# Patient Record
Sex: Male | Born: 1940 | Race: White | Hispanic: No | Marital: Married | Smoking: Never smoker
Health system: Southern US, Community
[De-identification: ages and names within clinical notes are randomized; demographics above are authoritative.]

## PROBLEM LIST (undated history)

## (undated) DIAGNOSIS — I480 Paroxysmal atrial fibrillation: Secondary | ICD-10-CM

## (undated) DIAGNOSIS — Z95810 Presence of automatic (implantable) cardiac defibrillator: Secondary | ICD-10-CM

## (undated) DIAGNOSIS — I255 Ischemic cardiomyopathy: Secondary | ICD-10-CM

## (undated) DIAGNOSIS — I5022 Chronic systolic (congestive) heart failure: Secondary | ICD-10-CM

## (undated) DIAGNOSIS — R0600 Dyspnea, unspecified: Secondary | ICD-10-CM

## (undated) DIAGNOSIS — E785 Hyperlipidemia, unspecified: Secondary | ICD-10-CM

## (undated) DIAGNOSIS — N189 Chronic kidney disease, unspecified: Secondary | ICD-10-CM

## (undated) DIAGNOSIS — I499 Cardiac arrhythmia, unspecified: Secondary | ICD-10-CM

## (undated) DIAGNOSIS — I472 Ventricular tachycardia, unspecified: Secondary | ICD-10-CM

## (undated) DIAGNOSIS — I209 Angina pectoris, unspecified: Secondary | ICD-10-CM

## (undated) DIAGNOSIS — M79606 Pain in leg, unspecified: Secondary | ICD-10-CM

## (undated) DIAGNOSIS — N400 Enlarged prostate without lower urinary tract symptoms: Secondary | ICD-10-CM

## (undated) DIAGNOSIS — I2510 Atherosclerotic heart disease of native coronary artery without angina pectoris: Secondary | ICD-10-CM

## (undated) DIAGNOSIS — M1990 Unspecified osteoarthritis, unspecified site: Secondary | ICD-10-CM

## (undated) DIAGNOSIS — I509 Heart failure, unspecified: Secondary | ICD-10-CM

## (undated) HISTORY — DX: Heart failure, unspecified: I50.9

## (undated) HISTORY — DX: Hyperlipidemia, unspecified: E78.5

## (undated) HISTORY — PX: APPENDECTOMY: SHX54

## (undated) HISTORY — DX: Pain in leg, unspecified: M79.606

## (undated) HISTORY — DX: Ventricular tachycardia: I47.2

## (undated) HISTORY — DX: Paroxysmal atrial fibrillation: I48.0

## (undated) HISTORY — PX: CORONARY ANGIOPLASTY: SHX604

## (undated) HISTORY — DX: Ischemic cardiomyopathy: I25.5

## (undated) HISTORY — PX: KNEE ARTHROSCOPY: SUR90

## (undated) HISTORY — DX: Atherosclerotic heart disease of native coronary artery without angina pectoris: I25.10

## (undated) HISTORY — DX: Benign prostatic hyperplasia without lower urinary tract symptoms: N40.0

## (undated) HISTORY — DX: Ventricular tachycardia, unspecified: I47.20

## (undated) HISTORY — DX: Chronic systolic (congestive) heart failure: I50.22

## (undated) HISTORY — PX: OTHER SURGICAL HISTORY: SHX169

---

## 1968-09-07 HISTORY — PX: APPENDECTOMY: SHX54

## 1999-05-15 ENCOUNTER — Ambulatory Visit (HOSPITAL_BASED_OUTPATIENT_CLINIC_OR_DEPARTMENT_OTHER): Admission: RE | Admit: 1999-05-15 | Discharge: 1999-05-15 | Payer: Self-pay | Admitting: *Deleted

## 1999-07-01 ENCOUNTER — Emergency Department (HOSPITAL_COMMUNITY): Admission: EM | Admit: 1999-07-01 | Discharge: 1999-07-01 | Payer: Self-pay | Admitting: Emergency Medicine

## 1999-10-28 ENCOUNTER — Encounter: Payer: Self-pay | Admitting: Cardiovascular Disease

## 1999-10-28 ENCOUNTER — Inpatient Hospital Stay (HOSPITAL_COMMUNITY): Admission: EM | Admit: 1999-10-28 | Discharge: 1999-10-31 | Payer: Self-pay | Admitting: Emergency Medicine

## 1999-11-02 ENCOUNTER — Inpatient Hospital Stay (HOSPITAL_COMMUNITY): Admission: EM | Admit: 1999-11-02 | Discharge: 1999-11-07 | Payer: Self-pay | Admitting: Emergency Medicine

## 1999-11-02 ENCOUNTER — Encounter: Payer: Self-pay | Admitting: Emergency Medicine

## 1999-11-05 ENCOUNTER — Encounter: Payer: Self-pay | Admitting: Cardiology

## 2000-12-10 ENCOUNTER — Encounter: Payer: Self-pay | Admitting: Cardiovascular Disease

## 2000-12-10 ENCOUNTER — Ambulatory Visit (HOSPITAL_COMMUNITY): Admission: RE | Admit: 2000-12-10 | Discharge: 2000-12-10 | Payer: Self-pay | Admitting: Cardiovascular Disease

## 2001-05-24 ENCOUNTER — Ambulatory Visit (HOSPITAL_BASED_OUTPATIENT_CLINIC_OR_DEPARTMENT_OTHER): Admission: RE | Admit: 2001-05-24 | Discharge: 2001-05-24 | Payer: Self-pay | Admitting: Orthopedic Surgery

## 2002-10-31 ENCOUNTER — Ambulatory Visit (HOSPITAL_COMMUNITY): Admission: RE | Admit: 2002-10-31 | Discharge: 2002-10-31 | Payer: Self-pay | Admitting: Cardiovascular Disease

## 2002-11-02 ENCOUNTER — Emergency Department (HOSPITAL_COMMUNITY): Admission: EM | Admit: 2002-11-02 | Discharge: 2002-11-02 | Payer: Self-pay | Admitting: Emergency Medicine

## 2002-11-07 ENCOUNTER — Inpatient Hospital Stay (HOSPITAL_COMMUNITY): Admission: AD | Admit: 2002-11-07 | Discharge: 2002-11-08 | Payer: Self-pay | Admitting: Cardiovascular Disease

## 2002-11-20 ENCOUNTER — Ambulatory Visit (HOSPITAL_COMMUNITY): Admission: RE | Admit: 2002-11-20 | Discharge: 2002-11-21 | Payer: Self-pay | Admitting: Internal Medicine

## 2002-11-20 ENCOUNTER — Encounter: Payer: Self-pay | Admitting: Internal Medicine

## 2002-11-21 ENCOUNTER — Encounter: Payer: Self-pay | Admitting: Internal Medicine

## 2003-07-02 ENCOUNTER — Inpatient Hospital Stay (HOSPITAL_COMMUNITY): Admission: EM | Admit: 2003-07-02 | Discharge: 2003-07-04 | Payer: Self-pay | Admitting: Emergency Medicine

## 2003-07-02 ENCOUNTER — Encounter: Payer: Self-pay | Admitting: Urology

## 2003-09-08 HISTORY — PX: CARDIAC CATHETERIZATION: SHX172

## 2003-10-19 ENCOUNTER — Ambulatory Visit (HOSPITAL_COMMUNITY): Admission: RE | Admit: 2003-10-19 | Discharge: 2003-10-20 | Payer: Self-pay | Admitting: Cardiovascular Disease

## 2004-06-23 ENCOUNTER — Ambulatory Visit (HOSPITAL_COMMUNITY): Admission: RE | Admit: 2004-06-23 | Discharge: 2004-06-23 | Payer: Self-pay | Admitting: Cardiovascular Disease

## 2004-10-20 ENCOUNTER — Ambulatory Visit: Payer: Self-pay | Admitting: Internal Medicine

## 2005-02-23 ENCOUNTER — Ambulatory Visit: Payer: Self-pay

## 2005-08-12 ENCOUNTER — Ambulatory Visit: Payer: Self-pay

## 2005-12-15 ENCOUNTER — Ambulatory Visit: Payer: Self-pay | Admitting: Neurology

## 2006-03-23 ENCOUNTER — Ambulatory Visit: Payer: Self-pay | Admitting: Internal Medicine

## 2006-09-20 ENCOUNTER — Ambulatory Visit: Payer: Self-pay | Admitting: Internal Medicine

## 2006-11-22 ENCOUNTER — Ambulatory Visit: Payer: Self-pay

## 2007-04-27 ENCOUNTER — Encounter (INDEPENDENT_AMBULATORY_CARE_PROVIDER_SITE_OTHER): Payer: Self-pay | Admitting: Urology

## 2009-01-08 ENCOUNTER — Encounter: Payer: Self-pay | Admitting: Internal Medicine

## 2009-01-11 ENCOUNTER — Telehealth (INDEPENDENT_AMBULATORY_CARE_PROVIDER_SITE_OTHER): Payer: Self-pay | Admitting: *Deleted

## 2009-01-14 DIAGNOSIS — I10 Essential (primary) hypertension: Secondary | ICD-10-CM | POA: Insufficient documentation

## 2009-01-15 ENCOUNTER — Ambulatory Visit: Payer: Self-pay

## 2009-01-15 ENCOUNTER — Ambulatory Visit: Payer: Self-pay | Admitting: Internal Medicine

## 2009-01-15 DIAGNOSIS — I495 Sick sinus syndrome: Secondary | ICD-10-CM

## 2009-01-23 ENCOUNTER — Telehealth: Payer: Self-pay | Admitting: Internal Medicine

## 2009-01-25 ENCOUNTER — Encounter: Payer: Self-pay | Admitting: Internal Medicine

## 2009-02-08 ENCOUNTER — Encounter: Payer: Self-pay | Admitting: Internal Medicine

## 2009-02-14 ENCOUNTER — Encounter: Payer: Self-pay | Admitting: Internal Medicine

## 2009-02-14 ENCOUNTER — Ambulatory Visit: Payer: Self-pay

## 2009-02-14 ENCOUNTER — Ambulatory Visit: Payer: Self-pay | Admitting: Internal Medicine

## 2009-02-14 LAB — CONVERTED CEMR LAB
Basophils Absolute: 0 10*3/uL (ref 0.0–0.1)
Calcium: 9.2 mg/dL (ref 8.4–10.5)
Eosinophils Relative: 2.7 % (ref 0.0–5.0)
GFR calc non Af Amer: 88.85 mL/min (ref >60)
Glucose, Bld: 91 mg/dL (ref 70–99)
Hemoglobin: 15 g/dL (ref 13.0–17.0)
INR: 2.1 — ABNORMAL HIGH (ref 0.8–1.0)
Lymphocytes Relative: 21.7 % (ref 12.0–46.0)
Monocytes Relative: 6.9 % (ref 3.0–12.0)
Neutro Abs: 3.4 10*3/uL (ref 1.4–7.7)
Platelets: 220 10*3/uL (ref 150.0–400.0)
Potassium: 4.3 meq/L (ref 3.5–5.1)
RDW: 12.5 % (ref 11.5–14.6)
Sodium: 140 meq/L (ref 135–145)
WBC: 4.8 10*3/uL (ref 4.5–10.5)
aPTT: 34 s — ABNORMAL HIGH (ref 21.7–28.8)

## 2009-02-15 ENCOUNTER — Inpatient Hospital Stay (HOSPITAL_COMMUNITY): Admission: RE | Admit: 2009-02-15 | Discharge: 2009-02-16 | Payer: Self-pay | Admitting: Internal Medicine

## 2009-02-15 ENCOUNTER — Ambulatory Visit: Payer: Self-pay | Admitting: Internal Medicine

## 2009-02-16 ENCOUNTER — Encounter: Payer: Self-pay | Admitting: Internal Medicine

## 2009-02-19 ENCOUNTER — Ambulatory Visit: Payer: Self-pay | Admitting: Internal Medicine

## 2009-02-26 ENCOUNTER — Ambulatory Visit: Payer: Self-pay | Admitting: Internal Medicine

## 2009-02-26 ENCOUNTER — Encounter: Payer: Self-pay | Admitting: Internal Medicine

## 2009-02-26 ENCOUNTER — Ambulatory Visit: Payer: Self-pay

## 2009-04-02 ENCOUNTER — Ambulatory Visit: Payer: Self-pay | Admitting: Internal Medicine

## 2009-06-04 ENCOUNTER — Encounter: Payer: Self-pay | Admitting: Internal Medicine

## 2009-06-04 ENCOUNTER — Ambulatory Visit: Payer: Self-pay | Admitting: Internal Medicine

## 2009-07-18 ENCOUNTER — Encounter (INDEPENDENT_AMBULATORY_CARE_PROVIDER_SITE_OTHER): Payer: Self-pay | Admitting: *Deleted

## 2009-08-20 ENCOUNTER — Encounter: Payer: Self-pay | Admitting: Internal Medicine

## 2009-08-20 ENCOUNTER — Ambulatory Visit: Payer: Self-pay | Admitting: Cardiovascular Disease

## 2009-08-20 ENCOUNTER — Ambulatory Visit (HOSPITAL_COMMUNITY): Admission: RE | Admit: 2009-08-20 | Discharge: 2009-08-20 | Payer: Self-pay | Admitting: Internal Medicine

## 2009-08-20 ENCOUNTER — Ambulatory Visit: Payer: Self-pay | Admitting: Internal Medicine

## 2010-02-25 ENCOUNTER — Ambulatory Visit: Payer: Self-pay | Admitting: Internal Medicine

## 2010-02-25 ENCOUNTER — Ambulatory Visit (HOSPITAL_COMMUNITY): Admission: RE | Admit: 2010-02-25 | Discharge: 2010-02-25 | Payer: Self-pay | Admitting: Internal Medicine

## 2010-02-25 ENCOUNTER — Ambulatory Visit: Payer: Self-pay

## 2010-02-25 ENCOUNTER — Encounter: Payer: Self-pay | Admitting: Internal Medicine

## 2010-02-25 DIAGNOSIS — I4891 Unspecified atrial fibrillation: Secondary | ICD-10-CM | POA: Insufficient documentation

## 2010-03-18 ENCOUNTER — Telehealth: Payer: Self-pay | Admitting: Internal Medicine

## 2010-05-08 ENCOUNTER — Ambulatory Visit: Payer: Self-pay | Admitting: Cardiovascular Disease

## 2010-06-04 ENCOUNTER — Ambulatory Visit: Payer: Self-pay | Admitting: Cardiovascular Disease

## 2010-06-26 ENCOUNTER — Ambulatory Visit: Payer: Self-pay | Admitting: Cardiovascular Disease

## 2010-07-21 ENCOUNTER — Ambulatory Visit: Payer: Self-pay | Admitting: Cardiovascular Disease

## 2010-07-21 ENCOUNTER — Encounter: Payer: Self-pay | Admitting: Internal Medicine

## 2010-08-18 ENCOUNTER — Encounter: Payer: Self-pay | Admitting: Internal Medicine

## 2010-08-19 ENCOUNTER — Ambulatory Visit: Payer: Self-pay | Admitting: Internal Medicine

## 2010-08-19 ENCOUNTER — Encounter: Payer: Self-pay | Admitting: Internal Medicine

## 2010-08-19 ENCOUNTER — Ambulatory Visit: Payer: Self-pay | Admitting: Cardiology

## 2010-09-02 ENCOUNTER — Ambulatory Visit: Payer: Self-pay | Admitting: Internal Medicine

## 2010-09-12 ENCOUNTER — Telehealth: Payer: Self-pay | Admitting: Internal Medicine

## 2010-10-02 ENCOUNTER — Ambulatory Visit: Payer: Self-pay | Admitting: Cardiology

## 2010-10-07 NOTE — Assessment & Plan Note (Signed)
Summary: 9 month rov/sl pt to have echo @ 9:30/jml   Referring Provider:  Delane Ginger   History of Present Illness: Mr. 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 He was status post ICD implantation initially as part of a Master protocol.  initially following ICD-CRT upgrade he did better. More recently he has noted some episodes of fatigue. There has been some peripheral edema as well as "puffiness in his face." There has been no chest pain.  He has been involved in a lipid trial at [facility]. The trial has been terminated by the [employer]. because of lack of benefit    Current Medications (verified): 1)  Lisinopril 10 Mg Tabs (Lisinopril) .... Take One Half Tab Once Daily 2)  Carvedilol 12.5 Mg Tabs (Carvedilol) .... Take One Tablet Two Times A Day 3)  Coumadin 2 Mg Tabs (Warfarin Sodium) .... Take As Directed 4)  Zocor 40 Mg Tabs (Simvastatin) .... Take One Tablet Qd 5)  Adult Aspirin Ec Low Strength 81 Mg Tbec (Aspirin) .... Take One Tablet Once Daily 6)  Glucosamine Chondroitin Adv  Tabs (Misc Natural Products) .... Take One Tablet Once Daily 7)  Multivitamins  Tabs (Multiple Vitamin) .... Take One Tablet Once Daily 8)  Fish Oil  Oil (Fish Oil) .... Take Once Daily 9)  Cvs Daily Probiotic  Caps (Probiotic Product) .... Once Daily 10)  Jalyn 0.5-0.4 Mg Caps (Dutasteride-Tamsulosin Hcl) .... One Daily  Allergies (verified): 1)  ! Benadryl 2)  ! Sudafed  Past History:  Past Medical History: Last updated: 01/15/2009  1.  History of coronary artery disease.  He is status post PTCA and stenting      of his left anterior descending artery.  He is status post restenting of      his LAD in February 2005.  2.  Congestive heart failure due to an anterior wall myocardial infarction.  3.  History of ICD placement.  4.  Hypertension.  5.  Hyperlipidemia.  6.  Prostatic enlargement.  7. right ear deafness  8.  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  Alcohol Use - no Married  Regular Exercise - no  Vital Signs:  Patient profile:   70 year old male Height:      72 inches Weight:      215 pounds Pulse rate:   62 / minute Pulse rhythm:   regular BP sitting:   130 / 79  (right arm) Cuff size:   regular  Vitals Entered By: Judithe Modest CMA (February 25, 2010 10:41 AM)  Physical Exam  General:  The patient was alert and oriented in no acute distress. HEENT Normal.  Neck veins were flat, carotids were brisk.  Lungs were clear.  Heart sounds were regular without murmurs or gallops.  Abdomen was soft with active bowel sounds. There is no clubbing cyanosis; 1+ edema Skin Warm and dry     ICD Specifications Following MD:  Sherryl Manges, MD     Referring MD:  [facility] ICD Vendor:  Medtronic     ICD Model Number:  [phone]     ICD Serial Number:  [id number]  ICD DOI:  02/15/2009     ICD Implanting MD:  Sherryl Manges, MD Research Study Name aCRT  Lead 1:    Location: RA     DOI: 02/15/2009     Model #: [id number]     Serial #: [id number]     Status: active Lead 2:    Location: RV     DOI: 11/20/2002     Model #: 6948     Serial #: [id number]     Status: active Lead 3:    Location: LV     DOI: 02/15/2009     Model #: 0938     Serial #: [id number]     Status: active  Indications::  CM   ICD Follow Up Remote Check?  No Battery Voltage:  3.14 V     Charge Time:  9.1 seconds     Underlying rhythm:  SR ICD Dependent:  No       ICD Device Measurements Atrium:  Amplitude: 2.0 mV, Impedance: 475 ohms, Threshold: 0.5 V at 0.4  msec Right Ventricle:  Amplitude: 11.4 mV, Impedance: 437 ohms, Threshold: 1.0 V at 0.4 msec Left Ventricle:  Impedance: 646 ohms, Threshold: 0.625 V at 0.4 msec Shock Impedance: 46/60 ohms   Episodes MS Episodes:  0     Shock:  0     ATP:  0     Nonsustained:  0     Atrial Pacing:  74.4%     Ventricular Pacing:  98.4%  Brady Parameters Mode DDD     Lower Rate Limit:  60     Upper Rate Limit 130 PAV 170     Sensed AV Delay:  110  Tachy Zones VF:  188     VT:  OFF     VT1:  OFF     Tech Comments:  Checked by industry as part of aCRT protocol. Gypsy Balsam RN BSN  February 25, 2010 10:56 AM   Impression & Recommendations:  Problem # 1:  HYPERLIPIDEMIA (ICD-272.4) the patient's trial [facility] has been terminated. We will discontinue his Niaspan and Zocor and resume Lipitor 40 mg The following medications were removed from the medication list:    Niaspan 1000 Mg Cr-tabs (Niacin (antihyperlipidemic)) .[name]... Take one tablet two times a day His updated medication list for this problem includes:    Lipitor 40 Mg Tabs (Atorvastatin calcium) .[name]... 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 He is on Coumadin. At some point we should consider discontinuing his aspirin  Problem # 4:  SYSTOLIC HEART FAILURE, CHRONIC (ICD-428.22) his congestive status is much improved at baseline although there's been a recent recurrence of edema. Interestingly his optivol index demonstrates no fluid accumulation in the setting of his edema. We will plan to put him on a low dose of diuretic for one weeks time. He is to let us know how it is his feeling at the end of this. We'll otherwise plan to see him again in 3  month His updated medication list for this problem includes:    Lisinopril 10 Mg Tabs (Lisinopril) .[name]... Take one half tab once daily    Carvedilol 12.5 Mg Tabs (Carvedilol) .[name]... Take one tablet two times  a day    Coumadin 2 Mg Tabs (Warfarin sodium) .[name]... Take as directed    Adult Aspirin Ec Low Strength 81 Mg Tbec (Aspirin) .[name]... Take one tablet once daily    Furosemide 20 Mg Tabs (Furosemide) .[name]... Take 1 tablet by mouth daily for 3 days then 1 tablet every other day for 6 days then as needed  Patient Instructions: 1)  Your physician has recommended you make the following change in your medication: Take Furosemide 20mg  1 tablet daily for 3 days then 1 tablet every other day for 6 days then as needed. 2)  Your physician wants you to follow-up in: 6 months with Dr Graciela Husbands.  You will receive a reminder letter in the mail two months in advance. If you don't receive a letter, please call our office to schedule the follow-up appointment. 3)  Call us in 10 days to let us know how you are doing. Prescriptions: LIPITOR 40 MG TABS (ATORVASTATIN CALCIUM) Take 1 tablet by mouth once a day  #90 x 3   Entered by:   Optometrist BSN   Authorized by:   Nathen May, MD, [facility]   Signed by:   Gypsy Balsam RN BSN on 02/25/2010   Method used:   Electronically to        [facility]* (retail)             ,          Ph: [phone]       Fax: [phone]   RxID:   [id number] COUMADIN 2 MG TABS (WARFARIN SODIUM) take as directed  #30 x 2   Entered by:   Optometrist BSN   Authorized by:   Nathen May, MD, [facility]   Signed by:   Gypsy Balsam RN BSN on 02/25/2010   Method used:   Electronically to        [facility]* (retail)       [address] [place], [place]  [address]       Ph: [phone] or [phone]       Fax: [phone]   RxID:   [id number] FUROSEMIDE 20 MG TABS (FUROSEMIDE) Take 1 tablet by mouth  daily for 3 days then 1 tablet every other day for 6 days then as needed  #30 x 0   Entered by:   Optometrist BSN   Authorized by:   Nathen May, MD, [facility]   Signed by:   Gypsy Balsam RN BSN on 02/25/2010   Method used:   Electronically to        [facility]* (retail)       [address] [place], [place]  [address]       Ph: [phone] or [phone]       Fax: [phone]   RxID:   [id number] LIPITOR 40 MG TABS (ATORVASTATIN CALCIUM) Take 1 tablet by mouth once a day  #30 x 11   Entered by:   Optometrist BSN   Authorized by:   Nathen May, MD, [facility]   Signed by:   Gypsy Balsam RN BSN on 02/25/2010   Method used:   Electronically to        [facility]* (retail)       [address] [place], [place]  [address]       Ph: [phone] or [phone]       Fax: [phone]   RxID:   [id number]

## 2010-10-07 NOTE — Progress Notes (Signed)
Summary: pt would like to give an update  Phone Note Call from Patient Call back at Home Phone [phone]   Caller: Patient Reason for Call: Talk to Nurse, Talk to Doctor Summary of Call: pt in study and was to call office in 10days to give an update  Initial call taken by: Omer Jack,  March 18, 2010 11:18 AM  Follow-up for Phone Call        FYI                                                                                                  PER PT  CALLED IN WITH UPDATE FEELS FINE WEIGHT GOOD NO SOB  TAKING FUROSEMIDE 20 MG every other day .AT THIS TIME. Follow-up by: Scherrie Bateman, LPN,  March 18, 2010 11:56 AM  Additional Follow-up for Phone Call Additional follow up Details #1::        greatr Additional Follow-up by: Nathen May, MD, [facility],  March 20, 2010 9:03 AM

## 2010-10-09 NOTE — Progress Notes (Signed)
Summary: CHEST PAIN AND QUESTION RE DEVICE  Phone Note Call from Patient Call back at Home Phone [phone]   Caller: Patient Reason for Call: Talk to Nurse Summary of Call: PT HAS HAD CHEST PAIN AND PAIN IN THE RIGHT ARM. PT WOULD LIKE TO KNOW IF SOMEONE CAN CHECK HIS MONITOR TO SEE IF SOMETHING IS GOING ON. Initial call taken by: Roe Coombs,  September 12, 2010 4:24 PM  Follow-up for Phone Call        spoke w/pt--wanted to know if there had been any episodes on device.  no alerts thru carelink.  informed pt if chest pn continues needs to be seen by primary cardiologist (Dr Elease Hashimoto).  pt agrees. Vella Kohler  September 12, 2010 5:00 PM  Additional Follow-up for Phone Call Additional follow up Details #1::        agree Additional Follow-up by: Nathen May, MD, [facility],  September 15, 2010 1:11 PM

## 2010-10-09 NOTE — Progress Notes (Signed)
  Phone Note Call from Patient Call back at [facility] Phone [phone]   Caller: Patient Reason for Call: Talk to Nurse Summary of Call: PT HAS HAD CHEST PAIN AND PAIN IN THE RIGHT ARM. PT WOULD LIKE TO KNOW IF SOMEONE CAN CHECK HIS MONITOR TO SEE IF SOMETHING IS GOING ON.  Initial call taken by: Roe Coombs,  September 12, 2010 4:23 PM

## 2010-10-09 NOTE — Letter (Signed)
Summary: [facility] Progress Note   [facility] Progress Note   Imported By: Roderic Ovens 08/19/2010 15:47:55  _____________________________________________________________________  External Attachment:    Type:   Image     Comment:   External Document

## 2010-10-09 NOTE — Miscellaneous (Signed)
  Clinical Lists Changes  Observations: Added new observation of NUCLEAR NOS: - Left ventricle: The cavity size was mildly dilated. Systolic       function was moderately to severely reduced. The estimated       ejection fraction was in the range of 30% to 35%. There is severe       hypokinesis and scarring of the anteroseptal myocardium. There is       hypokinesis of the inferoseptal myocardium. There is akinesis of       the apical myocardium. There is hypokinesis of the anterior and       inferior myocardium. Doppler parameters are consistent with       abnormal left ventricular relaxation (grade 1 diastolic       dysfunction).     - Aortic valve: Mild regurgitation.     - Mitral valve: Mild regurgitation.     - Left atrium: The atrium was moderately dilated.     - Right atrium: The atrium was mildly dilated. (02/25/2010 14:36)      Nuclear Study  Procedure date:  02/25/2010  Findings:      - Left ventricle: The cavity size was mildly dilated. Systolic       function was moderately to severely reduced. The estimated       ejection fraction was in the range of 30% to 35%. There is severe       hypokinesis and scarring of the anteroseptal myocardium. There is       hypokinesis of the inferoseptal myocardium. There is akinesis of       the apical myocardium. There is hypokinesis of the anterior and       inferior myocardium. Doppler parameters are consistent with       abnormal left ventricular relaxation (grade 1 diastolic       dysfunction).     - Aortic valve: Mild regurgitation.     - Mitral valve: Mild regurgitation.     - Left atrium: The atrium was moderately dilated.     - Right atrium: The atrium was mildly dilated.

## 2010-10-09 NOTE — Assessment & Plan Note (Signed)
Summary: icd check/medtronic   Visit Type:  ICD-Medtronic Referring Ksenia Kunz:  Delane Ginger  CC:  no complaints.  History of Present Illness: Mr. 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 He was status post ICD implantation initially as part of a Master protocol.  initially following ICD-CRT upgrade he did better.His currently part of the a  CRT trial.  he has been doing really pretty well from a functional point of view. The patient denies chest pain, edema or palpitations; there is shortness of breath at high altitudes and high exertion   Problems Prior to Update: 1)  Atrial Fibrillation  (ICD-427.31) 2)  Implantable Defibrillator Crt-mdt  (ICD-V45.02) 3)  Sinus Bradycardia  (ICD-427.81) 4)  Cardiomyopathy, Ischemic  (ICD-414.8) 5)  Systolic Heart Failure, Chronic  (ICD-428.22) 6)  Hyperlipidemia  (ICD-272.4) 7)  Essential Hypertension, Benign  (ICD-401.1)  Current Medications (verified): 1)  Lisinopril 10 Mg Tabs (Lisinopril) .... Take One Half Tab Once Daily 2)  Carvedilol 12.5 Mg Tabs (Carvedilol) .... Take One Tablet Two Times A Day 3)  Coumadin 2 Mg Tabs (Warfarin Sodium) .... Take As Directed 4)  Lipitor 40 Mg Tabs (Atorvastatin Calcium) .... Take 1 Tablet By Mouth Once A Day 5)  Adult Aspirin Ec Low Strength 81 Mg Tbec (Aspirin) .... Take One Tablet Once Daily 6)  Glucosamine Chondroitin Adv  Tabs (Misc Natural Products) .... Take One Tablet Once Daily 7)  Multivitamins  Tabs (Multiple Vitamin) .... Take One Tablet Once Daily 8)  Fish Oil  Oil (Fish Oil) .... Take Once Daily 9)  Cvs Daily Probiotic  Caps (Probiotic Product) .... Once Daily 10)  Jalyn 0.5-0.4 Mg Caps (Dutasteride-Tamsulosin Hcl) .... One Daily 11)  Antihistamine Decongestant 2.5-60 Mg Tabs (Triprolidine-Pseudoephedrine) .... As Needed  Allergies (verified): 1)  ! Benadryl 2)  ! Sudafed  Past History:  Past Medical History: Last  updated: 01/15/2009  1.  History of coronary artery disease.  He is status post PTCA and stenting      of his left anterior descending artery.  He is status post restenting of      his LAD in February 2005.  2.  Congestive heart failure due to an anterior wall myocardial infarction.  3.  History of ICD placement.  4.  Hypertension.  5.  Hyperlipidemia.  6.  Prostatic enlargement.  7. right ear deafness  8.  sleep disordered breathing with new onset daytime somnolence  Medtronic, model [phone] Maximo, date of implant was November 20, 2002  Past Surgical History: Last updated: 01/14/2009 AICD Implantation--Medtronic,model #9604 Maximo-- November 20, 2002  Family History: Last updated: 01/15/2009 unremarkable Negative FH of Diabetes, Hypertension, or Coronary Artery Disease  Social History: Last updated: 01/15/2009 Tobacco Use - No.  Alcohol Use - no Married  Regular Exercise - no  Risk Factors: Exercise: no (01/15/2009)  Risk Factors: Smoking Status: never (01/14/2009)  Vital Signs:  Patient profile:   70 year old male Height:      72 inches Weight:      224 pounds BMI:     30.49 Pulse rate:   60 / minute BP sitting:   114 / 66  (left arm) Cuff size:   regular  Vitals Entered By: Caralee Ates CMA (August 19, 2010 3:06 PM)  Physical Exam  General:  The patient was alert and oriented in no acute distress. HEENT Normal.  Neck veins were flat, carotids were brisk.  Lungs were clear.  Heart sounds were regular without murmurs or gallops.  Abdomen was soft with active bowel sounds. There is no clubbing cyanosis or edema. Skin Warm and dry     ICD Specifications Following MD:  Sherryl Manges, MD     Referring MD:  [facility] ICD Vendor:  Medtronic     ICD Model Number:  [phone]     ICD Serial Number:  [id number] ICD DOI:  02/15/2009     ICD Implanting MD:  Sherryl Manges, MD Research Study Name aCRT  Lead 1:    Location: RA     DOI: 02/15/2009     Model #: 1478     Serial #:  [id number]     Status: active Lead 2:    Location: RV     DOI: 11/20/2002     Model #: 5784     Serial #: [id number]     Status: active Lead 3:    Location: LV     DOI: 02/15/2009     Model #: 1324     Serial #: [id number]     Status: active  Indications::  CM   ICD Follow Up Remote Check?  No Battery Voltage:  3.10 V     Charge Time:  9.4 seconds     Underlying rhythm:  SR ICD Dependent:  No       ICD Device Measurements Atrium:  Amplitude: 2.5 mV, Impedance: 475 ohms, Threshold: 0.75 V at 0.4 msec Right Ventricle:  Amplitude: 13 mV, Impedance: 475 ohms, Threshold: 0.75 V at 0.4 msec Left Ventricle:  Impedance: 741 ohms, Threshold: 0.75 V at 0.4 msec Shock Impedance: 47/58 ohms   Episodes MS Episodes:  1     Percent Mode Switch:  <0.1%     Coumadin:  Yes Shock:  0     ATP:  0     Nonsustained:  2     Atrial Pacing:  80.2%     Ventricular Pacing:  98.3%  Brady Parameters Mode DDD     Lower Rate Limit:  60     Upper Rate Limit 130 PAV 170     Sensed AV Delay:  110  Tachy Zones VF:  188     VT:  OFF     VT1:  OFF     Next Cardiology Appt Due:  11/06/2010 Tech Comments:  No parameter changes.  Device function normal.  NSVT episodes 8-13 beats.   Checked by [employer].  ROV 3 months  clinic. Altha Harm, LPN  August 19, 2010 3:45 PM   Impression & Recommendations:  Problem # 1:  ATRIAL FIBRILLATION (ICD-427.31) the patient had intercurrent atrial fibrillation. He is on Coumadin. I would ask Dr. Jamse Mead to consider discontinuing his aspirin His updated medication list for this problem includes:    Carvedilol 12.5 Mg Tabs (Carvedilol) .[name]... Take one tablet two times a day    Coumadin 2 Mg Tabs (Warfarin sodium) .[name]... Take as directed    Adult Aspirin Ec Low Strength 81 Mg Tbec (Aspirin) .[name]... Take one tablet once daily  Problem # 2:  SINUS BRADYCARDIA (ICD-427.81) his resting heart rate is slow. He is paced at a lower rate limit. We took him for a walk on the [facility] and he generated a  heart rate of 85-90 and 3 flights of stairs and he is able to generate a heart rate in the 105. He is not chronotropically incompetent His updated medication list for this problem includes:    Lisinopril 10 Mg  Tabs (Lisinopril) .[name]... Take one half tab once daily    Carvedilol 12.5 Mg Tabs (Carvedilol) .[name]... Take one tablet two times a day    Coumadin 2 Mg Tabs (Warfarin sodium) .[name]... Take as directed    Adult Aspirin Ec Low Strength 81 Mg Tbec (Aspirin) .[name]... Take one tablet once daily  Problem # 3:  CARDIOMYOPATHY, ISCHEMIC (ICD-414.8) I asked Dr. Jamse Mead  consider whether he is appropriately a candidate for Aldactone The following medications were removed from the medication list:    Furosemide 20 Mg Tabs (Furosemide) .[name]... Take 1 tablet by mouth daily for 3 days then 1 tablet every other day for 6 days then as needed His updated medication list for this problem includes:    Lisinopril 10 Mg Tabs (Lisinopril) .[name]... Take one half tab once daily    Carvedilol 12.5 Mg Tabs (Carvedilol) .[name]... Take one tablet two times a day    Coumadin 2 Mg Tabs (Warfarin sodium) .[name]... Take as directed    Adult Aspirin Ec Low Strength 81 Mg Tbec (Aspirin) .[name]... Take one tablet once daily  Problem # 4:  SYSTOLIC HEART FAILURE, CHRONIC (ICD-428.22) stable on his current meds The following medications were removed from the medication list:    Furosemide 20 Mg Tabs (Furosemide) .[name]... Take 1 tablet by mouth daily for 3 days then 1 tablet every other day for 6 days then as needed His updated medication list for this problem includes:    Lisinopril 10 Mg Tabs (Lisinopril) .[name]... Take one half tab once daily    Carvedilol 12.5 Mg Tabs (Carvedilol) .[name]... Take one tablet two times a day    Coumadin 2 Mg Tabs (Warfarin sodium) .[name]... Take as directed    Adult Aspirin Ec Low Strength 81 Mg Tbec (Aspirin) .[name]... Take one tablet once daily  Patient Instructions: 1)  Your physician recommends that you schedule a follow-up  appointment in: To be determined by research 2)  Your physician recommends that you continue on your current medications as directed. Please refer to the Current Medication list given to you today.  Appended Document: icd check/medtronic elective cardiogram demonstrated AV pacing

## 2010-10-21 ENCOUNTER — Other Ambulatory Visit (INDEPENDENT_AMBULATORY_CARE_PROVIDER_SITE_OTHER): Payer: Medicare Other

## 2010-10-21 DIAGNOSIS — Z7901 Long term (current) use of anticoagulants: Secondary | ICD-10-CM

## 2010-10-21 DIAGNOSIS — I4891 Unspecified atrial fibrillation: Secondary | ICD-10-CM

## 2010-11-20 ENCOUNTER — Encounter (INDEPENDENT_AMBULATORY_CARE_PROVIDER_SITE_OTHER): Payer: Medicare Other

## 2010-11-20 DIAGNOSIS — Z7901 Long term (current) use of anticoagulants: Secondary | ICD-10-CM

## 2010-11-20 DIAGNOSIS — E789 Disorder of lipoprotein metabolism, unspecified: Secondary | ICD-10-CM

## 2010-12-02 ENCOUNTER — Telehealth: Payer: Self-pay | Admitting: *Deleted

## 2010-12-02 NOTE — Telephone Encounter (Signed)
Message copied by Erskine Squibb on Tue Dec 02, 2010 12:50 PM ------      Message from: Sheffield Slider      Created: Tue Dec 02, 2010  8:16 AM      Regarding: CHEST EPISODES/MONITOR/NAHSER      Contact: [phone]       PATIENT SAID LAST COUPLE OF DAYS HAD EPISODES OF LEFT CP, WANTS TO KNOW IF HIS MONITOR SHOWED ANYTHING, SAID JUST HAVE [name] CHECK AND CALL HIM BACK. 817AM

## 2010-12-02 NOTE — Telephone Encounter (Signed)
Pt calling C/O "sharp quick pains";  States they are not lasting and occurred yesterday. Pt. States this is not the same pain as prior heart pains.  Per Dr. Elease Hashimoto: sounds ok.  Continue to monitor and pains return, call back to the office.  Pt verbalized an understanding.

## 2010-12-15 LAB — PROTIME-INR: Prothrombin Time: 21.3 seconds — ABNORMAL HIGH (ref 11.6–15.2)

## 2010-12-15 LAB — GLUCOSE, CAPILLARY: Glucose-Capillary: 197 mg/dL — ABNORMAL HIGH (ref 70–99)

## 2010-12-18 ENCOUNTER — Telehealth: Payer: Self-pay | Admitting: Cardiovascular Disease

## 2010-12-18 ENCOUNTER — Ambulatory Visit (INDEPENDENT_AMBULATORY_CARE_PROVIDER_SITE_OTHER): Payer: Medicare Other | Admitting: *Deleted

## 2010-12-18 DIAGNOSIS — Z7901 Long term (current) use of anticoagulants: Secondary | ICD-10-CM | POA: Insufficient documentation

## 2010-12-18 NOTE — Telephone Encounter (Signed)
Pt had questions and concerns with pacer, Dr Elease Hashimoto suggested app with pacer clinic, app made and pt declines. App cancelled. Alfonso Ramus RN

## 2010-12-25 ENCOUNTER — Encounter: Payer: Medicare Other | Admitting: *Deleted

## 2011-01-05 ENCOUNTER — Encounter: Payer: Medicare Other | Admitting: *Deleted

## 2011-01-07 ENCOUNTER — Telehealth: Payer: Self-pay | Admitting: Cardiovascular Disease

## 2011-01-07 NOTE — Telephone Encounter (Signed)
I switched the INR appointment for [name] from 8:15am on 01/16/11 to 1:45pm on 01/19/11 at his request.

## 2011-01-13 ENCOUNTER — Other Ambulatory Visit: Payer: Self-pay | Admitting: *Deleted

## 2011-01-13 ENCOUNTER — Encounter: Payer: Self-pay | Admitting: Cardiovascular Disease

## 2011-01-13 ENCOUNTER — Other Ambulatory Visit: Payer: Self-pay | Admitting: Cardiovascular Disease

## 2011-01-13 DIAGNOSIS — I25810 Atherosclerosis of coronary artery bypass graft(s) without angina pectoris: Secondary | ICD-10-CM | POA: Insufficient documentation

## 2011-01-13 DIAGNOSIS — I2510 Atherosclerotic heart disease of native coronary artery without angina pectoris: Secondary | ICD-10-CM | POA: Insufficient documentation

## 2011-01-13 DIAGNOSIS — N400 Enlarged prostate without lower urinary tract symptoms: Secondary | ICD-10-CM | POA: Insufficient documentation

## 2011-01-13 DIAGNOSIS — E785 Hyperlipidemia, unspecified: Secondary | ICD-10-CM | POA: Insufficient documentation

## 2011-01-13 DIAGNOSIS — I5042 Chronic combined systolic (congestive) and diastolic (congestive) heart failure: Secondary | ICD-10-CM | POA: Insufficient documentation

## 2011-01-13 DIAGNOSIS — I5022 Chronic systolic (congestive) heart failure: Secondary | ICD-10-CM | POA: Insufficient documentation

## 2011-01-13 DIAGNOSIS — M109 Gout, unspecified: Secondary | ICD-10-CM | POA: Insufficient documentation

## 2011-01-13 NOTE — Telephone Encounter (Signed)
Fax received from pharmacy. Refill completed. Jodette Guadalupe Nickless RN  

## 2011-01-16 ENCOUNTER — Encounter: Payer: Medicare Other | Admitting: *Deleted

## 2011-01-19 ENCOUNTER — Encounter: Payer: Self-pay | Admitting: Cardiovascular Disease

## 2011-01-19 ENCOUNTER — Ambulatory Visit (INDEPENDENT_AMBULATORY_CARE_PROVIDER_SITE_OTHER): Payer: Medicare Other | Admitting: *Deleted

## 2011-01-19 ENCOUNTER — Ambulatory Visit (INDEPENDENT_AMBULATORY_CARE_PROVIDER_SITE_OTHER): Payer: Medicare Other | Admitting: Cardiovascular Disease

## 2011-01-19 DIAGNOSIS — Z7901 Long term (current) use of anticoagulants: Secondary | ICD-10-CM

## 2011-01-19 DIAGNOSIS — I509 Heart failure, unspecified: Secondary | ICD-10-CM

## 2011-01-19 NOTE — Assessment & Plan Note (Signed)
[name] has been doing fairly well but has had some dizziness with standing.  Will have him hold his Lisinopril for 1-2 weeks.  I will see him in 3 months for follow up visit.

## 2011-01-19 NOTE — Progress Notes (Signed)
[name] Date of Birth  November 10, 1940 [facility] / [facility] [address]     [address] [place], [place]  [address] [phone]  Fax  [phone]  History of Present Illness:  Pt complains of dizziness with standing.  Never has dizziness seated.  No significant worsening of his dyspnea.  No chest pain. Denies syncope.  Current Outpatient Prescriptions on File Prior to Visit  Medication Sig Dispense Refill  . aspirin 81 MG EC tablet Take 81 mg by mouth daily.        . carvedilol (COREG) 12.5 MG tablet Take 12.5 mg by mouth 2 (two) times daily with a meal.        . glucosamine-chondroitin 500-400 MG tablet Take 1 tablet by mouth daily.        [name] lisinopril (PRINIVIL,ZESTRIL) 5 MG tablet Take 5 mg by mouth daily.        . Multiple Vitamin (MULTIVITAMIN) tablet Take 1 tablet by mouth daily.        . Omega-3 Fatty Acids (FISH OIL) 1200 MG CAPS Take by mouth 2 (two) times daily.        [name] warfarin (COUMADIN) 5 MG tablet TAKE AS DIRECTED  90 tablet  2  . DISCONTD: Dutasteride-Tamsulosin HCl (JALYN) 0.5-0.4 MG CAPS Take by mouth daily.        [name] DISCONTD: phenazopyridine (PYRIDIUM) 200 MG tablet Take 200 mg by mouth 3 (three) times daily as needed.        [name] DISCONTD: simvastatin (ZOCOR) 40 MG tablet Take 40 mg by mouth at bedtime.        [name] DISCONTD: spironolactone (ALDACTONE) 25 MG tablet Take 25 mg by mouth daily.          Allergies  Allergen Reactions  . Analgesic (Aspirin-Caffeine)     Bladder shut down  . Diphenhydramine Hcl   . Pseudoephedrine     Past Medical History  Diagnosis Date  . Dyslipidemia   . Gout   . BPH (benign prostatic hypertrophy)     SEVERE  . CHF (congestive heart failure)     EJECTION FRACTION 30-35%  . Coronary artery disease     STATUS POST ANTERIOR WALL MYOCARDIAL INFARCTION. HE IS STATUS POST PTCA AND STENTING OF HIS LAD. HIS INITIAL SET WAS COMPLICATED BY SUBACUTE THROMBOSIS RESULTING IN A LARGE ANTERIOR WALL MYOCARDIAL  INFARCTIONAND SUDSEQUENT CHF    Past Surgical History  Procedure Date  . Insert / replace / remove pacemaker     STATUS POST BIVENTRICULAR PACEMAKER/AICD  . Cardiac catheterization 2005    History  Smoking status  . Never Smoker   Smokeless tobacco  . Not on file    History  Alcohol Use No    No family history on file.  Reviw of Systems:  Reviewed in the HPI.  All other systems are negative.  Physical Exam: BP 98/70  Pulse 62  Ht 6' (1.829 m)  Wt 220 lb 12.8 oz (100.154 kg)  BMI 29.95 kg/m2 The patient is alert and oriented x 3.  The mood and affect are normal.  The skin is warm and dry.  Color is normal.  The HEENT exam reveals that the sclera are nonicteric.  The mucous membranes are moist.  The carotids are 2+ without bruits.  There is no thyromegaly.  There is no JVD.  The lungs are clear.  The chest wall is non tender.  The heart exam reveals a regular rate with a normal  S1 and S2.  There are no murmurs, gallops, or rubs.  The PMI is not displaced.   Abdominal exam reveals good bowel sounds.  There is no guarding or rebound.  There is no hepatosplenomegaly or tenderness.  There are no masses.  Exam of the legs reveal no clubbing, cyanosis, or edema.  The legs are without rashes.  The distal pulses are intact.  Cranial nerves II - XII are intact.  Motor and sensory functions are intact.  The gait is normal.  Assessment / Plan:

## 2011-01-19 NOTE — Patient Instructions (Signed)
Hold Lisinopril for 1-2 weeks.  Restart when you are feeling better.

## 2011-01-20 NOTE — Discharge Summary (Signed)
[name], [name] NO.:  [id number]   MEDICAL RECORD NO.:  [id number]          PATIENT TYPE:  INP   LOCATION:  4733                         FACILITY:  [facility]   PHYSICIAN:  Doylene Canning. Ladona Ridgel, MD    DATE OF BIRTH:  03-01-1941   DATE OF ADMISSION:  02/15/2009  DATE OF DISCHARGE:  02/16/2009                               DISCHARGE SUMMARY   CARDIOLOGIST:  Vesta Mixer, M.D.   ELECTROPHYSIOLOGIST:  Duke Salvia, MD, [facility]   PRIMARY CARE PHYSICIAN:  Windle Guard, M.D.   REASON FOR ADMISSION:  Upgrade to a CRT device.   DISCHARGE DIAGNOSES:  1. Status post upgrade of ICD to CRT-D device.  2. Ischemic cardiomyopathy with an EF of 20%.  3. Chronic Coumadin therapy secondary to apical akinesis.  4. Coronary artery disease status post prior anterior apical      myocardial infarction.  5. Hypertension.  6. Hyperlipidemia.  7. Chronic systolic congestive heart failure.  8. History of sinus bradycardia.   PROCEDURES PERFORMED:  CRT upgrade by Dr. Sherryl Manges, February 15, 2009.  Please see the dictated note for complete details.   ADMISSION HISTORY:  [name] was evaluated by Dr. Graciela Husbands on Jan 15, 2009,  for upgrade of his ICD to a CRT device.  He was eventually set up for  the procedure and came to [facility] on February 15, 2009.   HOSPITAL COURSE:  The patient underwent the procedure as noted above by  Dr. Graciela Husbands on February 15, 2009.  The procedure was successful.  The patient  had no immediate complications.  His device was interrogated on the  morning of February 16, 2009, and seemed to be functioning appropriately.  His followup chest x-ray on February 16, 2009, demonstrated a left AICD  revision without pneumothorax or other apparent complication.  He was  evaluated by Dr. Ladona Ridgel who felt he was ready for discharge to home.  He  was discharged home in stable condition.   LABS AND ANCILLARY DATA:  Hemoglobin 15, INR at discharge 1.7, potassium  4.3, creatinine  0.9.   DISCHARGE MEDICATIONS:  1. Lisinopril 10 mg daily.  2. Carvedilol 6.25 mg b.i.d.  3. Niaspan 1000 mg b.i.d.  4. Coumadin as directed.  5. Zocor 40 mg q.h.s.  6. Aspirin 81 mg daily.   ALLERGIES:  BENADRYL, SUDAFED, ANTIHISTAMINES.   DIET:  Low-fat, low-sodium diet.   ACTIVITY:  He has been provided with a supplemental sheet for post  device procedures.   WOUND CARE:  He is to keep his wound dry for a week and allow the Steri-  Strips to fall off on their own.  He has been asked to call for any  swelling, bruising or discharge from wound site or fever over 101  degrees Fahrenheit.   FOLLOWUP:  1. The patient should follow up with the pacer clinic in 2 weeks and      our office will contact him with an appointment.  2. He will see Dr. Graciela Husbands in 3 months and the office will contact him  with an appointment.  3. He should follow up with Dr. Elease Hashimoto as directed.  4. He should follow up to have his Coumadin checked early next week      either Monday June 14 or Tuesday June 15 for followup as his INR at      discharge was 1.7.   Total physician PA time greater than 30 minutes on his discharge.      Tereso Newcomer, PA-C      Doylene Canning. Ladona Ridgel, MD  Electronically Signed    SW/MEDQ  D:  02/16/2009  T:  02/16/2009  Job:  [id number]   cc:   Vesta Mixer, M.D.  Windle Guard, M.D.

## 2011-01-20 NOTE — Op Note (Signed)
NAMELAMARCUS, SPIRA NO.:  [id number]   MEDICAL RECORD NO.:  [id number]          PATIENT TYPE:  INP   LOCATION:  4733                         FACILITY:  [facility]   PHYSICIAN:  Duke Salvia, MD, FACCDATE OF BIRTH:  September 17, 1940   DATE OF PROCEDURE:  DATE OF DISCHARGE:                               OPERATIVE REPORT   PREOPERATIVE DIAGNOSES:  Congestive heart failure, previously implanted  ICD with ischemic cardiomyopathy.   POSTOPERATIVE DIAGNOSES:  Congestive heart failure, previously implanted  ICD with ischemic cardiomyopathy.   PROCEDURES:  Contrast venogram, insertion of right atrial lead,  insertion of left ventricular lead, and intraoperative high-voltage ICD  test shocks.   Following obtaining informed consent, the patient was brought to the  electrophysiology laboratory and placed on the fluoroscopic table in  supine position.  After routine prep and drape of the left upper chest,  lidocaine was infiltrated in the prepectoral subclavicular region.  I  should note that a contrast venogram had demonstrated the patency in the  course of the extrathoracic left subclavian vein.  Down to the  prepectoral fascia we went, but did not open the pocket.  Two  venipunctures were accomplished with modest difficulty, but without the  aspiration of air, punctured the artery.  Sequentially, a 9.5 initially  with short and then a long 7-French sheath were placed which were passed  a Medtronic MB2 coronary sinus cannulation catheter and a Medtronic  5076, 52-cm active-fixation atrial lead, serial #[id number].   Initially, the coronary sinus was cannulated and a low posterior lateral  branch was cannulated.  We placed a wire in this.  It was really quite  far out there.  We put a 4196 lead, serial #[id number], but  unfortunately, we had high pacing thresholds and so on the contrast  venogram of the coronary sinus, we had seen a high branch and we then  used double Wholey  wire to cannulate the base of the coronary sinus.  There was a valve which was somewhat difficult to pass, so we end up  using an attained two delivery system at least to initially move the  dilator pass and the sheath pass.  Then, we cannulated the high lateral  branch and through the sheath places, the aforementioned attained two  lead also into position on the high lateral wall at the junction of the  mid, really in the middle third between base and apex.  In this  location, the bipolar L wave was 13.9 with a pace impedance of 1540  ohms, threshold of 1.3 volts at 0.5 milliseconds.  Current threshold 0.9  mA.  There was diaphragmatic pacing at 10 volts and the bipolar  configuration, but not in the LV tip pocket configuration.  It thus  appeared that the proximal ring was responsible for diaphragmatic  stimulation.  The attained two delivery system was removed.  The 9.5-  French sheath was removed.  The MB2 was left in place and the right  atrial lead was positioned into the mouth of the right atrial appendage  where the bipolar P-wave was 3.1 with  a pace impedance of 839 ohms,  threshold of 1.4 volts at 0.5 milliseconds.  Current threshold is 2.0  mA.  There was no diaphragmatic pacing at 10 volts.  The current of  injury was brisk.  This lead was then secured to the prepectoral fascia.   At this point, the device pocket was opened.  The previously implanted  device was freed up and removed.  The pocket was extended caudally to  allow for housing of the larger device.  The previously implanted 6947  lead has serial #[id number].  These leads were then attached to a  Concerto II CRT-D ICD, serial #[id number].  Through the device, the  bipolar P-wave was 2 with the pace impedance of 589, a threshold of 1.5  at 0.4, the R-wave was 11.5 with a pace impedance of 437, threshold of 1  volt at 0.3, the LV impedance was 980 with a threshold of 1.5 at 0.2.  Proximal coil impedance was 57, distal  coil impedance was 47.   At this point, high-energy shock was delivered.  One joule shock was  delivered synchronously in sinus rhythm with a measured impedance of 44  ohms.  DFT testing was not repeated at this juncture because the  defibrillation lead had not been moved.   The pocket was copiously irrigated with antibiotic containing saline  solution.  Hemostasis was assured.  The leads and pulse generator were  placed in the pocket, secured to the prepectoral fascia.  I should note  that the pocket had been extended caudally and cephalad for the larger  generator.  We then secured the device to the prepectoral fascia and  closed the wound in two layers.  The wound was washed, dried, and a  benzoin Steri-Strip dressing was applied.  Needle counts, sponge counts,  and instrument counts were correct at the end of procedure according to  staff.   Total fluoroscopy time was 40 minutes and total contrast used was 25 mL.      Duke Salvia, MD, [facility]  Electronically Signed     Duke Salvia, MD, [name]  Electronically Signed    SCK/MEDQ  D:  02/15/2009  T:  02/16/2009  Job:  [id number]   cc:   Vesta Mixer, M.D.

## 2011-01-23 NOTE — H&P (Signed)
NAME:  [name], [name] NO.:  [id number]   MEDICAL RECORD NO.:  [id number]                   PATIENT TYPE:  INP   LOCATION:                                       FACILITY:  [facility]   PHYSICIAN:  Vesta Mixer, M.D.              DATE OF BIRTH:  1941-06-05   DATE OF ADMISSION:  10/31/2002  DATE OF DISCHARGE:                                HISTORY & PHYSICAL   HISTORY OF PRESENT ILLNESS:  The patient is a middle-aged gentleman with  history of a large anteroseptal myocardial infarction several years ago.  He  originally had PTCA and stenting of his left anterior descending artery.  Approximately one week later he had subacute thrombosis of his LAD with  successful re expansion of his stent.  Despite this, he has had problems  with an ischemic cardiomyopathy.  His left ventricle has continued to dilate  and he now has an ejection fraction of around 27%.  He had a recent  Cardiolite study which revealed no clear cut evidence of ischemia, but did  reveal a large anterior scar as well as a moderate to severe left  ventricular dysfunction.  The patient was seen by Dr. Graciela Husbands for  consideration for an ICD placement.  The patient has continued to complain  of some episodes of chest discomfort and so we have decided to proceed with  heart catheterization prior to placing an ICD in him.   The patient continues to have problems with weakness and fatigue and  shortness of breath.  He is still able to do all of his usual daily  activities but is not doing quite as much as he would like.   CURRENT MEDICATIONS:  1. Enteric-coated aspirin once a day.  2. Altace 2.5 mg twice a day.  3. Lipitor 40 mg a day.  4. Nyastin 1 g q.h.s.  5. Coreg 6.25 mg twice a day.  6. Vitamin E twice a day.   ALLERGIES:  He has no known drug allergies.   PAST MEDICAL HISTORY:  1. Coronary artery disease, status post anteroseptal myocardial infarction.  2. Hyperlipidemia.   SOCIAL  HISTORY:  The patient does not smoke and does not drink.  He is a  retired Chart[employer] loss adjuster.   FAMILY HISTORY:  Noncontributory.   REVIEW OF SYSTEMS:  Review of systems is reviewed and essentially negative  except for as noted in the HPI.   PHYSICAL EXAMINATION:  GENERAL:  On exam he is a middle-aged gentleman in no  acute distress.  He is alert and oriented x3 and his mood and affect are  normal.  VITAL SIGNS:  His weight is 231 and his blood pressure is 110/74 with heart  rate of 74.  HEENT:  Reveals 2+ carotids.  NECK:  He has no bruits.  There is no JVD, no thyromegaly.  LUNGS:  Clear to auscultation.  HEART:  Regular rate and rhythm, S1, S2.  He has no murmurs, gallops, or  rubs.  ABDOMEN:  Active bowel sounds.  EXTREMITIES:  He has nontender extremities.  He has no clubbing, cyanosis,  or edema.  NEUROLOGIC:  Nonfocal.   IMPRESSION:  The patient is now admitted with episodes of progressive left  ventricular failure.  We will proceed with heart catheterization for further  evaluation prior to placing an implantable defibrillator in him.  We  discussed the risks, benefits, and options of heart catheterization.  He  understands and agrees to proceed.                                                  Vesta Mixer, M.D.    PJN/MEDQ  D:  10/29/2002  T:  10/29/2002  Job:  [id number]   cc:   Windle Guard, M.D.  [address]  [place], [place] [address]  Fax: [phone]   Duke Salvia, M.D. [facility]

## 2011-01-23 NOTE — Consult Note (Signed)
NAME:  [name], [name] NO.:  [id number]   MEDICAL RECORD NO.:  [id number]                   PATIENT TYPE:  EMS   LOCATION:  ED                                   FACILITY:  [facility]   PHYSICIAN:  Claudette Laws, M.D.               DATE OF BIRTH:  1941/06/23   DATE OF CONSULTATION:  11/02/2002  DATE OF DISCHARGE:                                   CONSULTATION   CHIEF COMPLAINT:  Can not urinate.   HISTORY OF PRESENT ILLNESS:  This 70 year old man who I have been following  for a long time with BPH had a cardiac catheterization two days ago.  Apparently he was catheterized at surgery and then over the last day or so  has developed progressive symptoms of dribbling urine, back pain, some  reaction to the Benadryl. He took one Flomax today. He was to see me in the  office on March 4. 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 When I saw him in the emergency  room, he was somewhat delirious. His blood pressure was 161/91, heart rate  was 80, respirations 20, temperature 101.2.   REVIEW OF SYMPTOMS:  Really unchanged from his recent visit in our office.   ALLERGIES:  The patient states allergy to ANTIHISTAMINE and IVP DYE, X-RAY  DYE.   MEDICATIONS:  He takes Altace, Lipitor, one aspirin a day.   PHYSICAL EXAMINATION:  ABDOMEN:  Soft, benign, bladder not really palpable.  GENITALIA:  Normal circumcised male, normal testicles, no obvious  epididymitis.  RECTAL:  Deferred.   I put a 16 French 8 mL Foley catheter to straight drain. Grossly clear urine  was obtained and was sent for UA and culture.  The catheter was hooked to a  leg bag and we drained out about 400 mL of urine. He was then sent home with  a Foley catheter and at that point I gave him a shot of gentamycin 100 mg IM  and also wrote a prescription for Cipro XR 500 mg one a day #10 and also  Flomax 0.4 mg one a day #30 to take twice a day for  several days. Today is  Thursday and I would like to remove the catheter in about two days and so  the patient was instructed on how to cut the catheter in half and remove it  himself some time Saturday morning anticipating that by that time he should  be able to urinate satisfactorily. He is to call me tonight, tomorrow if he  has any problems. I thought this was a satisfactory way to handle him for  the time being.  Claudette Laws, M.D.    RFS/MEDQ  D:  11/02/2002  T:  11/02/2002  Job:  [id number]   cc:   Vesta Mixer, M.D.  [address], [address]  [place]  [place] [address]  Fax: [phone]

## 2011-01-23 NOTE — H&P (Signed)
NAME:  MAXIMILIEN, HAYASHI NO.:  [id number]   MEDICAL RECORD NO.:  [id number]          PATIENT TYPE:  OIB   LOCATION:                               FACILITY:  [facility]   PHYSICIAN:  Vesta Mixer, M.D. DATE OF BIRTH:  1941-03-06   DATE OF ADMISSION:  06/23/2004  DATE OF DISCHARGE:                                HISTORY & PHYSICAL   HISTORY OF PRESENT ILLNESS:  Mr. Molinelli is a middle-age gentleman with history  of coronary artery disease.  He is status post anterior wall myocardial  infarction.  He has an ischemic cardiomyopathy.  He also has a history of  ventricular tachycardia and has an ICD in place.  He was recently seen by  Dr. Graciela Husbands for ICD monitoring.  He was found to have had lots of episodes of  nonsustained ventricular tachycardia.  At the same time he has been having  lots of angina-like symptoms which he describes as indigestion.  He had been  previously scheduled to have a stress Cardiolite study here in our office.  The Cardiolite was canceled and he was seen in the office for further  evaluation.   Mr. Batten has not felt well over the past week or so.  He has been having  more symptoms of indigestion and generalized weakness.  He has not had any  real episodes of chest pain.  He states that his energy levels have remained  low and he really is not able to do much in the way of physical activity.   CURRENT MEDICATIONS:  1.  Enteric-coated aspirin once a day.  2.  Altace 2.5 mg a day.  3.  Lipitor 40 mg a day.  4.  Niaspan 500 mg a day.  5.  Coreg 12.5 mg twice a day.  6.  Glucosamine once a day.  7.  Coumadin 2.5 mg five days a week with 5 mg two days a week.  8.  Multivitamin once a day.  9.  Flomax 0.4 mg a day.  10. Proscar once a day.  11. Plavix 75 mg a day.  12. Inspra 25 mg a day.   ALLERGIES:  He is intolerant to BENADRYL.  He is also allergic to SUDAFED.   PAST MEDICAL HISTORY:  1.  History of coronary artery disease.  He is status post  PTCA and stenting      of his left anterior descending artery.  He is status post restenting of      his LAD in February 2005.  2.  Congestive heart failure due to an anterior wall myocardial infarction.  3.  History of ICD placement.  4.  Hypertension.  5.  Hyperlipidemia.  6.  Prostatic enlargement.   SOCIAL HISTORY:  The patient does not smoke and does not drink alcohol.   FAMILY HISTORY:  Family history is unremarkable.   REVIEW OF SYSTEMS:  His review of systems was reviewed and is essentially  negative.   EXAMINATION:  GENERAL:  He is a middle-aged gentleman in no acute distress.  He is alert and oriented x3 and  his mood and affect are normal.  VITALS:  His weight is 209, his blood pressure is 108/70 with a heart rate  of 68.  HEENT:  Exam reveals 2+ carotids, he has no bruits, there is no JVD, no  thyromegaly.  LUNGS:  Clear to auscultation.  HEART:  Regular rate, S1, S2, with no murmurs, gallops, or rubs.  ABDOMEN:  Exam reveals good bowel sounds and is nontender.  EXTREMITIES:  There is no clubbing, cyanosis, or edema.  NEUROLOGIC:  Exam is nonfocal.   IMPRESSION AND PLAN:  Mr. Guo presents with what sounds like anginal  equivalent.  He has lots of episodes of indigestion.  He also has lots of  nonsustained ventricular tachycardia on interrogation of his ICD.  We have  scheduled him for a heart catheterization.  Because he is on Coumadin, we  will have to schedule it for Monday, June 23, 2004.  We discussed the  risks, benefits, and options of heart catheterization.  He understands and  agrees to proceed.        ___________________________________________  Vesta Mixer, M.D.    PJN/MEDQ  D:  06/19/2004  T:  06/19/2004  Job:  [id number]   cc:   Windle Guard, M.D.  [address]  [place], [place] [address]  Fax: [phone]

## 2011-01-23 NOTE — Cardiovascular Report (Signed)
NAMECHASKE, PASKETT NO.:  [id number]   MEDICAL RECORD NO.:  [id number]          PATIENT TYPE:  OIB   LOCATION:  2899                         FACILITY:  [facility]   PHYSICIAN:  Vesta Mixer, M.D. DATE OF BIRTH:  05-29-1941   DATE OF PROCEDURE:  06/23/2004  DATE OF DISCHARGE:                              CARDIAC CATHETERIZATION   Mr. Debruyne is a 70 year old gentleman with a history of previous anterior  apical myocardial infarction.  He was recently still having some episodes of  chest pain and chest discomfort as well as some indigestion.  We had  originally scheduled him to have a stress Cardiolite study but when he  showed up he had been having lots of nonsustained ventricular tachycardia as  evidenced by interrogation of his ICD.  We scheduled him for a heart  catheterization following that.   PROCEDURE:  Left heart catheterization with coronary angiography.   SURGEON:  The right femoral artery was easily cannulated using a modified Seldinger  technique.   HEMODYNAMICS:  The LV pressure was 128/17 with an aortic pressure of 132/69.   ANGIOGRAPHY:  Left main:  Left main coronary artery is relatively smooth and normal.   Left anterior descending coronary artery:  The three stents located in the  proximal LAD are all widely patent.  The flow through the LAD is very  slightly slowed and this probably a TIMI grade 2.5.  There are no  significant blockages in the LAD.   The first diagonal vessel is a relatively small vessel.  It originates at  the intersection of all three stents and is probably not acceptable.  It is  a very small vessel.  It is probably 1.5 mm in diameter at its widest point  but it is not suitable for angioplasty.  There is an 80 to 90% stenosis in  the proximal aspect of this first diagonal vessel.   The left circumflex artery is a relatively large system.  There are only  minor luminal irregularities.  The first marginal branch is  normal.   The right coronary artery is a large vessel.  The second marginal vessel is  also fairly large.  The right coronary artery is a large and dominant  vessel.  Posterior descending coronary artery and posterolateral segment  artery are unremarkable.  The left ventriculogram was performed in a 30 RAO  position.  It reveals markedly depressed left ventricular systolic function  with anterior apical akinesis.  The ejection fraction is approximately 20%.   COMPLICATIONS:  None.   CONCLUSION:  1.  No significant coronary artery disease.  2.  Significant left ventricular systolic dysfunction from this previous      anterior apical myocardial infarction.  We will continue with medical      therapy.       PJN/MEDQ  D:  06/23/2004  T:  06/23/2004  Job:  [id number]   cc:   Windle Guard, M.D.  [address]  [place], [place] [address]  Fax: [phone]

## 2011-01-23 NOTE — Discharge Summary (Signed)
NAME:  [name], [name] NO.:  [id number]   MEDICAL RECORD NO.:  [id number]                   PATIENT TYPE:  INP   LOCATION:  4739                                 FACILITY:  [facility]   PHYSICIAN:  Vesta Mixer, M.D.              DATE OF BIRTH:  [birth year]   DATE OF ADMISSION:  11/07/2002  DATE OF DISCHARGE:                                 DISCHARGE SUMMARY   DISCHARGE DIAGNOSES:  1. Congestive heart failure with an ejection fraction of 27%.  2. History of coronary artery disease with a known LAD, Stenting, and a     known occlusion. Recent rest and delayed thallium scan revealed no     significant viable myocardium in the anterior and apical wall.  3. Hyperlipidemia.  4. Prostatic enlargement.   DISCHARGE MEDICATIONS:  Coreg 12.5 mg twice a day, Coumadin 5 mg per day. He  is to start this after he talks with Dr. Graciela Husbands about scheduling his AICD.  Aspirin 81 mg per day. Altace 2.5 mg per day. Lipitor 40 mg each day.  Niaspan 1 gram per day. Flomax 0.4 mg at night.   FOLLOW UP:  The patient is to see Dr. Elease Hashimoto in one week. He is to see Dr.  Graciela Husbands in one week.   HISTORY OF PRESENT ILLNESS:  [name] is a 70 year old gentleman with a  history of coronary artery disease status post PTCA and Stenting. He had  subacute thrombosis of his LAD shortly after his Stent procedure, requiring  a second Stent to be placed. Following this, he then had a fairly large  anterior wall myocardial infarction. He has had problems with chest pain and  congestive heart failure since that time.   HOSPITAL COURSE BY PROBLEM:  1. Congestive heart failure. The patient was admitted with weakness, fatigue     and lethargy. Following his heart catheterization last week, he had     profound weakness, dysuria, and anorexia. He lost 10 pounds because he     could not eat. It was thought that he may have a low output condition. He     was admitted to the hospital and felt somewhat  better. His lab data     revealed a mildly elevated BMP in the 300 range. He did not have any     significant abnormalities and was not thought to have urosepsis or     sepsis. Blood cultures were drawn, the results of which are pending. He     has remained febrile. He will be discharge on the above noted     medications. He will see Dr. Graciela Husbands next week and I will see him back in     the office in one week.  Vesta Mixer, M.D.    PJN/MEDQ  D:  11/08/2002  T:  11/08/2002  Job:  [id number]   cc:   Windle Guard, M.D.  [address]  [place], [place] [address]  Fax: [phone]   Duke Salvia, M.D. [facility]

## 2011-01-23 NOTE — Discharge Summary (Signed)
NAME:  [name], [name]                          ACCOUNT NO.:  [id number]   MEDICAL RECORD NO.:  [id number]                   PATIENT TYPE:  OIB   LOCATION:  6523                                 FACILITY:  [facility]   PHYSICIAN:  Vesta Mixer, M.D.              DATE OF BIRTH:  1941/08/08   DATE OF ADMISSION:  10/19/2003  DATE OF DISCHARGE:  10/20/2003                                 DISCHARGE SUMMARY   DISCHARGE DIAGNOSES:  1. Coronary artery disease - status post PTCA rotational arthrectomy and     stenting of his proximal left anterior descending artery.  2. Dyslipidemia.  3. Congestive heart failure due to his previous anginal wall myocardial     infarction.  4. Prostatic enlargement.   DISCHARGE MEDICATIONS:  1. Plavix 75 daily.  2. Enteric-coated aspirin 81 mg daily.  3. Coumadin - resume as previous dose.  4. Altace 2.5 mg p.o. b.i.d.  5. Lipitor 40 mg daily.  6. Niaspan 500 mg at night.  7. Coreg 12.5 mg p.o. b.i.d.  8. Flomax 0.4 mg daily.  9. Proscar 1 daily.   DISPOSITION:  The patient is to see Dr. Elease Hashimoto in 1 week for an office visit  and a pro time.   HISTORY:  [name] is a 70 year old gentleman with a previous history of  coronary artery disease.  He was referred for a heart catheterization and  intervention of his LAD.  Please see dictated H&P for further details.   HOSPITAL COURSE BY PROBLEMS:  #1 - CORONARY ARTERY DISEASE:  The patient had  a heart catheterization which revealed a severe stenosis involving his left  anterior descending artery.  He underwent successful rotation arthrectomy  using a 1.5 mm bur followed by a 2.0 mm bur.  We then placed two 23 mm drug-  eluting stents (Cypher 3.0 x 23 mm).  These stents were postdilated using a  3.25 mm balloon expanded to 20 atmospheres in the middle overlap section  with 14-16 atmospheres on the ends.  He tolerated the procedure quite well.  Unfortunately, he received some Benadryl and developed some urinary  obstruction due to an enlarged  prostate.  He had a Foley transiently placed.  We gave him some Septra  because of a previous urinary tract infection associated with Foley  placement.  We will send him home also on Septra DS 1 tablet b.i.d. x next 5  days.  The patient will follow up with Dr. Elease Hashimoto as noted above.                                                Vesta Mixer, M.D.    PJN/MEDQ  D:  10/20/2003  T:  10/20/2003  Job:  [id number]   cc:  Windle Guard, M.D.  [address]  [place], [place] [address]  Fax: [phone]   Duke Salvia, M.D.

## 2011-01-23 NOTE — Op Note (Signed)
NAME:  [name], [name] NO.:  [id number]   MEDICAL RECORD NO.:  [id number]                   PATIENT TYPE:  OIB   LOCATION:  2899                                 FACILITY:  [facility]   PHYSICIAN:  Duke Salvia, M.D. [facility]           DATE OF BIRTH:  [birth year]   DATE OF PROCEDURE:  11/20/2002  DATE OF DISCHARGE:                                 OPERATIVE REPORT   PREOPERATIVE DIAGNOSES:  Ischemic heart disease with bronchial arrest; prior  myocardial infarction.  Left ventricular function for ICD  implant is part  of the master protocol.   POSTOPERATIVE DIAGNOSES:  Ischemic heart disease with bronchial arrest;  prior myocardial infarction.  Left ventricular function for ICD  implant is  part of the master protocol.   PROCEDURE:  Implantation of single-chamber defibrillator with intraoperative  defibrillation threshold testing.   DESCRIPTION OF PROCEDURE:  After obtaining informed consent, the patient was  brought to the electrophysiology laboratory and placed on the fluoroscopic  table in supine position.  After routine prep and drape to the left upper  chest, intravenous contrast was injected via the left antecubital vein to  identify the course and the patency of extrathoracic left subclavian vein.  This having been accomplished, lidocaine was infiltrated in the prepectoral  subclavicular region.  An incision was made and carried down to the layer of  the prepectoral fascia using electrocautery.  A pocket was formed; using  electrocautery and sharp dissection, hemostasis was obtained.   Thereafter attention was turned to getting access to the extrathoracic left  subclavian vein, which was accomplished without difficulty and without the  aspiration of air or puncture of the artery.  A guide wire was placed in the  tangent; 0 silk suture was placed in a figure-of-eight fashion and allowed  to hang loosely.   Subsequently a 9-French Taylor introducer  sheath was placed, which was then  passed.  A Medtronic split Quatro secured 6947 65 cm active fixation dual-  coiled defibrillator lead (Ser. No. [id number]).  Under fluoroscopic  guidance it was manipulated into the  right ventricular apex where the  bipolar R-wave was 11.6 mV, with a pacing impedance of 774; a pacing  threshold of 0.5 V at 0.5 msec.  Currented threshold was 0.7 mA and there  was no diaphragmatic pacing at 10 V.   With these acceptable parameters recorded, the leads were then attached to a  Medtronic Maximo VR 7232 CX ICD (Ser. No. [id number]).  To the device the  bipolar R-wave was 12.2 mV, with a pacing impedance of 654 and pacing  threshold of 0.5 V at 0.2 msec.  There was a high-voltage impedance of 40  ohms in the proximal coil and 50 ohms in the distal coil.   With these acceptable parameters recorded, the system was submitted for  defibrillation threshold testing.  The pocket was copiously irrigated with  antibiotic  containing saline solution.  The leads and the pulse generator  were placed in the can.  The pocket was copiously irrigated with antibiotic  containing saline solution.  Hemostasis was obtained.   The lead and the pulse generator having been placed in the defibrillator  pocket, ventricular fibrillation was induced via the T-wave shock.  After a  total duration of 7 sec, a 15 dual shock was delivered through a measured  resistance of 37 ohms; terminated ventricular fibrillation and restoring to  sinus rhythm.   After a wait of 5-6 min, ventricular fibrillation was reintroduced through  the T-wave shock.  After a total duration of 7 sec a 15 joule shock was  delivered through a measured resistance of 37 ohms.  Terminated ventricular  fibrillation and restoring to sinus rhythm.   With these acceptable parameters recorded, the system was implanted.  The  wound was closed in three layers in the normal fashion.  The wound was  washed and dried.   Benzoin Steri-Strips dressing were applied.  Sponge,  needle and instrument counts were correct at the end of the procedure,  according to the staff.   The patient tolerated the procedure well.  The patient's device will be  programmed as part of the master protocol.                                               Duke Salvia, M.D. [facility]    SCK/MEDQ  D:  11/20/2002  T:  11/20/2002  Job:  [id number]   cc:   Vesta Mixer, M.D.  [address]  [place]  [place] [address]  Fax: [phone]   Electrophysiology Lab   [facility]

## 2011-01-23 NOTE — Cardiovascular Report (Signed)
[place]  Patient:    [name], [name]                       MRN: [id number] Proc. Date: 10/28/99 Adm. Date:  [id number] Attending:  Koren Bound CC:         Cardiac Catheterization Laboratory             Wilson O. Jeannetta Nap, M.D.                        Cardiac Catheterization  PROCEDURE:  Left heart catheterization, with a percutaneous transluminal coronary angioplasty and stenting of his left anterior descending coronary artery.  CARDIOLOGIST:  Alvia Grove., M.D.  INDICATIONS:  [name] is a 70 year old gentleman who started having episodes of chest pain earlier today.  He was seen at Dr. Hadassah Pais. [place] office, and was found to have acute electrocardiogram changes, consistent with a myocardial infarction.  He was transferred to the [facility] Emergency Room, and was ultimately transferred to the cardiac catheterization laboratory for further evaluation.  DESCRIPTION OF PROCEDURE:  The right femoral artery was easily cannulated using a 7-French system.  Midway through the procedure we upgraded to an 8-French system to get better guiding support.  HEMODYNAMICS: Left ventricular pressure:  139/20. Aortic pressure:  131/67.  ANGIOGRAPHY: 1. Left main coronary artery:  Is large and normal. 2. Left anterior descending coronary artery:  Has minor luminal irregularities    in the proximal segment between 30%-40%.  Just prior to the first diagonal    vessel there is a 70%-80% stenosis.  This stenosis continues down just past    the diagonal vessel.  The mid-LAD has minor luminal irregularities.  The    first diagonal vessel has a tight 90% stenosis with an acute bend at the    takeoff.  This angle was approximately 120 degrees from the LAD.  The    distal LAD has a very sluggish flow, consistent with an embolus. 3. Left circumflex coronary artery:  Is a large vessel.  There are minor    luminal  irregularities, but no critical stenosis. 4. Right coronary artery:  Is a large and dominant vessel.  There are minor    luminal irregularities, but the PDA and posterolateral segment artery are    essentially normal.  LEFT VENTRICULOGRAM:  Was performed in the 30-degree RAO position.  It reveals mildly depressed left ventricular systolic function.  There is akinesis/dyskinesis of the anteroapical wall and inferior apical wall.  The ejection fraction is approximately 40%.  The anterior base and inferior base contract normally. There is no mitral regurgitation.  There is no evidence of thrombus.  PERCUTANEOUS TRANSLUMINAL CORONARY ANGIOPLASTY:  The patient had been given 5000 units of heparin down in the emergency room.  His ACT was 190.  We gave him an additional 1500 units of heparin with a subsequent ACT of 143.  Later on in the  case he was found to have some thrombus in the catheter, and an ACT was found to be 173.  He received an additional 5000 units of heparin at this time.  The patient also received Integrilin as a double bolus drip.  The left anterior descending coronary artery was wired using a traverse 0.014 angioplasty wire.  A 3.0 mm x 18.0 mm Tetra stent was positioned carefully across the LAD stenosis.  It was deployed at 9  atmospheres for a total of 50 seconds.  This resulted in a marked improvement of the vessel lumen.  It also changed the  geometry of the diagonal vessel, and made it appear to be much more approachable. We made multiple attempts at wiring the diagonal vessel, and finally were able o get an angioplasty wire down this diagonal vessel.  We then tried multiple times to cross the balloon into the diagonal vessel, but were unsuccessful.  It was thought that the wire had gone between the strut edge and the edge of the vessel, and the balloon would not cross between these two close structures.  At this point we decided to flare the proximal  aspect of the stent, since the proximal aspect seemed to be somewhat under-deployed.  We had some difficulty in getting a balloon back across the stent edge, and it was thought that this was ue to wire bias.  We finally were able to cross the proximal aspect of the stent with a 3.5 mm x 15.0 mm Quantum Ranger.  We inflated the balloon pulse to 12 atmospheres for 44 seconds in the mid-aspect of the stent.  The balloon was then pulled back and was inflated up to 18 atmospheres for 60 seconds in the proximal end.  This  resulted in a nice angiographic lumen with no evidence of edge dissection, and  well-apposed stent.  We made one final attempt to cross into the diagonal vessel, but we were unsuccessful.  The patient was at this point taken to the MICU in satisfactory condition.  CONCLUSIONS: 1. Evidence of an acute anterior wall myocardial infarction.  He presented    with a plaque rupture in the proximal left anterior descending coronary artery    with some extension into the first diagonal vessel.  At the time that he    presented to the cardiac catheterization laboratory, the vessel did have    TIMI grade 1-2 flow.  There was evidence of distal embolization, thought    due to this proximal clot, which propagated distally. 2. Successful percutaneous transluminal coronary angioplasty and stenting of    the proximal left anterior descending coronary artery.  We were not able    to dilate the diagonal vessel. 3. Mildly depressed left ventricular systolic function with evidence of an    anterior apical myocardial infarction.  DISPOSITION:  We will continue him on Integrilin for 18 hours, in an attempt to  dissolve the clot.  By the end of the procedure there was better flow down the distal aspect of the left anterior descending coronary artery, and it was thought that he will have fairly well-preserved flow while being anticoagulated. DD:  10/28/99 TD:  10/29/99 Job:  [id number]

## 2011-01-23 NOTE — H&P (Signed)
NAME:  OLA, FAWVER NO.:  [id number]   MEDICAL RECORD NO.:  [id number]                   PATIENT TYPE:  INP   LOCATION:  4739                                 FACILITY:  [facility]   PHYSICIAN:  Vesta Mixer, M.D.              DATE OF BIRTH:  December 30, 1940   DATE OF ADMISSION:  11/07/2002  DATE OF DISCHARGE:  11/08/2002                                HISTORY & PHYSICAL   HISTORY:  The patient is a middle-aged gentleman with a history of a large  anterior wall myocardial infarction.  He had PTCA and stenting of his left  anterior descending artery, but approximately one week later he had a  subacute thrombosis of his LAD with successful re-expansion of his stent by  Dr. __________ .  Despite this he has had problems with congestive heart  failure and an ischemic cardiomyopathy.  His left ventricle has continued to  dilate now with an ejection fraction of 27%.  He had a Cardiolite study  recently which revealed evidence of ischemia.  He was admitted to the  hospital for heart catheterization on 10/31/02.  Heart catheterization at  that time revealed a severely diseased stent with a severe disease in the  proximal LAD.  It revealed a large area of akinesis out in the anterior  apical segments.   He presented yesterday for a rest and delayed thalium scan.  He was found to  have an extensive scarring on the anterior and apical walls with no real  evidence of a viable myocardium.  He presents today with severe fatigue,  weakness and lethargy.  He has had lots of problems since his heart  catheterization last week.  He has not been able to use the bathroom and he  has had fevers and lethargy.  He has had several reactions to the  medications including bladder outlet obstruction due to the Benadryl.  He  developed a urinary tract infection and was given an antibiotic.  He  apparently had an allergic reaction to the antibiotic.  He is now admitted  to the  hospital for weakness, failure, and to rule out sepsis.   CURRENT MEDICATIONS:  1. Aspirin daily.  2. Altace 2.5 mg twice a day.  3. Lipitor 40 mg daily.  4. Niaspan 1 gram q.h.s.  5. Coreg 6.25 mg twice a day.  6. Vitamin E 2 tablets a day.   ALLERGIES:  He has no known drug allergies, but he is intolerant to Benadryl  and may have had an allergic reaction to Cipro.   PAST MEDICAL HISTORY:  1. Coronary artery disease with an ischemic cardiomyopathy.  2. Hyperlipidemia.  3. Prostatic enlargement.   SOCIAL HISTORY:  The patient does not smoke.  He is a retired Nurse, mental health.  He does not drink alcohol.   FAMILY HISTORY:  Noncontributory.   PHYSICAL EXAMINATION:  GENERAL:  He is  a middle-aged gentleman in no acute  distress.  He looks to be quite pale and ashen.  VITAL SIGNS:  Blood pressure 122/70 with a heart of 56.  HEENT/NECK:  Reveals 2+ carotids, he has no bruits.  There is no JVD or  thyromegaly.  LUNGS:  Clear to auscultation.  HEART:  Regular rate, S1, S2.  He has no S3 gallop.  ABDOMEN:  Reveals good bowel sounds and is nontender.  EXTREMITIES:  He has no cyanosis, clubbing or edema.  NEUROLOGIC:  Nonfocal.  His gait is fairly slow.   SUMMARY:  The patient appears to be just systemically ill.  It is not clear  whether he is suffering from congestive heart failure and a very poor  forward output of his heart.  He also may have some degree of a urinary  tract infection or a urosepsis since he developed a bladder outlet  obstruction with Benadryl.   PLAN:  We will admit him to the hospital for 23 hour observation.  We will  collect cardiac enzymes.  We will get blood cultures and a urine sample.  We  will gently hydrate him and see if he feels better.  He has not been able to  eat regularly at all.  We will also need to evaluate him for the possibility  of gastrointestinal problems.                                               Vesta Mixer, M.D.     PJN/MEDQ  D:  11/18/2002  T:  11/19/2002  Job:  [id number]   cc:   Windle Guard, M.D.  [address]  [place], [place] [address]  Fax: [phone]   Duke Salvia, M.D. [facility]

## 2011-01-23 NOTE — Discharge Summary (Signed)
[place]. [facility]  Patient:    [name], [name]                       MRN: [id number] Adm. Date:  [id number] Disc. Date: [id number] Attending:  Koren Bound CC:         Buren Kos, M.D.             Genene Churn. Sherin Quarry, M.D.                           Discharge Summary  DISCHARGE DIAGNOSES: 1. Subacute thrombosis of a recently placed left anterior descending coronary    artery stent with successful reopening with percutaneous transluminal coronary    angioplasty and a new stent placement. 2. Hypercholesterolemia. 3. Anxiety.  DISCHARGE MEDICATIONS: 1. Enteric-coated aspirin 325 mg q.d. 2. Plavix 75 mg q.d. for 28 days. 3. Imdur 30 mg q.d. 4. Nitroglycerin 0.4 mg sublingual p.r.n. 5. Altace 2.5 mg b.i.d. 6. Zocor 40 mg q.d. 7. Toprol XL 50 mg q.d.  DISCHARGE INSTRUCTIONS:  The patient is to eat a low fat, low cholesterol diet. He is to watch for any signs of bleeding.  He has been instructed to walk every day. He will see Dr. Vesta Mixer, Montez Hageman. next week.  HISTORY OF PRESENT ILLNESS:  [name] is a 70 year old gentleman who is status ost a recent anterior wall myocardial infarction.  He was discharged last week but presented three days later with a subacute thrombosis.  Please see dictated history and physical for further details.  HOSPITAL COURSE:  #1 - CORONARY ARTERY DISEASE:  The patient presented and was taken directly to he catheterization lab.  He was found to have a completely occluded LAD stent. The vessel was successfully angioplastied and stented by Dr. Francisca December.  The patient had a relatively slow rough recovery.  It was complicated by lots of abdominal pain, nausea, vomiting, and belching.  He was also swallowing lots of  air.  The patient gradually regained his strength and his confidence.  He had an echocardiogram which revealed only a mildly reduced left ventricular systolic function with mild to  moderate hypokinesis of the anterior and apical walls. He does have some contractility remaining in his anterior and apical walls.  There was no evidence of thrombus.  The patient was maintained on Lovenox for five days following the Integrilin bolus.  He will be discharged on the above-noted medications and disposition.  #2 - ABDOMINAL PAIN:  The patient has had some mild low grade fevers over the past several days.  Dr. Genene Churn. Sherin Quarry saw him in consultation.  The patient was ot found to have any abnormalities.  His amylase and lipase was normal.  He will follow up with Dr. Genene Churn. Sherin Quarry as needed.  On the day of discharge, his ALT and ST were mildly elevated but without any significant abnormalities.  This will be followed by Dr. Buren Kos and Dr. Genene Churn. Sherin Quarry. DD:  11/07/99 TD:  11/08/99 Job: [id number]

## 2011-01-23 NOTE — Op Note (Signed)
[place]. [facility]  Patient:    [name], [name] Visit Number: [id number] MRN: [id number]          Service Type: DSU Location: [facility] Attending Physician:  Ronne Binning Dictated by:   Nicki Reaper, M.D. Proc. Date: 05/24/01 Admit Date:  05/24/2001   CC:         Nicki Reaper, M.D. (2 copies)   Operative Report  PREOPERATIVE DIAGNOSIS:  Saw injury, left thumb.  POSTOPERATIVE DIAGNOSIS:  Saw injury, left thumb.  OPERATION:  Debridement open fracture, repair FPL, repair ulnar digital artery nerve.  SURGEON:  Nicki Reaper, M.D.  ASSISTANT:  None.  ANESTHESIA:  Axillary block.  ANESTHESIOLOGIST:  Halford Decamp, M.D.  HISTORY OF PRESENT ILLNESS:  This is a 70 year old male who suffered a saw injury to his left thumb.  Seen at the request of the emergency room.  PROCEDURE:  The patient was brought to the operating room where an axillary block was carried out without difficulty.  He was prepped and draped using Betadine scrubbing solution with the left arm free.  The limb was exsanguinated with an Esmarch bandage and the tourniquet placed to the arm was inflated 250 mmHg.  The wound was opened, irrigated and debrided.  The fracture was debrided.  The FPL was repaired with modified Kessler using 3-0 Ethibond suture.  The operative microscope was brought into position.  The central artery was identified.  This was clipped back to normal intima proximally and distally and a repair performed at the back wall first technique with interrupted 9-0 Nylon suture.  The ulnar digital nerve was identified, this was repaired, aligning vesicles with interrupted 9-0 Nylon suture.  Radial nerve and artery were cut so far distally that it was unable to find either proximal or distal segments of any size.  The skin was closed with interrupted 5-0 Nylon sutures.  Compression dressing and splint was applied.  The patient tolerated the procedure well and was  taken to the recovery room for observation in satisfactory condition.  He is discharged home to return to the [facility] in one week on Vicodin and Keflex. Dictated by:   Nicki Reaper, M.D. Attending Physician:  Ronne Binning DD:  05/24/01 TD:  05/24/01 Job: [id number]

## 2011-01-23 NOTE — H&P (Signed)
NAME:  [name], [name] NO.:  [id number]   MEDICAL RECORD NO.:  [id number]                   PATIENT TYPE:  INP   LOCATION:  0354                                 FACILITY:  [facility]   PHYSICIAN:  Bertram Millard. Dahlstedt, M.D.          DATE OF BIRTH:  [birth year]   DATE OF ADMISSION:  07/02/2003  DATE OF DISCHARGE:                                HISTORY & PHYSICAL   REASON FOR ADMISSION:  Fever, possible retained catheter.   BRIEF HISTORY:  A 70 year old male who was seen by Claudette Laws, M.D. in  the office approximately six days ago.  The patient was in urinary retention  and had a catheter placed.  He was sent home on Flomax and Proscar.  It  sounds like he was having significant bladder spasms over the weekend.  Without calling the office he removed the catheter himself.  He apparently  did this by cutting the catheter off outside the meatus.  He is not sure if  there is retained catheter or not.  He presented to the emergency room with  prior history.   Additionally, the patient has been experiencing fever between 101-102 over  the past two to three days.  He has been feeling worse.  He has had some  abdominal pain, mainly centered on the right side which is getting worse as  well.  He has had no nausea or vomiting.  He apparently had a normal CT scan  done at [facility] prior to his presentation to Claudette Laws, M.D.  He  has not been on antibiotics.   PAST MEDICAL HISTORY:  1. Hypertension.  2. Coronary artery disease.  3. He has had two MIs.  4. He has had PTCA x2.  5. He has an arrhythmia and is treated with an AICD.  6. He is status post two separate operations on his shoulders.  7. Knee arthroscopy.  8. Appendectomy.  9. Ear operation as a child.   MEDICATIONS:  1. Corgard 12.5 mg b.i.d.  2. Altace 2.5 mg b.i.d.  3. Coumadin 2.5 mg six days a week and 5 mg one day a week.  4. Lipitor 40 mg daily.   ALLERGIES:  He is not  allergic to any medications.   SOCIAL HISTORY:  He is married and has two children.  He is a retired  Runner, broadcasting/film/video.  He denies tobacco or alcohol use.   FAMILY HISTORY:  Noncontributory.   REVIEW OF SYSTEMS:  Noncontributory.   PHYSICAL EXAMINATION:  GENERAL:  Somewhat listless middle aged male.  He was  alert and oriented.  VITAL SIGNS:  Temperature 103.6, blood pressure 104/61, heart rate 88,  respiratory rate 24.  HEENT:  Normal.  LUNGS:  Clear.  HEART:  Regular rate.  He had no CVA tenderness.  There were no flank  masses.  ABDOMEN:  Mildly tender throughout, but mainly in the right and left lower  quadrant.  No rebound or guarding was noted.  There was a well healed right  lower quadrant scar present.  GENITOURINARY:  His phallus was circumcised.  There was a little bit of  urinary leakage, but no blood present.  Penile and scrotal skin was normal.  Testicles were noted bilaterally.  There were no inguinal hernias.  He had  normal anal sphincter tone.  Glan was 4+ with mild tenderness and bogginess.  No rectal masses were present.  EXTREMITIES:  Normal.   LABORATORIES:  In the emergency room urinalysis revealed large LE positive,  trace ketones, moderate bilirubin, large blood, 7-10 white cells, 21-50 red  cells, rare bacteria.  This was cultured.   White count 11,800, hemoglobin 13.8 g/dl, hematocrit 04.5%, platelet count  136,000.   BMET was significant for sodium 131, potassium 3.5, chloride 103, carbon  dioxide 25, glucose 121, BUN 35, creatinine 1.5, calcium 8.3.  Albumin 2.9,  total protein 6.1, SGOT and SGPT normal, alkaline phosphatase 65, bilirubin  1.5.   IMPRESSION:  1. Possible prostatitis.  2. Fever possibly due to prostatitis.  3. Urinary retention.  4. Possible catheter trauma.  5. Hypertension.  6. Coronary artery disease.   PLAN:  1. Flexible sigmoidoscopy was performed at the patient's bed side under     sterile conditions.  Urethra was normal.  His  prostatic urethra was quite     long and obstructive.  Bladder was entered.  There was a significant     amount of sediment in the bladder.  I did not see a catheter curled up in     the bladder.  However, since there was some blood and a fair amount of     sediment, this will need to be repeated.  2. Foley catheter placed.  3. Will admit patient for observation and have medicine see the patient.  4. Will start patient on Cipro.  5. Will have Claudette Laws, M.D. follow up.                                               Bertram Millard. Dahlstedt, M.D.    SMD/MEDQ  D:  07/02/2003  T:  07/02/2003  Job:  [id number]

## 2011-01-23 NOTE — Discharge Summary (Signed)
[place]. [facility]  Patient:    [name], [name]                       MRN: [id number] Adm. Date:  [id number] Disc. Date: [id number] Attending:  Koren Bound CC:         Hadassah Pais. Jeannetta Nap, M.D.                           Discharge Summary  ADMISSION DIAGNOSIS:  Acute anterior wall myocardial infarction.  DISCHARGE DIAGNOSES: 1. Acute anterior wall myocardial infarction. 2. Status post percutaneous transluminal coronary angioplasty and stenting of the    proximal left anterior descending. 3. Hypercholesterolemia.  DISCHARGE MEDICATIONS: 1. Enteric-coated aspirin 325 mg a day. 2. Plavix 75 mg a day for 28 days. 3. Nitroglycerin 0.4 mg sublingually as needed. 4. Toprol XL 50 mg a day. 5. Altace 2.5 mg a day. 6. Zocor 4 mg a day.  DISPOSITION:  The patient has been instructed to eat a low fat/low cholesterol diet.  He is to watch for any signs of bleeding.  He is to see Dr. Elease Hashimoto in one to two weeks.  HISTORY:  Mr. Yoho is a 70 year old gentleman who was admitted through the emergency room with an acute myocardial infarction.  Please see dictated H&P for further details.  HOSPITAL COURSE:  Coronary artery disease.  The patient was taken to the catheterization lab where he was found to have an ulcerated plaque in his left anterior descending artery.  The plaque was quite hazy and was approximately 80% stenosed.  There was evidence of a previous thrombosis with some embolization of the thrombus down to the distal LAD.  He underwent successful PTCA and stenting of the left anterior descending artery.  We were able to dislodge most of the distal clot with an angioplasty wire and he was given Integrilin with resolution of fairly good flow to the apical LAD.  The patient tolerated the procedure quite well. e did well over the next several days and tolerated his medications.  On the night prior to discharge, he did have an eight-beat run of  nonsustained ventricular tachycardia.  He was asymptomatic at the time.  He will be continued on the beta blocker.  The patient will be discharged on the above-noted medications and disposition.  He has a history of hypercholesterolemia.  We have drawn a fasting lipid profile this morning but the results are pending and the computer system s down.  We will continue him on Zocor 40 mg until we get his cholesterol numbers.  I will see him again in one to two weeks. DD:  11/01/99 TD:  11/01/99 Job: [id number]

## 2011-01-23 NOTE — Cardiovascular Report (Signed)
NAME:  [name], SAYED                          ACCOUNT NO.:  [id number]   MEDICAL RECORD NO.:  [id number]                   PATIENT TYPE:  OIB   LOCATION:  6523                                 FACILITY:  [facility]   PHYSICIAN:  Vesta Mixer, M.D.              DATE OF BIRTH:  July 05, 1941   DATE OF PROCEDURE:  10/19/2003  DATE OF DISCHARGE:                              CARDIAC CATHETERIZATION   PROCEDURE PERFORMED:  1. Left heart catheterization.  2. Coronary angiography.  3. Rotational atherectomy.  4. Percutaneous transluminal coronary angioplasty and stenting.   CARDIOLOGIST:  Vesta Mixer, M.D.   INDICATIONS FOR PROCEDURE:  Mr. Ramdass is a 70 year old gentleman with a  history of an anterior wall myocardial infarction.  He is status post PTCA  and stenting several years ago.  He, unfortunately, had a subacute  thrombosis of the stent and had a repeat stent procedure.  He has developed  congestive heart failure due to an ischemic cardiomyopathy over the past  several years.  He also developed some ventricular tachycardia and had an  AICD placed.   Recently the patient has been having progressive shortness of breath and  some chest discomfort.  Previous heart catheterization had revealed severe  in-stent restenosis of both the LAD stents.  An immediate and 24-hour  delayed thallium scan revealed no evidence of viable myocardium, so he was  not referred to bypass grafting.   We have discussed repeat PTCA and stenting, but he has refused until just  recently.  He now agrees to come in for repeat intervention.   DESCRIPTION OF PROCEDURE:  The right femoral artery was easily cannulated  using the modified Seldinger technique.   HEMODYNAMIC DATA:  LV pressure was 260/17 with an aortic pressure of 160/82.   ANGIOGRAPHIC DATA:  The left main was fairly normal.   The left anterior descending artery had a proximal 90-95% stenosis.  This  stenosis was quite long and extended  through both the stents down into the  LAD.  The first diagonal vessel arose in the middle of this plaque and was  severely diseased.  It was fairly small.  The remainder of the LAD has only  minor luminal irregularities.  There is some disease that extends out  approximately 8 mm past the distalmost stent.  There is TIMI Grade I flow  down the LAD at this time.   The left circumflex artery is a fairly large branch.  It gives off two  obtuse marginal arteries, which are fairly normal.   The right coronary artery is large and dominant.  It gives off a normal  posterior descending artery and normal posterolateral branches.   VENTRICULOGRAPHIC DATA:  A left ventriculogram was performed in a 30 RAO  position.  It reveals severely depressed left ventricular systolic function.  The ejection fraction is approximately 20%.  There is anteroapical  akinesis/dyskinesis.   PERCUTANEOUS TRANSLUMINAL  CORONARY ANGIOPLASTY PROCEDURE:  The 6 French  sheath was traded out for an 8 [place] sheath.  The left main was engaged  using an 8 [place] Judkins left 4 guide.  The left anterior descending artery  was wired using a Rota floppy guidewire.  A 1.5 mm bur was placed down into  the LAD.  A total of six runs were performed st 269,000 rpm for a total of  10 seconds each.  This resulted in some improvement of the vessel lumen.  This bur was then traded out for 2.5 mm bur.  This was used on seven  individual runs for 10 seconds each at 165,000 rpm.  At this point the  Rotablator equipment was removed.  A 3.0 x 23 mm CYPHER stent was positioned  distal to the distalmost stent and including all of the visible  atherosclerosis.  It was deployed at 18 atmospheres for 42 seconds.  A  second 3.0 x 23 mm CYPHER stent was positioned with some overlap with the  first stent.  It was deployed at 16 atmospheres for 20 seconds.  These two  stents covered the expanse of the in-stent restenosis.   At this point a 3.25 mm x  15 mm Quantum Monorail was positioned within the  stents.  It was positioned in the distal aspect of the stent and was  inflated to 14 atmospheres.  It was then pulled back slightly and it was  inflated to 16 atmospheres.  We then pulled back to the overlap segment and  it was inflated up to 20 atmospheres for 13 seconds.  It was pulled back a  little more and inflated again up to 20 atmospheres.  It was then placed in  the proximal aspect and the stent was inflated up to 16 atmospheres for 19  seconds.  This resulted in a very nice angiographic result with 0% residual  stenosis.  Unfortunately, the diagonal, which was originally severely  diseased was occluded during this procedure.  He had no chest pain during  the procedure and the closure of the diagonal was essentially not a  significant event.   COMPLICATIONS:  None.   CONCLUSION:  Successful rotational atherectomy with stent placement to the  proximal left anterior descending.   The patient is in stable condition and will be sent to the [facility].                                               Vesta Mixer, M.D.    PJN/MEDQ  D:  10/19/2003  T:  10/20/2003  Job:  [id number]   cc:   Windle Guard, M.D.  [address]  [place], [place] [address]  Fax: [phone]   Duke Salvia, M.D.

## 2011-01-23 NOTE — Discharge Summary (Signed)
NAME:  [name], [name] NO.:  [id number]   MEDICAL RECORD NO.:  [id number]                   PATIENT TYPE:  INP   LOCATION:  0354                                 FACILITY:  [facility]   PHYSICIAN:  Claudette Laws, M.D.               DATE OF BIRTH:  1940/11/25   DATE OF ADMISSION:  07/02/2003  DATE OF DISCHARGE:  07/04/2003                                 DISCHARGE SUMMARY   HISTORY:  This is a 70 year old gentleman who developed acute urinary  retention about 10 days ago.  We put a Foley catheter in in the office and  started him on Flomax and Proscar.  However, over the weekend he developed  some apparent bladder spasm, some chills.  He then in the middle of the  night cut the catheter out and then presented to the emergency room where he  was seen by my partner, Bertram Millard. Dahlstedt, M.D.  Cystoscopy was performed  in the ER.  There was no apparent retained catheter.  However, the Foley was  replaced.  He was noted to have a white count of 11,000.  He was admitted  and started on IV antibiotics and a urine culture was obtained.   This patient has defibrillator in place, is on Coumadin for apparent cardiac  arrhythmia and low ejection fraction of between 20-30%.  He sees Vesta Mixer, M.D. for cardiology care.   LABORATORIES:  The urine culture was still pending.  It was reincubated.  The blood culture showed no growth at 24 hours and they are still pending.  His repeat white count went down to 6100.  His hemoglobin was 13.8,  hematocrit 40.4.  Electrolytes showed a sodium of 131, BUN 35, creatinine  1.5, calcium 8.3.  His chest x-ray showed mild cardiomegaly, no evidence of  active disease.  EKG showed normal sinus rhythm, right bundle branch block.   HOSPITAL COURSE:  The patient came in through the emergency room early in  the morning on July 02, 2003.  He was started on IV Cipro.  He had one or  two episodes of chills, but gradually  defervesced.  Temperature came down.  He was feeling better on the day of discharge.  The Foley catheter was  draining well.  No significant bladder spasms.  He thought he could go on  home.  The plan is to bring him back to the office next week for catheter  removal, follow-up cystoscopy, and a voiding trial.  He will stay on his  Flomax and Proscar in the interim.  He understands that if he comes to  surgery because of chronic retention we will have to stop his Coumadin  preoperatively.   FINAL DIAGNOSES:  1. Acute bacterial prostatitis (cultures pending).  2. History of acute urinary retention with an indwelling Foley catheter.  3. Hypertension.  4. Coronary artery disease/cardiac arrhythmia/defibrillator in place.  OPERATION:  None.   CONDITION ON DISCHARGE:  Stable.   DISCHARGE MEDICATIONS:  1. Corgard 12.5 mg b.i.d.  2. Altace 2.5 mg b.i.d.  3. Coumadin 2.5 mg six days a week, 5 mg one a day.  4. Lipitor 40 mg daily.  5. Flomax 0.4 mg daily.  6. Proscar 5 mg daily.  7. Cipro 250 mg one b.i.d. #14.   DISPOSITION:  Regular diet.  Force fluids.  Limited activity.  Foley  catheter to a leg bag.  To see me in the office in five days for a follow-  up.                                               Claudette Laws, M.D.    RFS/MEDQ  D:  07/04/2003  T:  07/04/2003  Job:  [id number]   cc:   Vesta Mixer, M.D.  [address]  [place]  [place] [address]  Fax: [phone]

## 2011-01-23 NOTE — Cardiovascular Report (Signed)
NAME:  [name], [name] NO.:  [id number]   MEDICAL RECORD NO.:  [id number]                   PATIENT TYPE:  OIB   LOCATION:  2899                                 FACILITY:  [facility]   PHYSICIAN:  Vesta Mixer, M.D.              DATE OF BIRTH:  [birth year]   DATE OF PROCEDURE:  10/31/2002  DATE OF DISCHARGE:                              CARDIAC CATHETERIZATION   INDICATIONS FOR PROCEDURE:  The patient is a 70 year old gentleman with a  history of an anterior wall myocardial infarction.  He is status post  placement of two stents which overlap each other in his proximal LAD for  restenosis of the initial stent procedure.  He now returns with progressive  heart failure and progressive left ventricular enlargement.  On the stress  Cardiolite study, he was found to have evidence of a large anterior scar,  but no significant redistribution by Cardiolite scanning.  He is referred  for heart catheterization for further evaluation prior to consideration for  ICD placement.   PROCEDURE:  Left heart catheterization with coronary angiography.   DESCRIPTION OF PROCEDURE:  The right femoral artery was easily cannulated  using the modified Seldinger technique.   HEMODYNAMICS:  The left ventricular pressure is 152/36 with an aortic  pressure of 154/81.   ANGIOGRAPHY:  1. Left main:  The left main coronary artery is smooth and normal.   1. Left anterior descending:  The left anterior descending artery has a     proximal narrowing of 90-95% just outside the edge of the proximal most     stent edge.  The remainder of the stent has diffuse 75-80% in-stent     restenosis.  Following this, the LAD has only minor luminal     irregularities.   There is a small to moderate-sized first diagonal branch which originates in  the middle of the stent.  This is stenosed approximately 60-70% in the  proximal segment.  There is TIMI 2 grade flow down the LAD.   1. Left  circumflex:  The left circumflex artery is a large vessel.  It is     smooth and normal throughout its course.   1. Right coronary artery:  The right coronary artery is large and dominant.     There are minor luminal irregularities.  The posterior descending artery     and the posterolateral segment artery are unremarkable.   LEFT VENTRICULOGRAM:  The left ventriculogram is performed in the 30 RAO  position.  It reveals moderate-to-severe left ventricular enlargement.  There is a large area of anterior apical and inferoapical akinesis.  The  ejection fraction is approximately 25%.   COMPLICATIONS:  None.   CONCLUSIONS:  1. Single vessel disease involving the left anterior descending artery.  2. He has significant re-stenosis within the two overlapping stents.  I do     not think that further angioplasty  and stenting of this segment will     result in any significant improvement.  He has a large akinetic area.  It     is possible that this area is hibernating from chronic ischemia.  He did     not have any evidence of viable myocardium with a     Cardiolite study.  However, a 24-hour delayed thallium scan may give [place]     better viability data.  We will need to consider coronary artery bypass     grafting for revascularization.  I do not think that adding a third stent     to this same site would be of much benefit.                                               Vesta Mixer, M.D.    PJN/MEDQ  D:  10/31/2002  T:  11/01/2002  Job:  [id number]   cc:   Windle Guard, M.D.  [address]  [place], [place] [address]  Fax: [phone]

## 2011-01-23 NOTE — Assessment & Plan Note (Signed)
[facility]                         ELECTROPHYSIOLOGY OFFICE NOTE   NAME:Gilson, TALIS IWAN                       MRN:          [id number]  DATE:11/22/2006                            DOB:          30-Apr-1941    Mr. Gesner was seen today in the clinic for followup of his Medtronic,  model [id number] Maximo, date of implant was November 20, 2002, for ventricular  tachycardia.  On interrogation of his device today, his battery voltage  is 2.97 with a charge time of 7.85 seconds.  R waves measured 11.5 mV  with a ventricular pacing threshold of 1 volt at 0.2 msec and a  ventricular lead impedance of 464 ohms.  Shock impedance was 51.  There  were 25 nonsustained episodes since last interrogation.  No changes were  made in his parameters.  He will continue with his [facility]  transmissions on an every 3 month basis with a return office visit in  one year's time.      Altha Harm, LPN  Electronically Signed      Duke Salvia, MD, [facility]  Electronically Signed   PO/MedQ  DD: 11/22/2006  DT: 11/22/2006  Job #: [id number]

## 2011-01-23 NOTE — Discharge Summary (Signed)
   NAME:  [name], NORDIN NO.:  [id number]   MEDICAL RECORD NO.:  [id number]                   PATIENT TYPE:  OIB   LOCATION:  4738                                 FACILITY:  [facility]   PHYSICIAN:  Duke Salvia, M.D. 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  This has left him  with a residual EF of 25%.  The patient was seen by Dr. Graciela Husbands and underwent  a T wave alternans test that was reported as positive.  He presented for  placement of a ICD after meeting the requirement for criteria.   HOSPITAL COURSE:  The patient was admitted and underwent placement of a  Medtronic ICD.  He tolerated the procedure well.  He had no immediate postop  complications and was discharged the following day in stable condition.   DISCHARGE MEDICATIONS:  1. Altace 2.5 b.i.d.  2. Lipitor 40 q.h.s.  3. Niacin 1000 mg nightly.  4. Flomax 0.4 mg nightly.  5. Coreg 12.5 b.i.d.  6. Coated aspirin 81 a day.  7. Bactrim DS b.i.d.  8. Tylenol one to two tablets every four to six hours as needed for pain.   ACTIVITY:  As per pacemaker discharge sheet.   WOUND CARE:  As per pacemaker discharge sheet.   DIET:  Low fat, low salt, low cholesterol diet.    FOLLOW UP:  An appointment was scheduled at the pacemaker clinic at [facility]  on April 1, at 9:45 a.m.  He would follow to see Dr. Graciela Husbands within three  months and the office will call to schedule that appointment.  The patient  was to keep his previously scheduled appointments with Dr. Elease Hashimoto.     Chinita Pester, C.R.N.P.  [facility]                 Duke Salvia, M.D. [facility]    DS/MEDQ  D:  11/21/2002  T:  11/22/2002  Job:  [id number]   cc:   Vesta Mixer, M.D.  [address]  [place]  [place] [address]  Fax: [phone]   Pacemaker Clinic at [facility]

## 2011-01-23 NOTE — H&P (Signed)
NAME:  [name], [name] NO.:  [id number]   MEDICAL RECORD NO.:  [id number]                   PATIENT TYPE:  INP   LOCATION:  0354                                 FACILITY[facility]   PHYSICIAN:  Vesta Mixer, M.D.              DATE OF BIRTH:  05/04/1941   DATE OF ADMISSION:  07/02/2003  DATE OF DISCHARGE:  07/04/2003                                HISTORY & PHYSICAL   HISTORY OF PRESENT ILLNESS:  Mr. Chalk is a middle-aged gentleman with a  history of coronary artery disease.  He is status post remote anterior wall  myocardial infarction.  He has had several stents placed in his proximal  LAD.  His last heart catheterization was approximately one year ago.  He was  found to have fairly severe restenosis of his LAD stent.  We considered him  for revascularization.  A 24-hour delayed stress thallium scan revealed no  significant amount of reversible myocardium, and in fact little to no viable  myocardium at all out in the anterior wall.  He was not referred for  coronary artery bypass grafting because of the lack of viable myocardium in  the anterior wall.  Over the past six months, he has felt weaker and has  developed more and more shortness of breath and some chest discomfort with  exertion.  We have offered to do a catheter-based intervention to help with  revascularization since it would provide revascularization at a much lower  risk than bypass surgery.  He has not wanted to have anything done further,  but recently he has become more weak and has changed his mind.   CURRENT MEDICATIONS:  1. Enteric-coated aspirin 325 mg a day.  2. Altace 2.5 mg p.o. b.i.d.  3. Lipitor 40 mg a day.  4. Niaspan 500 mg q.h.s.  5. Coreg 12.5 mg p.o. b.i.d.  6. Glucosamine once a day.  7. Coumadin 2.5 mg five days a week with 5 mg two days a week.  8. Multivitamin once a day.  9. Flomax 0.4 mg a day.  10.      Proscar once a day.   ALLERGIES:  None.   PAST  MEDICAL HISTORY:  1. Significant for a history of coronary artery disease.  He is status post     percutaneous transluminal coronary angioplasty and stenting of his left     anterior descending artery in February of 2001.  It re-occluded sub     acutely, approximately one week later, and he had a repeat PTCA and     stenting by Dr. Francisca December for subacute thrombosis. A followup heart     catheterization in February of 2004 revealed severe in-stent restenosis     in the proximal segments.  2. Hyperlipidemia.  3. Congestive heart failure.  4. Prostatic enlargement.   SOCIAL HISTORY:  The patient does not smoke and does not  drink alcohol.   FAMILY HISTORY:  Noncontributory.   REVIEW OF SYSTEMS:  Reviewed and is essentially negative.   PHYSICAL EXAMINATION:  GENERAL:  This is a middle-aged gentleman in no acute  distress.  He is alert and oriented x3, and his mood and affect are normal.  VITAL SIGNS:  Weight 220, blood pressure 110/70, heart rate is 60.  HEENT:  Reveals 2+ carotids.  He has no bruits.  There is no JVD, no  thyromegaly.  LUNGS:  Clear to auscultation.  HEART:  Regular rate, S1, S2, no murmur, gallop or rub.  ABDOMEN:  Good bowel sounds, nontender.  EXTREMITIES:  He has no cyanosis, clubbing or edema.  NEUROLOGIC:  Exam is nonfocal.   ASSESSMENT/PLAN:  Mr. Duve presents with worsening chest pain and weakness  as well as some indigestion with exertion.  I suspect that some of these  symptoms are due to ischemia and/or his ischemic cardiomyopathy.  He has  agreed to have PTCA and stenting of his LAD.  We will proceed with left-  heart catheterization with probable intervention.  I proposed for [place] to do a  rotational atherectomy followed by a drug-eluting stent placement to the  proximal LAD.  We have discussed the risks, benefits and options.  He  understands, and agrees to proceed.                                                Vesta Mixer, M.D.     PJN/MEDQ  D:  10/15/2003  T:  10/16/2003  Job:  [id number]   cc:   Windle Guard, M.D.  [address]  [place], [place] [address]  Fax: [phone]   Duke Salvia, M.D.

## 2011-01-23 NOTE — H&P (Signed)
[facility] [place]  Patient:    [name], [name]                       MRN: [id number] Adm. Date:  [id number] Attending:  Koren Bound Dictator:   Lavonia Dana, M.D.                         History and Physical  PROBLEM LIST: 1. Subacute thrombosis, proximal left anterior descending artery stent originally    placed February 20. 2. One-vessel coronary artery disease.  MEDICATIONS: 1. Enteric-coated aspirin 325 mg q.d. 2. Plavix 75 mg q.d. 3. Nitroglycerin sublingual 0.4 mg p.r.n. 4. Toprol XL 50 mg q.d. 5. Altace 2.5 mg b.i.d. 6. Zocor 40 mg q.d.  HISTORY OF PRESENT ILLNESS:  A 70 year old white male had acute anterior wall MI on February 20 and had PTCA with proximal LAD stent at that time.  He then presented to [facility] Emergency Room with ST elevations V1 through V5 and suspected thrombosis of previous stent.  He was emergently taken to the catheterization lab where his stent was shown to be completely occluded.  It was reopened and a new stent deployed inside the old one per Dr. Amil Amen.  The patient was stable and sent to the cardiac critical care unit.  PAST MEDICAL HISTORY:  BPH as above.  PAST SURGICAL HISTORY:  Appendectomy.  SOCIAL HISTORY:  No tobacco use, no alcohol use.  Married with two children. He is a retired Runner, broadcasting/film/video.  FAMILY HISTORY:  Positive early coronary artery disease in brother who had an MI at 44 and three brothers with coronary artery disease and a sister with coronary artery disease.  Positive for hypertension, no cancer.  REVIEW OF SYSTEMS:  Unable to obtain.  PHYSICAL EXAMINATION:  VITAL SIGNS:  Temperature 97, blood pressure 158/102, pulse 78, 100% on O2.  GENERAL: He was alert, groggy, answering questions appropriately after emergent  catheterization.  NECK:  No JVD.  CHEST:  Clear to auscultation bilaterally.  CARDIOVASCULAR:  Normal S1, S2.  ABDOMEN:  Soft.  Positive bowel  sounds.  EXTREMITIES:  No edema.  NEUROLOGIC:  Nonfocal.  LABORATORY DATA:  EKG with ST elevations V1 through V5 with ST reciprocal depression in II, III, aVF, also ST elevation I and aVL, normal sinus rhythm.  ASSESSMENT:  Subacute thrombosis of proximal left anterior descending artery stent status post percutaneous transluminal coronary angioplasty with restent within he previous stent, having to occlude diagonal branch with new stent.  The patient tolerated the procedure well and was taken to CCU.  PLAN: 1. Continue nitroglycerin over 24 hours. 2. Continue Integrilin over 24 hours. 3. Enteric-coated aspirin, beta blocker, ACE inhibitor. 4. Continue Plavix 2 weeks. 5. CCU monitoring. 6. Follow serial enzymes. DD:  11/02/99 TD:  11/03/99 Job: [id number]

## 2011-02-19 ENCOUNTER — Ambulatory Visit (INDEPENDENT_AMBULATORY_CARE_PROVIDER_SITE_OTHER): Payer: Medicare Other | Admitting: *Deleted

## 2011-02-19 DIAGNOSIS — Z7901 Long term (current) use of anticoagulants: Secondary | ICD-10-CM

## 2011-03-23 ENCOUNTER — Ambulatory Visit (INDEPENDENT_AMBULATORY_CARE_PROVIDER_SITE_OTHER): Payer: Medicare Other | Admitting: *Deleted

## 2011-03-23 DIAGNOSIS — Z7901 Long term (current) use of anticoagulants: Secondary | ICD-10-CM

## 2011-03-30 ENCOUNTER — Telehealth: Payer: Self-pay | Admitting: Cardiovascular Disease

## 2011-03-30 NOTE — Telephone Encounter (Signed)
PT WANTS TO SW NURSE ABOUT [facility] SCREENING.

## 2011-03-30 NOTE — Telephone Encounter (Signed)
Patient called, wanted to know if dr Ian Bushman would recommend him to have screening tests. i spoke with dr Ian Bushman and he said it was up to the pt but he has had an ekg, echo and cxr this year, medicare wont pay for the screening and if he is having a problem he can order the test so medicare will pay if a test is needed. i left a msg for him the second call back.

## 2011-03-30 NOTE — Telephone Encounter (Signed)
Wants RN to call him back regarding [facility] Screening.

## 2011-04-20 ENCOUNTER — Encounter: Payer: Self-pay | Admitting: Cardiovascular Disease

## 2011-04-20 ENCOUNTER — Ambulatory Visit (INDEPENDENT_AMBULATORY_CARE_PROVIDER_SITE_OTHER): Payer: Medicare Other | Admitting: Cardiovascular Disease

## 2011-04-20 ENCOUNTER — Ambulatory Visit (INDEPENDENT_AMBULATORY_CARE_PROVIDER_SITE_OTHER): Payer: Medicare Other | Admitting: *Deleted

## 2011-04-20 VITALS — BP 110/60 | HR 60 | Ht 72.0 in | Wt 216.0 lb

## 2011-04-20 DIAGNOSIS — I509 Heart failure, unspecified: Secondary | ICD-10-CM

## 2011-04-20 DIAGNOSIS — E7800 Pure hypercholesterolemia, unspecified: Secondary | ICD-10-CM

## 2011-04-20 NOTE — Assessment & Plan Note (Signed)
[name] is doing well. Since I last seen him come he's had a hold his lisinopril and several occasions because of lightheadedness. He typically restarts lisinopril several days later. We'll continue his current medical regimen.  I'll see him again in 3 months. We'll check a basic metabolic profile at that time. Wouldn't we were not able to get the blood today because of some venous access problems.

## 2011-04-20 NOTE — Progress Notes (Signed)
[name] Date of Birth  1941-06-06 [facility] [address]     [address] [place], [place]  [address] [phone]  Fax  [phone]  History of Present Illness:  [name] is a 70 year old gentleman with a history of coronary disease and previous anterior wall myocardial infarction.  He has a history of congestive heart failure. His ejection fraction is 30-35%. He has a biventricular pacemaker/AICD. He also has a history of dyslipidemia. He has severe benign prostatic hypertrophy.   He continues to have some episodes of low blood pressure. He has occasional episodes of lightheadedness. We've had to hold lisinopril on several occasions because of his lightheadedness.                                                                                                                                                                                                             Current Outpatient Prescriptions on File Prior to Visit  Medication Sig Dispense Refill  . aspirin 81 MG EC tablet Take 81 mg by mouth daily.        . carvedilol (COREG) 12.5 MG tablet Take 12.5 mg by mouth 2 (two) times daily with a meal.        . glucosamine-chondroitin 500-400 MG tablet Take 1 tablet by mouth daily.        [name] lisinopril (PRINIVIL,ZESTRIL) 5 MG tablet Take 5 mg by mouth daily.        . Multiple Vitamin (MULTIVITAMIN) tablet Take 1 tablet by mouth daily.        . Omega-3 Fatty Acids (FISH OIL) 1200 MG CAPS Take by mouth 2 (two) times daily.        [name] warfarin (COUMADIN) 5 MG tablet TAKE AS DIRECTED  90 tablet  2  . finasteride (PROSCAR) 5 MG tablet Take 5 mg by mouth daily.          Allergies  Allergen Reactions  . Analgesic (Aspirin-Caffeine)     Bladder shut down  . Diphenhydramine Hcl   . Pseudoephedrine     Past Medical History  Diagnosis Date  . Dyslipidemia   . Gout   . BPH (benign prostatic hypertrophy)     SEVERE  . CHF (congestive heart  failure)     EJECTION FRACTION 30-35%  . Coronary artery disease     STATUS POST ANTERIOR WALL MYOCARDIAL INFARCTION. HE IS STATUS POST PTCA AND STENTING OF HIS LAD. HIS INITIAL SET WAS COMPLICATED BY SUBACUTE THROMBOSIS RESULTING IN A LARGE ANTERIOR WALL  MYOCARDIAL INFARCTIONAND SUDSEQUENT CHF    Past Surgical History  Procedure Date  . Insert / replace / remove pacemaker     STATUS POST BIVENTRICULAR PACEMAKER/AICD  . Cardiac catheterization 2005    History  Smoking status  . Never Smoker   Smokeless tobacco  . Not on file    History  Alcohol Use No    No family history on file.  Reviw of Systems:  Reviewed in the HPI.  All other systems are negative.  Physical Exam: BP 110/60  Pulse 60  Ht 6' (1.829 m)  Wt 216 lb (97.977 kg)  BMI 29.29 kg/m2 The patient is alert and oriented x 3.  The mood and affect are normal.   Skin: warm and dry.  Color is normal.    HEENT:   the sclera are nonicteric.  The mucous membranes are moist.  The carotids are 2+ without bruits.  There is no thyromegaly.  There is no JVD.    Lungs: clear.  The chest wall is non tender.    Heart: regular rate with a normal S1 and S2.  There are no murmurs, gallops, or rubs. The PMI is not displaced.     Abdomen: good bowel sounds.  There is no guarding or rebound.  There is no hepatosplenomegaly or tenderness.  There are no masses.   Extremities:  no clubbing, cyanosis, or edema.  The legs are without rashes.  The distal pulses are intact.   Neuro:  Cranial nerves II - XII are intact.  Motor and sensory functions are intact.    The gait is normal.  ECG:  Assessment / Plan:

## 2011-05-26 ENCOUNTER — Telehealth: Payer: Self-pay

## 2011-05-26 ENCOUNTER — Encounter: Payer: Self-pay | Admitting: Cardiovascular Disease

## 2011-05-26 NOTE — Telephone Encounter (Signed)
Pt called stating he has an appt with Dr Elease Hashimoto on 07/21/11 and wants to know if he needs an INR prior to that appt. Attempted to call pt back.  LMOM advised pt he needs to call and schedule an INR check ASAP as he is already 2 months past due for Coumadin f/u.

## 2011-05-27 ENCOUNTER — Ambulatory Visit (INDEPENDENT_AMBULATORY_CARE_PROVIDER_SITE_OTHER): Payer: Medicare Other | Admitting: *Deleted

## 2011-05-27 DIAGNOSIS — Z7901 Long term (current) use of anticoagulants: Secondary | ICD-10-CM

## 2011-05-27 LAB — POCT INR: INR: 2.5

## 2011-05-27 NOTE — Telephone Encounter (Signed)
Pt called and spoke with [facility].  Made appt to have INR checked this week.

## 2011-06-10 ENCOUNTER — Telehealth: Payer: Self-pay | Admitting: Cardiovascular Disease

## 2011-06-10 NOTE — Telephone Encounter (Signed)
LMTCB will forward to Dr. Harvie Bridge nurse. Mylo Red RN

## 2011-06-10 NOTE — Telephone Encounter (Signed)
Will forward to Dr Elease Hashimoto for advise.

## 2011-06-10 NOTE — Telephone Encounter (Signed)
Pt wants to make sure he will be ok for cool thermo therapy with [facility] by Nov 26th

## 2011-06-11 NOTE — Telephone Encounter (Signed)
Called dr Rachael Darby office/ [facility]. Spoke with Perry, I enquired if pt needed cardiac clearance for cooling therapy. She was unsure and they will contact us at later date if needed. FYI/ Dr Elease Hashimoto did give clearance in earlier note. Alfonso Ramus RN

## 2011-06-24 ENCOUNTER — Ambulatory Visit (INDEPENDENT_AMBULATORY_CARE_PROVIDER_SITE_OTHER): Payer: Medicare Other | Admitting: *Deleted

## 2011-06-24 DIAGNOSIS — Z7901 Long term (current) use of anticoagulants: Secondary | ICD-10-CM

## 2011-06-24 LAB — POCT INR: INR: 2.4

## 2011-07-21 ENCOUNTER — Ambulatory Visit (INDEPENDENT_AMBULATORY_CARE_PROVIDER_SITE_OTHER): Payer: Medicare Other | Admitting: *Deleted

## 2011-07-21 ENCOUNTER — Other Ambulatory Visit: Payer: Medicare Other | Admitting: *Deleted

## 2011-07-21 ENCOUNTER — Ambulatory Visit (INDEPENDENT_AMBULATORY_CARE_PROVIDER_SITE_OTHER): Payer: Medicare Other | Admitting: Cardiovascular Disease

## 2011-07-21 ENCOUNTER — Encounter: Payer: Self-pay | Admitting: Cardiovascular Disease

## 2011-07-21 DIAGNOSIS — E785 Hyperlipidemia, unspecified: Secondary | ICD-10-CM

## 2011-07-21 DIAGNOSIS — I4891 Unspecified atrial fibrillation: Secondary | ICD-10-CM

## 2011-07-21 DIAGNOSIS — I509 Heart failure, unspecified: Secondary | ICD-10-CM

## 2011-07-21 DIAGNOSIS — I10 Essential (primary) hypertension: Secondary | ICD-10-CM

## 2011-07-21 DIAGNOSIS — Z7901 Long term (current) use of anticoagulants: Secondary | ICD-10-CM

## 2011-07-21 DIAGNOSIS — I2510 Atherosclerotic heart disease of native coronary artery without angina pectoris: Secondary | ICD-10-CM

## 2011-07-21 DIAGNOSIS — I5022 Chronic systolic (congestive) heart failure: Secondary | ICD-10-CM

## 2011-07-21 LAB — HEPATIC FUNCTION PANEL
Albumin: 3.8 g/dL (ref 3.5–5.2)
Alkaline Phosphatase: 68 U/L (ref 39–117)
Total Bilirubin: 1.2 mg/dL (ref 0.3–1.2)

## 2011-07-21 LAB — BASIC METABOLIC PANEL
GFR: 84.95 mL/min (ref >60.00)
Potassium: 4.6 mEq/L (ref 3.5–5.1)
Sodium: 136 mEq/L (ref 135–145)

## 2011-07-21 NOTE — Assessment & Plan Note (Signed)
We will check a lipid profile today and again in 6 months.

## 2011-07-21 NOTE — Patient Instructions (Signed)
Your physician wants you to follow-up in: 6 months You will receive a reminder letter in the mail two months in advance. If you don't receive a letter, please call our office to schedule the follow-up appointment.  Your physician recommends that you have FASTING lipid profile: today

## 2011-07-21 NOTE — Progress Notes (Signed)
[name] Date of Birth  05-17-1941 [facility] [address]     [address] [place], [place]  [address] [phone]  Fax  [phone]  History of Present Illness:  [name] is a 70 year old gentleman with a history of coronary disease and previous anterior wall myocardial infarction.  He has a history of congestive heart failure. His ejection fraction is 30-35%.  He has been on coumadin since his large anterior MI and subsequent CHF.    He has a biventricular pacemaker/AICD. He also has a history of dyslipidemia. He has severe benign prostatic hypertrophy.  He denies any cardiac complaints today. He is scheduled to have cryotherapy of his prostate for treatment of his BPH.                                                                                                                                                                                                           Current Outpatient Prescriptions on File Prior to Visit  Medication Sig Dispense Refill  . aspirin 81 MG EC tablet Take 81 mg by mouth daily.        . carvedilol (COREG) 12.5 MG tablet Take 12.5 mg by mouth 2 (two) times daily with a meal.        . finasteride (PROSCAR) 5 MG tablet Take 5 mg by mouth daily.        [name] glucosamine-chondroitin 500-400 MG tablet Take 1 tablet by mouth daily.        [name] lisinopril (PRINIVIL,ZESTRIL) 5 MG tablet Take 5 mg by mouth daily.        . Multiple Vitamin (MULTIVITAMIN) tablet Take 1 tablet by mouth daily.        . Omega-3 Fatty Acids (FISH OIL) 1200 MG CAPS Take by mouth 2 (two) times daily.        [name] warfarin (COUMADIN) 5 MG tablet TAKE AS DIRECTED  90 tablet  2    Allergies  Allergen Reactions  . Analgesic (Aspirin-Caffeine)     Bladder shut down  . Diphenhydramine Hcl   . Pseudoephedrine     Past Medical History  Diagnosis Date  . Dyslipidemia   . Gout   . BPH (benign prostatic hypertrophy)     SEVERE  . CHF (congestive heart  failure)     EJECTION FRACTION 30-35%  . Coronary artery disease     STATUS POST ANTERIOR WALL MYOCARDIAL INFARCTION. HE IS STATUS POST PTCA AND STENTING OF HIS LAD. HIS INITIAL SET WAS COMPLICATED BY SUBACUTE THROMBOSIS  RESULTING IN A LARGE ANTERIOR WALL MYOCARDIAL INFARCTIONAND SUDSEQUENT CHF.  He has been on coumadin since that time.    Past Surgical History  Procedure Date  . Insert / replace / remove pacemaker     STATUS POST BIVENTRICULAR PACEMAKER/AICD  . Cardiac catheterization 2005    History  Smoking status  . Never Smoker   Smokeless tobacco  . Not on file    History  Alcohol Use No    No family history on file.  Reviw of Systems:  Reviewed in the HPI.  All other systems are negative.  Physical Exam: BP 110/74  Pulse 60  Ht 6' (1.829 m)  Wt 214 lb 1.9 oz (97.124 kg)  BMI 29.04 kg/m2 The patient is alert and oriented x 3.  The mood and affect are normal.   Skin: warm and dry.  Color is normal.    HEENT:   the sclera are nonicteric.  The mucous membranes are moist.  The carotids are 2+ without bruits.  There is no thyromegaly.  There is no JVD.    Lungs: clear.  The chest wall is non tender.    Heart: regular rate with a normal S1 and S2.  There are no murmurs, gallops, or rubs. The PMI is not displaced.     Abdomen: good bowel sounds.  There is no guarding or rebound.  There is no hepatosplenomegaly or tenderness.  There are no masses.   Extremities:  no clubbing, cyanosis, or edema.  The legs are without rashes.  The distal pulses are intact.   Neuro:  Cranial nerves II - XII are intact.  Motor and sensory functions are intact.    The gait is normal.  ECG: AV sequential pacing.  Assessment / Plan:

## 2011-07-21 NOTE — Assessment & Plan Note (Signed)
He's doing very well from a congestive heart failure standpoint. His ejection fraction is around 35%.  We will dissipate getting an echocardiogram at some point in the near future.  He's not having any exacerbation of his symptoms. He is at low risk for his upcoming prostate surgery.  We will hold his Coumadin for a week prior to the procedure if needed.

## 2011-07-21 NOTE — Assessment & Plan Note (Signed)
He is scheduled to  have cryotherapy with Dr. Patsi Sears later this month.  He is clear for that procedure. He is at low risk for any cardiovascular consultations. We will allow him to hold his Coumadin for 5 days prior to the procedure if this is needed by the urologist.

## 2011-07-22 LAB — LIPID PANEL
HDL: 35.7 mg/dL — ABNORMAL LOW (ref >39.00)
Total CHOL/HDL Ratio: 3
Triglycerides: 127 mg/dL (ref 0.0–149.0)

## 2011-07-31 ENCOUNTER — Other Ambulatory Visit: Payer: Self-pay | Admitting: Cardiovascular Disease

## 2011-09-02 ENCOUNTER — Ambulatory Visit (INDEPENDENT_AMBULATORY_CARE_PROVIDER_SITE_OTHER): Payer: Medicare Other | Admitting: *Deleted

## 2011-09-02 DIAGNOSIS — Z7901 Long term (current) use of anticoagulants: Secondary | ICD-10-CM

## 2011-09-02 DIAGNOSIS — I4891 Unspecified atrial fibrillation: Secondary | ICD-10-CM

## 2011-10-07 ENCOUNTER — Encounter: Payer: Self-pay | Admitting: *Deleted

## 2011-10-12 ENCOUNTER — Other Ambulatory Visit: Payer: Self-pay | Admitting: Urology

## 2011-10-14 ENCOUNTER — Ambulatory Visit (INDEPENDENT_AMBULATORY_CARE_PROVIDER_SITE_OTHER): Payer: Medicare Other | Admitting: *Deleted

## 2011-10-14 DIAGNOSIS — I4891 Unspecified atrial fibrillation: Secondary | ICD-10-CM

## 2011-10-14 DIAGNOSIS — Z7901 Long term (current) use of anticoagulants: Secondary | ICD-10-CM

## 2011-10-14 LAB — POCT INR: INR: 2.4

## 2011-10-15 ENCOUNTER — Encounter: Payer: Self-pay | Admitting: *Deleted

## 2011-10-15 ENCOUNTER — Telehealth: Payer: Self-pay | Admitting: Cardiovascular Disease

## 2011-10-15 NOTE — Telephone Encounter (Signed)
Pt needs to know when his cath, echo and myoview was done last because os surgery on march 1st

## 2011-10-15 NOTE — Telephone Encounter (Signed)
Pt called with information

## 2011-10-26 ENCOUNTER — Ambulatory Visit (INDEPENDENT_AMBULATORY_CARE_PROVIDER_SITE_OTHER): Payer: Medicare Other | Admitting: *Deleted

## 2011-10-26 ENCOUNTER — Encounter: Payer: Self-pay | Admitting: Internal Medicine

## 2011-10-26 DIAGNOSIS — I4891 Unspecified atrial fibrillation: Secondary | ICD-10-CM

## 2011-10-26 DIAGNOSIS — I495 Sick sinus syndrome: Secondary | ICD-10-CM

## 2011-10-26 DIAGNOSIS — I509 Heart failure, unspecified: Secondary | ICD-10-CM

## 2011-10-26 DIAGNOSIS — I5022 Chronic systolic (congestive) heart failure: Secondary | ICD-10-CM

## 2011-10-26 LAB — ICD DEVICE OBSERVATION
ATRIAL PACING ICD: 76.11 pct
BAMS-0001: 170 {beats}/min
CHARGE TIME: 10.109 s
FVT: 0
PACEART VT: 0
RV LEAD IMPEDENCE ICD: 494 Ohm
RV LEAD THRESHOLD: 0.75 V
TOT-0001: 0
TZAT-0001ATACH: 3
TZAT-0002ATACH: NEGATIVE
TZAT-0002ATACH: NEGATIVE
TZAT-0002ATACH: NEGATIVE
TZAT-0012ATACH: 150 ms
TZAT-0018ATACH: NEGATIVE
TZAT-0019ATACH: 6 V
TZAT-0019ATACH: 6 V
TZAT-0019SLOWVT: 8 V
TZAT-0020ATACH: 1.5 ms
TZAT-0020FASTVT: 1.5 ms
TZAT-0020SLOWVT: 1.5 ms
TZON-0003SLOWVT: 360 ms
TZON-0005SLOWVT: 12
TZST-0001ATACH: 5
TZST-0001ATACH: 6
TZST-0001FASTVT: 2
TZST-0001FASTVT: 3
TZST-0001SLOWVT: 3
TZST-0001SLOWVT: 5
TZST-0001SLOWVT: 6
TZST-0002ATACH: NEGATIVE
TZST-0002ATACH: NEGATIVE
TZST-0002ATACH: NEGATIVE
TZST-0002FASTVT: NEGATIVE
TZST-0002SLOWVT: NEGATIVE

## 2011-10-26 NOTE — Progress Notes (Signed)
ICD check with ICM 

## 2011-10-28 ENCOUNTER — Encounter (HOSPITAL_COMMUNITY): Payer: Self-pay | Admitting: Pharmacy Technician

## 2011-10-29 ENCOUNTER — Encounter (HOSPITAL_COMMUNITY): Payer: Self-pay

## 2011-10-29 ENCOUNTER — Encounter (HOSPITAL_COMMUNITY)
Admission: RE | Admit: 2011-10-29 | Discharge: 2011-10-29 | Disposition: A | Discharge status: Home / Self Care | Payer: Medicare Other | Source: Ambulatory Visit | Attending: Urology | Admitting: Urology

## 2011-10-29 ENCOUNTER — Ambulatory Visit (HOSPITAL_COMMUNITY)
Admission: RE | Admit: 2011-10-29 | Discharge: 2011-10-29 | Disposition: A | Discharge status: Home / Self Care | Payer: Medicare Other | Source: Ambulatory Visit | Attending: Urology | Admitting: Urology

## 2011-10-29 DIAGNOSIS — I4891 Unspecified atrial fibrillation: Secondary | ICD-10-CM | POA: Insufficient documentation

## 2011-10-29 DIAGNOSIS — N320 Bladder-neck obstruction: Secondary | ICD-10-CM | POA: Insufficient documentation

## 2011-10-29 DIAGNOSIS — I2510 Atherosclerotic heart disease of native coronary artery without angina pectoris: Secondary | ICD-10-CM | POA: Insufficient documentation

## 2011-10-29 DIAGNOSIS — N138 Other obstructive and reflux uropathy: Secondary | ICD-10-CM | POA: Insufficient documentation

## 2011-10-29 DIAGNOSIS — I509 Heart failure, unspecified: Secondary | ICD-10-CM | POA: Insufficient documentation

## 2011-10-29 DIAGNOSIS — I10 Essential (primary) hypertension: Secondary | ICD-10-CM | POA: Insufficient documentation

## 2011-10-29 DIAGNOSIS — Z950 Presence of cardiac pacemaker: Secondary | ICD-10-CM | POA: Insufficient documentation

## 2011-10-29 DIAGNOSIS — I252 Old myocardial infarction: Secondary | ICD-10-CM | POA: Insufficient documentation

## 2011-10-29 DIAGNOSIS — N401 Enlarged prostate with lower urinary tract symptoms: Secondary | ICD-10-CM | POA: Insufficient documentation

## 2011-10-29 DIAGNOSIS — Z01812 Encounter for preprocedural laboratory examination: Secondary | ICD-10-CM | POA: Insufficient documentation

## 2011-10-29 HISTORY — DX: Unspecified osteoarthritis, unspecified site: M19.90

## 2011-10-29 HISTORY — DX: Chronic kidney disease, unspecified: N18.9

## 2011-10-29 LAB — CBC
Hemoglobin: 13.9 g/dL (ref 13.0–17.0)
MCH: 30.8 pg (ref 26.0–34.0)
RBC: 4.52 MIL/uL (ref 4.22–5.81)

## 2011-10-29 LAB — BASIC METABOLIC PANEL
CO2: 25 mEq/L (ref 19–32)
Calcium: 9.2 mg/dL (ref 8.4–10.5)
Glucose, Bld: 129 mg/dL — ABNORMAL HIGH (ref 70–99)
Sodium: 135 mEq/L (ref 135–145)

## 2011-10-29 LAB — PROTIME-INR: INR: 1.38 (ref 0.00–1.49)

## 2011-10-29 LAB — SURGICAL PCR SCREEN: MRSA, PCR: NEGATIVE

## 2011-10-29 LAB — APTT: aPTT: 32 seconds (ref 24–37)

## 2011-10-29 IMAGING — CR DG CHEST 2V
2 series · 2 of 2 positions shown · non-contrast
Comparison: [DATE]

CLINICAL DATA: Preop for prostatectomy

CHEST - 2 VIEW

[w chest pa]
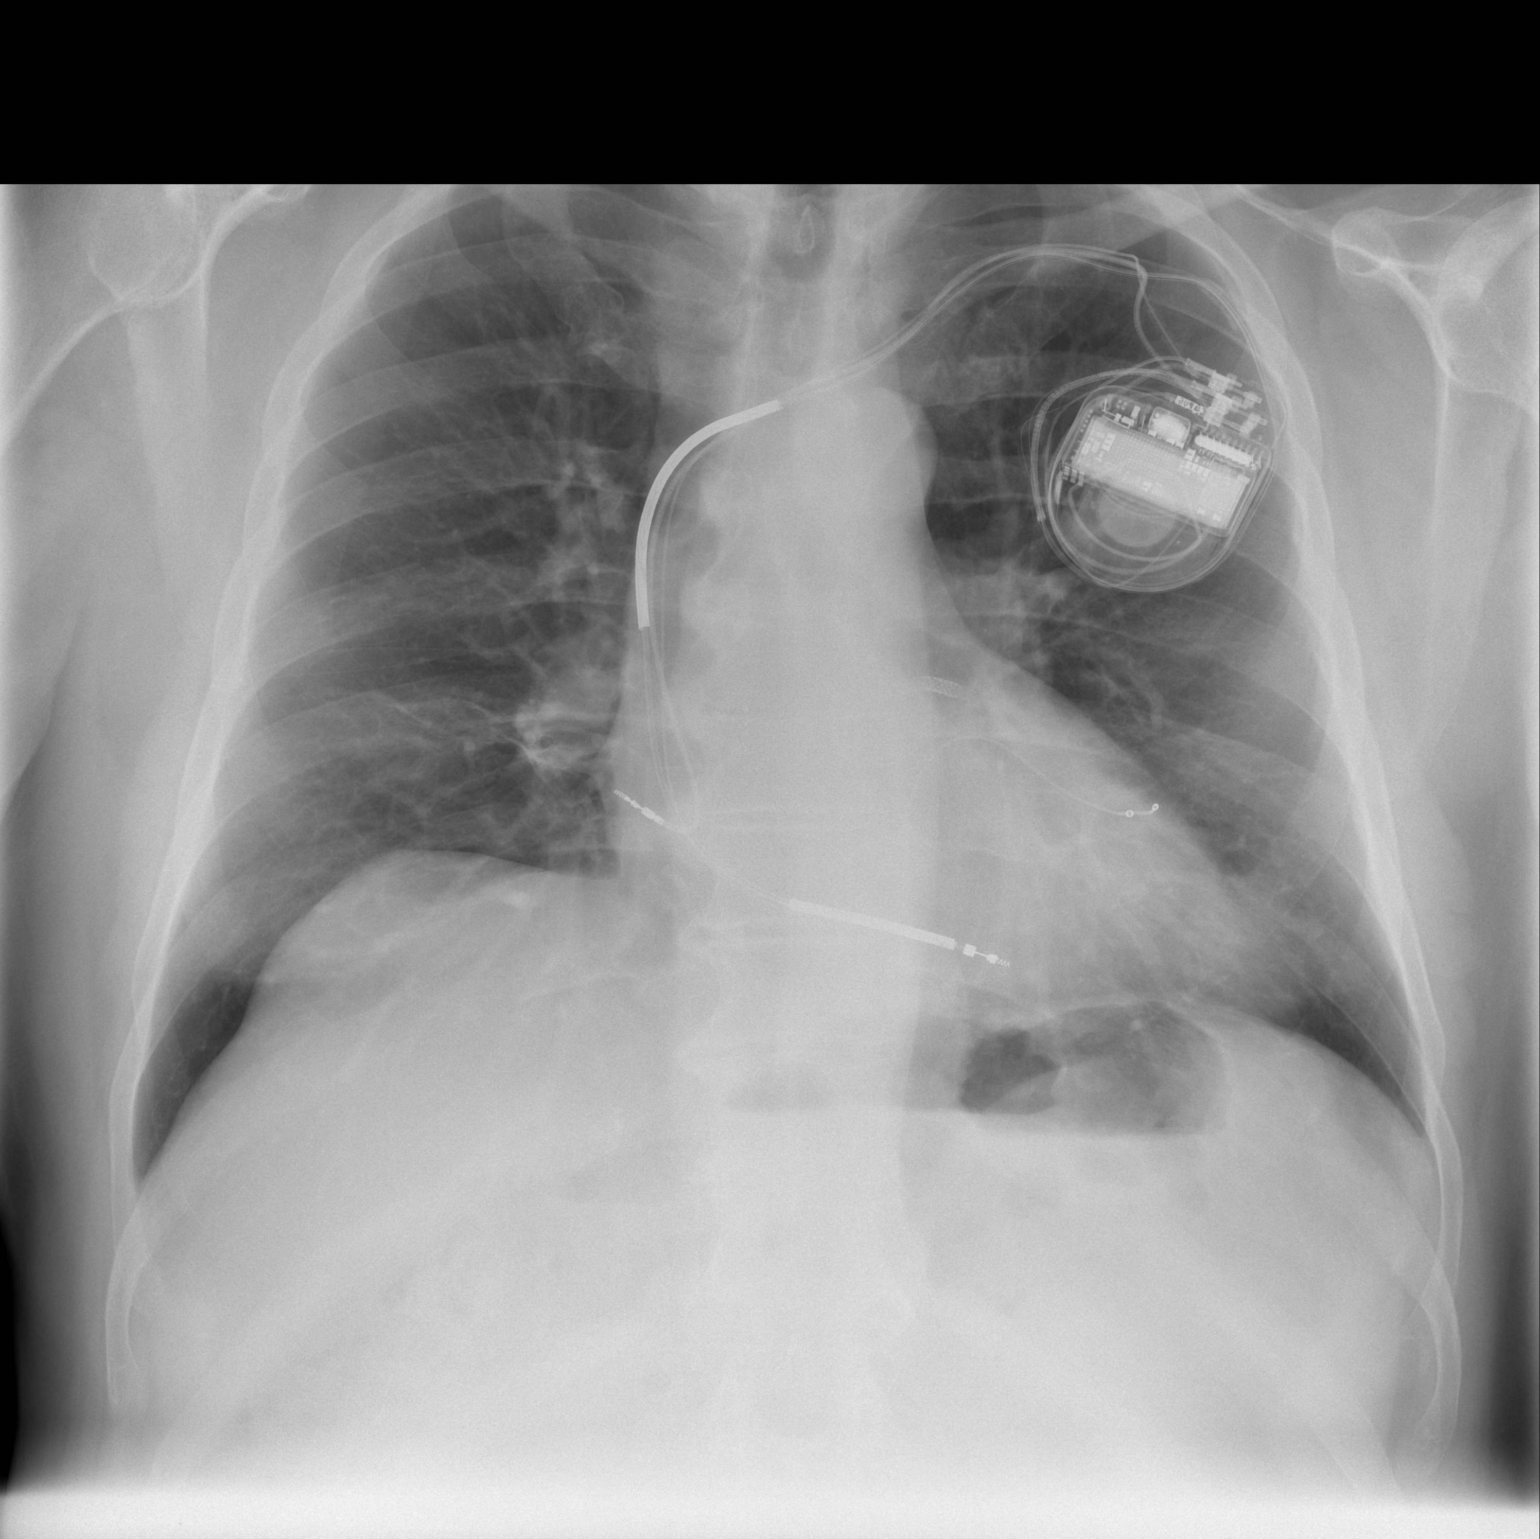

[w chest lat]
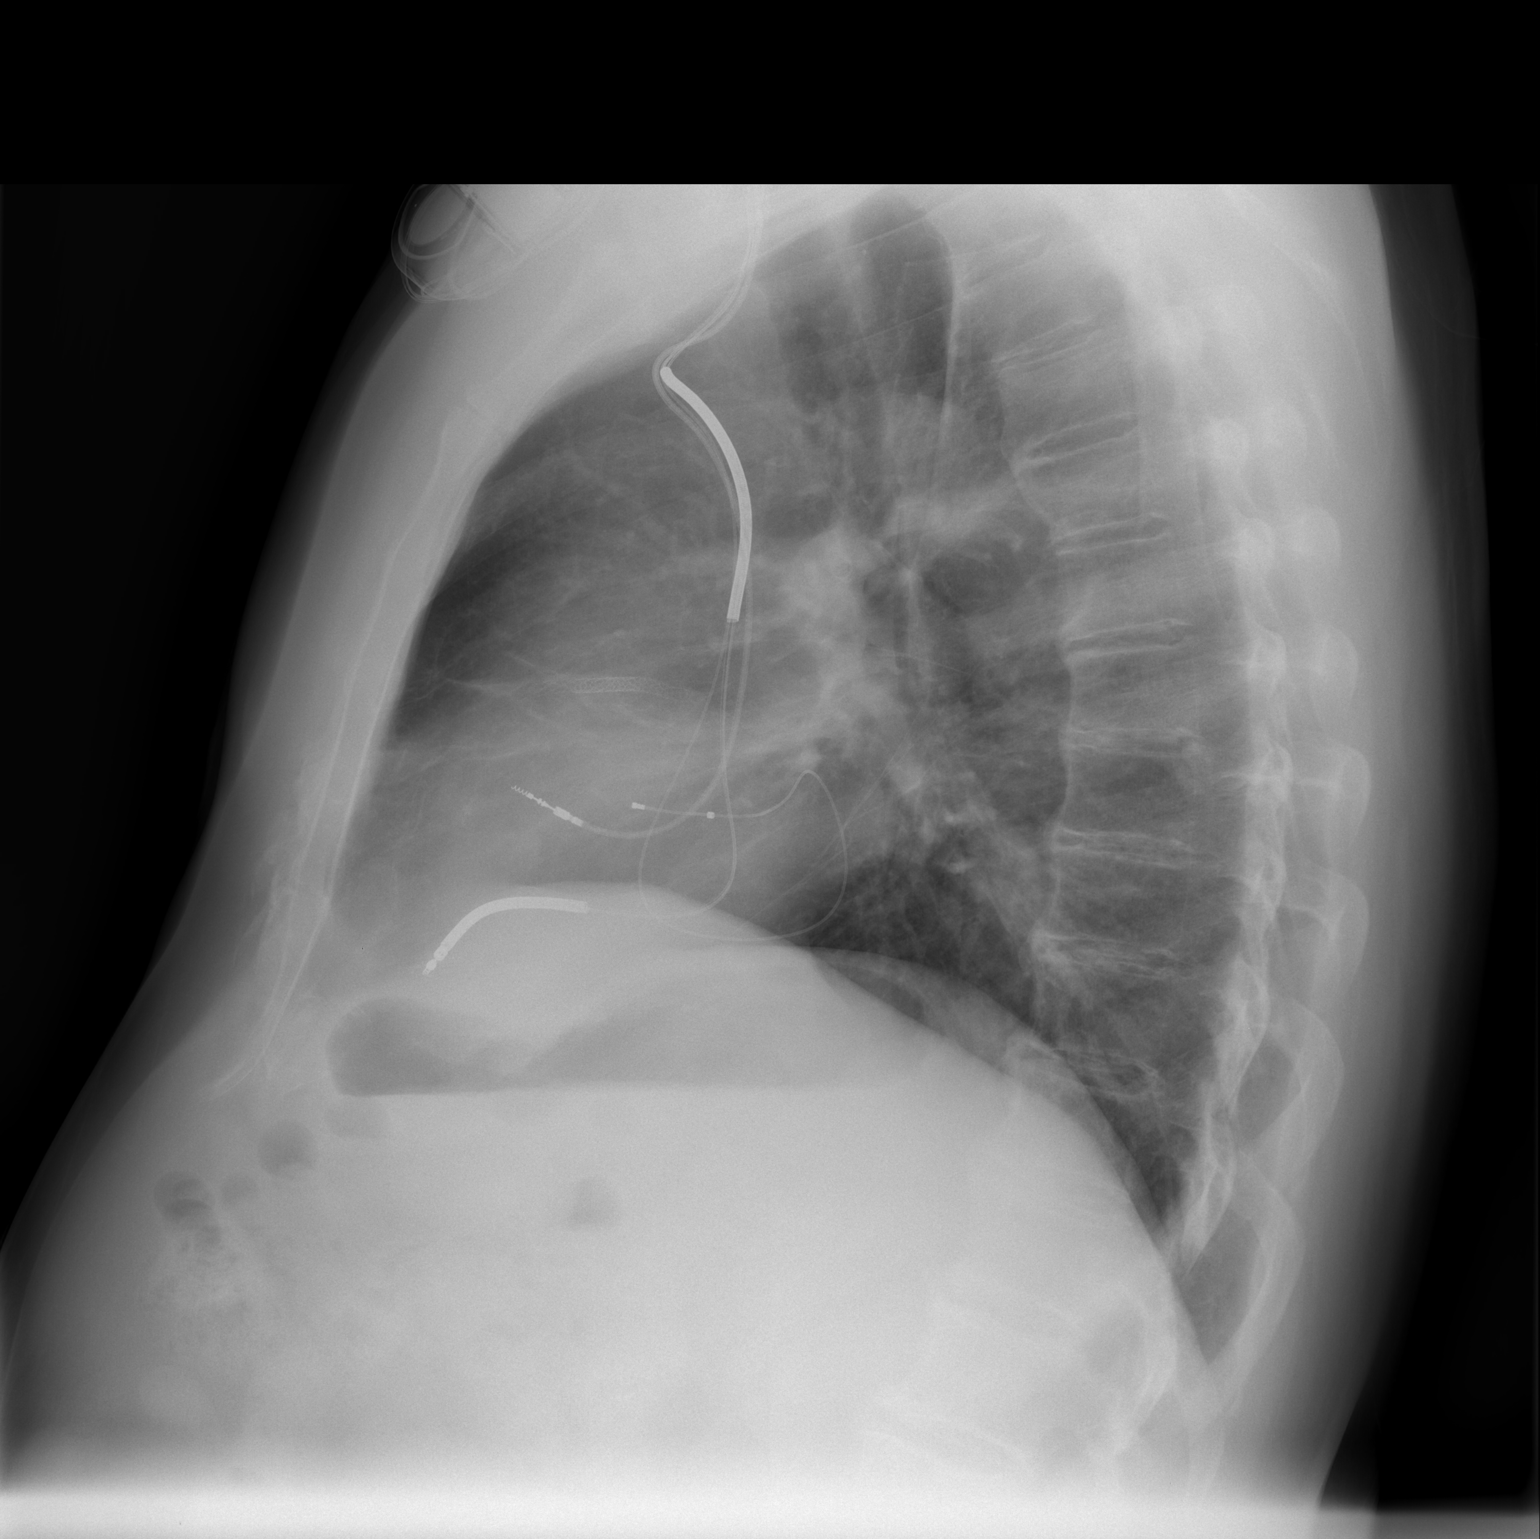

[2 of 2 positions shown; findings below may reference images not displayed]

FINDINGS: Cardiomediastinal silhouette is stable.  Three leads
cardiac pacemaker is unchanged in position.  Mild elevation of the
right hemidiaphragm again noted.  No acute infiltrate or pulmonary
edema.  Stable mild degenerative changes thoracic spine.
IMPRESSION: No active disease.  No significant change.

## 2011-10-29 MED ORDER — CEFAZOLIN SODIUM 1-5 GM-% IV SOLN: 1.0000 g | INTRAVENOUS | Status: DC

## 2011-10-29 NOTE — Pre-Procedure Instructions (Signed)
11/12 LOV with Dr Elease Hashimoto ( cardiology )  10/26/11 Last device check  2011 - Last ECHO in EPIC

## 2011-10-29 NOTE — Pre-Procedure Instructions (Signed)
10/29/11 Left message with Lossie Faes at [facility] Dr Marcello Fennel that pt had some questions for Dr Marcello Fennel prior to surgery.  Lossie Faes stated that she would let Dr Marcello Fennel be aware.  Pt also stated he would like picture of prostate once removed if possible.

## 2011-10-29 NOTE — Patient Instructions (Signed)
20 [name]  10/29/2011   Your procedure is scheduled on:  11/06/11 0830am-1100am  Report to [facility] Stay [facility] at 0630 AM.  Call this number if you have problems the morning of surgery: [phone]   Remember:   Do not eat food:After Midnight.  May have clear liquids:until Midnight .    Take these medicines the morning of surgery with A SIP OF WATER:    Do not wear jewelry, .  Do not wear lotions, powders, or perfumes    Do not bring valuables to the hospital.  Contacts, dentures or bridgework may not be worn into surgery.  Leave suitcase in the car. After surgery it may be brought to your room.  For patients admitted to the hospital, checkout time is 11:00 AM the day of discharge.      Special Instructions: CHG Shower Use Special Wash: 1/2 bottle night before surgery and 1/2 bottle morning of surgery. Shower chin to toes with CHG.  Wash face and private parts with regular soap.     Please read over the following fact sheets that you were given: MRSA Information, coughing and deep breathing exercises, leg exercises

## 2011-11-03 NOTE — Pre-Procedure Instructions (Signed)
11/03/11 [name] left her a voice mail message regarding Tylenol allergy and order is in EPIC for [employer]..  Tylenol causes bladder to shut down per pt  As listed in medical record.

## 2011-11-04 ENCOUNTER — Encounter (HOSPITAL_COMMUNITY): Payer: Self-pay

## 2011-11-04 MED ORDER — ACETAMINOPHEN 10 MG/ML IV SOLN: 1000.0000 mg | Freq: Four times a day (QID) | INTRAVENOUS | Status: DC

## 2011-11-04 NOTE — Pre-Procedure Instructions (Signed)
11/04/11 Lossie Faes called back after receiving message regarding allergy to Tylenol.  Order given to Discontinue preop Tylenol order.  Nurse to discontinue preop tylenol order.

## 2011-11-06 ENCOUNTER — Encounter (HOSPITAL_COMMUNITY): Payer: Self-pay | Admitting: Anesthesiology

## 2011-11-06 ENCOUNTER — Inpatient Hospital Stay (HOSPITAL_COMMUNITY): Payer: Medicare Other

## 2011-11-06 ENCOUNTER — Inpatient Hospital Stay (HOSPITAL_COMMUNITY): Payer: Medicare Other | Admitting: Anesthesiology

## 2011-11-06 ENCOUNTER — Encounter (HOSPITAL_COMMUNITY)
Admission: RE | Disposition: A | Discharge status: Home / Self Care | Payer: Self-pay | Source: Ambulatory Visit | Attending: Urology

## 2011-11-06 ENCOUNTER — Encounter (HOSPITAL_COMMUNITY): Payer: Self-pay | Admitting: *Deleted

## 2011-11-06 ENCOUNTER — Inpatient Hospital Stay (HOSPITAL_COMMUNITY)
Admission: RE | Admit: 2011-11-06 | Discharge: 2011-11-11 | DRG: 707 | Disposition: A | Discharge status: Home / Self Care | Payer: Medicare Other | Source: Ambulatory Visit | Attending: Urology | Admitting: Urology

## 2011-11-06 DIAGNOSIS — D649 Anemia, unspecified: Secondary | ICD-10-CM

## 2011-11-06 DIAGNOSIS — Z7982 Long term (current) use of aspirin: Secondary | ICD-10-CM

## 2011-11-06 DIAGNOSIS — N400 Enlarged prostate without lower urinary tract symptoms: Secondary | ICD-10-CM | POA: Insufficient documentation

## 2011-11-06 DIAGNOSIS — E861 Hypovolemia: Secondary | ICD-10-CM

## 2011-11-06 DIAGNOSIS — R579 Shock, unspecified: Secondary | ICD-10-CM | POA: Diagnosis present

## 2011-11-06 DIAGNOSIS — M129 Arthropathy, unspecified: Secondary | ICD-10-CM | POA: Diagnosis present

## 2011-11-06 DIAGNOSIS — T8110XA Postprocedural shock unspecified, initial encounter: Secondary | ICD-10-CM | POA: Diagnosis not present

## 2011-11-06 DIAGNOSIS — N189 Chronic kidney disease, unspecified: Secondary | ICD-10-CM | POA: Diagnosis present

## 2011-11-06 DIAGNOSIS — I2510 Atherosclerotic heart disease of native coronary artery without angina pectoris: Secondary | ICD-10-CM | POA: Diagnosis present

## 2011-11-06 DIAGNOSIS — I25810 Atherosclerosis of coronary artery bypass graft(s) without angina pectoris: Secondary | ICD-10-CM | POA: Insufficient documentation

## 2011-11-06 DIAGNOSIS — Y836 Removal of other organ (partial) (total) as the cause of abnormal reaction of the patient, or of later complication, without mention of misadventure at the time of the procedure: Secondary | ICD-10-CM | POA: Diagnosis present

## 2011-11-06 DIAGNOSIS — I509 Heart failure, unspecified: Secondary | ICD-10-CM

## 2011-11-06 DIAGNOSIS — I252 Old myocardial infarction: Secondary | ICD-10-CM

## 2011-11-06 DIAGNOSIS — I4891 Unspecified atrial fibrillation: Secondary | ICD-10-CM | POA: Insufficient documentation

## 2011-11-06 DIAGNOSIS — Z950 Presence of cardiac pacemaker: Secondary | ICD-10-CM

## 2011-11-06 DIAGNOSIS — M109 Gout, unspecified: Secondary | ICD-10-CM | POA: Diagnosis not present

## 2011-11-06 DIAGNOSIS — R34 Anuria and oliguria: Secondary | ICD-10-CM

## 2011-11-06 DIAGNOSIS — R319 Hematuria, unspecified: Secondary | ICD-10-CM | POA: Diagnosis not present

## 2011-11-06 DIAGNOSIS — Z79899 Other long term (current) drug therapy: Secondary | ICD-10-CM

## 2011-11-06 DIAGNOSIS — Z9861 Coronary angioplasty status: Secondary | ICD-10-CM

## 2011-11-06 DIAGNOSIS — N401 Enlarged prostate with lower urinary tract symptoms: Principal | ICD-10-CM | POA: Diagnosis present

## 2011-11-06 DIAGNOSIS — N138 Other obstructive and reflux uropathy: Principal | ICD-10-CM | POA: Diagnosis present

## 2011-11-06 DIAGNOSIS — I5022 Chronic systolic (congestive) heart failure: Secondary | ICD-10-CM | POA: Diagnosis present

## 2011-11-06 DIAGNOSIS — E785 Hyperlipidemia, unspecified: Secondary | ICD-10-CM | POA: Diagnosis present

## 2011-11-06 HISTORY — PX: PROSTATECTOMY: SHX69

## 2011-11-06 LAB — POCT I-STAT EG7
Acid-base deficit: 1 mmol/L (ref 0.0–2.0)
Calcium, Ion: 1.19 mmol/L (ref 1.12–1.32)
HCT: 36 % — ABNORMAL LOW (ref 39.0–52.0)
Sodium: 137 mEq/L (ref 135–145)

## 2011-11-06 LAB — BASIC METABOLIC PANEL
CO2: 27 mEq/L (ref 19–32)
Chloride: 104 mEq/L (ref 96–112)
Creatinine, Ser: 0.9 mg/dL (ref 0.50–1.35)

## 2011-11-06 LAB — CREATININE, SERUM
GFR calc Af Amer: >90 mL/min (ref >90)
GFR calc non Af Amer: 83 mL/min — ABNORMAL LOW (ref >90)

## 2011-11-06 LAB — CBC
HCT: 34.5 % — ABNORMAL LOW (ref 39.0–52.0)
Hemoglobin: 11.9 g/dL — ABNORMAL LOW (ref 13.0–17.0)
Hemoglobin: 11.1 g/dL — ABNORMAL LOW (ref 13.0–17.0)
RBC: 3.57 MIL/uL — ABNORMAL LOW (ref 4.22–5.81)
WBC: 14.8 10*3/uL — ABNORMAL HIGH (ref 4.0–10.5)
WBC: 14.1 10*3/uL — ABNORMAL HIGH (ref 4.0–10.5)

## 2011-11-06 LAB — PROTIME-INR: INR: 0.98 (ref 0.00–1.49)

## 2011-11-06 LAB — CARDIAC PANEL(CRET KIN+CKTOT+MB+TROPI)
CK, MB: 4.8 ng/mL — ABNORMAL HIGH (ref 0.3–4.0)
Relative Index: 3.3 — ABNORMAL HIGH (ref 0.0–2.5)
Total CK: 146 U/L (ref 7–232)
Troponin I: <0.3 ng/mL (ref <0.30)

## 2011-11-06 LAB — LACTIC ACID, PLASMA: Lactic Acid, Venous: 1.1 mmol/L (ref 0.5–2.2)

## 2011-11-06 LAB — TYPE AND SCREEN: ABO/RH(D): A POS

## 2011-11-06 LAB — PRO B NATRIURETIC PEPTIDE: Pro B Natriuretic peptide (BNP): 228.4 pg/mL — ABNORMAL HIGH (ref 0–125)

## 2011-11-06 SURGERY — PROSTATECTOMY, SUPRAPUBIC APPROACH
Anesthesia: General | Wound class: Clean

## 2011-11-06 MED ORDER — SODIUM CHLORIDE 0.9 % IV SOLN
750.0000 mL | INTRAVENOUS | Status: DC | PRN
  Administered 2011-11-06: 500 mL via INTRAVENOUS

## 2011-11-06 MED ORDER — CEFAZOLIN SODIUM 1-5 GM-% IV SOLN
1.0000 g | Freq: Three times a day (TID) | INTRAVENOUS | Status: AC
  Administered 2011-11-06 – 2011-11-07 (×2): 1 g via INTRAVENOUS
  Filled 2011-11-06 (×2): qty 50

## 2011-11-06 MED ORDER — ATORVASTATIN CALCIUM 40 MG PO TABS
40.0000 mg | ORAL_TABLET | Freq: Every day | ORAL | Status: DC
  Filled 2011-11-06: qty 1

## 2011-11-06 MED ORDER — HYDROMORPHONE HCL PF 1 MG/ML IJ SOLN
0.5000 mg | INTRAMUSCULAR | Status: DC | PRN
  Administered 2011-11-06 – 2011-11-07 (×4): 0.5 mg via INTRAVENOUS
  Filled 2011-11-06 (×3): qty 1

## 2011-11-06 MED ORDER — PHENYLEPHRINE HCL 10 MG/ML IJ SOLN: 50.0000 ug/min | INTRAVENOUS | Status: DC

## 2011-11-06 MED ORDER — PROPOFOL 10 MG/ML IV BOLUS
INTRAVENOUS | Status: DC | PRN
  Administered 2011-11-06: 80 mg via INTRAVENOUS

## 2011-11-06 MED ORDER — KCL IN DEXTROSE-NACL 10-5-0.45 MEQ/L-%-% IV SOLN
INTRAVENOUS | Status: DC
  Administered 2011-11-06 – 2011-11-07 (×3): via INTRAVENOUS
  Filled 2011-11-06 (×7): qty 1000

## 2011-11-06 MED ORDER — LIDOCAINE HCL (CARDIAC) 20 MG/ML IV SOLN
INTRAVENOUS | Status: DC | PRN
  Administered 2011-11-06: 50 mg via INTRAVENOUS

## 2011-11-06 MED ORDER — ETOMIDATE 2 MG/ML IV SOLN
INTRAVENOUS | Status: DC | PRN
  Administered 2011-11-06: 6 mg via INTRAVENOUS

## 2011-11-06 MED ORDER — CEFAZOLIN SODIUM-DEXTROSE 2-3 GM-% IV SOLR
2.0000 g | Freq: Once | INTRAVENOUS | Status: AC
  Administered 2011-11-06: 2 g via INTRAVENOUS

## 2011-11-06 MED ORDER — REFRESH P.M. OP OINT: 1.0000 "application " | TOPICAL_OINTMENT | Freq: Every day | OPHTHALMIC | Status: DC

## 2011-11-06 MED ORDER — ACETAMINOPHEN 10 MG/ML IV SOLN
INTRAVENOUS | Status: DC | PRN
  Administered 2011-11-06: 1000 mg via INTRAVENOUS

## 2011-11-06 MED ORDER — INDIGOTINDISULFONATE SODIUM 8 MG/ML IJ SOLN
INTRAMUSCULAR | Status: DC | PRN
  Administered 2011-11-06: 40 mg via INTRAVENOUS

## 2011-11-06 MED ORDER — PROPYLENE GLYCOL 0.6 % OP SOLN: 1.0000 [drp] | OPHTHALMIC | Status: DC

## 2011-11-06 MED ORDER — SODIUM CHLORIDE 0.9 % IV BOLUS (SEPSIS)
500.0000 mL | Freq: Once | INTRAVENOUS | Status: AC
  Administered 2011-11-06: 500 mL via INTRAVENOUS

## 2011-11-06 MED ORDER — PHENYLEPHRINE HCL 10 MG/ML IJ SOLN
INTRAMUSCULAR | Status: DC | PRN
  Administered 2011-11-06 (×2): 40 ug via INTRAVENOUS

## 2011-11-06 MED ORDER — PHENYLEPHRINE HCL 10 MG/ML IJ SOLN
10.0000 mg | INTRAVENOUS | Status: DC | PRN
  Administered 2011-11-06: 40 ug/min via INTRAVENOUS

## 2011-11-06 MED ORDER — PHENYLEPHRINE HCL 10 MG/ML IJ SOLN
50.0000 ug/min | INTRAVENOUS | Status: DC
  Filled 2011-11-06: qty 4

## 2011-11-06 MED ORDER — STERILE WATER FOR IRRIGATION IR SOLN
Status: DC | PRN
  Administered 2011-11-06: 500 mL

## 2011-11-06 MED ORDER — GLYCOPYRROLATE 0.2 MG/ML IJ SOLN
INTRAMUSCULAR | Status: DC | PRN
  Administered 2011-11-06: .7 mg via INTRAVENOUS

## 2011-11-06 MED ORDER — HYDROCODONE-ACETAMINOPHEN 5-325 MG PO TABS
1.0000 | ORAL_TABLET | ORAL | Status: DC | PRN
  Administered 2011-11-07 – 2011-11-08 (×5): 1 via ORAL
  Administered 2011-11-09 (×3): 2 via ORAL
  Administered 2011-11-09: 1 via ORAL
  Administered 2011-11-10 – 2011-11-11 (×5): 2 via ORAL
  Filled 2011-11-06: qty 2
  Filled 2011-11-06: qty 1
  Filled 2011-11-06: qty 2
  Filled 2011-11-06 (×2): qty 1
  Filled 2011-11-06: qty 2
  Filled 2011-11-06: qty 1
  Filled 2011-11-06 (×3): qty 2
  Filled 2011-11-06 (×2): qty 1
  Filled 2011-11-06 (×2): qty 2

## 2011-11-06 MED ORDER — MIDAZOLAM HCL 5 MG/5ML IJ SOLN
INTRAMUSCULAR | Status: DC | PRN
  Administered 2011-11-06: 2 mg via INTRAVENOUS

## 2011-11-06 MED ORDER — SPIRONOLACTONE 25 MG PO TABS: 25.0000 mg | ORAL_TABLET | Freq: Every day | ORAL | Status: DC

## 2011-11-06 MED ORDER — ROCURONIUM BROMIDE 100 MG/10ML IV SOLN
INTRAVENOUS | Status: DC | PRN
  Administered 2011-11-06: 10 mg via INTRAVENOUS
  Administered 2011-11-06: 40 mg via INTRAVENOUS

## 2011-11-06 MED ORDER — ONDANSETRON HCL 4 MG/2ML IJ SOLN
INTRAMUSCULAR | Status: DC | PRN
  Administered 2011-11-06: 4 mg via INTRAVENOUS

## 2011-11-06 MED ORDER — HYDROMORPHONE HCL PF 1 MG/ML IJ SOLN
0.2500 mg | INTRAMUSCULAR | Status: DC | PRN
  Administered 2011-11-06: 0.5 mg via INTRAVENOUS

## 2011-11-06 MED ORDER — FENTANYL CITRATE 0.05 MG/ML IJ SOLN
INTRAMUSCULAR | Status: DC | PRN
  Administered 2011-11-06 (×3): 50 ug via INTRAVENOUS
  Administered 2011-11-06: 100 ug via INTRAVENOUS

## 2011-11-06 MED ORDER — HEPARIN SODIUM (PORCINE) 5000 UNIT/ML IJ SOLN
5000.0000 [IU] | Freq: Three times a day (TID) | INTRAMUSCULAR | Status: DC
  Administered 2011-11-06 – 2011-11-11 (×15): 5000 [IU] via SUBCUTANEOUS
  Filled 2011-11-06 (×18): qty 1

## 2011-11-06 MED ORDER — PROMETHAZINE HCL 25 MG/ML IJ SOLN: 6.2500 mg | INTRAMUSCULAR | Status: DC | PRN

## 2011-11-06 MED ORDER — BUPIVACAINE LIPOSOME 1.3 % IJ SUSP
20.0000 mL | Freq: Once | INTRAMUSCULAR | Status: AC
  Administered 2011-11-06: 50 mL
  Filled 2011-11-06: qty 20

## 2011-11-06 MED ORDER — LACTATED RINGERS IV SOLN: INTRAVENOUS | Status: DC

## 2011-11-06 MED ORDER — NEOSTIGMINE METHYLSULFATE 1 MG/ML IJ SOLN
INTRAMUSCULAR | Status: DC | PRN
  Administered 2011-11-06: 4 mg via INTRAVENOUS

## 2011-11-06 MED ORDER — POLYVINYL ALCOHOL 1.4 % OP SOLN
1.0000 [drp] | Freq: Every day | OPHTHALMIC | Status: DC
  Administered 2011-11-07 – 2011-11-10 (×4): 1 [drp] via OPHTHALMIC
  Filled 2011-11-06: qty 15

## 2011-11-06 MED ORDER — CARVEDILOL 12.5 MG PO TABS
12.5000 mg | ORAL_TABLET | Freq: Two times a day (BID) | ORAL | Status: DC
  Filled 2011-11-06: qty 1

## 2011-11-06 MED ORDER — BUPIVACAINE HCL (PF) 0.25 % IJ SOLN
INTRAMUSCULAR | Status: DC | PRN
  Administered 2011-11-06: 30 mL

## 2011-11-06 MED ORDER — SODIUM CHLORIDE 0.9 % IR SOLN
Status: DC | PRN
  Administered 2011-11-06: 2000 mL

## 2011-11-06 MED ORDER — LISINOPRIL 5 MG PO TABS
5.0000 mg | ORAL_TABLET | Freq: Every day | ORAL | Status: DC
  Filled 2011-11-06: qty 1

## 2011-11-06 MED ORDER — LACTATED RINGERS IV SOLN
INTRAVENOUS | Status: DC | PRN
  Administered 2011-11-06: 08:00:00 via INTRAVENOUS

## 2011-11-06 MED ORDER — ACETAMINOPHEN 10 MG/ML IV SOLN
1000.0000 mg | Freq: Four times a day (QID) | INTRAVENOUS | Status: AC
  Administered 2011-11-06 – 2011-11-07 (×4): 1000 mg via INTRAVENOUS
  Filled 2011-11-06 (×4): qty 100

## 2011-11-06 SURGICAL SUPPLY — 58 items
BAG URINE DRAINAGE (UROLOGICAL SUPPLIES) ×2 IMPLANT
BLADE EXTENDED COATED 6.5IN (ELECTRODE) ×2 IMPLANT
BLADE HEX COATED 2.75 (ELECTRODE) ×2 IMPLANT
BLADE SURG SZ12 CARB STEEL (BLADE) IMPLANT
CATH FOLEY 2WAY SLVR 30CC 22FR (CATHETERS) ×2 IMPLANT
CATH FOLEY 2WAY SLVR 30CC 24FR (CATHETERS) IMPLANT
CATH FOLEY 3WAY 30CC 24FR (CATHETERS)
CATH URET 5FR 28IN OPEN ENDED (CATHETERS) IMPLANT
CATH URTH STD 24FR FL 3W 2 (CATHETERS) IMPLANT
CLOTH BEACON ORANGE TIMEOUT ST (SAFETY) ×2 IMPLANT
CONT SPEC 4OZ CLIKSEAL STRL BL (MISCELLANEOUS) ×2 IMPLANT
COVER SURGICAL LIGHT HANDLE (MISCELLANEOUS) ×2 IMPLANT
DRAIN CHANNEL 10F 3/8 F FF (DRAIN) ×2 IMPLANT
DRAPE LAPAROSCOPIC ABDOMINAL (DRAPES) ×2 IMPLANT
DRAPE LAPAROTOMY T 102X78X121 (DRAPES) IMPLANT
DRAPE UTILITY 15X26 (DRAPE) ×2 IMPLANT
DRAPE WARM FLUID 44X44 (DRAPE) ×2 IMPLANT
ELECT REM PT RETURN 9FT ADLT (ELECTROSURGICAL) ×2
ELECTRODE REM PT RTRN 9FT ADLT (ELECTROSURGICAL) ×1 IMPLANT
EVACUATOR SILICONE 100CC (DRAIN) ×2 IMPLANT
FLOSEAL 10ML (HEMOSTASIS) ×2 IMPLANT
GAUZE SPONGE 4X4 16PLY XRAY LF (GAUZE/BANDAGES/DRESSINGS) ×2 IMPLANT
GLOVE BIOGEL M STRL SZ7.5 (GLOVE) ×2 IMPLANT
GOWN STRL REIN XL XLG (GOWN DISPOSABLE) ×2 IMPLANT
GUIDEWIRE STR DUAL SENSOR (WIRE) IMPLANT
KIT BASIN OR (CUSTOM PROCEDURE TRAY) ×2 IMPLANT
LABEL STERILE EO BLANK 1X3 WHT (LABEL) ×2 IMPLANT
LUBRICANT JELLY ST 5GR 8946 (MISCELLANEOUS) IMPLANT
NEEDLE HYPO 22GX1.5 SAFETY (NEEDLE) ×4 IMPLANT
NS IRRIG 1000ML POUR BTL (IV SOLUTION) ×4 IMPLANT
PACK GENERAL/GYN (CUSTOM PROCEDURE TRAY) ×2 IMPLANT
PLUG CATH AND CAP STER (CATHETERS) ×2 IMPLANT
SCRUB PCMX 4 OZ (MISCELLANEOUS) ×2 IMPLANT
SET IRRIG Y TYPE TUR BLADDER L (SET/KITS/TRAYS/PACK) IMPLANT
SPONGE LAP 18X18 X RAY DECT (DISPOSABLE) ×2 IMPLANT
SPONGE LAP 4X18 X RAY DECT (DISPOSABLE) ×2 IMPLANT
STAPLER SKIN PROX WIDE 3.9 (STAPLE) IMPLANT
STAPLER VISISTAT 35W (STAPLE) ×2 IMPLANT
SUT ETHILON 3 0 PS 1 (SUTURE) ×2 IMPLANT
SUT PDS AB 1 CTX 36 (SUTURE) ×4 IMPLANT
SUT SILK 0 (SUTURE)
SUT SILK 0 30XBRD TIE 6 (SUTURE) IMPLANT
SUT SILK 2 0 (SUTURE)
SUT SILK 2-0 30XBRD TIE 12 (SUTURE) IMPLANT
SUT VIC AB 0 UR5 27 (SUTURE) IMPLANT
SUT VIC AB 2-0 SH 27 (SUTURE) ×3
SUT VIC AB 2-0 SH 27X BRD (SUTURE) ×3 IMPLANT
SUT VIC AB 2-0 UR5 27 (SUTURE) ×6 IMPLANT
SUT VIC AB 2-0 UR6 27 (SUTURE) ×4 IMPLANT
SUT VIC AB 3-0 SH 27 (SUTURE) ×3
SUT VIC AB 3-0 SH 27XBRD (SUTURE) ×3 IMPLANT
SYR 20CC LL (SYRINGE) ×2 IMPLANT
SYR 30ML LL (SYRINGE) ×2 IMPLANT
SYR CONTROL 10ML LL (SYRINGE) ×2 IMPLANT
SYRINGE IRR TOOMEY STRL 70CC (SYRINGE) ×2 IMPLANT
TOWEL OR 17X26 10 PK STRL BLUE (TOWEL DISPOSABLE) ×4 IMPLANT
TOWEL OR NON WOVEN STRL DISP B (DISPOSABLE) ×2 IMPLANT
WATER STERILE IRR 1500ML POUR (IV SOLUTION) IMPLANT

## 2011-11-06 NOTE — Progress Notes (Signed)
Pharmacy: may adjust antibiotics for renal function.  Ancef x 2 doses post-op. Clearance appropriate. No adjustment necessary.  Thank you, Chilton Si, Leily Capek L 1:28 PM

## 2011-11-06 NOTE — Transfer of Care (Signed)
Immediate Anesthesia Transfer of Care Note  Patient: [name]  Procedure(s) Performed: Procedure(s) (LRB): PROSTATECTOMY SUPRAPUBIC (N/A)  Patient Location: PACU  Anesthesia Type: General  Level of Consciousness: sedated, patient cooperative and responds to stimulaton  Airway & Oxygen Therapy: Patient Spontanous Breathing and Patient connected to face mask oxgen  Post-op Assessment: Report given to PACU RN and Post -op Vital signs reviewed and stable  Post vital signs: Reviewed and stable  Complications: No apparent anesthesia complications

## 2011-11-06 NOTE — Anesthesia Postprocedure Evaluation (Signed)
  Anesthesia Post-op Note  Patient: Nazaiah L Venneman  Procedure(s) Performed: Procedure(s) (LRB): PROSTATECTOMY SUPRAPUBIC (N/A)  Patient Location: PACU  Anesthesia Type: General  Level of Consciousness: awake and alert   Airway and Oxygen Therapy: Patient Spontanous Breathing  Post-op Pain: mild  Post-op Assessment: Post-op Vital signs reviewed, Patient's Cardiovascular Status Stable, Respiratory Function Stable, Patent Airway and No signs of Nausea or vomiting  Post-op Vital Signs: slightly hypotensive, requiring low dose neo  Complications: No apparent anesthesia complications, to ICU overnight

## 2011-11-06 NOTE — Procedures (Signed)
Central Venous Catheter Insertion Procedure Note DEZMAN GRANDA [id number] Jan 11, 1941  Procedure: Insertion of Central Venous Catheter Indications: Assessment of intravascular volume, Drug and/or fluid administration and Frequent blood sampling  Procedure Details Consent: Risks of procedure as well as the alternatives and risks of each were explained to the (patient/caregiver).  Consent for procedure obtained. Time Out: Verified patient identification, verified procedure, site/side was marked, verified correct patient position, special equipment/implants available, medications/allergies/relevent history reviewed, required imaging and test results available.  Performed  Maximum sterile technique was used including antiseptics, cap, gloves, gown, hand hygiene, mask and sheet. Skin prep: Chlorhexidine; local anesthetic administered A antimicrobial bonded/coated double lumen catheter was placed in the right internal jugular vein using the Seldinger technique.  Evaluation Blood flow good Complications: No apparent complications Patient did tolerate procedure well. Chest X-ray ordered to verify placement.  CXR: pending.  BABCOCK,PETE 11/06/2011, 4:08 PM   Performed under my direct supervision  Sandrea Hughs, MD Pulmonary and Critical Care Medicine [facility] Cell [phone]

## 2011-11-06 NOTE — OR Nursing (Signed)
Neo gtt increased to 13.33 mcg/min

## 2011-11-06 NOTE — Op Note (Signed)
Pre-operative diagnosis :BPH  Postoperative diagnosis:Same  Operation:Suprapubic prostatecomy  Surgeon:  S. Patsi Sears, MD  First assistant:Amanda Myer Haff, PA-C  Anesthesia: GET  EBL: 800 cc  Preparation:After appropriate pre anesthesia, and pacemaker evaluation per anesthesia,  the pt. was brought to the operating room and placed upon the operating table in the supine position. Armband was checked again. SCDs were in  in place He then  underwent general endotracheal anesthesia, and then he was placed in the flex position, with care taken to protect his low back and his extremities. The abdomen was shaven, prepped, and draped in the usual fashion.   Review history: Recurrent urinary outlet obstruction with urinary frequency, urgency, hesitancy,  weak stream,and  Nocturia, despite maximal medical therapy with Avodart and Uroxatral. PUS shows prostate size to be 216cc, and cystoscopy shows severe trabeculation and cellule formation. Situation discussed with patient and family, and he was reccommended to have open suprapubic prostatectomy because of the overall size of his gland and the large intravesical component.   Statement of  Likelihood of Success: Excellent. TIME-OUT observed.:   Procedure: A 10 cm incision was made from the umbilicus to the pubis, and subcutaneous tissue was incised with the electrosurgical unit. The rectus fascia was incised in the midline, and the pelvic gutter was dissected bilaterally. Minimal bleeding was encountered. A 56F foley was placed, with 30cc in the balloon. Stay sutures of 2-0 Vicryl were placed in the bladder, and the bladder was incised longitudinally. The self-retaining retractor was placed, and foley deflated and clamped to itself. The prostate was identified, and was noted to be quide enlarged, with a substantial intravesical component, as had been seen cystoscopically. The ureteral orifices were identifies, pushed lateralward and higher in the bladder.  An area of incision was outlined with the bovie unit, and posterior bladder wall over the prostate was incised. Using long Metzenbaum sizzors, the surgical capsule was dissected, and, using finger dissection, the prostate gland was enucleated. Mild-moderate bleeding was encountered, but was reduced after the gland was extirpated. Floseal was inserted into the fossa posteriorly, and a pack and pressure were held in place for 5 minutes. The pack was removed and the posterior bladder wall was closed. The foley was re-inflated to 30cc, and placed to traction. Indigo carmine was given and seen from both orifices.    The bladder was then closed in 2 layers, using 3-0 Vicryl running suture for the mucosal layer, and 2-0 Vicryl for the muscular layer. The bladder was then irrigated, and the anastomosis was noted to be water-tight.    A JP drain, 10 Flat, fully fluted, was then place via a separate suprapubic incision, and sewn in place with a 3-0 Ethilon suture.    Marcaine, 0.25% and Expariel, 20cc mixed in 50cc normal saline were injected into the wound edges and subcutaneous tissues for post op analgesia. The rectus muscle was gently re-approximated with 3-0 Vicryl, and the fascia was closed with 0 PDS sutures. The skin was then closed with skin stapler.   The patient was replaced in supine position, awakened, and taken to recovery room in good condition.

## 2011-11-06 NOTE — H&P (Signed)
Urology Admission H&P  Chief Complaint: "difficulty voiding"  History of Present Illness: The patient is a 71 year old male, on chronic Avodart and hamulus and, but with continued significant lower urinary tract symptoms (LUTS), including urinary frequency, urgency, intermittency, we can flow, urinary straining, and nocturia x3. He denies dysuria, or gross hematuria. He has a history of a negative biopsy in 2008, and he has a prostate size of 216.39 g. Of note is his history of pacemaker, treated by Dr. Elita Boone her. He was considered for office microwave thorough therapy, and cleared by Dr. Elita Boone her, but this was rejected because of the gross size of his prostate. In addition, cystoscopy showed that the patient had a large intravesical median lobe.  Because the patient has such a large prostate, failing medications, with international prostate symptom score of 23 (normal equal 7), he is encountered to have open prostatectomy. Is felt that he was to large for a microwave prostate, even if cleared by cardiology with a pacemaker, and too large for TURP, with very high risk of urinary incontinence. Therefore, we will proceed with open prostatectomy.  Past Medical History  Diagnosis Date  . Dyslipidemia   . Gout   . BPH (benign prostatic hypertrophy)     SEVERE  . Coronary artery disease     STATUS POST ANTERIOR WALL MYOCARDIAL INFARCTION. HE IS STATUS POST PTCA AND STENTING OF HIS LAD. HIS INITIAL SET WAS COMPLICATED BY SUBACUTE THROMBOSIS RESULTING IN A LARGE ANTERIOR WALL MYOCARDIAL INFARCTIONAND SUDSEQUENT CHF.  He has been on coumadin since that time.  . Myocardial infarction   . Chronic kidney disease     BPH  . Arthritis     knees, back   . CHF (congestive heart failure)     EJECTION FRACTION 30-35%, biV ICD    Past Surgical History  Procedure Date  . Insert / replace / remove pacemaker     STATUS POST BIVENTRICULAR PACEMAKER/AICD  . Cardiac catheterization 2005  . Coronary angioplasty       5 stents   . Other surgical history     right ear surgery as a child   . Appendectomy   . Knee arthroscopy     Home Medications:  Prescriptions prior to admission  Medication Sig Dispense Refill  . Artificial Tear Ointment (ARTIFICIAL TEARS) ointment Place 1 application into both eyes at bedtime.      [name] aspirin 81 MG EC tablet Take 81 mg by mouth at bedtime.       [name] atorvastatin (LIPITOR) 40 MG tablet TAKE 1 TABLET DAILY AT BEDTIME  90 tablet  2  . carvedilol (COREG) 12.5 MG tablet Take 12.5 mg by mouth 2 (two) times daily with a meal.       . finasteride (PROSCAR) 5 MG tablet Take 5 mg by mouth daily.       [name] lisinopril (PRINIVIL,ZESTRIL) 5 MG tablet Take 5 mg by mouth at bedtime.       . Multiple Vitamin (MULTIVITAMIN) tablet Take 1 tablet by mouth daily.       . Probiotic Product (PROBIOTIC FORMULA PO) Take 1 capsule by mouth daily.      [name] Propylene Glycol (SYSTANE BALANCE) 0.6 % SOLN Place 1 drop into both eyes every morning.      . sodium chloride (OCEAN) 0.65 % nasal spray Place 1 spray into the nose daily as needed. Allergies       . spironolactone (ALDACTONE) 25 MG tablet Take 25 mg by mouth daily  before breakfast.      . Tamsulosin HCl (FLOMAX) 0.4 MG CAPS Take 0.4 mg by mouth daily.      . traMADol (ULTRAM) 50 MG tablet Take 50 mg by mouth daily as needed. Maximum dose= 8 tablets per day. Pain       . warfarin (COUMADIN) 5 MG tablet Take 2.5-5 mg by mouth daily. 5 mg on Wednesday and Friday; all other days is 2.5 mg       Allergies:  Allergies  Allergen Reactions  . Analgesic (Aspirin-Caffeine)     Bladder shut down  . Antihistamines, Diphenhydramine-Type Other (See Comments)    Shut down bladder   . Diphenhydramine Hcl Other (See Comments)    Shut down bladder   . Pseudoephedrine Other (See Comments)    Shut down bladder   . Tylenol (Acetaminophen) Other (See Comments)    Shut down bladder    History reviewed. No pertinent family history. Social History:   reports that he has never smoked. He has never used smokeless tobacco. He reports that he does not drink alcohol or use illicit drugs.  Review of Systems  Constitutional: Negative for fever, chills, weight loss, malaise/fatigue and diaphoresis.  HENT: Negative.   Eyes: Negative.   Respiratory: Negative.   Cardiovascular: Negative for chest pain, palpitations, orthopnea, claudication, leg swelling and PND.  Gastrointestinal: Negative.   Genitourinary: Positive for urgency and frequency. Negative for dysuria, hematuria and flank pain.  Musculoskeletal: Negative.   Skin: Negative.   Neurological: Negative.  Negative for weakness.  Endo/Heme/Allergies: Negative.   Psychiatric/Behavioral: Negative.     Physical Exam:  Vital signs in last 24 hours: Temp:  [97 F (36.1 C)] 97 F (36.1 C) (03/01 1914) Pulse Rate:  [83] 83  (03/01 0638) Resp:  [16] 16  (03/01 0638) BP: (129)/(79) 129/79 mmHg (03/01 0638) SpO2:  [97 %] 97 % (03/01 7829) Physical Exam  Constitutional: He appears well-developed.  HENT:  Head: Normocephalic.  Eyes: Pupils are equal, round, and reactive to light.  Neck: Normal range of motion.  Cardiovascular: Normal rate and regular rhythm.   Respiratory: Effort normal. No respiratory distress. He has no wheezes. He exhibits no tenderness.  GI: Soft. He exhibits no distension and no mass. There is no tenderness. There is no rebound and no guarding.  Genitourinary: Penis normal. Prostate is enlarged. No penile tenderness.     Musculoskeletal: Normal range of motion.  Lymphadenopathy:       Right: No inguinal adenopathy present.       Left: No inguinal adenopathy present.  Neurological: He is alert.  Skin: Skin is warm and dry.  Psychiatric: He has a normal mood and affect. His speech is normal and behavior is normal. Judgment and thought content normal. Cognition and memory are normal.    Laboratory Data:  Results for orders placed during the hospital encounter of  11/06/11 (from the past 24 hour(s))  PROTIME-INR     Status: Normal   Collection Time   11/06/11  7:06 AM      Component Value Range   Prothrombin Time 13.2  11.6 - 15.2 (seconds)   INR 0.98  0.00 - 1.49    Recent Results (from the past 240 hour(s))  SURGICAL PCR SCREEN     Status: Normal   Collection Time   10/29/11 10:20 AM      Component Value Range Status Comment   MRSA, PCR NEGATIVE  NEGATIVE  Final    Staphylococcus aureus NEGATIVE  NEGATIVE  Final    Creatinine: No results found for this basename: CREATININE:7 in the last 168 hours Baseline Creatinine:   Impression/Assessment:  BPH with 216cc gland, with large medial lobe component.   Plan:  For surgical intervention.   Jenita Rayfield I 11/06/2011, 8:20 AM

## 2011-11-06 NOTE — Progress Notes (Signed)
Day of Surgery Subjective: Patient reports post op pain with coughing, better post pain med.   Objective: Vital signs in last 24 hours: Temp:  [97 F (36.1 C)-97.9 F (36.6 C)] 97.5 F (36.4 C) (03/01 1600) Pulse Rate:  [58-86] 59  (03/01 1745) Resp:  [9-19] 11  (03/01 1745) BP: (77-129)/(48-79) 99/59 mmHg (03/01 1745) SpO2:  [97 %-100 %] 99 % (03/01 1745)  Intake/Output from previous day:   Intake/Output this shift: Total I/O In: 3809 [I.V.:3209; IV Piggyback:600] Out: 1490 [Urine:625; Drains:65; Blood:800]  Physical Exam:  General:alert, cooperative and no distress GI: not done, soft and tenderness: suprapubic Male genitalia: not done no bladder distension noted Urethral Meatus: normal Resp: clear to auscultation bilaterally Cardio: S1, S2 normal Extremities: extremities normal, atraumatic, no cyanosis or edema and no edema, redness or tenderness in the calves or thighs  Lab Results:  Basename 11/06/11 1610 11/06/11 1247 11/06/11 1040  HGB 11.1* 11.9* 12.2*  HCT 32.6* 34.5* 36.0*   BMET  Basename 11/06/11 1610 11/06/11 1247 11/06/11 1040  NA 134* -- 137  K 4.4 -- 4.8  CL 104 -- --  CO2 27 -- --  GLUCOSE 223* -- --  BUN 18 -- --  CREATININE 0.90 0.91 --  CALCIUM 8.0* -- --    Basename 11/06/11 0706  LABPT --  INR 0.98   No results found for this basename: LABURIN:1 in the last 72 hours Results for orders placed during the hospital encounter of 10/29/11  SURGICAL PCR SCREEN     Status: Normal   Collection Time   10/29/11 10:20 AM      Component Value Range Status Comment   MRSA, PCR NEGATIVE  NEGATIVE  Final    Staphylococcus aureus NEGATIVE  NEGATIVE  Final     Studies/Results: Dg Chest Port 1v Same Day  11/06/2011  *RADIOLOGY REPORT*  Clinical Data: Prostate carcinoma.  Central line placement.  PORTABLE CHEST - 1 VIEW SAME DAY  Comparison: 10/29/2011  Findings: A new right jugular center venous catheter seen with tip overlying the cavoatrial  junction.  No evidence of pneumothorax.  Both lungs remain clear.  Heart size is normal.  No evidence of pleural effusion.  AICD remains in place and coronary artery stent is again noted.  IMPRESSION: Right jugular center venous catheter tip overlies the cavoatrial junction.  No evidence of pneumothorax or other acute findings.  Original Report Authenticated By: Danae Orleans, M.D.    Assessment/Plan: Hematuria Continue foley due to strict I&O, patient in ICU, urinary output monitoring and post op suprapubic prostatectomy  Stable post op. He arrived in ICU stepdown with Neo drip for blood pressure support ( hx CHF, anesthesia running "dry" in order to avoid CHF exacerbation). Have asked ICU MD to evaluate and treat. Urine bloody, and labs stable tonight. Dr. Margarita Grizzle on call, and is aware of case. Pictures given to patient, and case discussed with pt and family.    LOS: 0 days   Nhyira Leano I 11/06/2011, 6:11 PM

## 2011-11-06 NOTE — Consult Note (Signed)
Name: [name] MRN: [id number] DOB: 03/27/1941    LOS: 0  [facility]/Critical Care- 1st hour  History of Present Illness:  70-yowm on ACEI and coreg  w/   BPH w/ frequent UTIs, CAD, Chronic systolic heart failure (EF 30-35%) w/ bi-ventricular PM.  Presented to [facility] on 3/1 and underwent  elective suprapubic Prostatectomy on same day. Returned to the ICU post-op hypotensive on Neosynephrine gtt. PCCM asked to assist with management of his care pm 3/1  Lines / Drains: RIJ CVL  3/1 >>  Cultures:   Antibiotics: Cefazolin (post-op) 3/1>>>  Tests / Events: Suprapubic prostatectomy 3/1: returned to the ICU hypotensive on Neosynephrine gtt.     Past Medical History  Diagnosis Date  . Dyslipidemia   . Gout   . BPH (benign prostatic hypertrophy)     SEVERE  . Coronary artery disease     STATUS POST ANTERIOR WALL MYOCARDIAL INFARCTION. HE IS STATUS POST PTCA AND STENTING OF HIS LAD. HIS INITIAL SET WAS COMPLICATED BY SUBACUTE THROMBOSIS RESULTING IN A LARGE ANTERIOR WALL MYOCARDIAL INFARCTIONAND SUDSEQUENT CHF.  He has been on coumadin since that time.  . Myocardial infarction   . Chronic kidney disease     BPH  . Arthritis     knees, back   . CHF (congestive heart failure)     EJECTION FRACTION 30-35%, biV ICD    Past Surgical History  Procedure Date  . Insert / replace / remove pacemaker     STATUS POST BIVENTRICULAR PACEMAKER/AICD  . Cardiac catheterization 2005  . Coronary angioplasty     5 stents   . Other surgical history     right ear surgery as a child   . Appendectomy   . Knee arthroscopy    Prior to Admission medications   Medication Sig Start Date End Date Taking? Authorizing Provider  Artificial Tear Ointment (ARTIFICIAL TEARS) ointment Place 1 application into both eyes at bedtime.   Yes Historical Provider, MD  atorvastatin (LIPITOR) 40 MG tablet TAKE 1 TABLET DAILY AT BEDTIME 07/31/11  Yes Peter [place], MD  carvedilol (COREG) 12.5 MG tablet Take  12.5 mg by mouth 2 (two) times daily with a meal.    Yes Historical Provider, MD  lisinopril (PRINIVIL,ZESTRIL) 5 MG tablet Take 5 mg by mouth at bedtime.    Yes Historical Provider, MD  Probiotic Product (PROBIOTIC FORMULA PO) Take 1 capsule by mouth daily.   Yes Historical Provider, MD  Propylene Glycol (SYSTANE BALANCE) 0.6 % SOLN Place 1 drop into both eyes every morning.   Yes Historical Provider, MD  sodium chloride (OCEAN) 0.65 % nasal spray Place 1 spray into the nose daily as needed. Allergies    Yes Historical Provider, MD  spironolactone (ALDACTONE) 25 MG tablet Take 25 mg by mouth daily before breakfast.   Yes Historical Provider, MD  warfarin (COUMADIN) 5 MG tablet Take 2.5-5 mg by mouth daily. 5 mg on Wednesday and Friday; all other days is 2.5 mg    Historical Provider, MD   Allergies Allergies  Allergen Reactions  . Analgesic (Aspirin-Caffeine)     Bladder shut down  . Antihistamines, Diphenhydramine-Type Other (See Comments)    Shut down bladder   . Diphenhydramine Hcl Other (See Comments)    Shut down bladder   . Pseudoephedrine Other (See Comments)    Shut down bladder   . Tylenol (Acetaminophen) Other (See Comments)    Shut down bladder    Family History History reviewed. No pertinent  family history.  Social History  reports that he has never smoked. He has never used smokeless tobacco. He reports that he does not drink alcohol or use illicit drugs.  Review Of Systems  11 points review of systems is negative with an exception of listed in HPI.  Vital Signs: BP 103/67  Pulse 64  Temp(Src) 97.5 F (36.4 C) (Oral)  Resp 10  SpO2 100% N/C 3 liters         . dextrose 5 % and 0.45 % NaCl with KCl 10 mEq/L 100 mL/hr at 11/06/11 1318  . phenylephrine (NEO-SYNEPHRINE) 10 mg/250 ml infusion (40 mcg/ml)    . DISCONTD: lactated ringers      Intake/Output Summary (Last 24 hours) at 11/06/11 1438 Last data filed at 11/06/11 1400  Gross per 24 hour  Intake    1550 ml  Output   1155 ml  Net    395 ml    Physical Examination: General:  Obese white male, currently in bed, post-op. He is sleepy, easily wakes up, c/o post-op expected pain.  Neuro:  No focal deficits HEENT: MM are dry, No JVD Cardiovascular: regular irregular Lungs:  Clear to auscultation  Abdomen:  Soft, lower abd is tender to palp. Lower abd dressing w/ some bloody staining and JP drain intact.  GU: bloody scant urinary output from foley Musculoskeletal: intact.    Skin:  No edema        Labs and Imaging:   Lab 11/06/11 1247 11/06/11 1040  NA -- 137  K -- 4.8  CL -- --  CO2 -- --  BUN -- --  CREATININE 0.91 --  GLUCOSE -- --    Lab 11/06/11 1247 11/06/11 1040  HGB 11.9* 12.2*  HCT 34.5* 36.0*  WBC 14.8* --  PLT 178 --    Assessment and Plan:  BPH (benign prostatic hypertrophy) refractory to medical therapy. Now s/p suprapubic prostatectomy on 3/1. Has expected post-op hematuria Plan: -per urology  Shock circulatory, unclear etiology: volume depletion? , cardiogenic? Residual anesthesia or hbp med effect/ Doubt sepsis.  Plan: -gentle fluid challenge -wean Neo for MAP >65 -check Cardiac enzymes, BNP , lactate and Procalcitonin   SYSTOLIC HEART FAILURE, CHRONIC (EF 30-35%), with h/o Coronary artery disease. Followed by Katherina Right in out-pt setting.  Plan: -holding antihypertensives for now given shock state. Specifically will hold Ace-I given risk for Acute renal injury in this setting.  -checking CEs as mentioned above, given that he is pressor dependant.    Atrial fibrillation: rate currently controlled.  Plan: -telemonitoring -hold meds that will contribute to hypotension for now -resume anticoagulation when ok from surgical stand-point   Anemia  Lab 11/06/11 1247 11/06/11 1040  HGB 11.9* 12.2*  plan: -monitor -transfuse for Hgb <7  Best practices / Disposition: -->ICU status / PCCM consulting -->full code -->Heparin for DVT Px   -->family updated at bedside  The patient is critically ill with multiple organ systems failure and requires high complexity decision making for assessment and support, frequent evaluation and titration of therapies, application of advanced monitoring technologies and extensive interpretation of multiple databases. Critical Care Time devoted to patient care services described in this note is 45 minutes.  BABCOCK,PETE 11/06/2011, 2:27 PM  Pt independently  seen and examined and available cxr's reviewed and I agree with above findings/ imp/ plan  Discussed in detail all the  indications, usual  risks and alternatives  relative to the benefits with patient who agrees to proceed with cvl placement  to monitor cvp.    Sandrea Hughs, MD Pulmonary and Critical Care Medicine [facility] Cell [phone]

## 2011-11-06 NOTE — OR Nursing (Addendum)
Pt on neo gtt from o.r. Upon arrival to pacu Placed on guardrails at 14.67 mcg (71ml/min) based on pt weight, blood pressure interval set q 1155 decreased gtt to 6.67 mcg (57ml/min)

## 2011-11-06 NOTE — Interval H&P Note (Signed)
History and Physical Interval Note:  11/06/2011 8:32 AM  [name]  has presented today for surgery, with the diagnosis of Benign Prostate Hypertrophy  The various methods of treatment have been discussed with the patient and family. After consideration of risks, benefits and other options for treatment, the patient has consented to  Procedure(s) (LRB): PROSTATECTOMY SUPRAPUBIC (N/A) as a surgical intervention .  The patients' history has been reviewed, patient examined, no change in status, stable for surgery.  I have reviewed the patients' chart and labs.  Questions were answered to the patient's satisfaction.     [name]

## 2011-11-06 NOTE — Anesthesia Preprocedure Evaluation (Addendum)
Anesthesia Evaluation  Patient identified by MRN, date of birth, ID band Patient awake    Reviewed: Allergy & Precautions, H&P , NPO status , Patient's Chart, lab work & pertinent test results  Airway Mallampati: III TM Distance: <3 FB Neck ROM: Limited    Dental No notable dental hx.    Pulmonary neg pulmonary ROS,  clear to auscultation  Pulmonary exam normal       Cardiovascular hypertension, + CAD, + Past MI and +CHF + dysrhythmias Atrial Fibrillation + Cardiac Defibrillator Regular Normal    Neuro/Psych Negative Neurological ROS  Negative Psych ROS   GI/Hepatic negative GI ROS, Neg liver ROS,   Endo/Other  Negative Endocrine ROS  Renal/GU negative Renal ROS  Genitourinary negative   Musculoskeletal negative musculoskeletal ROS (+)   Abdominal   Peds negative pediatric ROS (+)  Hematology negative hematology ROS (+)   Anesthesia Other Findings   Reproductive/Obstetrics negative OB ROS                         Anesthesia Physical Anesthesia Plan  ASA: III  Anesthesia Plan: General   Post-op Pain Management:    Induction: Intravenous  Airway Management Planned: Oral ETT and Video Laryngoscope Planned  Additional Equipment:   Intra-op Plan:   Post-operative Plan: Extubation in OR  Informed Consent: I have reviewed the patients History and Physical, chart, labs and discussed the procedure including the risks, benefits and alternatives for the proposed anesthesia with the patient or authorized representative who has indicated his/her understanding and acceptance.   Dental advisory given  Plan Discussed with: CRNA  Anesthesia Plan Comments:        Anesthesia Quick Evaluation

## 2011-11-07 DIAGNOSIS — E861 Hypovolemia: Secondary | ICD-10-CM

## 2011-11-07 DIAGNOSIS — I509 Heart failure, unspecified: Secondary | ICD-10-CM

## 2011-11-07 DIAGNOSIS — R34 Anuria and oliguria: Secondary | ICD-10-CM

## 2011-11-07 LAB — BASIC METABOLIC PANEL
Calcium: 8 mg/dL — ABNORMAL LOW (ref 8.4–10.5)
GFR calc Af Amer: >90 mL/min (ref >90)
GFR calc non Af Amer: 84 mL/min — ABNORMAL LOW (ref >90)
Glucose, Bld: 124 mg/dL — ABNORMAL HIGH (ref 70–99)
Potassium: 3.9 mEq/L (ref 3.5–5.1)
Sodium: 133 mEq/L — ABNORMAL LOW (ref 135–145)

## 2011-11-07 LAB — CBC
Hemoglobin: 10.2 g/dL — ABNORMAL LOW (ref 13.0–17.0)
Platelets: 130 10*3/uL — ABNORMAL LOW (ref 150–400)
RBC: 3.25 MIL/uL — ABNORMAL LOW (ref 4.22–5.81)
WBC: 8.7 10*3/uL (ref 4.0–10.5)

## 2011-11-07 LAB — CARDIAC PANEL(CRET KIN+CKTOT+MB+TROPI)
CK, MB: 3.9 ng/mL (ref 0.3–4.0)
Total CK: 138 U/L (ref 7–232)
Total CK: 166 U/L (ref 7–232)
Troponin I: <0.3 ng/mL (ref <0.30)

## 2011-11-07 LAB — PRO B NATRIURETIC PEPTIDE: Pro B Natriuretic peptide (BNP): 324.3 pg/mL — ABNORMAL HIGH (ref 0–125)

## 2011-11-07 LAB — PROCALCITONIN: Procalcitonin: 0.26 ng/mL

## 2011-11-07 MED ORDER — BISACODYL 10 MG RE SUPP
10.0000 mg | Freq: Two times a day (BID) | RECTAL | Status: DC
  Administered 2011-11-07 – 2011-11-10 (×3): 10 mg via RECTAL
  Filled 2011-11-07 (×3): qty 1

## 2011-11-07 MED ORDER — METHYLPREDNISOLONE SODIUM SUCC 125 MG IJ SOLR
80.0000 mg | Freq: Once | INTRAMUSCULAR | Status: AC
  Administered 2011-11-07: 80 mg via INTRAVENOUS
  Filled 2011-11-07: qty 2

## 2011-11-07 NOTE — Progress Notes (Signed)
Name: [name] MRN: [id number] DOB: 02-28-1941    LOS: 1  [place] [facility]/Critical Care- 1st hour  History of Present Illness:  70-yowm on ACEI and coreg  w/   BPH w/ frequent UTIs, CAD, Chronic systolic heart failure (EF 30-35%) w/ bi-ventricular PM.  Presented to [facility] on 3/1 and underwent  elective suprapubic Prostatectomy on same day. Returned to the ICU post-op hypotensive on Neosynephrine gtt. PCCM asked to assist with management of his care pm 3/1  Lines / Drains: RIJ CVL  3/1 >> 3/2  Cultures:   Antibiotics: Cefazolin (post-op) 3/1>>>  Tests / Events: Suprapubic prostatectomy 3/1: returned to the ICU hypotensive on Neosynephrine gtt.    SUBJ: C/O exquisite R foot pain. Very painful to light palpation. Has h/o gout  Vital Signs: BP 104/56  Pulse 64  Temp(Src) 97.5 F (36.4 C) (Axillary)  Resp 15  Ht 6' (1.829 m)  Wt 106 kg (233 lb 11 oz)  BMI 31.69 kg/m2  SpO2 96% N/C 3 liters  CVP:  [6 mmHg-14 mmHg] 6 mmHg CVP:  [6 mmHg-14 mmHg] 6 mmHg   Intake/Output Summary (Last 24 hours) at 11/07/11 1159 Last data filed at 11/07/11 0933  Gross per 24 hour  Intake 5551.5 ml  Output   2390 ml  Net 3161.5 ml    Physical Examination: General:  Obese white male, currently in bed, post-op. No distress. Cognition intact.  Neuro:  No focal deficits HEENT: MM are dry, No JVD Cardiovascular: regular irregular Lungs:  Clear to auscultation  Abdomen:  Soft, lower abd is tender to palp. Lower abd dressing w/ some bloody staining and JP drain intact.  GU: bloody scant urinary output from foley Musculoskeletal: intact.   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 Now s/p suprapubic prostatectomy on 3/1. Has expected post-op hematuria Plan: -per urology  Shock circulatory, unclear etiology: resolved Plan: -D/C CVL -D/C CVP monitoring -Decrease IVFs -Monitor   SYSTOLIC HEART FAILURE, CHRONIC (EF 30-35%), with h/o Coronary artery disease. Followed by Katherina Right in out-pt setting.  Plan: -holding antihypertensives for now given shock state. Specifically will hold Ace-I given risk for Acute renal injury in this setting.  -Markers negative. ProBNP minimally elevated   Atrial fibrillation: rate currently controlled.  Plan: -telemonitoring -hold meds that will contribute to hypotension for now -resume anticoagulation when ok from surgical stand-point   Anemia  Lab 11/07/11 0430 11/06/11 1610 11/06/11 1247  HGB 10.2* 11.1* 11.9*  plan: -monitor -transfuse for Hgb <7  Probable R foot Gout flare: Solumedrol X 1 dose and reassess 3/3  Billy Fischer 11/07/2011, 11:59 AM

## 2011-11-07 NOTE — Progress Notes (Signed)
Urology Progress Note  Subjective:     No acute urologic events overnight. No flatus or BM. Tolerating CLD. Pain controlled.  Up out of bed to chair.  Neosynephrine drip weaned off at 05:00 this morning and patient maintaining BP.  Objective:  Patient Vitals for the past 24 hrs:  BP Temp Temp src Pulse Resp SpO2 Height Weight  11/07/11 0800 - 97.5 F (36.4 C) Axillary - - - - -  11/07/11 0700 119/61 mmHg - - 65  18  98 % - -  11/07/11 0600 110/53 mmHg - - 59  13  99 % - -  11/07/11 0500 100/54 mmHg - - 60  13  100 % - -  11/07/11 0401 - 97.5 F (36.4 C) Oral - - - - 106 kg (233 lb 11 oz)  11/07/11 0400 105/63 mmHg - - 59  14  99 % - -  11/07/11 0300 112/54 mmHg - - 59  12  99 % - -  11/07/11 0200 104/57 mmHg - - 59  14  98 % - -  11/07/11 0100 100/55 mmHg - - 60  13  98 % - -  11/07/11 0000 115/61 mmHg 97.7 F (36.5 C) Oral 66  16  99 % - -  11/06/11 2300 115/49 mmHg - - 62  14  100 % - -  11/06/11 2200 96/44 mmHg - - 61  13  100 % - -  11/06/11 2130 90/42 mmHg - - 59  13  100 % - -  11/06/11 2100 105/64 mmHg - - 60  12  99 % - -  11/06/11 2000 103/58 mmHg 97.4 F (36.3 C) Oral 60  14  100 % 6' (1.829 m) 101.2 kg (223 lb 1.7 oz)  11/06/11 1900 109/59 mmHg - - 59  12  99 % - -  11/06/11 1845 108/64 mmHg - - 59  14  100 % - -  11/06/11 1830 109/55 mmHg - - 59  11  100 % - -  11/06/11 1815 115/59 mmHg - - 59  12  100 % - -  11/06/11 1800 104/52 mmHg - - 59  11  100 % - -  11/06/11 1745 99/59 mmHg - - 59  11  99 % - -  11/06/11 1730 101/62 mmHg - - 59  13  99 % - -  11/06/11 1715 105/61 mmHg - - 59  15  100 % - -  11/06/11 1700 92/54 mmHg - - 59  11  100 % - -  11/06/11 1645 103/62 mmHg - - 59  13  100 % - -  11/06/11 1630 104/64 mmHg - - 59  16  100 % - -  11/06/11 1615 104/61 mmHg - - 59  14  100 % - -  11/06/11 1600 98/59 mmHg 97.5 F (36.4 C) Oral 59  15  100 % - -  11/06/11 1545 107/52 mmHg - - 58  13  100 % - -  11/06/11 1530 101/54 mmHg - - 59  13  100 % - -  11/06/11  1515 102/58 mmHg - - 59  15  100 % - -  11/06/11 1500 95/61 mmHg - - 61  10  98 % - -  11/06/11 1445 91/59 mmHg - - 59  14  - - -  11/06/11 1430 97/59 mmHg - - 59  13  100 % - -  11/06/11 1415 86/56 mmHg - - 59  14  98 % - -  11/06/11 1400 91/56 mmHg - - 59  12  99 % - -  11/06/11 1345 91/48 mmHg - - 60  16  99 % - -  11/06/11 1330 - - - 60  11  98 % - -  11/06/11 1315 85/53 mmHg - - 60  12  97 % - -  11/06/11 1300 87/48 mmHg - - 60  14  97 % - -  11/06/11 1245 93/54 mmHg - - 60  9  98 % - -  11/06/11 1241 - 97.5 F (36.4 C) - 64  - 100 % - -  11/06/11 1225 - 97.5 F (36.4 C) - - - - - -  11/06/11 1224 - - - 60  10  99 % - -  11/06/11 1221 - - - 60  11  100 % - -  11/06/11 1218 - - - 66  13  99 % - -  11/06/11 1215 103/67 mmHg - - 75  19  99 % - -  11/06/11 1212 - - - 74  10  97 % - -  11/06/11 1209 - - - 86  17  100 % - -  11/06/11 1206 - - - 60  13  100 % - -  11/06/11 1203 84/63 mmHg - - 59  15  100 % - -  11/06/11 1200 102/79 mmHg - - 58  15  100 % - -  11/06/11 1157 107/76 mmHg - - 59  14  100 % - -  11/06/11 1154 102/67 mmHg - - 59  14  100 % - -  11/06/11 1151 119/50 mmHg - - 59  14  100 % - -  11/06/11 1148 98/68 mmHg - - 60  12  100 % - -  11/06/11 1145 100/67 mmHg - - 59  13  100 % - -  11/06/11 1142 102/61 mmHg - - 60  11  100 % - -  11/06/11 1139 96/56 mmHg - - 60  11  100 % - -  11/06/11 1136 77/58 mmHg - - 60  11  100 % - -  11/06/11 1133 100/62 mmHg - - 61  11  100 % - -  11/06/11 1130 95/63 mmHg - - 65  - 98 % - -  11/06/11 1128 - 97.9 F (36.6 C) - - 12  - - -    Physical Exam: General:  No acute distress, awake Cardiovascular:    [x]   S1/S2 present, RRR  []   Irregularly irregular Chest:  CTA-B Abdomen:               []  Soft, appropriately TTP  []  Soft, NTTP  [x]  Soft, appropriately TTP, incision(s) dressed; JP serosanguinous  Genitourinary: Foley in place Foley:  Draining amber colored urine.    I/O last 3 completed shifts: In: 6171.5 [P.O.:240;  I.V.:5081.5; IV Piggyback:850] Out: 3270 [Urine:2265; Drains:205; Blood:800]  Recent Labs  [facility] 11/07/11 0430 11/06/11 1610   HGB 10.2* 11.1*   WBC 8.7 14.1*   PLT 130* 162    Recent Labs  Basename 11/07/11 0430 11/06/11 1610   NA 133* 134*   K 3.9 4.4   CL 103 104   CO2 27 27   BUN 13 18   CREATININE 0.88 0.90   CALCIUM 8.0* 8.0*   GFRNONAA 84* 83*   GFRAA >90 >90     Recent Labs  [facility] 11/06/11 0706  INR 0.98   APTT --     No components found with this basename: ABG:2     Assessment: POD#1 SP prostatectomy. Post-op shock. Plan: -Shock: resolving. Appreciate CCM help. -Ambulate -Advance diet -Dulcolax suppository -H&H & BMP in am. -Remain in ICU today to keep an eye on BP; if ok will transfer out tomorrow.   Natalia Leatherwood, MD [phone]

## 2011-11-08 DIAGNOSIS — M109 Gout, unspecified: Secondary | ICD-10-CM

## 2011-11-08 LAB — BASIC METABOLIC PANEL
CO2: 26 mEq/L (ref 19–32)
Calcium: 8.7 mg/dL (ref 8.4–10.5)
Chloride: 102 mEq/L (ref 96–112)
Creatinine, Ser: 0.97 mg/dL (ref 0.50–1.35)
Glucose, Bld: 166 mg/dL — ABNORMAL HIGH (ref 70–99)

## 2011-11-08 LAB — HEMOGLOBIN AND HEMATOCRIT, BLOOD: HCT: 29 % — ABNORMAL LOW (ref 39.0–52.0)

## 2011-11-08 NOTE — Progress Notes (Signed)
Urology Progress Note  Subjective:     No acute urologic events overnight. Positive BM. Tolerating regular diet. Pain controlled. BP stable.  Objective:  Patient Vitals for the past 24 hrs:  BP Temp Temp src Pulse Resp SpO2 Weight  11/08/11 0600 91/46 mmHg - - 66  11  93 % -  11/08/11 0400 90/33 mmHg 97.7 F (36.5 C) Oral 65  12  93 % 106.1 kg (233 lb 14.5 oz)  11/08/11 0200 99/46 mmHg - - 76  16  92 % -  11/08/11 0000 106/57 mmHg 99.4 F (37.4 C) Axillary 82  15  93 % -  11/07/11 2200 113/65 mmHg - - 93  19  93 % -  11/07/11 2000 114/67 mmHg 98 F (36.7 C) Oral 108  19  94 % -  11/07/11 1800 115/68 mmHg - - 85  17  94 % -  11/07/11 1600 105/50 mmHg 97.3 F (36.3 C) Oral 72  17  95 % -  11/07/11 1500 120/67 mmHg - - 74  16  95 % -  11/07/11 1400 117/78 mmHg - - 74  18  96 % -  11/07/11 1200 108/58 mmHg 97.9 F (36.6 C) Oral 66  16  96 % -  11/07/11 1100 111/53 mmHg - - 67  15  97 % -  11/07/11 0900 104/56 mmHg - - 64  15  96 % -    Physical Exam: General:  No acute distress, awake Cardiovascular:    [x]   S1/S2 present, RRR  []   Irregularly irregular Chest:  CTA-B Abdomen:               []  Soft, appropriately TTP  []  Soft, NTTP  [x]  Soft, appropriately TTP, incision(s) C/D/I; JP serosanguinous  Genitourinary: Foley in place Foley:  Draining amber colored urine.    I/O last 3 completed shifts: In: 4222.5 [P.O.:1820; I.V.:2102.5; IV Piggyback:300] Out: 4817 [Urine:4600; Drains:217]  Recent Labs  [facility] 11/08/11 0330 11/07/11 0430 11/06/11 1610   HGB 10.0* 10.2* --   WBC -- 8.7 14.1*   PLT -- 130* 162    Recent Labs  Basename 11/08/11 0330 11/07/11 0430   NA 133* 133*   K 4.3 3.9   CL 102 103   CO2 26 27   BUN 17 13   CREATININE 0.97 0.88   CALCIUM 8.7 8.0*   GFRNONAA 81* 84*   GFRAA >90 >90     Recent Labs  Basename 11/06/11 0706   INR 0.98   APTT --     No components found with this basename: ABG:2     Assessment: POD#2 SP prostatectomy.  Post-op shock. Plan: -Shock: resolved. Appreciate CCM help. -Labs stable -Transfer to telemetry orders placed.   Natalia Leatherwood, MD [phone]

## 2011-11-08 NOTE — Progress Notes (Signed)
Name: [name] MRN: [id number] DOB: [birth year]    LOS: 2  [place] [facility]/Critical Care- 1st hour  History of Present Illness:  70-yowm on ACEI and coreg  w/   BPH w/ frequent UTIs, CAD, Chronic systolic heart failure (EF 30-35%) w/ bi-ventricular PM.  Presented to [facility] on 3/1 and underwent  elective suprapubic Prostatectomy on same day. Returned to the ICU post-op hypotensive on Neosynephrine gtt. PCCM asked to assist with management of his care pm 3/1  Lines / Drains: RIJ CVL  3/1 >> 3/2  Cultures:   Antibiotics: Cefazolin (post-op) 3/1>>>  Tests / Events: Suprapubic prostatectomy 3/1: returned to the ICU hypotensive on Neosynephrine gtt.    SUBJ: R foot pain resolved. No new complaints. No distress  Vital Signs: BP 144/69  Pulse 74  Temp(Src) 97.7 F (36.5 C) (Oral)  Resp 16  Ht 6' (1.829 m)  Wt 106.1 kg (233 lb 14.5 oz)  BMI 31.72 kg/m2  SpO2 94%   Intake/Output Summary (Last 24 hours) at 11/08/11 1751 Last data filed at 11/08/11 1556  Gross per 24 hour  Intake   1560 ml  Output   2000 ml  Net   -440 ml    Physical Exam: General: No distress. Cognition intact.  Neuro:  No focal deficits HEENT: No JVD Cardiovascular: S1S2 no M Lungs:  Clear to auscultation  Abdomen:  Soft, lower abd is tender to palp. Lower abd dressing w/ some bloody staining and JP drain intact.  GU: bloody urinary output from foley Musculoskeletal: No edema Skin: No lesions  Labs and Imaging:   Lab 11/08/11 0330 11/07/11 0430 11/06/11 1610  NA 133* 133* 134*  K 4.3 3.9 4.4  CL 102 103 104  CO2 26 27 27   BUN 17 13 18   CREATININE 0.97 0.88 0.90  GLUCOSE 166* 124* 223*    Lab 11/08/11 0330 11/07/11 0430 11/06/11 1610 11/06/11 1247  HGB 10.0* 10.2* 11.1* --  HCT 29.0* 29.5* 32.6* --  WBC -- 8.7 14.1* 14.8*  PLT -- 130* 162 178    Assessment and Plan:  BPH (benign prostatic hypertrophy) refractory to medical therapy. Now s/p suprapubic prostatectomy on 3/1. Has  expected post-op hematuria Plan: -per urology  Shock circulatory, unclear etiology: resolved Plan: No further eval or mgmt indicated   SYSTOLIC HEART FAILURE, CHRONIC (EF 30-35%), with h/o Coronary artery disease. Followed by Katherina Right in out-pt setting.  REC: Resume antihypertensives as BP permits  Atrial fibrillation: rate currently controlled.  Plan: -Cont telemonitoring -resume anticoagulation when ok from surgical stand-point   Anemia  Lab 11/08/11 0330 11/07/11 0430 11/06/11 1610  HGB 10.0* 10.2* 11.1*  plan: -monitor -transfuse for Hgb <7  Probable R foot Gout flare: Essentially resolved. No further Rx indicated  PCCM will sign off. Please call if we can be of further assistance  Billy Fischer 11/08/2011, 5:51 PM

## 2011-11-09 LAB — CREATININE, FLUID (PLEURAL, PERITONEAL, JP DRAINAGE): Creat, Fluid: 1 mg/dL

## 2011-11-09 NOTE — Clinical Documentation Improvement (Signed)
Anemia Blood Loss Clarification  THIS DOCUMENT IS NOT A PERMANENT PART OF THE MEDICAL RECORD  RESPOND TO THE THIS QUERY, FOLLOW THE INSTRUCTIONS BELOW:  1. If needed, update documentation for the patient's encounter via the notes activity.  2. Access this query again and click edit on the In [employer].  3. After updating, or not, click F2 to complete all highlighted (required) fields concerning your review. Select "additional documentation in the medical record" OR "no additional documentation provided".  4. Click Sign note button.  5. The deficiency will fall out of your In Basket *Please let us know if you are not able to complete this workflow by phone or e-mail (listed below).        11/09/11  Dear Dr. Patsi Sears, S/Associates  In an effort to better capture your patient's severity of illness, reflect appropriate length of stay and utilization of resources, a review of the patient medical record has revealed the following indicators.    Based on your clinical judgment, please clarify and document in a progress note and/or discharge summary the clinical condition associated with the following supporting information:  In responding to this query please exercise your independent judgment.  The fact that a query is asked, does not imply that any particular answer is desired or expected.  Pt admitted with BPH,  According to lab pt's H/H=10.0/29.0 in setting of post-op hematuria s/p Suprapubic prostatectomy  Please clarify based on abnormal lab whether or not H/H can be further specified as one of the diagnoses listed below and document in pn or d/c summary.    Possible Clinical Conditions?   " Expected Acute Blood Loss Anemia  " Acute Blood Loss Anemia  " Acute on chronic blood loss anemia  " Other Condition________________  " Cannot Clinically Determine  Risk Factors: (recent surgery, pre op anemia, EBL in OR)  Supporting Information:  Signs and Symptoms  BPH,  Suprapubic prostatectomy, Hematuria, Dyslipidemia, Post Op shock   Diagnostics: EBL 800 ml per Op note 11/06/11  Component HCT HGB  Latest Ref Rng 39.0 - 52.0 % 13.0 - 17.0 g/dL  09/12/1094 04.5 (L) 40.9 (L)  11/07/2011 29.5 (L) 10.2 (L)  11/08/2011 29.0 (L) 10.0 (L)   Treatments: Transfusion: IV fluids: dextrose 5 % and 0.45 % NaCl with KCl 10 mEq/L  Serial H&H monitoring  Reviewed:  no additional documentation provided  Thank You,  Enis Slipper  RN, BSN, CCDS Clinical Documentation Specialist [facility] Dept Pager: [phone] / E-mail: [email or web address]  Health Information Management [place]

## 2011-11-09 NOTE — Progress Notes (Signed)
Urology Progress Note  3 Days Post-Op   Subjective: post op open suprapubic prostatectomy. Urine still serosanguinous. Pain relieved woht po pain med. JP drainage continues.      No acute urologic events overnight. Ambulation: minimal Flatus:   yes  Bowel movement yes   Pain: controlled  Objective:  Blood pressure 113/48, pulse 60, temperature 98.5 F (36.9 C), temperature source Oral, resp. rate 15, height 6' (1.829 m), weight 106.1 kg (233 lb 14.5 oz), SpO2 96.00%.  Physical Exam:  General:  No acute distress, awake Abd: Soft +BS. Mild tenderness. Wound is clean. No redness. Typical post op tenderness.   Genitourinary:  JP in place Clear/yellow fluid. No blood. . Foley in place. No scrotal edema.  Foley: Serosanguinous urine.     I/O last 3 completed shifts: In: 1100 [P.O.:1080; I.V.:20] Out: 3120 [Urine:3060; Drains:60]  Recent Labs  [facility] 11/08/11 0330 11/07/11 0430 11/06/11 1610   HGB 10.0* 10.2* --   WBC -- 8.7 14.1*   PLT -- 130* 162    Recent Labs  Basename 11/08/11 0330 11/07/11 0430   NA 133* 133*   K 4.3 3.9   CL 102 103   CO2 26 27   BUN 17 13   CREATININE 0.97 0.88   CALCIUM 8.7 8.0*   GFRNONAA 81* 84*   GFRAA >90 >90     No results found for this basename: PT:2,INR:2,APTT:2 in the last 72 hours   No components found with this basename: ABG:2  Assessment/Plan: Stable post op. Hgb stable. Will send JP fluid off for Cr. Awaiting floor bed since Saturday.   Staples not removed. Catheter not removed. Continue any current medications. Wound care discussed. To floor today. Shower, walk, send JP for Cr.

## 2011-11-09 NOTE — Progress Notes (Signed)
71 yo male s/post op open suprapubic prostatectomy. Urine remains serosanguinous, JP drain 20 ml serosanguinous emptied on nights, 50ml out on day shift. Dr Patsi Sears called to update. States adeq pain control on po hydrocodone 1-2 tabs. Encouraged to notify RN if pain >3 on 1-10 scale. Family updated and supportive at bedside. Transferred to [facility], report called to [place], [place]

## 2011-11-10 LAB — CBC
HCT: 30.4 % — ABNORMAL LOW (ref 39.0–52.0)
Hemoglobin: 10.3 g/dL — ABNORMAL LOW (ref 13.0–17.0)
MCH: 30.7 pg (ref 26.0–34.0)
RBC: 3.35 MIL/uL — ABNORMAL LOW (ref 4.22–5.81)

## 2011-11-10 NOTE — Progress Notes (Signed)
Urology Progress Note  4 Days Post-Op   Subjective: Pt c/o incisional pain, headache, and feeling "weak". JP cr=1.0. Minimal output. Urien red this AM. Constipation Rx with suppository and diet ( bran and prune  Juice).     No acute urologic events overnight. Ambulation: yes   Flatus: yes   Bowel movement yes  Pain: worsening ( note 4 days post Expareil).   Objective:  Blood pressure 94/52, pulse 63, temperature 98 F (36.7 C), temperature source Oral, resp. rate 16, height 6' (1.829 m), weight 97.523 kg (215 lb), SpO2 93.00%.  Physical Exam: Abd tender. No erythema. JP minimal, clear. Urine red today.   General:  No acute distress, awake Resp: clear to auscultation bilaterally Extremities: extremities normal, atraumatic, no cyanosis or edema Genitourinary:  Foley in place. Foley:red urine.     I/O last 3 completed shifts: In: 610 [P.O.:610] Out: 4297 [Urine:4195; Drains:102]  Recent Labs  Basename 11/08/11 0330   HGB 10.0*   WBC --   PLT --    Recent Labs  Basename 11/08/11 0330   NA 133*   K 4.3   CL 102   CO2 26   BUN 17   CREATININE 0.97   CALCIUM 8.7   GFRNONAA 81*   GFRAA >90     No results found for this basename: PT:2,INR:2,APTT:2 in the last 72 hours   No components found with this basename: ABG:2  Assessment/Plan: Pt is stable. His BP is lower this am, along with bloody urine, and will reck his Hgb this AM. Path shows BPH only.  D/c planning for AM if stable. Pt is intolerant of discomfort.s   Staples not removed. Catheter not removed. Continue any current medications. Wound care discussed. Pt is to increase activities as tolerated.

## 2011-11-11 ENCOUNTER — Encounter (HOSPITAL_COMMUNITY): Payer: Self-pay | Admitting: Urology

## 2011-11-11 ENCOUNTER — Telehealth: Payer: Self-pay | Admitting: Cardiovascular Disease

## 2011-11-11 MED ORDER — TRIMETHOPRIM 100 MG PO TABS: 100.0000 mg | ORAL_TABLET | ORAL | Status: AC

## 2011-11-11 MED ORDER — TRAMADOL-ACETAMINOPHEN 37.5-325 MG PO TABS: 1.0000 | ORAL_TABLET | Freq: Four times a day (QID) | ORAL | Status: AC | PRN

## 2011-11-11 MED ORDER — OXYBUTYNIN CHLORIDE ER 5 MG PO TB24: 5.0000 mg | ORAL_TABLET | Freq: Every day | ORAL | Status: DC

## 2011-11-11 MED ORDER — PHENAZOPYRIDINE HCL 200 MG PO TABS: 200.0000 mg | ORAL_TABLET | Freq: Three times a day (TID) | ORAL | Status: AC | PRN

## 2011-11-11 MED ORDER — TRAMADOL-ACETAMINOPHEN 37.5-325 MG PO TABS
1.0000 | ORAL_TABLET | ORAL | Status: DC | PRN
  Filled 2011-11-11: qty 1

## 2011-11-11 NOTE — Discharge Instructions (Signed)
1.  Activity:  You are encouraged to ambulate frequently (about every hour during waking hours) to help prevent blood clots from forming in your legs or lungs.  However, you should not engage in any heavy lifting (> 10-15 lbs), strenuous activity, or straining. 2. Diet: You should advance your diet as instructed by your physician.  It will be normal to have some bloating, nausea, and abdominal discomfort intermittently. 3. Prescriptions:  You will be provided a prescription for pain medication to take as needed.  If your pain is not severe enough to require the prescription pain medication, you may take extra strength Tylenol instead which will have less side effects.  You should also take a prescribed stool softener to avoid straining with bowel movements as the prescription pain medication may constipate you. 4. Incisions: You may remove your dressing bandages 48 hours after surgery if not removed in the hospital.  You will either have some small staples or special tissue glue at each of the incision sites. Once the bandages are removed (if present), the incisions may stay open to air.  You may start showering (but not soaking or bathing in water) the 2nd day after surgery and the incisions simply need to be patted dry after the shower.  No additional care is needed. 5. What to call us about: You should call the office [phone]) if you develop fever > 101 or develop persistent vomiting.  You can resume aspirin and vitamins 7 days after surgery.Benign Prostatic Hyperplasia You have an enlarged prostate. This is common in elderly males. It is called BPH. This stands for benign prostate hyperplasia. The prostate gland is located in base of the bladder. When it grows, the prostate blocks the urethra. This is the tube which drains urine from the bladder.  SYMPTOMS Weak urine stream.  Dribbling.  Feeling like the bladder has not emptied completely.  Difficulty starting urination.  Getting up frequently  at night to urinate.  Urinating more frequently during the day.  Complete urinary blockage or severe pain with urination requires immediate attention. DIAGNOSIS  Your caregiver often has a good idea what is wrong by taking a history and doing a physical exam.  Special x-rays may be done.  TREATMENT  For mild problems, no treatment may be necessary.  If the problems are moderate, medications may provide relief. Some of these work by making the prostate gland smaller. The herb saw palmetto is commonly used.  If complete blockage occurs, a Foley catheter is usually left in place for a few days.  Surgery is often needed for more severe problems. TURP is the prostate surgery for BPH which is done through the urethra. TURP stands for transurethral resection of the prostate. It involves cutting away chips from the prostate. It is done by removing chips so that they can come out through the penis.  Techniques using heat, microwave and laser to remove the prostate blockage are also being used.  HOME CARE INSTRUCTIONS  Give yourself time when you urinate.  Stay away from alcohol.  Beverages containing caffeine such as coffee, tea and colas can make the problems worse.  Decongestants, antihistamines, and some prescription medicines can also make the problem worse.  Follow up with your caregiver for further treatment as recommended.  SEEK IMMEDIATE MEDICAL CARE IF:  You develop increased pain with urination or are unable to pass your water.  You develop severe abdominal pain, vomiting, a high fever, or fainting.  You develop back pain or blood in  your urine.  MAKE SURE YOU:  Understand these instructions.  Will watch your condition.  Will get help right away if you are not doing well or get worse.  Document Released: 08/24/2005 Document Revised: 08/13/2011 Document Reviewed: 04/29/2007 [facility] Patient Information 2012 [place], [place].Post transurethral resection of the prostate (TURP)  instructions  Your recent prostate surgery requires very special post hospital care. Despite the fact that no skin incisions were used the area around the prostate incision is quite raw and is covered with a scab to promote healing and prevent bleeding. Certain cautions are needed to assure that the scab is not disturbed of the next 2-3 weeks while the healing proceeds.  Because the raw surface in your prostate and the irritating effects of urine you may expect frequency of urination and/or urgency (a stronger desire to urinate) and perhaps even getting up at night more often. This will usually resolve or improve slowly over the healing period. You may see some blood in your urine over the first 6 weeks. Do not be alarmed, even if the urine was clear for a while. Get off your feet and drink lots of fluids until clearing occurs. If you start to pass clots or don't improve call us.  Diet:  You may return to your normal diet immediately. Because of the raw surface of your bladder, alcohol, spicy foods, foods high in acid and drinks with caffeine may cause irritation or frequency and should be used in moderation. To keep your urine flowing freely and avoid constipation, drink plenty of fluids during the day (8-10 glasses). Tip: Avoid cranberry juice because it is very acidic.  Activity:  Your physical activity doesn't need to be restricted. However, if you are very active, you may see some blood in the urine. We suggest that you reduce your activity under the circumstances until the bleeding has stopped.  Bowels:  It is important to keep your bowels regular during the postoperative period. Straining with bowel movements can cause bleeding. A bowel movement every other day is reasonable. Use a mild laxative if needed, such as milk of magnesia 2-3 tablespoons, or 2 Dulcolax tablets. Call if you continue to have problems. If you had been taking narcotics for pain, before, during or after your surgery, you  may be constipated. Take a laxative if necessary.  Medication:  You should resume your pre-surgery medications unless told not to. In addition you may be given an antibiotic to prevent or treat infection. Antibiotics are not always necessary. All medication should be taken as prescribed until the bottles are finished unless you are having an unusual reaction to one of the drugs.     Problems you should report to [place]:  a. Fever greater than 101F. b. Heavy bleeding, or clots (see notes above about blood in urine). c. Inability to urinate. d. Drug reactions (hives, rash, nausea, vomiting, diarrhea). e. Severe burning or pain with urination that is not improving.

## 2011-11-11 NOTE — Discharge Summary (Signed)
Physician Discharge Summary  Patient ID: [name] MRN: [id number] DOB/AGE: [birth year] 71 y.o.  Admit date: 11/06/2011 Discharge date: 11/11/2011  Admission Diagnoses: BPH  Discharge Diagnoses:  Active Problems:  Atrial fibrillation  SYSTOLIC HEART FAILURE, CHRONIC  Coronary artery disease  BPH (benign prostatic hypertrophy)  Shock circulatory  Anemia   Discharged Condition: Stable  Hospital Course: open prostatectomy. Post op hematuria, while on Heparin SQ. Hgb stable/  Consults: none}  Significant Diagnostic Studies:none  Treatments: Surgery  Discharge Exam: Blood pressure 134/78, pulse 65, temperature 98.4 F (36.9 C), temperature source Oral, resp. rate 18, height 6' (1.829 m), weight 97.523 kg (215 lb), SpO2 95.00%. General appearance: alert, cooperative and no distress Resp: clear to auscultation bilaterally GI: normal findings: bowel sounds normal and wound: staples in place. No erythema. Male genitalia: normal Incision/Wound:stable.   Disposition:   Discharge Orders    Future Appointments: Provider: Department: Dept Phone: Center:   11/25/2011 8:00 AM [facility] [phone] None   01/28/2012 9:00 AM Duke Salvia, MD [facility] [phone] [facility]     Future Orders Please Complete By Expires   Diet - low sodium heart healthy      Increase activity slowly      Discharge instructions      Comments:   Walk, but no physical activity. OK to restart Coumadin.  RTC per prior appointment. Will need Hgb checked and cystogram to ensure wound healing. OK to shower.   Driving Restrictions      Comments:   No driving until cleared post op   Lifting restrictions      Comments:   No heavy lifting ( 20 lbs)   Leave dressing on - Keep it clean, dry, and intact until clinic visit      Call MD for:  temperature >100.4      Call MD for:  persistant nausea and vomiting      Call MD for:  severe uncontrolled pain      Call MD for:  redness, tenderness, or signs of infection (pain, swelling, redness, odor or green/yellow discharge around incision site)      Call MD for:  difficulty breathing, headache or visual disturbances      Call MD for:  persistant dizziness or light-headedness      (HEART FAILURE PATIENTS) Call MD:  Anytime you have any of the following symptoms: 1) 3 pound weight gain in 24 hours or 5 pounds in 1 week 2) shortness of breath, with or without a dry hacking cough 3) swelling in the hands, feet or stomach 4) if you have to sleep on extra pillows at night in order to breathe.      Discontinue IV        Medication List  As of 11/11/2011  8:33 AM   STOP taking these medications         aspirin 81 MG EC tablet      finasteride 5 MG tablet      multivitamin tablet      Tamsulosin HCl 0.4 MG Caps      traMADol 50 MG tablet         TAKE these medications         artificial tears ointment   Place 1 application into both eyes at bedtime.      atorvastatin 40 MG tablet   Commonly known as: LIPITOR   TAKE 1 TABLET DAILY AT BEDTIME      carvedilol 12.5 MG  tablet   Commonly known as: COREG   Take 12.5 mg by mouth 2 (two) times daily with a meal.      lisinopril 5 MG tablet   Commonly known as: PRINIVIL,ZESTRIL   Take 5 mg by mouth at bedtime.      oxybutynin 5 MG 24 hr tablet   Commonly known as: DITROPAN-XL   Take 1 tablet (5 mg total) by mouth daily.      phenazopyridine 200 MG tablet   Commonly known as: PYRIDIUM   Take 1 tablet (200 mg total) by mouth 3 (three) times daily as needed for pain.      PROBIOTIC FORMULA PO   Take 1 capsule by mouth daily.      sodium chloride 0.65 % nasal spray   Commonly known as: OCEAN   Place 1 spray into the nose daily as needed. Allergies        spironolactone 25 MG tablet   Commonly known as: ALDACTONE   Take 25 mg by mouth daily before breakfast.      SYSTANE BALANCE 0.6 % Soln   Generic drug: Propylene Glycol   Place 1 drop  into both eyes every morning.      traMADol-acetaminophen 37.5-325 MG per tablet   Commonly known as: ULTRACET   Take 1 tablet by mouth every 6 (six) hours as needed for pain.      trimethoprim 100 MG tablet   Commonly known as: TRIMPEX   Take 1 tablet (100 mg total) by mouth 1 day or 1 dose.      warfarin 5 MG tablet   Commonly known as: COUMADIN   Take 2.5-5 mg by mouth daily. 5 mg on Wednesday and Friday; all other days is 2.5 mg           Follow-up Information    Follow up with [name], MD on 11/16/2011. (at 3:00)    Contact information:   [address] [place], [address] [facility] [place] [address] [phone]         Begin coumadin at home.  SignedJethro Bolus I 11/11/2011, 8:33 AM

## 2011-11-11 NOTE — Telephone Encounter (Signed)
See if the bleeding stops after 2-3 more days before he re-starts coumadin

## 2011-11-11 NOTE — Telephone Encounter (Addendum)
Wife concerned,[name] had prostate surgery/ still very bloody drainage in foley bag and is to start coumadin tonight, wants home health for coumadin checks till able to come in for checks, I called [facility] and they stated it would be over a 1.5 weeks before they could go out. I called [name] and they will be able to admit hopefully by friday and call pt/inr into coumadin clinic number [phone]/ pt is on oral ABX. Wife informed to follow dc paper work for coumadin dosage, she agreed to plan.

## 2011-11-11 NOTE — Progress Notes (Signed)
Urology Progress Note  5 Days Post-Op   Subjective: Post open suprapubic prostatectomy    No acute urologic events overnight. Ambulation:   yes Bowel movement  yes x 1 overnight  Pain: Stable. Wants to go home with Tramadol.  Objective:  Blood pressure 134/78, pulse 65, temperature 98.4 F (36.9 C), temperature source Oral, resp. rate 18, height 6' (1.829 m), weight 97.523 kg (215 lb), SpO2 95.00%.  Physical Exam:  General:  No acute distress, awake Resp: clear to auscultation bilaterally Extremities: extremities normal, atraumatic, no cyanosis or edema Abd: soft. + BS. Wound stable. No erythema.  Genitourinary: GU: no edema Foley: urine reddish, but lighter than yesterday.     I/O last 3 completed shifts: In: 1080 [P.O.:1080] Out: 4892 [Urine:4845; Drains:47]  Recent Labs  [facility] 11/10/11 0920   HGB 10.3*   WBC 8.7   PLT 176    No results found for this basename: NA:2,K:2,CL:2,CO2:2,BUN:2,CREATININE:2,CALCIUM:2,MAGNESIUM:2,GFRNONAA:2,GFRAA:2 in the last 72 hours   No results found for this basename: PT:2,INR:2,APTT:2 in the last 72 hours   No components found with this basename: ABG:2  Assessment/Plan:  Staples not removed. Catheter not removed. Wound care discussed. Pt is to increase activities as tolerated. No return to work until further notice. D/C today.

## 2011-11-11 NOTE — Telephone Encounter (Signed)
New msg Pt's wife wants to know about home health to come out and check his coumadin. He just had prostrate surgery.

## 2011-11-12 NOTE — Telephone Encounter (Signed)
Blood in foley bag is lighter in color, [facility] will get inr tomorrow. Coumadin was started last night.

## 2011-11-12 NOTE — Telephone Encounter (Signed)
DISCUSSED WITH DR Elease Hashimoto PT WILL CONTINUE COUMADIN/ INR WILL BE CHECKED BY [facility]

## 2011-11-13 ENCOUNTER — Telehealth: Payer: Self-pay | Admitting: Cardiovascular Disease

## 2011-11-13 ENCOUNTER — Ambulatory Visit: Payer: Self-pay | Admitting: Internal Medicine

## 2011-11-13 DIAGNOSIS — I4891 Unspecified atrial fibrillation: Secondary | ICD-10-CM

## 2011-11-13 DIAGNOSIS — Z7901 Long term (current) use of anticoagulants: Secondary | ICD-10-CM

## 2011-11-13 LAB — POCT INR: INR: 1

## 2011-11-13 MED ORDER — LISINOPRIL 5 MG PO TABS: 5.0000 mg | ORAL_TABLET | Freq: Every day | ORAL | Status: DC

## 2011-11-13 NOTE — Telephone Encounter (Signed)
Lisinopril refill needed, [facility] order [facility]

## 2011-11-13 NOTE — Telephone Encounter (Signed)
Fax Received. Refill Completed. [name] (R.M.A)   

## 2011-11-18 ENCOUNTER — Ambulatory Visit: Payer: Self-pay | Admitting: Cardiology

## 2011-11-18 DIAGNOSIS — Z7901 Long term (current) use of anticoagulants: Secondary | ICD-10-CM

## 2011-11-18 DIAGNOSIS — I4891 Unspecified atrial fibrillation: Secondary | ICD-10-CM

## 2011-11-19 ENCOUNTER — Other Ambulatory Visit: Payer: Self-pay | Admitting: *Deleted

## 2011-11-19 ENCOUNTER — Other Ambulatory Visit: Payer: Self-pay | Admitting: Cardiovascular Disease

## 2011-11-19 MED ORDER — ATORVASTATIN CALCIUM 40 MG PO TABS: 40.0000 mg | ORAL_TABLET | Freq: Every day | ORAL | Status: DC

## 2011-11-19 NOTE — Telephone Encounter (Signed)
New Msg: pt also wants to know if he can have samples of medication to last him until mail order RX comes in considering pt is now out of medication. Please return pt call to discuss further.

## 2011-11-19 NOTE — Telephone Encounter (Signed)
No samples available pt informed

## 2011-11-25 ENCOUNTER — Ambulatory Visit (INDEPENDENT_AMBULATORY_CARE_PROVIDER_SITE_OTHER): Payer: Medicare Other | Admitting: Pharmacist

## 2011-11-25 ENCOUNTER — Telehealth: Payer: Self-pay | Admitting: Cardiovascular Disease

## 2011-11-25 DIAGNOSIS — Z7901 Long term (current) use of anticoagulants: Secondary | ICD-10-CM

## 2011-11-25 DIAGNOSIS — I4891 Unspecified atrial fibrillation: Secondary | ICD-10-CM

## 2011-11-25 LAB — POCT INR: INR: 3.9

## 2011-11-25 NOTE — Telephone Encounter (Signed)
Phone call from PA at Dr. Rachael Darby office.  Pt has not been eating or drinking well.  Has been feeling weak and dizzy for several days. I suspect he is volume depleted.  Rec.  Hold Lisinopril for 1 week.  Decrease coreg by 1/2 for a week .  He should be able to titrate his meds back up once he is eating and drinking better.  We'll call him in a week to check on him.

## 2011-12-02 ENCOUNTER — Ambulatory Visit (INDEPENDENT_AMBULATORY_CARE_PROVIDER_SITE_OTHER): Payer: Self-pay | Admitting: Cardiology

## 2011-12-02 DIAGNOSIS — Z7901 Long term (current) use of anticoagulants: Secondary | ICD-10-CM

## 2011-12-02 DIAGNOSIS — R0989 Other specified symptoms and signs involving the circulatory and respiratory systems: Secondary | ICD-10-CM

## 2011-12-02 DIAGNOSIS — I4891 Unspecified atrial fibrillation: Secondary | ICD-10-CM

## 2011-12-02 LAB — POCT INR: INR: 2.6

## 2011-12-03 ENCOUNTER — Telehealth: Payer: Self-pay | Admitting: *Deleted

## 2011-12-03 NOTE — Telephone Encounter (Signed)
Feeling better, eating better, pt returning to regular med dose.

## 2011-12-06 ENCOUNTER — Other Ambulatory Visit: Payer: Self-pay | Admitting: Cardiovascular Disease

## 2011-12-07 ENCOUNTER — Telehealth: Payer: Self-pay | Admitting: Cardiovascular Disease

## 2011-12-07 NOTE — Telephone Encounter (Signed)
BP reading from visit today, right 104/58, left 98/58 pulse 98 , no voiding changes, flank pain while lying down, [facility] [phone] if need any more info

## 2011-12-07 NOTE — Telephone Encounter (Signed)
Spoke with home health nurse/ [facility]. Stated he didn't feel pt was getting enough fluids and felt his intake wasn't enough, healing process is slow has two open areas on his wound. Talked with pt, asked him to measure water, 6-8 8oz glasses and pour into a pitcher daily so he is getting enough. Discussed protein rich foods, legumes, peanut butter, meat and dairy. Asked to increase for better wound healing. Pt agreed to plan. He is feeling ok, states not dizzy currently but has at times. Told pt to call with further questions or concerns , pt agreed to plan.

## 2011-12-07 NOTE — Telephone Encounter (Signed)
Fax Received. Refill Completed. [name] (R.M.A)   

## 2011-12-10 ENCOUNTER — Ambulatory Visit: Payer: Self-pay | Admitting: Pharmacist

## 2011-12-10 DIAGNOSIS — I4891 Unspecified atrial fibrillation: Secondary | ICD-10-CM

## 2011-12-10 DIAGNOSIS — Z7901 Long term (current) use of anticoagulants: Secondary | ICD-10-CM

## 2011-12-10 LAB — POCT INR: INR: 3.5

## 2011-12-17 ENCOUNTER — Ambulatory Visit: Payer: Self-pay | Admitting: Internal Medicine

## 2011-12-17 DIAGNOSIS — I4891 Unspecified atrial fibrillation: Secondary | ICD-10-CM

## 2011-12-17 DIAGNOSIS — Z7901 Long term (current) use of anticoagulants: Secondary | ICD-10-CM

## 2011-12-24 ENCOUNTER — Ambulatory Visit: Payer: Self-pay | Admitting: Cardiovascular Disease

## 2011-12-24 DIAGNOSIS — Z7901 Long term (current) use of anticoagulants: Secondary | ICD-10-CM

## 2011-12-24 DIAGNOSIS — I4891 Unspecified atrial fibrillation: Secondary | ICD-10-CM

## 2012-01-06 ENCOUNTER — Ambulatory Visit (INDEPENDENT_AMBULATORY_CARE_PROVIDER_SITE_OTHER): Payer: Medicare Other | Admitting: Cardiovascular Disease

## 2012-01-06 ENCOUNTER — Encounter: Payer: Self-pay | Admitting: Cardiovascular Disease

## 2012-01-06 VITALS — BP 90/60 | HR 60 | Ht 72.0 in | Wt 217.8 lb

## 2012-01-06 DIAGNOSIS — I509 Heart failure, unspecified: Secondary | ICD-10-CM

## 2012-01-06 DIAGNOSIS — R5383 Other fatigue: Secondary | ICD-10-CM

## 2012-01-06 DIAGNOSIS — R5381 Other malaise: Secondary | ICD-10-CM

## 2012-01-06 LAB — BASIC METABOLIC PANEL
BUN: 16 mg/dL (ref 6–23)
Calcium: 9 mg/dL (ref 8.4–10.5)
Creatinine, Ser: 0.9 mg/dL (ref 0.4–1.5)
GFR: 90.43 mL/min (ref >60.00)
Glucose, Bld: 135 mg/dL — ABNORMAL HIGH (ref 70–99)
Sodium: 139 mEq/L (ref 135–145)

## 2012-01-06 LAB — CBC WITH DIFFERENTIAL/PLATELET
Basophils Absolute: 0 10*3/uL (ref 0.0–0.1)
Eosinophils Absolute: 0.2 10*3/uL (ref 0.0–0.7)
Hemoglobin: 13.4 g/dL (ref 13.0–17.0)
Lymphocytes Relative: 16.2 % (ref 12.0–46.0)
MCHC: 33.4 g/dL (ref 30.0–36.0)
Monocytes Relative: 6.6 % (ref 3.0–12.0)
Neutrophils Relative %: 74.4 % (ref 43.0–77.0)
Platelets: 240 10*3/uL (ref 150.0–400.0)
RDW: 14.4 % (ref 11.5–14.6)

## 2012-01-06 LAB — TSH: TSH: 1.79 u[IU]/mL (ref 0.35–5.50)

## 2012-01-06 NOTE — Assessment & Plan Note (Signed)
[name] is very weak following his prostate surgery.  His blood pressure is fairly low. We will hold his further like to for a month or so. I'll see him back in several months for a repeat office visit. We'll restart it when his blood pressures better.  I suspect that this hypotension is due to his surgery and I suspect that he'll feel better as he heals up from his surgery.

## 2012-01-06 NOTE — Progress Notes (Signed)
[name] Date of Birth  1941-07-18 [name] [address]     [address] [place], [place]  [address] [phone]  Fax  [phone]  Problem list: 1. Coronary artery disease-status post pedis anterior wall myocardial infarction 2. Congestive heart failure-EF of 30-35%. He has episodes of hypotension related to his CHF medications 3. Biventricular pacemaker/AICD 4. Dyslipidemia 5. BPH-status post recent prostate surgery   History of Present Illness:  [name] is a 71 year old gentleman with a history of coronary disease and previous anterior wall myocardial infarction.  He has a history of congestive heart failure. His ejection fraction is 30-35%.  He has been on coumadin since his large anterior MI and subsequent CHF.    He has a biventricular pacemaker/AICD. He also has a history of dyslipidemia. He has severe benign prostatic hypertrophy.  He denies any cardiac complaints today. He recently had prostate surgery.  His BP has been low since his prostate surgery.  He is very weak.                                                                                                                                                                                                        Current Outpatient Prescriptions on File Prior to Visit  Medication Sig Dispense Refill  . Artificial Tear Ointment (ARTIFICIAL TEARS) ointment Place 1 application into both eyes at bedtime.      [name] atorvastatin (LIPITOR) 40 MG tablet Take 1 tablet (40 mg total) by mouth daily.  90 tablet  2  . carvedilol (COREG) 12.5 MG tablet TAKE 1 TABLET TWICE A DAY  180 tablet  3  . lisinopril (PRINIVIL,ZESTRIL) 5 MG tablet Take 1 tablet (5 mg total) by mouth at bedtime.  90 tablet  3  . oxybutynin (DITROPAN XL) 5 MG 24 hr tablet Take 1 tablet (5 mg total) by mouth daily.  14 tablet  1  . Propylene Glycol (SYSTANE BALANCE) 0.6 % SOLN Place 1 drop into both eyes every morning.        . sodium chloride (OCEAN) 0.65 % nasal spray Place 1 spray into the nose daily as needed. Allergies       . warfarin (COUMADIN) 5 MG tablet Take 2.5-5 mg by mouth daily. 5 mg on Wednesday and Friday; all other days is 2.5 mg      . Probiotic Product (PROBIOTIC FORMULA PO) Take 1 capsule by mouth daily.      spirinolcatone 25 mg a day   Allergies  Allergen Reactions  . Analgesic (Aspirin-Caffeine)  Bladder shut down    Past Medical History  Diagnosis Date  . Dyslipidemia   . Gout   . BPH (benign prostatic hypertrophy)     SEVERE  . Coronary artery disease     STATUS POST ANTERIOR WALL MYOCARDIAL INFARCTION. HE IS STATUS POST PTCA AND STENTING OF HIS LAD. HIS INITIAL SET WAS COMPLICATED BY SUBACUTE THROMBOSIS RESULTING IN A LARGE ANTERIOR WALL MYOCARDIAL INFARCTIONAND SUDSEQUENT CHF.  He has been on coumadin since that time.  . Myocardial infarction   . Chronic kidney disease     BPH  . Arthritis     knees, back   . CHF (congestive heart failure)     EJECTION FRACTION 30-35%, biV ICD     Past Surgical History  Procedure Date  . Insert / replace / remove pacemaker     STATUS POST BIVENTRICULAR PACEMAKER/AICD  . Cardiac catheterization 2005  . Coronary angioplasty     5 stents   . Other surgical history     right ear surgery as a child   . Appendectomy   . Knee arthroscopy   . Prostatectomy 11/06/2011    Procedure: PROSTATECTOMY SUPRAPUBIC;  Surgeon: Kathi Ludwig, MD;  Location: [facility];  Service: Urology;  Laterality: N/A;  Open Suprapubic Prostatectomy    History  Smoking status  . Never Smoker   Smokeless tobacco  . Never Used    History  Alcohol Use No    occasional beer     No family history on file.  Reviw of Systems:  Reviewed in the HPI.  All other systems are negative.  Physical Exam: BP 80/55  Pulse 60  Ht 6' (1.829 m)  Wt 217 lb 12.8 oz (98.793 kg)  BMI 29.54 kg/m2 The patient is alert and oriented x 3.  The mood and affect are  normal.   Skin: warm and dry.  Color is normal.    HEENT:   the sclera are nonicteric.  The mucous membranes are moist.  The carotids are 2+ without bruits.  There is no thyromegaly.  There is no JVD.    Lungs: clear.  The chest wall is non tender.    Heart: regular rate with a normal S1 and S2.  There are no murmurs, gallops, or rubs. The PMI is not displaced.     Abdomen: good bowel sounds.  There is no guarding or rebound.  There is no hepatosplenomegaly or tenderness.  There are no masses.   Extremities:  no clubbing, cyanosis, or edema.  The legs are without rashes.  The distal pulses are intact.   Neuro:  Cranial nerves II - XII are intact.  Motor and sensory functions are intact.    The gait is normal.  ECG: AV sequential pacing.  Assessment / Plan:

## 2012-01-06 NOTE — Patient Instructions (Addendum)
Your physician recommends that you schedule a follow-up appointment in: 2 months  Your physician recommends that you return for lab work in: 2 months bmet   Your physician recommends that you return for lab work in: today, bmet cbc tsh   Your physician has recommended you make the following change in your medication:   HOLD SPIRONOLACTONE DUE TO LOW BLOOD PRESSURE

## 2012-01-07 ENCOUNTER — Ambulatory Visit: Payer: Self-pay | Admitting: Internal Medicine

## 2012-01-07 DIAGNOSIS — Z7901 Long term (current) use of anticoagulants: Secondary | ICD-10-CM

## 2012-01-07 DIAGNOSIS — I4891 Unspecified atrial fibrillation: Secondary | ICD-10-CM

## 2012-01-07 LAB — POCT INR: INR: 2.8

## 2012-01-28 ENCOUNTER — Encounter: Payer: Self-pay | Admitting: Internal Medicine

## 2012-01-28 ENCOUNTER — Ambulatory Visit (INDEPENDENT_AMBULATORY_CARE_PROVIDER_SITE_OTHER): Payer: Medicare Other | Admitting: Internal Medicine

## 2012-01-28 VITALS — BP 110/70 | HR 62 | Ht 72.0 in | Wt 218.4 lb

## 2012-01-28 DIAGNOSIS — I5022 Chronic systolic (congestive) heart failure: Secondary | ICD-10-CM

## 2012-01-28 DIAGNOSIS — I5020 Unspecified systolic (congestive) heart failure: Secondary | ICD-10-CM

## 2012-01-28 DIAGNOSIS — I509 Heart failure, unspecified: Secondary | ICD-10-CM

## 2012-01-28 DIAGNOSIS — Z95810 Presence of automatic (implantable) cardiac defibrillator: Secondary | ICD-10-CM | POA: Insufficient documentation

## 2012-01-28 DIAGNOSIS — I2510 Atherosclerotic heart disease of native coronary artery without angina pectoris: Secondary | ICD-10-CM

## 2012-01-28 DIAGNOSIS — I4891 Unspecified atrial fibrillation: Secondary | ICD-10-CM

## 2012-01-28 DIAGNOSIS — I495 Sick sinus syndrome: Secondary | ICD-10-CM

## 2012-01-28 LAB — ICD DEVICE OBSERVATION
AL IMPEDENCE ICD: 475 Ohm
BAMS-0001: 170 {beats}/min
BATTERY VOLTAGE: 3.04 V
LV LEAD IMPEDENCE ICD: 703 Ohm
LV LEAD THRESHOLD: 1 V
RV LEAD AMPLITUDE: 11.9 mv
RV LEAD IMPEDENCE ICD: 551 Ohm
TZAT-0001ATACH: 3
TZAT-0001FASTVT: 1
TZAT-0002ATACH: NEGATIVE
TZAT-0012ATACH: 150 ms
TZAT-0012FASTVT: 200 ms
TZAT-0012SLOWVT: 200 ms
TZAT-0018ATACH: NEGATIVE
TZAT-0018FASTVT: NEGATIVE
TZAT-0019ATACH: 6 V
TZAT-0019ATACH: 6 V
TZAT-0019SLOWVT: 8 V
TZAT-0020ATACH: 1.5 ms
TZAT-0020ATACH: 1.5 ms
TZAT-0020ATACH: 1.5 ms
TZAT-0020FASTVT: 1.5 ms
TZAT-0020SLOWVT: 1.5 ms
TZON-0003SLOWVT: 360 ms
TZON-0003VSLOWVT: 370 ms
TZON-0004SLOWVT: 16
TZST-0001ATACH: 5
TZST-0001ATACH: 6
TZST-0001FASTVT: 2
TZST-0001FASTVT: 4
TZST-0001FASTVT: 6
TZST-0001SLOWVT: 2
TZST-0001SLOWVT: 5
TZST-0001SLOWVT: 6
TZST-0002ATACH: NEGATIVE
TZST-0002FASTVT: NEGATIVE
TZST-0002FASTVT: NEGATIVE
TZST-0002FASTVT: NEGATIVE
TZST-0002SLOWVT: NEGATIVE
TZST-0002SLOWVT: NEGATIVE
VENTRICULAR PACING ICD: 99.8 pct

## 2012-01-28 NOTE — Assessment & Plan Note (Signed)
The patient's device was interrogated.  The information was reviewed. No changes were made in the programming.    

## 2012-01-28 NOTE — Progress Notes (Signed)
  HPI  [name] is a 71 y.o. male seen in followup for  CRT upgrade for congestive heart failure in the setting of ischemic heart disease. He was status post ICD implantation initially as part of a Master protocol.  Initially  following ICD-CRT upgrade he did better The patient denies chest pain, edema or palpitations; t  He has intercurrently undergone prostate surgery. Normal weight is 10 g. His was 210 g. He is recovering slowly.     Past Medical History  Diagnosis Date  . Dyslipidemia   . Gout   . BPH (benign prostatic hypertrophy)     SEVERE  . Coronary artery disease     STATUS POST ANTERIOR WALL MYOCARDIAL INFARCTION. HE IS STATUS POST PTCA AND STENTING OF HIS LAD. HIS INITIAL SET WAS COMPLICATED BY SUBACUTE THROMBOSIS RESULTING IN A LARGE ANTERIOR WALL MYOCARDIAL INFARCTIONAND SUDSEQUENT CHF.  He has been on coumadin since that time.  . Myocardial infarction   . Chronic kidney disease     BPH  . Arthritis     knees, back   . CHF (congestive heart failure)     EJECTION FRACTION 30-35%, biV ICD     Past Surgical History  Procedure Date  . Insert / replace / remove pacemaker     STATUS POST BIVENTRICULAR PACEMAKER/AICD  . Cardiac catheterization 2005  . Coronary angioplasty     5 stents   . Other surgical history     right ear surgery as a child   . Appendectomy   . Knee arthroscopy   . Prostatectomy 11/06/2011    Procedure: PROSTATECTOMY SUPRAPUBIC;  Surgeon: Kathi Ludwig, MD;  Location: [facility];  Service: Urology;  Laterality: N/A;  Open Suprapubic Prostatectomy    Current Outpatient Prescriptions  Medication Sig Dispense Refill  . atorvastatin (LIPITOR) 40 MG tablet Take 1 tablet (40 mg total) by mouth daily.  90 tablet  2  . carvedilol (COREG) 12.5 MG tablet TAKE 1 TABLET TWICE A DAY  180 tablet  3  . lisinopril (PRINIVIL,ZESTRIL) 5 MG tablet Take 1 tablet (5 mg total) by mouth at bedtime.  90 tablet  3  . oxybutynin (DITROPAN XL) 5 MG 24 hr tablet  Take 1 tablet (5 mg total) by mouth daily.  14 tablet  1  . Propylene Glycol (SYSTANE BALANCE) 0.6 % SOLN Place 1 drop into both eyes every morning.      . warfarin (COUMADIN) 5 MG tablet Take 2.5-5 mg by mouth daily. 5 mg on Wednesday and Friday; all other days is 2.5 mg        Allergies  Allergen Reactions  . Analgesic (Aspirin-Caffeine)     Bladder shut down    Review of Systems negative except from HPI and PMH  Physical Exam BP 110/70  Pulse 62  Ht 6' (1.829 m)  Wt 218 lb 6.4 oz (99.066 kg)  BMI 29.62 kg/m2 Well developed and well nourished in no acute distress HENT normal E scleral and icterus clear Neck Supple JVP flat; carotids brisk and full Clear to ausculation Regular rate and rhythm, no murmurs gallops or rub Soft with active bowel sounds; healing   midline scar No clubbing cyanosis none Edema Alert and oriented, grossly normal motor and sensory function Skin Warm and Dry    Assessment and  Plan

## 2012-01-28 NOTE — Patient Instructions (Addendum)
Remote monitoring is used to monitor your Pacemaker of ICD from home. This monitoring reduces the number of office visits required to check your device to one time per year. It allows [place] to keep an eye on the functioning of your device to ensure it is working properly. You are scheduled for a device check from home on May 05, 2012. You may send your transmission at any time that day. If you have a wireless device, the transmission will be sent automatically. After your physician reviews your transmission, you will receive a postcard with your next transmission date.  Your physician wants you to follow-up in: 1 year with Dr Graciela Husbands.  You will receive a reminder letter in the mail two months in advance. If you don't receive a letter, please call our office to schedule the follow-up appointment.  Your physician has recommended you make the following change in your medication:  1) Restart aldactone (spironolactone) 25 mg 1/2 tablet by mouth once daily.  Your physician recommends that you return for lab work in: 2 weeks- bmp.

## 2012-01-28 NOTE — Assessment & Plan Note (Signed)
Stable on current meds 

## 2012-01-28 NOTE — Assessment & Plan Note (Signed)
He is mildly fluid overloaded his weight is up about 10 pounds. He notes abdominal swelling and trace edema. We will resume his Aldactone as his blood pressures around 110-130 range.  We'll start a 12.5 and measures metabolic profile in 2 weeks there

## 2012-01-28 NOTE — Assessment & Plan Note (Signed)
Atrially paced about 75% of the time with reasonable heart rate excursion

## 2012-01-28 NOTE — Assessment & Plan Note (Signed)
No significant atrial fibrillation is noted. He is on warfarin

## 2012-02-11 ENCOUNTER — Other Ambulatory Visit (INDEPENDENT_AMBULATORY_CARE_PROVIDER_SITE_OTHER): Payer: Medicare Other

## 2012-02-11 ENCOUNTER — Ambulatory Visit (INDEPENDENT_AMBULATORY_CARE_PROVIDER_SITE_OTHER): Payer: Medicare Other | Admitting: *Deleted

## 2012-02-11 DIAGNOSIS — Z7901 Long term (current) use of anticoagulants: Secondary | ICD-10-CM

## 2012-02-11 DIAGNOSIS — I509 Heart failure, unspecified: Secondary | ICD-10-CM

## 2012-02-11 DIAGNOSIS — I4891 Unspecified atrial fibrillation: Secondary | ICD-10-CM

## 2012-02-11 DIAGNOSIS — I5022 Chronic systolic (congestive) heart failure: Secondary | ICD-10-CM

## 2012-02-11 LAB — BASIC METABOLIC PANEL
CO2: 26 mEq/L (ref 19–32)
Calcium: 9 mg/dL (ref 8.4–10.5)
Creatinine, Ser: 0.9 mg/dL (ref 0.4–1.5)
GFR: 90.4 mL/min (ref >60.00)
Glucose, Bld: 106 mg/dL — ABNORMAL HIGH (ref 70–99)

## 2012-02-11 LAB — POCT INR: INR: 2.6

## 2012-03-04 ENCOUNTER — Ambulatory Visit (INDEPENDENT_AMBULATORY_CARE_PROVIDER_SITE_OTHER): Payer: Medicare Other | Admitting: *Deleted

## 2012-03-04 ENCOUNTER — Encounter: Payer: Self-pay | Admitting: Cardiovascular Disease

## 2012-03-04 ENCOUNTER — Ambulatory Visit (INDEPENDENT_AMBULATORY_CARE_PROVIDER_SITE_OTHER): Payer: Medicare Other | Admitting: Cardiovascular Disease

## 2012-03-04 VITALS — BP 109/73 | HR 64 | Ht 72.0 in | Wt 215.0 lb

## 2012-03-04 DIAGNOSIS — I5022 Chronic systolic (congestive) heart failure: Secondary | ICD-10-CM

## 2012-03-04 DIAGNOSIS — I4891 Unspecified atrial fibrillation: Secondary | ICD-10-CM

## 2012-03-04 DIAGNOSIS — Z7901 Long term (current) use of anticoagulants: Secondary | ICD-10-CM

## 2012-03-04 NOTE — Patient Instructions (Signed)
Your physician wants you to follow-up in: 6 month ov You will receive a reminder letter in the mail two months in advance. If you don't receive a letter, please call our office to schedule the follow-up appointment.

## 2012-03-04 NOTE — Assessment & Plan Note (Addendum)
[name] seems to be doing fairly well. His blood pressures up little bit more than it was last visit and I don't think that we can increase his lisinopril this point. We gave had a difficult time and titrating up his carvedilol and lisinopril because of hypertension.  He has been fairly weak and dizzy since his prostate surgery. He seems to be recovering nicely from that.  We'll continue with her same medications. I've encouraged him to exercise on a regular basis and watch his salt intake. I'll see him again in 6 months for office visit, fasting lipids, basic metabolic profile, and hepatic profile and EKG.

## 2012-03-04 NOTE — Progress Notes (Signed)
[name] Date of Birth  1941/05/30 [facility] [address]     [address] [place], [place]  [address] [phone]  Fax  [phone]  Problem list: 1. Coronary artery disease-status post pedis anterior wall myocardial infarction 2. Congestive heart failure-EF of 30-35%. He has episodes of hypotension related to his CHF medications 3. Biventricular pacemaker/AICD 4. Dyslipidemia 5. BPH-status post recent prostate surgery   History of Present Illness:  [name] is a 71 year old gentleman with a history of coronary disease and previous anterior wall myocardial infarction.  He has a history of congestive heart failure. His ejection fraction is 30-35%.  He has been on coumadin since his large anterior MI and subsequent CHF.    He has a biventricular pacemaker/AICD. He also has a history of dyslipidemia. He has severe benign prostatic hypertrophy.  He denies any cardiac complaints today. He recently had prostate surgery.  His BP has been low since his prostate surgery.  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 atorvastatin (LIPITOR) 40 MG tablet Take 1 tablet (40 mg total) by mouth daily.  90 tablet  2  . carvedilol (COREG) 12.5 MG tablet TAKE 1 TABLET TWICE A DAY  180 tablet  3  . lisinopril (PRINIVIL,ZESTRIL) 5 MG tablet Take 1 tablet (5 mg total) by mouth at bedtime.  90 tablet  3  . oxybutynin (DITROPAN XL) 5 MG 24 hr tablet Take 1 tablet (5 mg total) by mouth daily.  14 tablet  1  . Propylene Glycol (SYSTANE BALANCE) 0.6 % SOLN Place 1 drop into both eyes every morning.      [name] spironolactone (ALDACTONE) 25 MG tablet Take 1/2  tablet by mouth once daily      . warfarin (COUMADIN) 5 MG tablet Take 2.5-5 mg by mouth daily. 5 mg on Wednesday and Friday; all other days is 2.5 mg      spirinolcatone 25 mg a day   Allergies  Allergen Reactions  . Analgesic (Aspirin-Caffeine)     Bladder shut down    Past Medical History  Diagnosis Date  . Dyslipidemia   . Gout   . BPH (benign prostatic hypertrophy)     SEVERE  .  Coronary artery disease     STATUS POST ANTERIOR WALL MYOCARDIAL INFARCTION. HE IS STATUS POST PTCA AND STENTING OF HIS LAD. HIS INITIAL SET WAS COMPLICATED BY SUBACUTE THROMBOSIS RESULTING IN A LARGE ANTERIOR WALL MYOCARDIAL INFARCTIONAND SUDSEQUENT CHF.  He has been on coumadin since that time.  . Myocardial infarction   . Chronic kidney disease     BPH  . Arthritis     knees, back   . CHF (congestive heart failure)     EJECTION FRACTION 30-35%, biV ICD     Past Surgical History  Procedure Date  . Insert / replace / remove pacemaker     STATUS POST BIVENTRICULAR PACEMAKER/AICD  . Cardiac catheterization 2005  . Coronary angioplasty     5 stents   . Other surgical history     right ear surgery as a child   . Appendectomy   . Knee arthroscopy   . Prostatectomy 11/06/2011    Procedure: PROSTATECTOMY SUPRAPUBIC;  Surgeon: Kathi Ludwig, MD;  Location: [facility];  Service: Urology;  Laterality: N/A;  Open Suprapubic Prostatectomy    History  Smoking status  . Never Smoker   Smokeless tobacco  . Never Used    History  Alcohol Use No    occasional beer     No family history on file.  Reviw of Systems:  Reviewed in the HPI.  All other systems are negative.  Physical Exam: BP 109/73  Pulse 64  Ht 6' (1.829 m)  Wt 215 lb (97.523 kg)  BMI 29.16 kg/m2 The patient is alert and oriented x 3.  The mood and affect are normal.   Skin: warm and dry.  Color is normal.    HEENT:   the sclera are nonicteric.  The mucous membranes are moist.  The carotids are 2+ without bruits.  There  is no thyromegaly.  There is no JVD.    Lungs: clear.  The chest wall is non tender.    Heart: regular rate with a normal S1 and S2.  There are no murmurs, gallops, or rubs. The PMI is not displaced.     Abdomen: good bowel sounds.  There is no guarding or rebound.  There is no hepatosplenomegaly or tenderness.  There are no masses.   Extremities:  no clubbing, cyanosis, or edema.  The legs are without rashes.  The distal pulses are intact.   Neuro:  Cranial nerves II - XII are intact.  Motor and sensory functions are intact.    The gait is normal.  ECG: AV sequential pacing.  Assessment / Plan:

## 2012-04-06 ENCOUNTER — Ambulatory Visit (INDEPENDENT_AMBULATORY_CARE_PROVIDER_SITE_OTHER): Payer: Medicare Other | Admitting: *Deleted

## 2012-04-06 DIAGNOSIS — Z7901 Long term (current) use of anticoagulants: Secondary | ICD-10-CM

## 2012-04-06 DIAGNOSIS — I4891 Unspecified atrial fibrillation: Secondary | ICD-10-CM

## 2012-04-06 LAB — POCT INR: INR: 2.9

## 2012-05-02 ENCOUNTER — Other Ambulatory Visit: Payer: Self-pay | Admitting: Cardiovascular Disease

## 2012-05-05 ENCOUNTER — Encounter: Payer: Medicare Other | Admitting: *Deleted

## 2012-05-05 ENCOUNTER — Ambulatory Visit (INDEPENDENT_AMBULATORY_CARE_PROVIDER_SITE_OTHER): Payer: Medicare Other | Admitting: Pharmacist

## 2012-05-05 DIAGNOSIS — Z7901 Long term (current) use of anticoagulants: Secondary | ICD-10-CM

## 2012-05-05 DIAGNOSIS — I4891 Unspecified atrial fibrillation: Secondary | ICD-10-CM

## 2012-05-12 ENCOUNTER — Encounter: Payer: Self-pay | Admitting: *Deleted

## 2012-05-17 ENCOUNTER — Telehealth: Payer: Self-pay | Admitting: Internal Medicine

## 2012-05-17 NOTE — Telephone Encounter (Signed)
New Problem:    Patient called in needing assistance with the wireless transmission process.  Please call back.

## 2012-05-17 NOTE — Telephone Encounter (Signed)
Instructions given for remote transmission.  Patient to resend.

## 2012-06-08 ENCOUNTER — Ambulatory Visit (INDEPENDENT_AMBULATORY_CARE_PROVIDER_SITE_OTHER): Payer: Medicare Other | Admitting: *Deleted

## 2012-06-08 DIAGNOSIS — Z7901 Long term (current) use of anticoagulants: Secondary | ICD-10-CM

## 2012-06-08 DIAGNOSIS — I4891 Unspecified atrial fibrillation: Secondary | ICD-10-CM

## 2012-07-07 ENCOUNTER — Ambulatory Visit (INDEPENDENT_AMBULATORY_CARE_PROVIDER_SITE_OTHER): Payer: Medicare Other | Admitting: *Deleted

## 2012-07-07 DIAGNOSIS — Z7901 Long term (current) use of anticoagulants: Secondary | ICD-10-CM

## 2012-07-07 DIAGNOSIS — I4891 Unspecified atrial fibrillation: Secondary | ICD-10-CM

## 2012-07-12 ENCOUNTER — Encounter: Payer: Self-pay | Admitting: Internal Medicine

## 2012-07-12 ENCOUNTER — Ambulatory Visit (INDEPENDENT_AMBULATORY_CARE_PROVIDER_SITE_OTHER): Payer: Medicare Other | Admitting: *Deleted

## 2012-07-12 ENCOUNTER — Telehealth: Payer: Self-pay | Admitting: *Deleted

## 2012-07-12 DIAGNOSIS — Z95810 Presence of automatic (implantable) cardiac defibrillator: Secondary | ICD-10-CM

## 2012-07-12 DIAGNOSIS — I5022 Chronic systolic (congestive) heart failure: Secondary | ICD-10-CM

## 2012-07-12 NOTE — Telephone Encounter (Signed)
Spoke with patient.  He received DSL filter when seen at coumadin clinic last week.  He attempted to resend transmission, but we still did not receive.  He will try again, verifying that toggle switches are in correct positions.  If transmission still does not go through, we will check at CV[facility] visit on 11-29

## 2012-07-15 LAB — REMOTE ICD DEVICE
BATTERY VOLTAGE: 3.0077 V
BRDY-0003LV: 130 {beats}/min
BRDY-0004LV: 120 {beats}/min
LV LEAD THRESHOLD: 1.125 V
PACEART VT: 0
TOT-0006: 20100611000000
TZAT-0001ATACH: 2
TZAT-0001ATACH: 3
TZAT-0002ATACH: NEGATIVE
TZAT-0002ATACH: NEGATIVE
TZAT-0002SLOWVT: NEGATIVE
TZAT-0012ATACH: 150 ms
TZAT-0018ATACH: NEGATIVE
TZAT-0018ATACH: NEGATIVE
TZAT-0019ATACH: 6 V
TZAT-0019ATACH: 6 V
TZAT-0019ATACH: 6 V
TZAT-0019SLOWVT: 8 V
TZAT-0020FASTVT: 1.5 ms
TZAT-0020SLOWVT: 1.5 ms
TZON-0003SLOWVT: 360 ms
TZON-0003VSLOWVT: 370 ms
TZON-0004SLOWVT: 16
TZON-0004VSLOWVT: 28
TZON-0005SLOWVT: 12
TZST-0001ATACH: 5
TZST-0001ATACH: 6
TZST-0001FASTVT: 3
TZST-0001FASTVT: 5
TZST-0001SLOWVT: 2
TZST-0001SLOWVT: 3
TZST-0002ATACH: NEGATIVE
TZST-0002FASTVT: NEGATIVE
TZST-0002SLOWVT: NEGATIVE
TZST-0002SLOWVT: NEGATIVE
VENTRICULAR PACING ICD: 99.89 pct
VF: 0

## 2012-08-09 ENCOUNTER — Encounter: Payer: Self-pay | Admitting: *Deleted

## 2012-08-17 ENCOUNTER — Ambulatory Visit (INDEPENDENT_AMBULATORY_CARE_PROVIDER_SITE_OTHER): Payer: Medicare Other | Admitting: *Deleted

## 2012-08-17 DIAGNOSIS — I4891 Unspecified atrial fibrillation: Secondary | ICD-10-CM

## 2012-08-17 DIAGNOSIS — Z7901 Long term (current) use of anticoagulants: Secondary | ICD-10-CM

## 2012-08-17 LAB — POCT INR: INR: 2.6

## 2012-10-03 ENCOUNTER — Telehealth: Payer: Self-pay | Admitting: Cardiovascular Disease

## 2012-10-03 NOTE — Telephone Encounter (Signed)
PT NEEDS REFILL OF WARFARIN 5MG , FAX [phone] [facility]

## 2012-10-03 NOTE — Telephone Encounter (Signed)
Patient missed coumadin clinic appt 09/21/2012, called and made him appt for tomorrow, we will do refill at that time.

## 2012-10-04 ENCOUNTER — Ambulatory Visit (INDEPENDENT_AMBULATORY_CARE_PROVIDER_SITE_OTHER): Payer: Medicare Other | Admitting: *Deleted

## 2012-10-04 DIAGNOSIS — Z7901 Long term (current) use of anticoagulants: Secondary | ICD-10-CM

## 2012-10-04 DIAGNOSIS — I4891 Unspecified atrial fibrillation: Secondary | ICD-10-CM

## 2012-10-04 MED ORDER — WARFARIN SODIUM 5 MG PO TABS: ORAL_TABLET | ORAL | Status: DC

## 2012-10-17 ENCOUNTER — Ambulatory Visit (INDEPENDENT_AMBULATORY_CARE_PROVIDER_SITE_OTHER): Payer: Medicare Other | Admitting: *Deleted

## 2012-10-17 ENCOUNTER — Encounter: Payer: Self-pay | Admitting: Internal Medicine

## 2012-10-17 ENCOUNTER — Other Ambulatory Visit: Payer: Self-pay

## 2012-10-17 DIAGNOSIS — I5022 Chronic systolic (congestive) heart failure: Secondary | ICD-10-CM

## 2012-10-17 DIAGNOSIS — I469 Cardiac arrest, cause unspecified: Secondary | ICD-10-CM

## 2012-10-17 DIAGNOSIS — Z95810 Presence of automatic (implantable) cardiac defibrillator: Secondary | ICD-10-CM

## 2012-10-18 ENCOUNTER — Telehealth: Payer: Self-pay | Admitting: Cardiovascular Disease

## 2012-10-18 NOTE — Telephone Encounter (Signed)
Told him to call Dr Elmer Picker and make sure they are aware you are on coumadin and to contact us if need. Pt agreed to plan.

## 2012-10-18 NOTE — Telephone Encounter (Signed)
Pt having cataract surgery on 2/25 by Dr. Elmer Picker he just wanted to give you fyi and pt is not sure if he needs your approval or not

## 2012-10-20 LAB — REMOTE ICD DEVICE
AL IMPEDENCE ICD: 475 Ohm
ATRIAL PACING ICD: 89.74 pct
BAMS-0001: 170 {beats}/min
BRDY-0002LV: 60 {beats}/min
BRDY-0003LV: 130 {beats}/min
BRDY-0004LV: 120 {beats}/min
CHARGE TIME: 10.65 s
FVT: 0
PACEART VT: 0
RV LEAD AMPLITUDE: 12.8 mv
TOT-0001: 0
TOT-0002: 0
TZAT-0001ATACH: 2
TZAT-0001FASTVT: 1
TZAT-0001SLOWVT: 1
TZAT-0002ATACH: NEGATIVE
TZAT-0002FASTVT: NEGATIVE
TZAT-0012ATACH: 150 ms
TZAT-0012FASTVT: 200 ms
TZAT-0018ATACH: NEGATIVE
TZAT-0018FASTVT: NEGATIVE
TZAT-0019ATACH: 6 V
TZAT-0019ATACH: 6 V
TZAT-0020ATACH: 1.5 ms
TZAT-0020ATACH: 1.5 ms
TZAT-0020SLOWVT: 1.5 ms
TZON-0003ATACH: 350 ms
TZON-0003SLOWVT: 360 ms
TZON-0003VSLOWVT: 370 ms
TZST-0001FASTVT: 2
TZST-0001FASTVT: 4
TZST-0001FASTVT: 6
TZST-0001SLOWVT: 2
TZST-0001SLOWVT: 3
TZST-0001SLOWVT: 4
TZST-0002ATACH: NEGATIVE
TZST-0002FASTVT: NEGATIVE
TZST-0002FASTVT: NEGATIVE
TZST-0002FASTVT: NEGATIVE
TZST-0002SLOWVT: NEGATIVE
VENTRICULAR PACING ICD: 99.94 pct

## 2012-11-01 ENCOUNTER — Encounter: Payer: Self-pay | Admitting: *Deleted

## 2012-11-02 LAB — PROTIME-INR: INR: 2.7 — AB (ref <=1.1)

## 2012-11-10 ENCOUNTER — Ambulatory Visit: Payer: Self-pay | Admitting: Cardiology

## 2012-11-10 ENCOUNTER — Other Ambulatory Visit: Payer: Self-pay | Admitting: *Deleted

## 2012-11-10 ENCOUNTER — Telehealth: Payer: Self-pay | Admitting: *Deleted

## 2012-11-10 DIAGNOSIS — I5022 Chronic systolic (congestive) heart failure: Secondary | ICD-10-CM

## 2012-11-10 DIAGNOSIS — Z7901 Long term (current) use of anticoagulants: Secondary | ICD-10-CM

## 2012-11-10 DIAGNOSIS — I4891 Unspecified atrial fibrillation: Secondary | ICD-10-CM

## 2012-11-10 DIAGNOSIS — I509 Heart failure, unspecified: Secondary | ICD-10-CM

## 2012-11-10 MED ORDER — LISINOPRIL 5 MG PO TABS: 5.0000 mg | ORAL_TABLET | Freq: Every day | ORAL | Status: DC

## 2012-11-10 MED ORDER — ATORVASTATIN CALCIUM 40 MG PO TABS: 40.0000 mg | ORAL_TABLET | Freq: Every day | ORAL | Status: DC

## 2012-11-10 NOTE — Telephone Encounter (Signed)
Received call from patient, he states he had a surgical procedure last week at [facility] [address], he states his INR was 2.7, he wants his appt for next week to be cancelled, he refuses to make another appt, states he will call and make that appt at a later date, I have called [facility] (medical records)  and requested INR result from last week.

## 2012-11-10 NOTE — Telephone Encounter (Signed)
Fax Received. Refill Completed. Octavia Chowoe (R.M.A)   

## 2012-11-14 ENCOUNTER — Other Ambulatory Visit: Payer: Self-pay | Admitting: *Deleted

## 2012-11-14 NOTE — Telephone Encounter (Signed)
Opened in Error.

## 2012-11-22 ENCOUNTER — Other Ambulatory Visit: Payer: Self-pay | Admitting: *Deleted

## 2012-11-22 DIAGNOSIS — I509 Heart failure, unspecified: Secondary | ICD-10-CM

## 2012-11-22 DIAGNOSIS — I5022 Chronic systolic (congestive) heart failure: Secondary | ICD-10-CM

## 2012-11-22 DIAGNOSIS — I4891 Unspecified atrial fibrillation: Secondary | ICD-10-CM

## 2012-11-22 MED ORDER — SPIRONOLACTONE 25 MG PO TABS: ORAL_TABLET | ORAL | Status: DC

## 2012-11-22 MED ORDER — CARVEDILOL 12.5 MG PO TABS: 12.5000 mg | ORAL_TABLET | Freq: Two times a day (BID) | ORAL | Status: DC

## 2012-12-07 ENCOUNTER — Telehealth: Payer: Self-pay | Admitting: Cardiovascular Disease

## 2012-12-07 MED ORDER — CARVEDILOL 12.5 MG PO TABS: 12.5000 mg | ORAL_TABLET | Freq: Two times a day (BID) | ORAL | Status: DC

## 2012-12-07 NOTE — Telephone Encounter (Signed)
Phoned in to pharmacy. Pt is aware of this. Pharmacist states they did not received our medication refill request. Informed Christine the pharmacist that on our end we did send refill. Christine Scientist, research (physical sciences)) did say that she will contact her IT people and have them call our manager.

## 2012-12-07 NOTE — Telephone Encounter (Signed)
New problem    Carvedilol  12.5 mg    [facility]

## 2012-12-16 ENCOUNTER — Telehealth: Payer: Self-pay | Admitting: Internal Medicine

## 2012-12-16 NOTE — Telephone Encounter (Signed)
Spoke with wife.  She will have patient send a manuel transmission when possible.

## 2012-12-16 NOTE — Telephone Encounter (Signed)
New Problem:    Patient's daughter called in because her father is receiving mini shocks and is causing his body to jump.  Please call back.

## 2012-12-19 ENCOUNTER — Other Ambulatory Visit: Payer: Self-pay

## 2012-12-19 ENCOUNTER — Ambulatory Visit (INDEPENDENT_AMBULATORY_CARE_PROVIDER_SITE_OTHER): Payer: Medicare Other | Admitting: *Deleted

## 2012-12-19 DIAGNOSIS — I495 Sick sinus syndrome: Secondary | ICD-10-CM

## 2012-12-19 DIAGNOSIS — I5022 Chronic systolic (congestive) heart failure: Secondary | ICD-10-CM

## 2012-12-19 LAB — ICD DEVICE OBSERVATION
AL AMPLITUDE: 2.25 mv
ATRIAL PACING ICD: 90.17 pct
BAMS-0001: 170 {beats}/min
CHARGE TIME: 10.65 s
LV LEAD IMPEDENCE ICD: 855 Ohm
RV LEAD IMPEDENCE ICD: 532 Ohm
RV LEAD THRESHOLD: 0.5 V
TOT-0001: 0
TOT-0006: 20100611000000
TZAT-0001FASTVT: 1
TZAT-0002ATACH: NEGATIVE
TZAT-0002ATACH: NEGATIVE
TZAT-0002ATACH: NEGATIVE
TZAT-0018ATACH: NEGATIVE
TZAT-0019ATACH: 6 V
TZAT-0019ATACH: 6 V
TZAT-0019ATACH: 6 V
TZAT-0019SLOWVT: 8 V
TZAT-0020ATACH: 1.5 ms
TZAT-0020FASTVT: 1.5 ms
TZAT-0020SLOWVT: 1.5 ms
TZON-0003SLOWVT: 360 ms
TZON-0003VSLOWVT: 370 ms
TZON-0004SLOWVT: 16
TZON-0004VSLOWVT: 28
TZON-0005SLOWVT: 12
TZST-0001ATACH: 5
TZST-0001FASTVT: 2
TZST-0001FASTVT: 3
TZST-0001FASTVT: 4
TZST-0001SLOWVT: 2
TZST-0001SLOWVT: 3
TZST-0001SLOWVT: 5
TZST-0002ATACH: NEGATIVE
TZST-0002ATACH: NEGATIVE
TZST-0002FASTVT: NEGATIVE
TZST-0002SLOWVT: NEGATIVE
TZST-0002SLOWVT: NEGATIVE
VENTRICULAR PACING ICD: 99.91 pct
VF: 0

## 2012-12-19 NOTE — Progress Notes (Signed)
ICD check with ICM 

## 2013-01-05 ENCOUNTER — Encounter: Payer: Self-pay | Admitting: Internal Medicine

## 2013-01-19 ENCOUNTER — Ambulatory Visit (INDEPENDENT_AMBULATORY_CARE_PROVIDER_SITE_OTHER): Payer: Medicare Other | Admitting: Internal Medicine

## 2013-01-19 ENCOUNTER — Encounter: Payer: Self-pay | Admitting: Internal Medicine

## 2013-01-19 VITALS — BP 115/74 | HR 78 | Ht 72.0 in | Wt 217.0 lb

## 2013-01-19 DIAGNOSIS — Z95810 Presence of automatic (implantable) cardiac defibrillator: Secondary | ICD-10-CM

## 2013-01-19 DIAGNOSIS — I2589 Other forms of chronic ischemic heart disease: Secondary | ICD-10-CM

## 2013-01-19 DIAGNOSIS — I4891 Unspecified atrial fibrillation: Secondary | ICD-10-CM

## 2013-01-19 DIAGNOSIS — I2510 Atherosclerotic heart disease of native coronary artery without angina pectoris: Secondary | ICD-10-CM

## 2013-01-19 DIAGNOSIS — I255 Ischemic cardiomyopathy: Secondary | ICD-10-CM

## 2013-01-19 DIAGNOSIS — I5022 Chronic systolic (congestive) heart failure: Secondary | ICD-10-CM

## 2013-01-19 DIAGNOSIS — I495 Sick sinus syndrome: Secondary | ICD-10-CM

## 2013-01-19 LAB — ICD DEVICE OBSERVATION
AL AMPLITUDE: 2.6 mv
ATRIAL PACING ICD: 90.29 pct
CHARGE TIME: 10.65 s
FVT: 0
LV LEAD IMPEDENCE ICD: 798 Ohm
RV LEAD AMPLITUDE: 11.4 mv
RV LEAD IMPEDENCE ICD: 532 Ohm
RV LEAD THRESHOLD: 0.5 V
TOT-0001: 0
TOT-0002: 0
TOT-0006: 20100611000000
TZAT-0001ATACH: 1
TZAT-0001ATACH: 2
TZAT-0001FASTVT: 1
TZAT-0002ATACH: NEGATIVE
TZAT-0002ATACH: NEGATIVE
TZAT-0002FASTVT: NEGATIVE
TZAT-0002SLOWVT: NEGATIVE
TZAT-0012FASTVT: 200 ms
TZAT-0018ATACH: NEGATIVE
TZAT-0018ATACH: NEGATIVE
TZAT-0019ATACH: 6 V
TZAT-0019ATACH: 6 V
TZAT-0019SLOWVT: 8 V
TZAT-0020FASTVT: 1.5 ms
TZON-0004VSLOWVT: 32
TZON-0005SLOWVT: 12
TZST-0001FASTVT: 3
TZST-0001FASTVT: 4
TZST-0001FASTVT: 5
TZST-0001SLOWVT: 3
TZST-0002ATACH: NEGATIVE
TZST-0002FASTVT: NEGATIVE
TZST-0002FASTVT: NEGATIVE
TZST-0002FASTVT: NEGATIVE
TZST-0002SLOWVT: NEGATIVE
TZST-0002SLOWVT: NEGATIVE
TZST-0002SLOWVT: NEGATIVE
TZST-0002SLOWVT: NEGATIVE

## 2013-01-19 LAB — BASIC METABOLIC PANEL
CO2: 29 mEq/L (ref 19–32)
Calcium: 9.2 mg/dL (ref 8.4–10.5)
Chloride: 104 mEq/L (ref 96–112)
Potassium: 4.3 mEq/L (ref 3.5–5.1)
Sodium: 138 mEq/L (ref 135–145)

## 2013-01-19 NOTE — Progress Notes (Signed)
Patient Care Team: Kaleen Mask, MD as PCP - General Ashlyn Ninfa Meeker, PA-C as Physician Assistant (Urology)   HPI  [name] is a 72 y.o. male  seen in followup for  CRT upgrade for congestive heart failure in the setting of ischemic heart disease  Initially  following ICD-CRT upgrade he did better The patient denies chest pain, edema or palpitations;         Past Medical History  Diagnosis Date  . Dyslipidemia   . Gout   . BPH (benign prostatic hypertrophy)     SEVERE  . Coronary artery disease     STATUS POST ANTERIOR WALL MYOCARDIAL INFARCTION. HE IS STATUS POST PTCA AND STENTING OF HIS LAD. HIS INITIAL SET WAS COMPLICATED BY SUBACUTE THROMBOSIS RESULTING IN A LARGE ANTERIOR WALL MYOCARDIAL INFARCTIONAND SUDSEQUENT CHF.  He has been on coumadin since that time.  . Myocardial infarction   . Chronic kidney disease     BPH  . Arthritis     knees, back   . CHF (congestive heart failure)     EJECTION FRACTION 30-35%, biV ICD     Past Surgical History  Procedure Laterality Date  . Insert / replace / remove pacemaker      STATUS POST BIVENTRICULAR PACEMAKER/AICD  . Cardiac catheterization  2005  . Coronary angioplasty      5 stents   . Other surgical history      right ear surgery as a child   . Appendectomy    . Knee arthroscopy    . Prostatectomy  11/06/2011    Procedure: PROSTATECTOMY SUPRAPUBIC;  Surgeon: Kathi Ludwig, MD;  Location: [facility];  Service: Urology;  Laterality: N/A;  Open Suprapubic Prostatectomy    Current Outpatient Prescriptions  Medication Sig Dispense Refill  . atorvastatin (LIPITOR) 40 MG tablet Take 1 tablet (40 mg total) by mouth daily.  90 tablet  0  . carvedilol (COREG) 12.5 MG tablet Take 1 tablet (12.5 mg total) by mouth 2 (two) times daily with a meal.  180 tablet  0  . lisinopril (PRINIVIL,ZESTRIL) 5 MG tablet Take 1 tablet (5 mg total) by mouth at bedtime.  90 tablet  0  . Propylene Glycol (SYSTANE BALANCE) 0.6  % SOLN Place 1 drop into both eyes every morning.      [name] spironolactone (ALDACTONE) 25 MG tablet 25 mg.      . warfarin (COUMADIN) 5 MG tablet Take as directed by coumadin clinic  60 tablet  1   No current facility-administered medications for this visit.    Allergies  Allergen Reactions  . Analgesic (Aspirin-Caffeine)     Bladder shut down    Review of Systems negative except from HPI and PMH  Physical Exam BP 115/74  Pulse 78  Ht 6' (1.829 m)  Wt 217 lb (98.431 kg)  BMI 29.42 kg/m2 Well developed and well nourished in no acute distress HENT normal E scleral and icterus clear Neck Supple JVP flat; carotids brisk and full Clear to ausculation  Regular rate and rhythm, no murmurs gallops or rub Soft with active bowel sounds No clubbing cyanosis none Edema Alert and oriented, grossly normal motor and sensory function Skin Warm and Dry    Assessment and  Plan

## 2013-01-19 NOTE — Assessment & Plan Note (Signed)
On guideline directed medical therapy

## 2013-01-19 NOTE — Assessment & Plan Note (Signed)
Euvolemic. Will continue guideline directed medical therapy. We will check potassium and renal function

## 2013-01-19 NOTE — Assessment & Plan Note (Signed)
Stable. Based on recent guidelines, we will discontinue his aspirin that it adjunctive to his warfarin

## 2013-01-19 NOTE — Assessment & Plan Note (Signed)
The patient's device was interrogated and the information was fully reviewed.  The device was reprogrammed to  Lengthen inervals to detect

## 2013-01-19 NOTE — Patient Instructions (Signed)
Your physician has recommended you make the following change in your medication:  1) Stop aspirin ** please check the cost of the following blood thinners to see what is most cost efficient for you- 1) Pradaxa 150 mg one tablet by mouth twice daily, 2) Xarelto 20 mg one tablet by mouth once daily, 3) Eliquis 5 mg one tablet by mouth twice daily.  Your physician recommends that you have lab work today: bmp  Remote monitoring is used to monitor your Pacemaker of ICD from home. This monitoring reduces the number of office visits required to check your device to one time per year. It allows [place] to keep an eye on the functioning of your device to ensure it is working properly. You are scheduled for a device check from home on 04/24/13. You may send your transmission at any time that day. If you have a wireless device, the transmission will be sent automatically. After your physician reviews your transmission, you will receive a postcard with your next transmission date.  Your physician recommends that you schedule a follow-up appointment in: September with Dr. Elease Hashimoto  Your physician wants you to follow-up in: 1 year with Dr. Graciela Husbands. You will receive a reminder letter in the mail two months in advance. If you don't receive a letter, please call our office to schedule the follow-up appointment.

## 2013-01-19 NOTE — Assessment & Plan Note (Signed)
As noted, we will discontinue his aspirin. We have reviewed the NOACs. He will check with the pharmacy.

## 2013-01-23 ENCOUNTER — Telehealth: Payer: Self-pay | Admitting: Internal Medicine

## 2013-01-23 DIAGNOSIS — I4891 Unspecified atrial fibrillation: Secondary | ICD-10-CM

## 2013-01-23 NOTE — Telephone Encounter (Signed)
Spoke with pt and reviewed recent lab results with him. He has checked with his insurance and co-pay for Pradaxa, Xarelto and Eliquis would all be $60.  He is asking which one Dr. Graciela Husbands would recommend for him to start.  Is also requesting samples once medication he should start is determined.

## 2013-01-23 NOTE — Telephone Encounter (Signed)
New problem    Pt stated someone called him concerning results but didn't know who. Please call pt.

## 2013-01-24 NOTE — Telephone Encounter (Signed)
Left message for pt to call, per dr Graciela Husbands, the pt will need to stop warfarin for three days then start eliquis 5 mg bid. Cards and samples placed at there front desk for pick up. He will need bmp and cbc in 4 weeks.

## 2013-01-24 NOTE — Telephone Encounter (Signed)
Left message for pt to call.

## 2013-01-24 NOTE — Telephone Encounter (Signed)
Follow Up       Pt calling in returning phone call from earlier regarding medication. Please call back.

## 2013-01-25 ENCOUNTER — Ambulatory Visit: Payer: Medicare Other | Admitting: Cardiovascular Disease

## 2013-01-27 MED ORDER — APIXABAN 5 MG PO TABS: 5.0000 mg | ORAL_TABLET | Freq: Two times a day (BID) | ORAL | Status: DC

## 2013-01-27 NOTE — Telephone Encounter (Signed)
Follow-up: ° ° ° °Patient called in returning your call.  Please call back. °

## 2013-01-27 NOTE — Telephone Encounter (Signed)
Spoke with pt, aware of dr klein's recommendations and that samples are at the front desk for pick up.

## 2013-02-02 ENCOUNTER — Other Ambulatory Visit: Payer: Self-pay | Admitting: *Deleted

## 2013-02-02 MED ORDER — ATORVASTATIN CALCIUM 40 MG PO TABS: 40.0000 mg | ORAL_TABLET | Freq: Every day | ORAL | Status: DC

## 2013-02-02 MED ORDER — SPIRONOLACTONE 25 MG PO TABS: 25.0000 mg | ORAL_TABLET | Freq: Every day | ORAL | Status: DC

## 2013-02-02 MED ORDER — LISINOPRIL 5 MG PO TABS: 5.0000 mg | ORAL_TABLET | Freq: Every day | ORAL | Status: DC

## 2013-02-02 NOTE — Telephone Encounter (Signed)
Per pt he takes spironolactone 25 mg 1 tab daily, last labs this month were done k+ normal. MAR adjusted, pt has f/u app.

## 2013-02-16 ENCOUNTER — Telehealth: Payer: Self-pay

## 2013-02-16 DIAGNOSIS — I4891 Unspecified atrial fibrillation: Secondary | ICD-10-CM

## 2013-02-16 MED ORDER — APIXABAN 5 MG PO TABS: 5.0000 mg | ORAL_TABLET | Freq: Two times a day (BID) | ORAL | Status: DC

## 2013-02-16 MED ORDER — CARVEDILOL 12.5 MG PO TABS: 12.5000 mg | ORAL_TABLET | Freq: Two times a day (BID) | ORAL | Status: DC

## 2013-02-16 NOTE — Telephone Encounter (Signed)
pt called to rqsy refill for eliquis and carvedilol to be sent to [facility]. refill completed.

## 2013-02-22 ENCOUNTER — Other Ambulatory Visit (INDEPENDENT_AMBULATORY_CARE_PROVIDER_SITE_OTHER): Payer: Medicare Other

## 2013-02-22 DIAGNOSIS — I4891 Unspecified atrial fibrillation: Secondary | ICD-10-CM

## 2013-02-22 LAB — CBC WITH DIFFERENTIAL/PLATELET
Eosinophils Relative: 3.3 % (ref 0.0–5.0)
Lymphocytes Relative: 23.9 % (ref 12.0–46.0)
MCV: 95.4 fl (ref 78.0–100.0)
Monocytes Absolute: 0.5 10*3/uL (ref 0.1–1.0)
Neutrophils Relative %: 64 % (ref 43.0–77.0)
Platelets: 233 10*3/uL (ref 150.0–400.0)
WBC: 5.9 10*3/uL (ref 4.5–10.5)

## 2013-02-22 LAB — BASIC METABOLIC PANEL
BUN: 18 mg/dL (ref 6–23)
Calcium: 9.1 mg/dL (ref 8.4–10.5)
Chloride: 106 mEq/L (ref 96–112)
Creatinine, Ser: 1.1 mg/dL (ref 0.4–1.5)
GFR: 72.72 mL/min (ref >60.00)

## 2013-02-27 ENCOUNTER — Other Ambulatory Visit: Payer: Self-pay | Admitting: Cardiovascular Disease

## 2013-02-27 ENCOUNTER — Ambulatory Visit: Payer: Self-pay | Admitting: Pharmacist

## 2013-02-27 DIAGNOSIS — I4891 Unspecified atrial fibrillation: Secondary | ICD-10-CM

## 2013-02-27 DIAGNOSIS — Z7901 Long term (current) use of anticoagulants: Secondary | ICD-10-CM

## 2013-02-28 NOTE — Telephone Encounter (Signed)
Fax Received. Refill Completed. [name] (R.M.A)  NEED APPOINTMENT

## 2013-03-06 ENCOUNTER — Other Ambulatory Visit: Payer: Self-pay

## 2013-03-06 DIAGNOSIS — I4891 Unspecified atrial fibrillation: Secondary | ICD-10-CM

## 2013-03-06 MED ORDER — APIXABAN 5 MG PO TABS: 5.0000 mg | ORAL_TABLET | Freq: Two times a day (BID) | ORAL | Status: DC

## 2013-03-07 ENCOUNTER — Telehealth: Payer: Self-pay

## 2013-03-07 DIAGNOSIS — I4891 Unspecified atrial fibrillation: Secondary | ICD-10-CM

## 2013-03-07 MED ORDER — APIXABAN 5 MG PO TABS: 5.0000 mg | ORAL_TABLET | Freq: Two times a day (BID) | ORAL | Status: DC

## 2013-03-14 ENCOUNTER — Telehealth: Payer: Self-pay

## 2013-03-14 NOTE — Telephone Encounter (Signed)
Eliquis needs a prior authorization and Carvedilol was denied June. Pt called here angry and wanting refills sent in. Told him I will have to send a message to the nurse.

## 2013-03-15 ENCOUNTER — Other Ambulatory Visit: Payer: Self-pay | Admitting: *Deleted

## 2013-03-15 MED ORDER — CARVEDILOL 12.5 MG PO TABS: ORAL_TABLET | ORAL | Status: DC

## 2013-03-15 NOTE — Telephone Encounter (Signed)
Left msg to call back to discuss meds and needs.

## 2013-03-15 NOTE — Telephone Encounter (Signed)
Pt made aware that I sent for pa paperwork for the eliquis but per pharmacist the pt just needed a refill for carvedilol, i gave verbal order.

## 2013-03-21 ENCOUNTER — Telehealth: Payer: Self-pay | Admitting: *Deleted

## 2013-03-21 MED ORDER — CARVEDILOL 12.5 MG PO TABS: ORAL_TABLET | ORAL | Status: DC

## 2013-03-21 NOTE — Telephone Encounter (Signed)
Informed pt that i will be faxing PA for eliquis and I resent request for carvedilol refill / pt unsure why he was denied carvedilol from [facility]. It may be covered if he gets a 25 mg tab and takes 1/2 bid instead of 12.5 mg tablet but I resent to see what the reply was.

## 2013-04-03 ENCOUNTER — Other Ambulatory Visit: Payer: Self-pay | Admitting: Internal Medicine

## 2013-04-24 ENCOUNTER — Ambulatory Visit (INDEPENDENT_AMBULATORY_CARE_PROVIDER_SITE_OTHER): Payer: Medicare Other | Admitting: *Deleted

## 2013-04-24 DIAGNOSIS — Z95810 Presence of automatic (implantable) cardiac defibrillator: Secondary | ICD-10-CM

## 2013-04-24 DIAGNOSIS — I5022 Chronic systolic (congestive) heart failure: Secondary | ICD-10-CM

## 2013-04-24 DIAGNOSIS — I2589 Other forms of chronic ischemic heart disease: Secondary | ICD-10-CM

## 2013-04-24 DIAGNOSIS — I255 Ischemic cardiomyopathy: Secondary | ICD-10-CM

## 2013-05-05 LAB — REMOTE ICD DEVICE
AL AMPLITUDE: 2.6 mv
AL IMPEDENCE ICD: 494 Ohm
BAMS-0001: 170 {beats}/min
CHARGE TIME: 10.99 s
FVT: 0
RV LEAD AMPLITUDE: 11.6 mv
RV LEAD IMPEDENCE ICD: 475 Ohm
TOT-0001: 0
TOT-0002: 0
TZAT-0001ATACH: 1
TZAT-0002ATACH: NEGATIVE
TZAT-0012ATACH: 150 ms
TZAT-0012FASTVT: 200 ms
TZAT-0012SLOWVT: 200 ms
TZAT-0018ATACH: NEGATIVE
TZAT-0018FASTVT: NEGATIVE
TZAT-0019ATACH: 6 V
TZAT-0019ATACH: 6 V
TZAT-0019ATACH: 6 V
TZAT-0020ATACH: 1.5 ms
TZAT-0020ATACH: 1.5 ms
TZAT-0020FASTVT: 1.5 ms
TZAT-0020SLOWVT: 1.5 ms
TZON-0003ATACH: 350 ms
TZON-0003SLOWVT: 360 ms
TZST-0001ATACH: 4
TZST-0001ATACH: 5
TZST-0001FASTVT: 2
TZST-0001FASTVT: 4
TZST-0001FASTVT: 6
TZST-0001SLOWVT: 5
TZST-0002ATACH: NEGATIVE
TZST-0002FASTVT: NEGATIVE
TZST-0002SLOWVT: NEGATIVE
TZST-0002SLOWVT: NEGATIVE
TZST-0002SLOWVT: NEGATIVE

## 2013-05-18 ENCOUNTER — Encounter: Payer: Self-pay | Admitting: Cardiovascular Disease

## 2013-05-25 ENCOUNTER — Encounter: Payer: Self-pay | Admitting: Cardiovascular Disease

## 2013-05-29 ENCOUNTER — Encounter: Payer: Self-pay | Admitting: Cardiovascular Disease

## 2013-05-29 ENCOUNTER — Ambulatory Visit (INDEPENDENT_AMBULATORY_CARE_PROVIDER_SITE_OTHER): Payer: Medicare Other | Admitting: Cardiovascular Disease

## 2013-05-29 VITALS — BP 90/60 | HR 72 | Ht 72.0 in | Wt 218.8 lb

## 2013-05-29 DIAGNOSIS — I10 Essential (primary) hypertension: Secondary | ICD-10-CM

## 2013-05-29 DIAGNOSIS — I4891 Unspecified atrial fibrillation: Secondary | ICD-10-CM

## 2013-05-29 DIAGNOSIS — I2510 Atherosclerotic heart disease of native coronary artery without angina pectoris: Secondary | ICD-10-CM

## 2013-05-29 DIAGNOSIS — E785 Hyperlipidemia, unspecified: Secondary | ICD-10-CM

## 2013-05-29 LAB — BASIC METABOLIC PANEL
BUN: 19 mg/dL (ref 6–23)
CO2: 29 mEq/L (ref 19–32)
Chloride: 101 mEq/L (ref 96–112)
Glucose, Bld: 181 mg/dL — ABNORMAL HIGH (ref 70–99)
Potassium: 4.4 mEq/L (ref 3.5–5.1)
Sodium: 135 mEq/L (ref 135–145)

## 2013-05-29 LAB — HEPATIC FUNCTION PANEL
ALT: 21 U/L (ref 0–53)
Bilirubin, Direct: 0 mg/dL (ref 0.0–0.3)
Total Bilirubin: 0.9 mg/dL (ref 0.3–1.2)

## 2013-05-29 LAB — LIPID PANEL
HDL: 28.3 mg/dL — ABNORMAL LOW (ref >39.00)
LDL Cholesterol: 49 mg/dL (ref 0–99)
Total CHOL/HDL Ratio: 4
VLDL: 32 mg/dL (ref 0.0–40.0)

## 2013-05-29 NOTE — Assessment & Plan Note (Signed)
We'll check fasting lipids, liver enzymes, and basic metabolic profile today. 

## 2013-05-29 NOTE — Patient Instructions (Addendum)
Your physician wants you to follow-up in: 6 months You will receive a reminder letter in the mail two months in advance. If you don't receive a letter, please call our office to schedule the follow-up appointment.   Your physician recommends that you return for lab work in: today bmet lipid liver

## 2013-05-29 NOTE — Progress Notes (Signed)
[name] Date of Birth  Jan 30, 1941 [facility] / [facility] [address]     [address] [place], [place]  [address] [phone]  Fax  [phone]  Problem list: 1. Coronary artery disease-status post pedis anterior wall myocardial infarction 2. Congestive heart failure-EF of 30-35%. He has episodes of hypotension related to his CHF medications 3. Biventricular pacemaker/AICD 4. Dyslipidemia 5. BPH-status post recent prostate surgery 6. Atrial fibrillation  History of Present Illness:  [name] is a 72 year old gentleman with a history of coronary disease and previous anterior wall myocardial infarction.  He has a history of congestive heart failure. His ejection fraction is 30-35%.  He has been on coumadin since his large anterior MI and subsequent CHF.    He has a biventricular pacemaker/AICD. He also has a history of dyslipidemia. He has severe benign prostatic hypertrophy.  He denies any cardiac complaints today. He recently had prostate surgery.  His BP has been low since his prostate surgery.  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 22, 2014:  [name] is doing OK.  BP is low - feels sluggish.  He is able to do most of his normal activities most days.  No CP or dypsnea.   He walks about a mile every day.  He is having lots of muscle aches from the lipitor.  He wants to take Co-Q 10.   He has questions re:  His Optivol and if his device is working properly.  He has been diagnosed with having atrial fibrillation and is on Eliquis.                                                              Current Outpatient Prescriptions on File Prior to Visit  Medication Sig Dispense Refill  . atorvastatin (LIPITOR) 40 MG tablet Take 1 tablet (40 mg total) by mouth daily.  90 tablet  1  . carvedilol (COREG) 12.5 MG tablet Take 1 tablet (12.5 mg  total) by mouth 2 (two)  times daily with a meal.  180 tablet  3  . ELIQUIS 5 MG  TABS tablet Take 1 tablet (5 mg total)  by mouth 2 (two) times  daily.  180 tablet  3  . lisinopril (PRINIVIL,ZESTRIL) 5 MG tablet Take 1 tablet (5 mg total) by mouth at bedtime.  90 tablet  1  . spironolactone (ALDACTONE) 25 MG tablet Take 1 tablet (25 mg total) by mouth daily.  90 tablet  1   No current facility-administered medications on file prior to visit.  spirinolcatone 25 mg a day   Allergies  Allergen Reactions  . Analgesic [Aspirin-Caffeine]     Bladder shut down    Past Medical History  Diagnosis Date  . Dyslipidemia   . Gout   . BPH (benign prostatic hypertrophy)     SEVERE  . Coronary artery disease     STATUS POST ANTERIOR WALL MYOCARDIAL INFARCTION. HE IS STATUS POST PTCA AND STENTING OF HIS LAD. HIS INITIAL SET WAS COMPLICATED BY SUBACUTE THROMBOSIS RESULTING IN A LARGE ANTERIOR WALL MYOCARDIAL INFARCTIONAND SUDSEQUENT CHF.  He has been on coumadin since that time.  . Myocardial infarction   . Chronic kidney disease     BPH  . Arthritis  knees, back   . CHF (congestive heart failure)     EJECTION FRACTION 30-35%, biV ICD     Past Surgical History  Procedure Laterality Date  . Insert / replace / remove pacemaker      STATUS POST BIVENTRICULAR PACEMAKER/AICD  . Cardiac catheterization  2005  . Coronary angioplasty      5 stents   . Other surgical history      right ear surgery as a child   . Appendectomy    . Knee arthroscopy    . Prostatectomy  11/06/2011    Procedure: PROSTATECTOMY SUPRAPUBIC;  Surgeon: Kathi Ludwig, MD;  Location: [facility];  Service: Urology;  Laterality: N/A;  Open Suprapubic Prostatectomy    History  Smoking status  . Never Smoker   Smokeless tobacco  . Never Used    History  Alcohol Use No    Comment: occasional beer     No family history on file.  Reviw of Systems:  Reviewed in the HPI.  All other systems are negative.  Physical Exam: BP 90/60  Pulse 72  Ht 6' (1.829 m)  Wt 218 lb 12.8 oz (99.247 kg)   BMI 29.67 kg/m2 The patient is alert and oriented x 3.  The mood and affect are normal.   Skin: warm and dry.  Color is normal.    HEENT:   the sclera are nonicteric.  The mucous membranes are moist.  The carotids are 2+ without bruits.  There is no thyromegaly.  There is no JVD.    Lungs: clear.  The chest wall is non tender.    Heart: regular rate with a normal S1 and S2.  There are no murmurs, gallops, or rubs. The PMI is not displaced.     Abdomen: good bowel sounds.  There is no guarding or rebound.  There is no hepatosplenomegaly or tenderness.  There are no masses.   Extremities:  no clubbing, cyanosis, or edema.  The legs are without rashes.  The distal pulses are intact.   Neuro:  Cranial nerves II - XII are intact.  Motor and sensory functions are intact.    The gait is normal.  ECG: Sept. 22, 2014:  AV sequential pacing at 72  Assessment / Plan:

## 2013-05-29 NOTE — Assessment & Plan Note (Signed)
He currently is in sinus rhythm/atrial pacing. We'll continue with then Eliquis.

## 2013-05-29 NOTE — Assessment & Plan Note (Signed)
He stable, no angina. We will check a lipid profile and other labs today.

## 2013-05-29 NOTE — Assessment & Plan Note (Signed)
Continue with his current medications. He does complain of fatigue which may be due to his relatively low blood pressure. I told him that I would like him to continue with his current medications if possible. If he develops hypertension to the point where he is unable to do his normal activities and we will need to decrease the dose of his ACE inhibitor or beta blocker.

## 2013-05-30 ENCOUNTER — Encounter: Payer: Self-pay | Admitting: *Deleted

## 2013-05-31 ENCOUNTER — Telehealth: Payer: Self-pay | Admitting: Cardiovascular Disease

## 2013-05-31 NOTE — Telephone Encounter (Signed)
msg left with results

## 2013-05-31 NOTE — Telephone Encounter (Signed)
New Problem:  Pt states he would like to know the results from his blood work.

## 2013-06-07 ENCOUNTER — Encounter: Payer: Self-pay | Admitting: Internal Medicine

## 2013-07-13 ENCOUNTER — Other Ambulatory Visit: Payer: Self-pay

## 2013-07-29 ENCOUNTER — Other Ambulatory Visit: Payer: Self-pay | Admitting: Cardiovascular Disease

## 2013-07-31 ENCOUNTER — Ambulatory Visit (INDEPENDENT_AMBULATORY_CARE_PROVIDER_SITE_OTHER): Payer: Medicare Other | Admitting: *Deleted

## 2013-07-31 ENCOUNTER — Encounter: Payer: Self-pay | Admitting: Internal Medicine

## 2013-07-31 DIAGNOSIS — I4891 Unspecified atrial fibrillation: Secondary | ICD-10-CM

## 2013-07-31 DIAGNOSIS — I495 Sick sinus syndrome: Secondary | ICD-10-CM

## 2013-07-31 DIAGNOSIS — I5022 Chronic systolic (congestive) heart failure: Secondary | ICD-10-CM

## 2013-08-01 LAB — MDC_IDC_ENUM_SESS_TYPE_REMOTE
Battery Voltage: 2.87 V
Brady Statistic AP VP Percent: 91.08 %
Brady Statistic AP VS Percent: 0.06 %
Brady Statistic AS VP Percent: 8.66 %
Brady Statistic AS VS Percent: 0.2 %
Brady Statistic RV Percent Paced: 99.74 %
HighPow Impedance: 49 Ohm
HighPow Impedance: 62 Ohm
Lead Channel Impedance Value: 1216 Ohm
Lead Channel Impedance Value: 532 Ohm
Lead Channel Impedance Value: 741 Ohm
Lead Channel Sensing Intrinsic Amplitude: 2.625 mV
Lead Channel Setting Pacing Amplitude: 2 V
Lead Channel Setting Pacing Amplitude: 2.25 V
Lead Channel Setting Pacing Amplitude: 2.5 V
Lead Channel Setting Pacing Pulse Width: 0.8 ms
Lead Channel Setting Sensing Sensitivity: 0.3 mV
Zone Setting Detection Interval: 320 ms
Zone Setting Detection Interval: 360 ms
Zone Setting Detection Interval: 370 ms

## 2013-08-16 ENCOUNTER — Encounter: Payer: Self-pay | Admitting: *Deleted

## 2013-10-02 ENCOUNTER — Other Ambulatory Visit: Payer: Self-pay | Admitting: Cardiovascular Disease

## 2013-10-05 ENCOUNTER — Other Ambulatory Visit: Payer: Self-pay

## 2013-10-05 MED ORDER — SPIRONOLACTONE 25 MG PO TABS: ORAL_TABLET | ORAL | Status: DC

## 2013-10-05 MED ORDER — ATORVASTATIN CALCIUM 40 MG PO TABS: ORAL_TABLET | ORAL | Status: DC

## 2013-10-05 MED ORDER — CARVEDILOL 12.5 MG PO TABS: ORAL_TABLET | ORAL | Status: DC

## 2013-10-05 MED ORDER — APIXABAN 5 MG PO TABS: ORAL_TABLET | ORAL | Status: DC

## 2013-10-05 MED ORDER — LISINOPRIL 5 MG PO TABS: ORAL_TABLET | ORAL | Status: DC

## 2013-11-03 ENCOUNTER — Ambulatory Visit (INDEPENDENT_AMBULATORY_CARE_PROVIDER_SITE_OTHER): Payer: Medicare Other | Admitting: *Deleted

## 2013-11-03 DIAGNOSIS — I4891 Unspecified atrial fibrillation: Secondary | ICD-10-CM

## 2013-11-03 DIAGNOSIS — I2589 Other forms of chronic ischemic heart disease: Secondary | ICD-10-CM

## 2013-11-03 DIAGNOSIS — I255 Ischemic cardiomyopathy: Secondary | ICD-10-CM

## 2013-11-03 DIAGNOSIS — I5022 Chronic systolic (congestive) heart failure: Secondary | ICD-10-CM

## 2013-11-03 DIAGNOSIS — I495 Sick sinus syndrome: Secondary | ICD-10-CM

## 2013-11-03 LAB — MDC_IDC_ENUM_SESS_TYPE_REMOTE
Battery Voltage: 2.78 V
Brady Statistic AP VS Percent: 0.06 %
Brady Statistic AS VS Percent: 0 %
Date Time Interrogation Session: 20150225193234
HIGH POWER IMPEDANCE MEASURED VALUE: 48 Ohm
HIGH POWER IMPEDANCE MEASURED VALUE: 60 Ohm
Lead Channel Impedance Value: 1064 Ohm
Lead Channel Impedance Value: 494 Ohm
Lead Channel Impedance Value: 532 Ohm
Lead Channel Impedance Value: 551 Ohm
Lead Channel Impedance Value: 703 Ohm
Lead Channel Pacing Threshold Amplitude: 1.25 V
Lead Channel Pacing Threshold Pulse Width: 0.8 ms
Lead Channel Sensing Intrinsic Amplitude: 2.375 mV
Lead Channel Setting Pacing Amplitude: 2.25 V
Lead Channel Setting Pacing Pulse Width: 0.4 ms
Lead Channel Setting Pacing Pulse Width: 0.8 ms
Lead Channel Setting Sensing Sensitivity: 0.3 mV
MDC IDC MSMT LEADCHNL RA SENSING INTR AMPL: 2.375 mV
MDC IDC MSMT LEADCHNL RV SENSING INTR AMPL: 10.125 mV
MDC IDC MSMT LEADCHNL RV SENSING INTR AMPL: 10.125 mV
MDC IDC SET LEADCHNL RA PACING AMPLITUDE: 2 V
MDC IDC SET LEADCHNL RV PACING AMPLITUDE: 2.5 V
MDC IDC SET ZONE DETECTION INTERVAL: 350 ms
MDC IDC SET ZONE DETECTION INTERVAL: 360 ms
MDC IDC SET ZONE DETECTION INTERVAL: 370 ms
MDC IDC STAT BRADY AP VP PERCENT: 90.54 %
MDC IDC STAT BRADY AS VP PERCENT: 9.4 %
MDC IDC STAT BRADY RA PERCENT PACED: 90.6 %
MDC IDC STAT BRADY RV PERCENT PACED: 99.94 %
Zone Setting Detection Interval: 320 ms

## 2013-11-27 ENCOUNTER — Encounter: Payer: Self-pay | Admitting: *Deleted

## 2013-12-05 ENCOUNTER — Encounter: Payer: Self-pay | Admitting: Internal Medicine

## 2013-12-11 ENCOUNTER — Encounter: Payer: Self-pay | Admitting: Cardiovascular Disease

## 2013-12-11 ENCOUNTER — Ambulatory Visit (INDEPENDENT_AMBULATORY_CARE_PROVIDER_SITE_OTHER): Payer: Medicare Other | Admitting: Cardiovascular Disease

## 2013-12-11 VITALS — BP 90/60 | HR 63 | Ht 72.0 in | Wt 221.1 lb

## 2013-12-11 DIAGNOSIS — E785 Hyperlipidemia, unspecified: Secondary | ICD-10-CM

## 2013-12-11 DIAGNOSIS — I2510 Atherosclerotic heart disease of native coronary artery without angina pectoris: Secondary | ICD-10-CM

## 2013-12-11 DIAGNOSIS — I4891 Unspecified atrial fibrillation: Secondary | ICD-10-CM

## 2013-12-11 DIAGNOSIS — I5022 Chronic systolic (congestive) heart failure: Secondary | ICD-10-CM

## 2013-12-11 DIAGNOSIS — I10 Essential (primary) hypertension: Secondary | ICD-10-CM

## 2013-12-11 LAB — BASIC METABOLIC PANEL
BUN: 15 mg/dL (ref 6–23)
CALCIUM: 9.1 mg/dL (ref 8.4–10.5)
CO2: 28 meq/L (ref 19–32)
CREATININE: 0.9 mg/dL (ref 0.4–1.5)
Chloride: 104 mEq/L (ref 96–112)
GFR: 83.35 mL/min (ref >60.00)
GLUCOSE: 102 mg/dL — AB (ref 70–99)
Potassium: 4.1 mEq/L (ref 3.5–5.1)
Sodium: 139 mEq/L (ref 135–145)

## 2013-12-11 LAB — LIPID PANEL
CHOL/HDL RATIO: 4
Cholesterol: 109 mg/dL (ref 0–200)
HDL: 28.6 mg/dL — ABNORMAL LOW (ref >39.00)
LDL Cholesterol: 62 mg/dL (ref 0–99)
Triglycerides: 90 mg/dL (ref 0.0–149.0)
VLDL: 18 mg/dL (ref 0.0–40.0)

## 2013-12-11 LAB — HEPATIC FUNCTION PANEL
ALK PHOS: 65 U/L (ref 39–117)
ALT: 18 U/L (ref 0–53)
AST: 19 U/L (ref 0–37)
Albumin: 3.6 g/dL (ref 3.5–5.2)
Bilirubin, Direct: 0.1 mg/dL (ref 0.0–0.3)
Total Bilirubin: 0.8 mg/dL (ref 0.3–1.2)
Total Protein: 6.7 g/dL (ref 6.0–8.3)

## 2013-12-11 MED ORDER — ROSUVASTATIN CALCIUM 10 MG PO TABS: 10.0000 mg | ORAL_TABLET | Freq: Every day | ORAL | Status: DC

## 2013-12-11 NOTE — Assessment & Plan Note (Signed)
No symptoms 

## 2013-12-11 NOTE — Assessment & Plan Note (Signed)
We'll check fasting lipids, liver enzymes, and basic metabolic profile today. He thinks that the atorvastatin was causing lots of muscle aches and joint problems. We'll change him to Crestor 10 mg a day. We'll need to check his lipids, liver enzymes, and basic metabolic profile in 3 months.

## 2013-12-11 NOTE — Patient Instructions (Signed)
Your physician recommends that you return for lab work in: TODAY BMET LIPID LIVER  Your physician has recommended you make the following change in your medication:  STOP ATORVASTATIN START CRESTOR 10 MG EACH EVENING  Your physician wants you to follow-up in:6 MONTHS  You will receive a reminder letter in the mail two months in advance. If you don't receive a letter, please call our office to schedule the follow-up appointment.   Your physician recommends that you return for a FASTING lipid profile: 6 MONTHS

## 2013-12-11 NOTE — Assessment & Plan Note (Signed)
His heart failure seems to be well controlled. He's tolerating low-dose beta blocker, ACE inhibitor, and spironolactone. His blood pressure chronically low but he is basically asymptomatic. Continue with his current medications.

## 2013-12-11 NOTE — Progress Notes (Signed)
[name] Date of Birth  1941-05-02 [facility] [address]     [address] [place], [place]  [id number] [phone]  Fax  [phone]  Problem list: 1. Coronary artery disease-status post pedis anterior wall myocardial infarction 2. Congestive heart failure-EF of 30-35%. He has episodes of hypotension related to his CHF medications 3. Biventricular pacemaker/AICD 4. Dyslipidemia 5. BPH-status post recent prostate surgery 6. Atrial fibrillation  History of Present Illness:  [name] is a [age] y.o. -year-old gentleman with a history of coronary disease and previous anterior wall myocardial infarction.  He has a history of congestive heart failure. His ejection fraction is 30-35%.  He has been on coumadin since his large anterior MI and subsequent CHF.    He has a biventricular pacemaker/AICD. He also has a history of dyslipidemia. He has severe benign prostatic hypertrophy.  He denies any cardiac complaints today. He recently had prostate surgery.  His BP has been low since his prostate surgery.  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 22, 2014:  [name] is doing OK.  BP is low - feels sluggish.  He is able to do most of his normal activities most days.  No CP or dypsnea.   He walks about a mile every day.  He is having lots of muscle aches from the lipitor.  He wants to take Co-Q 10.   He has questions re:  His Optivol and if his device is working properly. He has been diagnosed with having atrial fibrillation and is on Eliquis.   December 11, 2013:  He is doing well.  He complains of sore joints and thinks that it is at least partially due to the atorvastatin.   He would like to try Crestor instead.   Current Outpatient Prescriptions on File Prior to Visit  Medication Sig Dispense Refill  . apixaban (ELIQUIS) 5 MG TABS tablet Take 1 tablet (5 mg total)  by mouth 2 (two) times  daily.  180 tablet  3  .  atorvastatin (LIPITOR) 40 MG tablet Take 1 tablet by mouth  daily  90 tablet  1  . carvedilol (COREG) 12.5 MG tablet Take 1 tablet (12.5 mg  total) by mouth 2 (two)  times daily with a meal.  180 tablet  3  . lisinopril (PRINIVIL,ZESTRIL) 5 MG tablet Take 1 tablet by mouth  every night at bedtime  90 tablet  1  . spironolactone (ALDACTONE) 25 MG tablet Take 1 tablet by mouth  daily  90 tablet  1   No current facility-administered medications on file prior to visit.  spirinolcatone 25 mg a day   Allergies  Allergen Reactions  . Analgesic [Aspirin-Caffeine]     Bladder shut down    Past Medical History  Diagnosis Date  . Dyslipidemia   . Gout   . BPH (benign prostatic hypertrophy)     SEVERE  . Coronary artery disease     STATUS POST ANTERIOR WALL MYOCARDIAL INFARCTION. HE IS STATUS POST PTCA AND STENTING OF HIS LAD. HIS INITIAL SET WAS COMPLICATED BY SUBACUTE THROMBOSIS RESULTING IN A LARGE ANTERIOR WALL MYOCARDIAL INFARCTIONAND SUDSEQUENT CHF.  He has been on coumadin since that time.  . Myocardial infarction   . Chronic kidney disease     BPH  . Arthritis     knees, back   . CHF (congestive heart failure)     EJECTION FRACTION 30-35%, biV ICD  Past Surgical History  Procedure Laterality Date  . Insert / replace / remove pacemaker      STATUS POST BIVENTRICULAR PACEMAKER/AICD  . Cardiac catheterization  2005  . Coronary angioplasty      5 stents   . Other surgical history      right ear surgery as a child   . Appendectomy    . Knee arthroscopy    . Prostatectomy  11/06/2011    Procedure: PROSTATECTOMY SUPRAPUBIC;  Surgeon: Kathi Ludwig, MD;  Location: [facility];  Service: Urology;  Laterality: N/A;  Open Suprapubic Prostatectomy    History  Smoking status  . Never Smoker   Smokeless tobacco  . Never Used    History  Alcohol Use No    Comment: occasional beer     No family history on file.  Reviw of Systems:  Reviewed in the HPI.  All other systems  are negative.  Physical Exam: BP 90/60  Pulse 63  Ht 6' (1.829 m)  Wt 221 lb 1.9 oz (100.299 kg)  BMI 29.98 kg/m2 The patient is alert and oriented x 3.  The mood and affect are normal.   Skin: warm and dry.  Color is normal.    HEENT:   the sclera are nonicteric.  The mucous membranes are moist.  The carotids are 2+ without bruits.  There is no thyromegaly.  There is no JVD.    Lungs: clear.  The chest wall is non tender.    Heart: regular rate with a normal S1 and S2.  There are no murmurs, gallops, or rubs. The PMI is not displaced.     Abdomen: good bowel sounds.  There is no guarding or rebound.  There is no hepatosplenomegaly or tenderness.  There are no masses.   Extremities:  no clubbing, cyanosis, or edema.  The legs are without rashes.  The distal pulses are intact.   Neuro:  Cranial nerves II - XII are intact.  Motor and sensory functions are intact.    The gait is normal.  ECG: 12/11/2013: AV sequential pacing.  Assessment / Plan:

## 2013-12-11 NOTE — Assessment & Plan Note (Signed)
James BurdockRichard is doing well. He is currently maintaining sinus rhythm. He is atrial pacing with ventricular pacing. Continue with his current medications.

## 2014-01-05 ENCOUNTER — Other Ambulatory Visit: Payer: Self-pay

## 2014-01-05 MED ORDER — SPIRONOLACTONE 25 MG PO TABS: ORAL_TABLET | ORAL | Status: DC

## 2014-01-05 MED ORDER — LISINOPRIL 5 MG PO TABS: ORAL_TABLET | ORAL | Status: DC

## 2014-01-19 ENCOUNTER — Encounter: Payer: Medicare Other | Admitting: Internal Medicine

## 2014-01-26 ENCOUNTER — Encounter: Payer: Self-pay | Admitting: *Deleted

## 2014-03-01 ENCOUNTER — Encounter: Payer: Self-pay | Admitting: Internal Medicine

## 2014-03-01 ENCOUNTER — Ambulatory Visit (INDEPENDENT_AMBULATORY_CARE_PROVIDER_SITE_OTHER): Payer: Medicare Other | Admitting: Internal Medicine

## 2014-03-01 ENCOUNTER — Other Ambulatory Visit (INDEPENDENT_AMBULATORY_CARE_PROVIDER_SITE_OTHER): Payer: Medicare Other

## 2014-03-01 VITALS — BP 120/67 | HR 84 | Ht 72.0 in | Wt 222.0 lb

## 2014-03-01 DIAGNOSIS — I10 Essential (primary) hypertension: Secondary | ICD-10-CM

## 2014-03-01 DIAGNOSIS — I4891 Unspecified atrial fibrillation: Secondary | ICD-10-CM

## 2014-03-01 DIAGNOSIS — I482 Chronic atrial fibrillation, unspecified: Secondary | ICD-10-CM

## 2014-03-01 DIAGNOSIS — E785 Hyperlipidemia, unspecified: Secondary | ICD-10-CM

## 2014-03-01 DIAGNOSIS — I2510 Atherosclerotic heart disease of native coronary artery without angina pectoris: Secondary | ICD-10-CM

## 2014-03-01 DIAGNOSIS — I2589 Other forms of chronic ischemic heart disease: Secondary | ICD-10-CM

## 2014-03-01 DIAGNOSIS — I480 Paroxysmal atrial fibrillation: Secondary | ICD-10-CM

## 2014-03-01 DIAGNOSIS — I5022 Chronic systolic (congestive) heart failure: Secondary | ICD-10-CM

## 2014-03-01 DIAGNOSIS — I255 Ischemic cardiomyopathy: Secondary | ICD-10-CM

## 2014-03-01 LAB — MDC_IDC_ENUM_SESS_TYPE_INCLINIC
Brady Statistic AP VS Percent: 0.06 %
Brady Statistic AS VS Percent: 0.36 %
Brady Statistic RA Percent Paced: 89.92 %
HIGH POWER IMPEDANCE MEASURED VALUE: 50 Ohm
HighPow Impedance: 65 Ohm
Lead Channel Impedance Value: 494 Ohm
Lead Channel Impedance Value: 532 Ohm
Lead Channel Impedance Value: 532 Ohm
Lead Channel Impedance Value: 779 Ohm
Lead Channel Pacing Threshold Amplitude: 0.5 V
Lead Channel Pacing Threshold Pulse Width: 0.4 ms
Lead Channel Pacing Threshold Pulse Width: 0.8 ms
Lead Channel Sensing Intrinsic Amplitude: 11.625 mV
Lead Channel Sensing Intrinsic Amplitude: 12.5 mV
Lead Channel Sensing Intrinsic Amplitude: 2.625 mV
Lead Channel Setting Pacing Amplitude: 2 V
Lead Channel Setting Pacing Amplitude: 2.5 V
Lead Channel Setting Pacing Amplitude: 2.5 V
Lead Channel Setting Pacing Pulse Width: 0.4 ms
Lead Channel Setting Pacing Pulse Width: 0.4 ms
MDC IDC MSMT BATTERY VOLTAGE: 2.67 V
MDC IDC MSMT LEADCHNL LV IMPEDANCE VALUE: 1159 Ohm
MDC IDC MSMT LEADCHNL LV PACING THRESHOLD AMPLITUDE: 1 V
MDC IDC MSMT LEADCHNL RA PACING THRESHOLD PULSEWIDTH: 0.4 ms
MDC IDC MSMT LEADCHNL RA SENSING INTR AMPL: 2.5 mV
MDC IDC MSMT LEADCHNL RV PACING THRESHOLD AMPLITUDE: 0.5 V
MDC IDC SESS DTM: 20150625180448
MDC IDC SET LEADCHNL RV SENSING SENSITIVITY: 0.3 mV
MDC IDC SET ZONE DETECTION INTERVAL: 320 ms
MDC IDC SET ZONE DETECTION INTERVAL: 350 ms
MDC IDC STAT BRADY AP VP PERCENT: 89.86 %
MDC IDC STAT BRADY AS VP PERCENT: 9.72 %
MDC IDC STAT BRADY RV PERCENT PACED: 99.58 %
Zone Setting Detection Interval: 360 ms
Zone Setting Detection Interval: 370 ms

## 2014-03-01 LAB — HEPATIC FUNCTION PANEL
ALT: 19 U/L (ref 0–53)
AST: 17 U/L (ref 0–37)
Albumin: 3.7 g/dL (ref 3.5–5.2)
Alkaline Phosphatase: 61 U/L (ref 39–117)
BILIRUBIN DIRECT: 0.2 mg/dL (ref 0.0–0.3)
BILIRUBIN TOTAL: 0.8 mg/dL (ref 0.2–1.2)
Total Protein: 6.4 g/dL (ref 6.0–8.3)

## 2014-03-01 LAB — BASIC METABOLIC PANEL
BUN: 17 mg/dL (ref 6–23)
CHLORIDE: 105 meq/L (ref 96–112)
CO2: 27 meq/L (ref 19–32)
CREATININE: 1 mg/dL (ref 0.4–1.5)
Calcium: 8.8 mg/dL (ref 8.4–10.5)
GFR: 78.47 mL/min (ref >60.00)
GLUCOSE: 97 mg/dL (ref 70–99)
Potassium: 4.1 mEq/L (ref 3.5–5.1)
Sodium: 137 mEq/L (ref 135–145)

## 2014-03-01 LAB — LIPID PANEL
CHOL/HDL RATIO: 3
Cholesterol: 95 mg/dL (ref 0–200)
HDL: 28.3 mg/dL — ABNORMAL LOW (ref >39.00)
LDL CALC: 50 mg/dL (ref 0–99)
NonHDL: 66.7
Triglycerides: 86 mg/dL (ref 0.0–149.0)
VLDL: 17.2 mg/dL (ref 0.0–40.0)

## 2014-03-01 NOTE — Progress Notes (Signed)
Patient Care Team: Kaleen MaskWilson Oliver Elkins, MD as PCP - General Ashlyn Ninfa Meekerenise Bruning, PA-C as Physician Assistant (Urology)   HPI  [name] is a 73 y.o. male  seen in followup for  CRT upgrade for congestive heart failure in the setting of ischemic heart disease  Initially  following ICD-CRT upgrade he did better    The patient denies chest pain, shortness of breath, nocturnal dyspnea, orthopnea or peripheral edema.  There have been no palpitations, lightheadedness or syncope.         Past Medical History  Diagnosis Date  . Dyslipidemia   . Gout   . BPH (benign prostatic hypertrophy)     SEVERE  . Coronary artery disease     STATUS POST ANTERIOR WALL MYOCARDIAL INFARCTION. HE IS STATUS POST PTCA AND STENTING OF HIS LAD. HIS INITIAL SET WAS COMPLICATED BY SUBACUTE THROMBOSIS RESULTING IN A LARGE ANTERIOR WALL MYOCARDIAL INFARCTIONAND SUDSEQUENT CHF.  He has been on coumadin since that time.  . Myocardial infarction   . Chronic kidney disease     BPH  . Arthritis     knees, back   . CHF (congestive heart failure)     EJECTION FRACTION 30-35%, biV ICD     Past Surgical History  Procedure Laterality Date  . Insert / replace / remove pacemaker      STATUS POST BIVENTRICULAR PACEMAKER/AICD  . Cardiac catheterization  2005  . Coronary angioplasty      5 stents   . Other surgical history      right ear surgery as a child   . Appendectomy    . Knee arthroscopy    . Prostatectomy  11/06/2011    Procedure: PROSTATECTOMY SUPRAPUBIC;  Surgeon: Kathi LudwigSigmund I Tannenbaum, MD;  Location: [facility];  Service: Urology;  Laterality: N/A;  Open Suprapubic Prostatectomy    Current Outpatient Prescriptions  Medication Sig Dispense Refill  . apixaban (ELIQUIS) 5 MG TABS tablet Take 1 tablet (5 mg total)  by mouth 2 (two) times  daily.  180 tablet  3  . carvedilol (COREG) 12.5 MG tablet Take 1 tablet (12.5 mg  total) by mouth 2 (two)  times daily with a meal.  180 tablet  3  . latanoprost  (XALATAN) 0.005 % ophthalmic solution       . lisinopril (PRINIVIL,ZESTRIL) 5 MG tablet Take 1 tablet by mouth  every night at bedtime  90 tablet  1  . rosuvastatin (CRESTOR) 10 MG tablet Take 1 tablet (10 mg total) by mouth daily.  30 tablet  6  . spironolactone (ALDACTONE) 25 MG tablet Take 1 tablet by mouth  daily  90 tablet  1   No current facility-administered medications for this visit.    Allergies  Allergen Reactions  . Atorvastatin Other (See Comments)    MUSCLE ACHE     Review of Systems negative except from HPI and PMH  Physical Exam BP 120/67  Pulse 84  Ht 6' (1.829 m)  Wt 222 lb (100.699 kg)  BMI 30.10 kg/m2 Well developed and well nourished in no acute distress HENT normal E scleral and icterus clear Neck Supple JVP flat; carotids brisk and full Clear to ausculation  Regular rate and rhythm, no murmurs gallops or rub Soft with active bowel sounds No clubbing cyanosis none Edema Alert and oriented, grossly normal motor and sensory function Skin Warm and Dry    Assessment and  Plan  Atrial fibrillation  Ischemic cardiomyopathy and prior stenting  Implantable defibrillator  The patient's device was interrogated.  The information was reviewed. No changes were made in the programming.    No intercurrent ventricular tachycardia.  Without symptoms of ischemia   With recurrent atrial fibrillation coming he is on apixaban

## 2014-03-01 NOTE — Patient Instructions (Signed)
Your physician wants you to follow-up in: 12 months with Dr Klein You will receive a reminder letter in the mail two months in advance. If you don't receive a letter, please call our office to schedule the follow-up appointment.    Remote monitoring is used to monitor your Pacemaker or ICD from home. This monitoring reduces the number of office visits required to check your device to one time per year. It allows us to keep an eye on the functioning of your device to ensure it is working properly. You are scheduled for a device check from home on 05/31/14. You may send your transmission at any time that day. If you have a wireless device, the transmission will be sent automatically. After your physician reviews your transmission, you will receive a postcard with your next transmission date.   

## 2014-03-26 ENCOUNTER — Telehealth: Payer: Self-pay

## 2014-03-26 ENCOUNTER — Other Ambulatory Visit: Payer: Self-pay | Admitting: Cardiovascular Disease

## 2014-03-26 NOTE — Telephone Encounter (Signed)
lisinopril (PRINIVIL,ZESTRIL) 5 MG tablet  Take 1 tablet by mouth  every night at bedtime   90 tablet   1    spironolactone (ALDACTONE) 25 MG tablet  Take 1 tablet by mouth  daily   90 tablet   1    Your physician has recommended you make the following change in your medication:  STOP ATORVASTATIN  START CRESTOR 10 MG EACH EVENING  Vesta MixerPhilip J Nahser, MD at 12/11/2013  8:28 AM

## 2014-03-28 NOTE — Telephone Encounter (Signed)
Prior authorization faxed to [facility] at [phone] and confirmation received

## 2014-03-28 NOTE — Telephone Encounter (Signed)
Spoke with representative with [facility] who is faxing form for prior authorization for Eliquis

## 2014-04-03 ENCOUNTER — Telehealth: Payer: Self-pay | Admitting: Nurse Practitioner

## 2014-04-03 ENCOUNTER — Telehealth: Payer: Self-pay | Admitting: Internal Medicine

## 2014-04-03 NOTE — Telephone Encounter (Signed)
I notified patient that Eliquis tier exceptio was denied and verified that patient will continue this medication despite the cost.  Patient verbalized understanding and agreement

## 2014-04-30 ENCOUNTER — Other Ambulatory Visit: Payer: Self-pay | Admitting: Cardiovascular Disease

## 2014-05-15 ENCOUNTER — Telehealth: Payer: Self-pay | Admitting: Internal Medicine

## 2014-05-15 NOTE — Telephone Encounter (Signed)
Follow up:    Per pt he would like information about [employer] meeting please give him a call back.

## 2014-05-15 NOTE — Telephone Encounter (Signed)
Pt inquiring about upcoming ICD support group meeting. We reviewed time/place/date. He verbalized understanding and will be attending. [name] Naval architect) notified pt will attend.

## 2014-05-31 ENCOUNTER — Encounter: Payer: Self-pay | Admitting: Internal Medicine

## 2014-05-31 ENCOUNTER — Ambulatory Visit (INDEPENDENT_AMBULATORY_CARE_PROVIDER_SITE_OTHER): Payer: Medicare Other | Admitting: *Deleted

## 2014-05-31 DIAGNOSIS — I5022 Chronic systolic (congestive) heart failure: Secondary | ICD-10-CM

## 2014-05-31 DIAGNOSIS — I255 Ischemic cardiomyopathy: Secondary | ICD-10-CM

## 2014-05-31 DIAGNOSIS — I2589 Other forms of chronic ischemic heart disease: Secondary | ICD-10-CM

## 2014-05-31 NOTE — Progress Notes (Signed)
Remote ICD transmission.   

## 2014-06-04 ENCOUNTER — Telehealth: Payer: Self-pay | Admitting: *Deleted

## 2014-06-04 LAB — MDC_IDC_ENUM_SESS_TYPE_REMOTE
Battery Voltage: 2.63 V
Brady Statistic AP VP Percent: 92.34 %
Brady Statistic AS VP Percent: 7.57 %
Brady Statistic AS VS Percent: 0.06 %
Brady Statistic RV Percent Paced: 99.91 %
HighPow Impedance: 51 Ohm
HighPow Impedance: 65 Ohm
Lead Channel Impedance Value: 532 Ohm
Lead Channel Impedance Value: 551 Ohm
Lead Channel Impedance Value: 741 Ohm
Lead Channel Sensing Intrinsic Amplitude: 11.625 mV
Lead Channel Setting Pacing Amplitude: 2.5 V
Lead Channel Setting Pacing Amplitude: 2.5 V
Lead Channel Setting Pacing Pulse Width: 0.4 ms
Lead Channel Setting Pacing Pulse Width: 0.4 ms
MDC IDC MSMT LEADCHNL LV IMPEDANCE VALUE: 1064 Ohm
MDC IDC MSMT LEADCHNL LV PACING THRESHOLD AMPLITUDE: 1.25 V
MDC IDC MSMT LEADCHNL LV PACING THRESHOLD PULSEWIDTH: 0.8 ms
MDC IDC MSMT LEADCHNL RA SENSING INTR AMPL: 2.375 mV
MDC IDC MSMT LEADCHNL RA SENSING INTR AMPL: 2.375 mV
MDC IDC MSMT LEADCHNL RV IMPEDANCE VALUE: 475 Ohm
MDC IDC MSMT LEADCHNL RV SENSING INTR AMPL: 11.625 mV
MDC IDC SESS DTM: 20150924134250
MDC IDC SET LEADCHNL RA PACING AMPLITUDE: 2 V
MDC IDC SET LEADCHNL RV SENSING SENSITIVITY: 0.3 mV
MDC IDC SET ZONE DETECTION INTERVAL: 320 ms
MDC IDC STAT BRADY AP VS PERCENT: 0.03 %
MDC IDC STAT BRADY RA PERCENT PACED: 92.37 %
Zone Setting Detection Interval: 350 ms
Zone Setting Detection Interval: 360 ms
Zone Setting Detection Interval: 370 ms

## 2014-06-04 NOTE — Telephone Encounter (Signed)
LMOVM w/ my direct # to remind pt about ERI alert tone.

## 2014-06-05 NOTE — Telephone Encounter (Signed)
Pt returned my msg. Pt aware to call clinic when alert tone occurs due to ERI. Pt understands not to call an ambulance. Pt also understands if alert tone notifies him after hours or on the weekend, it is okay to call the office on the next available work day.

## 2014-06-11 ENCOUNTER — Encounter: Payer: Self-pay | Admitting: Cardiology

## 2014-07-02 ENCOUNTER — Encounter: Payer: Self-pay | Admitting: Internal Medicine

## 2014-07-02 ENCOUNTER — Ambulatory Visit (INDEPENDENT_AMBULATORY_CARE_PROVIDER_SITE_OTHER): Payer: Medicare Other | Admitting: *Deleted

## 2014-07-02 DIAGNOSIS — I469 Cardiac arrest, cause unspecified: Secondary | ICD-10-CM

## 2014-07-02 NOTE — Progress Notes (Signed)
Remote ICD transmission.   

## 2014-07-04 LAB — MDC_IDC_ENUM_SESS_TYPE_REMOTE
Battery Voltage: 2.63 V
Brady Statistic AP VP Percent: 86.49 %
Brady Statistic AS VS Percent: 0.11 %
Brady Statistic RA Percent Paced: 86.54 %
Brady Statistic RV Percent Paced: 99.85 %
HIGH POWER IMPEDANCE MEASURED VALUE: 63 Ohm
HighPow Impedance: 49 Ohm
Lead Channel Impedance Value: 475 Ohm
Lead Channel Impedance Value: 532 Ohm
Lead Channel Pacing Threshold Pulse Width: 0.8 ms
Lead Channel Sensing Intrinsic Amplitude: 2.5 mV
Lead Channel Sensing Intrinsic Amplitude: 2.5 mV
Lead Channel Sensing Intrinsic Amplitude: 23.75 mV
Lead Channel Sensing Intrinsic Amplitude: 23.75 mV
Lead Channel Setting Pacing Amplitude: 2 V
Lead Channel Setting Pacing Amplitude: 2.5 V
Lead Channel Setting Pacing Pulse Width: 0.4 ms
Lead Channel Setting Sensing Sensitivity: 0.3 mV
MDC IDC MSMT LEADCHNL LV IMPEDANCE VALUE: 1045 Ohm
MDC IDC MSMT LEADCHNL LV IMPEDANCE VALUE: 741 Ohm
MDC IDC MSMT LEADCHNL LV PACING THRESHOLD AMPLITUDE: 1.25 V
MDC IDC MSMT LEADCHNL RA IMPEDANCE VALUE: 475 Ohm
MDC IDC SESS DTM: 20151026182326
MDC IDC SET LEADCHNL LV PACING PULSEWIDTH: 0.4 ms
MDC IDC SET LEADCHNL RV PACING AMPLITUDE: 2.5 V
MDC IDC SET ZONE DETECTION INTERVAL: 350 ms
MDC IDC SET ZONE DETECTION INTERVAL: 370 ms
MDC IDC STAT BRADY AP VS PERCENT: 0.05 %
MDC IDC STAT BRADY AS VP PERCENT: 13.35 %
Zone Setting Detection Interval: 320 ms
Zone Setting Detection Interval: 360 ms

## 2014-07-11 ENCOUNTER — Encounter: Payer: Self-pay | Admitting: Cardiology

## 2014-07-27 ENCOUNTER — Encounter: Payer: Self-pay | Admitting: Cardiology

## 2014-07-29 ENCOUNTER — Other Ambulatory Visit: Payer: Self-pay | Admitting: Cardiovascular Disease

## 2014-08-06 ENCOUNTER — Encounter: Payer: Self-pay | Admitting: Internal Medicine

## 2014-08-06 ENCOUNTER — Ambulatory Visit (INDEPENDENT_AMBULATORY_CARE_PROVIDER_SITE_OTHER): Payer: Medicare Other | Admitting: *Deleted

## 2014-08-06 DIAGNOSIS — I255 Ischemic cardiomyopathy: Secondary | ICD-10-CM

## 2014-08-06 LAB — MDC_IDC_ENUM_SESS_TYPE_REMOTE
Battery Voltage: 2.62 V
Brady Statistic AS VS Percent: 0.01 %
HIGH POWER IMPEDANCE MEASURED VALUE: 45 Ohm
HIGH POWER IMPEDANCE MEASURED VALUE: 58 Ohm
Lead Channel Impedance Value: 418 Ohm
Lead Channel Impedance Value: 532 Ohm
Lead Channel Impedance Value: 646 Ohm
Lead Channel Pacing Threshold Pulse Width: 0.8 ms
Lead Channel Sensing Intrinsic Amplitude: 2.25 mV
Lead Channel Sensing Intrinsic Amplitude: 2.25 mV
Lead Channel Sensing Intrinsic Amplitude: 23.75 mV
Lead Channel Setting Pacing Amplitude: 2 V
Lead Channel Setting Pacing Amplitude: 2.5 V
Lead Channel Setting Pacing Pulse Width: 0.4 ms
Lead Channel Setting Pacing Pulse Width: 0.4 ms
MDC IDC MSMT LEADCHNL LV IMPEDANCE VALUE: 1064 Ohm
MDC IDC MSMT LEADCHNL LV PACING THRESHOLD AMPLITUDE: 1.25 V
MDC IDC MSMT LEADCHNL RA IMPEDANCE VALUE: 494 Ohm
MDC IDC MSMT LEADCHNL RV SENSING INTR AMPL: 23.75 mV
MDC IDC SESS DTM: 20151130154437
MDC IDC SET LEADCHNL RV PACING AMPLITUDE: 2.5 V
MDC IDC SET LEADCHNL RV SENSING SENSITIVITY: 0.3 mV
MDC IDC SET ZONE DETECTION INTERVAL: 370 ms
MDC IDC STAT BRADY AP VP PERCENT: 94.08 %
MDC IDC STAT BRADY AP VS PERCENT: 0.04 %
MDC IDC STAT BRADY AS VP PERCENT: 5.87 %
MDC IDC STAT BRADY RA PERCENT PACED: 94.13 %
MDC IDC STAT BRADY RV PERCENT PACED: 99.95 %
Zone Setting Detection Interval: 320 ms
Zone Setting Detection Interval: 350 ms
Zone Setting Detection Interval: 360 ms

## 2014-08-07 NOTE — Progress Notes (Signed)
Remote ICD transmission.   

## 2014-08-13 ENCOUNTER — Encounter: Payer: Self-pay | Admitting: Cardiology

## 2014-08-27 ENCOUNTER — Encounter: Payer: Self-pay | Admitting: Cardiology

## 2014-09-05 ENCOUNTER — Ambulatory Visit (INDEPENDENT_AMBULATORY_CARE_PROVIDER_SITE_OTHER): Payer: Medicare Other | Admitting: *Deleted

## 2014-09-05 DIAGNOSIS — I255 Ischemic cardiomyopathy: Secondary | ICD-10-CM

## 2014-09-05 DIAGNOSIS — I469 Cardiac arrest, cause unspecified: Secondary | ICD-10-CM

## 2014-09-05 DIAGNOSIS — I5022 Chronic systolic (congestive) heart failure: Secondary | ICD-10-CM

## 2014-09-05 NOTE — Progress Notes (Signed)
Remote ICD transmission.   

## 2014-09-06 LAB — MDC_IDC_ENUM_SESS_TYPE_REMOTE
Brady Statistic AP VP Percent: 89.63 %
Brady Statistic AP VS Percent: 0.05 %
Brady Statistic AS VP Percent: 9.42 %
Brady Statistic AS VS Percent: 0.9 %
Brady Statistic RA Percent Paced: 89.68 %
Date Time Interrogation Session: 20151230142346
HIGH POWER IMPEDANCE MEASURED VALUE: 61 Ohm
HighPow Impedance: 49 Ohm
Lead Channel Impedance Value: 1007 Ohm
Lead Channel Impedance Value: 475 Ohm
Lead Channel Impedance Value: 494 Ohm
Lead Channel Impedance Value: 532 Ohm
Lead Channel Sensing Intrinsic Amplitude: 17.375 mV
Lead Channel Setting Pacing Amplitude: 2.5 V
Lead Channel Setting Pacing Amplitude: 2.5 V
Lead Channel Setting Pacing Pulse Width: 0.4 ms
Lead Channel Setting Sensing Sensitivity: 0.3 mV
MDC IDC MSMT BATTERY VOLTAGE: 2.62 V
MDC IDC MSMT LEADCHNL LV IMPEDANCE VALUE: 646 Ohm
MDC IDC MSMT LEADCHNL LV PACING THRESHOLD AMPLITUDE: 1.25 V
MDC IDC MSMT LEADCHNL LV PACING THRESHOLD PULSEWIDTH: 0.8 ms
MDC IDC MSMT LEADCHNL RA SENSING INTR AMPL: 2.375 mV
MDC IDC SET LEADCHNL RA PACING AMPLITUDE: 2 V
MDC IDC SET LEADCHNL RV PACING PULSEWIDTH: 0.4 ms
MDC IDC STAT BRADY RV PERCENT PACED: 99.05 %
Zone Setting Detection Interval: 320 ms
Zone Setting Detection Interval: 350 ms
Zone Setting Detection Interval: 360 ms
Zone Setting Detection Interval: 370 ms

## 2014-09-21 ENCOUNTER — Encounter: Payer: Self-pay | Admitting: Cardiology

## 2014-09-25 ENCOUNTER — Telehealth: Payer: Self-pay | Admitting: Internal Medicine

## 2014-09-25 NOTE — Telephone Encounter (Signed)
LMOVM for pt to return call 

## 2014-09-25 NOTE — Telephone Encounter (Signed)
Spoke w/ pt and informed him to call tech service.

## 2014-09-25 NOTE — Telephone Encounter (Signed)
New Msg        Pt calling states all of the lights on his device are blinking.     Please contact pt.

## 2014-09-26 ENCOUNTER — Encounter: Payer: Self-pay | Admitting: Internal Medicine

## 2014-10-08 ENCOUNTER — Ambulatory Visit (INDEPENDENT_AMBULATORY_CARE_PROVIDER_SITE_OTHER): Payer: Medicare Other | Admitting: *Deleted

## 2014-10-08 DIAGNOSIS — I469 Cardiac arrest, cause unspecified: Secondary | ICD-10-CM

## 2014-10-08 LAB — MDC_IDC_ENUM_SESS_TYPE_REMOTE
Battery Voltage: 2.61 V
Brady Statistic AP VP Percent: 96.18 %
Brady Statistic AS VP Percent: 3.79 %
Brady Statistic AS VS Percent: 0 %
Brady Statistic RA Percent Paced: 96.21 %
Brady Statistic RV Percent Paced: 99.97 %
HighPow Impedance: 49 Ohm
HighPow Impedance: 65 Ohm
Lead Channel Impedance Value: 1159 Ohm
Lead Channel Impedance Value: 532 Ohm
Lead Channel Impedance Value: 779 Ohm
Lead Channel Pacing Threshold Amplitude: 1.25 V
Lead Channel Sensing Intrinsic Amplitude: 17.375 mV
Lead Channel Sensing Intrinsic Amplitude: 17.375 mV
Lead Channel Sensing Intrinsic Amplitude: 2.375 mV
Lead Channel Sensing Intrinsic Amplitude: 2.375 mV
Lead Channel Setting Pacing Amplitude: 2.5 V
Lead Channel Setting Pacing Pulse Width: 0.4 ms
MDC IDC MSMT LEADCHNL LV PACING THRESHOLD PULSEWIDTH: 0.8 ms
MDC IDC MSMT LEADCHNL RA IMPEDANCE VALUE: 494 Ohm
MDC IDC MSMT LEADCHNL RV IMPEDANCE VALUE: 551 Ohm
MDC IDC SESS DTM: 20160201164520
MDC IDC SET LEADCHNL LV PACING AMPLITUDE: 2.5 V
MDC IDC SET LEADCHNL LV PACING PULSEWIDTH: 0.4 ms
MDC IDC SET LEADCHNL RA PACING AMPLITUDE: 2 V
MDC IDC SET LEADCHNL RV SENSING SENSITIVITY: 0.3 mV
MDC IDC SET ZONE DETECTION INTERVAL: 350 ms
MDC IDC STAT BRADY AP VS PERCENT: 0.03 %
Zone Setting Detection Interval: 320 ms
Zone Setting Detection Interval: 360 ms
Zone Setting Detection Interval: 370 ms

## 2014-10-08 NOTE — Progress Notes (Signed)
Remote ICD transmission.   

## 2014-10-09 ENCOUNTER — Encounter: Payer: Self-pay | Admitting: Cardiology

## 2014-10-16 ENCOUNTER — Encounter: Payer: Self-pay | Admitting: Cardiology

## 2014-10-19 ENCOUNTER — Encounter: Payer: Self-pay | Admitting: Internal Medicine

## 2014-10-27 ENCOUNTER — Encounter: Payer: Self-pay | Admitting: Internal Medicine

## 2014-10-29 ENCOUNTER — Telehealth: Payer: Self-pay | Admitting: Cardiology

## 2014-10-29 NOTE — Telephone Encounter (Signed)
LMOVM for pt to return call 

## 2014-10-29 NOTE — Telephone Encounter (Signed)
Spoke w/ pt and informed him that he has reached ERI and that a scheduler would be in contact to schedule him an appt. Pt verbalized understanding.

## 2014-10-30 ENCOUNTER — Encounter: Payer: Self-pay | Admitting: Cardiology

## 2014-11-05 ENCOUNTER — Telehealth: Payer: Self-pay | Admitting: Internal Medicine

## 2014-11-05 ENCOUNTER — Ambulatory Visit (INDEPENDENT_AMBULATORY_CARE_PROVIDER_SITE_OTHER): Payer: Medicare Other | Admitting: Internal Medicine

## 2014-11-05 ENCOUNTER — Encounter: Payer: Self-pay | Admitting: Internal Medicine

## 2014-11-05 ENCOUNTER — Encounter: Payer: Self-pay | Admitting: *Deleted

## 2014-11-05 VITALS — BP 86/46 | HR 60 | Ht 72.0 in | Wt 223.4 lb

## 2014-11-05 DIAGNOSIS — I255 Ischemic cardiomyopathy: Secondary | ICD-10-CM

## 2014-11-05 DIAGNOSIS — Z4502 Encounter for adjustment and management of automatic implantable cardiac defibrillator: Secondary | ICD-10-CM

## 2014-11-05 DIAGNOSIS — Z01812 Encounter for preprocedural laboratory examination: Secondary | ICD-10-CM

## 2014-11-05 DIAGNOSIS — I10 Essential (primary) hypertension: Secondary | ICD-10-CM

## 2014-11-05 DIAGNOSIS — I480 Paroxysmal atrial fibrillation: Secondary | ICD-10-CM

## 2014-11-05 LAB — CBC WITH DIFFERENTIAL/PLATELET
Basophils Absolute: 0 10*3/uL (ref 0.0–0.1)
Basophils Relative: 0.4 % (ref 0.0–3.0)
EOS PCT: 2.2 % (ref 0.0–5.0)
Eosinophils Absolute: 0.1 10*3/uL (ref 0.0–0.7)
HEMATOCRIT: 41.8 % (ref 39.0–52.0)
HEMOGLOBIN: 14.1 g/dL (ref 13.0–17.0)
LYMPHS ABS: 1.6 10*3/uL (ref 0.7–4.0)
Lymphocytes Relative: 23.6 % (ref 12.0–46.0)
MCHC: 33.6 g/dL (ref 30.0–36.0)
MCV: 92.7 fl (ref 78.0–100.0)
MONOS PCT: 7.8 % (ref 3.0–12.0)
Monocytes Absolute: 0.5 10*3/uL (ref 0.1–1.0)
NEUTROS ABS: 4.4 10*3/uL (ref 1.4–7.7)
Neutrophils Relative %: 66 % (ref 43.0–77.0)
Platelets: 217 10*3/uL (ref 150.0–400.0)
RBC: 4.51 Mil/uL (ref 4.22–5.81)
RDW: 12.7 % (ref 11.5–15.5)
WBC: 6.7 10*3/uL (ref 4.0–10.5)

## 2014-11-05 LAB — BASIC METABOLIC PANEL
BUN: 19 mg/dL (ref 6–23)
CHLORIDE: 105 meq/L (ref 96–112)
CO2: 29 meq/L (ref 19–32)
Calcium: 9 mg/dL (ref 8.4–10.5)
Creatinine, Ser: 1.02 mg/dL (ref 0.40–1.50)
GFR: 75.67 mL/min (ref >60.00)
Glucose, Bld: 128 mg/dL — ABNORMAL HIGH (ref 70–99)
Potassium: 4.1 mEq/L (ref 3.5–5.1)
Sodium: 137 mEq/L (ref 135–145)

## 2014-11-05 MED ORDER — CARVEDILOL 12.5 MG PO TABS: 6.2500 mg | ORAL_TABLET | Freq: Two times a day (BID) | ORAL | Status: DC

## 2014-11-05 MED ORDER — SPIRONOLACTONE 25 MG PO TABS: 12.5000 mg | ORAL_TABLET | Freq: Every day | ORAL | Status: DC

## 2014-11-05 NOTE — Patient Instructions (Addendum)
Your physician has recommended you make the following change in your medication:  1) DECREASE Carvedilol to 6.25 mg twice a day 2) DECREASE Aldactone to 12.5 mg once daily  Pre procedure labs today: BMET, CBCD  Your physician has recommended that you have a defibrillator generator change. Please see the instruction sheet given to you today for more information.  Your wound check is scheduled for 11/19/14 at 9:30 a.m. at [address]   Thank you for choosing [facility]!!

## 2014-11-05 NOTE — Telephone Encounter (Signed)
New Message         Pt calling stating that he received a phone call from our office about his upcoming procedure but was not able to understand the message. Please call back and advise.

## 2014-11-05 NOTE — Progress Notes (Addendum)
Patient Care Team: Kaleen MaskWilson Oliver Elkins, MD as PCP - General Ashlyn Ninfa Meekerenise Bruning, PA-C as Physician Assistant (Urology)   HPI  [name] is a 74 y.o. male  seen in followup for  CRT upgrade for congestive heart failure in the setting of ischemic heart disease     The patient denies chest pain, shortness of breath, nocturnal dyspnea, orthopnea or peripheral edema.  There have been no palpitations, lightheadedness or syncope. He has over felt weak and he is lacking energy. He has not noted peripheral edema        Past Medical History  Diagnosis Date  . Dyslipidemia   . Gout   . BPH (benign prostatic hypertrophy)     SEVERE  . Coronary artery disease     STATUS POST ANTERIOR WALL MYOCARDIAL INFARCTION. HE IS STATUS POST PTCA AND STENTING OF HIS LAD. HIS INITIAL SET WAS COMPLICATED BY SUBACUTE THROMBOSIS RESULTING IN A LARGE ANTERIOR WALL MYOCARDIAL INFARCTIONAND SUDSEQUENT CHF.  He has been on coumadin since that time.  . Myocardial infarction   . Chronic kidney disease     BPH  . Arthritis     knees, back   . CHF (congestive heart failure)     EJECTION FRACTION 30-35%, biV ICD     Past Surgical History  Procedure Laterality Date  . Insert / replace / remove pacemaker      STATUS POST BIVENTRICULAR PACEMAKER/AICD  . Cardiac catheterization  2005  . Coronary angioplasty      5 stents   . Other surgical history      right ear surgery as a child   . Appendectomy    . Knee arthroscopy    . Prostatectomy  11/06/2011    Procedure: PROSTATECTOMY SUPRAPUBIC;  Surgeon: Kathi LudwigSigmund I Tannenbaum, MD;  Location: [facility];  Service: Urology;  Laterality: N/A;  Open Suprapubic Prostatectomy    Current Outpatient Prescriptions  Medication Sig Dispense Refill  . apixaban (ELIQUIS) 5 MG TABS tablet Take 1 tablet (5 mg total)  by mouth 2 (two) times  daily. 180 tablet 3  . carvedilol (COREG) 12.5 MG tablet Take 1 tablet (12.5 mg  total) by mouth 2 (two)  times daily with a meal. 180  tablet 3  . CRESTOR 10 MG tablet Take 1 tablet by mouth  daily 90 tablet 3  . latanoprost (XALATAN) 0.005 % ophthalmic solution     . lisinopril (PRINIVIL,ZESTRIL) 5 MG tablet Take 1 tablet by mouth  every night at bedtime 90 tablet 1  . spironolactone (ALDACTONE) 25 MG tablet Take 1 tablet by mouth  daily 90 tablet 1   No current facility-administered medications for this visit.    Allergies  Allergen Reactions  . Atorvastatin Other (See Comments)    MUSCLE ACHE     Review of Systems negative except from HPI and PMH  Physical Exam BP 86/46 mmHg  Pulse 60  Ht 6' (1.829 m)  Wt 223 lb 6.4 oz (101.334 kg)  BMI 30.29 kg/m2 Well developed and well nourished in no acute distress HENT normal E scleral and icterus clear Neck Supple JVP flat; carotids brisk and full Clear to ausculation  Regular rate and rhythm, no murmurs gallops or rub Soft with active bowel sounds No clubbing cyanosis none Edema Alert and oriented, grossly normal motor and sensory function Skin Warm and Dry    Assessment and  Plan  Atrial fibrillation  Ischemic cardiomyopathy and prior stenting  Implantable defibrillator The patient's device was interrogated.  The information was reviewed. No changes were made in the programming.    No intercurrent ventricular tachycardia.  Without symptoms of ischemia  He is having weakness which may be attributable to his hypotension; we will decrease his carvedilol as well as his Aldactone. Laboratories were last checked 6/15. We will work on following him up every 3 months.  There is no significant intercurrent ventricular or atrial arrhythmias  Device at ERI needs replacement  We have reviewed the benefits and risks of generator replacement.  These include but are not limited to lead fracture and infection.  The patient understands, agrees and is willing to proceed.    We will using a antimicrobial pouch

## 2014-11-05 NOTE — Telephone Encounter (Signed)
Gave patient number to the surgery center.  He states they called and left a message but he could not hear the phone number to call back.   Thankful for help

## 2014-11-06 DIAGNOSIS — I4891 Unspecified atrial fibrillation: Secondary | ICD-10-CM | POA: Diagnosis not present

## 2014-11-06 DIAGNOSIS — M13861 Other specified arthritis, right knee: Secondary | ICD-10-CM | POA: Diagnosis not present

## 2014-11-06 DIAGNOSIS — E785 Hyperlipidemia, unspecified: Secondary | ICD-10-CM | POA: Diagnosis not present

## 2014-11-06 DIAGNOSIS — M4690 Unspecified inflammatory spondylopathy, site unspecified: Secondary | ICD-10-CM | POA: Diagnosis not present

## 2014-11-06 DIAGNOSIS — Z7901 Long term (current) use of anticoagulants: Secondary | ICD-10-CM | POA: Diagnosis not present

## 2014-11-06 DIAGNOSIS — N400 Enlarged prostate without lower urinary tract symptoms: Secondary | ICD-10-CM | POA: Diagnosis not present

## 2014-11-06 DIAGNOSIS — I255 Ischemic cardiomyopathy: Secondary | ICD-10-CM | POA: Diagnosis not present

## 2014-11-06 DIAGNOSIS — M109 Gout, unspecified: Secondary | ICD-10-CM | POA: Diagnosis not present

## 2014-11-06 DIAGNOSIS — Z79899 Other long term (current) drug therapy: Secondary | ICD-10-CM | POA: Diagnosis not present

## 2014-11-06 DIAGNOSIS — I509 Heart failure, unspecified: Secondary | ICD-10-CM | POA: Diagnosis not present

## 2014-11-06 DIAGNOSIS — I2510 Atherosclerotic heart disease of native coronary artery without angina pectoris: Secondary | ICD-10-CM | POA: Diagnosis not present

## 2014-11-06 DIAGNOSIS — M13862 Other specified arthritis, left knee: Secondary | ICD-10-CM | POA: Diagnosis not present

## 2014-11-06 DIAGNOSIS — I252 Old myocardial infarction: Secondary | ICD-10-CM | POA: Diagnosis not present

## 2014-11-06 DIAGNOSIS — Z4502 Encounter for adjustment and management of automatic implantable cardiac defibrillator: Secondary | ICD-10-CM | POA: Diagnosis present

## 2014-11-06 DIAGNOSIS — N189 Chronic kidney disease, unspecified: Secondary | ICD-10-CM | POA: Diagnosis not present

## 2014-11-06 MED ORDER — CEFAZOLIN SODIUM-DEXTROSE 2-3 GM-% IV SOLR: 2.0000 g | INTRAVENOUS | Status: AC

## 2014-11-06 MED ORDER — SODIUM CHLORIDE 0.9 % IR SOLN
80.0000 mg | Status: AC
  Filled 2014-11-06: qty 2

## 2014-11-06 MED ORDER — SODIUM CHLORIDE 0.9 % IV SOLN
INTRAVENOUS | Status: DC
  Administered 2014-11-07: 11:00:00 via INTRAVENOUS

## 2014-11-07 ENCOUNTER — Encounter (HOSPITAL_COMMUNITY): Payer: Self-pay | Admitting: Internal Medicine

## 2014-11-07 ENCOUNTER — Ambulatory Visit (HOSPITAL_COMMUNITY)
Admission: RE | Admit: 2014-11-07 | Discharge: 2014-11-07 | Disposition: A | Discharge status: Home / Self Care | Payer: Medicare Other | Source: Ambulatory Visit | Attending: Internal Medicine | Admitting: Internal Medicine

## 2014-11-07 ENCOUNTER — Encounter (HOSPITAL_COMMUNITY)
Admission: RE | Disposition: A | Discharge status: Home / Self Care | Payer: Medicare Other | Source: Ambulatory Visit | Attending: Internal Medicine

## 2014-11-07 DIAGNOSIS — I509 Heart failure, unspecified: Secondary | ICD-10-CM | POA: Diagnosis not present

## 2014-11-07 DIAGNOSIS — Z79899 Other long term (current) drug therapy: Secondary | ICD-10-CM | POA: Insufficient documentation

## 2014-11-07 DIAGNOSIS — I2510 Atherosclerotic heart disease of native coronary artery without angina pectoris: Secondary | ICD-10-CM | POA: Insufficient documentation

## 2014-11-07 DIAGNOSIS — Z7901 Long term (current) use of anticoagulants: Secondary | ICD-10-CM | POA: Insufficient documentation

## 2014-11-07 DIAGNOSIS — I255 Ischemic cardiomyopathy: Secondary | ICD-10-CM | POA: Diagnosis not present

## 2014-11-07 DIAGNOSIS — M13862 Other specified arthritis, left knee: Secondary | ICD-10-CM | POA: Insufficient documentation

## 2014-11-07 DIAGNOSIS — I4891 Unspecified atrial fibrillation: Secondary | ICD-10-CM | POA: Diagnosis not present

## 2014-11-07 DIAGNOSIS — Z4502 Encounter for adjustment and management of automatic implantable cardiac defibrillator: Secondary | ICD-10-CM

## 2014-11-07 DIAGNOSIS — E785 Hyperlipidemia, unspecified: Secondary | ICD-10-CM | POA: Insufficient documentation

## 2014-11-07 DIAGNOSIS — I480 Paroxysmal atrial fibrillation: Secondary | ICD-10-CM

## 2014-11-07 DIAGNOSIS — N189 Chronic kidney disease, unspecified: Secondary | ICD-10-CM | POA: Insufficient documentation

## 2014-11-07 DIAGNOSIS — N400 Enlarged prostate without lower urinary tract symptoms: Secondary | ICD-10-CM | POA: Insufficient documentation

## 2014-11-07 DIAGNOSIS — I5022 Chronic systolic (congestive) heart failure: Secondary | ICD-10-CM | POA: Diagnosis present

## 2014-11-07 DIAGNOSIS — I5042 Chronic combined systolic (congestive) and diastolic (congestive) heart failure: Secondary | ICD-10-CM | POA: Diagnosis present

## 2014-11-07 DIAGNOSIS — M109 Gout, unspecified: Secondary | ICD-10-CM | POA: Insufficient documentation

## 2014-11-07 DIAGNOSIS — M13861 Other specified arthritis, right knee: Secondary | ICD-10-CM | POA: Insufficient documentation

## 2014-11-07 DIAGNOSIS — I252 Old myocardial infarction: Secondary | ICD-10-CM | POA: Insufficient documentation

## 2014-11-07 DIAGNOSIS — M4690 Unspecified inflammatory spondylopathy, site unspecified: Secondary | ICD-10-CM | POA: Insufficient documentation

## 2014-11-07 HISTORY — PX: BI-VENTRICULAR IMPLANTABLE CARDIOVERTER DEFIBRILLATOR UPGRADE: SHX5461

## 2014-11-07 LAB — SURGICAL PCR SCREEN
MRSA, PCR: NEGATIVE
Staphylococcus aureus: NEGATIVE

## 2014-11-07 SURGERY — BI-VENTRICULAR IMPLANTABLE CARDIOVERTER DEFIBRILLATOR UPGRADE

## 2014-11-07 MED ORDER — CEFAZOLIN SODIUM-DEXTROSE 2-3 GM-% IV SOLR
INTRAVENOUS | Status: AC
  Filled 2014-11-07: qty 50

## 2014-11-07 MED ORDER — MUPIROCIN 2 % EX OINT
1.0000 "application " | TOPICAL_OINTMENT | Freq: Once | CUTANEOUS | Status: AC
  Administered 2014-11-07: 1 via TOPICAL

## 2014-11-07 MED ORDER — ONDANSETRON HCL 4 MG/2ML IJ SOLN: 4.0000 mg | Freq: Four times a day (QID) | INTRAMUSCULAR | Status: DC | PRN

## 2014-11-07 MED ORDER — FENTANYL CITRATE 0.05 MG/ML IJ SOLN
INTRAMUSCULAR | Status: AC
  Filled 2014-11-07: qty 2

## 2014-11-07 MED ORDER — ACETAMINOPHEN 325 MG PO TABS
325.0000 mg | ORAL_TABLET | ORAL | Status: DC | PRN
  Filled 2014-11-07: qty 2

## 2014-11-07 MED ORDER — CHLORHEXIDINE GLUCONATE 4 % EX LIQD: 60.0000 mL | Freq: Once | CUTANEOUS | Status: DC

## 2014-11-07 MED ORDER — LIDOCAINE HCL (PF) 1 % IJ SOLN
INTRAMUSCULAR | Status: AC
  Filled 2014-11-07: qty 30

## 2014-11-07 MED ORDER — MUPIROCIN 2 % EX OINT
TOPICAL_OINTMENT | CUTANEOUS | Status: AC
  Filled 2014-11-07: qty 22

## 2014-11-07 MED ORDER — MIDAZOLAM HCL 5 MG/5ML IJ SOLN
INTRAMUSCULAR | Status: AC
  Filled 2014-11-07: qty 5

## 2014-11-07 MED ORDER — SODIUM CHLORIDE 0.9 % IV SOLN: INTRAVENOUS | Status: AC

## 2014-11-07 NOTE — H&P (View-Only) (Signed)
Patient Care Team: Kaleen MaskWilson Oliver Elkins, MD as PCP - General Ashlyn Ninfa Meekerenise Bruning, PA-C as Physician Assistant (Urology)   HPI  [name] is a 74 y.o. male  seen in followup for  CRT upgrade for congestive heart failure in the setting of ischemic heart disease     The patient denies chest pain, shortness of breath, nocturnal dyspnea, orthopnea or peripheral edema.  There have been no palpitations, lightheadedness or syncope. He has over felt weak and he is lacking energy. He has not noted peripheral edema        Past Medical History  Diagnosis Date  . Dyslipidemia   . Gout   . BPH (benign prostatic hypertrophy)     SEVERE  . Coronary artery disease     STATUS POST ANTERIOR WALL MYOCARDIAL INFARCTION. HE IS STATUS POST PTCA AND STENTING OF HIS LAD. HIS INITIAL SET WAS COMPLICATED BY SUBACUTE THROMBOSIS RESULTING IN A LARGE ANTERIOR WALL MYOCARDIAL INFARCTIONAND SUDSEQUENT CHF.  He has been on coumadin since that time.  . Myocardial infarction   . Chronic kidney disease     BPH  . Arthritis     knees, back   . CHF (congestive heart failure)     EJECTION FRACTION 30-35%, biV ICD     Past Surgical History  Procedure Laterality Date  . Insert / replace / remove pacemaker      STATUS POST BIVENTRICULAR PACEMAKER/AICD  . Cardiac catheterization  2005  . Coronary angioplasty      5 stents   . Other surgical history      right ear surgery as a child   . Appendectomy    . Knee arthroscopy    . Prostatectomy  11/06/2011    Procedure: PROSTATECTOMY SUPRAPUBIC;  Surgeon: Kathi LudwigSigmund I Tannenbaum, MD;  Location: [facility];  Service: Urology;  Laterality: N/A;  Open Suprapubic Prostatectomy    Current Outpatient Prescriptions  Medication Sig Dispense Refill  . apixaban (ELIQUIS) 5 MG TABS tablet Take 1 tablet (5 mg total)  by mouth 2 (two) times  daily. 180 tablet 3  . carvedilol (COREG) 12.5 MG tablet Take 1 tablet (12.5 mg  total) by mouth 2 (two)  times daily with a meal. 180  tablet 3  . CRESTOR 10 MG tablet Take 1 tablet by mouth  daily 90 tablet 3  . latanoprost (XALATAN) 0.005 % ophthalmic solution     . lisinopril (PRINIVIL,ZESTRIL) 5 MG tablet Take 1 tablet by mouth  every night at bedtime 90 tablet 1  . spironolactone (ALDACTONE) 25 MG tablet Take 1 tablet by mouth  daily 90 tablet 1   No current facility-administered medications for this visit.    Allergies  Allergen Reactions  . Atorvastatin Other (See Comments)    MUSCLE ACHE     Review of Systems negative except from HPI and PMH  Physical Exam BP 86/46 mmHg  Pulse 60  Ht 6' (1.829 m)  Wt 223 lb 6.4 oz (101.334 kg)  BMI 30.29 kg/m2 Well developed and well nourished in no acute distress HENT normal E scleral and icterus clear Neck Supple JVP flat; carotids brisk and full Clear to ausculation  Regular rate and rhythm, no murmurs gallops or rub Soft with active bowel sounds No clubbing cyanosis none Edema Alert and oriented, grossly normal motor and sensory function Skin Warm and Dry    Assessment and  Plan  Atrial fibrillation  Ischemic cardiomyopathy and prior stenting  Implantable defibrillator The patient's device was interrogated.  The information was reviewed. No changes were made in the programming.    No intercurrent ventricular tachycardia.  Without symptoms of ischemia  He is having weakness which may be attributable to his hypotension; we will decrease his carvedilol as well as his Aldactone. Laboratories were last checked 6/15. We will work on following him up every 3 months.  There is no significant intercurrent ventricular or atrial arrhythmias  We have reviewed the benefits and risks of generator replacement.  These include but are not limited to lead fracture and infection.  The patient understands, agrees and is willing to proceed.    We will using a antimicrobial pouch

## 2014-11-07 NOTE — Interval H&P Note (Signed)
History and Physical Interval Note:  11/07/2014 11:46 AM  [name]  has presented today for surgery, with the diagnosis of eri  The various methods of treatment have been discussed with the patient and family. After consideration of risks, benefits and other options for treatment, the patient has consented to  Procedure(s): BI-VENTRICULAR IMPLANTABLE CARDIOVERTER DEFIBRILLATOR UPGRADE (N/A) as a surgical intervention .  The patient's history has been reviewed, patient examined, no change in status, stable for surgery.  I have reviewed the patient's chart and labs.  Questions were answered to the patient's satisfaction.     Sherryl MangesSteven Tamaria Dunleavy

## 2014-11-07 NOTE — Interval H&P Note (Signed)
ICD Criteria  Current LVEF:30% ;Obtained > 6 months ago.   NYHA Functional Classification: Class III  Heart Failure History:  Yes, Duration of heart failure since onset is > 9 months  Non-Ischemic Dilated Cardiomyopathy History:  No.  Atrial Fibrillation/Atrial Flutter:  No.  Ventricular Tachycardia History:  No.  Cardiac Arrest History:  No  History of Syndromes with Risk of Sudden Death:  No.  Previous ICD:  Yes, ICD Type:  CRT-D, Reason for ICD:  Primary prevention.  LVEF is not available  Electrophysiology Study: No.  Prior MI: Yes, Most recent MI timeframe is > 40 days.  PPM: No.  OSA:  No  Patient Life Expectancy of >=1 year: Yes.  Anticoagulation Therapy:  Patient is NOT on anticoagulation therapy.   Beta Blocker Therapy:  Yes.   Ace Inhibitor/ARB Therapy:  Yes.History and Physical Interval Note:  11/07/2014 11:49 AM  [name]  has presented today for surgery, with the diagnosis of eri  The various methods of treatment have been discussed with the patient and family. After consideration of risks, benefits and other options for treatment, the patient has consented to  Procedure(s): BI-VENTRICULAR IMPLANTABLE CARDIOVERTER DEFIBRILLATOR UPGRADE (N/A) as a surgical intervention .  The patient's history has been reviewed, patient examined, no change in status, stable for surgery.  I have reviewed the patient's chart and labs.  Questions were answered to the patient's satisfaction.     Sherryl MangesSteven Tabitha Tupper

## 2014-11-07 NOTE — CV Procedure (Signed)
Preoperative diagnosis ischemic cardiomyoapthy CHF device at EOL Postoperative diagnosis same/  Procedure: Generator replacement; Assessment of HV leads   Pocket revision;   Following informed consent the patient was brought to the electrophysiology laboratory in place of the fluoroscopic table in the supine position after routine prep and drape lidocaine was infiltrated in the region of the previous incision and carried down to later the device pocket using sharp dissection and electrocautery. The pocket was opened the device was freed up and was explanted.  Interrogation of the previously implanted ICD ventricular lead Medtronic [id number]  demonstrated an R wave of 10.7  millivolts., and impedance of 467 ohms, and a pacing threshold of 0.6 volts at 0.5 msec.     Interrogation of the previously implanted Left ventricular lead Medtronic [id number]  demonstrated an R wave of 23  millivolts., and impedance of 684 ohms, and a pacing threshold of 1.7 volts at 0.5 msec.    The previously implanted atrial lead Medtronic  demonstrated a P-wave amplitude of  3.1 milllivolts and impedance of  552 ohms, and a pacing threshold of  0.6 volts at @ 0.365milliseconds.  The leads were inspected. Repair was not needed. The leads were then attached to a Medtronic  pulse generator, serial number [id number].    Through the device the P-wave amplitude  Was  2.1 milllivolts and impedance of  475 ohms, and a pacing threshold of  0.5 volts at @ 0.504milliseconds; the ICD ventricular lead demonstrated an R wave of  11.4  millivolts., and impedance of 418 ohms, and a pacing threshold of  0.75 volts at 0.4 msec and the  Left ventricular lead demonstrated an  impedance of  608 ohms, and a pacing threshold of  1.25 volts at 0.6 msec.    High voltage impedances were 62/50 ohms  tHE POCKET WAS REVISED to remove some of the flooring and to expand to allow for placing of AEGIS antimicrobial pouch  The pocket was irrigated with antibiotic  containing saline solution hemostasis was assured and the leads and the device were placed in the pocket. The wound was then closed in 2 layers in normal fashion.  The patient tolerated the procedure without apparent complication.  DFT testing was not performed  EBL Minimal   Sherryl MangesSteven Trever Streater   \

## 2014-11-07 NOTE — Discharge Instructions (Signed)
Pacemaker Battery Change, Care After °Refer to this sheet in the next few weeks. These instructions provide you with information on caring for yourself after your procedure. Your health care provider may also give you more specific instructions. Your treatment has been planned according to current medical practices, but problems sometimes occur. Call your health care provider if you have any problems or questions after your procedure. °WHAT TO EXPECT AFTER THE PROCEDURE °After your procedure, it is typical to have the following sensations: °· Soreness at the pacemaker site. °HOME CARE INSTRUCTIONS  °· Keep the incision clean and dry. °· Unless advised otherwise, you may shower beginning 48 hours after your procedure. °· For the first week after the replacement, avoid stretching motions that pull at the incision site, and avoid heavy exercise with the arm that is on the same side as the incision. °· Take medicines only as directed by your health care provider. °· Keep all follow-up visits as directed by your health care provider. °SEEK MEDICAL CARE IF:  °· You have pain at the incision site that is not relieved by over-the-counter or prescription medicine. °· There is drainage or pus from the incision site. °· There is swelling larger than a lime at the incision site. °· You develop red streaking that extends above or below the incision site. °· You feel brief, intermittent palpitations, light-headedness, or any symptoms that you feel might be related to your heart. °SEEK IMMEDIATE MEDICAL CARE IF:  °· You experience chest pain that is different than the pain at the pacemaker site. °· You experience shortness of breath. °· You have palpitations or irregular heartbeat. °· You have light-headedness that does not go away quickly. °· You faint. °· You have pain that gets worse and is not relieved by medicine. °Document Released: 06/14/2013 Document Revised: 01/08/2014 Document Reviewed: 06/14/2013 °Exit[employer]® Patient  Information ©2015 [employer]. This information is not intended to replace advice given to you by your health care provider. Make sure you discuss any questions you have with your health care provider. ° °

## 2014-11-08 ENCOUNTER — Encounter (HOSPITAL_COMMUNITY): Payer: Self-pay | Admitting: Cardiology

## 2014-11-08 MED FILL — Sodium Chloride IV Soln 0.9%: INTRAVENOUS | Qty: 50 | Status: AC

## 2014-11-09 ENCOUNTER — Telehealth: Payer: Self-pay | Admitting: Cardiovascular Disease

## 2014-11-09 NOTE — Telephone Encounter (Addendum)
Got permission from patient to speak with his daughter. Reviewed with patient date of wound check on 3/14, he states he is aware.

## 2014-11-09 NOTE — Telephone Encounter (Signed)
New message     Patients daughter is calling because she was under the impression that her dad (the Patient) is suppose to have a f/u visit  Today 11/09/14 from his procedure that he had done on Wed.  Please give patient  A call

## 2014-11-12 NOTE — Telephone Encounter (Signed)
New message  Pt called back to confirm appt. Request a call back to determine if and when the pt should resume taking the eliquis. Please call back to disucss

## 2014-11-12 NOTE — Telephone Encounter (Signed)
Pt's daughter, Herbert SetaHeather advised pt should restart Eliquis per Dr Graciela HusbandsKlein.

## 2014-11-12 NOTE — Telephone Encounter (Signed)
Per Dr Jonette EvaKlein--pt was advised to restart Eliquis 11/10/13 at time of discharge from hospital.

## 2014-11-12 NOTE — Telephone Encounter (Signed)
I spoke with pt's daughter, James PieriniHeather [name].  She states pt had device revision 11/07/14 and pt was told at hospital that day to restart Eliquis on Friday 11/09/14. She is calling to confirm that pt was given these instructions, he is hard of hearing. She states he has only taken 1 dose of Eliquis since Friday.   I do not see these instructions noted in hospital records. James advised I will forward to Dr Graciela HusbandsKlein to review.

## 2014-11-19 ENCOUNTER — Ambulatory Visit (INDEPENDENT_AMBULATORY_CARE_PROVIDER_SITE_OTHER): Payer: Medicare Other | Admitting: *Deleted

## 2014-11-19 DIAGNOSIS — I5022 Chronic systolic (congestive) heart failure: Secondary | ICD-10-CM

## 2014-11-19 DIAGNOSIS — I255 Ischemic cardiomyopathy: Secondary | ICD-10-CM

## 2014-11-19 LAB — MDC_IDC_ENUM_SESS_TYPE_INCLINIC
Battery Remaining Longevity: 7.9
Brady Statistic AS VP Percent: 16.7 %
Lead Channel Impedance Value: 475 Ohm
Lead Channel Impedance Value: 665 Ohm
Lead Channel Pacing Threshold Amplitude: 0.75 V
Lead Channel Pacing Threshold Amplitude: 0.75 V
Lead Channel Pacing Threshold Amplitude: 1.5 V
Lead Channel Pacing Threshold Pulse Width: 0.4 ms
Lead Channel Pacing Threshold Pulse Width: 0.6 ms
Lead Channel Sensing Intrinsic Amplitude: 11.4 mV
Lead Channel Sensing Intrinsic Amplitude: 2.5 mV
Lead Channel Setting Pacing Amplitude: 2.25 V
Lead Channel Setting Pacing Pulse Width: 0.4 ms
Lead Channel Setting Pacing Pulse Width: 0.6 ms
MDC IDC MSMT LEADCHNL RA IMPEDANCE VALUE: 475 Ohm
MDC IDC MSMT LEADCHNL RA PACING THRESHOLD PULSEWIDTH: 0.4 ms
MDC IDC SET LEADCHNL RA PACING AMPLITUDE: 2 V
MDC IDC SET LEADCHNL RV PACING AMPLITUDE: 2.5 V
MDC IDC SET LEADCHNL RV SENSING SENSITIVITY: 0.3 mV
MDC IDC SET ZONE DETECTION INTERVAL: 360 ms
MDC IDC SET ZONE DETECTION INTERVAL: 370 ms
MDC IDC STAT BRADY AP VP PERCENT: 82.9 %
MDC IDC STAT BRADY AP VS PERCENT: 0.3 %
MDC IDC STAT BRADY AS VS PERCENT: 0.1 %
Zone Setting Detection Interval: 320 ms
Zone Setting Detection Interval: 350 ms

## 2014-11-19 NOTE — Progress Notes (Signed)
Wound check appointment. Pressure dressing removed. Wound without redness. Minimal edema noted. Incision edges approximated, wound well healed. Normal device function. Thresholds, sensing, and impedances consistent with implant measurements. Device programmed at appropriate safety margins. Histogram distribution appropriate for patient and level of activity. No mode switches or ventricular arrhythmias noted. 1 "VS" episode x  5 sec---AT. Patient educated about wound care, arm mobility, lifting restrictions, shock plan. ROV in 3 months with SK.

## 2014-12-06 ENCOUNTER — Encounter: Payer: Self-pay | Admitting: Internal Medicine

## 2015-01-18 ENCOUNTER — Other Ambulatory Visit: Payer: Self-pay | Admitting: Cardiovascular Disease

## 2015-02-10 ENCOUNTER — Other Ambulatory Visit: Payer: Self-pay | Admitting: Cardiovascular Disease

## 2015-03-01 ENCOUNTER — Encounter: Payer: Self-pay | Admitting: Internal Medicine

## 2015-03-01 ENCOUNTER — Ambulatory Visit (INDEPENDENT_AMBULATORY_CARE_PROVIDER_SITE_OTHER): Payer: Medicare Other | Admitting: Internal Medicine

## 2015-03-01 VITALS — BP 118/66 | HR 64 | Ht 72.0 in | Wt 222.0 lb

## 2015-03-01 DIAGNOSIS — I5022 Chronic systolic (congestive) heart failure: Secondary | ICD-10-CM | POA: Diagnosis not present

## 2015-03-01 DIAGNOSIS — Z95810 Presence of automatic (implantable) cardiac defibrillator: Secondary | ICD-10-CM | POA: Diagnosis not present

## 2015-03-01 DIAGNOSIS — I255 Ischemic cardiomyopathy: Secondary | ICD-10-CM

## 2015-03-01 DIAGNOSIS — Z4502 Encounter for adjustment and management of automatic implantable cardiac defibrillator: Secondary | ICD-10-CM | POA: Diagnosis not present

## 2015-03-01 DIAGNOSIS — Z79899 Other long term (current) drug therapy: Secondary | ICD-10-CM | POA: Diagnosis not present

## 2015-03-01 LAB — BASIC METABOLIC PANEL
BUN: 16 mg/dL (ref 6–23)
CALCIUM: 9.3 mg/dL (ref 8.4–10.5)
CO2: 30 mEq/L (ref 19–32)
CREATININE: 1.01 mg/dL (ref 0.40–1.50)
Chloride: 102 mEq/L (ref 96–112)
GFR: 76.47 mL/min (ref >60.00)
GLUCOSE: 180 mg/dL — AB (ref 70–99)
Potassium: 4.1 mEq/L (ref 3.5–5.1)
SODIUM: 137 meq/L (ref 135–145)

## 2015-03-01 LAB — CUP PACEART INCLINIC DEVICE CHECK
Battery Remaining Longevity: 88 mo
Battery Voltage: 3.05 V
Brady Statistic AP VP Percent: 87.31 %
Brady Statistic AS VP Percent: 12.22 %
Brady Statistic RA Percent Paced: 87.65 %
Brady Statistic RV Percent Paced: 95.17 %
HIGH POWER IMPEDANCE MEASURED VALUE: 69 Ohm
HighPow Impedance: 190 Ohm
HighPow Impedance: 51 Ohm
Lead Channel Impedance Value: 1083 Ohm
Lead Channel Impedance Value: 475 Ohm
Lead Channel Impedance Value: 513 Ohm
Lead Channel Impedance Value: 722 Ohm
Lead Channel Pacing Threshold Amplitude: 0.625 V
Lead Channel Pacing Threshold Pulse Width: 0.4 ms
Lead Channel Pacing Threshold Pulse Width: 0.4 ms
Lead Channel Sensing Intrinsic Amplitude: 11.875 mV
Lead Channel Setting Pacing Amplitude: 2 V
Lead Channel Setting Pacing Amplitude: 2.5 V
Lead Channel Setting Pacing Pulse Width: 0.6 ms
Lead Channel Setting Sensing Sensitivity: 0.3 mV
MDC IDC MSMT LEADCHNL RA IMPEDANCE VALUE: 513 Ohm
MDC IDC MSMT LEADCHNL RA SENSING INTR AMPL: 1.875 mV
MDC IDC MSMT LEADCHNL RA SENSING INTR AMPL: 2.25 mV
MDC IDC MSMT LEADCHNL RV PACING THRESHOLD AMPLITUDE: 0.75 V
MDC IDC SESS DTM: 20160624131322
MDC IDC SET LEADCHNL LV PACING AMPLITUDE: 2.5 V
MDC IDC SET LEADCHNL RV PACING PULSEWIDTH: 0.4 ms
MDC IDC STAT BRADY AP VS PERCENT: 0.34 %
MDC IDC STAT BRADY AS VS PERCENT: 0.14 %
Zone Setting Detection Interval: 320 ms
Zone Setting Detection Interval: 350 ms
Zone Setting Detection Interval: 360 ms
Zone Setting Detection Interval: 370 ms

## 2015-03-01 MED ORDER — CARVEDILOL 12.5 MG PO TABS: 6.2500 mg | ORAL_TABLET | Freq: Two times a day (BID) | ORAL | Status: DC

## 2015-03-01 NOTE — Patient Instructions (Signed)
Medication Instructions:  Your physician recommends that you continue on your current medications as directed. Please refer to the Current Medication list given to you today.  Labwork: Spironolactone surveillance lab today: [facility]  Testing/Procedures: None ordered  Follow-Up: Remote monitoring is used to monitor your Pacemaker of ICD from home. This monitoring reduces the number of office visits required to check your device to one time per year. It allows [place] to keep an eye on the functioning of your device to ensure it is working properly. You are scheduled for a device check from home on 06/03/15. You may send your transmission at any time that day. If you have a wireless device, the transmission will be sent automatically. After your physician reviews your transmission, you will receive a postcard with your next transmission date.  Your physician wants you to follow-up in: 6 months with Dr. Elease Hashimoto.  You will receive a reminder letter in the mail two months in advance. If you don't receive a letter, please call our office to schedule the follow-up appointment.  Your physician wants you to follow-up in: 9 months with Gypsy Balsam, NP.  You will receive a reminder letter in the mail two months in advance. If you don't receive a letter, please call our office to schedule the follow-up appointment.  Thank you for choosing [facility]!!

## 2015-03-01 NOTE — Addendum Note (Signed)
Addended by: Baird Lyons on: 03/01/2015 09:54 AM   Modules accepted: Orders, Level of Service

## 2015-03-01 NOTE — Progress Notes (Signed)
Patient Care Team: Kaleen Mask, MD as PCP - General Marcello Fennel, PA-C as Physician Assistant (Urology)   HPI  [name] is a 74 y.o. male seen in followup for  CRT upgrade for congestive heart failure in the setting of ischemic heart disease . He underwent device generator replacement with antimicrobials how to support 2/16. This is healed well. His biggest issue currently is his hearing difficulty  The patient denies chest pain, shortness of breath, nocturnal dyspnea, orthopnea or peripheral edema.  There have been no palpitations, lightheadedness or syncope. He has over felt weak and he is lacking energy. He has not noted peripheral edema   Past Medical History  Diagnosis Date  . Dyslipidemia   . Gout   . BPH (benign prostatic hypertrophy)     SEVERE  . Coronary artery disease     STATUS POST ANTERIOR WALL MYOCARDIAL INFARCTION. HE IS STATUS POST PTCA AND STENTING OF HIS LAD. HIS INITIAL SET WAS COMPLICATED BY SUBACUTE THROMBOSIS RESULTING IN A LARGE ANTERIOR WALL MYOCARDIAL INFARCTIONAND SUDSEQUENT CHF.  He has been on coumadin since that time.  . Myocardial infarction   . Chronic kidney disease     BPH  . Arthritis     knees, back   . CHF (congestive heart failure)     EJECTION FRACTION 30-35%, biV ICD     Past Surgical History  Procedure Laterality Date  . Insert / replace / remove pacemaker      STATUS POST BIVENTRICULAR PACEMAKER/AICD  . Cardiac catheterization  2005  . Coronary angioplasty      5 stents   . Other surgical history      right ear surgery as a child   . Appendectomy    . Knee arthroscopy    . Prostatectomy  11/06/2011    Procedure: PROSTATECTOMY SUPRAPUBIC;  Surgeon: Kathi Ludwig, MD;  Location: [facility];  Service: Urology;  Laterality: N/A;  Open Suprapubic Prostatectomy  . Bi-ventricular implantable cardioverter defibrillator upgrade N/A 11/07/2014    Procedure: BI-VENTRICULAR IMPLANTABLE CARDIOVERTER DEFIBRILLATOR UPGRADE;   Surgeon: Duke Salvia, MD;  Location: [facility] C[facility] LAB;  Service: Cardiovascular;  Laterality: N/A;    Current Outpatient Prescriptions  Medication Sig Dispense Refill  . carvedilol (COREG) 12.5 MG tablet Take 0.5 tablets (6.25 mg total) by mouth 2 (two) times daily with a meal. 90 tablet 2  . CRESTOR 10 MG tablet Take 1 tablet by mouth  daily 90 tablet 3  . ELIQUIS 5 MG TABS tablet Take 1 tablet by mouth 2  times daily 180 tablet 0  . latanoprost (XALATAN) 0.005 % ophthalmic solution Place 1 drop into both eyes at bedtime.     [name] lisinopril (PRINIVIL,ZESTRIL) 5 MG tablet Take 1 tablet by mouth  every night at bedtime 90 tablet 0  . spironolactone (ALDACTONE) 25 MG tablet Take 0.5 tablets (12.5 mg total) by mouth daily. 15 tablet 2   No current facility-administered medications for this visit.    Allergies  Allergen Reactions  . Atorvastatin Other (See Comments)    MUSCLE ACHE     Review of Systems negative except from HPI and PMH  Physical Exam BP 118/66 mmHg  Pulse 64  Ht 6' (1.829 m)  Wt 222 lb (100.699 kg)  BMI 30.10 kg/m2 Well developed and well nourished in no acute distress HENT normal E scleral and icterus clear Neck Supple JVP flat; carotids brisk and full Clear to ausculation  Regular rate and rhythm, no murmurs gallops  or rub Soft with active bowel sounds No clubbing cyanosis none Edema Alert and oriented, grossly normal motor and sensory function Skin Warm and Dry  ECG today demonstrates AV pacing with a narrow QRS (142 ms)  Assessment and  Plan  Atrial fibrillation  Ischemic cardiomyopathy and prior stenting  Implantable defibrillator The patient's device was interrogated.  The information was reviewed. No changes were made in the programming.    No intercurrent ventricular tachycardia.  Without symptoms of ischemia  We will check a metabolic profile on his high-risk medications

## 2015-04-04 ENCOUNTER — Other Ambulatory Visit: Payer: Self-pay | Admitting: Cardiovascular Disease

## 2015-04-24 ENCOUNTER — Other Ambulatory Visit: Payer: Self-pay | Admitting: Internal Medicine

## 2015-05-22 ENCOUNTER — Encounter: Payer: Self-pay | Admitting: Cardiovascular Disease

## 2015-05-22 ENCOUNTER — Ambulatory Visit (INDEPENDENT_AMBULATORY_CARE_PROVIDER_SITE_OTHER): Payer: Medicare Other | Admitting: Cardiovascular Disease

## 2015-05-22 VITALS — BP 96/66 | HR 65 | Ht 72.0 in | Wt 211.8 lb

## 2015-05-22 DIAGNOSIS — I5022 Chronic systolic (congestive) heart failure: Secondary | ICD-10-CM

## 2015-05-22 DIAGNOSIS — I480 Paroxysmal atrial fibrillation: Secondary | ICD-10-CM | POA: Diagnosis not present

## 2015-05-22 DIAGNOSIS — I2510 Atherosclerotic heart disease of native coronary artery without angina pectoris: Secondary | ICD-10-CM | POA: Diagnosis not present

## 2015-05-22 LAB — HEPATIC FUNCTION PANEL
ALT: 17 U/L (ref 0–53)
AST: 19 U/L (ref 0–37)
Albumin: 4.1 g/dL (ref 3.5–5.2)
Alkaline Phosphatase: 66 U/L (ref 39–117)
BILIRUBIN TOTAL: 0.8 mg/dL (ref 0.2–1.2)
Bilirubin, Direct: 0.2 mg/dL (ref 0.0–0.3)
Total Protein: 7.1 g/dL (ref 6.0–8.3)

## 2015-05-22 LAB — BASIC METABOLIC PANEL
BUN: 19 mg/dL (ref 6–23)
CHLORIDE: 104 meq/L (ref 96–112)
CO2: 24 mEq/L (ref 19–32)
CREATININE: 0.92 mg/dL (ref 0.40–1.50)
Calcium: 9.5 mg/dL (ref 8.4–10.5)
GFR: 85.11 mL/min (ref >60.00)
GLUCOSE: 87 mg/dL (ref 70–99)
Potassium: 4.3 mEq/L (ref 3.5–5.1)
Sodium: 139 mEq/L (ref 135–145)

## 2015-05-22 LAB — LIPID PANEL
CHOL/HDL RATIO: 4
CHOLESTEROL: 123 mg/dL (ref 0–200)
HDL: 32.7 mg/dL — AB (ref >39.00)
LDL CALC: 64 mg/dL (ref 0–99)
NonHDL: 90.27
TRIGLYCERIDES: 131 mg/dL (ref 0.0–149.0)
VLDL: 26.2 mg/dL (ref 0.0–40.0)

## 2015-05-22 NOTE — Patient Instructions (Signed)
Medication Instructions:  Your physician recommends that you continue on your current medications as directed. Please refer to the Current Medication list given to you today.   Labwork: TODAY - cholesterol, liver, basic metabolic panel  Your physician recommends that you return for lab work in: 6 months on the day of or a few days before your office visit with Dr. Nahser.  You will need to FAST for this appointment - nothing to eat or drink after midnight the night before except water.    Testing/Procedures: None Ordered   Follow-Up: Your physician wants you to follow-up in: 6 months with Dr. Nahser.  You will receive a reminder letter in the mail two months in advance. If you don't receive a letter, please call our office to schedule the follow-up appointment.      

## 2015-05-22 NOTE — Progress Notes (Signed)
[name] Date of Birth  [birth year] [facility] / [facility] [address]     [address] [place], [place]  [address] [phone]  Fax  [phone]  Problem list: 1. Coronary artery disease-status post pedis anterior wall myocardial infarction 2. Congestive heart failure-EF of 30-35%. He has episodes of hypotension related to his CHF medications 3. Biventricular pacemaker/AICD 4. Dyslipidemia 5. BPH-status post recent prostate surgery 6. Atrial fibrillation 7. Gout   History of Present Illness:  [name] is a 74 y.o. -year-old gentleman with a history of coronary disease and previous anterior wall myocardial infarction.  He has a history of congestive heart failure. His ejection fraction is 30-35%.  He has been on coumadin since his large anterior MI and subsequent CHF.    He has a biventricular pacemaker/AICD. He also has a history of dyslipidemia. He has severe benign prostatic hypertrophy.  He denies any cardiac complaints today. He recently had prostate surgery.  His BP has been low since his prostate surgery.  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 22, 2014:  [name] is doing OK.  BP is low - feels sluggish.  He is able to do most of his normal activities most days.  No CP or dypsnea.   He walks about a mile every day.  He is having lots of muscle aches from the lipitor.  He wants to take Co-Q 10.   He has questions re:  His Optivol and if his device is working properly. He has been diagnosed with having atrial fibrillation and is on Eliquis.   December 11, 2013:  He is doing well.  He complains of sore joints and thinks that it is at least partially due to the atorvastatin.   He would like to try Crestor instead.  Sept. 14, 2016:  Doing ok Wants to cut back on eliquis due to bleeding .  Walks every day . Has lost 10 lbs recently .     Current Outpatient Prescriptions on File Prior to Visit  Medication  Sig Dispense Refill  . carvedilol (COREG) 12.5 MG tablet Take 0.5 tablets (6.25 mg total) by mouth 2 (two) times daily with a meal. 90 tablet 2  . CRESTOR 10 MG tablet Take 1 tablet by mouth  daily (Patient taking differently: TAKE 1/2 TABLET DAILY) 90 tablet 3  . ELIQUIS 5 MG TABS tablet Take 1 tablet by mouth two  times daily 180 tablet 1  . latanoprost (XALATAN) 0.005 % ophthalmic solution Place 1 drop into both eyes at bedtime.     [name] lisinopril (PRINIVIL,ZESTRIL) 5 MG tablet Take 1 tablet by mouth  every night at bedtime 90 tablet 0  . spironolactone (ALDACTONE) 25 MG tablet Take 0.5 tablets (12.5 mg total) by mouth daily. 15 tablet 2   No current facility-administered medications on file prior to visit.  spirinolcatone 25 mg a day   Allergies  Allergen Reactions  . Atorvastatin Other (See Comments)    MUSCLE ACHE     Past Medical History  Diagnosis Date  . Dyslipidemia   . Gout   . BPH (benign prostatic hypertrophy)     SEVERE  . Coronary artery disease     STATUS POST ANTERIOR WALL MYOCARDIAL INFARCTION. HE IS STATUS POST PTCA AND STENTING OF HIS LAD. HIS INITIAL SET WAS COMPLICATED BY SUBACUTE THROMBOSIS RESULTING IN A LARGE ANTERIOR WALL MYOCARDIAL INFARCTIONAND SUDSEQUENT CHF.  He has been on coumadin since that  time.  . Myocardial infarction   . Chronic kidney disease     BPH  . Arthritis     knees, back   . CHF (congestive heart failure)     EJECTION FRACTION 30-35%, biV ICD     Past Surgical History  Procedure Laterality Date  . Insert / replace / remove pacemaker      STATUS POST BIVENTRICULAR PACEMAKER/AICD  . Cardiac catheterization  2005  . Coronary angioplasty      5 stents   . Other surgical history      right ear surgery as a child   . Appendectomy    . Knee arthroscopy    . Prostatectomy  11/06/2011    Procedure: PROSTATECTOMY SUPRAPUBIC;  Surgeon: Kathi Ludwig, MD;  Location: [facility];  Service: Urology;  Laterality: N/A;  Open Suprapubic  Prostatectomy  . Bi-ventricular implantable cardioverter defibrillator upgrade N/A 11/07/2014    Procedure: BI-VENTRICULAR IMPLANTABLE CARDIOVERTER DEFIBRILLATOR UPGRADE;  Surgeon: Duke Salvia, MD;  Location: [facility] CATH LAB;  Service: Cardiovascular;  Laterality: N/A;    History  Smoking status  . Never Smoker   Smokeless tobacco  . Never Used    History  Alcohol Use No    Comment: occasional beer     Family History  Problem Relation Age of Onset  . Heart disease Sister     3 53f the 4 had HD  . Heart disease Brother     2 of 3 had HD  . Heart disease Sister     1 of the 6 has HD  . Heart disease Brother     Reviw of Systems:  Reviewed in the HPI.  All other systems are negative.  Physical Exam: BP 96/66 mmHg  Pulse 65  Ht 6' (1.829 m)  Wt 96.072 kg (211 lb 12.8 oz)  BMI 28.72 kg/m2  SpO2 95% The patient is alert and oriented x 3.  The mood and affect are normal.   Skin: warm and dry.  Color is normal.    HEENT:   the sclera are nonicteric.  The mucous membranes are moist.  The carotids are 2+ without bruits.  There is no thyromegaly.  There is no JVD.    Lungs: clear.  The chest wall is non tender.    Heart: regular rate with a normal S1 and S2.  There are no murmurs, gallops, or rubs. The PMI is not displaced.     Abdomen: good bowel sounds.  There is no guarding or rebound.  There is no hepatosplenomegaly or tenderness.  There are no masses.   Extremities:  no clubbing, cyanosis, or edema.  The legs are without rashes.  The distal pulses are intact.   Neuro:  Cranial nerves II - XII are intact.  Motor and sensory functions are intact.    The gait is normal.  ECG:  Assessment / Plan:   1. Coronary artery disease-status post pedis anterior wall myocardial infarction-  denies any angina. Continue current medications. Check fasting labs today. We'll see him again in 6 months for an office visit and fasting labs. 2. Congestive heart failure-EF of 30-35%. He has  episodes of hypotension related to his CHF medications 3. Biventricular pacemaker/AICD 4. Dyslipidemia 5. BPH-status post recent prostate surgery 6. Atrial fibrillation - continue dose of Eliquis.    Anvita Hirata, Deloris Ping, MD  05/22/2015 10:38 AM    [facility] [address] [place],  [address] [place], [place]  [address]  Pager [phone] Phone: [phone]; Fax: [phone]   [facility]  [address] [place], [place]  [address] [phone]   Fax [phone]

## 2015-05-23 ENCOUNTER — Telehealth: Payer: Self-pay | Admitting: Cardiovascular Disease

## 2015-05-23 NOTE — Telephone Encounter (Signed)
New message ° ° ° ° °Returning [name]'s call °

## 2015-05-23 NOTE — Telephone Encounter (Signed)
Lab results reviewed with patient who verbalized understanding and thanked me for the call.  

## 2015-06-03 ENCOUNTER — Encounter: Payer: Medicare Other | Admitting: *Deleted

## 2015-06-03 ENCOUNTER — Telehealth: Payer: Self-pay | Admitting: Cardiology

## 2015-06-03 NOTE — Telephone Encounter (Signed)
LMOVM reminding pt to send remote transmission.   

## 2015-06-04 ENCOUNTER — Encounter: Payer: Self-pay | Admitting: Cardiology

## 2015-06-10 ENCOUNTER — Telehealth: Payer: Self-pay | Admitting: Internal Medicine

## 2015-06-10 NOTE — Telephone Encounter (Signed)
New Message        Pt calling stating that he did a remote check and it didn't go through. Please call back and advise.

## 2015-06-10 NOTE — Telephone Encounter (Signed)
Spoke w/ pt and instructed him how to send a manual transmission. Pt verbalized understanding and was starting to send the transmission.

## 2015-06-24 ENCOUNTER — Other Ambulatory Visit: Payer: Self-pay

## 2015-06-24 MED ORDER — SPIRONOLACTONE 25 MG PO TABS: 12.5000 mg | ORAL_TABLET | Freq: Every day | ORAL | Status: DC

## 2015-07-09 ENCOUNTER — Other Ambulatory Visit: Payer: Self-pay | Admitting: Cardiovascular Disease

## 2015-08-20 ENCOUNTER — Other Ambulatory Visit: Payer: Self-pay | Admitting: Cardiovascular Disease

## 2015-09-21 ENCOUNTER — Other Ambulatory Visit: Payer: Self-pay | Admitting: Internal Medicine

## 2015-09-27 ENCOUNTER — Other Ambulatory Visit: Payer: Self-pay

## 2015-09-27 NOTE — Telephone Encounter (Signed)
Vesta Mixer, MD at 05/22/2015 10:38 AM  CRESTOR 10 MG tabletTake 1 tablet by mouth daily Patient Instructions     Medication Instructions:  Your physician recommends that you continue on your current medications as directed. Please refer to the Current Medication list given to you today.

## 2015-09-30 ENCOUNTER — Other Ambulatory Visit: Payer: Self-pay | Admitting: Cardiovascular Disease

## 2015-09-30 MED ORDER — ROSUVASTATIN CALCIUM 10 MG PO TABS: ORAL_TABLET | ORAL | Status: DC

## 2015-10-25 ENCOUNTER — Telehealth: Payer: Self-pay | Admitting: Cardiology

## 2015-10-25 ENCOUNTER — Other Ambulatory Visit: Payer: Self-pay | Admitting: *Deleted

## 2015-10-25 MED ORDER — APIXABAN 5 MG PO TABS: ORAL_TABLET | ORAL | Status: DC
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
## 2015-11-15 ENCOUNTER — Encounter: Payer: Self-pay | Admitting: Cardiovascular Disease

## 2015-11-15 ENCOUNTER — Telehealth: Payer: Self-pay | Admitting: Cardiology

## 2015-11-15 ENCOUNTER — Ambulatory Visit (INDEPENDENT_AMBULATORY_CARE_PROVIDER_SITE_OTHER): Payer: Medicare Other | Admitting: Cardiovascular Disease

## 2015-11-15 VITALS — BP 94/62 | HR 63 | Ht 72.0 in | Wt 215.4 lb

## 2015-11-15 DIAGNOSIS — I5022 Chronic systolic (congestive) heart failure: Secondary | ICD-10-CM

## 2015-11-15 DIAGNOSIS — I480 Paroxysmal atrial fibrillation: Secondary | ICD-10-CM

## 2015-11-15 DIAGNOSIS — I2510 Atherosclerotic heart disease of native coronary artery without angina pectoris: Secondary | ICD-10-CM | POA: Diagnosis not present

## 2015-11-15 DIAGNOSIS — I10 Essential (primary) hypertension: Secondary | ICD-10-CM
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
## 2015-11-15 NOTE — Patient Instructions (Signed)
Medication Instructions:  Your physician recommends that you continue on your current medications as directed. Please refer to the Current Medication list given to you today.   Labwork: None Ordered   Testing/Procedures: Your physician has requested that you have an echocardiogram. Echocardiography is a painless test that uses sound waves to create images of your heart. It provides your doctor with information about the size and shape of your heart and how well your heart's chambers and valves are working. This procedure takes approximately one hour. There are no restrictions for this procedure.   Follow-Up Your physician wants you to follow-up in: 6 months with Dr. Nahser.  You will receive a reminder letter in the mail two months in advance. If you don't receive a letter, please call our office to schedule the follow-up appointment.   If you need a refill on your cardiac medications before your next appointment, please call your pharmacy.   Thank you for choosing [facility]! Michelle Swinyer, RN [phone]    

## 2015-11-15 NOTE — Telephone Encounter (Signed)
Spoke w/ pt and informed him that I attempted to order him a home monitor earlier and I was unable to do so. Instructed pt to call [facility] [employer] [facility] and they would be able to order him a new monitor pt verbalized understanding.

## 2015-11-15 NOTE — Telephone Encounter (Signed)
LMOVM for pt to return call. Unable to order pt a new monitor. Pt will need to call [facility] [employer] [facility].
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
## 2015-12-02 ENCOUNTER — Ambulatory Visit (HOSPITAL_COMMUNITY): Payer: Medicare Other | Attending: Cardiovascular Disease

## 2015-12-02 ENCOUNTER — Other Ambulatory Visit: Payer: Self-pay

## 2015-12-02 DIAGNOSIS — I252 Old myocardial infarction: Secondary | ICD-10-CM | POA: Insufficient documentation

## 2015-12-02 DIAGNOSIS — E785 Hyperlipidemia, unspecified: Secondary | ICD-10-CM | POA: Insufficient documentation

## 2015-12-02 DIAGNOSIS — I358 Other nonrheumatic aortic valve disorders: Secondary | ICD-10-CM | POA: Insufficient documentation

## 2015-12-02 DIAGNOSIS — I5022 Chronic systolic (congestive) heart failure: Secondary | ICD-10-CM | POA: Insufficient documentation

## 2015-12-02 DIAGNOSIS — I509 Heart failure, unspecified: Secondary | ICD-10-CM | POA: Diagnosis present

## 2015-12-02 DIAGNOSIS — Z8249 Family history of ischemic heart disease and other diseases of the circulatory system: Secondary | ICD-10-CM | POA: Insufficient documentation

## 2015-12-02 DIAGNOSIS — I5189 Other ill-defined heart diseases: Secondary | ICD-10-CM | POA: Insufficient documentation

## 2015-12-02 DIAGNOSIS — I059 Rheumatic mitral valve disease, unspecified: Secondary | ICD-10-CM | POA: Insufficient documentation

## 2015-12-02 DIAGNOSIS — R29898 Other symptoms and signs involving the musculoskeletal system: Secondary | ICD-10-CM | POA: Diagnosis not present

## 2015-12-02 DIAGNOSIS — I351 Nonrheumatic aortic (valve) insufficiency: Secondary | ICD-10-CM | POA: Diagnosis not present

## 2015-12-02 DIAGNOSIS — I2510 Atherosclerotic heart disease of native coronary artery without angina pectoris: Secondary | ICD-10-CM | POA: Diagnosis not present

## 2015-12-02 DIAGNOSIS — I517 Cardiomegaly: Secondary | ICD-10-CM | POA: Insufficient documentation

## 2015-12-03 ENCOUNTER — Encounter: Payer: Self-pay | Admitting: Nurse Practitioner
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
## 2015-12-04 ENCOUNTER — Encounter: Payer: Self-pay | Admitting: Nurse Practitioner

## 2015-12-04 ENCOUNTER — Encounter: Payer: Self-pay | Admitting: Internal Medicine

## 2015-12-04 ENCOUNTER — Ambulatory Visit (INDEPENDENT_AMBULATORY_CARE_PROVIDER_SITE_OTHER): Payer: Medicare Other | Admitting: Nurse Practitioner

## 2015-12-04 VITALS — BP 106/64 | HR 68 | Ht 72.0 in | Wt 212.0 lb

## 2015-12-04 DIAGNOSIS — I255 Ischemic cardiomyopathy: Secondary | ICD-10-CM | POA: Diagnosis not present

## 2015-12-04 DIAGNOSIS — I480 Paroxysmal atrial fibrillation: Secondary | ICD-10-CM

## 2015-12-04 DIAGNOSIS — I472 Ventricular tachycardia, unspecified: Secondary | ICD-10-CM

## 2015-12-04 DIAGNOSIS — I5022 Chronic systolic (congestive) heart failure: Secondary | ICD-10-CM

## 2015-12-04 LAB — CUP PACEART INCLINIC DEVICE CHECK
Date Time Interrogation Session: 20170329104806
Implantable Lead Implant Date: 20040315
Implantable Lead Implant Date: 20100611
Implantable Lead Implant Date: 20100611
Implantable Lead Location: 753858
Implantable Lead Location: 753860
Implantable Lead Model: 6947
Lead Channel Setting Pacing Amplitude: 2.5 V
Lead Channel Setting Pacing Pulse Width: 0.4 ms
Lead Channel Setting Pacing Pulse Width: 0.6 ms
Lead Channel Setting Sensing Sensitivity: 0.3 mV
MDC IDC LEAD LOCATION: 753859
MDC IDC LEAD MODEL: 4196
MDC IDC SET LEADCHNL RA PACING AMPLITUDE: 2 V
MDC IDC SET LEADCHNL RV PACING AMPLITUDE: 2.5 V

## 2015-12-04 NOTE — Patient Instructions (Signed)
Medication Instructions:   Your physician recommends that you continue on your current medications as directed. Please refer to the Current Medication list given to you today.   If you need a refill on your cardiac medications before your next appointment, please call your pharmacy.  Labwork: NONE ORDER TODAY    Testing/Procedures: NONE ORDER TODAY    Follow-Up: Your physician wants you to follow-up in: ONE YEAR WITH KLEIN ..You will receive a reminder letter in the mail two months in advance. If you don't receive a letter, please call our office to schedule the follow-up appointment.     Any Other Special Instructions Will Be Listed Below (If Applicable).                                                                                                                                                   

## 2016-02-04 ENCOUNTER — Telehealth: Payer: Self-pay

## 2016-02-04 NOTE — Telephone Encounter (Signed)
Patient walked in for Eliquis 5 mg samples. Two weeks of samples given.

## 2016-02-12 ENCOUNTER — Other Ambulatory Visit: Payer: Self-pay | Admitting: Cardiovascular Disease

## 2016-03-06 ENCOUNTER — Telehealth: Payer: Self-pay | Admitting: Cardiology

## 2016-03-06 ENCOUNTER — Ambulatory Visit (INDEPENDENT_AMBULATORY_CARE_PROVIDER_SITE_OTHER): Payer: Medicare Other | Admitting: *Deleted

## 2016-03-06 DIAGNOSIS — I255 Ischemic cardiomyopathy: Secondary | ICD-10-CM

## 2016-03-06 DIAGNOSIS — I5022 Chronic systolic (congestive) heart failure: Secondary | ICD-10-CM

## 2016-03-06 DIAGNOSIS — Z95810 Presence of automatic (implantable) cardiac defibrillator: Secondary | ICD-10-CM

## 2016-03-06 NOTE — Telephone Encounter (Signed)
LMOVM reminding pt to send remote transmission.   

## 2016-03-09 NOTE — Progress Notes (Signed)
Remote ICD transmission.   

## 2016-03-11 ENCOUNTER — Other Ambulatory Visit: Payer: Self-pay | Admitting: Cardiovascular Disease

## 2016-03-11 LAB — CUP PACEART REMOTE DEVICE CHECK
Battery Voltage: 2.98 V
Brady Statistic AP VP Percent: 85.85 %
Brady Statistic RA Percent Paced: 86.59 %
Brady Statistic RV Percent Paced: 92.8 %
HIGH POWER IMPEDANCE MEASURED VALUE: 60 Ohm
HighPow Impedance: 46 Ohm
Implantable Lead Implant Date: 20040315
Implantable Lead Location: 753858
Implantable Lead Location: 753860
Implantable Lead Model: 4196
Implantable Lead Model: 5076
Implantable Lead Model: 6947
Lead Channel Pacing Threshold Amplitude: 0.5 V
Lead Channel Pacing Threshold Pulse Width: 0.4 ms
Lead Channel Sensing Intrinsic Amplitude: 16.5 mV
Lead Channel Sensing Intrinsic Amplitude: 2 mV
Lead Channel Sensing Intrinsic Amplitude: 2 mV
Lead Channel Setting Pacing Amplitude: 2.5 V
MDC IDC LEAD IMPLANT DT: 20100611
MDC IDC LEAD IMPLANT DT: 20100611
MDC IDC LEAD LOCATION: 753859
MDC IDC MSMT BATTERY REMAINING LONGEVITY: 71 mo
MDC IDC MSMT LEADCHNL LV IMPEDANCE VALUE: 399 Ohm
MDC IDC MSMT LEADCHNL LV IMPEDANCE VALUE: 532 Ohm
MDC IDC MSMT LEADCHNL LV IMPEDANCE VALUE: 722 Ohm
MDC IDC MSMT LEADCHNL RA IMPEDANCE VALUE: 475 Ohm
MDC IDC MSMT LEADCHNL RA PACING THRESHOLD AMPLITUDE: 0.5 V
MDC IDC MSMT LEADCHNL RA PACING THRESHOLD PULSEWIDTH: 0.4 ms
MDC IDC MSMT LEADCHNL RV IMPEDANCE VALUE: 418 Ohm
MDC IDC MSMT LEADCHNL RV IMPEDANCE VALUE: 418 Ohm
MDC IDC MSMT LEADCHNL RV SENSING INTR AMPL: 16.5 mV
MDC IDC SESS DTM: 20170701043525
MDC IDC SET LEADCHNL LV PACING PULSEWIDTH: 0.6 ms
MDC IDC SET LEADCHNL RA PACING AMPLITUDE: 2 V
MDC IDC SET LEADCHNL RV PACING AMPLITUDE: 2.5 V
MDC IDC SET LEADCHNL RV PACING PULSEWIDTH: 0.4 ms
MDC IDC SET LEADCHNL RV SENSING SENSITIVITY: 0.3 mV
MDC IDC STAT BRADY AP VS PERCENT: 0.74 %
MDC IDC STAT BRADY AS VP PERCENT: 11.71 %
MDC IDC STAT BRADY AS VS PERCENT: 1.7 %

## 2016-03-13 ENCOUNTER — Encounter: Payer: Self-pay | Admitting: Cardiology

## 2016-03-27 ENCOUNTER — Encounter: Payer: Self-pay | Admitting: Cardiology

## 2016-04-23 ENCOUNTER — Other Ambulatory Visit: Payer: Self-pay | Admitting: Internal Medicine

## 2016-05-22 ENCOUNTER — Ambulatory Visit: Payer: Medicare Other | Admitting: Cardiovascular Disease

## 2016-06-08 ENCOUNTER — Encounter: Payer: Medicare Other | Admitting: *Deleted

## 2016-06-18 ENCOUNTER — Ambulatory Visit (INDEPENDENT_AMBULATORY_CARE_PROVIDER_SITE_OTHER): Payer: Medicare Other | Admitting: *Deleted

## 2016-06-18 DIAGNOSIS — I255 Ischemic cardiomyopathy: Secondary | ICD-10-CM | POA: Diagnosis not present

## 2016-06-18 NOTE — Progress Notes (Signed)
Remote ICD transmission.   

## 2016-06-19 ENCOUNTER — Encounter: Payer: Self-pay | Admitting: Cardiology

## 2016-06-24 ENCOUNTER — Encounter: Payer: Self-pay | Admitting: Cardiovascular Disease

## 2016-06-24 ENCOUNTER — Ambulatory Visit (INDEPENDENT_AMBULATORY_CARE_PROVIDER_SITE_OTHER): Payer: Medicare Other | Admitting: Cardiovascular Disease

## 2016-06-24 ENCOUNTER — Encounter (INDEPENDENT_AMBULATORY_CARE_PROVIDER_SITE_OTHER): Payer: Self-pay

## 2016-06-24 VITALS — BP 104/60 | HR 60 | Ht 72.0 in | Wt 216.0 lb

## 2016-06-24 DIAGNOSIS — I5022 Chronic systolic (congestive) heart failure: Secondary | ICD-10-CM | POA: Diagnosis not present

## 2016-06-24 DIAGNOSIS — I2510 Atherosclerotic heart disease of native coronary artery without angina pectoris: Secondary | ICD-10-CM

## 2016-06-24 LAB — LIPID PANEL
CHOLESTEROL: 133 mg/dL (ref 125–200)
HDL: 31 mg/dL — ABNORMAL LOW (ref >=40)
LDL Cholesterol: 71 mg/dL (ref <130)
Total CHOL/HDL Ratio: 4.3 Ratio (ref <=5.0)
Triglycerides: 153 mg/dL — ABNORMAL HIGH (ref <150)
VLDL: 31 mg/dL — AB (ref <30)

## 2016-06-24 LAB — COMPREHENSIVE METABOLIC PANEL
ALK PHOS: 72 U/L (ref 40–115)
ALT: 15 U/L (ref 9–46)
AST: 21 U/L (ref 10–35)
Albumin: 4.1 g/dL (ref 3.6–5.1)
BILIRUBIN TOTAL: 0.7 mg/dL (ref 0.2–1.2)
BUN: 18 mg/dL (ref 7–25)
CALCIUM: 9.3 mg/dL (ref 8.6–10.3)
CO2: 26 mmol/L (ref 20–31)
Chloride: 103 mmol/L (ref 98–110)
Creat: 1.03 mg/dL (ref 0.70–1.18)
GLUCOSE: 66 mg/dL (ref 65–99)
Potassium: 4.5 mmol/L (ref 3.5–5.3)
Sodium: 137 mmol/L (ref 135–146)
TOTAL PROTEIN: 6.9 g/dL (ref 6.1–8.1)

## 2016-06-24 NOTE — Patient Instructions (Signed)
Medication Instructions:  Your physician recommends that you continue on your current medications as directed. Please refer to the Current Medication list given to you today.   Labwork: TODAY - cholesterol, complete metabolic panel   Testing/Procedures: None Ordered   Follow-Up: Your physician wants you to follow-up in: 6 months with Dr. Nahser.  You will receive a reminder letter in the mail two months in advance. If you don't receive a letter, please call our office to schedule the follow-up appointment.   If you need a refill on your cardiac medications before your next appointment, please call your pharmacy.   Thank you for choosing [facility]! Taje Tondreau, RN [phone]    
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
## 2016-07-02 ENCOUNTER — Telehealth: Payer: Self-pay | Admitting: Cardiovascular Disease

## 2016-07-02 NOTE — Telephone Encounter (Signed)
Informed pt of lab results. Pt verbalized understanding. 

## 2016-07-02 NOTE — Telephone Encounter (Signed)
F/u message ° °Pt returning Rn call. Please call back to discuss  °

## 2016-07-02 NOTE — Telephone Encounter (Signed)
Left message to call back  

## 2016-07-02 NOTE — Telephone Encounter (Signed)
New message ° ° ° °Pt verbalized that he is returning rn call °

## 2016-07-20 LAB — CUP PACEART REMOTE DEVICE CHECK
Brady Statistic AP VP Percent: 90.07 %
Brady Statistic AP VS Percent: 0.68 %
Brady Statistic AS VP Percent: 9.09 %
Brady Statistic RA Percent Paced: 90.75 %
Brady Statistic RV Percent Paced: 94.38 %
Date Time Interrogation Session: 20171009143238
HIGH POWER IMPEDANCE MEASURED VALUE: 51 Ohm
HighPow Impedance: 67 Ohm
Implantable Lead Implant Date: 20040315
Implantable Lead Implant Date: 20100611
Implantable Lead Location: 753859
Implantable Lead Location: 753860
Lead Channel Impedance Value: 836 Ohm
Lead Channel Pacing Threshold Amplitude: 0.5 V
Lead Channel Pacing Threshold Amplitude: 0.625 V
Lead Channel Sensing Intrinsic Amplitude: 1.875 mV
Lead Channel Setting Pacing Pulse Width: 0.4 ms
Lead Channel Setting Sensing Sensitivity: 0.3 mV
MDC IDC LEAD IMPLANT DT: 20100611
MDC IDC LEAD LOCATION: 753858
MDC IDC LEAD MODEL: 4196
MDC IDC MSMT BATTERY REMAINING LONGEVITY: 69 mo
MDC IDC MSMT BATTERY VOLTAGE: 2.97 V
MDC IDC MSMT LEADCHNL LV IMPEDANCE VALUE: 399 Ohm
MDC IDC MSMT LEADCHNL LV IMPEDANCE VALUE: 551 Ohm
MDC IDC MSMT LEADCHNL RA IMPEDANCE VALUE: 475 Ohm
MDC IDC MSMT LEADCHNL RA PACING THRESHOLD PULSEWIDTH: 0.4 ms
MDC IDC MSMT LEADCHNL RA SENSING INTR AMPL: 1.875 mV
MDC IDC MSMT LEADCHNL RV IMPEDANCE VALUE: 418 Ohm
MDC IDC MSMT LEADCHNL RV IMPEDANCE VALUE: 475 Ohm
MDC IDC MSMT LEADCHNL RV PACING THRESHOLD PULSEWIDTH: 0.4 ms
MDC IDC MSMT LEADCHNL RV SENSING INTR AMPL: 11.125 mV
MDC IDC MSMT LEADCHNL RV SENSING INTR AMPL: 11.125 mV
MDC IDC PG IMPLANT DT: 20160302
MDC IDC SET LEADCHNL LV PACING AMPLITUDE: 2.5 V
MDC IDC SET LEADCHNL LV PACING PULSEWIDTH: 0.6 ms
MDC IDC SET LEADCHNL RA PACING AMPLITUDE: 2 V
MDC IDC SET LEADCHNL RV PACING AMPLITUDE: 2.5 V
MDC IDC STAT BRADY AS VS PERCENT: 0.16 %

## 2016-07-20 NOTE — Progress Notes (Signed)
Normal remote reviewed.  Next Carelink 09/17/16 

## 2016-09-13 ENCOUNTER — Other Ambulatory Visit: Payer: Self-pay | Admitting: Cardiovascular Disease

## 2016-09-13 ENCOUNTER — Other Ambulatory Visit: Payer: Self-pay | Admitting: Internal Medicine

## 2016-09-15 ENCOUNTER — Other Ambulatory Visit: Payer: Self-pay | Admitting: Cardiovascular Disease

## 2016-09-17 ENCOUNTER — Other Ambulatory Visit: Payer: Self-pay | Admitting: *Deleted

## 2016-09-17 ENCOUNTER — Ambulatory Visit (INDEPENDENT_AMBULATORY_CARE_PROVIDER_SITE_OTHER): Payer: Medicare Other | Admitting: *Deleted

## 2016-09-17 DIAGNOSIS — I255 Ischemic cardiomyopathy: Secondary | ICD-10-CM

## 2016-09-17 NOTE — Progress Notes (Signed)
Remote ICD transmission.   

## 2016-09-17 NOTE — Telephone Encounter (Signed)
Rx refill sent to pharmacy. 

## 2016-09-18 ENCOUNTER — Encounter: Payer: Self-pay | Admitting: Cardiology

## 2016-09-28 LAB — CUP PACEART REMOTE DEVICE CHECK
Battery Remaining Longevity: 63 mo
Battery Voltage: 2.98 V
Brady Statistic AS VP Percent: 15.32 %
Brady Statistic RA Percent Paced: 81.46 %
HighPow Impedance: 46 Ohm
HighPow Impedance: 60 Ohm
Implantable Lead Implant Date: 20040315
Implantable Lead Location: 753858
Implantable Lead Location: 753860
Implantable Lead Model: 4196
Implantable Lead Model: 5076
Implantable Lead Model: 6947
Lead Channel Impedance Value: 361 Ohm
Lead Channel Pacing Threshold Amplitude: 0.5 V
Lead Channel Sensing Intrinsic Amplitude: 13.875 mV
Lead Channel Sensing Intrinsic Amplitude: 13.875 mV
Lead Channel Sensing Intrinsic Amplitude: 2.25 mV
Lead Channel Sensing Intrinsic Amplitude: 2.25 mV
Lead Channel Setting Pacing Amplitude: 2.5 V
Lead Channel Setting Pacing Pulse Width: 0.6 ms
MDC IDC LEAD IMPLANT DT: 20100611
MDC IDC LEAD IMPLANT DT: 20100611
MDC IDC LEAD LOCATION: 753859
MDC IDC MSMT LEADCHNL LV IMPEDANCE VALUE: 475 Ohm
MDC IDC MSMT LEADCHNL LV IMPEDANCE VALUE: 722 Ohm
MDC IDC MSMT LEADCHNL RA IMPEDANCE VALUE: 513 Ohm
MDC IDC MSMT LEADCHNL RA PACING THRESHOLD AMPLITUDE: 0.625 V
MDC IDC MSMT LEADCHNL RA PACING THRESHOLD PULSEWIDTH: 0.4 ms
MDC IDC MSMT LEADCHNL RV IMPEDANCE VALUE: 418 Ohm
MDC IDC MSMT LEADCHNL RV IMPEDANCE VALUE: 456 Ohm
MDC IDC MSMT LEADCHNL RV PACING THRESHOLD PULSEWIDTH: 0.4 ms
MDC IDC PG IMPLANT DT: 20160302
MDC IDC SESS DTM: 20180111083325
MDC IDC SET LEADCHNL RA PACING AMPLITUDE: 2 V
MDC IDC SET LEADCHNL RV PACING AMPLITUDE: 2.5 V
MDC IDC SET LEADCHNL RV PACING PULSEWIDTH: 0.4 ms
MDC IDC SET LEADCHNL RV SENSING SENSITIVITY: 0.3 mV
MDC IDC STAT BRADY AP VP PERCENT: 82.53 %
MDC IDC STAT BRADY AP VS PERCENT: 0.59 %
MDC IDC STAT BRADY AS VS PERCENT: 1.56 %
MDC IDC STAT BRADY RV PERCENT PACED: 91.72 %

## 2016-10-04 ENCOUNTER — Other Ambulatory Visit: Payer: Self-pay | Admitting: Cardiovascular Disease

## 2016-10-20 ENCOUNTER — Telehealth: Payer: Self-pay | Admitting: Internal Medicine

## 2016-10-20 NOTE — Telephone Encounter (Signed)
Did not need this encounter °

## 2016-11-24 ENCOUNTER — Encounter: Payer: Self-pay | Admitting: Cardiology

## 2016-12-03 ENCOUNTER — Encounter: Payer: Medicare Other | Admitting: Cardiology

## 2016-12-04 ENCOUNTER — Ambulatory Visit (INDEPENDENT_AMBULATORY_CARE_PROVIDER_SITE_OTHER): Payer: Medicare Other | Admitting: Internal Medicine

## 2016-12-04 ENCOUNTER — Encounter: Payer: Self-pay | Admitting: Internal Medicine

## 2016-12-04 ENCOUNTER — Encounter (INDEPENDENT_AMBULATORY_CARE_PROVIDER_SITE_OTHER): Payer: Self-pay

## 2016-12-04 VITALS — BP 102/60 | HR 64 | Ht 71.0 in | Wt 223.0 lb

## 2016-12-04 DIAGNOSIS — I255 Ischemic cardiomyopathy: Secondary | ICD-10-CM | POA: Diagnosis not present

## 2016-12-04 DIAGNOSIS — I5022 Chronic systolic (congestive) heart failure: Secondary | ICD-10-CM

## 2016-12-04 DIAGNOSIS — Z95810 Presence of automatic (implantable) cardiac defibrillator: Secondary | ICD-10-CM

## 2016-12-04 DIAGNOSIS — I480 Paroxysmal atrial fibrillation: Secondary | ICD-10-CM

## 2016-12-04 LAB — CUP PACEART INCLINIC DEVICE CHECK
Battery Voltage: 2.97 V
Brady Statistic AP VP Percent: 86.02 %
Brady Statistic AP VS Percent: 0.66 %
Brady Statistic AS VP Percent: 12.49 %
Brady Statistic AS VS Percent: 0.82 %
Date Time Interrogation Session: 20180330171629
HighPow Impedance: 52 Ohm
HighPow Impedance: 68 Ohm
Implantable Lead Implant Date: 20100611
Implantable Lead Implant Date: 20100611
Implantable Lead Location: 753858
Implantable Lead Location: 753859
Implantable Lead Model: 4196
Lead Channel Impedance Value: 418 Ohm
Lead Channel Impedance Value: 456 Ohm
Lead Channel Impedance Value: 513 Ohm
Lead Channel Impedance Value: 836 Ohm
Lead Channel Pacing Threshold Amplitude: 0.75 V
Lead Channel Pacing Threshold Amplitude: 1 V
Lead Channel Pacing Threshold Pulse Width: 0.4 ms
Lead Channel Pacing Threshold Pulse Width: 0.4 ms
Lead Channel Sensing Intrinsic Amplitude: 2.125 mV
Lead Channel Setting Pacing Amplitude: 1.5 V
Lead Channel Setting Pacing Amplitude: 1.5 V
Lead Channel Setting Pacing Pulse Width: 0.4 ms
Lead Channel Setting Pacing Pulse Width: 1 ms
Lead Channel Setting Sensing Sensitivity: 0.3 mV
MDC IDC LEAD IMPLANT DT: 20040315
MDC IDC LEAD LOCATION: 753860
MDC IDC MSMT BATTERY REMAINING LONGEVITY: 70 mo
MDC IDC MSMT LEADCHNL LV PACING THRESHOLD PULSEWIDTH: 0.6 ms
MDC IDC MSMT LEADCHNL RA IMPEDANCE VALUE: 475 Ohm
MDC IDC MSMT LEADCHNL RA SENSING INTR AMPL: 2.875 mV
MDC IDC MSMT LEADCHNL RV IMPEDANCE VALUE: 513 Ohm
MDC IDC MSMT LEADCHNL RV PACING THRESHOLD AMPLITUDE: 0.75 V
MDC IDC MSMT LEADCHNL RV SENSING INTR AMPL: 13.5 mV
MDC IDC MSMT LEADCHNL RV SENSING INTR AMPL: 16.125 mV
MDC IDC PG IMPLANT DT: 20160302
MDC IDC SET LEADCHNL LV PACING AMPLITUDE: 1 V
MDC IDC STAT BRADY RA PERCENT PACED: 85.86 %
MDC IDC STAT BRADY RV PERCENT PACED: 92.98 %

## 2016-12-04 NOTE — Progress Notes (Signed)
Patient Care Team: Kaleen Mask, MD as PCP - General Marcello Fennel, PA-C as Physician Assistant (Urology)   HPI  [name] is a 76 y.o. male seen in followup for  CRT upgrade for congestive heart failure in the setting of ischemic heart disease with   device generator replacement  2/16. This is healed well.     The patient denies chest pain, shortness of breath, nocturnal dyspnea, orthopnea or peripheral edema.  There have been no palpitations, lightheadedness or syncope.  He has not noted peripheral edema  Overall has been doing better   Past Medical History:  Diagnosis Date  . Arthritis    knees, back   . BPH (benign prostatic hypertrophy)   . Chronic kidney disease    BPH  . Chronic systolic heart failure (HCC)    a. s/p MDT single chamber ICD 2004 as part of MASTER study b. upgrade to CRTD 2010; CRTD gen change 2016  . Coronary artery disease    a. s/p anterior MI and CYPHER stent to LAD 2005  . Dyslipidemia   . Gout   . Ischemic cardiomyopathy   . Paroxysmal atrial fibrillation (HCC)   . Ventricular tachycardia [facility])     Past Surgical History:  Procedure Laterality Date  . APPENDECTOMY    . BI-VENTRICULAR IMPLANTABLE CARDIOVERTER DEFIBRILLATOR UPGRADE N/A 11/07/2014   a. MDT single chamber ICD implanted 2004 as part of MASTER study; upgrade to CRTD 2010; gen change 2016  . CARDIAC CATHETERIZATION  2005  . CORONARY ANGIOPLASTY     5 stents   . KNEE ARTHROSCOPY    . OTHER SURGICAL HISTORY     right ear surgery as a child   . PROSTATECTOMY  11/06/2011   Procedure: PROSTATECTOMY SUPRAPUBIC;  Surgeon: Kathi Ludwig, MD;  Location: [facility];  Service: Urology;  Laterality: N/A;  Open Suprapubic Prostatectomy    Current Outpatient Prescriptions  Medication Sig Dispense Refill  . allopurinol (ZYLOPRIM) 100 MG tablet Take 100 mg by mouth daily.    . carvedilol (COREG) 12.5 MG tablet TAKE ONE-HALF TABLET BY  MOUTH TWICE A DAY WITH A  MEAL 90 tablet 1  .  ELIQUIS 5 MG TABS tablet Take 1 tablet by mouth two  times daily 180 tablet 1  . latanoprost (XALATAN) 0.005 % ophthalmic solution Place 1 drop into both eyes at bedtime.     [name] lisinopril (PRINIVIL,ZESTRIL) 5 MG tablet TAKE 1 TABLET BY MOUTH  EVERY NIGHT AT BEDTIME 90 tablet 1  . rosuvastatin (CRESTOR) 10 MG tablet TAKE 1 TABLET BY MOUTH  DAILY 90 tablet 0  . spironolactone (ALDACTONE) 25 MG tablet TAKE ONE-HALF TABLET BY  MOUTH DAILY 45 tablet 2  . TURMERIC PO Take 1 tablet by mouth daily.     No current facility-administered medications for this visit.     Allergies  Allergen Reactions  . Atorvastatin Other (See Comments)    MUSCLE ACHE     Review of Systems negative except from HPI and PMH  Physical Exam BP 102/60   Pulse 64   Ht  (1.803 m)   Wt 223 lb (101.2 kg)   SpO2 93%   BMI 31.10 kg/m  Well developed and well nourished in no acute distress HENT normal E scleral and icterus clear Neck Supple JVP flat; carotids brisk and full Clear to ausculation  Regular rate and rhythm, no murmurs gallops or rub Soft with active bowel sounds No clubbing cyanosis none Edema Alert and oriented,  grossly normal motor and sensory function Skin Warm and Dry  ECG today demonstrates AV pacing with a narrow QRS (142 ms)  Assessment and  Plan  Atrial fibrillation  Ischemic cardiomyopathy and prior stenting  Implantable defibrillator The patient's device was interrogated.  The information was reviewed. No changes were made in the programming.    No intercurrent ventricular tachycardia.  No intercurrent atrial fibrillation or flutter  On Anticoagulation;  No bleeding issues   Without symptoms of ischemia  We will check a metabolic profile on his high-risk medications  Working on losing weight

## 2016-12-04 NOTE — Patient Instructions (Addendum)
Medication Instructions: - Your physician recommends that you continue on your current medications as directed. Please refer to the Current Medication list given to you today.  Labwork: - none ordered  Procedures/Testing: - none oredered  Follow-Up: - Remote monitoring is used to monitor your Pacemaker of ICD from home. This monitoring reduces the number of office visits required to check your device to one time per year. It allows [place] to keep an eye on the functioning of your device to ensure it is working properly. You are scheduled for a device check from home on 03/08/17. You may send your transmission at any time that day. If you have a wireless device, the transmission will be sent automatically. After your physician reviews your transmission, you will receive a postcard with your next transmission date.  - Your physician wants you to follow-up in: 6 months with Gypsy Balsam, NP for Dr. Graciela Husbands. You will receive a reminder letter in the mail two months in advance. If you don't receive a letter, please call our office to schedule the follow-up appointment.  Any Additional Special Instructions Will Be Listed Below (If Applicable).     If you need a refill on your cardiac medications before your next appointment, please call your pharmacy.

## 2016-12-16 ENCOUNTER — Telehealth: Payer: Self-pay | Admitting: Internal Medicine

## 2016-12-16 NOTE — Telephone Encounter (Signed)
Patient calling the office for samples of medication: ° ° °1.  What medication and dosage are you requesting samples for?eliquis 5mg ° °2.  Are you currently out of this medication? yes ° ° °

## 2016-12-16 NOTE — Telephone Encounter (Signed)
I have made multiple attempts to reach patient with no success. I will place one box of samples at the front desk.

## 2016-12-17 ENCOUNTER — Other Ambulatory Visit: Payer: Self-pay | Admitting: Cardiovascular Disease

## 2016-12-17 ENCOUNTER — Other Ambulatory Visit: Payer: Self-pay | Admitting: Internal Medicine

## 2016-12-18 NOTE — Telephone Encounter (Signed)
Should this be sent in for one-half or one tab qd? He has reported at several office visits that he only takes one-half. Please advise. Thanks, MI

## 2016-12-18 NOTE — Telephone Encounter (Signed)
Ok to fill for 5 mg

## 2017-03-04 ENCOUNTER — Other Ambulatory Visit: Payer: Self-pay | Admitting: Cardiovascular Disease

## 2017-03-04 ENCOUNTER — Other Ambulatory Visit: Payer: Self-pay | Admitting: Internal Medicine

## 2017-03-08 ENCOUNTER — Ambulatory Visit (INDEPENDENT_AMBULATORY_CARE_PROVIDER_SITE_OTHER): Payer: Medicare Other | Admitting: *Deleted

## 2017-03-08 ENCOUNTER — Telehealth: Payer: Self-pay | Admitting: Internal Medicine

## 2017-03-08 ENCOUNTER — Telehealth: Payer: Self-pay | Admitting: Cardiology

## 2017-03-08 DIAGNOSIS — I255 Ischemic cardiomyopathy: Secondary | ICD-10-CM

## 2017-03-08 NOTE — Telephone Encounter (Signed)
New message ° ° ° °Pt is returning call to Barbara. °

## 2017-03-08 NOTE — Telephone Encounter (Signed)
Informed patient that Britta MccreedyBarbara wanted him to send in a manual transmission. Patient verbalized understanding and states that he will send it today.

## 2017-03-08 NOTE — Telephone Encounter (Signed)
LMTCB//sss 

## 2017-03-08 NOTE — Telephone Encounter (Signed)
LMOVM reminding pt to send remote transmission.   

## 2017-03-09 ENCOUNTER — Encounter: Payer: Self-pay | Admitting: Cardiology

## 2017-03-09 LAB — CUP PACEART REMOTE DEVICE CHECK
Battery Remaining Longevity: 66 mo
Battery Voltage: 2.97 V
Brady Statistic AS VP Percent: 8.8 %
Brady Statistic AS VS Percent: 0.16 %
Brady Statistic RV Percent Paced: 96.8 %
HighPow Impedance: 49 Ohm
HighPow Impedance: 66 Ohm
Implantable Lead Implant Date: 20040315
Implantable Lead Implant Date: 20100611
Implantable Lead Location: 753858
Implantable Lead Location: 753859
Implantable Lead Model: 4196
Implantable Lead Model: 5076
Implantable Lead Model: 6947
Implantable Pulse Generator Implant Date: 20160302
Lead Channel Impedance Value: 456 Ohm
Lead Channel Impedance Value: 513 Ohm
Lead Channel Impedance Value: 513 Ohm
Lead Channel Pacing Threshold Pulse Width: 0.4 ms
Lead Channel Pacing Threshold Pulse Width: 0.4 ms
Lead Channel Sensing Intrinsic Amplitude: 17.375 mV
Lead Channel Sensing Intrinsic Amplitude: 17.375 mV
Lead Channel Sensing Intrinsic Amplitude: 2.25 mV
Lead Channel Setting Pacing Amplitude: 1 V
Lead Channel Setting Pacing Amplitude: 1.5 V
Lead Channel Setting Pacing Amplitude: 1.5 V
Lead Channel Setting Pacing Pulse Width: 1 ms
Lead Channel Setting Sensing Sensitivity: 0.3 mV
MDC IDC LEAD IMPLANT DT: 20100611
MDC IDC LEAD LOCATION: 753860
MDC IDC MSMT LEADCHNL LV IMPEDANCE VALUE: 399 Ohm
MDC IDC MSMT LEADCHNL LV IMPEDANCE VALUE: 817 Ohm
MDC IDC MSMT LEADCHNL RA PACING THRESHOLD AMPLITUDE: 0.75 V
MDC IDC MSMT LEADCHNL RA SENSING INTR AMPL: 2.25 mV
MDC IDC MSMT LEADCHNL RV IMPEDANCE VALUE: 475 Ohm
MDC IDC MSMT LEADCHNL RV PACING THRESHOLD AMPLITUDE: 0.5 V
MDC IDC SESS DTM: 20180702215635
MDC IDC SET LEADCHNL RV PACING PULSEWIDTH: 0.4 ms
MDC IDC STAT BRADY AP VP PERCENT: 90.6 %
MDC IDC STAT BRADY AP VS PERCENT: 0.44 %
MDC IDC STAT BRADY RA PERCENT PACED: 90.72 %

## 2017-03-09 NOTE — Progress Notes (Signed)
Remote ICD transmission.   

## 2017-03-18 ENCOUNTER — Encounter: Payer: Medicare Other | Attending: Physician Assistant | Admitting: Physician Assistant

## 2017-03-18 DIAGNOSIS — I872 Venous insufficiency (chronic) (peripheral): Secondary | ICD-10-CM | POA: Diagnosis not present

## 2017-03-18 DIAGNOSIS — I739 Peripheral vascular disease, unspecified: Secondary | ICD-10-CM | POA: Insufficient documentation

## 2017-03-18 DIAGNOSIS — M109 Gout, unspecified: Secondary | ICD-10-CM | POA: Insufficient documentation

## 2017-03-18 DIAGNOSIS — M1990 Unspecified osteoarthritis, unspecified site: Secondary | ICD-10-CM | POA: Insufficient documentation

## 2017-03-18 DIAGNOSIS — L97822 Non-pressure chronic ulcer of other part of left lower leg with fat layer exposed: Secondary | ICD-10-CM | POA: Insufficient documentation

## 2017-03-18 DIAGNOSIS — I10 Essential (primary) hypertension: Secondary | ICD-10-CM | POA: Diagnosis not present

## 2017-03-19 NOTE — Progress Notes (Signed)
Monico BlitzHICE, Eugene L. ([id number]) Visit Report for 03/18/2017 Abuse/Suicide Risk Screen Details Patient Name: Rost, Nakhi L. Date of Service: 03/18/2017 8:15 AM Medical Record Number: [id number] Patient Account Number: [id number] Date of Birth/Sex: Aug 13, 1941 [age] y.o. Male) Treating RN: Clover MealyAfful, RN, BSN, [place] Primary Care Blen Ransome: Windle GuardELKINS, WILSON Other Clinician: Referring Destin Vinsant: Windle GuardELKINS, WILSON Treating Ceniya Fowers/Extender: Linwood DibblesSTONE III, HOYT Weeks in Treatment: 0 Abuse/Suicide Risk Screen Items Answer ABUSE/SUICIDE RISK SCREEN: Has anyone close to you tried to hurt or harm you recentlyo No Do you feel uncomfortable with anyone in your familyo No Has anyone forced you do things that you didnot want to doo No Patient displays signs or symptoms of abuse and/or neglect. No Electronic Signature(s) Signed: 03/18/2017 11:52:37 AM By: Elpidio EricAfful, Rita BSN, RN Entered By: Elpidio EricAfful, Rita on 03/18/2017 08:38:51 Bantz, Ferdinand LangoICHARD L. ([id number]) -------------------------------------------------------------------------------- Activities of Daily Living Details Patient Name: Fetterman, Taj L. Date of Service: 03/18/2017 8:15 AM Medical Record Number: [id number] Patient Account Number: [id number] Date of Birth/Sex: Aug 13, 1941 [age] y.o. Male) Treating RN: Clover MealyAfful, RN, BSN, [place] Sinkita Primary Care Aleksa Catterton: Windle GuardELKINS, WILSON Other Clinician: Referring Kassidy Dockendorf: Windle GuardELKINS, WILSON Treating Aubriauna Riner/Extender: Linwood DibblesSTONE III, HOYT Weeks in Treatment: 0 Activities of Daily Living Items Answer Activities of Daily Living (Please select one for each item) Drive Automobile Completely Able Take Medications Completely Able Use Telephone Completely Able Care for Appearance Completely Able Use Toilet Completely Able Bath / Shower Completely Able Dress Self Completely Able Feed Self Completely Able Walk Completely Able Get In / Out Bed Completely Able Housework Completely Able Prepare Meals Completely Able Handle Money Completely  Able Electronic Signature(s) Signed: 03/18/2017 11:52:37 AM By: Elpidio EricAfful, Rita BSN, RN Entered By: Elpidio EricAfful, Rita on 03/18/2017 08:42:52 Ploch, Ferdinand LangoICHARD L. ([id number]) -------------------------------------------------------------------------------- Education Assessment Details Patient Name: Lebeck, Claudio L. Date of Service: 03/18/2017 8:15 AM Medical Record Number: [id number] Patient Account Number: [id number] Date of Birth/Sex: Aug 13, 1941 [age] y.o. Male) Treating RN: Clover MealyAfful, RN, BSN,  [place] Primary Care Zamantha Strebel: Windle GuardELKINS, WILSON Other Clinician: Referring Dayrin Stallone: Windle GuardELKINS, WILSON Treating Gianny Killman/Extender: Linwood DibblesSTONE III, HOYT Weeks in Treatment: 0 Primary Learner Assessed: Patient Learning Preferences/Education Level/Primary Language Learning Preference: Explanation Highest Education Level: College or Above Preferred Language: English Cognitive Barrier Assessment/Beliefs Language Barrier: No Physical Barrier Assessment Impaired Vision: Yes Glasses Impaired Hearing: Yes Decreased Hand dexterity: No Knowledge/Comprehension Assessment Knowledge Level: High Comprehension Level: High Ability to understand written High instructions: Ability to understand verbal High instructions: Motivation Assessment Anxiety Level: Calm Cooperation: Cooperative Education Importance: Acknowledges Need Interest in Health Problems: Asks Questions Perception: Coherent Willingness to Engage in Self- High Management Activities: Readiness to Engage in Self- High Management Activities: Electronic Signature(s) Signed: 03/18/2017 11:52:37 AM By: Elpidio EricAfful, Rita BSN, RN Entered By: Elpidio EricAfful, Rita on 03/18/2017 08:43:34 Barro, Ferdinand LangoICHARD L. ([id number]) -------------------------------------------------------------------------------- Fall Risk Assessment Details Patient Name: Oswald, Kimsey L. Date of Service: 03/18/2017 8:15 AM Medical Record Number: [id number] Patient Account Number: [id number] Date of Birth/Sex: Aug 13, 1941  [age] y.o. Male) Treating RN: Clover MealyAfful, RN, BSN, Rita Primary Care Colbie Danner: Windle GuardELKINS, WILSON Other Clinician: Referring Merridy Pascoe: Windle GuardELKINS, WILSON Treating Valdis Bevill/Extender: Linwood DibblesSTONE III, HOYT Weeks in Treatment: 0 Fall Risk Assessment Items Have you had 2 or more falls in the last 12 monthso 0 No Have you had any fall that resulted in injury in the last 12 monthso 0 No FALL RISK ASSESSMENT: History of falling - immediate or within 3 months 0 No Secondary diagnosis 0 No Ambulatory aid None/bed rest/wheelchair/nurse 0 Yes Crutches/cane/walker 0 No Furniture 0 No IV Access/Saline Lock 0 No Gait/Training Normal/bed rest/immobile 0 Yes Weak 0 No  Impaired 0 No Mental Status Oriented to own ability 0 Yes Electronic Signature(s) Signed: 03/18/2017 11:52:37 AM By: Elpidio Eric BSN, RN Entered By: Elpidio Eric on 03/18/2017 08:43:52 Carapia, [name] ([id number]) -------------------------------------------------------------------------------- Foot Assessment Details Patient Name: Belair, Neal L. Date of Service: 03/18/2017 8:15 AM Medical Record Number: [id number] Patient Account Number: [id number] Date of Birth/Sex: 06/21/1941 (76 y.o. Male) Treating RN: Clover Mealy, RN, BSN, [place] Sink Primary Care Sho Salguero: Windle Guard Other Clinician: Referring Kawanda Drumheller: Windle Guard Treating Dylan Monforte/Extender: STONE III, HOYT Weeks in Treatment: 0 Foot Assessment Items Site Locations + = Sensation present, - = Sensation absent, C = Callus, U = Ulcer R = Redness, W = Warmth, M = Maceration, PU = Pre-ulcerative lesion F = Fissure, S = Swelling, D = Dryness Assessment Right: Left: Other Deformity: No No Prior Foot Ulcer: No No Prior Amputation: No No Charcot Joint: No No Ambulatory Status: Ambulatory Without Help Gait: Steady Electronic Signature(s) Signed: 03/18/2017 11:52:37 AM By: Elpidio Eric BSN, RN Entered By: Elpidio Eric on 03/18/2017 08:44:22 Wasilewski, [name]  ([id number]) -------------------------------------------------------------------------------- Nutrition Risk Assessment Details Patient Name: Sheffer, Sulaiman L. Date of Service: 03/18/2017 8:15 AM Medical Record Number: [id number] Patient Account Number: [id number] Date of Birth/Sex: 1941/05/19 (76 y.o. Male) Treating RN: Clover Mealy, RN, BSN, Rita Primary Care Dang Mathison: Windle Guard Other Clinician: Referring Daesia Zylka: Windle Guard Treating Kambry Takacs/Extender: Linwood Dibbles, HOYT Weeks in Treatment: 0 Height (in): 72 Weight (lbs): 226 Body Mass Index (BMI): 30.6 Nutrition Risk Assessment Items NUTRITION RISK SCREEN: I have an illness or condition that made me change the kind and/or 0 No amount of food I eat I eat fewer than two meals per day 0 No I eat few fruits and vegetables, or milk products 0 No I have three or more drinks of beer, liquor or wine almost every day 0 No I have tooth or mouth problems that make it hard for me to eat 0 No I don't always have enough money to buy the food I need 0 No I eat alone most of the time 0 No I take three or more different prescribed or over-the-counter drugs a 0 No day Without wanting to, I have lost or gained 10 pounds in the last six 0 No months I am not always physically able to shop, cook and/or feed myself 0 No Nutrition Protocols Good Risk Protocol 0 No interventions needed Moderate Risk Protocol Electronic Signature(s) Signed: 03/18/2017 11:52:37 AM By: Elpidio Eric BSN, RN Entered By: Elpidio Eric on 03/18/2017 08:44:05

## 2017-03-20 NOTE — Progress Notes (Signed)
TIPTON, BALLOW ([id number]) Visit Report for 03/18/2017 Chief Complaint Document Details Patient Name: [name], [name] Date of Service: 03/18/2017 8:15 AM Medical Record Number: [id number] Patient Account Number: [id number] Date of Birth/Sex: 1940-09-09 (76 y.o. Male) Treating RN: Clover Mealy, RN, BSN, [place] Sink Primary Care Provider: Windle Guard Other Clinician: Referring Provider: Windle Guard Treating Provider/Extender: Linwood Dibbles,  Weeks in Treatment: 0 Information Obtained from: Patient Chief Complaint Left LE Ulcer Electronic Signature(s) Signed: 03/18/2017 5:22:22 PM By: Lenda Kelp PA-C Entered By: Lenda Kelp on 03/18/2017 09:32:59 [name], [name] ([id number]) -------------------------------------------------------------------------------- Debridement Details Patient Name: James Deed L. Date of Service: 03/18/2017 8:15 AM Medical Record Number: [id number] Patient Account Number: [id number] Date of Birth/Sex: 1941-08-12 (76 y.o. Male) Treating RN: Clover Mealy, RN, BSN, [place] Sink Primary Care Provider: Windle Guard Other Clinician: Referring Provider: Windle Guard Treating Provider/Extender: STONE III,  Weeks in Treatment: 0 Debridement Performed for Wound #1 Left,Lateral Lower Leg Assessment: Performed By: Physician STONE III,  E., PA-C Debridement: Debridement Pre-procedure Verification/Time Out Yes - 08:54 Taken: Start Time: 08:54 Pain Control: Lidocaine 4% Topical Solution Level: Skin/Subcutaneous Tissue Total Area Debrided (L x 2.5 (cm) x 2.5 (cm) = 6.25 (cm) W): Tissue and other Non-Viable, Exudate, Fat, Fibrin/Slough, Subcutaneous material debrided: Instrument: Curette Bleeding: Moderate Hemostasis Achieved: Pressure End Time: 08:58 Procedural Pain: 0 Post Procedural Pain: 0 Response to Treatment: Procedure was tolerated well Post Debridement Measurements of Total Wound Length: (cm) 2.5 Width: (cm) 2.5 Depth: (cm) 0.2 Volume: (cm)  0.982 Character of Wound/Ulcer Post Stable Debridement: Post Procedure Diagnosis Same as Pre-procedure Electronic Signature(s) Signed: 03/18/2017 11:52:37 AM By: Elpidio Eric BSN, RN Signed: 03/18/2017 5:22:22 PM By: Lenda Kelp PA-C Entered By: Elpidio Eric on 03/18/2017 08:59:57 [name], [name] ([id number]) -------------------------------------------------------------------------------- HPI Details Patient Name: Mogg, Isiac L. Date of Service: 03/18/2017 8:15 AM Medical Record Number: [id number] Patient Account Number: [id number] Date of Birth/Sex: 1940/11/06 (76 y.o. Male) Treating RN: Clover Mealy, RN, BSN, [place] Sink Primary Care Provider: Windle Guard Other Clinician: Referring Provider: Windle Guard Treating Provider/Extender: Linwood Dibbles,  Weeks in Treatment: 0 History of Present Illness HPI Description: 03/18/17 on evaluation today patient presents for initial visit concerning an ulcer he has on the left lateral lower extremity which has been present since mid February 2018. Unfortunately due to other family circumstances evaluation and treatment of this wound has been put on hold until this point when they could finally get him into be seen. He does have high blood pressure but has no history of diabetes though he does tell me that his cardiologist had been monitoring him for hyperglycemia and he is "borderline". He does have some discomfort in regard to this wound but fortunately this is not severe and typically with cleansing. His main concern is why this wound is not healing as in the past he has never had any difficulty with healing. The initial injury was during the period of time where he was helping a friend with some work and scrape the leg on a brick during that process. It has just never healed since that time. Patient is on chronic anticoagulant therapy and has not had any cardiovascular procedures such as arterial or venous imaging. Electronic Signature(s) Signed:  03/18/2017 5:22:22 PM By: Lenda Kelp PA-C Entered By: Lenda Kelp on 03/18/2017 10:24:29 [name], [name] ([id number]) -------------------------------------------------------------------------------- Physical Exam Details Patient Name: Mcquerry, Miron L. Date of Service: 03/18/2017 8:15 AM Medical Record Number: [id number] Patient Account Number: [id number] Date of Birth/Sex: 21-Feb-1941 (76 y.o. Male) Treating RN:  Afful, RN, BSN, [place] Sink Primary Care Provider: Windle Guard Other Clinician: Referring Provider: Windle Guard Treating Provider/Extender: Linwood Dibbles,  Weeks in Treatment: 0 Constitutional sitting or standing blood pressure is within target range for patient.. pulse regular and within target range for patient.[name] respirations regular, non-labored and within target range for patient.[name] temperature within target range for patient.. Well-nourished and well-hydrated in no acute distress. Eyes conjunctiva clear no eyelid edema noted. pupils equal round and reactive to light and accommodation. Ears, Nose, Mouth, and Throat no gross abnormality of ear auricles or external auditory canals. normal hearing noted during conversation. mucus membranes moist. Respiratory normal breathing without difficulty. clear to auscultation bilaterally. Cardiovascular 2+ dorsalis pedis/posterior tibialis pulses. 1+ pitting edema of the bilateral lower extremities. Gastrointestinal (GI) soft, non-tender, non-distended, +BS. no ventral hernia noted. Musculoskeletal normal gait and posture. Psychiatric this patient is able to make decisions and demonstrates good insight into disease process. Alert and Oriented x 3. pleasant and cooperative. Notes On evaluation today patient's left lateral lower extremity wound appears to be slough covered and there is not a good granulation bed noted at this point. There is not however any evidence of infection on inspection today. No red streaks extending from  the wound. He does have some mild discomfort to palpation but this one did require sharp debridement and he tolerated this well without complication. The wound bed was greatly improved post debridement. Electronic Signature(s) Signed: 03/18/2017 5:22:22 PM By: Lenda Kelp PA-C Entered By: Lenda Kelp on 03/18/2017 10:27:49 [name], [name] ([id number]) -------------------------------------------------------------------------------- Physician Orders Details Patient Name: [name], [name] Date of Service: 03/18/2017 8:15 AM Medical Record Number: [id number] Patient Account Number: [id number] Date of Birth/Sex: 1941/01/01 (76 y.o. Male) Treating RN: Clover Mealy, RN, BSN, [place] Sink Primary Care Provider: Windle Guard Other Clinician: Referring Provider: Windle Guard Treating Provider/Extender: Linwood Dibbles,  Weeks in Treatment: 0 Verbal / Phone Orders: No Diagnosis Coding Wound Cleansing Wound #1 Left,Lateral Lower Leg o Cleanse wound with mild soap and water o May shower with protection. - Use a cast protector or a large trash bag and tape it up o No tub bath. Anesthetic Wound #1 Left,Lateral Lower Leg o Topical Lidocaine 4% cream applied to wound bed prior to debridement Skin Barriers/Peri-Wound Care Wound #1 Left,Lateral Lower Leg o Barrier cream o Moisturizing lotion Primary Wound Dressing Wound #1 Left,Lateral Lower Leg o Aquacel Ag Secondary Dressing Wound #1 Left,Lateral Lower Leg o ABD pad Dressing Change Frequency Wound #1 Left,Lateral Lower Leg o Change dressing every week Follow-up Appointments Wound #1 Left,Lateral Lower Leg o Return Appointment in 1 week. Edema Control Wound #1 Left,Lateral Lower Leg o 3 Layer Compression System - Left Lower Extremity o Elevate legs to the level of the heart and pump ankles as often as possible [name], [name] ([id number]) Additional Orders / Instructions Wound #1 Left,Lateral Lower Leg o Increase  protein intake. o Activity as tolerated Services and Therapies o Arterial Studies- Unilateral - left leg o Venous Studies -Unilateral - left leg Notes Since patient has not had any lower extremity studies I am gonna order and arterial and venous study for the left lower extremity today. He has excellent pulses and so I did initiate a three layer compression wrap although patient was advised that if he had any complications with this he should contact our office as soon as possible. He tolerated debridement very well today. Fortunately the wound bed appears to be doing much better post debridement. We will continue with the current  wound care orders according to the above for the next week and see were things stand at that point. If anything worsens in the interim patient will contact our office for additional recommendations. Patient's daughter was seen during evaluation with him today as well. Electronic Signature(s) Signed: 03/18/2017 5:22:22 PM By: Lenda Kelp PA-C Entered By: Lenda Kelp on 03/18/2017 10:29:38 [name], [name] ([id number]) -------------------------------------------------------------------------------- Problem List Details Patient Name: Forness, Hazen L. Date of Service: 03/18/2017 8:15 AM Medical Record Number: [id number] Patient Account Number: [id number] Date of Birth/Sex: 18-Jan-1941 (76 y.o. 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 Date of Service: 03/18/2017 8:15 AM Medical Record Number: [id number] Patient Account Number: [id number] Date of Birth/Sex: 1941/08/23 (76 y.o. Male) Treating RN: Clover Mealy, RN, BSN, [place] Sink Primary Care Provider: Windle Guard Other Clinician: Referring Provider: Windle Guard Treating Provider/Extender: Linwood Dibbles,  Weeks in Treatment: 0 Subjective Chief Complaint Information obtained from Patient Left LE Ulcer History of Present Illness (HPI) 03/18/17 on evaluation today patient presents for initial visit concerning an ulcer he has on the left lateral lower extremity which has been present since mid February 2018. Unfortunately due to other family circumstances evaluation and treatment of this wound has been put on hold until this point when they could finally get him into be seen. He does have high blood pressure but has no history of diabetes though he does tell me that his cardiologist had been monitoring him for hyperglycemia and he is "borderline". He does have some discomfort in regard to this wound but fortunately this is not severe and typically with cleansing. His main concern is why this wound is not healing as in the past he has never had any difficulty with healing. The initial injury was during the period of time where he was helping a friend with some work and scrape the leg on a brick during that process. It has just never healed since that time. Patient is on chronic anticoagulant therapy and has not had any cardiovascular procedures such as arterial or venous imaging. Wound History Patient presents with 1 open wound that has been present for approximately 4 month. Patient has been treating wound in the following  manner: bactroban. Laboratory tests have not been performed in the last month. Patient reportedly has not tested positive for an antibiotic resistant organism. Patient reportedly has not tested positive for osteomyelitis. Patient reportedly has not had testing performed to evaluate circulation in the legs. Patient experiences the following problems associated with their wounds: infection. Patient History Information obtained from Patient, Caregiver. Allergies no Known allergies Family History Heart Disease - Father, Hypertension - Siblings, No family history of Cancer, Diabetes, Hereditary Spherocytosis, Kidney Disease, Lung Disease, Seizures, Stroke, Thyroid Problems, Tuberculosis. Social History AURELIUS, GILDERSLEEVE ([id number]) Never smoker, Marital Status - Married, Alcohol Use - Never, Drug Use - No History, Caffeine Use - Moderate. Medical History Eyes Patient has history of Cataracts - bilateral removal Ear/Nose/Mouth/Throat Denies history  of Chronic sinus problems/congestion, Middle ear problems Hematologic/Lymphatic Denies history of Anemia, Hemophilia, Human Immunodeficiency Virus, Lymphedema, Sickle Cell Disease Respiratory Denies history of Aspiration, Asthma, Chronic Obstructive Pulmonary Disease (COPD), Pneumothorax, Sleep Apnea, Tuberculosis Cardiovascular Patient has history of Arrhythmia, Hypertension, Peripheral Arterial Disease, Peripheral Venous Disease Gastrointestinal Denies history of Cirrhosis , Colitis, Crohn s, Hepatitis A, Hepatitis B, Hepatitis C Endocrine Denies history of Type I Diabetes, Type II Diabetes Genitourinary Denies history of End Stage Renal Disease Immunological Denies history of Lupus Erythematosus, Raynaud s, Scleroderma Integumentary (Skin) Denies history of History of Burn, History of pressure wounds Musculoskeletal Patient has history of Gout, Osteoarthritis Neurologic Denies history of Dementia, Neuropathy, Quadriplegia, Paraplegia,  Seizure Disorder Oncologic Denies history of Received Chemotherapy, Received Radiation Psychiatric Denies history of Anorexia/bulimia, Confinement Anxiety Medical And Surgical History Notes Ear/Nose/Mouth/Throat Hard of Hearing Review of Systems (ROS) Constitutional Symptoms (General Health) The patient has no complaints or symptoms. Eyes Complains or has symptoms of Glasses / Contacts. Ear/Nose/Mouth/Throat The patient has no complaints or symptoms. Hematologic/Lymphatic Complains or has symptoms of Bleeding / Clotting Disorders - on eliquis. Respiratory The patient has no complaints or symptoms. Cardiovascular Complains or has symptoms of LE edema. [place], JAREL CUADRA ([id number]) Gastrointestinal The patient has no complaints or symptoms. Endocrine The patient has no complaints or symptoms. Genitourinary The patient has no complaints or symptoms. Immunological The patient has no complaints or symptoms. Integumentary (Skin) Complains or has symptoms of Wounds, Swelling. Musculoskeletal The patient has no complaints or symptoms. Neurologic The patient has no complaints or symptoms. Oncologic The patient has no complaints or symptoms. Psychiatric The patient has no complaints or symptoms. Objective Constitutional sitting or standing blood pressure is within target range for patient.. pulse regular and within target range for patient.[name] respirations regular, non-labored and within target range for patient.[name] temperature within target range for patient.. Well-nourished and well-hydrated in no acute distress. Vitals Time Taken: 8:22 AM, Height: 72 in, Source: Stated, Weight: 226 lbs, Source: Stated, BMI: 30.6, Temperature: 98.2 F, Pulse: 61 bpm, Respiratory Rate: 16 breaths/min, Blood Pressure: 117/62 mmHg. Eyes conjunctiva clear no eyelid edema noted. pupils equal round and reactive to light and accommodation. Ears, Nose, Mouth, and Throat no gross abnormality of ear  auricles or external auditory canals. normal hearing noted during conversation. mucus membranes moist. Respiratory normal breathing without difficulty. clear to auscultation bilaterally. Cardiovascular 2+ dorsalis pedis/posterior tibialis pulses. 1+ pitting edema of the bilateral lower extremities. [name], [name] ([id number]) Gastrointestinal (GI) soft, non-tender, non-distended, +BS. no ventral hernia noted. Musculoskeletal normal gait and posture. Psychiatric this patient is able to make decisions and demonstrates good insight into disease process. Alert and Oriented x 3. pleasant and cooperative. General Notes: On evaluation today patient's left lateral lower extremity wound appears to be slough covered and there is not a good granulation bed noted at this point. There is not however any evidence of infection on inspection today. No red streaks extending from the wound. He does have some mild discomfort to palpation but this one did require sharp debridement and he tolerated this well without complication. The wound bed was greatly improved post debridement. Integumentary (Hair, Skin) Wound #1 status is Open. Original cause of wound was Gradually Appeared. The wound is located on the Left,Lateral Lower Leg. The wound measures 2.5cm length x 2.5cm width x 0.2cm depth; 4.909cm^2 area and 0.982cm^3 volume. There is Fat Layer (Subcutaneous Tissue) Exposed exposed. There is no tunneling or undermining noted. There is a medium amount of serosanguineous drainage  noted. The wound margin is flat and intact. There is small (1-33%) pink granulation within the wound bed. There is a medium (34-66%) amount of necrotic tissue within the wound bed including Adherent Slough. The periwound skin appearance exhibited: Hemosiderin Staining, Mottled. Periwound temperature was noted as No Abnormality. The periwound has tenderness on palpation. 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 There was a Skin/Subcutaneous Tissue Debridement ([id number]) debridement with total area of 6.25 sq cm [name], [name] ([id number]) performed by STONE III,  E., PA-C. with the following instrument(s): Curette to remove Non-Viable tissue/material including Exudate, Fat Layer (and Subcutaneous Tissue) Exposed, Fibrin/Slough, and Subcutaneous after achieving pain control using Lidocaine 4% Topical Solution. A time out was conducted at 08:54, prior to the start of the procedure. A Moderate amount of bleeding was controlled with Pressure. The procedure was tolerated well with a pain level of 0 throughout and a pain level of 0 following the procedure. Post Debridement Measurements: 2.5cm length x 2.5cm width x 0.2cm depth; 0.982cm^3 volume. Character of Wound/Ulcer Post Debridement is stable. Post procedure Diagnosis Wound #1: Same as Pre-Procedure Plan Wound Cleansing: Wound #1 Left,Lateral Lower Leg: Cleanse wound with mild soap and water May shower with protection. - Use a cast protector or a large trash bag and tape it up No tub bath. Anesthetic: Wound #1 Left,Lateral Lower Leg: Topical Lidocaine 4% cream applied to wound bed prior to debridement Skin Barriers/Peri-Wound Care: Wound #1 Left,Lateral Lower Leg: Barrier cream Moisturizing lotion Primary Wound Dressing: Wound #1 Left,Lateral Lower Leg: Aquacel Ag Secondary Dressing: Wound #1 Left,Lateral Lower Leg: ABD pad Dressing Change Frequency: Wound #1 Left,Lateral Lower Leg: Change dressing every week Follow-up Appointments: Wound #1 Left,Lateral Lower Leg: Return Appointment in 1 week. Edema Control: Wound #1 Left,Lateral Lower Leg: 3  Layer Compression System - Left Lower Extremity Elevate legs to the level of the heart and pump ankles as often as possible Additional Orders / Instructions: Wound #1 Left,Lateral Lower Leg: Increase protein intake. Activity as tolerated Services and Therapies ordered were: [name], [name] ([id number]) Arterial Studies- Unilateral - left leg, Venous Studies -Unilateral - left leg General Notes: Since patient has not had any lower extremity studies I am gonna order and arterial and venous study for the left lower extremity today. He has excellent pulses and so I did initiate a three layer compression wrap although patient was advised that if he had any complications with this he should contact our office as soon as possible. He tolerated debridement very well today. Fortunately the wound bed appears to be doing much better post debridement. We will continue with the current wound care orders according to the above for the next week and see were things stand at that point. If anything worsens in the interim patient will contact our office for additional recommendations. Patient's daughter was seen during evaluation with him today as well. Electronic Signature(s) Signed: 03/18/2017 5:22:22 PM By: Lenda Kelp PA-C Entered By: Lenda Kelp on 03/18/2017 10:30:02 [name], [name] ([id number]) -------------------------------------------------------------------------------- ROS/PFSH Details Patient Name: James Deed L. Date of Service: 03/18/2017 8:15 AM Medical Record Number: [id number] Patient Account Number: [id number] Date of Birth/Sex: 1941-02-18 (76 y.o. Male) Treating RN: Clover Mealy, RN, BSN, [place] Primary Care Provider: Windle Guard  Other Clinician: Referring Provider: Windle Guard Treating Provider/Extender: STONE III,  Weeks in Treatment: 0 Information Obtained From Patient Caregiver Wound History Do you currently have one or more open woundso Yes How many open wounds do  you currently haveo 1 Approximately how long have you had your woundso 4 month How have you been treating your wound(s) until nowo bactroban Has your wound(s) ever healed and then re-openedo No Have you had any lab work done in the past montho No Have you tested positive for an antibiotic resistant organism (MRSA, VRE)o No Have you tested positive for osteomyelitis (bone infection)o No Have you had any tests for circulation on your legso No Have you had other problems associated with your woundso Infection Eyes Complaints and Symptoms: Positive for: Glasses / Contacts Medical History: Positive for: Cataracts - bilateral removal Hematologic/Lymphatic Complaints and Symptoms: Positive for: Bleeding / Clotting Disorders - on eliquis Medical History: Negative for: Anemia; Hemophilia; Human Immunodeficiency Virus; Lymphedema; Sickle Cell Disease Cardiovascular Complaints and Symptoms: Positive for: LE edema Medical History: Positive for: Arrhythmia; Hypertension; Peripheral Arterial Disease; Peripheral Venous Disease Integumentary (Skin) Complaints and Symptoms: Positive for: Wounds; Swelling [place], Norvil L. ([id number]) Medical History: Negative for: History of Burn; History of pressure wounds Constitutional Symptoms (General Health) Complaints and Symptoms: No Complaints or Symptoms Ear/Nose/Mouth/Throat Complaints and Symptoms: No Complaints or Symptoms Medical History: Negative for: Chronic sinus problems/congestion; Middle ear problems Past Medical History Notes: Hard of Hearing Respiratory Complaints and Symptoms: No Complaints or Symptoms Medical History: Negative for: Aspiration; Asthma; Chronic Obstructive Pulmonary Disease (COPD); Pneumothorax; Sleep Apnea; Tuberculosis Gastrointestinal Complaints and Symptoms: No Complaints or Symptoms Medical History: Negative for: Cirrhosis ; Colitis; Crohnos; Hepatitis A; Hepatitis B; Hepatitis C Endocrine Complaints and  Symptoms: No Complaints or Symptoms Medical History: Negative for: Type I Diabetes; Type II Diabetes Genitourinary Complaints and Symptoms: No Complaints or Symptoms Medical History: Negative for: End Stage Renal Disease [place], Aubert L. ([id number]) Immunological Complaints and Symptoms: No Complaints or Symptoms Medical History: Negative for: Lupus Erythematosus; Raynaudos; Scleroderma Musculoskeletal Complaints and Symptoms: No Complaints or Symptoms Medical History: Positive for: Gout; Osteoarthritis Neurologic Complaints and Symptoms: No Complaints or Symptoms Medical History: Negative for: Dementia; Neuropathy; Quadriplegia; Paraplegia; Seizure Disorder Oncologic Complaints and Symptoms: No Complaints or Symptoms Medical History: Negative for: Received Chemotherapy; Received Radiation Psychiatric Complaints and Symptoms: No Complaints or Symptoms Medical History: Negative for: Anorexia/bulimia; Confinement Anxiety HBO Extended History Items Eyes: Cataracts Immunizations Pneumococcal Vaccine: Received Pneumococcal Vaccination: No Family and Social History Cancer: No; Diabetes: No; Heart Disease: Yes - Father; Hereditary Spherocytosis: No; Hypertension: Yes - Siblings; Kidney Disease: No; Lung Disease: No; Seizures: No; Stroke: No; Thyroid Problems: No; Tuberculosis: No; Never smoker; Marital Status - Married; Alcohol Use: Never; Drug Use: No History; Cadieux, Dakarri L. ([id number]) Caffeine Use: Moderate; Financial Concerns: No; Food, Clothing or Shelter Needs: No; Support System Lacking: No; Transportation Concerns: No; Advanced Directives: No; Patient does not want information on Advanced Directives; Living Will: No Electronic Signature(s) Signed: 03/18/2017 11:52:37 AM By: Elpidio Eric BSN, RN Signed: 03/18/2017 5:22:22 PM By: Lenda Kelp PA-C Entered By: Elpidio Eric on 03/18/2017 08:55:13 Baum, [name]  ([id number]) -------------------------------------------------------------------------------- SuperBill Details Patient Name: Hedger, Khaiden L. Date of Service: 03/18/2017 Medical Record Number: [id number] Patient Account Number: [id number] Date of Birth/Sex: 06/26/1941 (76 y.o. 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 I10 Essential (primary) hypertension Facility Procedures CPT4: Description Modifier Quantity Code 9562130876100138 99213 - WOUND CARE VISIT-LEV 3 EST PT 1 CPT4: 6578469636100012 11042 - DEB SUBQ TISSUE 20 SQ CM/< 1 ICD-10 Description Diagnosis L97.822 Non-pressure chronic ulcer of other part of left lower leg with fat layer exposed Physician Procedures CPT4: Description Modifier Quantity Code [id number] WC PHYS LEVEL 3 o NEW PT 1 ICD-10 Description Diagnosis 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 ([id number]) Electronic Signature(s) Signed: 03/18/2017 5:22:22 PM By: Lenda KelpStone III,  PA-C Entered By: Lenda KelpStone III,  on 03/18/2017 10:30:33

## 2017-03-20 NOTE — Progress Notes (Signed)
MAYO, FAULK ([id number]) Visit Report for 03/18/2017 Allergy List Details Patient Name: [name], [name]. Date of Service: 03/18/2017 8:15 AM Medical Record Number: [id number] Patient Account Number: [id number] Date of Birth/Sex: 1941-01-20 (76 y.o. Male) Treating RN: Clover Mealy, RN, BSN, [place] Sink Primary Care Ermine Spofford: Windle Guard Other Clinician: Referring Jatia Musa: Windle Guard Treating Danice Dippolito/Extender: STONE III, HOYT Weeks in Treatment: 0 Allergies Active Allergies no Known allergies Allergy Notes Electronic Signature(s) Signed: 03/18/2017 11:52:37 AM By: Elpidio Eric BSN, RN Entered By: Elpidio Eric on 03/18/2017 08:27:50 Murata, [name] ([id number]) -------------------------------------------------------------------------------- Arrival Information Details Patient Name: [name]. Date of Service: 03/18/2017 8:15 AM Medical Record Number: [id number] Patient Account Number: [id number] Date of Birth/Sex: 1940/09/22 (76 y.o. Male) Treating RN: Clover Mealy, RN, BSN, [place] Sink Primary Care Leondre Taul: Windle Guard Other Clinician: Referring Dlisa Barnwell: Windle Guard Treating Shirah Roseman/Extender: Linwood Dibbles, HOYT Weeks in Treatment: 0 Visit Information Patient Arrived: Ambulatory Arrival Time: 08:20 Accompanied By: self Transfer Assistance: None Patient Identification Verified: Yes Secondary Verification Process Yes Completed: Patient Has Alerts: Yes Patient Alerts: Patient on Blood Thinner ELIQUIS Electronic Signature(s) Signed: 03/18/2017 11:52:37 AM By: Elpidio Eric BSN, RN Entered By: Elpidio Eric on 03/18/2017 08:22:06 Thier, [name] ([id number]) -------------------------------------------------------------------------------- Clinic Level of Care Assessment Details Patient Name: [name], [name]. Date of Service: 03/18/2017 8:15 AM Medical Record Number: [id number] Patient Account Number: [id number] Date of Birth/Sex: April 22, 1941 (76 y.o. Male) Treating RN: Clover Mealy, RN, BSN,   Sink Primary Care Evelio Rueda: Windle Guard Other Clinician: Referring Ajani Rineer: Windle Guard Treating Tommaso Cavitt/Extender: Linwood Dibbles, HOYT Weeks in Treatment: 0 Clinic Level of Care Assessment Items TOOL 1 Quantity Score []  - Use when EandM and Procedure is performed on INITIAL visit 0 ASSESSMENTS - Nursing Assessment / Reassessment X - General Physical Exam (combine w/ comprehensive assessment (listed just 1 20 below) when performed on new pt. evals) X - Comprehensive Assessment (HX, ROS, Risk Assessments, Wounds Hx, etc.) 1 25 ASSESSMENTS - Wound and Skin Assessment / Reassessment []  - Dermatologic / Skin Assessment (not related to wound area) 0 ASSESSMENTS - Ostomy and/or Continence Assessment and Care []  - Incontinence Assessment and Management 0 []  - Ostomy Care Assessment and Management (repouching, etc.) 0 PROCESS - Coordination of Care X - Simple Patient / Family Education for ongoing care 1 15 []  - Complex (extensive) Patient / Family Education for ongoing care 0 X - Staff obtains Consents, Records, Test Results / Process Orders 1 10 []  - Staff telephones HHA, Nursing Homes / Clarify orders / etc 0 []  - Routine Transfer to another Facility (non-emergent condition) 0 []  - Routine Hospital Admission (non-emergent condition) 0 X - New Admissions / Manufacturing engineer / Ordering NPWT, Apligraf, etc. 1 15 []  - Emergency Hospital Admission (emergent condition) 0 PROCESS - Special Needs []  - Pediatric / Minor Patient Management 0 []  - Isolation Patient Management 0 [name], [name]. ([id number]) []  - Hearing / Language / Visual special needs 0 []  - Assessment of Community assistance (transportation, D/C planning, etc.) 0 []  - Additional assistance / Altered mentation 0 []  - Support Surface(s) Assessment (bed, cushion, seat, etc.) 0 INTERVENTIONS - Miscellaneous []  - External ear exam 0 []  - Patient Transfer (multiple staff / Nurse, adult / Similar devices) 0 []  - Simple  Staple / Suture removal (25 or less) 0 []  - Complex Staple / Suture removal (26 or more) 0 []  - Hypo/Hyperglycemic Management (do not check if billed separately) 0 X - Ankle / Brachial Index (ABI) - do not check if billed separately 1 15  Has the patient been seen at the hospital within the last three years: Yes Total Score: 100 Level Of Care: New/Established - Level 3 Electronic Signature(s) Signed: 03/18/2017 11:52:37 AM By: Elpidio Eric BSN, RN Entered By: Elpidio Eric on 03/18/2017 09:02:21 Tietje, [name] ([id number]) -------------------------------------------------------------------------------- Encounter Discharge Information Details Patient Name: Mancia, [name]. Date of Service: 03/18/2017 8:15 AM Medical Record Number: [id number] Patient Account Number: [id number] Date of Birth/Sex: 1941-06-25 (76 y.o. Male) Treating RN: Clover Mealy, RN, BSN, [place] Sink Primary Care Emersyn Kotarski: Windle Guard Other Clinician: Referring Ivana Nicastro: Windle Guard Treating Brigetta Beckstrom/Extender: Linwood Dibbles, HOYT Weeks in Treatment: 0 Encounter Discharge Information Items Discharge Pain Level: 0 Discharge Condition: Stable Ambulatory Status: Ambulatory Discharge Destination: Home Private Transportation: Auto Accompanied By: dtr Schedule Follow-up Appointment: No Medication Reconciliation completed and No provided to Patient/Care Roselani Grajeda: Clinical Summary of Care: Electronic Signature(s) Signed: 03/18/2017 11:52:37 AM By: Elpidio Eric BSN, RN Previous Signature: 03/18/2017 9:20:35 AM Version By: Gwenlyn Perking Entered By: Elpidio Eric on 03/18/2017 09:21:47 Shams, [name] ([id number]) -------------------------------------------------------------------------------- General Visit Notes Details Patient Name: [name], [name]. Date of Service: 03/18/2017 8:15 AM Medical Record Number: [id number] Patient Account Number: [id number] Date of Birth/Sex: 1941-07-14 (76 y.o. Male) Treating RN: Clover Mealy, RN, BSN, [place] Sink Primary  Care Andry Bogden: Windle Guard Other Clinician: Referring Evagelia Knack: Windle Guard Treating Kenyon Eshleman/Extender: Linwood Dibbles, HOYT Weeks in Treatment: 0 Notes patient in today presenting traumatic wound since February hitting it to a brick. He taken 2 rounds of antibiotics, currently cleaning it with normal saline and applying bactroban. Electronic Signature(s) Signed: 03/18/2017 11:52:37 AM By: Elpidio Eric BSN, RN Entered By: Elpidio Eric on 03/18/2017 08:58:04 Whitwell, [name] ([id number]) -------------------------------------------------------------------------------- Lower Extremity Assessment Details Patient Name: [name], [name]. Date of Service: 03/18/2017 8:15 AM Medical Record Number: [id number] Patient Account Number: [id number] Date of Birth/Sex: Jan 22, 1941 (76 y.o. Male) Treating RN: Clover Mealy, RN, BSN, [place] Sink Primary Care Chrishawn Boley: Windle Guard Other Clinician: Referring Jeiden Daughtridge: Windle Guard Treating Dariusz Brase/Extender: Linwood Dibbles, HOYT Weeks in Treatment: 0 Vascular Assessment Claudication: Claudication Assessment [Left:None] Pulses: Dorsalis Pedis Palpable: [Left:Yes] Doppler Audible: [Left:Yes] Posterior Tibial Extremity colors, hair growth, and conditions: Extremity Color: [Left:Mottled] Hair Growth on Extremity: [Left:Yes] Temperature of Extremity: [Left:Warm] Capillary Refill: [Left:< 3 seconds] Blood Pressure: Brachial: [Left:117] Dorsalis Pedis: 138 [Left:Dorsalis Pedis:] Ankle: Posterior Tibial: 162 [Left:Posterior Tibial: 1.38] Toe Nail Assessment Left: Right: Thick: Yes Discolored: No Deformed: No Improper Length and Hygiene: No Electronic Signature(s) Signed: 03/18/2017 11:52:37 AM By: Elpidio Eric BSN, RN Entered By: Elpidio Eric on 03/18/2017 08:38:31 Hardrick, [name] ([id number]) -------------------------------------------------------------------------------- Multi Wound Chart Details Patient Name: [name], [name]. Date of Service: 03/18/2017 8:15  AM Medical Record Number: [id number] Patient Account Number: [id number] Date of Birth/Sex: [birth year] (76 y.o. Male) Treating RN: Clover Mealy, RN, BSN, [place] Sink Primary Care Chaniah Cisse: Windle Guard Other Clinician: Referring Reginia Battie: Windle Guard Treating Laden Fieldhouse/Extender: STONE III, HOYT Weeks in Treatment: 0 Vital Signs Height(in): 72 Pulse(bpm): 61 Weight(lbs): 226 Blood Pressure 117/62 (mmHg): Body Mass Index(BMI): 31 Temperature(F): 98.2 Respiratory Rate 16 (breaths/min): Photos: [1:No Photos] [N/A:N/A] Wound Location: [1:Left Lower Leg - Lateral] [N/A:N/A] Wounding Event: [1:Gradually Appeared] [N/A:N/A] Primary Etiology: [1:Trauma, Other] [N/A:N/A] Date Acquired: [1:10/13/2016] [N/A:N/A] Weeks of Treatment: [1:0] [N/A:N/A] Wound Status: [1:Open] [N/A:N/A] Measurements [name] x W x D 2.5x2.5x0.2 [N/A:N/A] (cm) Area (cm) : [1:4.909] [N/A:N/A] Volume (cm) : [1:0.982] [N/A:N/A] Classification: [1:Full Thickness Without Exposed Support Structures] [N/A:N/A] Exudate Amount: [1:Medium] [N/A:N/A] Exudate Type: [1:Serosanguineous] [N/A:N/A] Exudate Color: [1:red, brown] [N/A:N/A] Wound Margin: [1:Flat and Intact] [N/A:N/A] Granulation Amount: [1:Small (1-33%)] [N/A:N/A] Granulation  Quality: [1:Pink] [N/A:N/A] Necrotic Amount: [1:Medium (34-66%)] [N/A:N/A] Exposed Structures: [1:Fat Layer (Subcutaneous Tissue) Exposed: Yes Fascia: No Tendon: No Muscle: No Joint: No Bone: No] [N/A:N/A] Epithelialization: [1:None] [N/A:N/A] Periwound Skin Texture: No Abnormalities Noted [N/A:N/A] Periwound Skin No Abnormalities Noted N/A N/A Moisture: Periwound Skin Color: Hemosiderin Staining: Yes N/A N/A Mottled: Yes Temperature: No Abnormality N/A N/A Tenderness on Yes N/A N/A Palpation: Wound Preparation: Ulcer Cleansing: N/A N/A Rinsed/Irrigated with Saline Topical Anesthetic Applied: Other: lidocaine 4% Treatment Notes Electronic Signature(s) Signed: 03/18/2017 11:52:37 AM By:  Elpidio Eric BSN, RN Entered By: Elpidio Eric on 03/18/2017 08:58:28 Otero, [name] ([id number]) -------------------------------------------------------------------------------- Multi-Disciplinary Care Plan Details Patient Name: [name], [name]. Date of Service: 03/18/2017 8:15 AM Medical Record Number: [id number] Patient Account Number: [id number] Date of Birth/Sex: Jun 02, 1941 (76 y.o. Male) Treating RN: Clover Mealy, RN, BSN, [place] Sink Primary Care Vearl Aitken: Windle Guard Other Clinician: Referring Lorenso Quirino: Windle Guard Treating Gen Clagg/Extender: Linwood Dibbles, HOYT Weeks in Treatment: 0 Active Inactive ` Orientation to the Wound Care Program Nursing Diagnoses: Knowledge deficit related to the wound healing center program Goals: Patient/caregiver will verbalize understanding of the Wound Healing Center Program Date Initiated: 03/18/2017 Target Resolution Date: 07/19/2017 Goal Status: Active Interventions: Provide education on orientation to the wound center Notes: ` Wound/Skin Impairment Nursing Diagnoses: Impaired tissue integrity Knowledge deficit related to ulceration/compromised skin integrity Goals: Patient/caregiver will verbalize understanding of skin care regimen Date Initiated: 03/18/2017 Target Resolution Date: 07/19/2017 Goal Status: Active Ulcer/skin breakdown will have a volume reduction of 30% by week 4 Date Initiated: 03/18/2017 Target Resolution Date: 07/19/2017 Goal Status: Active Ulcer/skin breakdown will have a volume reduction of 50% by week 8 Date Initiated: 03/18/2017 Target Resolution Date: 07/19/2017 Goal Status: Active Ulcer/skin breakdown will have a volume reduction of 80% by week 12 Date Initiated: 03/18/2017 Target Resolution Date: 07/19/2017 Goal Status: Active Ulcer/skin breakdown will heal within 14 weeks CHANDLOR, NOECKER ([id number]) Date Initiated: 03/18/2017 Target Resolution Date: 07/19/2017 Goal Status: Active Interventions: Assess  patient/caregiver ability to perform ulcer/skin care regimen upon admission and as needed Treatment Activities: Topical wound management initiated : 03/18/2017 Notes: Electronic Signature(s) Signed: 03/18/2017 11:52:06 AM By: Elpidio Eric BSN, RN Entered By: Elpidio Eric on 03/18/2017 11:52:06 Shrestha, [name] ([id number]) -------------------------------------------------------------------------------- Pain Assessment Details Patient Name: [name], [name]. Date of Service: 03/18/2017 8:15 AM Medical Record Number: [id number] Patient Account Number: [id number] Date of Birth/Sex: 25-May-1941 (76 y.o. Male) Treating RN: Clover Mealy, RN, BSN, [place] Sink Primary Care Connelly [place]: Windle Guard Other Clinician: Referring Rosco Harriott: Windle Guard Treating Dawnielle Christiana/Extender: Linwood Dibbles, HOYT Weeks in Treatment: 0 Active Problems Location of Pain Severity and Description of Pain Patient Has Paino No Site Locations With Dressing Change: No Pain Management and Medication Current Pain Management: Electronic Signature(s) Signed: 03/18/2017 11:52:37 AM By: Elpidio Eric BSN, RN Entered By: Elpidio Eric on 03/18/2017 08:22:17 Noga, [name] ([id number]) -------------------------------------------------------------------------------- Patient/Caregiver Education Details Patient Name: [name], [name]. Date of Service: 03/18/2017 8:15 AM Medical Record Number: [id number] Patient Account Number: [id number] Date of Birth/Gender: 1941/06/27 (76 y.o. Male) Treating RN: Clover Mealy, RN, BSN, [place] Sink Primary Care Physician: Windle Guard Other Clinician: Referring Physician: Windle Guard Treating Physician/Extender: Linwood Dibbles, HOYT Weeks in Treatment: 0 Education Assessment Education Provided To: Patient and Caregiver Materials engineer Education Topics Provided Basic Hygiene: Methods: Explain/Verbal Responses: State content correctly Wound Debridement: Methods: Explain/Verbal Responses: State content correctly Wound/Skin  Impairment: Methods: Explain/Verbal Responses: State content correctly Electronic Signature(s) Signed: 03/18/2017 11:52:37 AM By: Elpidio Eric BSN, RN Entered By: Elpidio Eric on 03/18/2017 09:22:37 [name], James Burdock  [name]. ([id number]) -------------------------------------------------------------------------------- Wound Assessment Details Patient Name: [name], [name]. Date of Service: 03/18/2017 8:15 AM Medical Record Number: [id number] Patient Account Number: [id number] Date of Birth/Sex: 1941-06-10 [age] y.o. Male) Treating RN: Clover MealyAfful, RN, BSN, Rita Primary Care Delcenia Inman: Windle GuardELKINS, WILSON Other Clinician: Referring Kiyla Ringler: Windle GuardELKINS, WILSON Treating Brylan Dec/Extender: STONE III, HOYT Weeks in Treatment: 0 Wound Status Wound Number: 1 Primary Etiology: Trauma, Other Wound Location: Left Lower Leg - Lateral Wound Status: Open Wounding Event: Gradually Appeared Date Acquired: 10/13/2016 Weeks Of Treatment: 0 Clustered Wound: No Photos Photo Uploaded By: Elpidio EricAfful, Rita on 03/18/2017 11:49:19 Wound Measurements Length: (cm) 2.5 Width: (cm) 2.5 Depth: (cm) 0.2 Area: (cm) 4.909 Volume: (cm) 0.982 % Reduction in Area: % Reduction in Volume: Epithelialization: None Tunneling: No Undermining: No Wound Description Full Thickness Without Exposed Classification: Support Structures Wound Margin: Flat and Intact Exudate Medium Amount: [name], [name]. ([id number]) Foul Odor After Cleansing: No Slough/Fibrino Yes Exudate Type: Serosanguineous Exudate Color: red, brown Wound Bed Granulation Amount: Small (1-33%) Exposed Structure Granulation Quality: Pink Fascia Exposed: No Necrotic Amount: Medium (34-66%) Fat Layer (Subcutaneous Tissue) Exposed: Yes Necrotic Quality: Adherent Slough Tendon Exposed: No Muscle Exposed: No Joint Exposed: No Bone Exposed: No Periwound Skin Texture Texture Color No Abnormalities Noted: No No Abnormalities Noted: No Hemosiderin Staining:  Yes Moisture Mottled: Yes No Abnormalities Noted: No Temperature / Pain Temperature: No Abnormality Tenderness on Palpation: Yes Wound Preparation Ulcer Cleansing: Rinsed/Irrigated with Saline Topical Anesthetic Applied: Other: lidocaine 4%, Treatment Notes Wound #1 (Left, Lateral Lower Leg) 1. Cleansed with: Cleanse wound with antibacterial soap and water 3. Peri-wound Care: Barrier cream Moisturizing lotion 4. Dressing Applied: Aquacel Ag 5. Secondary Dressing Applied ABD Pad 7. Secured with 3 Layer Compression System - Left Lower Extremity Electronic Signature(s) Signed: 03/18/2017 11:52:37 AM By: Elpidio EricAfful, Rita BSN, RN Entered By: Elpidio EricAfful, Rita on 03/18/2017 08:33:00 [name], James LangoICHARD [name]. ([id number]) -------------------------------------------------------------------------------- Vitals Details Patient Name: Murray, Myrtle [name]. Date of Service: 03/18/2017 8:15 AM Medical Record Number: [id number] Patient Account Number: [id number] Date of Birth/Sex: 1941-06-10 [age] y.o. Male) Treating RN: Afful, RN, BSN, Rita Primary Care Kashmir Leedy: Windle GuardELKINS, WILSON Other Clinician: Referring Kashmir Lysaght: Windle GuardELKINS, WILSON Treating Alanys Godino/Extender: STONE III, HOYT Weeks in Treatment: 0 Vital Signs Time Taken: 08:22 Temperature (F): 98.2 Height (in): 72 Pulse (bpm): 61 Source: Stated Respiratory Rate (breaths/min): 16 Weight (lbs): 226 Blood Pressure (mmHg): 117/62 Source: Stated Reference Range: 80 - 120 mg / dl Body Mass Index (BMI): 30.6 Electronic Signature(s) Signed: 03/18/2017 11:52:37 AM By: Elpidio EricAfful, Rita BSN, RN Entered By: Elpidio EricAfful, Rita on 03/18/2017 24:40:1008:26:34

## 2017-03-25 ENCOUNTER — Encounter: Payer: Medicare Other | Admitting: Surgery

## 2017-03-25 DIAGNOSIS — L97822 Non-pressure chronic ulcer of other part of left lower leg with fat layer exposed: Secondary | ICD-10-CM | POA: Diagnosis not present

## 2017-03-26 NOTE — Progress Notes (Addendum)
ESDRAS, DELAIR ([id number]) Visit Report James 03/25/2017 Arrival Information Details Patient Name: [name], [name]. Date of Service: 03/25/2017 8:15 AM Medical Record Number: [id number] Patient Account Number: [id number] Date of Birth/Sex: 1941/06/17 (76 [name].o. Male) Treating RN: Clover Mealy, RN, BSN, [place] Sink Primary Care Cecelia Graciano: Windle Guard Other Clinician: Referring Addis Tuohy: Windle Guard Treating Halcyon Heck/Extender: Rudene Re in Treatment: 1 Visit Information History Since Last Visit All ordered tests and consults were completed: No Patient Arrived: Ambulatory Added or deleted any medications: No Arrival Time: 08:19 Any new allergies or adverse reactions: No Accompanied By: dtr Had a fall or experienced change in No Transfer Assistance: None activities of daily living that may affect Patient Identification Verified: Yes risk of falls: Secondary Verification Process Yes Hospitalized since last visit: No Completed: Has Dressing in Place as Prescribed: Yes Patient Has Alerts: Yes Has Compression in Place as Prescribed: Yes Patient Alerts: Patient on Blood Pain Present Now: No Thinner ELIQUIS Electronic Signature(s) Signed: 03/25/2017 2:56:30 PM By: Elpidio Eric BSN, RN Entered By: Elpidio Eric on 03/25/2017 08:23:14 [name], [name] ([id number]) -------------------------------------------------------------------------------- Encounter Discharge Information Details Patient Name: [name], [name] Date of Service: 03/25/2017 8:15 AM Medical Record Number: [id number] Patient Account Number: [id number] Date of Birth/Sex: 1941-01-23 (76 [name].o. Male) Treating RN: Clover Mealy, RN, BSN, [place] Sink Primary Care Jaqua Ching: Windle Guard Other Clinician: Referring Jin Shockley: Windle Guard Treating Yovanny Coats/Extender: Rudene Re in Treatment: 1 Encounter Discharge Information Items Discharge Pain Level: 0 Discharge Condition: Stable Ambulatory Status: Ambulatory Discharge Destination:  Home Transportation: Private Auto Accompanied By: dtr Schedule Follow-up Appointment: No Medication Reconciliation completed and provided to Patient/Care No Ladd Cen: Provided on Clinical Summary of Care: 03/25/2017 Form Type Recipient Paper Patient [facility] Electronic Signature(s) Signed: 03/25/2017 8:54:16 AM By: Gwenlyn Perking Entered By: Gwenlyn Perking on 03/25/2017 08:54:16 [name], [name] ([id number]) -------------------------------------------------------------------------------- Lower Extremity Assessment Details Patient Name: [name], [name] Date of Service: 03/25/2017 8:15 AM Medical Record Number: [id number] Patient Account Number: [id number] Date of Birth/Sex: Jul 28, 1941 (76 [name].o. Male) Treating RN: Clover Mealy, RN, BSN, [place] Sink Primary Care Anasophia Pecor: Windle Guard Other Clinician: Referring Ember Henrikson: Windle Guard Treating Sandrina Heaton/Extender: Rudene Re in Treatment: 1 Vascular Assessment Claudication: Claudication Assessment [Left:None] Pulses: Dorsalis Pedis Palpable: [Left:Yes] Posterior Tibial Extremity colors, hair growth, and conditions: Extremity Color: [Left:Mottled] Hair Growth on Extremity: [Left:Yes] Temperature of Extremity: [Left:Warm] Capillary Refill: [Left:< 3 seconds] Toe Nail Assessment Left: Right: Thick: Yes Discolored: No Deformed: No Improper Length and Hygiene: No Electronic Signature(s) Signed: 03/25/2017 8:18:11 AM By: Elpidio Eric BSN, RN Entered By: Elpidio Eric on 03/25/2017 08:18:11 [name], [name] ([id number]) -------------------------------------------------------------------------------- Multi Wound Chart Details Patient Name: [name], [name] Date of Service: 03/25/2017 8:15 AM Medical Record Number: [id number] Patient Account Number: [id number] Date of Birth/Sex: 11-18-1940 (76 [name].o. Male) Treating RN: Clover Mealy, RN, BSN, [place] Sink Primary Care Daune Colgate: Windle Guard Other Clinician: Referring Carliss Porcaro: Windle Guard Treating  Kennen Stammer/Extender: Rudene Re in Treatment: 1 Vital Signs Height(in): 72 Pulse(bpm): 64 Weight(lbs): 226 Blood Pressure 111/58 (mmHg): Body Mass Index(BMI): 31 Temperature(F): 98.2 Respiratory Rate 16 (breaths/min): Photos: [1:No Photos] [N/A:N/A] Wound Location: [1:Left Lower Leg - Lateral] [N/A:N/A] Wounding Event: [1:Gradually Appeared] [N/A:N/A] Primary Etiology: [1:Trauma, Other] [N/A:N/A] Comorbid History: [1:Cataracts, Arrhythmia, Hypertension, Peripheral Arterial Disease, Peripheral Venous Disease, Gout, Osteoarthritis] [N/A:N/A] Date Acquired: [1:10/13/2016] [N/A:N/A] Weeks of Treatment: [1:1] [N/A:N/A] Wound Status: [1:Open] [N/A:N/A] Measurements L x W x D 2.2x3x0.2 [N/A:N/A] (cm) Area (cm) : [1:5.184] [N/A:N/A] Volume (cm) : [1:1.037] [N/A:N/A] % Reduction in Area: [1:-5.60%] [N/A:N/A] % Reduction in Volume: -5.60% [N/A:N/A] Classification: [  1:Full Thickness Without Exposed Support Structures] [N/A:N/A] Exudate Amount: [1:Medium] [N/A:N/A] Exudate Type: [1:Serosanguineous] [N/A:N/A] Exudate Color: [1:red, brown] [N/A:N/A] Wound Margin: [1:Flat and Intact] [N/A:N/A] Granulation Amount: [1:Small (1-33%)] [N/A:N/A] Granulation Quality: [1:Pink] [N/A:N/A] Necrotic Amount: [1:Medium (34-66%)] [N/A:N/A] Exposed Structures: [N/A:N/A] Fat Layer (Subcutaneous Tissue) Exposed: Yes Fascia: No Tendon: No Muscle: No Joint: No Bone: No Epithelialization: None N/A N/A Debridement: Debridement ([id number](11042- N/A N/A 11047) Pre-procedure 08:41 N/A N/A Verification/Time Out Taken: Pain Control: Lidocaine 4% Topical N/A N/A Solution Tissue Debrided: Fibrin/Slough, Fat, N/A N/A Subcutaneous Level: Skin/Subcutaneous N/A N/A Tissue Debridement Area (sq 4.4 N/A N/A cm): Instrument: Curette N/A N/A Bleeding: Minimum N/A N/A Hemostasis Achieved: Pressure N/A N/A Procedural Pain: 0 N/A N/A Post Procedural Pain: 0 N/A N/A Debridement Treatment Procedure was  tolerated N/A N/A Response: well Post Debridement 2.2x2x0.2 N/A N/A Measurements L x W x D (cm) Post Debridement 0.691 N/A N/A Volume: (cm) Periwound Skin Texture: No Abnormalities Noted N/A N/A Periwound Skin No Abnormalities Noted N/A N/A Moisture: Periwound Skin Color: Hemosiderin Staining: Yes N/A N/A Mottled: Yes Temperature: No Abnormality N/A N/A Tenderness on Yes N/A N/A Palpation: Wound Preparation: Ulcer Cleansing: N/A N/A Rinsed/Irrigated with Saline Topical Anesthetic Applied: Other: lidocaine 4% Procedures Performed: Debridement N/A N/A Shives, Said L. ([id number]) Treatment Notes Wound #1 (Left, Lateral Lower Leg) 1. Cleansed with: Cleanse wound with antibacterial soap and water 3. Peri-wound Care: Barrier cream 4. Dressing Applied: Hydrafera Blue 5. Secondary Dressing Applied Bordered Foam Dressing Electronic Signature(s) Signed: 03/25/2017 9:24:55 AM By: Evlyn KannerBritto, Errol MD, FACS Entered By: Evlyn KannerBritto, Errol on 03/25/2017 09:24:55 Marone, James LangoICHARD L. ([id number]) -------------------------------------------------------------------------------- Multi-Disciplinary Care Plan Details Patient Name: [name], [name] Date of Service: 03/25/2017 8:15 AM Medical Record Number: [id number] Patient Account Number: [id number] Date of Birth/Sex: Jun 13, 1941 [age] [name].o. Male) Treating RN: Clover MealyAfful, RN, BSN, [place] Sinkita Primary Care Adarius Tigges: Windle GuardELKINS, WILSON Other Clinician: Referring Takeya Marquis: Windle GuardELKINS, WILSON Treating Mazal Ebey/Extender: Rudene ReBritto, Errol Weeks in Treatment: 1 Active Inactive ` Orientation to the Wound Care Program Nursing Diagnoses: Knowledge deficit related to the wound healing center program Goals: Patient/caregiver will verbalize understanding of the Wound Healing Center Program Date Initiated: 03/18/2017 Target Resolution Date: 07/19/2017 Goal Status: Active Interventions: Provide education on orientation to the wound center Notes: ` Wound/Skin Impairment Nursing  Diagnoses: Impaired tissue integrity Knowledge deficit related to ulceration/compromised skin integrity Goals: Patient/caregiver will verbalize understanding of skin care regimen Date Initiated: 03/18/2017 Target Resolution Date: 07/19/2017 Goal Status: Active Ulcer/skin breakdown will have a volume reduction of 30% by week 4 Date Initiated: 03/18/2017 Target Resolution Date: 07/19/2017 Goal Status: Active Ulcer/skin breakdown will have a volume reduction of 50% by week 8 Date Initiated: 03/18/2017 Target Resolution Date: 07/19/2017 Goal Status: Active Ulcer/skin breakdown will have a volume reduction of 80% by week 12 Date Initiated: 03/18/2017 Target Resolution Date: 07/19/2017 Goal Status: Active Ulcer/skin breakdown will heal within 14 weeks [name], [name] ([id number]) Date Initiated: 03/18/2017 Target Resolution Date: 07/19/2017 Goal Status: Active Interventions: Assess patient/caregiver ability to perform ulcer/skin care regimen upon admission and as needed Treatment Activities: Topical wound management initiated : 03/18/2017 Notes: Electronic Signature(s) Signed: 03/25/2017 2:56:30 PM By: Elpidio EricAfful, Rita BSN, RN Entered By: Elpidio EricAfful, Rita on 03/25/2017 08:39:47 Fulginiti, James LangoICHARD L. ([id number]) -------------------------------------------------------------------------------- Pain Assessment Details Patient Name: [name], [name] Date of Service: 03/25/2017 8:15 AM Medical Record Number: [id number] Patient Account Number: [id number] Date of Birth/Sex: Jun 13, 1941 [age] [name].o. Male) Treating RN: Clover MealyAfful, RN, BSN, [place] Sinkita Primary Care Jasline Buskirk: Windle GuardELKINS, WILSON Other Clinician: Referring Hawkin Charo: Windle GuardELKINS, WILSON Treating Kyser Wandel/Extender: Rudene ReBritto, Errol Weeks  in Treatment: 1 Active Problems Location of Pain Severity and Description of Pain Patient Has Paino No Site Locations With Dressing Change: No Pain Management and Medication Current Pain Management: Electronic Signature(s) Signed:  03/25/2017 2:56:30 PM By: Elpidio Eric BSN, RN Entered By: Elpidio Eric on 03/25/2017 08:23:21 [name], [name] ([id number]) -------------------------------------------------------------------------------- Patient/Caregiver Education Details Patient Name: [name], [name] Date of Service: 03/25/2017 8:15 AM Medical Record Number: [id number] Patient Account Number: [id number] Date of Birth/Gender: 05-19-1941 (76 [name].o. Male) Treating RN: Clover Mealy, RN, BSN, [place] Sink Primary Care Physician: Windle Guard Other Clinician: Referring Physician: Windle Guard Treating Physician/Extender: Rudene Re in Treatment: 1 Education Assessment Education Provided To: Patient Education Topics Provided Welcome To The Wound Care Center: Methods: Explain/Verbal Responses: Reinforcements needed Wound Debridement: Methods: Explain/Verbal Responses: Reinforcements needed Wound/Skin Impairment: Methods: Explain/Verbal Responses: Reinforcements needed Electronic Signature(s) Signed: 03/25/2017 2:56:30 PM By: Elpidio Eric BSN, RN Entered By: Elpidio Eric on 03/25/2017 08:45:10 Forgy, [name] ([id number]) -------------------------------------------------------------------------------- Wound Assessment Details Patient Name: [name], [name] Date of Service: 03/25/2017 8:15 AM Medical Record Number: [id number] Patient Account Number: [id number] Date of Birth/Sex: February 18, 1941 (76 [name].o. Male) Treating RN: Clover Mealy, RN, BSN, [place] Primary Care Chellsie Gomer: Windle Guard Other Clinician: Referring Arynn Armand: Windle Guard Treating Reyaansh Merlo/Extender: Rudene Re in Treatment: 1 Wound Status Wound Number: 1 Primary Trauma, Other Etiology: Wound Location: Left Lower Leg - Lateral Wound Open Wounding Event: Gradually Appeared Status: Date Acquired: 10/13/2016 Comorbid Cataracts, Arrhythmia, Hypertension, Weeks Of Treatment: 1 History: Peripheral Arterial Disease, Peripheral Clustered Wound: No Venous Disease,  Gout, Osteoarthritis Photos Photo Uploaded By: Elpidio Eric on 03/25/2017 14:52:25 Wound Measurements Length: (cm) 2.2 Width: (cm) 3 Depth: (cm) 0.2 Area: (cm) 5.184 Volume: (cm) 1.037 % Reduction in Area: -5.6% % Reduction in Volume: -5.6% Epithelialization: None Tunneling: No Undermining: No Wound Description Full Thickness Without Exposed Classification: Support Structures Wound Margin: Flat and Intact Medium [name], [name] ([id number]) Foul Odor After Cleansing: No Slough/Fibrino Yes Exudate Amount: Exudate Type: Serosanguineous Exudate Color: red, brown Wound Bed Granulation Amount: Small (1-33%) Exposed Structure Granulation Quality: Pink Fascia Exposed: No Necrotic Amount: Medium (34-66%) Fat Layer (Subcutaneous Tissue) Exposed: Yes Necrotic Quality: Adherent Slough Tendon Exposed: No Muscle Exposed: No Joint Exposed: No Bone Exposed: No Periwound Skin Texture Texture Color No Abnormalities Noted: No No Abnormalities Noted: No Hemosiderin Staining: Yes Moisture Mottled: Yes No Abnormalities Noted: No Temperature / Pain Temperature: No Abnormality Tenderness on Palpation: Yes Wound Preparation Ulcer Cleansing: Rinsed/Irrigated with Saline Topical Anesthetic Applied: Other: lidocaine 4%, Treatment Notes Wound #1 (Left, Lateral Lower Leg) 1. Cleansed with: Cleanse wound with antibacterial soap and water 3. Peri-wound Care: Barrier cream 4. Dressing Applied: Hydrafera Blue 5. Secondary Dressing Applied Bordered Foam Dressing Electronic Signature(s) Signed: 03/25/2017 2:56:30 PM By: Elpidio Eric BSN, RN Entered By: Elpidio Eric on 03/25/2017 08:28:50 [name], [name] ([id number]) -------------------------------------------------------------------------------- Vitals Details Patient Name: Gemmill, Sohum L. Date of Service: 03/25/2017 8:15 AM Medical Record Number: [id number] Patient Account Number: [id number] Date of Birth/Sex: [birth year] (76 [name].o.  Male) Treating RN: Clover Mealy, RN, BSN, [place] Primary Care Frenchie Pribyl: Windle Guard Other Clinician: Referring Rakim Moone: Windle Guard Treating Seve Monette/Extender: Rudene Re in Treatment: 1 Vital Signs Time Taken: 08:23 Temperature (F): 98.2 Height (in): 72 Pulse (bpm): 64 Weight (lbs): 226 Respiratory Rate (breaths/min): 16 Body Mass Index (BMI): 30.6 Blood Pressure (mmHg): 111/58 Reference Range: 80 - 120 mg / dl Electronic Signature(s) Signed: 03/25/2017 2:56:30 PM By: Elpidio Eric BSN, RN Entered By: Elpidio Eric on 03/25/2017 08:23:48

## 2017-03-26 NOTE — Progress Notes (Addendum)
ZACKARIE, CHASON ([id number]) Visit Report for 03/25/2017 Chief Complaint Document Details Patient Name: Lall, RUBLE L. Date of Service: 03/25/2017 8:15 AM Medical Record Number: [id number] Patient Account Number: [id number] Date of Birth/Sex: 03-Feb-1941 (76 y.o. Male) Treating RN: Clover Mealy, RN, BSN, [place] Sink Primary Care Provider: Windle Guard Other Clinician: Referring Provider: Windle Guard Treating Provider/Extender: Rudene Re in Treatment: 1 Information Obtained from: Patient Chief Complaint Left LE Ulcer Electronic Signature(s) Signed: 03/25/2017 9:25:11 AM By: Evlyn Kanner MD, FACS Entered By: Evlyn Kanner on 03/25/2017 09:25:11 Frankowski, [name] ([id number]) -------------------------------------------------------------------------------- Debridement Details Patient Name: Brossart, Kaito L. Date of Service: 03/25/2017 8:15 AM Medical Record Number: [id number] Patient Account Number: [id number] Date of Birth/Sex: 1940/12/04 (76 y.o. Male) Treating RN: Afful, RN, BSN, Rita Primary Care Provider: Windle Guard Other Clinician: Referring Provider: Windle Guard Treating Provider/Extender: Rudene Re in Treatment: 1 Debridement Performed for Wound #1 Left,Lateral Lower Leg Assessment: Performed By: Physician Evlyn Kanner, MD Debridement: Debridement Pre-procedure Verification/Time Out Yes - 08:41 Taken: Start Time: 08:41 Pain Control: Lidocaine 4% Topical Solution Level: Skin/Subcutaneous Tissue Total Area Debrided (L x 2.2 (cm) x 2 (cm) = 4.4 (cm) W): Tissue and other Non-Viable, Fat, Fibrin/Slough, Subcutaneous material debrided: Instrument: Curette Bleeding: Minimum Hemostasis Achieved: Pressure End Time: 08:42 Procedural Pain: 0 Post Procedural Pain: 0 Response to Treatment: Procedure was tolerated well Post Debridement Measurements of Total Wound Length: (cm) 2.2 Width: (cm) 2 Depth: (cm) 0.2 Volume: (cm) 0.691 Character of Wound/Ulcer  Post Stable Debridement: Post Procedure Diagnosis Same as Pre-procedure Electronic Signature(s) Signed: 03/25/2017 9:25:05 AM By: Evlyn Kanner MD, FACS Signed: 03/25/2017 2:56:30 PM By: Elpidio Eric BSN, RN Entered By: Evlyn Kanner on 03/25/2017 09:25:05 [place], [name] ([id number]) -------------------------------------------------------------------------------- HPI Details Patient Name: Riera, Vishaal L. Date of Service: 03/25/2017 8:15 AM Medical Record Number: [id number] Patient Account Number: [id number] Date of Birth/Sex: 04-Oct-1940 (76 y.o. Male) Treating RN: Clover Mealy, RN, BSN, [place] Sink Primary Care Provider: Windle Guard Other Clinician: Referring Provider: Windle Guard Treating Provider/Extender: Rudene Re in Treatment: 1 History of Present Illness HPI Description: 03/18/17 on evaluation today patient presents for initial visit concerning an ulcer he has on the left lateral lower extremity which has been present since mid February 2018. Unfortunately due to other family circumstances evaluation and treatment of this wound has been put on hold until this point when they could finally get him into be seen. He does have high blood pressure but has no history of diabetes though he does tell me that his cardiologist had been monitoring him for hyperglycemia and he is "borderline". He does have some discomfort in regard to this wound but fortunately this is not severe and typically with cleansing. His main concern is why this wound is not healing as in the past he has never had any difficulty with healing. The initial injury was during the period of time where he was helping a friend with some work and scrape the leg on a brick during that process. It has just never healed since that time. Patient is on chronic anticoagulant therapy and has not had any cardiovascular procedures such as arterial or venous imaging. 03/25/2017 - I'm seeing the patient for the first time today and  notes that he has had a injury to the left lower extremity since mid February and from what I understand a hematoma opened out into a lacerated wound and this has been slow to heal. He has had no history of previous arterial or venous problems and has had some  cardiac history with stents placed. He is not a diabetic and not a smoker. During his previous visit there has been a arterial and venous duplex study ordered and this is pending Electronic Signature(s) Signed: 03/25/2017 9:26:25 AM By: Evlyn Kanner MD, FACS Entered By: Evlyn Kanner on 03/25/2017 81:19:14 Reust, [name] ([id number]) -------------------------------------------------------------------------------- Physical Exam Details Patient Name: Ramos, Willian L. Date of Service: 03/25/2017 8:15 AM Medical Record Number: [id number] Patient Account Number: [id number] Date of Birth/Sex: December 09, 1940 (76 y.o. Male) Treating RN: Clover Mealy, RN, BSN, [place] Sink Primary Care Provider: Windle Guard Other Clinician: Referring Provider: Windle Guard Treating Provider/Extender: Rudene Re in Treatment: 1 Constitutional . Pulse regular. Respirations normal and unlabored. Afebrile. . Eyes Nonicteric. Reactive to light. Ears, Nose, Mouth, and Throat Lips, teeth, and gums WNL.[name] Moist mucosa without lesions. Neck supple and nontender. No palpable supraclavicular or cervical adenopathy. Normal sized without goiter. Respiratory WNL. No retractions.. Cardiovascular Pedal Pulses WNL. No clubbing, cyanosis or edema. Chest Breasts symmetical and no nipple discharge.. Breast tissue WNL, no masses, lumps, or tenderness.. Lymphatic No adneopathy. No adenopathy. No adenopathy. Musculoskeletal Adexa without tenderness or enlargement.. Digits and nails w/o clubbing, cyanosis, infection, petechiae, ischemia, or inflammatory conditions.. Integumentary (Hair, Skin) No suspicious lesions. No crepitus or fluctuance. No peri-wound warmth or erythema.  No masses.[name] Psychiatric Judgement and insight Intact.. No evidence of depression, anxiety, or agitation.. Notes the patient's left lower extremity laterally has a wound which has minimal subcutaneous debris and I have sharply remove this with a #3 curet and bleeding controlled with pressure. There is no surrounding cellulitis. Electronic Signature(s) Signed: 03/25/2017 9:27:08 AM By: Evlyn Kanner MD, FACS Entered By: Evlyn Kanner on 03/25/2017 09:27:07 Mutch, [name] ([id number]) -------------------------------------------------------------------------------- Physician Orders Details Patient Name: Gladhill, Brandn L. Date of Service: 03/25/2017 8:15 AM Medical Record Number: [id number] Patient Account Number: [id number] Date of Birth/Sex: Jan 14, 1941 (76 y.o. Male) Treating RN: Clover Mealy, RN, BSN, [place] Sink Primary Care Provider: Windle Guard Other Clinician: Referring Provider: Windle Guard Treating Provider/Extender: Rudene Re in Treatment: 1 Verbal / Phone Orders: No Diagnosis Coding Wound Cleansing Wound #1 Left,Lateral Lower Leg o Cleanse wound with mild soap and water o May shower with protection. Anesthetic Wound #1 Left,Lateral Lower Leg o Topical Lidocaine 4% cream applied to wound bed prior to debridement Skin Barriers/Peri-Wound Care Wound #1 Left,Lateral Lower Leg o Barrier cream o Moisturizing lotion Primary Wound Dressing Wound #1 Left,Lateral Lower Leg o Hydrafera Blue Secondary Dressing Wound #1 Left,Lateral Lower Leg o Boardered Foam Dressing Dressing Change Frequency Wound #1 Left,Lateral Lower Leg o Change dressing every other day. Follow-up Appointments Wound #1 Left,Lateral Lower Leg o Return Appointment in 1 week. Edema Control Wound #1 Left,Lateral Lower Leg o Elevate legs to the level of the heart and pump ankles as often as possible Hadden, Deaven L. ([id number]) Additional Orders / Instructions Wound #1 Left,Lateral Lower  Leg o Increase protein intake. o Activity as tolerated Electronic Signature(s) Signed: 03/25/2017 2:56:30 PM By: Elpidio Eric BSN, RN Signed: 03/25/2017 4:39:35 PM By: Evlyn Kanner MD, FACS Entered By: Elpidio Eric on 03/25/2017 14:42:17 [place], [name] ([id number]) -------------------------------------------------------------------------------- Problem List Details Patient Name: Jagielski, Washington L. Date of Service: 03/25/2017 8:15 AM Medical Record Number: [id number] Patient Account Number: [id number] Date of Birth/Sex: 1941/05/21 (76 y.o. 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 ([id number]) -------------------------------------------------------------------------------- Progress Note Details Patient Name: Jenniges, Shalev L. Date of Service: 03/25/2017 8:15 AM Medical Record Number: [id number] Patient Account Number: [id number] Date of Birth/Sex: 08/09/1941 [age] y.o. Male) Treating RN: Clover MealyAfful, RN, BSN, [place] Sinkita Primary Care Provider: Windle GuardELKINS, WILSON Other Clinician: Referring Provider: Windle GuardELKINS, WILSON Treating Provider/Extender: Rudene ReBritto, Agape Hardiman Weeks in Treatment: 1 Subjective Chief Complaint Information obtained from Patient Left LE Ulcer History of Present Illness (HPI) 03/18/17 on evaluation today patient presents for initial visit concerning an ulcer he has on the  left lateral lower extremity which has been present since mid February 2018. Unfortunately due to other family circumstances evaluation and treatment of this wound has been put on hold until this point when they could finally get him into be seen. He does have high blood pressure but has no history of diabetes though he does tell me that his cardiologist had been monitoring him for hyperglycemia and he is "borderline". He does have some discomfort in regard to this wound but fortunately this is not severe and typically with cleansing. His main concern is why this wound is not healing as in the past he has never had any difficulty with healing. The initial injury was during the period of time where he was helping a friend with some work and scrape the leg on a brick during that process. It has just never healed since that time. Patient is on chronic anticoagulant therapy and has not had any cardiovascular procedures such as arterial or venous imaging. 03/25/2017 - I'm seeing the patient for the first time today and notes that he has had a injury to the left lower extremity since mid February and from what I understand a hematoma opened out into a lacerated wound and this has been slow to heal. He has had no history of previous arterial or venous problems and has had some cardiac history with stents placed. He is not a diabetic and not a smoker. During his previous visit there has been a arterial and venous duplex study ordered and this is pending Objective Constitutional Pulse regular. Respirations normal and unlabored. Afebrile. Vitals Time Taken: 8:23 AM, Height: 72 in, Weight: 226 lbs, BMI: 30.6, Temperature: 98.2 F, Pulse: 64 bpm, Respiratory Rate: 16 breaths/min, Blood Pressure: 111/58 mmHg. Drakes, Sye L. ([id number]) Eyes Nonicteric. Reactive to light. Ears, Nose, Mouth, and Throat Lips, teeth, and gums WNL.[name]. Moist mucosa without lesions. Neck supple and nontender. No palpable  supraclavicular or cervical adenopathy. Normal sized without goiter. Respiratory WNL. No retractions.. Cardiovascular Pedal Pulses WNL. No clubbing, cyanosis or edema. Chest Breasts symmetical and no nipple discharge.. Breast tissue WNL, no masses, lumps, or tenderness.. Lymphatic No adneopathy. No adenopathy. No adenopathy. Musculoskeletal Adexa without tenderness or enlargement.. Digits and nails w/o clubbing, cyanosis, infection, petechiae, ischemia, or inflammatory conditions.[name]. Psychiatric Judgement and insight Intact.. No evidence of depression, anxiety, or agitation.. General Notes: the patient's left lower extremity laterally has a wound which has minimal subcutaneous debris and I have sharply remove this with a #3 curet and bleeding controlled with pressure. There is no surrounding cellulitis. Integumentary (Hair, Skin) No suspicious lesions. No crepitus or fluctuance. No peri-wound warmth or erythema. No masses.. Wound #1 status is Open. Original cause of  wound was Gradually Appeared. The wound is located on the Left,Lateral Lower Leg. The wound measures 2.2cm length x 3cm width x 0.2cm depth; 5.184cm^2 area and 1.037cm^3 volume. There is Fat Layer (Subcutaneous Tissue) Exposed exposed. There is no tunneling or undermining noted. There is a medium amount of serosanguineous drainage noted. The wound margin is flat and intact. There is small (1-33%) pink granulation within the wound bed. There is a medium (34-66%) amount of necrotic tissue within the wound bed including Adherent Slough. The periwound skin appearance exhibited: Hemosiderin Staining, Mottled. Periwound temperature was noted as No Abnormality. The periwound has tenderness on palpation. 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 Elevation and exercise 2. Hydrofera Blue to be applied every other day with a bordered foam 3. Arterial and venous duplex studies which have been ordered to be completed 4. Regular visits to the wound center. the patient and his daughter have added all questions answered and will be compliant. Procedures Wound #1 Pre-procedure diagnosis of Wound #1 is a Trauma, Other located on the Left,Lateral Lower Leg . There was a Skin/Subcutaneous Tissue Debridement ([id number]) debridement with total area of 4.4 sq cm performed by Evlyn Kanner, MD. with the following instrument(s): Curette to remove Non-Viable tissue/material including Fat Layer (and Subcutaneous Tissue) Exposed, Fibrin/Slough, and Subcutaneous after achieving pain control using Lidocaine 4% Topical Solution. A time out was conducted at 08:41, prior to the start of the procedure. A Minimum amount of bleeding was controlled with Pressure. The procedure was tolerated well with a pain level of 0 throughout and a pain level of 0 following the procedure. Post Debridement Measurements: 2.2cm length x 2cm width x 0.2cm depth; 0.691cm^3 volume. Character of Wound/Ulcer Post Debridement is stable. Post procedure Diagnosis Wound #1: Same as Pre-Procedure Plan Wound Cleansing: Wound #1 Left,Lateral Lower Leg: Cleanse wound with mild soap and water May shower with protection. Anesthetic: Wound #1 Left,Lateral Lower Leg: Topical Lidocaine 4% cream applied to wound bed prior to debridement Canby, Ryne L. ([id number]) Skin Barriers/Peri-Wound Care: Wound #1 Left,Lateral Lower Leg: Barrier cream Moisturizing lotion Primary Wound Dressing: Wound #1 Left,Lateral Lower Leg: Hydrafera Blue Secondary Dressing: Wound #1 Left,Lateral Lower Leg: Boardered Foam Dressing Dressing Change Frequency: Wound #1 Left,Lateral Lower Leg: Change dressing  every other day. Follow-up Appointments: Wound #1 Left,Lateral Lower Leg: Return Appointment in 1 week. Edema Control: Wound #1 Left,Lateral Lower Leg: Elevate legs to the level of the heart and pump ankles as often as possible Additional Orders / Instructions: Wound #1 Left,Lateral Lower Leg: Increase protein intake. Activity as tolerated after a thorough review of his history and physical I have recommended: 1. Elevation and exercise 2. Hydrofera Blue to be applied every other day with a bordered foam 3. Arterial and venous duplex studies which have been ordered to be completed 4. Regular visits to the wound center. the patient and his daughter have added all questions answered and will be compliant. Electronic Signature(s) Signed: 03/25/2017 4:40:40 PM By: Evlyn Kanner MD, FACS Previous Signature: 03/25/2017 4:40:30 PM Version By: Evlyn Kanner MD, FACS Previous Signature: 03/25/2017 9:28:25 AM Version By: Evlyn Kanner MD, FACS Entered By: Evlyn Kanner on 03/25/2017 16:40:40 Salsbury, [name] ([id number]) -------------------------------------------------------------------------------- SuperBill Details Patient Name: Scharf, Jovahn L. Date of Service: 03/25/2017 Medical  Record Number: [id number] Patient Account Number: [id number] Date of Birth/Sex: 10-Sep-1940 (76 y.o. 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 Venous insufficiency (chronic) (peripheral) L97.822 Non-pressure chronic ulcer of other part of left lower leg with fat layer exposed I10 Essential (primary) hypertension Facility Procedures CPT4: Description Modifier Quantity Code 24401027 11042 - DEB SUBQ TISSUE 20 SQ CM/< 1 ICD-10 Description Diagnosis I87.2 Venous insufficiency (chronic) (peripheral) L97.822  Non-pressure chronic ulcer of other part of left lower leg with fat layer  exposed I10 Essential (primary) hypertension Physician Procedures CPT4: Description Modifier Quantity Code [id number] 11042 - WC PHYS SUBQ TISS 20 SQ CM 1 ICD-10 Description Diagnosis I87.2 Venous insufficiency (chronic) (peripheral) L97.822 Non-pressure chronic ulcer of other part of left lower leg with fat layer exposed  I10 Essential (primary) hypertension Electronic Signature(s) Signed: 03/25/2017 9:28:36 AM By: Evlyn Kanner MD, FACS Entered By: Evlyn Kanner on 03/25/2017 09:28:35

## 2017-03-31 ENCOUNTER — Other Ambulatory Visit: Payer: Self-pay | Admitting: Surgery

## 2017-03-31 DIAGNOSIS — L97922 Non-pressure chronic ulcer of unspecified part of left lower leg with fat layer exposed: Secondary | ICD-10-CM

## 2017-04-01 ENCOUNTER — Encounter: Payer: Medicare Other | Admitting: Surgery

## 2017-04-01 DIAGNOSIS — L97822 Non-pressure chronic ulcer of other part of left lower leg with fat layer exposed: Secondary | ICD-10-CM | POA: Diagnosis not present

## 2017-04-02 ENCOUNTER — Other Ambulatory Visit: Payer: Self-pay | Admitting: Surgery

## 2017-04-02 ENCOUNTER — Ambulatory Visit (HOSPITAL_COMMUNITY)
Admission: RE | Admit: 2017-04-02 | Discharge: 2017-04-02 | Disposition: A | Discharge status: Home / Self Care | Payer: Medicare Other | Source: Ambulatory Visit | Attending: Vascular Surgery | Admitting: Vascular Surgery

## 2017-04-02 DIAGNOSIS — L97929 Non-pressure chronic ulcer of unspecified part of left lower leg with unspecified severity: Secondary | ICD-10-CM

## 2017-04-03 NOTE — Progress Notes (Signed)
Monico BlitzHICE, Jourdain L. ([id number]) Visit Report for 04/01/2017 Chief Complaint Document Details Patient Name: Garriga, [name] L. Date of Service: 04/01/2017 8:15 AM Medical Record Number: [id number] Patient Account Number: [id number] Date of Birth/Sex: 1941/04/04 [age] y.o. Male) Treating RN: Clover MealyAfful, RN, BSN, [place] Primary Care Provider: Windle GuardELKINS, WILSON Other Clinician: Referring Provider: Windle GuardELKINS, WILSON Treating Provider/Extender: Rudene ReBritto, Tasmin Exantus Weeks in Treatment: 2 Information Obtained from: Patient Chief Complaint Left LE Ulcer Electronic Signature(s) Signed: 04/01/2017 9:09:44 AM By: Evlyn KannerBritto, Addisynn Vassell MD, FACS Entered By: Evlyn KannerBritto, Amantha Sklar on 04/01/2017 09:09:44 Vink, Ferdinand LangoICHARD L. ([id number]) -------------------------------------------------------------------------------- HPI Details Patient Name: [name], [name] Date of Service: 04/01/2017 8:15 AM Medical Record Number: [id number] Patient Account Number: [id number] Date of Birth/Sex: 1941/04/04 [age] y.o. Male) Treating RN: Clover MealyAfful, RN, BSN, [place]inkita Primary Care Provider: Windle GuardELKINS, WILSON Other Clinician: Referring Provider: Windle GuardELKINS, WILSON Treating Provider/Extender: Rudene ReBritto, Hermine Feria Weeks in Treatment: 2 History of Present Illness HPI Description: 03/18/17 on evaluation today patient presents for initial visit concerning an ulcer he has on the left lateral lower extremity which has been present since mid February 2018. Unfortunately due to other family circumstances evaluation and treatment of this wound has been put on hold until this point when they could finally get him into be seen. He does have high blood pressure but has no history of diabetes though he does tell me that his cardiologist had been monitoring him for hyperglycemia and he is "borderline". He does have some discomfort in regard to this wound but fortunately this is not severe and typically with cleansing. His main concern is why this wound is not healing as in the past he has never had any  difficulty with healing. The initial injury was during the period of time where he was helping a friend with some work and scrape the leg on a brick during that process. It has just never healed since that time. Patient is on chronic anticoagulant therapy and has not had any cardiovascular procedures such as arterial or venous imaging. 03/25/2017 - I'm seeing the patient for the first time today and notes that he has had a injury to the left lower extremity since mid February and from what I understand a hematoma opened out into a lacerated wound and this has been slow to heal. He has had no history of previous arterial or venous problems and has had some cardiac history with stents placed. He is not a diabetic and not a smoker. During his previous visit there has been a arterial and venous duplex study ordered and this is pending 04/01/2017 -- arterial venous duplex studies still pending Electronic Signature(s) Signed: 04/01/2017 9:10:02 AM By: Evlyn KannerBritto, Halleigh Comes MD, FACS Entered By: Evlyn KannerBritto, Balian Schaller on 04/01/2017 09:10:01 Bacus, Ferdinand LangoICHARD L. ([id number]) -------------------------------------------------------------------------------- Physical Exam Details Patient Name: [name], [name] Date of Service: 04/01/2017 8:15 AM Medical Record Number: [id number] Patient Account Number: [id number] Date of Birth/Sex: 1941/04/04 [age] y.o. Male) Treating RN: Clover MealyAfful, RN, BSN, [place] Primary Care Provider: Windle GuardELKINS, WILSON Other Clinician: Referring Provider: Windle GuardELKINS, WILSON Treating Provider/Extender: Rudene ReBritto, Rebbeca Sheperd Weeks in Treatment: 2 Constitutional . Pulse regular. Respirations normal and unlabored. Afebrile. . Eyes Nonicteric. Reactive to light. Ears, Nose, Mouth, and Throat Lips, teeth, and gums WNL.[name]. Moist mucosa without lesions. Neck supple and nontender. No palpable supraclavicular or cervical adenopathy. Normal sized without goiter. Respiratory WNL. No retractions.. Breath sounds WNL, No rubs, rales,  rhonchi, or wheeze.. Cardiovascular Heart rhythm and rate regular, no murmur or gallop.. Pedal Pulses WNL. No clubbing, cyanosis or edema. Chest Breasts symmetical and no nipple discharge..[name]  Breast tissue WNL, no masses, lumps, or tenderness.. Lymphatic No adneopathy. No adenopathy. No adenopathy. Musculoskeletal Adexa without tenderness or enlargement.. Digits and nails w/o clubbing, cyanosis, infection, petechiae, ischemia, or inflammatory conditions.. Integumentary (Hair, Skin) No suspicious lesions. No crepitus or fluctuance. No peri-wound warmth or erythema. No masses.[name] Psychiatric Judgement and insight Intact.. No evidence of depression, anxiety, or agitation.. Notes the wound on the left lower extremity is looking good with no definite sharp debridement and there is some tape burns surrounding it Electronic Signature(s) Signed: 04/01/2017 9:10:21 AM By: Evlyn Kanner MD, FACS Entered By: Evlyn Kanner on 04/01/2017 09:10:21 Recker, [name] ([id number]) -------------------------------------------------------------------------------- Physician Orders Details Patient Name: Yett, Oryn L. Date of Service: 04/01/2017 8:15 AM Medical Record Number: [id number] Patient Account Number: [id number] Date of Birth/Sex: 06-06-1941 (76 y.o. Male) Treating RN: Clover Mealy, RN, BSN, [place] Sink Primary Care Provider: Windle Guard Other Clinician: Referring Provider: Windle Guard Treating Provider/Extender: Rudene Re in Treatment: 2 Verbal / Phone Orders: No Diagnosis Coding Wound Cleansing Wound #1 Left,Lateral Lower Leg o Cleanse wound with mild soap and water o May shower with protection. Anesthetic Wound #1 Left,Lateral Lower Leg o Topical Lidocaine 4% cream applied to wound bed prior to debridement Skin Barriers/Peri-Wound Care Wound #1 Left,Lateral Lower Leg o Barrier cream o Moisturizing lotion Primary Wound Dressing Wound #1 Left,Lateral Lower Leg o Hydrafera  Blue Secondary Dressing Wound #1 Left,Lateral Lower Leg o Gauze and Kerlix/Conform Dressing Change Frequency Wound #1 Left,Lateral Lower Leg o Change dressing every other day. Follow-up Appointments Wound #1 Left,Lateral Lower Leg o Return Appointment in 1 week. Edema Control Wound #1 Left,Lateral Lower Leg o Elevate legs to the level of the heart and pump ankles as often as possible Mckercher, Avion L. ([id number]) Additional Orders / Instructions Wound #1 Left,Lateral Lower Leg o Increase protein intake. o Activity as tolerated Electronic Signature(s) Signed: 04/01/2017 3:53:48 PM By: Evlyn Kanner MD, FACS Signed: 04/01/2017 4:40:57 PM By: Elpidio Eric BSN, RN Entered By: Elpidio Eric on 04/01/2017 08:52:01 Garlow, [name] ([id number]) -------------------------------------------------------------------------------- Problem List Details Patient Name: [name], [name] Date of Service: 04/01/2017 8:15 AM Medical Record Number: [id number] Patient Account Number: [id number] Date of Birth/Sex: Jan 20, 1941 (76 y.o. 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 Date of Service: 04/01/2017 8:15 AM Medical Record  Number: [id number] Patient Account Number: [id number] Date of Birth/Sex: Jan 13, 1941 (76 y.o. Male) Treating RN: Clover Mealy, RN, BSN, [place] Sink Primary Care Provider: Windle Guard Other Clinician: Referring Provider: Windle Guard Treating Provider/Extender: Rudene Re in Treatment: 2 Subjective Chief Complaint Information obtained from Patient Left LE Ulcer History of Present Illness (HPI) 03/18/17 on evaluation today patient presents for initial visit concerning an ulcer he has on the left lateral lower extremity which has been present since mid February 2018. Unfortunately due to other family circumstances evaluation and treatment of this wound has been put on hold until this point when they could finally get him into be seen. He does have high blood  pressure but has no history of diabetes though he does tell me that his cardiologist had been monitoring him for hyperglycemia and he is "borderline". He does have some discomfort in regard to this wound but fortunately this is not severe and typically with cleansing. His main concern is why this wound is not healing as in the past he has never had any difficulty with healing. The initial injury was during the period of time where he was helping a friend with some work and scrape the leg on a brick during that process. It has just never healed since that time. Patient is on chronic anticoagulant therapy and has not had any cardiovascular procedures such as arterial or venous imaging. 03/25/2017 - I'm seeing the patient for the first time today and notes that he has had a injury to the left lower extremity since mid February and from what I understand a hematoma opened out into a lacerated wound and this has been slow to heal. He has had no history of previous arterial or venous problems and has had some cardiac history with stents placed. He is not a diabetic and not a smoker. During his previous visit there has been a arterial and venous  duplex study ordered and this is pending 04/01/2017 -- arterial venous duplex studies still pending Objective Constitutional Pulse regular. Respirations normal and unlabored. Afebrile. Vitals Time Taken: 8:24 AM, Height: 72 in, Weight: 226 lbs, BMI: 30.6, Temperature: 97. F, Pulse: 62 bpm, Gavia, Griffen L. ([id number]) Respiratory Rate: 16 breaths/min, Blood Pressure: 99/48 mmHg. Eyes Nonicteric. Reactive to light. Ears, Nose, Mouth, and Throat Lips, teeth, and gums WNL.[name] Moist mucosa without lesions. Neck supple and nontender. No palpable supraclavicular or cervical adenopathy. Normal sized without goiter. Respiratory WNL. No retractions.. Breath sounds WNL, No rubs, rales, rhonchi, or wheeze.. Cardiovascular Heart rhythm and rate regular, no murmur or gallop.. Pedal Pulses WNL. No clubbing, cyanosis or edema. Chest Breasts symmetical and no nipple discharge.. Breast tissue WNL, no masses, lumps, or tenderness.. Lymphatic No adneopathy. No adenopathy. No adenopathy. Musculoskeletal Adexa without tenderness or enlargement.. Digits and nails w/o clubbing, cyanosis, infection, petechiae, ischemia, or inflammatory conditions.[name] Psychiatric Judgement and insight Intact.. No evidence of depression, anxiety, or agitation.. General Notes: the wound on the left lower extremity is looking good with no definite sharp debridement and there is some tape burns surrounding it Integumentary (Hair, Skin) No suspicious lesions. No crepitus or fluctuance. No peri-wound warmth or erythema. No masses.. Wound #1 status is Open. Original cause of wound was Gradually Appeared. The wound is located on the Left,Lateral Lower Leg. The wound measures 2.1cm length x 1.9cm width x 0.1cm depth; 3.134cm^2 area and 0.313cm^3 volume. 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 Anesthetic: Wound #1 Left,Lateral Lower Leg: Topical Lidocaine 4% cream applied to wound bed prior to debridement Skin Barriers/Peri-Wound Care: Wound #1 Left,Lateral Lower Leg: Barrier cream Moisturizing lotion Primary Wound Dressing: Wound #1 Left,Lateral Lower Leg: Hydrafera Blue Secondary Dressing: Wound #1 Left,Lateral Lower Leg: Gauze and Kerlix/Conform Dressing Change Frequency: Wound #1 Left,Lateral Lower Leg: Change dressing every other day. Follow-up Appointments: Wound #1 Left,Lateral Lower Leg: Return Appointment in 1 week. Edema Control: Wound #1 Left,Lateral Lower Leg: Elevate legs to the  level of the heart and pump ankles as often as possible Additional Orders / Instructions: Wound #1 Left,Lateral Lower Leg: Increase protein intake. Activity as tolerated after a thorough review I have recommended: Bennett, Arion L. ([id number]) 1. Elevation and exercise 2. Hydrofera Blue to be applied every other day with a bordered foam 3. Arterial and venous duplex studies --pending 4. Regular visits to the wound center. the patient and his daughter have added all questions answered and will be compliant. Electronic Signature(s) Signed: 04/01/2017 9:11:07 AM By: Evlyn KannerBritto, Helaine Yackel MD, FACS Entered By: Evlyn KannerBritto, Marquice Uddin on 04/01/2017 09:11:07 Bloch, Ferdinand LangoICHARD L. ([id number]) -------------------------------------------------------------------------------- SuperBill Details Patient Name: [name], Rani L. Date of Service: 04/01/2017 Medical Record Number: [id number] Patient Account Number: [id number] Date of Birth/Sex: 1940-11-25 [age] y.o. 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 (chronic) (peripheral) L97.822 Non-pressure chronic ulcer of other part of left lower leg with fat layer exposed I10 Essential (primary) hypertension Facility Procedures CPT4 Code: [id number] Description: [id number] - WOUND CARE VISIT-LEV 2 EST PT Modifier: Quantity: 1 Physician Procedures CPT4: Description Modifier Quantity Code [id number] - WC PHYS LEVEL 3 - EST PT 1 ICD-10 Description Diagnosis I87.2 Venous insufficiency (chronic) (peripheral) I10 Essential (primary) hypertension L97.822 Non-pressure chronic ulcer of other part of  left lower leg with fat layer exposed Electronic Signature(s) Signed: 04/01/2017 9:11:22 AM By: Evlyn KannerBritto, Tonimarie Gritz MD, FACS Entered By: Evlyn KannerBritto, Bradden Tadros on 04/01/2017 09:11:21

## 2017-04-03 NOTE — Progress Notes (Signed)
[name], Jermale L. ([id number]) Visit Report for 04/01/2017 Arrival Information Details Patient Name: [name], Leo L. Date of Service: 04/01/2017 8:15 AM Medical Record Number: [id number] Patient Account Number: [id number] Date of Birth/Sex: September 27, 1940 [age] y.o. Male) Treating RN: Clover MealyAfful, RN, BSN, [place] Primary Care Chesnee Floren: Windle GuardELKINS, WILSON Other Clinician: Referring Tara Rud: Windle GuardELKINS, WILSON Treating [place] Vanaken/Extender: Rudene ReBritto, Errol Weeks in Treatment: 2 Visit Information History Since Last Visit All ordered tests and consults were completed: No Patient Arrived: Ambulatory Added or deleted any medications: No Arrival Time: 08:17 Any new allergies or adverse reactions: No Accompanied By: dtr Had a fall or experienced change in No Transfer Assistance: None activities of daily living that may affect Patient Identification Verified: Yes risk of falls: Secondary Verification Process Yes Signs or symptoms of abuse/neglect since last No Completed: visito Patient Has Alerts: Yes Hospitalized since last visit: No Patient Alerts: Patient on Blood Has Dressing in Place as Prescribed: Yes Thinner Pain Present Now: No ELIQUIS Electronic Signature(s) Signed: 04/01/2017 4:40:57 PM By: Elpidio EricAfful, Rita BSN, RN Entered By: Elpidio EricAfful, Rita on 04/01/2017 08:18:56 Ballester, Ferdinand LangoICHARD L. ([id number]) -------------------------------------------------------------------------------- Clinic Level of Care Assessment Details Patient Name: Landa, Daqwan L. Date of Service: 04/01/2017 8:15 AM Medical Record Number: [id number] Patient Account Number: [id number] Date of Birth/Sex: September 27, 1940 [age] y.o. Male) Treating RN: Clover MealyAfful, RN, BSN, Rita Primary Care Anetria Harwick: Windle GuardELKINS, WILSON Other Clinician: Referring Jerret Mcbane: Windle GuardELKINS, WILSON Treating Miles Leyda/Extender: Rudene ReBritto, Errol Weeks in Treatment: 2 Clinic Level of Care Assessment Items TOOL 4 Quantity Score []  - Use when only an EandM is performed on FOLLOW-UP visit  0 ASSESSMENTS - Nursing Assessment / Reassessment X - Reassessment of Co-morbidities (includes updates in patient status) 1 10 X - Reassessment of Adherence to Treatment Plan 1 5 ASSESSMENTS - Wound and Skin Assessment / Reassessment X - Simple Wound Assessment / Reassessment - one wound 1 5 []  - Complex Wound Assessment / Reassessment - multiple wounds 0 []  - Dermatologic / Skin Assessment (not related to wound area) 0 ASSESSMENTS - Focused Assessment []  - Circumferential Edema Measurements - multi extremities 0 []  - Nutritional Assessment / Counseling / Intervention 0 X - Lower Extremity Assessment (monofilament, tuning fork, pulses) 1 5 []  - Peripheral Arterial Disease Assessment (using hand held doppler) 0 ASSESSMENTS - Ostomy and/or Continence Assessment and Care []  - Incontinence Assessment and Management 0 []  - Ostomy Care Assessment and Management (repouching, etc.) 0 PROCESS - Coordination of Care X - Simple Patient / Family Education for ongoing care 1 15 []  - Complex (extensive) Patient / Family Education for ongoing care 0 []  - Staff obtains ChiropractorConsents, Records, Test Results / Process Orders 0 []  - Staff telephones HHA, Nursing Homes / Clarify orders / etc 0 []  - Routine Transfer to another Facility (non-emergent condition) 0 Raczynski, Yvonne L. ([id number]) []  - Routine Hospital Admission (non-emergent condition) 0 []  - New Admissions / Manufacturing engineernsurance Authorizations / Ordering NPWT, Apligraf, etc. 0 []  - Emergency Hospital Admission (emergent condition) 0 []  - Simple Discharge Coordination 0 []  - Complex (extensive) Discharge Coordination 0 PROCESS - Special Needs []  - Pediatric / Minor Patient Management 0 []  - Isolation Patient Management 0 []  - Hearing / Language / Visual special needs 0 []  - Assessment of Community assistance (transportation, D/C planning, etc.) 0 []  - Additional assistance / Altered mentation 0 []  - Support Surface(s) Assessment (bed, cushion, seat, etc.)  0 INTERVENTIONS - Wound Cleansing / Measurement X - Simple Wound Cleansing - one wound 1 5 []  - Complex Wound Cleansing - multiple wounds  0 X - Wound Imaging (photographs - any number of wounds) 1 5 []  - Wound Tracing (instead of photographs) 0 X - Simple Wound Measurement - one wound 1 5 []  - Complex Wound Measurement - multiple wounds 0 INTERVENTIONS - Wound Dressings X - Small Wound Dressing one or multiple wounds 1 10 []  - Medium Wound Dressing one or multiple wounds 0 []  - Large Wound Dressing one or multiple wounds 0 []  - Application of Medications - topical 0 []  - Application of Medications - injection 0 INTERVENTIONS - Miscellaneous []  - External ear exam 0 Broady, Achille L. ([id number]) []  - Specimen Collection (cultures, biopsies, blood, body fluids, etc.) 0 []  - Specimen(s) / Culture(s) sent or taken to Lab for analysis 0 []  - Patient Transfer (multiple staff / Michiel Sites Lift / Similar devices) 0 []  - Simple Staple / Suture removal (25 or less) 0 []  - Complex Staple / Suture removal (26 or more) 0 []  - Hypo / Hyperglycemic Management (close monitor of Blood Glucose) 0 []  - Ankle / Brachial Index (ABI) - do not check if billed separately 0 X - Vital Signs 1 5 Has the patient been seen at the hospital within the last three years: Yes Total Score: 70 Level Of Care: New/Established - Level 2 Electronic Signature(s) Signed: 04/01/2017 4:40:57 PM By: Elpidio Eric BSN, RN Entered By: Elpidio Eric on 04/01/2017 08:52:30 [place], [name] ([id number]) -------------------------------------------------------------------------------- Encounter Discharge Information Details Patient Name: Pultz, Karan L. Date of Service: 04/01/2017 8:15 AM Medical Record Number: [id number] Patient Account Number: [id number] Date of Birth/Sex: 1941/06/08 (76 y.o. Male) Treating RN: Clover Mealy, RN, BSN, [place] Sink Primary Care Emilygrace Grothe: Windle Guard Other Clinician: Referring Turon Kilmer: Windle Guard Treating  Daren Yeagle/Extender: Rudene Re in Treatment: 2 Encounter Discharge Information Items Discharge Pain Level: 0 Discharge Condition: Stable Ambulatory Status: Ambulatory Discharge Destination: Home Transportation: Private Auto Accompanied By: dtr Schedule Follow-up Appointment: No Medication Reconciliation completed No and provided to Patient/Care Amaurie Wandel: Patient Clinical Summary of Care: Declined Electronic Signature(s) Signed: 04/01/2017 9:14:09 AM By: Gwenlyn Perking Entered By: Gwenlyn Perking on 04/01/2017 09:14:09 Brinton, [name] ([id number]) -------------------------------------------------------------------------------- Lower Extremity Assessment Details Patient Name: Hara, Yasseen L. Date of Service: 04/01/2017 8:15 AM Medical Record Number: [id number] Patient Account Number: [id number] Date of Birth/Sex: Feb 17, 1941 (76 y.o. Male) Treating RN: Clover Mealy, RN, BSN, [place] Sink Primary Care Nyeemah Jennette: Windle Guard Other Clinician: Referring Ido Wollman: Windle Guard Treating Mckell Riecke/Extender: Rudene Re in Treatment: 2 Vascular Assessment Claudication: Claudication Assessment [Left:None] Pulses: Dorsalis Pedis Palpable: [Left:Yes] Posterior Tibial Extremity colors, hair growth, and conditions: Extremity Color: [Left:Normal] Temperature of Extremity: [Left:Warm] Capillary Refill: [Left:< 3 seconds] Electronic Signature(s) Signed: 04/01/2017 4:40:57 PM By: Elpidio Eric BSN, RN Entered By: Elpidio Eric on 04/01/2017 08:26:01 Allie, [name] ([id number]) -------------------------------------------------------------------------------- Multi Wound Chart Details Patient Name: Harshberger, Maveryk L. Date of Service: 04/01/2017 8:15 AM Medical Record Number: [id number] Patient Account Number: [id number] Date of Birth/Sex: 1941-05-14 (76 y.o. Male) Treating RN: Clover Mealy, RN, BSN, [place] Sink Primary Care Markell Schrier: Windle Guard Other Clinician: Referring Raksha Wolfgang: Windle Guard Treating Sherel Fennell/Extender: Rudene Re in Treatment: 2 Vital Signs Height(in): 72 Pulse(bpm): 62 Weight(lbs): 226 Blood Pressure 99/48 (mmHg): Body Mass Index(BMI): 31 Temperature(F): 97. Respiratory Rate 16 (breaths/min): Photos: [1:No Photos] [N/A:N/A] Wound Location: [1:Left, Lateral Lower Leg] [N/A:N/A] Wounding Event: [1:Gradually Appeared] [N/A:N/A] Primary Etiology: [1:Trauma, Other] [N/A:N/A] Date Acquired: [1:10/13/2016] [N/A:N/A] Weeks of Treatment: [1:2] [N/A:N/A] Wound Status: [1:Open] [N/A:N/A] Measurements L x W x D 2.1x1.9x0.1 [N/A:N/A] (cm) Area (cm) : [1:3.134] [N/A:N/A] Volume (cm) : [  1:0.313] [N/A:N/A] % Reduction in Area: [1:36.20%] [N/A:N/A] % Reduction in Volume: 68.10% [N/A:N/A] Classification: [1:Full Thickness Without Exposed Support Structures] [N/A:N/A] Periwound Skin Texture: No Abnormalities Noted [N/A:N/A] Periwound Skin [1:No Abnormalities Noted] [N/A:N/A] Moisture: Periwound Skin Color: No Abnormalities Noted [N/A:N/A] Tenderness on [1:No] [N/A:N/A] Treatment Notes Wound #1 (Left, Lateral Lower Leg) 1. Cleansed with: Cleanse wound with antibacterial soap and water 3. Peri-wound Care: Barrier cream Sigel, Steen L. ([id number]) 4. Dressing Applied: Hydrafera Blue 5. Secondary Dressing Applied Gauze and Kerlix/Conform 7. Secured with Secretary/administrator) Signed: 04/01/2017 9:09:37 AM By: Evlyn Kanner MD, FACS Entered By: Evlyn Kanner on 04/01/2017 09:09:37 Barco, [name] ([id number]) -------------------------------------------------------------------------------- Multi-Disciplinary Care Plan Details Patient Name: Jarosz, Jeziel L. Date of Service: 04/01/2017 8:15 AM Medical Record Number: [id number] Patient Account Number: [id number] Date of Birth/Sex: [birth year] (76 y.o. Male) Treating RN: Clover Mealy, RN, BSN, [place] Sink Primary Care Lichelle Viets: Windle Guard Other Clinician: Referring Winslow Ederer: Windle Guard Treating Avyonna Wagoner/Extender: Rudene Re in Treatment: 2 Active Inactive ` Orientation to the Wound Care Program Nursing Diagnoses: Knowledge deficit related to the wound healing center program Goals: Patient/caregiver will verbalize understanding of the Wound Healing Center Program Date Initiated: 03/18/2017 Target Resolution Date: 07/19/2017 Goal Status: Active Interventions: Provide education on orientation to the wound center Notes: ` Wound/Skin Impairment Nursing Diagnoses: Impaired tissue integrity Knowledge deficit related to ulceration/compromised skin integrity Goals: Patient/caregiver will verbalize understanding of skin care regimen Date Initiated: 03/18/2017 Target Resolution Date: 07/19/2017 Goal Status: Active Ulcer/skin breakdown will have a volume reduction of 30% by week 4 Date Initiated: 03/18/2017 Target Resolution Date: 07/19/2017 Goal Status: Active Ulcer/skin breakdown will have a volume reduction of 50% by week 8 Date Initiated: 03/18/2017 Target Resolution Date: 07/19/2017 Goal Status: Active Ulcer/skin breakdown will have a volume reduction of 80% by week 12 Date Initiated: 03/18/2017 Target Resolution Date: 07/19/2017 Goal Status: Active Ulcer/skin breakdown will heal within 14 weeks TORIN, MODICA ([id number]) Date Initiated: 03/18/2017 Target Resolution Date: 07/19/2017 Goal Status: Active Interventions: Assess patient/caregiver ability to perform ulcer/skin care regimen upon admission and as needed Treatment Activities: Topical wound management initiated : 03/18/2017 Notes: Electronic Signature(s) Signed: 04/01/2017 4:40:57 PM By: Elpidio Eric BSN, RN Entered By: Elpidio Eric on 04/01/2017 08:50:33 Eichel, [name] ([id number]) -------------------------------------------------------------------------------- Pain Assessment Details Patient Name: Hirota, Jahking L. Date of Service: 04/01/2017 8:15 AM Medical Record Number:  [id number] Patient Account Number: [id number] Date of Birth/Sex: [birth year] (76 y.o. Male) Treating RN: Clover Mealy, RN, BSN, [place] Sink Primary Care Janvi Ammar: Windle Guard Other Clinician: Referring Thula Stewart: Windle Guard Treating Moroni Nester/Extender: Rudene Re in Treatment: 2 Active Problems Location of Pain Severity and Description of Pain Patient Has Paino No Site Locations With Dressing Change: No Pain Management and Medication Current Pain Management: Electronic Signature(s) Signed: 04/01/2017 4:40:57 PM By: Elpidio Eric BSN, RN Entered By: Elpidio Eric on 04/01/2017 08:19:02 Boudoin, [name] ([id number]) -------------------------------------------------------------------------------- Patient/Caregiver Education Details Patient Name: Wishart, Ashlee L. Date of Service: 04/01/2017 8:15 AM Medical Record Number: [id number] Patient Account Number: [id number] Date of Birth/Gender: 1941-05-27 (76 y.o. Male) Treating RN: Clover Mealy, RN, BSN, [place] Sink Primary Care Physician: Windle Guard Other Clinician: Referring Physician: Windle Guard Treating Physician/Extender: Rudene Re in Treatment: 2 Education Assessment Education Provided To: Patient Education Topics Provided Welcome To The Wound Care Center: Methods: Explain/Verbal Responses: State content correctly Wound/Skin Impairment: Methods: Explain/Verbal Responses: State content correctly Electronic Signature(s) Signed: 04/01/2017 4:40:57 PM By: Elpidio Eric BSN, RN Entered By: Elpidio Eric on 04/01/2017 09:01:34 Chelf, [name] ([id number]) -------------------------------------------------------------------------------- Wound Assessment  Details Patient Name: Asare, Carlton L. Date of Service: 04/01/2017 8:15 AM Medical Record Number: [id number] Patient Account Number: [id number] Date of Birth/Sex: [birth year](76 y.o. Male) Treating RN: Clover MealyAfful, RN, BSN, Rita Primary Care Lexee Brashears: Windle GuardELKINS, WILSON Other Clinician: Referring  Mick Tanguma: Windle GuardELKINS, WILSON Treating Caroly Purewal/Extender: Rudene ReBritto, Errol Weeks in Treatment: 2 Wound Status Wound Number: 1 Primary Etiology: Trauma, Other Wound Location: Left, Lateral Lower Leg Wound Status: Open Wounding Event: Gradually Appeared Date Acquired: 10/13/2016 Weeks Of Treatment: 2 Clustered Wound: No Photos Photo Uploaded By: Elpidio EricAfful, Rita on 04/01/2017 16:48:31 Wound Measurements Length: (cm) 2.1 Width: (cm) 1.9 Depth: (cm) 0.1 Area: (cm) 3.134 Volume: (cm) 0.313 % Reduction in Area: 36.2% % Reduction in Volume: 68.1% Wound Description Full Thickness Without Exposed Classification: Support Structures Periwound Skin Texture Texture Color Abundis, Myrl L. ([id number]) No Abnormalities Noted: No No Abnormalities Noted: No Moisture No Abnormalities Noted: No Treatment Notes Wound #1 (Left, Lateral Lower Leg) 1. Cleansed with: Cleanse wound with antibacterial soap and water 3. Peri-wound Care: Barrier cream 4. Dressing Applied: Hydrafera Blue 5. Secondary Dressing Applied Gauze and Kerlix/Conform 7. Secured with Secretary/administratorTape Electronic Signature(s) Signed: 04/01/2017 4:40:57 PM By: Elpidio EricAfful, Rita BSN, RN Entered By: Elpidio EricAfful, Rita on 04/01/2017 08:22:30 Braud, Ferdinand LangoICHARD L. ([id number]) -------------------------------------------------------------------------------- Vitals Details Patient Name: Zenaida DeedHICE, Adan L. Date of Service: 04/01/2017 8:15 AM Medical Record Number: [id number] Patient Account Number: [id number] Date of Birth/Sex: [birth year] [age] y.o. Male) Treating RN: Clover MealyAfful, RN, BSN, Rita Primary Care Laterrance Nauta: Windle GuardELKINS, WILSON Other Clinician: Referring Ashleyann Shoun: Windle GuardELKINS, WILSON Treating Samarion Ehle/Extender: Rudene ReBritto, Errol Weeks in Treatment: 2 Vital Signs Time Taken: 08:24 Temperature (F): 97. Height (in): 72 Pulse (bpm): 62 Weight (lbs): 226 Respiratory Rate (breaths/min): 16 Body Mass Index (BMI): 30.6 Blood Pressure (mmHg): 99/48 Reference Range: 80 - 120 mg /  dl Electronic Signature(s) Signed: 04/01/2017 4:40:57 PM By: Elpidio EricAfful, Rita BSN, RN Entered By: Elpidio EricAfful, Rita on 04/01/2017 08:25:20

## 2017-04-05 ENCOUNTER — Other Ambulatory Visit: Payer: Self-pay | Admitting: Surgery

## 2017-04-05 DIAGNOSIS — S81802D Unspecified open wound, left lower leg, subsequent encounter: Secondary | ICD-10-CM

## 2017-04-06 ENCOUNTER — Ambulatory Visit (HOSPITAL_COMMUNITY)
Admission: RE | Admit: 2017-04-06 | Discharge: 2017-04-06 | Disposition: A | Discharge status: Home / Self Care | Payer: Medicare Other | Source: Ambulatory Visit | Attending: Vascular Surgery | Admitting: Vascular Surgery

## 2017-04-06 ENCOUNTER — Ambulatory Visit (INDEPENDENT_AMBULATORY_CARE_PROVIDER_SITE_OTHER)
Admission: RE | Admit: 2017-04-06 | Discharge: 2017-04-06 | Disposition: A | Discharge status: Home / Self Care | Payer: Medicare Other | Source: Ambulatory Visit | Attending: Vascular Surgery | Admitting: Vascular Surgery

## 2017-04-06 DIAGNOSIS — L97922 Non-pressure chronic ulcer of unspecified part of left lower leg with fat layer exposed: Secondary | ICD-10-CM

## 2017-04-06 DIAGNOSIS — S81802D Unspecified open wound, left lower leg, subsequent encounter: Secondary | ICD-10-CM

## 2017-04-06 DIAGNOSIS — T148XXA Other injury of unspecified body region, initial encounter: Secondary | ICD-10-CM | POA: Insufficient documentation

## 2017-04-06 LAB — VAS US ABI WITH/WO TBI
LATIBDISTSYS: -70 cm/s
LPTIBDISTSYS: -68 cm/s
LSFDPSV: -82 cm/s
Left super femoral mid sys PSV: 79 cm/s
Left super femoral prox sys PSV: 79 cm/s
RIGHT ANT DIST TIBAL SYS PSV: 80 cm/s
RIGHT POST TIB DIST SYS: -67 cm/s
RSFDPSV: -56 cm/s
RSFPPSV: 80 cm/s
Right super femoral mid sys PSV: -74 cm/s

## 2017-04-07 ENCOUNTER — Inpatient Hospital Stay (HOSPITAL_COMMUNITY): Admission: RE | Admit: 2017-04-07 | Payer: Medicare Other | Source: Ambulatory Visit

## 2017-04-08 ENCOUNTER — Encounter: Payer: Medicare Other | Attending: Surgery | Admitting: Surgery

## 2017-04-08 DIAGNOSIS — M1990 Unspecified osteoarthritis, unspecified site: Secondary | ICD-10-CM | POA: Insufficient documentation

## 2017-04-08 DIAGNOSIS — M109 Gout, unspecified: Secondary | ICD-10-CM | POA: Insufficient documentation

## 2017-04-08 DIAGNOSIS — I10 Essential (primary) hypertension: Secondary | ICD-10-CM | POA: Insufficient documentation

## 2017-04-08 DIAGNOSIS — I739 Peripheral vascular disease, unspecified: Secondary | ICD-10-CM | POA: Insufficient documentation

## 2017-04-08 DIAGNOSIS — L97822 Non-pressure chronic ulcer of other part of left lower leg with fat layer exposed: Secondary | ICD-10-CM | POA: Insufficient documentation

## 2017-04-09 NOTE — Progress Notes (Addendum)
[name] ([id number]) Visit Report for 04/08/2017 Arrival Information Details Patient Name: [name], [name]. Date of Service: 04/08/2017 8:15 AM Medical Record Number: [id number] Patient Account Number: [id number] Date of Birth/Sex: September 17, 1940 (76 y.o. Male) Treating RN: Clover Mealy, RN, BSN, [place] Sink Primary Care Maicie Vanderloop: Windle Guard Other Clinician: Referring Takyah Ciaramitaro: Windle Guard Treating Janisha Bueso/Extender: Rudene Re in Treatment: 3 Visit Information History Since Last Visit All ordered tests and consults were completed: No Patient Arrived: Ambulatory Added or deleted any medications: No Arrival Time: 08:10 Any new allergies or adverse reactions: No Accompanied By: dtr Had a fall or experienced change in No Transfer Assistance: None activities of daily living that may affect Patient Identification Verified: Yes risk of falls: Secondary Verification Process Yes Signs or symptoms of abuse/neglect since last No Completed: visito Patient Has Alerts: Yes Hospitalized since last visit: No Patient Alerts: Patient on Blood Has Dressing in Place as Prescribed: Yes Thinner Has Compression in Place as Prescribed: Yes ELIQUIS Pain Present Now: No Electronic Signature(s) Signed: 04/08/2017 8:11:09 AM By: Elpidio Eric BSN, RN Entered By: Elpidio Eric on 04/08/2017 08:11:09 Summa, [name] ([id number]) -------------------------------------------------------------------------------- Encounter Discharge Information Details Patient Name: Aerts, [name] Date of Service: 04/08/2017 8:15 AM Medical Record Number: [id number] Patient Account Number: [id number] Date of Birth/Sex: [birth year] (76 y.o. Male) Treating RN: Clover Mealy, RN, BSN, [place] Sink Primary Care Kerstin Crusoe: Windle Guard Other Clinician: Referring Easton Fetty: Windle Guard Treating Effa Yarrow/Extender: Rudene Re in Treatment: 3 Encounter Discharge Information Items Discharge Pain Level: 0 Discharge Condition:  Stable Ambulatory Status: Ambulatory Discharge Destination: Home Transportation: Private Auto Schedule Follow-up Appointment: No Medication Reconciliation completed and provided to Patient/Care No Katalena Malveaux: Provided on Clinical Summary of Care: 04/08/2017 Form Type Recipient Paper Patient [facility] Electronic Signature(s) Signed: 04/09/2017 6:39:18 PM By: Elpidio Eric BSN, RN Previous Signature: 04/08/2017 8:39:50 AM Version By: Gwenlyn Perking Entered By: Elpidio Eric on 04/08/2017 08:43:11 [name] ([id number]) -------------------------------------------------------------------------------- General Visit Notes Details Patient Name: [name], [name] Date of Service: 04/08/2017 8:15 AM Medical Record Number: [id number] Patient Account Number: [id number] Date of Birth/Sex: October 31, 1940 (76 y.o. Male) Treating RN: Clover Mealy, RN, BSN, [place] Sink Primary Care Callie Facey: Windle Guard Other Clinician: Referring Myrth Dahan: Windle Guard Treating Indy Kuck/Extender: Rudene Re in Treatment: 3 Notes Arterial and venous studies results have been discussed with patient by MD. No further interventions needed. Will continue wound care. Electronic Signature(s) Signed: 04/09/2017 6:39:18 PM By: Elpidio Eric BSN, RN Entered By: Elpidio Eric on 04/08/2017 08:35:32 [name] ([id number]) -------------------------------------------------------------------------------- Lower Extremity Assessment Details Patient Name: [name], [name] Date of Service: 04/08/2017 8:15 AM Medical Record Number: [id number] Patient Account Number: [id number] Date of Birth/Sex: 1941/02/25 (76 y.o. Male) Treating RN: Clover Mealy, RN, BSN, [place] Sink Primary Care Kentaro Alewine: Windle Guard Other Clinician: Referring Lazar Tierce: Windle Guard Treating Reuel Lamadrid/Extender: Rudene Re in Treatment: 3 Vascular Assessment Claudication: Claudication Assessment [Left:None] Pulses: Dorsalis Pedis Palpable: [Left:Yes] Posterior  Tibial Extremity colors, hair growth, and conditions: Extremity Color: [Left:Mottled] Hair Growth on Extremity: [Left:Yes] Temperature of Extremity: [Left:Warm] Capillary Refill: [Left:< 3 seconds] Electronic Signature(s) Signed: 04/08/2017 8:11:43 AM By: Elpidio Eric BSN, RN Entered By: Elpidio Eric on 04/08/2017 08:11:42 [name] ([id number]) -------------------------------------------------------------------------------- Multi Wound Chart Details Patient Name: [name], [name] Date of Service: 04/08/2017 8:15 AM Medical Record Number: [id number] Patient Account Number: [id number] Date of Birth/Sex: 1941-08-08 (76 y.o. Male) Treating RN: Clover Mealy, RN, BSN, [employer] Sink Primary Care Jovon Winterhalter: Windle Guard Other Clinician: Referring Nataki Mccrumb: Windle Guard Treating Geoffrey Mankin/Extender: Rudene Re in Treatment: 3 Vital Signs Height(in):  72 Pulse(bpm): 70 Weight(lbs): 226 Blood Pressure 89/55 (mmHg): Body Mass Index(BMI): 31 Temperature(F): 97.8 Respiratory Rate 16 (breaths/min): Photos: [1:No Photos] [2:No Photos] [N/A:N/A] Wound Location: [1:Left Lower Leg - Lateral] [2:Left Lower Leg - Anterior] [N/A:N/A] Wounding Event: [1:Gradually Appeared] [2:Shear/Friction] [N/A:N/A] Primary Etiology: [1:Trauma, Other] [2:Venous Leg Ulcer] [N/A:N/A] Comorbid History: [1:Cataracts, Arrhythmia, Hypertension, Peripheral Arterial Disease, Peripheral Venous Disease, Gout, Osteoarthritis] [2:Cataracts, Arrhythmia, Hypertension, Peripheral Arterial Disease, Peripheral Venous Disease, Gout, Osteoarthritis]  [N/A:N/A] Date Acquired: [1:10/13/2016] [2:04/05/2017] [N/A:N/A] Weeks of Treatment: [1:3] [2:0] [N/A:N/A] Wound Status: [1:Open] [2:Open] [N/A:N/A] Measurements L x W x D 1.7x1.5x0.1 [2:1.7x1.7x0.1] [N/A:N/A] (cm) Area (cm) : [1:2.003] [2:2.27] [N/A:N/A] Volume (cm) : [1:0.2] [2:0.227] [N/A:N/A] % Reduction in Area: [1:59.20%] [2:N/A] [N/A:N/A] % Reduction in Volume: 79.60% [2:N/A]  [N/A:N/A] Classification: [1:Full Thickness Without Exposed Support Structures] [2:Partial Thickness] [N/A:N/A] Exudate Amount: [1:Medium] [2:Medium] [N/A:N/A] Exudate Type: [1:Serosanguineous] [2:Serosanguineous] [N/A:N/A] Exudate Color: [1:red, brown] [2:red, brown] [N/A:N/A] Wound Margin: [1:Flat and Intact] [2:Flat and Intact] [N/A:N/A] Granulation Amount: [1:Large (67-100%)] [2:Large (67-100%)] [N/A:N/A] Granulation Quality: [1:Red] [2:N/A] [N/A:N/A] Necrotic Amount: [1:None Present (0%)] [2:None Present (0%)] [N/A:N/A] Exposed Structures: [N/A:N/A] Fat Layer (Subcutaneous Fat Layer (Subcutaneous Tissue) Exposed: Yes Tissue) Exposed: Yes Fascia: No Fascia: No Tendon: No Tendon: No Muscle: No Muscle: No Joint: No Joint: No Bone: No Bone: No Epithelialization: Small (1-33%) None N/A Debridement: Debridement (40981- N/A N/A 11047) Pain Control: Lidocaine 4% Topical N/A N/A Solution Tissue Debrided: Fibrin/Slough, Fat, N/A N/A Subcutaneous Level: Skin/Subcutaneous N/A N/A Tissue Debridement Area (sq 2.55 N/A N/A cm): Instrument: Curette N/A N/A Bleeding: Minimum N/A N/A Hemostasis Achieved: Silver Nitrate N/A N/A Procedural Pain: 0 N/A N/A Post Procedural Pain: 0 N/A N/A Debridement Treatment Procedure was tolerated N/A N/A Response: well Post Debridement 1.7x1.5x0.1 N/A N/A Measurements L x W x D (cm) Post Debridement 0.2 N/A N/A Volume: (cm) Periwound Skin Texture: Excoriation: No Excoriation: No N/A Induration: No Induration: No Callus: No Callus: No Crepitus: No Crepitus: No Rash: No Rash: No Scarring: No Scarring: No Periwound Skin Maceration: No Maceration: No N/A Moisture: Dry/Scaly: No Dry/Scaly: No Periwound Skin Color: Ecchymosis: Yes Atrophie Blanche: No N/A Atrophie Blanche: No Cyanosis: No Cyanosis: No Ecchymosis: No Erythema: No Erythema: No Hemosiderin Staining: No Hemosiderin Staining: No Mottled: No Mottled: No Pallor:  No Pallor: No Rubor: No Rubor: No Temperature: No Abnormality No Abnormality N/A Tenderness on No No N/A Palpation: Wound Preparation: N/A Swofford, Saba L. ([id number]) Ulcer Cleansing: Ulcer Cleansing: Rinsed/Irrigated with Rinsed/Irrigated with Saline Saline Topical Anesthetic Topical Anesthetic Applied: None Applied: None Procedures Performed: Debridement N/A N/A Treatment Notes Wound #1 (Left, Lateral Lower Leg) 1. Cleansed with: Cleanse wound with antibacterial soap and water 3. Peri-wound Care: Barrier cream 4. Dressing Applied: Hydrafera Blue 5. Secondary Dressing Applied Gauze and Kerlix/Conform 7. Secured with Tape Wound #2 (Left, Anterior Lower Leg) 1. Cleansed with: Cleanse wound with antibacterial soap and water 3. Peri-wound Care: Barrier cream 4. Dressing Applied: Hydrafera Blue 5. Secondary Dressing Applied Gauze and Kerlix/Conform 7. Secured with Secretary/administrator) Signed: 04/08/2017 8:44:59 AM By: Evlyn Kanner MD, FACS Entered By: Evlyn Kanner on 04/08/2017 08:44:59 Doane, [name] ([id number]) -------------------------------------------------------------------------------- Multi-Disciplinary Care Plan Details Patient Name: Scherer, [name] Date of Service: 04/08/2017 8:15 AM Medical Record Number: [id number] Patient Account Number: [id number] Date of Birth/Sex: 1941/02/18 (76 y.o. Male) Treating RN: Clover Mealy, RN, BSN, [place] Sink Primary Care Shakim Faith: Windle Guard Other Clinician: Referring Jayen Bromwell: Windle Guard Treating Devanta Daniel/Extender: Rudene Re in Treatment: 3 Active Inactive ` Orientation to the Wound Care Program  Nursing Diagnoses: Knowledge deficit related to the wound healing center program Goals: Patient/caregiver will verbalize understanding of the Wound Healing Center Program Date Initiated: 03/18/2017 Target Resolution Date: 07/19/2017 Goal Status: Active Interventions: Provide education on orientation to the  wound center Notes: ` Wound/Skin Impairment Nursing Diagnoses: Impaired tissue integrity Knowledge deficit related to ulceration/compromised skin integrity Goals: Patient/caregiver will verbalize understanding of skin care regimen Date Initiated: 03/18/2017 Target Resolution Date: 07/19/2017 Goal Status: Active Ulcer/skin breakdown will have a volume reduction of 30% by week 4 Date Initiated: 03/18/2017 Target Resolution Date: 07/19/2017 Goal Status: Active Ulcer/skin breakdown will have a volume reduction of 50% by week 8 Date Initiated: 03/18/2017 Target Resolution Date: 07/19/2017 Goal Status: Active Ulcer/skin breakdown will have a volume reduction of 80% by week 12 Date Initiated: 03/18/2017 Target Resolution Date: 07/19/2017 Goal Status: Active Ulcer/skin breakdown will heal within 14 weeks ZYMEIR, SALMINEN ([id number]) Date Initiated: 03/18/2017 Target Resolution Date: 07/19/2017 Goal Status: Active Interventions: Assess patient/caregiver ability to perform ulcer/skin care regimen upon admission and as needed Treatment Activities: Topical wound management initiated : 03/18/2017 Notes: Electronic Signature(s) Signed: 04/09/2017 6:39:18 PM By: Elpidio Eric BSN, RN Entered By: Elpidio Eric on 04/08/2017 08:31:29 Belote, [name] ([id number]) -------------------------------------------------------------------------------- Pain Assessment Details Patient Name: Circle, [name] Date of Service: 04/08/2017 8:15 AM Medical Record Number: [id number] Patient Account Number: [id number] Date of Birth/Sex: 11/02/1940 (76 y.o. Male) Treating RN: Clover Mealy, RN, BSN, [place] Sink Primary Care Johnnie Goynes: Windle Guard Other Clinician: Referring Lateka Rady: Windle Guard Treating Luvenia Cranford/Extender: Rudene Re in Treatment: 3 Active Problems Location of Pain Severity and Description of Pain Patient Has Paino No Site Locations With Dressing Change: No Pain Management and Medication Current  Pain Management: Electronic Signature(s) Signed: 04/08/2017 8:11:15 AM By: Elpidio Eric BSN, RN Entered By: Elpidio Eric on 04/08/2017 08:11:15 Shiroma, [name] ([id number]) -------------------------------------------------------------------------------- Patient/Caregiver Education Details Patient Name: James Deed L. Date of Service: 04/08/2017 8:15 AM Medical Record Number: [id number] Patient Account Number: [id number] Date of Birth/Gender: 1941-04-15 (76 y.o. Male) Treating RN: Clover Mealy, RN, BSN, [place] Sink Primary Care Physician: Windle Guard Other Clinician: Referring Physician: Windle Guard Treating Physician/Extender: Rudene Re in Treatment: 3 Education Assessment Education Provided To: Patient Education Topics Provided Welcome To The Wound Care Center: Methods: Explain/Verbal Responses: Reinforcements needed Electronic Signature(s) Signed: 04/09/2017 6:39:18 PM By: Elpidio Eric BSN, RN Entered By: Elpidio Eric on 04/08/2017 08:43:21 Cocking, [name] ([id number]) -------------------------------------------------------------------------------- Wound Assessment Details Patient Name: Kamara, [name] Date of Service: 04/08/2017 8:15 AM Medical Record Number: [id number] Patient Account Number: [id number] Date of Birth/Sex: [birth year] (76 y.o. Male) Treating RN: Clover Mealy, RN, BSN, [place] Primary Care Currie Dennin: Windle Guard Other Clinician: Referring Terius Jacuinde: Windle Guard Treating Stephanne Greeley/Extender: Rudene Re in Treatment: 3 Wound Status Wound Number: 1 Primary Trauma, Other Etiology: Wound Location: Left Lower Leg - Lateral Wound Open Wounding Event: Gradually Appeared Status: Date Acquired: 10/13/2016 Comorbid Cataracts, Arrhythmia, Hypertension, Weeks Of Treatment: 3 History: Peripheral Arterial Disease, Peripheral Clustered Wound: No Venous Disease, Gout, Osteoarthritis Photos Photo Uploaded By: Elpidio Eric on 04/08/2017 11:51:16 Wound Measurements Length:  (cm) 1.7 Width: (cm) 1.5 Depth: (cm) 0.1 Area: (cm) 2.003 Volume: (cm) 0.2 % Reduction in Area: 59.2% % Reduction in Volume: 79.6% Epithelialization: Small (1-33%) Tunneling: No Undermining: No Wound Description Full Thickness Without Exposed Foul Odor Aft Classification: Support Structures Slough/Fibrin Wound Margin: Flat and Intact Medium Ziller, [name] ([id number]) er Cleansing: No o No Exudate Amount: Exudate Type: Serosanguineous Exudate Color: red, brown Wound Bed Granulation Amount: Large (67-100%) Exposed  Structure Granulation Quality: Red Fascia Exposed: No Necrotic Amount: None Present (0%) Fat Layer (Subcutaneous Tissue) Exposed: Yes Tendon Exposed: No Muscle Exposed: No Joint Exposed: No Bone Exposed: No Periwound Skin Texture Texture Color No Abnormalities Noted: No No Abnormalities Noted: No Callus: No Atrophie Blanche: No Crepitus: No Cyanosis: No Excoriation: No Ecchymosis: Yes Induration: No Erythema: No Rash: No Hemosiderin Staining: No Scarring: No Mottled: No Pallor: No Moisture Rubor: No No Abnormalities Noted: No Dry / Scaly: No Temperature / Pain Maceration: No Temperature: No Abnormality Wound Preparation Ulcer Cleansing: Rinsed/Irrigated with Saline Topical Anesthetic Applied: None Treatment Notes Wound #1 (Left, Lateral Lower Leg) 1. Cleansed with: Cleanse wound with antibacterial soap and water 3. Peri-wound Care: Barrier cream 4. Dressing Applied: Hydrafera Blue 5. Secondary Dressing Applied Gauze and Kerlix/Conform 7. Secured with Secretary/administratorTape Electronic Signature(s) Signed: 04/09/2017 6:39:18 PM By: Elpidio EricAfful, Rita BSN, RN [name], [name] ([id number]) Entered By: Elpidio EricAfful, Rita on 04/08/2017 08:30:30 Fabrizio, James LangoICHARD L. ([id number]) -------------------------------------------------------------------------------- Wound Assessment Details Patient Name: Cerino, [name] Date of Service: 04/08/2017 8:15 AM Medical Record Number:  [id number] Patient Account Number: [id number] Date of Birth/Sex: 1940-10-21 [age] y.o. Male) Treating RN: Clover MealyAfful, RN, BSN, Rita Primary Care Yu [name]: Windle GuardELKINS, [name] Other Clinician: Referring Dayona [name]: Windle GuardELKINS, [name] Treating Ilian Wessell/Extender: Rudene ReBritto, Errol Weeks in Treatment: 3 Wound Status Wound Number: 2 Primary Venous Leg Ulcer Etiology: Wound Location: Left Lower Leg - Anterior Wound Open Wounding Event: Shear/Friction Status: Date Acquired: 04/05/2017 Comorbid Cataracts, Arrhythmia, Hypertension, Weeks Of Treatment: 0 History: Peripheral Arterial Disease, Peripheral Clustered Wound: No Venous Disease, Gout, Osteoarthritis Photos Photo Uploaded By: Elpidio EricAfful, Rita on 04/08/2017 11:51:17 Wound Measurements Length: (cm) 1.7 Width: (cm) 1.7 Depth: (cm) 0.1 Area: (cm) 2.27 Volume: (cm) 0.227 % Reduction in Area: % Reduction in Volume: Epithelialization: None Tunneling: No Undermining: No Wound Description Classification: Partial Thickness Wound Margin: Flat and Intact Exudate Amount: Medium Exudate Type: Serosanguineous Exudate Color: red, brown Foul Odor After Cleansing: No Slough/Fibrino No Wound Bed Granulation Amount: Large (67-100%) Exposed Structure Necrotic Amount: None Present (0%) Fascia Exposed: No Fat Layer (Subcutaneous Tissue) Exposed: Yes Tendon Exposed: No Rallis, Knox L. ([id number]) Muscle Exposed: No Joint Exposed: No Bone Exposed: No Periwound Skin Texture Texture Color No Abnormalities Noted: No No Abnormalities Noted: No Callus: No Atrophie Blanche: No Crepitus: No Cyanosis: No Excoriation: No Ecchymosis: No Induration: No Erythema: No Rash: No Hemosiderin Staining: No Scarring: No Mottled: No Pallor: No Moisture Rubor: No No Abnormalities Noted: No Dry / Scaly: No Temperature / Pain Maceration: No Temperature: No Abnormality Wound Preparation Ulcer Cleansing: Rinsed/Irrigated with Saline Topical Anesthetic Applied:  None Treatment Notes Wound #2 (Left, Anterior Lower Leg) 1. Cleansed with: Cleanse wound with antibacterial soap and water 3. Peri-wound Care: Barrier cream 4. Dressing Applied: Hydrafera Blue 5. Secondary Dressing Applied Gauze and Kerlix/Conform 7. Secured with Secretary/administratorTape Electronic Signature(s) Signed: 04/09/2017 6:39:18 PM By: Elpidio EricAfful, Rita BSN, RN Entered By: Elpidio EricAfful, Rita on 04/08/2017 08:20:09 Creed, James LangoICHARD L. ([id number]) -------------------------------------------------------------------------------- Vitals Details Patient Name: Buhrman, [name] Date of Service: 04/08/2017 8:15 AM Medical Record Number: [id number] Patient Account Number: [id number] Date of Birth/Sex: 1940-10-21 [age] y.o. Male) Treating RN: Clover MealyAfful, RN, BSN, Rita Primary Care Nakeisha Greenhouse: Windle GuardELKINS, [name] Other Clinician: Referring Carolyn Maniscalco: Windle GuardELKINS, [name] Treating Audreanna Torrisi/Extender: Rudene ReBritto, Errol Weeks in Treatment: 3 Vital Signs Time Taken: 08:13 Temperature (F): 97.8 Height (in): 72 Pulse (bpm): 70 Weight (lbs): 226 Respiratory Rate (breaths/min): 16 Body Mass Index (BMI): 30.6 Blood Pressure (mmHg): 89/55 Reference Range: 80 - 120 mg / dl Electronic Signature(s)  Signed: 04/09/2017 6:39:18 PM By: Elpidio EricAfful, Rita BSN, RN Entered By: Elpidio EricAfful, Rita on 04/08/2017 08:16:53

## 2017-04-12 NOTE — Progress Notes (Signed)
EGON, [name] ([id number]) Visit Report for 04/08/2017 Chief Complaint Document Details Patient Name: [name], [name]. Date of Service: 04/08/2017 8:15 AM Medical Record Number: [id number] Patient Account Number: [id number] Date of Birth/Sex: 01/26/1941 (76 y.o. Male) Treating RN: Clover Mealy, RN, BSN, [place] Sink Primary Care Provider: Windle Guard Other Clinician: Referring Provider: Windle Guard Treating Provider/Extender: Rudene Re in Treatment: 3 Information Obtained from: Patient Chief Complaint Left LE Ulcer Electronic Signature(s) Signed: 04/08/2017 8:45:49 AM By: Evlyn Kanner MD, FACS Entered By: Evlyn Kanner on 04/08/2017 08:45:48 Baillargeon, [name] ([id number]) -------------------------------------------------------------------------------- Debridement Details Patient Name: [name], [name]. Date of Service: 04/08/2017 8:15 AM Medical Record Number: [id number] Patient Account Number: [id number] Date of Birth/Sex: December 10, 1940 (76 y.o. Male) Treating RN: Afful, RN, BSN, Rita Primary Care Provider: Windle Guard Other Clinician: Referring Provider: Windle Guard Treating Provider/Extender: Rudene Re in Treatment: 3 Debridement Performed for Wound #1 Left,Lateral Lower Leg Assessment: Performed By: Physician Evlyn Kanner, MD Debridement: Debridement Pre-procedure Verification/Time Out Yes - 08:30 Taken: Start Time: 08:31 Pain Control: Lidocaine 4% Topical Solution Level: Skin/Subcutaneous Tissue Total Area Debrided ([name] x 1.7 (cm) x 1.5 (cm) = 2.55 (cm) W): Tissue and other Viable, Non-Viable, Fat, Fibrin/Slough, Subcutaneous material debrided: Instrument: Curette Bleeding: Minimum Hemostasis Achieved: Silver Nitrate End Time: 08:32 Procedural Pain: 0 Post Procedural Pain: 0 Response to Treatment: Procedure was tolerated well Post Debridement Measurements of Total Wound Length: (cm) 1.7 Width: (cm) 1.5 Depth: (cm) 0.1 Volume: (cm) 0.2 Character  of Wound/Ulcer Post Stable Debridement: Post Procedure Diagnosis Same as Pre-procedure Electronic Signature(s) Signed: 04/08/2017 8:45:33 AM By: Evlyn Kanner MD, FACS Signed: 04/09/2017 6:39:18 PM By: Elpidio Eric BSN, RN Entered By: Evlyn Kanner on 04/08/2017 08:45:33 Gallardo, [name] ([id number]) -------------------------------------------------------------------------------- HPI Details Patient Name: [name], [name]. Date of Service: 04/08/2017 8:15 AM Medical Record Number: [id number] Patient Account Number: [id number] Date of Birth/Sex: [birth year] (76 y.o. Male) Treating RN: Clover Mealy, RN, BSN, [place] Sink Primary Care Provider: Windle Guard Other Clinician: Referring Provider: Windle Guard Treating Provider/Extender: Rudene Re in Treatment: 3 History of Present Illness HPI Description: 03/18/17 on evaluation today patient presents for initial visit concerning an ulcer he has on the left lateral lower extremity which has been present since mid February 2018. Unfortunately due to other family circumstances evaluation and treatment of this wound has been put on hold until this point when they could finally get him into be seen. He does have high blood pressure but has no history of diabetes though he does tell me that his cardiologist had been monitoring him for hyperglycemia and he is "borderline". He does have some discomfort in regard to this wound but fortunately this is not severe and typically with cleansing. His main concern is why this wound is not healing as in the past he has never had any difficulty with healing. The initial injury was during the period of time where he was helping a friend with some work and scrape the leg on a brick during that process. It has just never healed since that time. Patient is on chronic anticoagulant therapy and has not had any cardiovascular procedures such as arterial or venous imaging. 03/25/2017 - I'm seeing the patient for the first time  today and notes that he has had a injury to the left lower extremity since mid February and from what I understand a hematoma opened out into a lacerated wound and this has been slow to heal. He has had no history of previous arterial or venous problems and has  had some cardiac history with stents placed. He is not a diabetic and not a smoker. During his previous visit there has been a arterial and venous duplex study ordered and this is pending 04/01/2017 -- arterial and venous duplex studies still pending 04/08/2017 -- lower extremity venous duplex reflux evaluation done on 04/02/2017 showed no evidence of venous incompetence in both left and right great saphenous veins. lower extremity arterial duplex evaluation done on 04/06/2017 -- no evidence of hemodynamically significant arterial occlusive disease bilaterally. the right ABI was 1.19 with a toe pressure of 0.83 and the left ABI was 1.25 with a toe pressures of 0.78 and triphasic flow. these were all normal. Electronic Signature(s) Signed: 04/08/2017 8:45:57 AM By: Evlyn Kanner MD, FACS Previous Signature: 04/08/2017 8:44:27 AM Version By: Evlyn Kanner MD, FACS Entered By: Evlyn Kanner on 04/08/2017 08:45:57 Limon, [name] ([id number]) -------------------------------------------------------------------------------- Physical Exam Details Patient Name: [name], [name]. Date of Service: 04/08/2017 8:15 AM Medical Record Number: [id number] Patient Account Number: [id number] Date of Birth/Sex: 29-Jan-1941 (76 y.o. Male) Treating RN: Clover Mealy, RN, BSN, [place] Sink Primary Care Provider: Windle Guard Other Clinician: Referring Provider: Windle Guard Treating Provider/Extender: Rudene Re in Treatment: 3 Constitutional . Pulse regular. Respirations normal and unlabored. Afebrile. . Eyes Nonicteric. Reactive to light. Ears, Nose, Mouth, and Throat Lips, teeth, and gums WNL.[name] Moist mucosa without lesions. Neck supple and nontender. No  palpable supraclavicular or cervical adenopathy. Normal sized without goiter. Respiratory WNL. No retractions.. Cardiovascular Pedal Pulses WNL. No clubbing, cyanosis or edema. Lymphatic No adneopathy. No adenopathy. No adenopathy. Musculoskeletal Adexa without tenderness or enlargement.. Digits and nails w/o clubbing, cyanosis, infection, petechiae, ischemia, or inflammatory conditions.. Integumentary (Hair, Skin) No suspicious lesions. No crepitus or fluctuance. No peri-wound warmth or erythema. No masses.[name] Psychiatric Judgement and insight Intact.. No evidence of depression, anxiety, or agitation.. Notes the main wound has some eschar and subcutaneous debris which I sharply removed and under this there is good resolution of the size of his wound. He has had a small tape burn medial to the original wound. Silver nitrate was used to achieve hemostasis. Electronic Signature(s) Signed: 04/08/2017 8:46:31 AM By: Evlyn Kanner MD, FACS Entered By: Evlyn Kanner on 04/08/2017 08:46:31 Neyman, [name] ([id number]) -------------------------------------------------------------------------------- Physician Orders Details Patient Name: [name], [name]. Date of Service: 04/08/2017 8:15 AM Medical Record Number: [id number] Patient Account Number: [id number] Date of Birth/Sex: 05/04/1941 (76 y.o. Male) Treating RN: Clover Mealy, RN, BSN, [place] Sink Primary Care Provider: Windle Guard Other Clinician: Referring Provider: Windle Guard Treating Provider/Extender: Rudene Re in Treatment: 3 Verbal / Phone Orders: No Diagnosis Coding Wound Cleansing Wound #1 Left,Lateral Lower Leg o Cleanse wound with mild soap and water o May shower with protection. Anesthetic Wound #1 Left,Lateral Lower Leg o Topical Lidocaine 4% cream applied to wound bed prior to debridement Skin Barriers/Peri-Wound Care Wound #1 Left,Lateral Lower Leg o Barrier cream o Moisturizing lotion Primary Wound  Dressing Wound #1 Left,Lateral Lower Leg o Hydrafera Blue Secondary Dressing Wound #1 Left,Lateral Lower Leg o Gauze and Kerlix/Conform Dressing Change Frequency Wound #1 Left,Lateral Lower Leg o Change dressing every other day. Follow-up Appointments Wound #1 Left,Lateral Lower Leg o Return Appointment in 1 week. Edema Control Wound #1 Left,Lateral Lower Leg o Elevate legs to the level of the heart and pump ankles as often as possible [name], [name]. ([id number]) Additional Orders / Instructions Wound #1 Left,Lateral Lower Leg o Increase protein intake. o Activity as tolerated Electronic Signature(s) Signed: 04/08/2017 4:24:31 PM By: Evlyn Kanner  MD, FACS Signed: 04/09/2017 6:39:18 PM By: Elpidio EricAfful, Rita BSN, RN Entered By: Elpidio EricAfful, Rita on 04/08/2017 08:34:21 Wilton, James LangoICHARD [name]. ([id number]) -------------------------------------------------------------------------------- Problem List Details Patient Name: Docter, Srihith [name]. Date of Service: 04/08/2017 8:15 AM Medical Record Number: [id number] Patient Account Number: [id number] Date of Birth/Sex: 10/20/1940 [age] y.o. 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  ([id number]) -------------------------------------------------------------------------------- Progress Note Details Patient Name: Julian, Kaulana [name]. Date of Service: 04/08/2017 8:15 AM Medical Record Number: [id number] Patient Account Number: [id number] Date of Birth/Sex: 10/20/1940 [age] y.o. Male) Treating RN: Clover MealyAfful, RN, BSN, [place] Sinkita Primary Care Provider: Windle GuardELKINS, WILSON Other Clinician: Referring Provider: Windle GuardELKINS, WILSON Treating Provider/Extender: Rudene ReBritto, Jahbari Repinski Weeks in Treatment: 3 Subjective Chief Complaint Information obtained from Patient Left LE Ulcer History of Present Illness (HPI) 03/18/17 on evaluation today patient presents for initial visit concerning an ulcer he has on the left lateral lower extremity which has been present since mid February 2018. Unfortunately due to other family circumstances evaluation and treatment of this wound has been put on hold until this point when they could finally get him into be seen. He does have high blood pressure but has no history of diabetes though he does tell me that his cardiologist had been monitoring him for hyperglycemia and he is "borderline". He does have some discomfort in regard to this wound but fortunately this is not severe and typically with cleansing. His main concern is why this wound is not healing as in the past he has never had any difficulty with healing. The initial injury was during the period of time where he was helping a friend with some work and scrape the leg on a brick during that process. It has just never healed since that time. Patient is on chronic anticoagulant therapy and has not had any cardiovascular procedures such as arterial or venous imaging. 03/25/2017 - I'm seeing the patient for the first time today and notes that he has had a injury to the left lower extremity since mid February and from what I understand a hematoma opened out into a lacerated wound and this has been slow to heal. He has had no history  of previous arterial or venous problems and has had some cardiac history with stents placed. He is not a diabetic and not a smoker. During his previous visit there has been a arterial and venous duplex study ordered and this is pending 04/01/2017 -- arterial and venous duplex studies still pending 04/08/2017 -- lower extremity venous duplex reflux evaluation done on 04/02/2017 showed no evidence of venous incompetence in both left and right great saphenous veins. lower extremity arterial duplex evaluation done on 04/06/2017 -- no evidence of hemodynamically significant arterial occlusive disease bilaterally. the right ABI was 1.19 with a toe pressure of 0.83 and the left ABI was 1.25 with a toe pressures of 0.78 and triphasic flow. these were all normal. Jaye, Lakyn [name]. ([id number]) Objective Constitutional Pulse regular. Respirations normal and unlabored. Afebrile. Vitals Time Taken: 8:13 AM, Height:  72 in, Weight: 226 lbs, BMI: 30.6, Temperature: 97.8 F, Pulse: 70 bpm, Respiratory Rate: 16 breaths/min, Blood Pressure: 89/55 mmHg. Eyes Nonicteric. Reactive to light. Ears, Nose, Mouth, and Throat Lips, teeth, and gums WNL.[name] Moist mucosa without lesions. Neck supple and nontender. No palpable supraclavicular or cervical adenopathy. Normal sized without goiter. Respiratory WNL. No retractions.. Cardiovascular Pedal Pulses WNL. No clubbing, cyanosis or edema. Lymphatic No adneopathy. No adenopathy. No adenopathy. Musculoskeletal Adexa without tenderness or enlargement.. Digits and nails w/o clubbing, cyanosis, infection, petechiae, ischemia, or inflammatory conditions.[name] Psychiatric Judgement and insight Intact.. No evidence of depression, anxiety, or agitation.. General Notes: the main wound has some eschar and subcutaneous debris which I sharply removed and under this there is good resolution of the size of his wound. He has had a small tape burn medial to the original wound. Silver  nitrate was used to achieve hemostasis. Integumentary (Hair, Skin) No suspicious lesions. No crepitus or fluctuance. No peri-wound warmth or erythema. No masses.. Wound #1 status is Open. Original cause of wound was Gradually Appeared. The wound is located on the Left,Lateral Lower Leg. The wound measures 1.7cm length x 1.5cm width x 0.1cm depth; 2.003cm^2 area and 0.2cm^3 volume. There is Fat Layer (Subcutaneous Tissue) Exposed exposed. There is no tunneling or undermining noted. There is a medium amount of serosanguineous drainage noted. The wound margin is flat and intact. There is large (67-100%) red granulation within the wound bed. There is no necrotic tissue within the wound bed. The periwound skin appearance exhibited: Ecchymosis. The periwound skin appearance did not exhibit: Callus, Crepitus, Excoriation, Induration, Rash, Scarring, Dry/Scaly, Maceration, Captain, Makena [name]. ([id number]) Atrophie Blanche, Cyanosis, Hemosiderin Staining, Mottled, Pallor, Rubor, Erythema. Periwound temperature was noted as No Abnormality. Wound #2 status is Open. Original cause of wound was Shear/Friction. The wound is located on the Left,Anterior Lower Leg. The wound measures 1.7cm length x 1.7cm width x 0.1cm depth; 2.27cm^2 area and 0.227cm^3 volume. There is Fat Layer (Subcutaneous Tissue) Exposed exposed. There is no tunneling or undermining noted. There is a medium amount of serosanguineous drainage noted. The wound margin is flat and intact. There is large (67-100%) granulation within the wound bed. There is no necrotic tissue within the wound bed. The periwound skin appearance did not exhibit: Callus, Crepitus, Excoriation, Induration, Rash, Scarring, Dry/Scaly, Maceration, Atrophie Blanche, Cyanosis, Ecchymosis, Hemosiderin Staining, Mottled, Pallor, Rubor, Erythema. Periwound temperature was noted as No Abnormality. Assessment Active Problems ICD-10 [phone] - Non-pressure chronic ulcer of other  part of left lower leg with fat layer exposed I10 - Essential (primary) hypertension Procedures Wound #1 Pre-procedure diagnosis of Wound #1 is a Trauma, Other located on the Left,Lateral Lower Leg . There was a Skin/Subcutaneous Tissue Debridement ([id number]) debridement with total area of 2.55 sq cm performed by Evlyn Kanner, MD. with the following instrument(s): Curette to remove Viable and Non-Viable tissue/material including Fat Layer (and Subcutaneous Tissue) Exposed, Fibrin/Slough, and Subcutaneous after achieving pain control using Lidocaine 4% Topical Solution. A time out was conducted at 08:30, prior to the start of the procedure. A Minimum amount of bleeding was controlled with Silver Nitrate. The procedure was tolerated well with a pain level of 0 throughout and a pain level of 0 following the procedure. Post Debridement Measurements: 1.7cm length x 1.5cm width x 0.1cm depth; 0.2cm^3 volume. Character of Wound/Ulcer Post Debridement is stable. Post procedure Diagnosis Wound #1: Same as Pre-Procedure Plan Brissette, Jonatan [name]. ([id number]) Wound Cleansing: Wound #1 Left,Lateral Lower Leg: Cleanse wound with mild soap and  water May shower with protection. Anesthetic: Wound #1 Left,Lateral Lower Leg: Topical Lidocaine 4% cream applied to wound bed prior to debridement Skin Barriers/Peri-Wound Care: Wound #1 Left,Lateral Lower Leg: Barrier cream Moisturizing lotion Primary Wound Dressing: Wound #1 Left,Lateral Lower Leg: Hydrafera Blue Secondary Dressing: Wound #1 Left,Lateral Lower Leg: Gauze and Kerlix/Conform Dressing Change Frequency: Wound #1 Left,Lateral Lower Leg: Change dressing every other day. Follow-up Appointments: Wound #1 Left,Lateral Lower Leg: Return Appointment in 1 week. Edema Control: Wound #1 Left,Lateral Lower Leg: Elevate legs to the level of the heart and pump ankles as often as possible Additional Orders / Instructions: Wound #1 Left,Lateral  Lower Leg: Increase protein intake. Activity as tolerated since his arterial and venous duplex studies are well within normal limits and [name] not recommend any compression. After a thorough review I have recommended: 1. Elevation and exercise 2. Hydrofera Blue to be applied every other day with a Kerlix and Coban dressing. we will make sure we [name] not use tape on his skin. 3. Arterial and venous duplex studies --results discussed with him and he is pleased to note that they're within normal limits. 4. Regular visits to the wound center. the patient has had all questions answered and will be compliant. TYWAUN, HILTNER ([id number]) Electronic Signature(s) Signed: 04/08/2017 8:47:57 AM By: Evlyn Kanner MD, FACS Entered By: Evlyn Kanner on 04/08/2017 08:47:57 Longie, [name] ([id number]) -------------------------------------------------------------------------------- SuperBill Details Patient Name: Capelle, Syre [name]. Date of Service: 04/08/2017 Medical Record Number: [id number] Patient Account Number: [id number] Date of Birth/Sex: November 01, 1940 (76 y.o. Male) Treating RN: Clover Mealy, RN, BSN, Rita Primary Care Provider: Windle Guard Other Clinician: Referring Provider: Windle Guard Treating Provider/Extender: Rudene Re in Treatment: 3 Diagnosis Coding ICD-10 Codes Code Description [phone] Non-pressure chronic ulcer of other part of left lower leg with fat layer exposed I10 Essential (primary) hypertension Facility Procedures CPT4: Description Modifier Quantity Code [id number] 11042 - DEB SUBQ TISSUE 20 SQ CM/< 1 ICD-10 Description Diagnosis L97.822 Non-pressure chronic ulcer of other part of left lower leg with fat layer exposed I10 Essential (primary) hypertension Physician Procedures CPT4: Description Modifier Quantity Code [id number] WC PHYS LEVEL 3 o NEW PT 25 1 ICD-10 Description Diagnosis L97.822 Non-pressure chronic ulcer of other part of left lower leg with fat layer exposed I10  Essential (primary) hypertension CPT4: [id number] 11042 - WC PHYS SUBQ TISS 20 SQ CM 1 ICD-10 Description Diagnosis L97.822 Non-pressure chronic ulcer of other part of left lower leg with fat layer exposed I10 Essential (primary) hypertension Electronic Signature(s) Signed: 04/08/2017 8:50:14 AM By: Evlyn Kanner MD, FACS BERLEY, GAMBRELL ([id number]) Previous Signature: 04/08/2017 8:48:35 AM Version By: Evlyn Kanner MD, FACS Entered By: Evlyn Kanner on 04/08/2017 08:50:13

## 2017-04-15 ENCOUNTER — Encounter: Payer: Medicare Other | Admitting: Physician Assistant

## 2017-04-15 DIAGNOSIS — L97822 Non-pressure chronic ulcer of other part of left lower leg with fat layer exposed: Secondary | ICD-10-CM | POA: Diagnosis not present

## 2017-04-17 NOTE — Progress Notes (Signed)
Monico BlitzHICE, Adaiah L. ([id number]) Visit Report for 04/15/2017 Chief Complaint Document Details Patient Name: Winton, [name] L. Date of Service: 04/15/2017 9:15 AM Medical Record Number: [id number] Patient Account Number: [id number] Date of Birth/Sex: 22-Nov-1940 [age] y.o. Male) Treating RN: Huel CoventryWoody, Kim Primary Care Provider: Windle GuardELKINS, WILSON Other Clinician: Referring Provider: Windle GuardELKINS, WILSON Treating Provider/Extender: Linwood DibblesSTONE III, Hilberto Burzynski Weeks in Treatment: 4 Information Obtained from: Patient Chief Complaint Left LE Ulcer Electronic Signature(s) Signed: 04/16/2017 10:18:42 AM By: Lenda KelpStone III, Nakeitha Milligan PA-C Entered By: Lenda KelpStone III, Jedediah Noda on 04/15/2017 09:49:05 Lemus, Ferdinand LangoICHARD L. ([id number]) -------------------------------------------------------------------------------- HPI Details Patient Name: Zenaida DeedHICE, Mance L. Date of Service: 04/15/2017 9:15 AM Medical Record Number: [id number] Patient Account Number: [id number] Date of Birth/Sex: 22-Nov-1940 [age] y.o. Male) Treating RN: Huel CoventryWoody, Kim Primary Care Provider: Windle GuardELKINS, WILSON Other Clinician: Referring Provider: Windle GuardELKINS, WILSON Treating Provider/Extender: Linwood DibblesSTONE III, Sueo Cullen Weeks in Treatment: 4 History of Present Illness HPI Description: 03/18/17 on evaluation today patient presents for initial visit concerning an ulcer he has on the left lateral lower extremity which has been present since mid February 2018. Unfortunately due to other family circumstances evaluation and treatment of this wound has been put on hold until this point when they could finally get him into be seen. He does have high blood pressure but has no history of diabetes though he does tell me that his cardiologist had been monitoring him for hyperglycemia and he is "borderline". He does have some discomfort in regard to this wound but fortunately this is not severe and typically with cleansing. His main concern is why this wound is not healing as in the past he has never had any difficulty with  healing. The initial injury was during the period of time where he was helping a friend with some work and scrape the leg on a brick during that process. It has just never healed since that time. Patient is on chronic anticoagulant therapy and has not had any cardiovascular procedures such as arterial or venous imaging. 03/25/2017 - I'm seeing the patient for the first time today and notes that he has had a injury to the left lower extremity since mid February and from what I understand a hematoma opened out into a lacerated wound and this has been slow to heal. He has had no history of previous arterial or venous problems and has had some cardiac history with stents placed. He is not a diabetic and not a smoker. During his previous visit there has been a arterial and venous duplex study ordered and this is pending 04/01/2017 -- arterial and venous duplex studies still pending 04/08/2017 -- lower extremity venous duplex reflux evaluation done on 04/02/2017 showed no evidence of venous incompetence in both left and right great saphenous veins. lower extremity arterial duplex evaluation done on 04/06/2017 -- no evidence of hemodynamically significant arterial occlusive disease bilaterally. the right ABI was 1.19 with a toe pressure of 0.83 and the left ABI was 1.25 with a toe pressures of 0.78 and triphasic flow. these were all normal. 04/15/17 on evaluation today patient appears to be doing fairly well in regard to his right lower extremity wound. He has been tolerating the dressing without complication. His biggest thing is that he states he was "hoping that today would be the last visit and he will not have to come back". Nonetheless he still has an open wound noted and I explained to him that we do want to see him back to obviously work toward getting this to close appropriately. Fortunately we did have  the results as noted above of his arterial duplex study and venous studies which appear to be  doing very well. Overall I do believe he is progressing nicely. No fevers, chills, nausea, or vomiting noted at this time. Electronic Signature(s) Signed: 04/16/2017 10:18:42 AM By: Lenda Kelp PA-C Entered By: Lenda Kelp on 04/15/2017 09:50:35 Gaskins, [name] ([id number]) -------------------------------------------------------------------------------- Physical Exam Details Patient Name: Magnussen, Lyall L. Date of Service: 04/15/2017 9:15 AM Medical Record Number: [id number] Patient Account Number: [id number] Date of Birth/Sex: 1940-09-22 (76 y.o. Male) Treating RN: Huel Coventry Primary Care Provider: Windle Guard Other Clinician: Referring Provider: Windle Guard Treating Provider/Extender: STONE III, Birney Belshe Weeks in Treatment: 4 Constitutional Well-nourished and well-hydrated in no acute distress. Respiratory normal breathing without difficulty. clear to auscultation bilaterally. Cardiovascular regular rate and rhythm with normal S1, S2. Psychiatric this patient is able to make decisions and demonstrates good insight into disease process. Alert and Oriented x 3. pleasant and cooperative. Notes Patient's wound appears to be doing well today and there was no evidence of slough covering just slightly hyper granular and no debridement was required. Electronic Signature(s) Signed: 04/16/2017 10:18:42 AM By: Lenda Kelp PA-C Entered By: Lenda Kelp on 04/15/2017 09:51:38 Silguero, [name] ([id number]) -------------------------------------------------------------------------------- Physician Orders Details Patient Name: Rodocker, Cade L. Date of Service: 04/15/2017 9:15 AM Medical Record Number: [id number] Patient Account Number: [id number] Date of Birth/Sex: 08/03/1941 (76 y.o. Male) Treating RN: Huel Coventry Primary Care Provider: Windle Guard Other Clinician: Referring Provider: Windle Guard Treating Provider/Extender: Linwood Dibbles, Adewale Pucillo Weeks in Treatment: 4 Verbal /  Phone Orders: No Diagnosis Coding ICD-10 Coding Code Description [phone] Non-pressure chronic ulcer of other part of left lower leg with fat layer exposed I10 Essential (primary) hypertension Wound Cleansing Wound #1 Left,Lateral Lower Leg o Cleanse wound with mild soap and water o May shower with protection. Anesthetic Wound #1 Left,Lateral Lower Leg o Topical Lidocaine 4% cream applied to wound bed prior to debridement Skin Barriers/Peri-Wound Care Wound #1 Left,Lateral Lower Leg o Barrier cream o Moisturizing lotion Primary Wound Dressing Wound #1 Left,Lateral Lower Leg o Hydrafera Blue Secondary Dressing Wound #1 Left,Lateral Lower Leg o Gauze and Kerlix/Conform Dressing Change Frequency Wound #1 Left,Lateral Lower Leg o Change dressing every other day. Follow-up Appointments Wound #1 Left,Lateral Lower Leg o Return Appointment in 1 week. Koopman, MAJOUR FREI ([id number]) Edema Control Wound #1 Left,Lateral Lower Leg o Elevate legs to the level of the heart and pump ankles as often as possible Additional Orders / Instructions Wound #1 Left,Lateral Lower Leg o Increase protein intake. o Activity as tolerated Notes I'm going to recommend that we continue with the current wound care orders for the next week. We will see him for reevaluation at that point to see were things stand. Hopefully this will continue to improve day by day as it has been. I did advise patient that I do recommend we continue to come back for weekly checks although we will attempt to get him discharged and healed as quickly as possible which again is always the goal. Electronic Signature(s) Signed: 04/16/2017 10:18:42 AM By: Lenda Kelp PA-C Entered By: Lenda Kelp on 04/15/2017 09:52:24 Echeverry, [name] ([id number]) -------------------------------------------------------------------------------- Problem List Details Patient Name: Saxby, Trampus L. Date of Service:  04/15/2017 9:15 AM Medical Record Number: [id number] Patient Account Number: [id number] Date of Birth/Sex: [birth year] (76 y.o. 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 Date of Service: 04/15/2017 9:15 AM Medical Record Number: [id number] Patient Account Number: [id number] Date of Birth/Sex: 16-Sep-1940 (76 y.o. Male) Treating RN: Huel Coventry Primary Care Provider: Windle Guard Other Clinician: Referring Provider: Windle Guard Treating Provider/Extender: Linwood Dibbles, Azell Bill Weeks in Treatment: 4 Subjective Chief Complaint Information obtained from Patient Left LE Ulcer History of Present Illness (HPI) 03/18/17 on evaluation today patient presents for initial visit concerning an ulcer he has on the left lateral lower extremity which has been present since mid February 2018. Unfortunately due to other family circumstances evaluation and treatment of this wound has been put on hold until this point when they could finally get him into be seen. He does have high blood pressure but has no history of diabetes though he does tell me that his cardiologist had been monitoring him for  hyperglycemia and he is "borderline". He does have some discomfort in regard to this wound but fortunately this is not severe and typically with cleansing. His main concern is why this wound is not healing as in the past he has never had any difficulty with healing. The initial injury was during the period of time where he was helping a friend with some work and scrape the leg on a brick during that process. It has just never healed since that time. Patient is on chronic anticoagulant therapy and has not had any cardiovascular procedures such as arterial or venous imaging. 03/25/2017 - I'm seeing the patient for the first time today and notes that he has had a injury to the left lower extremity since mid February and from what I understand a hematoma opened out into a lacerated wound and this has been slow to heal. He has had no history of previous arterial or venous problems and has had some cardiac history with stents placed. He is not a diabetic and not a smoker. During his previous visit there has been a arterial and venous duplex study ordered and this is pending 04/01/2017 -- arterial and venous duplex studies still pending 04/08/2017 -- lower extremity venous duplex reflux evaluation done on 04/02/2017 showed no evidence of venous incompetence in both left and right great saphenous veins. lower extremity arterial duplex evaluation done on 04/06/2017 -- no evidence of hemodynamically significant arterial occlusive disease bilaterally. the right ABI was 1.19 with a toe pressure of 0.83 and the left ABI was 1.25 with a toe pressures of 0.78 and triphasic flow. these were all normal. 04/15/17 on evaluation today patient appears to be doing fairly well in regard to his right lower extremity wound. He has been tolerating the dressing without complication. His biggest thing is that he states he was "hoping that today would be the last visit and he will not have to come back". Nonetheless he still  has an open wound noted and I explained to him that we do want to see him back to obviously work toward getting this to close appropriately. Fortunately we did have the results as noted above of his  arterial duplex study and venous studies which appear to be doing very well. Overall I do believe he is progressing nicely. No fevers, chills, nausea, or vomiting noted at this time. Mole, Alverto L. ([id number]) Objective Constitutional Well-nourished and well-hydrated in no acute distress. Vitals Time Taken: 9:24 AM, Height: 72 in, Weight: 226 lbs, BMI: 30.6, Temperature: 97.9 F, Pulse: 69 bpm, Respiratory Rate: 16 breaths/min, Blood Pressure: 106/65 mmHg. Respiratory normal breathing without difficulty. clear to auscultation bilaterally. Cardiovascular regular rate and rhythm with normal S1, S2. Psychiatric this patient is able to make decisions and demonstrates good insight into disease process. Alert and Oriented x 3. pleasant and cooperative. General Notes: Patient's wound appears to be doing well today and there was no evidence of slough covering just slightly hyper granular and no debridement was required. Integumentary (Hair, Skin) Wound #1 status is Open. Original cause of wound was Gradually Appeared. The wound is located on the Left,Lateral Lower Leg. The wound measures 0.9cm length x 0.8cm width x 0.1cm depth; 0.565cm^2 area and 0.057cm^3 volume. There is Fat Layer (Subcutaneous Tissue) Exposed exposed. There is no tunneling or undermining noted. There is a medium amount of serosanguineous drainage noted. The wound margin is flat and intact. There is large (67-100%) red granulation within the wound bed. There is no necrotic tissue within the wound bed. The periwound skin appearance exhibited: Induration, Ecchymosis. The periwound skin appearance did not exhibit: Callus, Crepitus, Excoriation, Rash, Scarring, Dry/Scaly, Maceration, Atrophie Blanche, Cyanosis, Hemosiderin Staining,  Mottled, Pallor, Rubor, Erythema. Periwound temperature was noted as No Abnormality. Wound #2 status is Healed - Epithelialized. Original cause of wound was Shear/Friction. The wound is located on the Left,Anterior Lower Leg. The wound measures 0cm length x 0cm width x 0cm depth; 0cm^2 area and 0cm^3 volume. There is no tunneling or undermining noted. Assessment Valeri, JAHLIL ZILLER ([id number]) Active Problems ICD-10 [phone] - Non-pressure chronic ulcer of other part of left lower leg with fat layer exposed I10 - Essential (primary) hypertension Plan Wound Cleansing: Wound #1 Left,Lateral Lower Leg: Cleanse wound with mild soap and water May shower with protection. Anesthetic: Wound #1 Left,Lateral Lower Leg: Topical Lidocaine 4% cream applied to wound bed prior to debridement Skin Barriers/Peri-Wound Care: Wound #1 Left,Lateral Lower Leg: Barrier cream Moisturizing lotion Primary Wound Dressing: Wound #1 Left,Lateral Lower Leg: Hydrafera Blue Secondary Dressing: Wound #1 Left,Lateral Lower Leg: Gauze and Kerlix/Conform Dressing Change Frequency: Wound #1 Left,Lateral Lower Leg: Change dressing every other day. Follow-up Appointments: Wound #1 Left,Lateral Lower Leg: Return Appointment in 1 week. Edema Control: Wound #1 Left,Lateral Lower Leg: Elevate legs to the level of the heart and pump ankles as often as possible Additional Orders / Instructions: Wound #1 Left,Lateral Lower Leg: Increase protein intake. Activity as tolerated General Notes: I'm going to recommend that we continue with the current wound care orders for the next week. We will see him for reevaluation at that point to see were things stand. Hopefully this will continue to improve day by day as it has been. I did advise patient that I do recommend we continue to come back for weekly checks although we will attempt to get him discharged and healed as quickly as possible which again is always the goal. SHERREL, SHAFER ([id number]) Electronic Signature(s) Signed: 04/16/2017 10:18:42 AM By: Lenda Kelp PA-C Entered By: Lenda Kelp on 04/15/2017 09:52:33 Delmar, [name] ([id number]) -------------------------------------------------------------------------------- SuperBill Details Patient Name: Cue, Breven L. Date of Service: 04/15/2017 Medical Record Number: [id number] Patient Account Number:  [id number] Date of Birth/Sex: 25-Feb-1941 (76 y.o. Male) Treating RN: Huel Coventry Primary Care Provider: Windle Guard Other Clinician: Referring Provider: Windle Guard Treating Provider/Extender: Linwood Dibbles, Tamina Cyphers Weeks in Treatment: 4 Diagnosis Coding ICD-10 Codes Code Description [phone] Non-pressure chronic ulcer of other part of left lower leg with fat layer exposed I10 Essential (primary) hypertension Facility Procedures CPT4 Code: [id number] Description: 99213 - WOUND CARE VISIT-LEV 3 EST PT Modifier: Quantity: 1 Physician Procedures CPT4: Description Modifier Quantity Code [id number] 99213 - WC PHYS LEVEL 3 - EST PT 1 ICD-10 Description Diagnosis L97.822 Non-pressure chronic ulcer of other part of left lower leg with fat layer exposed I10 Essential (primary) hypertension Electronic Signature(s) Signed: 04/16/2017 10:18:42 AM By: Lenda Kelp PA-C Entered By: Lenda Kelp on 04/15/2017 09:52:49

## 2017-04-17 NOTE — Progress Notes (Signed)
[name], [name] ([id number]) Visit Report for 04/15/2017 Arrival Information Details Patient Name: [name], [name]. Date of Service: 04/15/2017 9:15 AM Medical Record Number: [id number] Patient Account Number: [id number] Date of Birth/Sex: 17-May-1941 (76 y.o. Male) Treating RN: Huel Coventry Primary Care Kimyatta Lecy: Windle Guard Other Clinician: Referring Waylynn Benefiel: Windle Guard Treating Merlon Alcorta/Extender: Linwood Dibbles, HOYT Weeks in Treatment: 4 Visit Information History Since Last Visit Added or deleted any medications: No Patient Arrived: Ambulatory Any new allergies or adverse reactions: No Arrival Time: 09:23 Had a fall or experienced change in No Accompanied By: daughter activities of daily living that may affect Transfer Assistance: None risk of falls: Patient Identification Verified: Yes Signs or symptoms of abuse/neglect since last No Secondary Verification Process Yes visito Completed: Hospitalized since last visit: No Patient Has Alerts: Yes Has Dressing in Place as Prescribed: Yes Patient Alerts: Patient on Blood Pain Present Now: No Thinner ELIQUIS Electronic Signature(s) Signed: 04/15/2017 11:10:26 AM By: Elliot Gurney, BSN, RN, CWS, Kim RN, BSN Entered By: Elliot Gurney, BSN, RN, CWS, Kim on 04/15/2017 09:23:50 [name], [name] ([id number]) -------------------------------------------------------------------------------- Clinic Level of Care Assessment Details Patient Name: [name], [name] Date of Service: 04/15/2017 9:15 AM Medical Record Number: [id number] Patient Account Number: [id number] Date of Birth/Sex: 04-08-1941 (76 y.o. Male) Treating RN: Huel Coventry Primary Care Zi Newbury: Windle Guard Other Clinician: Referring Ayodeji Keimig: Windle Guard Treating Denim Start/Extender: Linwood Dibbles, HOYT Weeks in Treatment: 4 Clinic Level of Care Assessment Items TOOL 4 Quantity Score []  - Use when only an EandM is performed on FOLLOW-UP visit 0 ASSESSMENTS - Nursing Assessment /  Reassessment []  - Reassessment of Co-morbidities (includes updates in patient status) 0 X - Reassessment of Adherence to Treatment Plan 1 5 ASSESSMENTS - Wound and Skin Assessment / Reassessment X - Simple Wound Assessment / Reassessment - one wound 1 5 []  - Complex Wound Assessment / Reassessment - multiple wounds 0 []  - Dermatologic / Skin Assessment (not related to wound area) 0 ASSESSMENTS - Focused Assessment []  - Circumferential Edema Measurements - multi extremities 0 []  - Nutritional Assessment / Counseling / Intervention 0 []  - Lower Extremity Assessment (monofilament, tuning fork, pulses) 0 []  - Peripheral Arterial Disease Assessment (using hand held doppler) 0 ASSESSMENTS - Ostomy and/or Continence Assessment and Care []  - Incontinence Assessment and Management 0 []  - Ostomy Care Assessment and Management (repouching, etc.) 0 PROCESS - Coordination of Care X - Simple Patient / Family Education for ongoing care 1 15 []  - Complex (extensive) Patient / Family Education for ongoing care 0 X - Staff obtains Chiropractor, Records, Test Results / Process Orders 1 10 []  - Staff telephones HHA, Nursing Homes / Clarify orders / etc 0 []  - Routine Transfer to another Facility (non-emergent condition) 0 [name], [name] ([id number]) []  - Routine Hospital Admission (non-emergent condition) 0 []  - New Admissions / Manufacturing engineer / Ordering NPWT, Apligraf, etc. 0 []  - Emergency Hospital Admission (emergent condition) 0 X - Simple Discharge Coordination 1 10 []  - Complex (extensive) Discharge Coordination 0 PROCESS - Special Needs []  - Pediatric / Minor Patient Management 0 []  - Isolation Patient Management 0 []  - Hearing / Language / Visual special needs 0 []  - Assessment of Community assistance (transportation, D/C planning, etc.) 0 []  - Additional assistance / Altered mentation 0 []  - Support Surface(s) Assessment (bed, cushion, seat, etc.) 0 INTERVENTIONS - Wound Cleansing /  Measurement X - Simple Wound Cleansing - one wound 1 5 []  - Complex Wound Cleansing - multiple wounds 0 X - Wound  Imaging (photographs - any number of wounds) 1 5 []  - Wound Tracing (instead of photographs) 0 X - Simple Wound Measurement - one wound 1 5 []  - Complex Wound Measurement - multiple wounds 0 INTERVENTIONS - Wound Dressings []  - Small Wound Dressing one or multiple wounds 0 X - Medium Wound Dressing one or multiple wounds 1 15 []  - Large Wound Dressing one or multiple wounds 0 []  - Application of Medications - topical 0 []  - Application of Medications - injection 0 INTERVENTIONS - Miscellaneous []  - External ear exam 0 [name], [name] ([id number]) []  - Specimen Collection (cultures, biopsies, blood, body fluids, etc.) 0 []  - Specimen(s) / Culture(s) sent or taken to Lab for analysis 0 []  - Patient Transfer (multiple staff / Michiel Sites Lift / Similar devices) 0 []  - Simple Staple / Suture removal (25 or less) 0 []  - Complex Staple / Suture removal (26 or more) 0 []  - Hypo / Hyperglycemic Management (close monitor of Blood Glucose) 0 []  - Ankle / Brachial Index (ABI) - do not check if billed separately 0 X - Vital Signs 1 5 Has the patient been seen at the hospital within the last three years: Yes Total Score: 80 Level Of Care: New/Established - Level 3 Electronic Signature(s) Signed: 04/15/2017 11:10:26 AM By: Elliot Gurney, BSN, RN, CWS, Kim RN, BSN Entered By: Elliot Gurney, BSN, RN, CWS, Kim on 04/15/2017 09:45:31 [name], [name] ([id number]) -------------------------------------------------------------------------------- Encounter Discharge Information Details Patient Name: [name], [name] Date of Service: 04/15/2017 9:15 AM Medical Record Number: [id number] Patient Account Number: [id number] Date of Birth/Sex: [birth year] (76 y.o. Male) Treating RN: Huel Coventry Primary Care Daxx Tiggs: Windle Guard Other Clinician: Referring Clairessa Boulet: Windle Guard Treating Saatvik Thielman/Extender: Linwood Dibbles,  HOYT Weeks in Treatment: 4 Encounter Discharge Information Items Discharge Pain Level: 0 Discharge Condition: Stable Ambulatory Status: Ambulatory Discharge Destination: Home Transportation: Private Auto Accompanied By: daughter Schedule Follow-up Appointment: Yes Medication Reconciliation completed and provided to Patient/Care Yes Lanier Millon: Provided on Clinical Summary of Care: 04/15/2017 Form Type Recipient Paper Patient [facility] Electronic Signature(s) Signed: 04/15/2017 11:10:26 AM By: Elliot Gurney, BSN, RN, CWS, Kim RN, BSN Entered By: Elliot Gurney, BSN, RN, CWS, Kim on 04/15/2017 09:46:38 [name], [name] ([id number]) -------------------------------------------------------------------------------- Lower Extremity Assessment Details Patient Name: Leandro, Kelley L. Date of Service: 04/15/2017 9:15 AM Medical Record Number: [id number] Patient Account Number: [id number] Date of Birth/Sex: 1940/10/28 (76 y.o. Male) Treating RN: Huel Coventry Primary Care Robinette Esters: Windle Guard Other Clinician: Referring Crespin Forstrom: Windle Guard Treating Addisynn Vassell/Extender: Linwood Dibbles, HOYT Weeks in Treatment: 4 Vascular Assessment Pulses: Dorsalis Pedis Palpable: [Left:Yes] Posterior Tibial Extremity colors, hair growth, and conditions: Extremity Color: [Left:Normal] Hair Growth on Extremity: [Left:No] Temperature of Extremity: [Left:Warm] Capillary Refill: [Left:< 3 seconds] Dependent Rubor: [Left:No] Blanched when Elevated: [Left:No] Lipodermatosclerosis: [Left:No] Toe Nail Assessment Left: Right: Thick: Yes Discolored: No Deformed: No Improper Length and Hygiene: No Electronic Signature(s) Signed: 04/15/2017 11:10:26 AM By: Elliot Gurney, BSN, RN, CWS, Kim RN, BSN Entered By: Elliot Gurney, BSN, RN, CWS, Kim on 04/15/2017 09:32:51 [name], [name] ([id number]) -------------------------------------------------------------------------------- Multi Wound Chart Details Patient Name: Schulke, Sneijder L. Date of Service: 04/15/2017  9:15 AM Medical Record Number: [id number] Patient Account Number: [id number] Date of Birth/Sex: 1941/01/29 (76 y.o. Male) Treating RN: Huel Coventry Primary Care Gibson Lad: Windle Guard Other Clinician: Referring Miyu Fenderson: Windle Guard Treating Temara Lanum/Extender: STONE III, HOYT Weeks in Treatment: 4 Vital Signs Height(in): 72 Pulse(bpm): 69 Weight(lbs): 226 Blood Pressure 106/65 (mmHg): Body Mass Index(BMI): 31 Temperature(F): 97.9 Respiratory Rate 16 (breaths/min): Photos: [N/A:N/A] Wound Location: Left  Lower Leg - Lateral Left Lower Leg - Anterior N/A Wounding Event: Gradually Appeared Shear/Friction N/A Primary Etiology: Trauma, Other Venous Leg Ulcer N/A Comorbid History: Cataracts, Arrhythmia, Cataracts, Arrhythmia, N/A Hypertension, Peripheral Hypertension, Peripheral Arterial Disease, Arterial Disease, Peripheral Venous Peripheral Venous Disease, Gout, Disease, Gout, Osteoarthritis Osteoarthritis Date Acquired: 10/13/2016 04/05/2017 N/A Weeks of Treatment: 4 1 N/A Wound Status: Open Healed - Epithelialized N/A Measurements L x W x D 0.9x0.8x0.1 0x0x0 N/A (cm) Area (cm) : 0.565 0 N/A Volume (cm) : 0.057 0 N/A % Reduction in Area: 88.50% 100.00% N/A % Reduction in Volume: 94.20% 100.00% N/A Classification: Full Thickness Without Partial Thickness N/A Exposed Support Structures Exudate Amount: Medium N/A N/A Exudate Type: Serosanguineous N/A N/A Exudate Color: red, brown N/A N/A Wound Margin: Flat and Intact N/A N/A Diehl, Trampus L. ([id number]) Granulation Amount: Large (67-100%) N/A N/A Granulation Quality: Red N/A N/A Necrotic Amount: None Present (0%) N/A N/A Exposed Structures: Fat Layer (Subcutaneous N/A N/A Tissue) Exposed: Yes Fascia: No Tendon: No Muscle: No Joint: No Bone: No Epithelialization: Small (1-33%) Large (67-100%) N/A Periwound Skin Texture: Induration: Yes No Abnormalities Noted N/A Excoriation: No Callus: No Crepitus: No Rash:  No Scarring: No Periwound Skin Maceration: No No Abnormalities Noted N/A Moisture: Dry/Scaly: No Periwound Skin Color: Ecchymosis: Yes No Abnormalities Noted N/A Atrophie Blanche: No Cyanosis: No Erythema: No Hemosiderin Staining: No Mottled: No Pallor: No Rubor: No Temperature: No Abnormality N/A N/A Tenderness on No No N/A Palpation: Wound Preparation: Ulcer Cleansing: N/A N/A Rinsed/Irrigated with Saline Topical Anesthetic Applied: None Treatment Notes Electronic Signature(s) Signed: 04/15/2017 11:10:26 AM By: Elliot Gurney, BSN, RN, CWS, Kim RN, BSN Entered By: Elliot Gurney, BSN, RN, CWS, Kim on 04/15/2017 09:33:05 [name], [name] ([id number]) -------------------------------------------------------------------------------- Multi-Disciplinary Care Plan Details Patient Name: [name], [name] Date of Service: 04/15/2017 9:15 AM Medical Record Number: [id number] Patient Account Number: [id number] Date of Birth/Sex: Nov 18, 1940 (76 y.o. Male) Treating RN: Huel Coventry Primary Care Dirk Vanaman: Windle Guard Other Clinician: Referring Hallelujah Wysong: Windle Guard Treating Sarahlynn Cisnero/Extender: Linwood Dibbles, HOYT Weeks in Treatment: 4 Active Inactive ` Orientation to the Wound Care Program Nursing Diagnoses: Knowledge deficit related to the wound healing center program Goals: Patient/caregiver will verbalize understanding of the Wound Healing Center Program Date Initiated: 03/18/2017 Target Resolution Date: 07/19/2017 Goal Status: Active Interventions: Provide education on orientation to the wound center Notes: ` Wound/Skin Impairment Nursing Diagnoses: Impaired tissue integrity Knowledge deficit related to ulceration/compromised skin integrity Goals: Patient/caregiver will verbalize understanding of skin care regimen Date Initiated: 03/18/2017 Target Resolution Date: 07/19/2017 Goal Status: Active Ulcer/skin breakdown will have a volume reduction of 30% by week 4 Date Initiated:  03/18/2017 Target Resolution Date: 07/19/2017 Goal Status: Active Ulcer/skin breakdown will have a volume reduction of 50% by week 8 Date Initiated: 03/18/2017 Target Resolution Date: 07/19/2017 Goal Status: Active Ulcer/skin breakdown will have a volume reduction of 80% by week 12 Date Initiated: 03/18/2017 Target Resolution Date: 07/19/2017 Goal Status: Active Ulcer/skin breakdown will heal within 14 weeks [name], [name] ([id number]) Date Initiated: 03/18/2017 Target Resolution Date: 07/19/2017 Goal Status: Active Interventions: Assess patient/caregiver ability to perform ulcer/skin care regimen upon admission and as needed Treatment Activities: Topical wound management initiated : 03/18/2017 Notes: Electronic Signature(s) Signed: 04/15/2017 11:10:26 AM By: Elliot Gurney, BSN, RN, CWS, Kim RN, BSN Entered By: Elliot Gurney, BSN, RN, CWS, Kim on 04/15/2017 09:32:56 Chea, [name] ([id number]) -------------------------------------------------------------------------------- Pain Assessment Details Patient Name: [name], [name] Date of Service: 04/15/2017 9:15 AM Medical Record Number: [id number] Patient Account Number: [id number] Date of Birth/Sex: 09/13/1940 (  76 y.o. Male) Treating RN: Huel CoventryWoody, Kim Primary Care Arnie Clingenpeel: Windle GuardELKINS, WILSON Other Clinician: Referring Shann Merrick: Windle GuardELKINS, WILSON Treating Orbie Grupe/Extender: Linwood DibblesSTONE III, HOYT Weeks in Treatment: 4 Active Problems Location of Pain Severity and Description of Pain Patient Has Paino No Site Locations With Dressing Change: No Pain Management and Medication Current Pain Management: Goals for Pain Management Topical or injectable lidocaine is offered to patient for acute pain when surgical debridement is performed. If needed, Patient is instructed to use over the counter pain medication for the following 24-48 hours after debridement. Wound care MDs do not prescribed pain medications. Patient has chronic pain or uncontrolled pain. Patient has  been instructed to make an appointment with their Primary Care Physician for pain management. Electronic Signature(s) Signed: 04/15/2017 11:10:26 AM By: Elliot GurneyWoody, BSN, RN, CWS, Kim RN, BSN Entered By: Elliot GurneyWoody, BSN, RN, CWS, Kim on 04/15/2017 09:24:09 [name], James LangoICHARD L. ([id number]) -------------------------------------------------------------------------------- Patient/Caregiver Education Details Patient Name: Leyva, Oran L. Date of Service: 04/15/2017 9:15 AM Medical Record Number: [id number] Patient Account Number: [id number] Date of Birth/Gender: 22-Jan-1941 [age] y.o. Male) Treating RN: Huel CoventryWoody, Kim Primary Care Physician: Windle GuardELKINS, WILSON Other Clinician: Referring Physician: Windle GuardELKINS, WILSON Treating Physician/Extender: Skeet SimmerSTONE III, HOYT Weeks in Treatment: 4 Education Assessment Education Provided To: Patient Education Topics Provided Wound/Skin Impairment: Handouts: Caring for Your Ulcer, Other: wound care as prescribed Methods: Demonstration Responses: State content correctly Electronic Signature(s) Signed: 04/15/2017 11:10:26 AM By: Elliot GurneyWoody, BSN, RN, CWS, Kim RN, BSN Entered By: Elliot GurneyWoody, BSN, RN, CWS, Kim on 04/15/2017 09:46:57 Geisler, James LangoICHARD L. ([id number]) -------------------------------------------------------------------------------- Wound Assessment Details Patient Name: Sustaita, Criag L. Date of Service: 04/15/2017 9:15 AM Medical Record Number: [id number] Patient Account Number: [id number] Date of Birth/Sex: 22-Jan-1941 [age] y.o. Male) Treating RN: Huel CoventryWoody, Kim Primary Care Sherma Vanmetre: Windle GuardELKINS, WILSON Other Clinician: Referring Sway Guttierrez: Windle GuardELKINS, WILSON Treating Chirsty Armistead/Extender: Linwood DibblesSTONE III, HOYT Weeks in Treatment: 4 Wound Status Wound Number: 1 Primary Trauma, Other Etiology: Wound Location: Left Lower Leg - Lateral Wound Open Wounding Event: Gradually Appeared Status: Date Acquired: 10/13/2016 Comorbid Cataracts, Arrhythmia, Hypertension, Weeks Of Treatment: 4 History: Peripheral Arterial  Disease, Peripheral Clustered Wound: No Venous Disease, Gout, Osteoarthritis Photos Wound Measurements Length: (cm) 0.9 Width: (cm) 0.8 Depth: (cm) 0.1 Area: (cm) 0.565 Volume: (cm) 0.057 % Reduction in Area: 88.5% % Reduction in Volume: 94.2% Epithelialization: Small (1-33%) Tunneling: No Undermining: No Wound Description Full Thickness Without Exposed Foul Odor After Classification: Support Structures Slough/Fibrino Wound Margin: Flat and Intact Exudate Medium Amount: Exudate Type: Serosanguineous Exudate Color: red, brown Cleansing: No No Wound Bed Granulation Amount: Large (67-100%) Exposed Structure Granulation Quality: Red Fascia Exposed: No Necrotic Amount: None Present (0%) Fat Layer (Subcutaneous Tissue) Exposed: Yes Tendon Exposed: No Muscle Exposed: No Niblett, Dwayn L. ([id number]) Joint Exposed: No Bone Exposed: No Periwound Skin Texture Texture Color No Abnormalities Noted: No No Abnormalities Noted: No Callus: No Atrophie Blanche: No Crepitus: No Cyanosis: No Excoriation: No Ecchymosis: Yes Induration: Yes Erythema: No Rash: No Hemosiderin Staining: No Scarring: No Mottled: No Pallor: No Moisture Rubor: No No Abnormalities Noted: No Dry / Scaly: No Temperature / Pain Maceration: No Temperature: No Abnormality Wound Preparation Ulcer Cleansing: Rinsed/Irrigated with Saline Topical Anesthetic Applied: None Treatment Notes Wound #1 (Left, Lateral Lower Leg) 1. Cleansed with: Clean wound with Normal Saline 2. Anesthetic Topical Lidocaine 4% cream to wound bed prior to debridement 4. Dressing Applied: Hydrafera Blue 5. Secondary Dressing Applied ABD and Kerlix/Conform 7. Secured with Secretary/administratorTape Electronic Signature(s) Signed: 04/15/2017 11:10:26 AM By: Elliot GurneyWoody, BSN, RN, CWS, Kim RN, BSN Entered  By: Elliot Gurney, BSN, RN, CWS, Kim on 04/15/2017 09:30:15 Whisenant, [name]  ([id number]) -------------------------------------------------------------------------------- Wound Assessment Details Patient Name: Baiz, Fedrick L. Date of Service: 04/15/2017 9:15 AM Medical Record Number: [id number] Patient Account Number: [id number] Date of Birth/Sex: [birth year] (76 y.o. Male) Treating RN: Huel Coventry Primary Care Savana Spina: Windle Guard Other Clinician: Referring Norm Wray: Windle Guard Treating Xaiver Roskelley/Extender: Linwood Dibbles, HOYT Weeks in Treatment: 4 Wound Status Wound Number: 2 Primary Venous Leg Ulcer Etiology: Wound Location: Left Lower Leg - Anterior Wound Healed - Epithelialized Wounding Event: Shear/Friction Status: Date Acquired: 04/05/2017 Comorbid Cataracts, Arrhythmia, Hypertension, Weeks Of Treatment: 1 History: Peripheral Arterial Disease, Peripheral Clustered Wound: No Venous Disease, Gout, Osteoarthritis Photos Wound Measurements Length: (cm) 0 % Reduction Width: (cm) 0 % Reduction Depth: (cm) 0 Epithelializ Area: (cm) 0 Tunneling: Volume: (cm) 0 Undermining in Area: 100% in Volume: 100% ation: Large (67-100%) No : No Wound Description Classification: Partial Thickness Periwound Skin Texture Texture Color No Abnormalities Noted: No No Abnormalities Noted: No Moisture No Abnormalities Noted: No Electronic Signature(s) Signed: 04/15/2017 11:10:26 AM By: Elliot Gurney, BSN, RN, CWS, Kim RN, BSN Entered By: Elliot Gurney, BSN, RN, CWS, Kim on 04/15/2017 09:32:08 Lumbra, [name] ([id number]) -------------------------------------------------------------------------------- Vitals Details Patient Name: Deckman, Teven L. Date of Service: 04/15/2017 9:15 AM Medical Record Number: [id number] Patient Account Number: [id number] Date of Birth/Sex: 14-Oct-1940 (76 y.o. Male) Treating RN: Huel Coventry Primary Care Cornelious Bartolucci: Windle Guard Other Clinician: Referring Danashia Landers: Windle Guard Treating Yoceline Bazar/Extender: Linwood Dibbles, HOYT Weeks in Treatment:  4 Vital Signs Time Taken: 09:24 Temperature (F): 97.9 Height (in): 72 Pulse (bpm): 69 Weight (lbs): 226 Respiratory Rate (breaths/min): 16 Body Mass Index (BMI): 30.6 Blood Pressure (mmHg): 106/65 Reference Range: 80 - 120 mg / dl Electronic Signature(s) Signed: 04/15/2017 11:10:26 AM By: Elliot Gurney, BSN, RN, CWS, Kim RN, BSN Entered By: Elliot Gurney, BSN, RN, CWS, Kim on 04/15/2017 [id number]

## 2017-04-19 ENCOUNTER — Encounter: Payer: Self-pay | Admitting: *Deleted

## 2017-04-22 ENCOUNTER — Encounter: Payer: Medicare Other | Admitting: Physician Assistant

## 2017-04-22 DIAGNOSIS — L97822 Non-pressure chronic ulcer of other part of left lower leg with fat layer exposed: Secondary | ICD-10-CM | POA: Diagnosis not present

## 2017-04-23 NOTE — Progress Notes (Signed)
MICHAELL, GRIDER ([id number]) Visit Report for 04/22/2017 Chief Complaint Document Details Patient Name: [name], [name] Date of Service: 04/22/2017 9:00 AM Medical Record Number: [id number] Patient Account Number: [id number] Date of Birth/Sex: 1940-12-19 (76 y.o. Male) Treating RN: Clover Mealy, RN, BSN, [place] Sink Primary Care Provider: Windle Guard Other Clinician: Referring Provider: Windle Guard Treating Provider/Extender: Linwood Dibbles, Mirissa Lopresti Weeks in Treatment: 5 Information Obtained from: Patient Chief Complaint Left LE Ulcer Electronic Signature(s) Signed: 04/22/2017 12:41:32 PM By: Lenda Kelp PA-C Entered By: Lenda Kelp on 04/22/2017 09:13:50 Szafranski, [name] ([id number]) -------------------------------------------------------------------------------- Debridement Details Patient Name: James Deed L. Date of Service: 04/22/2017 9:00 AM Medical Record Number: [id number] Patient Account Number: [id number] Date of Birth/Sex: 05-04-1941 (76 y.o. Male) Treating RN: Phillis Haggis Primary Care Provider: Windle Guard Other Clinician: Referring Provider: Windle Guard Treating Provider/Extender: Linwood Dibbles, Briell Paulette Weeks in Treatment: 5 Debridement Performed for Wound #1 Left,Lateral Lower Leg Assessment: Performed By: Physician STONE III, Quinnetta Roepke E., PA-C Debridement: Debridement Pre-procedure Verification/Time Out Yes - 09:15 Taken: Start Time: 09:15 Pain Control: Other : lidocaine 4% Level: Skin/Subcutaneous Tissue Total Area Debrided (L x 1 (cm) x 0.6 (cm) = 0.6 (cm) W): Tissue and other Viable, Non-Viable, Fibrin/Slough, Skin, Subcutaneous material debrided: Instrument: Scissors Bleeding: Minimum Hemostasis Achieved: Pressure End Time: 09:18 Procedural Pain: 0 Post Procedural Pain: 0 Response to Treatment: Procedure was tolerated well Post Debridement Measurements of Total Wound Length: (cm) 1 Width: (cm) 0.6 Depth: (cm) 0.1 Volume: (cm) 0.047 Character of  Wound/Ulcer Post Improved Debridement: Post Procedure Diagnosis Same as Pre-procedure Electronic Signature(s) Signed: 04/22/2017 12:41:32 PM By: Lenda Kelp PA-C Signed: 04/22/2017 2:40:18 PM By: Alejandro Mulling Entered By: Alejandro Mulling on 04/22/2017 09:18:35 Molzahn, [name] ([id number]) -------------------------------------------------------------------------------- HPI Details Patient Name: [name], [name] Date of Service: 04/22/2017 9:00 AM Medical Record Number: [id number] Patient Account Number: [id number] Date of Birth/Sex: Jul 13, 1941 (76 y.o. Male) Treating RN: Clover Mealy, RN, BSN, [place] Sink Primary Care Provider: Windle Guard Other Clinician: Referring Provider: Windle Guard Treating Provider/Extender: Linwood Dibbles, Ople Girgis Weeks in Treatment: 5 History of Present Illness HPI Description: 03/18/17 on evaluation today patient presents for initial visit concerning an ulcer he has on the left lateral lower extremity which has been present since mid February 2018. Unfortunately due to other family circumstances evaluation and treatment of this wound has been put on hold until this point when they could finally get him into be seen. He does have high blood pressure but has no history of diabetes though he does tell me that his cardiologist had been monitoring him for hyperglycemia and he is "borderline". He does have some discomfort in regard to this wound but fortunately this is not severe and typically with cleansing. His main concern is why this wound is not healing as in the past he has never had any difficulty with healing. The initial injury was during the period of time where he was helping a friend with some work and scrape the leg on a brick during that process. It has just never healed since that time. Patient is on chronic anticoagulant therapy and has not had any cardiovascular procedures such as arterial or venous imaging. 03/25/2017 - I'm seeing the patient for the first  time today and notes that he has had a injury to the left lower extremity since mid February and from what I understand a hematoma opened out into a lacerated wound and this has been slow to heal. He has had no history of previous arterial or venous problems and  has had some cardiac history with stents placed. He is not a diabetic and not a smoker. During his previous visit there has been a arterial and venous duplex study ordered and this is pending 04/01/2017 -- arterial and venous duplex studies still pending 04/08/2017 -- lower extremity venous duplex reflux evaluation done on 04/02/2017 showed no evidence of venous incompetence in both left and right great saphenous veins. lower extremity arterial duplex evaluation done on 04/06/2017 -- no evidence of hemodynamically significant arterial occlusive disease bilaterally. the right ABI was 1.19 with a toe pressure of 0.83 and the left ABI was 1.25 with a toe pressures of 0.78 and triphasic flow. these were all normal. 04/15/17 on evaluation today patient appears to be doing fairly well in regard to his right lower extremity wound. He has been tolerating the dressing without complication. His biggest thing is that he states he was "hoping that today would be the last visit and he will not have to come back". Nonetheless he still has an open wound noted and I explained to him that we do want to see him back to obviously work toward getting this to close appropriately. Fortunately we did have the results as noted above of his arterial duplex study and venous studies which appear to be doing very well. Overall I do believe he is progressing nicely. No fevers, chills, nausea, or vomiting noted at this time. 04/22/17 on evaluation today patient's left lower from the wound appears to be doing about the same as last week's evaluation. He also has a superficial skin tear in the lateral periwound that I believe is due to the current dressing that is the  [facility] Dressing actually sticking to his skin. I think this could be potentially damaging the wound bed as well. With that being said he is not having any significant pain. No fevers, chills, nausea, or vomiting noted at this time. Electronic Signature(s) SHASTA, CAPSTICK ([id number]) Signed: 04/22/2017 12:41:32 PM By: Lenda Kelp PA-C Entered By: Lenda Kelp on 04/22/2017 09:22:21 Teare, [name] ([id number]) -------------------------------------------------------------------------------- Physical Exam Details Patient Name: Dornfeld, Mickal L. Date of Service: 04/22/2017 9:00 AM Medical Record Number: [id number] Patient Account Number: [id number] Date of Birth/Sex: 12-May-1941 (76 y.o. Male) Treating RN: Clover Mealy, RN, BSN, [employer] Sink Primary Care Provider: Windle Guard Other Clinician: Referring Provider: Windle Guard Treating Provider/Extender: STONE III, Vara Mairena Weeks in Treatment: 5 Constitutional Well-nourished and well-hydrated in no acute distress. Respiratory normal breathing without difficulty. Cardiovascular no clubbing, cyanosis, significant edema, <3 sec cap refill. Psychiatric this patient is able to make decisions and demonstrates good insight into disease process. Alert and Oriented x 3. pleasant and cooperative. Notes Patient's wound has no periwound erythema noted on evaluation today. There was however tissue which required debridement patient tolerated this without complication today. Fortunately he does not seem to have some swelling of the lower extremities. Electronic Signature(s) Signed: 04/22/2017 12:41:32 PM By: Lenda Kelp PA-C Entered By: Lenda Kelp on 04/22/2017 09:23:04 Feagans, [name] ([id number]) -------------------------------------------------------------------------------- Physician Orders Details Patient Name: Ottaway, Roi L. Date of Service: 04/22/2017 9:00 AM Medical Record Number: [id number] Patient Account Number: [id number] Date  of Birth/Sex: [birth year] (76 y.o. Male) Treating RN: Huel Coventry Primary Care Provider: Windle Guard Other Clinician: Referring Provider: Windle Guard Treating Provider/Extender: Linwood Dibbles, Meric Joye Weeks in Treatment: 5 Verbal / Phone Orders: No Diagnosis Coding ICD-10 Coding Code Description [phone] Non-pressure chronic ulcer of other part of left lower leg with fat layer exposed I10 Essential (primary)  hypertension Wound Cleansing Wound #1 Left,Lateral Lower Leg o Cleanse wound with mild soap and water o May shower with protection. Anesthetic Wound #1 Left,Lateral Lower Leg o Topical Lidocaine 4% cream applied to wound bed prior to debridement Skin Barriers/Peri-Wound Care Wound #1 Left,Lateral Lower Leg o Barrier cream o Moisturizing lotion Primary Wound Dressing Wound #1 Left,Lateral Lower Leg o Prisma Ag Secondary Dressing Wound #1 Left,Lateral Lower Leg o Gauze and Kerlix/Conform Dressing Change Frequency Wound #1 Left,Lateral Lower Leg o Change dressing every other day. Follow-up Appointments Wound #1 Left,Lateral Lower Leg o Return Appointment in 1 week. Coolman, ZAEVION PARKE ([id number]) Edema Control Wound #1 Left,Lateral Lower Leg o Elevate legs to the level of the heart and pump ankles as often as possible Additional Orders / Instructions Wound #1 Left,Lateral Lower Leg o Increase protein intake. o Activity as tolerated Electronic Signature(s) Signed: 04/22/2017 12:41:32 PM By: Lenda Kelp PA-C Signed: 04/22/2017 1:20:34 PM By: Elliot Gurney, BSN, RN, CWS, Kim RN, BSN Entered By: Elliot Gurney, BSN, RN, CWS, Kim on 04/22/2017 09:19:45 Anstead, [name] ([id number]) -------------------------------------------------------------------------------- Problem List Details Patient Name: Avera, Kathan L. Date of Service: 04/22/2017 9:00 AM Medical Record Number: [id number] Patient Account Number: [id number] Date of Birth/Sex: September 07, 1941 (76 y.o. Male) Treating  RN: Clover Mealy, RN, BSN, [place] Sink Primary Care Provider: Windle Guard Other Clinician: Referring Provider: Windle Guard Treating Provider/Extender: Linwood Dibbles, Kenyon Eichelberger Weeks in Treatment: 5 Active Problems ICD-10 Encounter Code Description Active Date Diagnosis [phone] Non-pressure chronic ulcer of other part of left lower leg 03/18/2017 Yes with fat layer exposed I10 Essential (primary) hypertension 03/18/2017 Yes Inactive Problems Resolved Problems ICD-10 Code Description Active Date Resolved Date I87.2 Venous insufficiency (chronic) (peripheral) 03/18/2017 03/18/2017 Electronic Signature(s) Signed: 04/22/2017 12:41:32 PM By: Lenda Kelp PA-C Entered By: Lenda Kelp on 04/22/2017 09:13:19 Ong, [name] ([id number]) -------------------------------------------------------------------------------- Progress Note Details Patient Name: Pittman, Ayomikun L. Date of Service: 04/22/2017 9:00 AM Medical Record Number: [id number] Patient Account Number: [id number] Date of Birth/Sex: 01-30-1941 (76 y.o. Male) Treating RN: Clover Mealy, RN, BSN, [place] Sink Primary Care Provider: Windle Guard Other Clinician: Referring Provider: Windle Guard Treating Provider/Extender: Linwood Dibbles, Lewis Keats Weeks in Treatment: 5 Subjective Chief Complaint Information obtained from Patient Left LE Ulcer History of Present Illness (HPI) 03/18/17 on evaluation today patient presents for initial visit concerning an ulcer he has on the left lateral lower extremity which has been present since mid February 2018. Unfortunately due to other family circumstances evaluation and treatment of this wound has been put on hold until this point when they could finally get him into be seen. He does have high blood pressure but has no history of diabetes though he does tell me that his cardiologist had been monitoring him for hyperglycemia and he is "borderline". He does have some discomfort in regard to this wound but fortunately this is not  severe and typically with cleansing. His main concern is why this wound is not healing as in the past he has never had any difficulty with healing. The initial injury was during the period of time where he was helping a friend with some work and scrape the leg on a brick during that process. It has just never healed since that time. Patient is on chronic anticoagulant therapy and has not had any cardiovascular procedures such as arterial or venous imaging. 03/25/2017 - I'm seeing the patient for the first time today and notes that he has had a injury to the left lower extremity since mid February and from what I  understand a hematoma opened out into a lacerated wound and this has been slow to heal. He has had no history of previous arterial or venous problems and has had some cardiac history with stents placed. He is not a diabetic and not a smoker. During his previous visit there has been a arterial and venous duplex study ordered and this is pending 04/01/2017 -- arterial and venous duplex studies still pending 04/08/2017 -- lower extremity venous duplex reflux evaluation done on 04/02/2017 showed no evidence of venous incompetence in both left and right great saphenous veins. lower extremity arterial duplex evaluation done on 04/06/2017 -- no evidence of hemodynamically significant arterial occlusive disease bilaterally. the right ABI was 1.19 with a toe pressure of 0.83 and the left ABI was 1.25 with a toe pressures of 0.78 and triphasic flow. these were all normal. 04/15/17 on evaluation today patient appears to be doing fairly well in regard to his right lower extremity wound. He has been tolerating the dressing without complication. His biggest thing is that he states he was "hoping that today would be the last visit and he will not have to come back". Nonetheless he still has an open wound noted and I explained to him that we do want to see him back to obviously work toward getting this to  close appropriately. Fortunately we did have the results as noted above of his arterial duplex study and venous studies which appear to be doing very well. Overall I do believe he is progressing nicely. No fevers, chills, nausea, or vomiting noted at this time. 04/22/17 on evaluation today patient's left lower from the wound appears to be doing about the same as last week's evaluation. He also has a superficial skin tear in the lateral periwound that I believe is due to the Lipa, Maxon L. ([id number]) current dressing that is the [facility] Dressing actually sticking to his skin. I think this could be potentially damaging the wound bed as well. With that being said he is not having any significant pain. No fevers, chills, nausea, or vomiting noted at this time. Objective Constitutional Well-nourished and well-hydrated in no acute distress. Vitals Time Taken: 8:57 AM, Height: 72 in, Weight: 226 lbs, BMI: 30.6, Temperature: 97.8 F, Pulse: 67 bpm, Respiratory Rate: 16 breaths/min, Blood Pressure: 104/63 mmHg. Respiratory normal breathing without difficulty. Cardiovascular no clubbing, cyanosis, significant edema, Psychiatric this patient is able to make decisions and demonstrates good insight into disease process. Alert and Oriented x 3. pleasant and cooperative. General Notes: Patient's wound has no periwound erythema noted on evaluation today. There was however tissue which required debridement patient tolerated this without complication today. Fortunately he does not seem to have some swelling of the lower extremities. Integumentary (Hair, Skin) Wound #1 status is Open. Original cause of wound was Gradually Appeared. The wound is located on the Left,Lateral Lower Leg. The wound measures 1cm length x 0.6cm width x 0.1cm depth; 0.471cm^2 area and 0.047cm^3 volume. There is Fat Layer (Subcutaneous Tissue) Exposed exposed. There is no tunneling or undermining noted. There is a large  amount of serosanguineous drainage noted. The wound margin is flat and intact. There is large (67-100%) red granulation within the wound bed. There is no necrotic tissue within the wound bed. The periwound skin appearance exhibited: Induration, Ecchymosis. The periwound skin appearance did not exhibit: Callus, Crepitus, Excoriation, Rash, Scarring, Dry/Scaly, Maceration, Atrophie Blanche, Cyanosis, Hemosiderin Staining, Mottled, Pallor, Rubor, Erythema. Periwound temperature was noted as No Abnormality. Mauney, RONDA KAZMI ([id number]) Assessment  Active Problems ICD-10 [phone] - Non-pressure chronic ulcer of other part of left lower leg with fat layer exposed I10 - Essential (primary) hypertension Procedures Wound #1 Pre-procedure diagnosis of Wound #1 is a Trauma, Other located on the Left,Lateral Lower Leg . There was a Skin/Subcutaneous Tissue Debridement ([id number]) debridement with total area of 0.6 sq cm performed by STONE III, Judythe Postema E., PA-C. with the following instrument(s): Scissors to remove Viable and Non-Viable tissue/material including Fibrin/Slough, Skin, and Subcutaneous after achieving pain control using Other (lidocaine 4%). A time out was conducted at 09:15, prior to the start of the procedure. A Minimum amount of bleeding was controlled with Pressure. The procedure was tolerated well with a pain level of 0 throughout and a pain level of 0 following the procedure. Post Debridement Measurements: 1cm length x 0.6cm width x 0.1cm depth; 0.047cm^3 volume. Character of Wound/Ulcer Post Debridement is improved. Post procedure Diagnosis Wound #1: Same as Pre-Procedure Plan Wound Cleansing: Wound #1 Left,Lateral Lower Leg: Cleanse wound with mild soap and water May shower with protection. Anesthetic: Wound #1 Left,Lateral Lower Leg: Topical Lidocaine 4% cream applied to wound bed prior to debridement Skin Barriers/Peri-Wound Care: Wound #1 Left,Lateral Lower Leg: Barrier  cream Moisturizing lotion Primary Wound Dressing: Wound #1 Left,Lateral Lower Leg: Prisma Ag Secondary Dressing: Bernier, Dyllon L. ([id number]) Wound #1 Left,Lateral Lower Leg: Gauze and Kerlix/Conform Dressing Change Frequency: Wound #1 Left,Lateral Lower Leg: Change dressing every other day. Follow-up Appointments: Wound #1 Left,Lateral Lower Leg: Return Appointment in 1 week. Edema Control: Wound #1 Left,Lateral Lower Leg: Elevate legs to the level of the heart and pump ankles as often as possible Additional Orders / Instructions: Wound #1 Left,Lateral Lower Leg: Increase protein intake. Activity as tolerated I'm going to recommend that we switch him to a silver collagen dressing to be used over the next week to see if this will be better. Hopefully this will not cause as much sticking to the wound bed and damage as I feel the hijacker has been doing. We will suddenly see were things stand in one weeks time. If anything worsens in the interim patient or his family will contact our office for additional recommendations. Electronic Signature(s) Signed: 04/22/2017 12:41:32 PM By: Lenda Kelp PA-C Entered By: Lenda Kelp on 04/22/2017 09:24:05 Caponigro, [name] ([id number]) -------------------------------------------------------------------------------- SuperBill Details Patient Name: James Deed L. Date of Service: 04/22/2017 Medical Record Number: [id number] Patient Account Number: [id number] Date of Birth/Sex: 23-Nov-1940 (76 y.o. Male) Treating RN: Clover Mealy, RN, BSN, [place] Sink Primary Care Provider: Windle Guard Other Clinician: Referring Provider: Windle Guard Treating Provider/Extender: Linwood Dibbles, Massai Hankerson Weeks in Treatment: 5 Diagnosis Coding ICD-10 Codes Code Description [phone] Non-pressure chronic ulcer of other part of left lower leg with fat layer exposed I10 Essential (primary) hypertension Facility Procedures CPT4: Description Modifier Quantity Code [id number]  11042 - DEB SUBQ TISSUE 20 SQ CM/< 1 ICD-10 Description Diagnosis L97.822 Non-pressure chronic ulcer of other part of left lower leg with fat layer exposed I10 Essential (primary) hypertension Physician Procedures CPT4: Description Modifier Quantity Code [id number] 11042 - WC PHYS SUBQ TISS 20 SQ CM 1 ICD-10 Description Diagnosis L97.822 Non-pressure chronic ulcer of other part of left lower leg with fat layer exposed I10 Essential (primary) hypertension Electronic Signature(s) Signed: 04/22/2017 12:41:32 PM By: Lenda Kelp PA-C Entered By: Lenda Kelp on 04/22/2017 09:24:17

## 2017-04-25 NOTE — Progress Notes (Signed)
[name] ([id number]) Visit Report for 04/22/2017 Arrival Information Details Patient Name: [name], [name]. Date of Service: 04/22/2017 9:00 AM Medical Record Number: [id number] Patient Account Number: [id number] Date of Birth/Sex: [birth year] (76 y.o. Male) Treating RN: Phillis Haggis Primary Care Jayke Caul: Windle Guard Other Clinician: Referring Camelle Henkels: Windle Guard Treating Teonia Yager/Extender: Linwood Dibbles, HOYT Weeks in Treatment: 5 Visit Information History Since Last Visit All ordered tests and consults were completed: No Patient Arrived: Ambulatory Added or deleted any medications: No Arrival Time: 08:54 Any new allergies or adverse reactions: No Accompanied By: son Had a fall or experienced change in No Transfer Assistance: None activities of daily living that may affect Patient Identification Verified: Yes risk of falls: Secondary Verification Process Yes Signs or symptoms of abuse/neglect since last No Completed: visito Patient Requires Transmission- No Hospitalized since last visit: No Based Precautions: Has Dressing in Place as Prescribed: Yes Patient Has Alerts: Yes Pain Present Now: No Patient Alerts: Patient on Blood Thinner ELIQUIS Electronic Signature(s) Signed: 04/22/2017 2:40:18 PM By: Alejandro Mulling Entered By: Alejandro Mulling on 04/22/2017 08:57:07 Figeroa, [name] ([id number]) -------------------------------------------------------------------------------- Encounter Discharge Information Details Patient Name: Heater, [name] Date of Service: 04/22/2017 9:00 AM Medical Record Number: [id number] Patient Account Number: [id number] Date of Birth/Sex: 12-26-1940 (76 y.o. Male) Treating RN: Phillis Haggis Primary Care Jamison Soward: Windle Guard Other Clinician: Referring Duane Earnshaw: Windle Guard Treating Claramae Rigdon/Extender: Linwood Dibbles, HOYT Weeks in Treatment: 5 Encounter Discharge Information Items Discharge Pain Level: 0 Discharge Condition:  Stable Ambulatory Status: Ambulatory Discharge Destination: Home Transportation: Private Auto Accompanied By: son Schedule Follow-up Appointment: Yes Medication Reconciliation completed and provided to Patient/Care No Abbee Cremeens: Provided on Clinical Summary of Care: 04/22/2017 Form Type Recipient Paper Patient [facility] Electronic Signature(s) Signed: 04/23/2017 1:46:33 PM By: Gwenlyn Perking Entered By: Gwenlyn Perking on 04/22/2017 09:29:49 Marro, [name] ([id number]) -------------------------------------------------------------------------------- Lower Extremity Assessment Details Patient Name: Friday, [name] Date of Service: 04/22/2017 9:00 AM Medical Record Number: [id number] Patient Account Number: [id number] Date of Birth/Sex: [birth year] (76 y.o. Male) Treating RN: Phillis Haggis Primary Care Sanjana Folz: Windle Guard Other Clinician: Referring Cash Meadow: Windle Guard Treating Kaylla Cobos/Extender: STONE III, HOYT Weeks in Treatment: 5 Vascular Assessment Pulses: Dorsalis Pedis Palpable: [Left:Yes] Posterior Tibial Extremity colors, hair growth, and conditions: Extremity Color: [Left:Normal] Hair Growth on Extremity: [Left:Yes] Temperature of Extremity: [Left:Warm] Capillary Refill: [Left:< 3 seconds] Toe Nail Assessment Left: Right: Thick: Yes Discolored: No Deformed: No Improper Length and Hygiene: No Electronic Signature(s) Signed: 04/22/2017 2:40:18 PM By: Alejandro Mulling Entered By: Alejandro Mulling on 04/22/2017 09:01:59 Lininger, [name] ([id number]) -------------------------------------------------------------------------------- Multi Wound Chart Details Patient Name: [name], [name] Date of Service: 04/22/2017 9:00 AM Medical Record Number: [id number] Patient Account Number: [id number] Date of Birth/Sex: [birth year] (76 y.o. Male) Treating RN: Phillis Haggis Primary Care Tobin Witucki: Windle Guard Other Clinician: Referring Matricia Begnaud: Windle Guard Treating  Marin Wisner/Extender: STONE III, HOYT Weeks in Treatment: 5 Vital Signs Height(in): 72 Pulse(bpm): 67 Weight(lbs): 226 Blood Pressure 104/63 (mmHg): Body Mass Index(BMI): 31 Temperature(F): 97.8 Respiratory Rate 16 (breaths/min): Photos: [1:No Photos] [N/A:N/A] Wound Location: [1:Left Lower Leg - Lateral] [N/A:N/A] Wounding Event: [1:Gradually Appeared] [N/A:N/A] Primary Etiology: [1:Trauma, Other] [N/A:N/A] Comorbid History: [1:Cataracts, Arrhythmia, Hypertension, Peripheral Arterial Disease, Peripheral Venous Disease, Gout, Osteoarthritis] [N/A:N/A] Date Acquired: [1:10/13/2016] [N/A:N/A] Weeks of Treatment: [1:5] [N/A:N/A] Wound Status: [1:Open] [N/A:N/A] Measurements L x W x D 1x0.6x0.1 [N/A:N/A] (cm) Area (cm) : [1:0.471] [N/A:N/A] Volume (cm) : [1:0.047] [N/A:N/A] % Reduction in Area: [1:90.40%] [N/A:N/A] % Reduction in Volume: 95.20% [N/A:N/A] Classification: [1:Full Thickness Without Exposed  Support Structures] [N/A:N/A] Exudate Amount: [1:Large] [N/A:N/A] Exudate Type: [1:Serosanguineous] [N/A:N/A] Exudate Color: [1:red, brown] [N/A:N/A] Wound Margin: [1:Flat and Intact] [N/A:N/A] Granulation Amount: [1:Large (67-100%)] [N/A:N/A] Granulation Quality: [1:Red] [N/A:N/A] Necrotic Amount: [1:None Present (0%)] [N/A:N/A] Exposed Structures: [N/A:N/A] Fat Layer (Subcutaneous Tissue) Exposed: Yes Fascia: No Tendon: No Muscle: No Joint: No Bone: No Epithelialization: Small (1-33%) N/A N/A Periwound Skin Texture: Induration: Yes N/A N/A Excoriation: No Callus: No Crepitus: No Rash: No Scarring: No Periwound Skin Maceration: No N/A N/A Moisture: Dry/Scaly: No Periwound Skin Color: Ecchymosis: Yes N/A N/A Atrophie Blanche: No Cyanosis: No Erythema: No Hemosiderin Staining: No Mottled: No Pallor: No Rubor: No Temperature: No Abnormality N/A N/A Tenderness on No N/A N/A Palpation: Wound Preparation: Ulcer Cleansing: N/A N/A Rinsed/Irrigated  with Saline Topical Anesthetic Applied: Other: lidocaine 4% Treatment Notes Electronic Signature(s) Signed: 04/22/2017 2:40:18 PM By: Alejandro Mulling Entered By: Alejandro Mulling on 04/22/2017 09:15:21 Jaycox, [name] ([id number]) -------------------------------------------------------------------------------- Multi-Disciplinary Care Plan Details Patient Name: Wieser, Tiger L. Date of Service: 04/22/2017 9:00 AM Medical Record Number: [id number] Patient Account Number: [id number] Date of Birth/Sex: April 03, 1941 (76 y.o. Male) Treating RN: Phillis Haggis Primary Care Khyli Swaim: Windle Guard Other Clinician: Referring Jayse Hodkinson: Windle Guard Treating Celie Desrochers/Extender: Linwood Dibbles, HOYT Weeks in Treatment: 5 Active Inactive ` Orientation to the Wound Care Program Nursing Diagnoses: Knowledge deficit related to the wound healing center program Goals: Patient/caregiver will verbalize understanding of the Wound Healing Center Program Date Initiated: 03/18/2017 Target Resolution Date: 07/19/2017 Goal Status: Active Interventions: Provide education on orientation to the wound center Notes: ` Wound/Skin Impairment Nursing Diagnoses: Impaired tissue integrity Knowledge deficit related to ulceration/compromised skin integrity Goals: Patient/caregiver will verbalize understanding of skin care regimen Date Initiated: 03/18/2017 Target Resolution Date: 07/19/2017 Goal Status: Active Ulcer/skin breakdown will have a volume reduction of 30% by week 4 Date Initiated: 03/18/2017 Target Resolution Date: 07/19/2017 Goal Status: Active Ulcer/skin breakdown will have a volume reduction of 50% by week 8 Date Initiated: 03/18/2017 Target Resolution Date: 07/19/2017 Goal Status: Active Ulcer/skin breakdown will have a volume reduction of 80% by week 12 Date Initiated: 03/18/2017 Target Resolution Date: 07/19/2017 Goal Status: Active Ulcer/skin breakdown will heal within 14 weeks KENTREL, MAYLE ([id number]) Date Initiated: 03/18/2017 Target Resolution Date: 07/19/2017 Goal Status: Active Interventions: Assess patient/caregiver ability to perform ulcer/skin care regimen upon admission and as needed Treatment Activities: Topical wound management initiated : 03/18/2017 Notes: Electronic Signature(s) Signed: 04/22/2017 2:40:18 PM By: Alejandro Mulling Entered By: Alejandro Mulling on 04/22/2017 09:15:15 Tarkowski, [name] ([id number]) -------------------------------------------------------------------------------- Pain Assessment Details Patient Name: Hollars, Mehkai L. Date of Service: 04/22/2017 9:00 AM Medical Record Number: [id number] Patient Account Number: [id number] Date of Birth/Sex: Feb 07, 1941 (76 y.o. Male) Treating RN: Phillis Haggis Primary Care Najat Olazabal: Windle Guard Other Clinician: Referring Canisha Issac: Windle Guard Treating Alroy Portela/Extender: Linwood Dibbles, HOYT Weeks in Treatment: 5 Active Problems Location of Pain Severity and Description of Pain Patient Has Paino No Site Locations With Dressing Change: No Pain Management and Medication Current Pain Management: Electronic Signature(s) Signed: 04/22/2017 2:40:18 PM By: Alejandro Mulling Entered By: Alejandro Mulling on 04/22/2017 08:57:13 Kasa, [name] ([id number]) -------------------------------------------------------------------------------- Patient/Caregiver Education Details Patient Name: [name] Date of Service: 04/22/2017 9:00 AM Medical Record Number: [id number] Patient Account Number: [id number] Date of Birth/Gender: 1941/08/19 (76 y.o. Male) Treating RN: Phillis Haggis Primary Care Physician: Windle Guard Other Clinician: Referring Physician: Windle Guard Treating Physician/Extender: Linwood Dibbles, HOYT Weeks in Treatment: 5 Education Assessment Education Provided To: Patient Education Topics Provided Wound/Skin Impairment: Handouts: Other: change dressing  as ordered Methods:  Demonstration, Explain/Verbal Responses: State content correctly Electronic Signature(s) Signed: 04/22/2017 2:40:18 PM By: Alejandro Mulling Entered By: Alejandro Mulling on 04/22/2017 09:22:45 Hodsdon, [name] ([id number]) -------------------------------------------------------------------------------- Wound Assessment Details Patient Name: Knipfer, Trei L. Date of Service: 04/22/2017 9:00 AM Medical Record Number: [id number] Patient Account Number: [id number] Date of Birth/Sex: 1941-02-03 (76 y.o. Male) Treating RN: Phillis Haggis Primary Care Shankar Silber: Windle Guard Other Clinician: Referring Moishe Schellenberg: Windle Guard Treating Brianah Hopson/Extender: Linwood Dibbles, HOYT Weeks in Treatment: 5 Wound Status Wound Number: 1 Primary Trauma, Other Etiology: Wound Location: Left Lower Leg - Lateral Wound Open Wounding Event: Gradually Appeared Status: Date Acquired: 10/13/2016 Comorbid Cataracts, Arrhythmia, Hypertension, Weeks Of Treatment: 5 History: Peripheral Arterial Disease, Peripheral Clustered Wound: No Venous Disease, Gout, Osteoarthritis Photos Photo Uploaded By: Alejandro Mulling on 04/22/2017 13:54:40 Wound Measurements Length: (cm) 1 Width: (cm) 0.6 Depth: (cm) 0.1 Area: (cm) 0.471 Volume: (cm) 0.047 % Reduction in Area: 90.4% % Reduction in Volume: 95.2% Epithelialization: Small (1-33%) Tunneling: No Undermining: No Wound Description Full Thickness Without Exposed Foul Odor Afte Classification: Support Structures Slough/Fibrino Wound Margin: Flat and Intact Exudate Large Amount: Exudate Type: Serosanguineous Exudate Color: red, brown r Cleansing: No No Wound Bed Granulation Amount: Large (67-100%) Exposed Structure Granulation Quality: Red Fascia Exposed: No Thoma, Olvin L. ([id number]) Necrotic Amount: None Present (0%) Fat Layer (Subcutaneous Tissue) Exposed: Yes Tendon Exposed: No Muscle Exposed: No Joint Exposed: No Bone Exposed: No Periwound Skin  Texture Texture Color No Abnormalities Noted: No No Abnormalities Noted: No Callus: No Atrophie Blanche: No Crepitus: No Cyanosis: No Excoriation: No Ecchymosis: Yes Induration: Yes Erythema: No Rash: No Hemosiderin Staining: No Scarring: No Mottled: No Pallor: No Moisture Rubor: No No Abnormalities Noted: No Dry / Scaly: No Temperature / Pain Maceration: No Temperature: No Abnormality Wound Preparation Ulcer Cleansing: Rinsed/Irrigated with Saline Topical Anesthetic Applied: Other: lidocaine 4%, Treatment Notes Wound #1 (Left, Lateral Lower Leg) 1. Cleansed with: Clean wound with Normal Saline 2. Anesthetic Topical Lidocaine 4% cream to wound bed prior to debridement 4. Dressing Applied: Prisma Ag 5. Secondary Dressing Applied Dry Gauze Kerlix/Conform 7. Secured with Secretary/administrator) Signed: 04/22/2017 2:40:18 PM By: Alejandro Mulling Entered By: Alejandro Mulling on 04/22/2017 09:01:23 Wint, [name] ([id number]) -------------------------------------------------------------------------------- Vitals Details Patient Name: Hopfensperger, Jovann L. Date of Service: 04/22/2017 9:00 AM Medical Record Number: [id number] Patient Account Number: [id number] Date of Birth/Sex: [birth year] (76 y.o. Male) Treating RN: Phillis Haggis Primary Care Sarya Linenberger: Windle Guard Other Clinician: Referring Tauheed Mcfayden: Windle Guard Treating Tarik Teixeira/Extender: STONE III, HOYT Weeks in Treatment: 5 Vital Signs Time Taken: 08:57 Temperature (F): 97.8 Height (in): 72 Pulse (bpm): 67 Weight (lbs): 226 Respiratory Rate (breaths/min): 16 Body Mass Index (BMI): 30.6 Blood Pressure (mmHg): 104/63 Reference Range: 80 - 120 mg / dl Electronic Signature(s) Signed: 04/22/2017 2:40:18 PM By: Alejandro Mulling Entered By: Alejandro Mulling on 04/22/2017 08:57:57

## 2017-04-30 ENCOUNTER — Encounter: Payer: Medicare Other | Admitting: Surgery

## 2017-04-30 DIAGNOSIS — L97822 Non-pressure chronic ulcer of other part of left lower leg with fat layer exposed: Secondary | ICD-10-CM | POA: Diagnosis not present

## 2017-05-02 NOTE — Progress Notes (Signed)
[name] [name] ([id number]) Visit Report for 04/30/2017 Chief Complaint Document Details Patient Name: [name] [name] STENDER. Date of Service: 04/30/2017 8:00 AM Medical Record Number: [id number] Patient Account Number: [id number] Date of Birth/Sex: 1940-11-09 (76 y.o. Male) Treating RN: Phillis Haggis Primary Care Provider: Windle Guard Other Clinician: Referring Provider: Windle Guard Treating Provider/Extender: [name] [name] in Treatment: 6 Information Obtained from: Patient Chief Complaint Left LE Ulcer Electronic Signature(s) Signed: 04/30/2017 11:54:07 AM By: Evlyn Kanner MD, FACS Entered By: Evlyn Kanner on 04/30/2017 08:40:01 [name] [name] [name] [name] ([id number]) -------------------------------------------------------------------------------- HPI Details Patient Name: [name] [name]. Date of Service: 04/30/2017 8:00 AM Medical Record Number: [id number] Patient Account Number: [id number] Date of Birth/Sex: 1940/11/26 (76 y.o. Male) Treating RN: Phillis Haggis Primary Care Provider: Windle Guard Other Clinician: Referring Provider: Windle Guard Treating Provider/Extender: [name] [name] in Treatment: 6 History of Present Illness HPI Description: 03/18/17 on evaluation today patient presents for initial visit concerning an ulcer he has on the left lateral lower extremity which has been present since mid February 2018. Unfortunately due to other family circumstances evaluation and treatment of this wound has been put on hold until this point when they could finally get him into be seen. He does have high blood pressure but has no history of diabetes though he does tell me that his cardiologist had been monitoring him for hyperglycemia and he is "borderline". He does have some discomfort in regard to this wound but fortunately this is not severe and typically with cleansing. His main concern is why this wound is not healing as in the past he has never had any  difficulty with healing. The initial injury was during the period of time where he was helping a friend with some work and scrape the leg on a brick during that process. It has just never healed since that time. Patient is on chronic anticoagulant therapy and has not had any cardiovascular procedures such as arterial or venous imaging. 03/25/2017 - I'm seeing the patient for the first time today and notes that he has had a injury to the left lower extremity since mid February and from what I understand a hematoma opened out into a lacerated wound and this has been slow to heal. He has had no history of previous arterial or venous problems and has had some cardiac history with stents placed. He is not a diabetic and not a smoker. During his previous visit there has been a arterial and venous duplex study ordered and this is pending 04/01/2017 -- arterial and venous duplex studies still pending 04/08/2017 -- lower extremity venous duplex reflux evaluation done on 04/02/2017 showed no evidence of venous incompetence in both left and right great saphenous veins. lower extremity arterial duplex evaluation done on 04/06/2017 -- no evidence of hemodynamically significant arterial occlusive disease bilaterally. the right ABI was 1.19 with a toe pressure of 0.83 and the left ABI was 1.25 with a toe pressures of 0.78 and triphasic flow. these were all normal. 04/15/17 on evaluation today patient appears to be doing fairly well in regard to his right lower extremity wound. He has been tolerating the dressing without complication. His biggest thing is that he states he was "hoping that today would be the last visit and he will not have to come back". Nonetheless he still has an open wound noted and I explained to him that we do want to see him back to obviously work toward getting this to close appropriately. Fortunately we did have the results as  noted above of his arterial duplex study and venous studies  which appear to be doing very well. Overall I do believe he is progressing nicely. No fevers, chills, nausea, or vomiting noted at this time. 04/22/17 on evaluation today patient's left lower from the wound appears to be doing about the same as last week's evaluation. He also has a superficial skin tear in the lateral periwound that I believe is due to the current dressing that is the [facility] Dressing actually sticking to his skin. I think this could be potentially damaging the wound bed as well. With that being said he is not having any significant pain. No fevers, chills, nausea, or vomiting noted at this time. Electronic Signature(s) [name] [name] ([id number]) Signed: 04/30/2017 11:54:07 AM By: Evlyn Kanner MD, FACS Entered By: Evlyn Kanner on 04/30/2017 08:40:07 [name] [name] [name] [name] ([id number]) -------------------------------------------------------------------------------- Physical Exam Details Patient Name: [name] [name]. Date of Service: 04/30/2017 8:00 AM Medical Record Number: [id number] Patient Account Number: [id number] Date of Birth/Sex: 09-Jan-1941 (76 y.o. Male) Treating RN: Phillis Haggis Primary Care Provider: Windle Guard Other Clinician: Referring Provider: Windle Guard Treating Provider/Extender: [name] [name] in Treatment: 6 Constitutional . Pulse regular. Respirations normal and unlabored. Afebrile. . Eyes Nonicteric. Reactive to light. Ears, Nose, Mouth, and Throat Lips, teeth, and gums WNL.[name] Moist mucosa without lesions. Neck supple and nontender. No palpable supraclavicular or cervical adenopathy. Normal sized without goiter. Respiratory WNL. No retractions.. Cardiovascular Pedal Pulses WNL. No clubbing, cyanosis or edema. Lymphatic No adneopathy. No adenopathy. No adenopathy. Musculoskeletal Adexa without tenderness or enlargement.. Digits and nails w/o clubbing, cyanosis, infection, petechiae, ischemia, or inflammatory  conditions.. Integumentary (Hair, Skin) No suspicious lesions. No crepitus or fluctuance. No peri-wound warmth or erythema. No masses.[name] Psychiatric Judgement and insight Intact.. No evidence of depression, anxiety, or agitation.. Notes the patient's wound has some superficial eschar covering it and once this was gently removed with moist saline gauze there is a small 3 mm area which is still open but the supple scar tissue is looking good Electronic Signature(s) Signed: 04/30/2017 11:54:07 AM By: Evlyn Kanner MD, FACS Entered By: Evlyn Kanner on 04/30/2017 08:40:40 [name] [name] [name] [name] ([id number]) -------------------------------------------------------------------------------- Physician Orders Details Patient Name: [name] [name] [name] [name]. Date of Service: 04/30/2017 8:00 AM Medical Record Number: [id number] Patient Account Number: [id number] Date of Birth/Sex: 23-Oct-1940 (76 y.o. Male) Treating RN: Phillis Haggis Primary Care Provider: Windle Guard Other Clinician: Referring Provider: Windle Guard Treating Provider/Extender: [name] [name] in Treatment: 6 Verbal / Phone Orders: No Diagnosis Coding Wound Cleansing Wound #1 Left,Lateral Lower Leg o Cleanse wound with mild soap and water o May shower with protection. Anesthetic Wound #1 Left,Lateral Lower Leg o Topical Lidocaine 4% cream applied to wound bed prior to debridement Skin Barriers/Peri-Wound Care Wound #1 Left,Lateral Lower Leg o Barrier cream o Moisturizing lotion Primary Wound Dressing Wound #1 Left,Lateral Lower Leg o Prisma Ag Secondary Dressing Wound #1 Left,Lateral Lower Leg o Gauze and Kerlix/Conform Dressing Change Frequency Wound #1 Left,Lateral Lower Leg o Change dressing every other day. Follow-up Appointments Wound #1 Left,Lateral Lower Leg o Return Appointment in 1 week. Edema Control Wound #1 Left,Lateral Lower Leg o Elevate legs to the level of the heart and pump ankles as  often as possible Felkins, Samvel [name]. ([id number]) Additional Orders / Instructions Wound #1 Left,Lateral Lower Leg o Increase protein intake. o Activity as tolerated Electronic Signature(s) Signed: 04/30/2017 11:54:07 AM By: Evlyn Kanner MD, FACS Signed: 04/30/2017 4:07:04 PM By: Alejandro Mulling Entered By: Alejandro Mulling  on 04/30/2017 08:28:26 [name] [name] [name] [name] ([id number]) -------------------------------------------------------------------------------- Problem List Details Patient Name: Cappella, Aarish [name]. Date of Service: 04/30/2017 8:00 AM Medical Record Number: [id number] Patient Account Number: [id number] Date of Birth/Sex: 1941/07/11 (76 y.o. 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 Date of Service: 04/30/2017 8:00 AM Medical Record Number: [id number] Patient Account Number: [id number] Date of Birth/Sex: June 04, 1941 (76 y.o. Male) Treating RN: Phillis Haggis Primary Care Provider: Windle Guard Other Clinician: Referring Provider: Windle Guard Treating Provider/Extender: [name] [name] in Treatment: 6 Subjective Chief Complaint Information obtained  from Patient Left LE Ulcer History of Present Illness (HPI) 03/18/17 on evaluation today patient presents for initial visit concerning an ulcer he has on the left lateral lower extremity which has been present since mid February 2018. Unfortunately due to other family circumstances evaluation and treatment of this wound has been put on hold until this point when they could finally get him into be seen. He does have high blood pressure but has no history of diabetes though he does tell me that his cardiologist had been monitoring him for hyperglycemia and he is "borderline". He does have some discomfort in regard to this wound but fortunately this is not severe and typically with cleansing. His main concern is why this wound is not healing as in the past he has never had any difficulty with healing. The initial injury was during the period of time where he was helping a friend with some work and scrape the leg on a brick during that process. It has just never healed since that time. Patient is on chronic anticoagulant therapy and has not had any cardiovascular procedures such as arterial or venous imaging. 03/25/2017 - I'm seeing the patient for the first time today and notes that he has had a injury to the left lower extremity since mid February and from what I understand a hematoma opened out into a lacerated wound and this has been slow to heal. He has had no history of previous arterial or venous problems and has had some cardiac history with stents placed. He is not a diabetic and not a smoker. During his previous visit there has been a arterial and venous duplex study ordered and this is pending 04/01/2017 -- arterial and venous duplex studies still pending 04/08/2017 -- lower extremity venous duplex reflux evaluation done on 04/02/2017 showed no evidence of venous incompetence in both left and right great saphenous veins. lower extremity arterial duplex evaluation done on 04/06/2017 -- no  evidence of hemodynamically significant arterial occlusive disease bilaterally. the right ABI was 1.19 with a toe pressure of 0.83 and the left ABI was 1.25 with a toe pressures of 0.78 and triphasic flow. these were all normal. 04/15/17 on evaluation today patient appears to be doing fairly well in regard to his right lower extremity wound. He has been tolerating the dressing without complication. His biggest thing is that he states he was "hoping that  today would be the last visit and he will not have to come back". Nonetheless he still has an open wound noted and I explained to him that we do want to see him back to obviously work toward getting this to close appropriately. Fortunately we did have the results as noted above of his arterial duplex study and venous studies which appear to be doing very well. Overall I do believe he is progressing nicely. No fevers, chills, nausea, or vomiting noted at this time. 04/22/17 on evaluation today patient's left lower from the wound appears to be doing about the same as last week's evaluation. He also has a superficial skin tear in the lateral periwound that I believe is due to the [name] [name] [name] [name]. ([id number]) current dressing that is the [facility] Dressing actually sticking to his skin. I think this could be potentially damaging the wound bed as well. With that being said he is not having any significant pain. No fevers, chills, nausea, or vomiting noted at this time. Objective Constitutional Pulse regular. Respirations normal and unlabored. Afebrile. Vitals Time Taken: 8:14 AM, Height: 72 in, Weight: 226 lbs, BMI: 30.6, Temperature: 97.7 F, Pulse: 90 bpm, Respiratory Rate: 16 breaths/min, Blood Pressure: 95/58 mmHg. Eyes Nonicteric. Reactive to light. Ears, Nose, Mouth, and Throat Lips, teeth, and gums WNL.[name] Moist mucosa without lesions. Neck supple and nontender. No palpable supraclavicular or cervical adenopathy. Normal sized without  goiter. Respiratory WNL. No retractions.. Cardiovascular Pedal Pulses WNL. No clubbing, cyanosis or edema. Lymphatic No adneopathy. No adenopathy. No adenopathy. Musculoskeletal Adexa without tenderness or enlargement.. Digits and nails w/o clubbing, cyanosis, infection, petechiae, ischemia, or inflammatory conditions.[name] Psychiatric Judgement and insight Intact.. No evidence of depression, anxiety, or agitation.. General Notes: the patient's wound has some superficial eschar covering it and once this was gently removed with moist saline gauze there is a small 3 mm area which is still open but the supple scar tissue is looking good [name] [name] [name] [name]. ([id number]) Integumentary (Hair, Skin) No suspicious lesions. No crepitus or fluctuance. No peri-wound warmth or erythema. No masses.. Wound #1 status is Open. Original cause of wound was Gradually Appeared. The wound is located on the Left,Lateral Lower Leg. The wound measures 0.5cm length x 0.5cm width x 0.1cm depth; 0.196cm^2 area and 0.02cm^3 volume. There is Fat Layer (Subcutaneous Tissue) Exposed exposed. There is no tunneling or undermining noted. There is a large amount of serosanguineous drainage noted. The wound margin is flat and intact. There is large (67-100%) red granulation within the wound bed. There is a small (1-33%) amount of necrotic tissue within the wound bed including Adherent Slough. The periwound skin appearance exhibited: Induration, Ecchymosis. The periwound skin appearance did not exhibit: Callus, Crepitus, Excoriation, Rash, Scarring, Dry/Scaly, Maceration, Atrophie Blanche, Cyanosis, Hemosiderin Staining, Mottled, Pallor, Rubor, Erythema. Periwound temperature was noted as No Abnormality. Assessment Active Problems ICD-10 [phone] - Non-pressure chronic ulcer of other part of left lower leg with fat layer exposed I10 - Essential (primary) hypertension Plan Wound Cleansing: Wound #1 Left,Lateral Lower  Leg: Cleanse wound with mild soap and water May shower with protection. Anesthetic: Wound #1 Left,Lateral Lower Leg: Topical Lidocaine 4% cream applied to wound bed prior to debridement Skin Barriers/Peri-Wound Care: Wound #1 Left,Lateral Lower Leg: Barrier cream Moisturizing lotion Primary Wound Dressing: Wound #1 Left,Lateral Lower Leg: Prisma Ag Secondary Dressing: Wound #1 Left,Lateral Lower Leg: Gauze and Kerlix/Conform Dressing Change Frequency: [name] [name] [name] [name]. ([id number]) Wound #1 Left,Lateral Lower Leg: Change dressing every other day. Follow-up Appointments:  Wound #1 Left,Lateral Lower Leg: Return Appointment in 1 week. Edema Control: Wound #1 Left,Lateral Lower Leg: Elevate legs to the level of the heart and pump ankles as often as possible Additional Orders / Instructions: Wound #1 Left,Lateral Lower Leg: Increase protein intake. Activity as tolerated I have recommended we continue with Prisma AG and bordered foam and I have discussed with him washing it with soap and water on every other day and gently scrubbing of the eschar. The patient has other commitments and does not want to return to the wound center and says he will be able to manage this wound going forward. He is welcome to come back but as per his decision he wants to discharge himself from the wound clinic Electronic Signature(s) Signed: 04/30/2017 11:54:07 AM By: Evlyn Kanner MD, FACS Entered By: Evlyn Kanner on 04/30/2017 08:41:51 [name] [name] [name] [name] ([id number]) -------------------------------------------------------------------------------- SuperBill Details Patient Name: [name] [name] [name] [name]. Date of Service: 04/30/2017 Medical Record Number: [id number] Patient Account Number: [id number] Date of Birth/Sex: 08-01-1941 (76 y.o. Male) Treating RN: Phillis Haggis Primary Care Provider: Windle Guard Other Clinician: Referring Provider: Windle Guard Treating Provider/Extender: [name] [name] in  Treatment: 6 Diagnosis Coding ICD-10 Codes Code Description [phone] Non-pressure chronic ulcer of other part of left lower leg with fat layer exposed I10 Essential (primary) hypertension Facility Procedures CPT4 Code: [id number] Description: 99213 - WOUND CARE VISIT-LEV 3 EST PT Modifier: Quantity: 1 Physician Procedures CPT4: Description Modifier Quantity Code [id number] 99213 - WC PHYS LEVEL 3 - EST PT 1 ICD-10 Description Diagnosis L97.822 Non-pressure chronic ulcer of other part of left lower leg with fat layer exposed I10 Essential (primary) hypertension Electronic Signature(s) Signed: 04/30/2017 11:54:07 AM By: Evlyn Kanner MD, FACS Signed: 04/30/2017 4:07:04 PM By: Alejandro Mulling Entered By: Alejandro Mulling on 04/30/2017 11:33:06

## 2017-05-03 NOTE — Progress Notes (Addendum)
[name], [name] ([id number]) Visit Report for 04/30/2017 Arrival Information Details Patient Name: [name], REPKE. Date of Service: 04/30/2017 8:00 AM Medical Record Number: [id number] Patient Account Number: [id number] Date of Birth/Sex: 1940/12/07 (76 y.o. Male) Treating RN: Phillis Haggis Primary Care Kaisy Severino: Windle Guard Other Clinician: Referring Broughton Eppinger: Windle Guard Treating Cinzia Devos/Extender: Rudene Re in Treatment: 6 Visit Information History Since Last Visit All ordered tests and consults were completed: No Patient Arrived: Ambulatory Added or deleted any medications: No Arrival Time: 08:06 Any new allergies or adverse reactions: No Accompanied By: daughter Had a fall or experienced change in No Transfer Assistance: None activities of daily living that may affect Patient Identification Verified: Yes risk of falls: Secondary Verification Process Yes Signs or symptoms of abuse/neglect since last No Completed: visito Patient Requires Transmission- No Hospitalized since last visit: No Based Precautions: Has Dressing in Place as Prescribed: Yes Patient Has Alerts: Yes Pain Present Now: No Patient Alerts: Patient on Blood Thinner ELIQUIS Electronic Signature(s) Signed: 04/30/2017 4:07:04 PM By: Alejandro Mulling Entered By: Alejandro Mulling on 04/30/2017 08:14:01 [name], [name] ([id number]) -------------------------------------------------------------------------------- Clinic Level of Care Assessment Details Patient Name: [name], [name]. Date of Service: 04/30/2017 8:00 AM Medical Record Number: [id number] Patient Account Number: [id number] Date of Birth/Sex: 1941/03/24 (76 y.o. Male) Treating RN: Phillis Haggis Primary Care Melesa Lecy: Windle Guard Other Clinician: Referring Triston Lisanti: Windle Guard Treating Braidyn Peace/Extender: Rudene Re in Treatment: 6 Clinic Level of Care Assessment Items TOOL 4 Quantity Score X - Use when only an  EandM is performed on FOLLOW-UP visit 1 0 ASSESSMENTS - Nursing Assessment / Reassessment X - Reassessment of Co-morbidities (includes updates in patient status) 1 10 X - Reassessment of Adherence to Treatment Plan 1 5 ASSESSMENTS - Wound and Skin Assessment / Reassessment X - Simple Wound Assessment / Reassessment - one wound 1 5 []  - Complex Wound Assessment / Reassessment - multiple wounds 0 []  - Dermatologic / Skin Assessment (not related to wound area) 0 ASSESSMENTS - Focused Assessment []  - Circumferential Edema Measurements - multi extremities 0 []  - Nutritional Assessment / Counseling / Intervention 0 []  - Lower Extremity Assessment (monofilament, tuning fork, pulses) 0 []  - Peripheral Arterial Disease Assessment (using hand held doppler) 0 ASSESSMENTS - Ostomy and/or Continence Assessment and Care []  - Incontinence Assessment and Management 0 []  - Ostomy Care Assessment and Management (repouching, etc.) 0 PROCESS - Coordination of Care X - Simple Patient / Family Education for ongoing care 1 15 []  - Complex (extensive) Patient / Family Education for ongoing care 0 []  - Staff obtains Chiropractor, Records, Test Results / Process Orders 0 []  - Staff telephones HHA, Nursing Homes / Clarify orders / etc 0 []  - Routine Transfer to another Facility (non-emergent condition) 0 [name], [name]. ([id number]) []  - Routine Hospital Admission (non-emergent condition) 0 []  - New Admissions / Manufacturing engineer / Ordering NPWT, Apligraf, etc. 0 []  - Emergency Hospital Admission (emergent condition) 0 X - Simple Discharge Coordination 1 10 []  - Complex (extensive) Discharge Coordination 0 PROCESS - Special Needs []  - Pediatric / Minor Patient Management 0 []  - Isolation Patient Management 0 []  - Hearing / Language / Visual special needs 0 []  - Assessment of Community assistance (transportation, D/C planning, etc.) 0 []  - Additional assistance / Altered mentation 0 []  - Support Surface(s)  Assessment (bed, cushion, seat, etc.) 0 INTERVENTIONS - Wound Cleansing / Measurement X - Simple Wound Cleansing - one wound 1 5 []  - Complex Wound Cleansing - multiple  wounds 0 X - Wound Imaging (photographs - any number of wounds) 1 5 []  - Wound Tracing (instead of photographs) 0 X - Simple Wound Measurement - one wound 1 5 []  - Complex Wound Measurement - multiple wounds 0 INTERVENTIONS - Wound Dressings X - Small Wound Dressing one or multiple wounds 1 10 []  - Medium Wound Dressing one or multiple wounds 0 []  - Large Wound Dressing one or multiple wounds 0 X - Application of Medications - topical 1 5 []  - Application of Medications - injection 0 INTERVENTIONS - Miscellaneous []  - External ear exam 0 [name], [name]. ([id number]) []  - Specimen Collection (cultures, biopsies, blood, body fluids, etc.) 0 []  - Specimen(s) / Culture(s) sent or taken to Lab for analysis 0 []  - Patient Transfer (multiple staff / Michiel Sites Lift / Similar devices) 0 []  - Simple Staple / Suture removal (25 or less) 0 []  - Complex Staple / Suture removal (26 or more) 0 []  - Hypo / Hyperglycemic Management (close monitor of Blood Glucose) 0 []  - Ankle / Brachial Index (ABI) - do not check if billed separately 0 X - Vital Signs 1 5 Has the patient been seen at the hospital within the last three years: Yes Total Score: 80 Level Of Care: New/Established - Level 3 Electronic Signature(s) Signed: 04/30/2017 4:07:04 PM By: Alejandro Mulling Entered By: Alejandro Mulling on 04/30/2017 11:32:59 [name], [name] ([id number]) -------------------------------------------------------------------------------- Encounter Discharge Information Details Patient Name: [name], [name]. Date of Service: 04/30/2017 8:00 AM Medical Record Number: [id number] Patient Account Number: [id number] Date of Birth/Sex: 1941/03/24 (76 y.o. Male) Treating RN: Phillis Haggis Primary Care Marzell Allemand: Windle Guard Other Clinician: Referring  Felix Pratt: Windle Guard Treating Therron Sells/Extender: Rudene Re in Treatment: 6 Encounter Discharge Information Items Discharge Pain Level: 0 Discharge Condition: Stable Ambulatory Status: Ambulatory Discharge Destination: Home Transportation: Private Auto Accompanied By: daughter Schedule Follow-up Appointment: Yes Medication Reconciliation completed and provided to Patient/Care No Josehua Hammar: Provided on Clinical Summary of Care: 04/30/2017 Form Type Recipient Paper Patient [facility] Electronic Signature(s) Signed: 05/03/2017 9:40:42 AM By: Gwenlyn Perking Entered By: Gwenlyn Perking on 04/30/2017 08:36:16 [name], [name] ([id number]) -------------------------------------------------------------------------------- Lower Extremity Assessment Details Patient Name: Grondin, Ezekiel [name]. Date of Service: 04/30/2017 8:00 AM Medical Record Number: [id number] Patient Account Number: [id number] Date of Birth/Sex: December 12, 1940 (76 y.o. Male) Treating RN: Phillis Haggis Primary Care Danylah Holden: Windle Guard Other Clinician: Referring Taneah Masri: Windle Guard Treating Sheamus Hasting/Extender: Rudene Re in Treatment: 6 Vascular Assessment Pulses: Dorsalis Pedis Palpable: [Left:Yes] Posterior Tibial Extremity colors, hair growth, and conditions: Extremity Color: [Left:Normal] Hair Growth on Extremity: [Left:Yes] Temperature of Extremity: [Left:Warm] Capillary Refill: [Left:> 3 seconds] Toe Nail Assessment Left: Right: Thick: Yes Discolored: Yes Deformed: No Improper Length and Hygiene: Yes Electronic Signature(s) Signed: 04/30/2017 4:07:04 PM By: Alejandro Mulling Entered By: Alejandro Mulling on 04/30/2017 08:24:15 [name], [name] ([id number]) -------------------------------------------------------------------------------- Multi Wound Chart Details Patient Name: Monks, Mecca [name]. Date of Service: 04/30/2017 8:00 AM Medical Record Number: [id number] Patient Account Number:  [id number] Date of Birth/Sex: October 27, 1940 (76 y.o. Male) Treating RN: Phillis Haggis Primary Care Janann Boeve: Windle Guard Other Clinician: Referring Marcella Dunnaway: Windle Guard Treating Corday Wyka/Extender: Rudene Re in Treatment: 6 Vital Signs Height(in): 72 Pulse(bpm): 90 Weight(lbs): 226 Blood Pressure 95/58 (mmHg): Body Mass Index(BMI): 31 Temperature(F): 97.7 Respiratory Rate 16 (breaths/min): Photos: [1:No Photos] [N/A:N/A] Wound Location: [1:Left Lower Leg - Lateral] [N/A:N/A] Wounding Event: [1:Gradually Appeared] [N/A:N/A] Primary Etiology: [1:Trauma, Other] [N/A:N/A] Comorbid History: [1:Cataracts, Arrhythmia, Hypertension, Peripheral Arterial Disease, Peripheral Venous Disease, Gout, Osteoarthritis] [  N/A:N/A] Date Acquired: [1:10/13/2016] [N/A:N/A] Weeks of Treatment: [1:6] [N/A:N/A] Wound Status: [1:Open] [N/A:N/A] Measurements [name] x W x D 0.5x0.5x0.1 [N/A:N/A] (cm) Area (cm) : [1:0.196] [N/A:N/A] Volume (cm) : [1:0.02] [N/A:N/A] % Reduction in Area: [1:96.00%] [N/A:N/A] % Reduction in Volume: 98.00% [N/A:N/A] Classification: [1:Full Thickness Without Exposed Support Structures] [N/A:N/A] Exudate Amount: [1:Large] [N/A:N/A] Exudate Type: [1:Serosanguineous] [N/A:N/A] Exudate Color: [1:red, brown] [N/A:N/A] Wound Margin: [1:Flat and Intact] [N/A:N/A] Granulation Amount: [1:Large (67-100%)] [N/A:N/A] Granulation Quality: [1:Red] [N/A:N/A] Necrotic Amount: [1:Small (1-33%)] [N/A:N/A] Exposed Structures: [N/A:N/A] Fat Layer (Subcutaneous Tissue) Exposed: Yes Fascia: No Tendon: No Muscle: No Joint: No Bone: No Epithelialization: Medium (34-66%) N/A N/A Periwound Skin Texture: Induration: Yes N/A N/A Excoriation: No Callus: No Crepitus: No Rash: No Scarring: No Periwound Skin Maceration: No N/A N/A Moisture: Dry/Scaly: No Periwound Skin Color: Ecchymosis: Yes N/A N/A Atrophie Blanche: No Cyanosis: No Erythema: No Hemosiderin Staining:  No Mottled: No Pallor: No Rubor: No Temperature: No Abnormality N/A N/A Tenderness on No N/A N/A Palpation: Wound Preparation: Ulcer Cleansing: N/A N/A Rinsed/Irrigated with Saline Topical Anesthetic Applied: Other: lidocaine 4% Treatment Notes Wound #1 (Left, Lateral Lower Leg) 1. Cleansed with: Clean wound with Normal Saline 2. Anesthetic Topical Lidocaine 4% cream to wound bed prior to debridement 4. Dressing Applied: Prisma Ag 5. Secondary Dressing Applied Dry Gauze Kerlix/Conform 7. Secured with Tape [name], [name] ([id number]) Electronic Signature(s) Signed: 04/30/2017 11:54:07 AM By: Evlyn Kanner MD, FACS Entered By: Evlyn Kanner on 04/30/2017 08:39:56 Primo, [name] ([id number]) -------------------------------------------------------------------------------- Multi-Disciplinary Care Plan Details Patient Name: [name], [name]. Date of Service: 04/30/2017 8:00 AM Medical Record Number: [id number] Patient Account Number: [id number] Date of Birth/Sex: 1941-02-05 (76 y.o. Male) Treating RN: Phillis Haggis Primary Care Cosima Prentiss: Windle Guard Other Clinician: Referring Enzo Treu: Windle Guard Treating Benjamen Koelling/Extender: Rudene Re in Treatment: 6 Active Inactive Electronic Signature(s) Signed: 05/04/2017 4:37:15 PM By: Alejandro Mulling Signed: 05/04/2017 5:02:20 PM By: Elliot Gurney BSN, RN, CWS, Kim RN, BSN Previous Signature: 04/30/2017 4:07:04 PM Version By: Alejandro Mulling Entered By: Elliot Gurney BSN, RN, CWS, Kim on 05/04/2017 10:55:28 Lyssy, [name] ([id number]) -------------------------------------------------------------------------------- Pain Assessment Details Patient Name: [name], [name]. Date of Service: 04/30/2017 8:00 AM Medical Record Number: [id number] Patient Account Number: [id number] Date of Birth/Sex: [birth year] (76 y.o. Male) Treating RN: Phillis Haggis Primary Care Lindwood Mogel: Windle Guard Other Clinician: Referring Issabella Rix: Windle Guard Treating Kashten Gowin/Extender: Rudene Re in Treatment: 6 Active Problems Location of Pain Severity and Description of Pain Patient Has Paino No Site Locations Pain Management and Medication Current Pain Management: Electronic Signature(s) Signed: 04/30/2017 4:07:04 PM By: Alejandro Mulling Entered By: Alejandro Mulling on 04/30/2017 08:14:07 [name], [name] ([id number]) -------------------------------------------------------------------------------- Patient/Caregiver Education Details Patient Name: [name], [name]. Date of Service: 04/30/2017 8:00 AM Medical Record Number: [id number] Patient Account Number: [id number] Date of Birth/Gender: 11-20-1940 (76 y.o. Male) Treating RN: Phillis Haggis Primary Care Physician: Windle Guard Other Clinician: Referring Physician: Windle Guard Treating Physician/Extender: Rudene Re in Treatment: 6 Education Assessment Education Provided To: Patient Education Topics Provided Wound/Skin Impairment: Handouts: Other: change dressing as ordered Methods: Demonstration, Explain/Verbal Responses: State content correctly Electronic Signature(s) Signed: 04/30/2017 4:07:04 PM By: Alejandro Mulling Entered By: Alejandro Mulling on 04/30/2017 08:29:55 [name], [name] ([id number]) -------------------------------------------------------------------------------- Wound Assessment Details Patient Name: Fleissner, Niyam [name]. Date of Service: 04/30/2017 8:00 AM Medical Record Number: [id number] Patient Account Number: [id number] Date of Birth/Sex: 09/27/1940 (76 y.o. Male) Treating RN: Phillis Haggis Primary Care Genae Strine: Windle Guard Other Clinician: Referring Nayelly Laughman: Windle Guard Treating Rheta Hemmelgarn/Extender: Rudene Re  in Treatment: 6 Wound Status Wound Number: 1 Primary Trauma, Other Etiology: Wound Location: Left Lower Leg - Lateral Wound Open Wounding Event: Gradually Appeared Status: Date Acquired:  10/13/2016 Comorbid Cataracts, Arrhythmia, Hypertension, Weeks Of Treatment: 6 History: Peripheral Arterial Disease, Peripheral Clustered Wound: No Venous Disease, Gout, Osteoarthritis Photos Photo Uploaded By: Alejandro Mulling on 04/30/2017 16:05:15 Wound Measurements Length: (cm) 0.5 Width: (cm) 0.5 Depth: (cm) 0.1 Area: (cm) 0.196 Volume: (cm) 0.02 % Reduction in Area: 96% % Reduction in Volume: 98% Epithelialization: Medium (34-66%) Tunneling: No Undermining: No Wound Description Full Thickness Without Exposed Foul Odor Afte Classification: Support Structures Slough/Fibrino Wound Margin: Flat and Intact Exudate Large Amount: Exudate Type: Serosanguineous Exudate Color: red, brown r Cleansing: No No Wound Bed Granulation Amount: Large (67-100%) Exposed Structure Granulation Quality: Red Fascia Exposed: No Berroa, Davion [name]. ([id number]) Necrotic Amount: Small (1-33%) Fat Layer (Subcutaneous Tissue) Exposed: Yes Necrotic Quality: Adherent Slough Tendon Exposed: No Muscle Exposed: No Joint Exposed: No Bone Exposed: No Periwound Skin Texture Texture Color No Abnormalities Noted: No No Abnormalities Noted: No Callus: No Atrophie Blanche: No Crepitus: No Cyanosis: No Excoriation: No Ecchymosis: Yes Induration: Yes Erythema: No Rash: No Hemosiderin Staining: No Scarring: No Mottled: No Pallor: No Moisture Rubor: No No Abnormalities Noted: No Dry / Scaly: No Temperature / Pain Maceration: No Temperature: No Abnormality Wound Preparation Ulcer Cleansing: Rinsed/Irrigated with Saline Topical Anesthetic Applied: Other: lidocaine 4%, Electronic Signature(s) Signed: 04/30/2017 4:07:04 PM By: Alejandro Mulling Entered By: Alejandro Mulling on 04/30/2017 08:23:21 Bolen, [name] ([id number]) -------------------------------------------------------------------------------- Vitals Details Patient Name: Grabill, Fintan [name]. Date of Service: 04/30/2017 8:00  AM Medical Record Number: [id number] Patient Account Number: [id number] Date of Birth/Sex: February 13, 1941 (76 y.o. Male) Treating RN: Phillis Haggis Primary Care Jackie Littlejohn: Windle Guard Other Clinician: Referring Ricca Melgarejo: Windle Guard Treating Avery Klingbeil/Extender: Rudene Re in Treatment: 6 Vital Signs Time Taken: 08:14 Temperature (F): 97.7 Height (in): 72 Pulse (bpm): 90 Weight (lbs): 226 Respiratory Rate (breaths/min): 16 Body Mass Index (BMI): 30.6 Blood Pressure (mmHg): 95/58 Reference Range: 80 - 120 mg / dl Electronic Signature(s) Signed: 04/30/2017 4:07:04 PM By: Alejandro Mulling Entered By: Alejandro Mulling on 04/30/2017 08:15:53

## 2017-05-05 ENCOUNTER — Telehealth: Payer: Self-pay | Admitting: Cardiovascular Disease

## 2017-05-05 ENCOUNTER — Encounter: Payer: Self-pay | Admitting: Cardiovascular Disease

## 2017-05-05 ENCOUNTER — Ambulatory Visit (INDEPENDENT_AMBULATORY_CARE_PROVIDER_SITE_OTHER): Payer: Medicare Other | Admitting: Cardiovascular Disease

## 2017-05-05 VITALS — BP 100/60 | HR 68 | Ht 71.0 in | Wt 221.0 lb

## 2017-05-05 DIAGNOSIS — I2510 Atherosclerotic heart disease of native coronary artery without angina pectoris: Secondary | ICD-10-CM | POA: Diagnosis not present

## 2017-05-05 DIAGNOSIS — I5022 Chronic systolic (congestive) heart failure: Secondary | ICD-10-CM | POA: Diagnosis not present

## 2017-05-05 LAB — BASIC METABOLIC PANEL
BUN/Creatinine Ratio: 20 (ref 10–24)
BUN: 21 mg/dL (ref 8–27)
CALCIUM: 9.4 mg/dL (ref 8.6–10.2)
CO2: 23 mmol/L (ref 20–29)
CREATININE: 1.07 mg/dL (ref 0.76–1.27)
Chloride: 100 mmol/L (ref 96–106)
GFR calc Af Amer: 77 mL/min/{1.73_m2} (ref >59)
GFR, EST NON AFRICAN AMERICAN: 67 mL/min/{1.73_m2} (ref >59)
GLUCOSE: 168 mg/dL — AB (ref 65–99)
Potassium: 4.4 mmol/L (ref 3.5–5.2)
SODIUM: 140 mmol/L (ref 134–144)

## 2017-05-05 LAB — HEPATIC FUNCTION PANEL
ALBUMIN: 4 g/dL (ref 3.5–4.8)
ALK PHOS: 69 IU/L (ref 39–117)
ALT: 19 IU/L (ref 0–44)
AST: 20 IU/L (ref 0–40)
BILIRUBIN TOTAL: 0.8 mg/dL (ref 0.0–1.2)
Bilirubin, Direct: 0.23 mg/dL (ref 0.00–0.40)
TOTAL PROTEIN: 6.6 g/dL (ref 6.0–8.5)

## 2017-05-05 LAB — LIPID PANEL
CHOL/HDL RATIO: 3.6 ratio (ref 0.0–5.0)
Cholesterol, Total: 114 mg/dL (ref 100–199)
HDL: 32 mg/dL — AB (ref >39)
LDL CALC: 55 mg/dL (ref 0–99)
Triglycerides: 134 mg/dL (ref 0–149)
VLDL Cholesterol Cal: 27 mg/dL (ref 5–40)

## 2017-05-05 MED ORDER — CARVEDILOL 6.25 MG PO TABS: 6.2500 mg | ORAL_TABLET | Freq: Two times a day (BID) | ORAL | 3 refills | Status: DC

## 2017-05-05 NOTE — Telephone Encounter (Signed)
New message    Pt daughter wants to inform the doctor that pt BP has been running low at his other check ups. She wanted to make sure they were aware.
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
## 2017-05-05 NOTE — Telephone Encounter (Signed)
Dr. Elease HashimotoNahser is aware and has discussed this with the patient during the office visit

## 2017-05-05 NOTE — Patient Instructions (Signed)
Medication Instructions:  DECREASE Carvedilol (Coreg) to 6.25 mg twice daily   Labwork: TODAY - cholesterol, basic metabolic panel, liver panel   Testing/Procedures: None Ordered   Follow-Up: Your physician recommends that you schedule a follow-up appointment in: 3 months with Dr. Elease HashimotoNahser.    If you need a refill on your cardiac medications before your next appointment, please call your pharmacy.   Thank you for choosing [facility]! Eligha BridegroomMichelle Swinyer, RN [phone]

## 2017-05-06 ENCOUNTER — Telehealth: Payer: Self-pay | Admitting: Cardiovascular Disease

## 2017-05-06 NOTE — Telephone Encounter (Signed)
Patient calling back and states that he made a mistake when reporting how much carvedilol he was taking when he was seen by Dr. Elease HashimotoNahser yesterday. Patient states that he thought he was taking carvedilol 12.5 mg (1 tablet) BID, but he states that he was already taking 12.5 mg (1/2 tablet) BID. So patient states that he was already taking 6.25 mg BID. Patient states that his BP today has been 118/71 and 117/68 HR in the 60s. Patient denies having any symptoms at this time. Patient wanting to let Dr. Elease HashimotoNahser know and see what his recommendations are. Made patient aware that the information would be forwarded for review and recommendation. Patient verbalized understanding.

## 2017-05-06 NOTE — Telephone Encounter (Signed)
Follow Up: ° ° ° ° °Pt would like his lab results from yesterday please. °: °

## 2017-05-06 NOTE — Telephone Encounter (Signed)
Follow up      Pt c/o BP issue: STAT if pt c/o blurred vision, one-sided weakness or slurred speech  1. What are your last 5 BP readings?  118/71 and 117/68  2. Are you having any other symptoms (ex. Dizziness, headache, blurred vision, passed out)?  no  3. What is your BP issue? Calling to give nurse bp readings

## 2017-05-06 NOTE — Telephone Encounter (Signed)
-----   Message from Vesta MixerPhilip J Nahser, MD sent at 05/05/2017  4:03 PM EDT ----- Glucose is elevated.  Otherwise , labs are stable

## 2017-05-06 NOTE — Telephone Encounter (Signed)
Patient made aware of results. Patient verbalizes understanding.  

## 2017-05-07 MED ORDER — CARVEDILOL 3.125 MG PO TABS: 3.1250 mg | ORAL_TABLET | Freq: Two times a day (BID) | ORAL | 3 refills | Status: DC

## 2017-05-07 NOTE — Telephone Encounter (Signed)
Spoke with patient and reviewed Dr. Elease HashimotoNahser 's advice to reduce Coreg to 3.125 mg twice daily. I advised that a new Rx has been sent to patient's pharmacy. I advised him to continue to monitor HR and BP and to call back with questions or concerns. He verbalized understanding and agreement and thanked me for the call.

## 2017-05-07 NOTE — Telephone Encounter (Signed)
Lets reduce the coreg to 3.125 mg BID  I have amended my previous notes

## 2017-06-07 ENCOUNTER — Ambulatory Visit (INDEPENDENT_AMBULATORY_CARE_PROVIDER_SITE_OTHER): Payer: Medicare Other | Admitting: *Deleted

## 2017-06-07 DIAGNOSIS — I255 Ischemic cardiomyopathy: Secondary | ICD-10-CM | POA: Diagnosis not present

## 2017-06-07 NOTE — Progress Notes (Signed)
Remote ICD transmission.   

## 2017-06-09 ENCOUNTER — Telehealth: Payer: Self-pay | Admitting: Nurse Practitioner

## 2017-06-09 ENCOUNTER — Encounter: Payer: Self-pay | Admitting: Cardiology

## 2017-06-09 NOTE — Telephone Encounter (Signed)
New message   Patient states he is returning call to [employer]  Attempted to make appointment for patient, he declined to schedule next available on 10/24. Please call

## 2017-06-10 LAB — CUP PACEART REMOTE DEVICE CHECK
Battery Remaining Longevity: 61 mo
Battery Voltage: 2.97 V
Brady Statistic AP VP Percent: 85.39 %
Brady Statistic AP VS Percent: 0.61 %
Brady Statistic AS VP Percent: 13.2 %
Brady Statistic AS VS Percent: 0.81 %
Brady Statistic RA Percent Paced: 85.01 %
Brady Statistic RV Percent Paced: 92.82 %
Date Time Interrogation Session: 20181001122120
HighPow Impedance: 49 Ohm
HighPow Impedance: 61 Ohm
Implantable Lead Implant Date: 20040315
Implantable Lead Implant Date: 20100611
Implantable Lead Implant Date: 20100611
Implantable Lead Location: 753858
Implantable Lead Location: 753859
Implantable Lead Location: 753860
Implantable Lead Model: 4196
Implantable Lead Model: 5076
Implantable Lead Model: 6947
Implantable Pulse Generator Implant Date: 20160302
Lead Channel Impedance Value: 361 Ohm
Lead Channel Impedance Value: 399 Ohm
Lead Channel Impedance Value: 456 Ohm
Lead Channel Impedance Value: 475 Ohm
Lead Channel Impedance Value: 513 Ohm
Lead Channel Impedance Value: 722 Ohm
Lead Channel Pacing Threshold Amplitude: 0.625 V
Lead Channel Pacing Threshold Amplitude: 0.75 V
Lead Channel Pacing Threshold Pulse Width: 0.4 ms
Lead Channel Pacing Threshold Pulse Width: 0.4 ms
Lead Channel Sensing Intrinsic Amplitude: 14.75 mV
Lead Channel Sensing Intrinsic Amplitude: 14.75 mV
Lead Channel Sensing Intrinsic Amplitude: 2 mV
Lead Channel Sensing Intrinsic Amplitude: 2 mV
Lead Channel Setting Pacing Amplitude: 1 V
Lead Channel Setting Pacing Amplitude: 1.5 V
Lead Channel Setting Pacing Amplitude: 1.5 V
Lead Channel Setting Pacing Pulse Width: 0.4 ms
Lead Channel Setting Pacing Pulse Width: 1 ms
Lead Channel Setting Sensing Sensitivity: 0.3 mV

## 2017-06-11 NOTE — Telephone Encounter (Signed)
SPOKE WITH  DAUGHTER AND PT HAS BEEN  RESCHEDULED WITH AMBER 06-15-17

## 2017-06-12 NOTE — Progress Notes (Signed)
Electrophysiology Office Note Date: 06/15/2017  ID:  MORIAH LOUGHRY, DOB 11/22/1940, MRN [id number]  PCP: Kaleen Mask, MD Primary Cardiologist: Melburn Popper Electrophysiologist: Graciela Husbands  CC: Routine ICD follow-up  CHADLEY DZIEDZIC is a 76 y.o. male seen today for Dr Graciela Husbands.  He presents today for routine electrophysiology followup.  Since last being seen in our clinic, the patient reports doing reasonably well. He is seen today with his daughter. He has started doing yoga and has had an intentional 10 pound weight loss. He denies chest pain, palpitations, dyspnea, PND, orthopnea, nausea, vomiting, dizziness, syncope, edema, weight gain, or early satiety.  He has not had ICD shocks.   Device History: MDT single chamber ICD implanted 2004 for ICM (as part of MASTER study); upgrade to CRTD 2010; gen change 2016 History of appropriate therapy: Yes History of AAD therapy: No   Past Medical History:  Diagnosis Date  . Arthritis    knees, back   . BPH (benign prostatic hypertrophy)   . Chronic kidney disease    BPH  . Chronic systolic heart failure (HCC)    a. s/p MDT single chamber ICD 2004 as part of MASTER study b. upgrade to CRTD 2010; CRTD gen change 2016  . Coronary artery disease    a. s/p anterior MI and CYPHER stent to LAD 2005  . Dyslipidemia   . Gout   . Ischemic cardiomyopathy   . Paroxysmal atrial fibrillation (HCC)   . Ventricular tachycardia [facility])    Past Surgical History:  Procedure Laterality Date  . APPENDECTOMY    . BI-VENTRICULAR IMPLANTABLE CARDIOVERTER DEFIBRILLATOR UPGRADE N/A 11/07/2014   a. MDT single chamber ICD implanted 2004 as part of MASTER study; upgrade to CRTD 2010; gen change 2016  . CARDIAC CATHETERIZATION  2005  . CORONARY ANGIOPLASTY     5 stents   . KNEE ARTHROSCOPY    . OTHER SURGICAL HISTORY     right ear surgery as a child   . PROSTATECTOMY  11/06/2011   Procedure: PROSTATECTOMY SUPRAPUBIC;  Surgeon: Kathi Ludwig, MD;  Location:  [facility];  Service: Urology;  Laterality: N/A;  Open Suprapubic Prostatectomy    Current Outpatient Prescriptions  Medication Sig Dispense Refill  . allopurinol (ZYLOPRIM) 100 MG tablet Take 100 mg by mouth daily.    . carvedilol (COREG) 3.125 MG tablet Take 1 tablet (3.125 mg total) by mouth 2 (two) times daily. 180 tablet 3  . ELIQUIS 5 MG TABS tablet TAKE 1 TABLET BY MOUTH TWO  TIMES DAILY 180 tablet 1  . latanoprost (XALATAN) 0.005 % ophthalmic solution Place 1 drop into both eyes at bedtime.     [name] lisinopril (PRINIVIL,ZESTRIL) 5 MG tablet TAKE 1 TABLET BY MOUTH  EVERY NIGHT AT BEDTIME 90 tablet 1  . rosuvastatin (CRESTOR) 10 MG tablet Take 0.5 tablets (5 mg total) by mouth daily. 45 tablet 1  . spironolactone (ALDACTONE) 25 MG tablet TAKE ONE-HALF TABLET BY  MOUTH DAILY 45 tablet 2  . TURMERIC PO Take 1 tablet by mouth daily.     No current facility-administered medications for this visit.     Allergies:   Atorvastatin   Social History: Social History   Social History  . Marital status: Married    Spouse name: N/A  . Number of children: N/A  . Years of education: N/A   Occupational History  . Not on file.   Social History Main Topics  . Smoking status: Never Smoker  . Smokeless tobacco:  Never Used  . Alcohol use No     Comment: occasional beer   . Drug use: No  . Sexual activity: Not on file   Other Topics Concern  . Not on file   Social History Narrative  . No narrative on file    Family History: Family History  Problem Relation Age of Onset  . Heart disease Sister        3 59f the 4 had HD  . Heart disease Brother        2 of 3 had HD  . Heart disease Sister        1 of the 6 has HD  . Heart disease Brother     Review of Systems: All other systems reviewed and are otherwise negative except as noted above.   Physical Exam: VS:  BP 116/68   Ht  (1.803 m)   Wt 216 lb (98 kg)   BMI 30.13 kg/m  , BMI Body mass index is 30.13 kg/m.  GEN- The  patient is elderly appearing, alert and oriented x 3 today, but repeats himself often  HEENT: normocephalic, atraumatic; sclera clear, conjunctiva pink; hearing intact; oropharynx clear; neck supple Lungs- Clear to ausculation bilaterally, normal work of breathing.  No wheezes, rales, rhonchi Heart- Regular rate and rhythm  (paced) GI- soft, non-tender, non-distended, bowel sounds present Extremities- no clubbing, cyanosis, or edema; DP/PT/radial pulses 1+ bilaterally MS- no significant deformity or atrophy Skin- warm and dry, no rash or lesion; ICD pocket well healed Psych- euthymic mood, full affect Neuro- strength and sensation are intact  ICD interrogation- reviewed in detail today,  See PACEART report  EKG:  EKG is not ordered today.  Recent Labs: 05/05/2017: ALT 19; BUN 21; Creatinine, Ser 1.07; Potassium 4.4; Sodium 140   Wt Readings from Last 3 Encounters:  06/15/17 216 lb (98 kg)  05/05/17 221 lb (100.2 kg)  12/04/16 223 lb (101.2 kg)     Other studies Reviewed: Additional studies/ records that were reviewed today include: Dr Elease Hashimoto and Dr Odessa Fleming office notes  Assessment and Plan:  1.  Chronic systolic dysfunction euvolemic today Stable on an appropriate medical regimen Normal ICD function See Pace Art report No changes today  2.  ICM/CAD No recent ischemic symptoms Continue medical therapy  3.  Ventricular tachycardia No recent recurrence  4.  Paroxysmal atrial fibrillation Stable burden Continue Eliquis for CHADS2VASC of 4 BMET/CBC checked by PCP  Current medicines are reviewed at length with the patient today.   The patient does not have concerns regarding his medicines.  The following changes were made today:  none  Labs/ tests ordered today include: none  Disposition:   Follow up with [facility] transmissions, Dr Elease Hashimoto as scheduled, Dr Graciela Husbands 1 year    Signed, Gypsy Balsam, NP 06/15/2017 1:15 PM  [facility] [address] [place] [address] [phone] (office) [phone] (fax

## 2017-06-15 ENCOUNTER — Ambulatory Visit (INDEPENDENT_AMBULATORY_CARE_PROVIDER_SITE_OTHER): Payer: Medicare Other | Admitting: Nurse Practitioner

## 2017-06-15 VITALS — BP 116/68 | Ht 71.0 in | Wt 216.0 lb

## 2017-06-15 DIAGNOSIS — I472 Ventricular tachycardia, unspecified: Secondary | ICD-10-CM

## 2017-06-15 DIAGNOSIS — I480 Paroxysmal atrial fibrillation: Secondary | ICD-10-CM

## 2017-06-15 DIAGNOSIS — I255 Ischemic cardiomyopathy: Secondary | ICD-10-CM

## 2017-06-15 DIAGNOSIS — I5022 Chronic systolic (congestive) heart failure: Secondary | ICD-10-CM | POA: Diagnosis not present

## 2017-06-15 LAB — CUP PACEART INCLINIC DEVICE CHECK
Date Time Interrogation Session: 20181009131525
Implantable Lead Implant Date: 20040315
Implantable Lead Implant Date: 20100611
Implantable Lead Location: 753858
Implantable Lead Location: 753860
Implantable Lead Model: 4196
Implantable Lead Model: 5076
Implantable Pulse Generator Implant Date: 20160302
MDC IDC LEAD IMPLANT DT: 20100611
MDC IDC LEAD LOCATION: 753859

## 2017-06-15 NOTE — Patient Instructions (Addendum)
Medication Instructions:   Your physician recommends that you continue on your current medications as directed. Please refer to the Current Medication list given to you today.   If you need a refill on your cardiac medications before your next appointment, please call your pharmacy.  Labwork: NONE ORDERED  TODAY    Testing/Procedures: NONE ORDERED  TODAY    Follow-Up:  AS SCHEDULED APPOINTMENTS  Your physician wants you to follow-up in: ONE YEAR WITH Graciela Husbands  You will receive a reminder letter in the mail two months in advance. If you don't receive a letter, please call our office to schedule the follow-up appointment.     Any Other Special Instructions Will Be Listed Below (If Applicable).

## 2017-07-21 ENCOUNTER — Other Ambulatory Visit: Payer: Self-pay | Admitting: Cardiovascular Disease

## 2017-08-10 ENCOUNTER — Encounter: Payer: Self-pay | Admitting: Cardiovascular Disease

## 2017-08-10 ENCOUNTER — Ambulatory Visit: Payer: Medicare Other | Admitting: Cardiovascular Disease

## 2017-08-10 ENCOUNTER — Encounter (INDEPENDENT_AMBULATORY_CARE_PROVIDER_SITE_OTHER): Payer: Self-pay

## 2017-08-10 VITALS — BP 112/66 | HR 63 | Ht 71.0 in | Wt 222.1 lb

## 2017-08-10 DIAGNOSIS — I480 Paroxysmal atrial fibrillation: Secondary | ICD-10-CM

## 2017-08-10 DIAGNOSIS — I5022 Chronic systolic (congestive) heart failure: Secondary | ICD-10-CM

## 2017-08-10 DIAGNOSIS — I2510 Atherosclerotic heart disease of native coronary artery without angina pectoris: Secondary | ICD-10-CM
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
## 2017-08-10 NOTE — Patient Instructions (Addendum)

## 2017-09-02 ENCOUNTER — Other Ambulatory Visit: Payer: Self-pay | Admitting: Internal Medicine

## 2017-09-02 NOTE — Telephone Encounter (Signed)
Eliquis 5mg  refill request received; pt is 76 yrs old, wt-100.8kg, Crea-1.07 on 05/05/17, last seen by Dr. Elease HashimotoNahser on 08/10/17; will send in refill to requested Pharmacy.

## 2017-09-06 ENCOUNTER — Ambulatory Visit (INDEPENDENT_AMBULATORY_CARE_PROVIDER_SITE_OTHER): Payer: Medicare Other | Admitting: *Deleted

## 2017-09-06 ENCOUNTER — Telehealth: Payer: Self-pay | Admitting: Cardiology

## 2017-09-06 DIAGNOSIS — I255 Ischemic cardiomyopathy: Secondary | ICD-10-CM | POA: Diagnosis not present

## 2017-09-06 NOTE — Telephone Encounter (Signed)
Spoke with pt and reminded pt of remote transmission that is due today. Pt verbalized understanding.   

## 2017-09-08 LAB — CUP PACEART REMOTE DEVICE CHECK
Battery Voltage: 2.97 V
Brady Statistic AP VS Percent: 0.87 %
Brady Statistic RA Percent Paced: 74.09 %
Brady Statistic RV Percent Paced: 86.1 %
HIGH POWER IMPEDANCE MEASURED VALUE: 50 Ohm
HighPow Impedance: 63 Ohm
Implantable Lead Implant Date: 20040315
Implantable Lead Implant Date: 20100611
Implantable Lead Location: 753858
Implantable Lead Location: 753860
Implantable Lead Model: 4196
Implantable Lead Model: 5076
Implantable Pulse Generator Implant Date: 20160302
Lead Channel Impedance Value: 361 Ohm
Lead Channel Impedance Value: 475 Ohm
Lead Channel Impedance Value: 817 Ohm
Lead Channel Pacing Threshold Amplitude: 0.75 V
Lead Channel Pacing Threshold Amplitude: 0.75 V
Lead Channel Pacing Threshold Pulse Width: 0.4 ms
Lead Channel Sensing Intrinsic Amplitude: 11.375 mV
Lead Channel Sensing Intrinsic Amplitude: 2.375 mV
Lead Channel Setting Pacing Amplitude: 1 V
Lead Channel Setting Sensing Sensitivity: 0.3 mV
MDC IDC LEAD IMPLANT DT: 20100611
MDC IDC LEAD LOCATION: 753859
MDC IDC MSMT BATTERY REMAINING LONGEVITY: 52 mo
MDC IDC MSMT LEADCHNL LV IMPEDANCE VALUE: 418 Ohm
MDC IDC MSMT LEADCHNL LV IMPEDANCE VALUE: 513 Ohm
MDC IDC MSMT LEADCHNL RA PACING THRESHOLD PULSEWIDTH: 0.4 ms
MDC IDC MSMT LEADCHNL RA SENSING INTR AMPL: 2.375 mV
MDC IDC MSMT LEADCHNL RV IMPEDANCE VALUE: 399 Ohm
MDC IDC MSMT LEADCHNL RV SENSING INTR AMPL: 11.375 mV
MDC IDC SESS DTM: 20190101141310
MDC IDC SET LEADCHNL LV PACING PULSEWIDTH: 1 ms
MDC IDC SET LEADCHNL RA PACING AMPLITUDE: 1.5 V
MDC IDC SET LEADCHNL RV PACING AMPLITUDE: 1.5 V
MDC IDC SET LEADCHNL RV PACING PULSEWIDTH: 0.4 ms
MDC IDC STAT BRADY AP VP PERCENT: 75.45 %
MDC IDC STAT BRADY AS VP PERCENT: 20.97 %
MDC IDC STAT BRADY AS VS PERCENT: 2.71 %

## 2017-09-08 NOTE — Progress Notes (Signed)
Remote ICD transmission.   

## 2017-09-10 ENCOUNTER — Encounter: Payer: Self-pay | Admitting: Cardiology

## 2017-10-12 ENCOUNTER — Other Ambulatory Visit: Payer: Self-pay | Admitting: Family Medicine

## 2017-10-15 ENCOUNTER — Telehealth: Payer: Self-pay | Admitting: Internal Medicine

## 2017-10-15 ENCOUNTER — Other Ambulatory Visit: Payer: Self-pay | Admitting: Family Medicine

## 2017-10-15 ENCOUNTER — Other Ambulatory Visit (HOSPITAL_COMMUNITY): Payer: Self-pay | Admitting: Family Medicine

## 2017-10-15 DIAGNOSIS — R42 Dizziness and giddiness: Secondary | ICD-10-CM

## 2017-10-15 NOTE — Telephone Encounter (Signed)
New message    Larita FifeLynn from [facility] is calling to request model information on device. Larita FifeLynn also advised to [employer]. Phone [phone]  1. Has your device fired? NO  2. Is you device beeping? NO  3. Are you experiencing draining or swelling at device site?NO  4. Are you calling to see if we received your device transmission? NO  5. Have you passed out? NO    Please route to Device Clinic Pool

## 2017-10-15 NOTE — Telephone Encounter (Signed)
Called pharmacy.. They stated that they had not called regarding this patient. Pharmacist stated that there is a Customer service managerLynn at [facility] number was provided, called [facility] spoke with Larita FifeLynn she stated that they had already called [employer] about patients pacemaker regarding if it was MRI compatible or not.

## 2017-10-23 ENCOUNTER — Ambulatory Visit
Admission: RE | Admit: 2017-10-23 | Discharge: 2017-10-23 | Disposition: A | Discharge status: Home / Self Care | Payer: Medicare Other | Source: Ambulatory Visit | Attending: Family Medicine | Admitting: Family Medicine

## 2017-10-23 DIAGNOSIS — R42 Dizziness and giddiness: Secondary | ICD-10-CM

## 2017-10-23 MED ORDER — IOPAMIDOL (ISOVUE-370) INJECTION 76%
75.0000 mL | Freq: Once | INTRAVENOUS | Status: AC | PRN
  Administered 2017-10-23: 75 mL via INTRAVENOUS

## 2017-11-21 ENCOUNTER — Other Ambulatory Visit: Payer: Self-pay | Admitting: Cardiovascular Disease

## 2017-12-06 ENCOUNTER — Ambulatory Visit (INDEPENDENT_AMBULATORY_CARE_PROVIDER_SITE_OTHER): Payer: Medicare Other | Admitting: *Deleted

## 2017-12-06 DIAGNOSIS — I255 Ischemic cardiomyopathy: Secondary | ICD-10-CM | POA: Diagnosis not present

## 2017-12-06 NOTE — Progress Notes (Signed)
Remote ICD transmission.   

## 2017-12-07 ENCOUNTER — Encounter: Payer: Self-pay | Admitting: Cardiology

## 2017-12-08 LAB — CUP PACEART REMOTE DEVICE CHECK
Battery Remaining Longevity: 48 mo
Brady Statistic AP VS Percent: 0.78 %
Brady Statistic AS VS Percent: 0.63 %
Brady Statistic RV Percent Paced: 90.52 %
HIGH POWER IMPEDANCE MEASURED VALUE: 48 Ohm
HIGH POWER IMPEDANCE MEASURED VALUE: 61 Ohm
Implantable Lead Implant Date: 20040315
Implantable Lead Implant Date: 20100611
Implantable Lead Location: 753858
Implantable Lead Model: 6947
Implantable Pulse Generator Implant Date: 20160302
Lead Channel Impedance Value: 418 Ohm
Lead Channel Impedance Value: 456 Ohm
Lead Channel Impedance Value: 475 Ohm
Lead Channel Impedance Value: 779 Ohm
Lead Channel Pacing Threshold Amplitude: 0.5 V
Lead Channel Pacing Threshold Pulse Width: 0.4 ms
Lead Channel Pacing Threshold Pulse Width: 0.4 ms
Lead Channel Sensing Intrinsic Amplitude: 27.625 mV
Lead Channel Setting Pacing Amplitude: 1 V
Lead Channel Setting Pacing Amplitude: 1.5 V
Lead Channel Setting Pacing Pulse Width: 1 ms
MDC IDC LEAD IMPLANT DT: 20100611
MDC IDC LEAD LOCATION: 753859
MDC IDC LEAD LOCATION: 753860
MDC IDC MSMT BATTERY VOLTAGE: 2.96 V
MDC IDC MSMT LEADCHNL LV IMPEDANCE VALUE: 418 Ohm
MDC IDC MSMT LEADCHNL RA PACING THRESHOLD AMPLITUDE: 0.75 V
MDC IDC MSMT LEADCHNL RA SENSING INTR AMPL: 0.75 mV
MDC IDC MSMT LEADCHNL RA SENSING INTR AMPL: 0.75 mV
MDC IDC MSMT LEADCHNL RV IMPEDANCE VALUE: 475 Ohm
MDC IDC MSMT LEADCHNL RV SENSING INTR AMPL: 27.625 mV
MDC IDC SESS DTM: 20190401112305
MDC IDC SET LEADCHNL RA PACING AMPLITUDE: 1.5 V
MDC IDC SET LEADCHNL RV PACING PULSEWIDTH: 0.4 ms
MDC IDC SET LEADCHNL RV SENSING SENSITIVITY: 0.3 mV
MDC IDC STAT BRADY AP VP PERCENT: 83.42 %
MDC IDC STAT BRADY AS VP PERCENT: 15.17 %
MDC IDC STAT BRADY RA PERCENT PACED: 83.58 %

## 2018-01-14 ENCOUNTER — Other Ambulatory Visit: Payer: Self-pay

## 2018-01-14 MED ORDER — ROSUVASTATIN CALCIUM 10 MG PO TABS: 5.0000 mg | ORAL_TABLET | Freq: Every day | ORAL | 0 refills | Status: DC

## 2018-01-18 MED ORDER — ROSUVASTATIN CALCIUM 10 MG PO TABS: 5.0000 mg | ORAL_TABLET | Freq: Every day | ORAL | 2 refills | Status: DC

## 2018-01-18 NOTE — Addendum Note (Signed)
Addended by: Oleta Mouse on: 01/18/2018 09:09 AM   Modules accepted: Orders

## 2018-03-07 ENCOUNTER — Telehealth: Payer: Self-pay | Admitting: Cardiology

## 2018-03-07 ENCOUNTER — Ambulatory Visit (INDEPENDENT_AMBULATORY_CARE_PROVIDER_SITE_OTHER): Payer: Medicare Other | Admitting: *Deleted

## 2018-03-07 DIAGNOSIS — I255 Ischemic cardiomyopathy: Secondary | ICD-10-CM | POA: Diagnosis not present

## 2018-03-07 NOTE — Telephone Encounter (Signed)
LMOVM reminding pt to send remote transmission.   

## 2018-03-08 NOTE — Progress Notes (Signed)
Remote ICD transmission.   

## 2018-03-09 ENCOUNTER — Ambulatory Visit: Payer: Medicare Other | Admitting: Cardiovascular Disease

## 2018-03-09 ENCOUNTER — Encounter: Payer: Self-pay | Admitting: Cardiovascular Disease

## 2018-03-09 ENCOUNTER — Other Ambulatory Visit: Payer: Self-pay | Admitting: Cardiovascular Disease

## 2018-03-09 ENCOUNTER — Encounter: Payer: Self-pay | Admitting: Cardiology

## 2018-03-09 VITALS — BP 102/58 | HR 68 | Ht 71.0 in | Wt 214.0 lb

## 2018-03-09 DIAGNOSIS — I5042 Chronic combined systolic (congestive) and diastolic (congestive) heart failure: Secondary | ICD-10-CM | POA: Diagnosis not present

## 2018-03-09 DIAGNOSIS — I2510 Atherosclerotic heart disease of native coronary artery without angina pectoris: Secondary | ICD-10-CM | POA: Diagnosis not present

## 2018-03-09 DIAGNOSIS — Z7901 Long term (current) use of anticoagulants: Secondary | ICD-10-CM

## 2018-03-09 DIAGNOSIS — E782 Mixed hyperlipidemia: Secondary | ICD-10-CM

## 2018-03-09 LAB — BASIC METABOLIC PANEL
BUN / CREAT RATIO: 19 (ref 10–24)
BUN: 19 mg/dL (ref 8–27)
CHLORIDE: 99 mmol/L (ref 96–106)
CO2: 23 mmol/L (ref 20–29)
Calcium: 9.3 mg/dL (ref 8.6–10.2)
Creatinine, Ser: 1.02 mg/dL (ref 0.76–1.27)
GFR calc non Af Amer: 70 mL/min/{1.73_m2} (ref >59)
GFR, EST AFRICAN AMERICAN: 81 mL/min/{1.73_m2} (ref >59)
Glucose: 189 mg/dL — ABNORMAL HIGH (ref 65–99)
Potassium: 4.2 mmol/L (ref 3.5–5.2)
SODIUM: 138 mmol/L (ref 134–144)

## 2018-03-09 LAB — CBC
HEMATOCRIT: 44.7 % (ref 37.5–51.0)
Hemoglobin: 15.3 g/dL (ref 13.0–17.7)
MCH: 32.5 pg (ref 26.6–33.0)
MCHC: 34.2 g/dL (ref 31.5–35.7)
MCV: 95 fL (ref 79–97)
PLATELETS: 243 10*3/uL (ref 150–450)
RBC: 4.71 x10E6/uL (ref 4.14–5.80)
RDW: 12.8 % (ref 12.3–15.4)
WBC: 6 10*3/uL (ref 3.4–10.8)

## 2018-03-09 LAB — HEPATIC FUNCTION PANEL
ALT: 17 IU/L (ref 0–44)
AST: 17 IU/L (ref 0–40)
Albumin: 4.2 g/dL (ref 3.5–4.8)
Alkaline Phosphatase: 67 IU/L (ref 39–117)
Bilirubin Total: 0.7 mg/dL (ref 0.0–1.2)
Bilirubin, Direct: 0.24 mg/dL (ref 0.00–0.40)
TOTAL PROTEIN: 6.7 g/dL (ref 6.0–8.5)

## 2018-03-09 LAB — LIPID PANEL
CHOL/HDL RATIO: 3.5 ratio (ref 0.0–5.0)
Cholesterol, Total: 108 mg/dL (ref 100–199)
HDL: 31 mg/dL — AB (ref >39)
LDL CALC: 55 mg/dL (ref 0–99)
Triglycerides: 111 mg/dL (ref 0–149)
VLDL CHOLESTEROL CAL: 22 mg/dL (ref 5–40)
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
## 2018-03-09 NOTE — Patient Instructions (Addendum)
Grief counseling [name] [facility] [phone]  Medication Instructions:  Your physician recommends that you continue on your current medications as directed. Please refer to the Current Medication list given to you today.   Labwork: TODAY - cholesterol, liver panel, basic metabolic panel, CBC   Testing/Procedures: None Ordered   Follow-Up: Your physician wants you to follow-up in: 6 months with Dr. Elease HashimotoNahser. You will receive a reminder letter in the mail two months in advance. If you don't receive a letter, please call our office to schedule the follow-up appointment.   If you need a refill on your cardiac medications before your next appointment, please call your pharmacy.   Thank you for choosing [facility]! James BridegroomMichelle Swinyer, RN [phone]

## 2018-03-11 LAB — CUP PACEART REMOTE DEVICE CHECK
Battery Remaining Longevity: 41 mo
Battery Voltage: 2.94 V
Brady Statistic AP VP Percent: 82.75 %
Brady Statistic AS VP Percent: 14.73 %
Brady Statistic RV Percent Paced: 91.34 %
HighPow Impedance: 54 Ohm
HighPow Impedance: 71 Ohm
Implantable Lead Implant Date: 20100611
Implantable Lead Location: 753859
Implantable Lead Location: 753860
Implantable Lead Model: 4196
Implantable Lead Model: 5076
Lead Channel Impedance Value: 399 Ohm
Lead Channel Impedance Value: 513 Ohm
Lead Channel Impedance Value: 513 Ohm
Lead Channel Pacing Threshold Pulse Width: 0.4 ms
Lead Channel Pacing Threshold Pulse Width: 0.4 ms
Lead Channel Sensing Intrinsic Amplitude: 1.875 mV
Lead Channel Sensing Intrinsic Amplitude: 1.875 mV
Lead Channel Sensing Intrinsic Amplitude: 15.25 mV
Lead Channel Setting Pacing Amplitude: 1.5 V
Lead Channel Setting Pacing Pulse Width: 0.4 ms
MDC IDC LEAD IMPLANT DT: 20040315
MDC IDC LEAD IMPLANT DT: 20100611
MDC IDC LEAD LOCATION: 753858
MDC IDC MSMT LEADCHNL LV IMPEDANCE VALUE: 589 Ohm
MDC IDC MSMT LEADCHNL LV IMPEDANCE VALUE: 893 Ohm
MDC IDC MSMT LEADCHNL RA PACING THRESHOLD AMPLITUDE: 0.75 V
MDC IDC MSMT LEADCHNL RV IMPEDANCE VALUE: 456 Ohm
MDC IDC MSMT LEADCHNL RV PACING THRESHOLD AMPLITUDE: 0.625 V
MDC IDC MSMT LEADCHNL RV SENSING INTR AMPL: 15.25 mV
MDC IDC PG IMPLANT DT: 20160302
MDC IDC SESS DTM: 20190701220110
MDC IDC SET LEADCHNL LV PACING AMPLITUDE: 1 V
MDC IDC SET LEADCHNL LV PACING PULSEWIDTH: 1 ms
MDC IDC SET LEADCHNL RV PACING AMPLITUDE: 1.5 V
MDC IDC SET LEADCHNL RV SENSING SENSITIVITY: 0.3 mV
MDC IDC STAT BRADY AP VS PERCENT: 0.76 %
MDC IDC STAT BRADY AS VS PERCENT: 1.77 %
MDC IDC STAT BRADY RA PERCENT PACED: 81.62 %

## 2018-05-20 ENCOUNTER — Other Ambulatory Visit: Payer: Self-pay | Admitting: Cardiovascular Disease

## 2018-05-20 NOTE — Telephone Encounter (Signed)
Spoke with patient to clarify dose and he stated that he refills the 6.25 mg tab and takes 1/2 tab bid. He is aware that I will send this in for him.

## 2018-05-24 ENCOUNTER — Other Ambulatory Visit: Payer: Self-pay | Admitting: Cardiovascular Disease

## 2018-06-02 ENCOUNTER — Encounter: Payer: Self-pay | Admitting: Internal Medicine

## 2018-06-06 ENCOUNTER — Ambulatory Visit (INDEPENDENT_AMBULATORY_CARE_PROVIDER_SITE_OTHER): Payer: Medicare Other | Admitting: *Deleted

## 2018-06-06 DIAGNOSIS — I469 Cardiac arrest, cause unspecified: Secondary | ICD-10-CM

## 2018-06-06 DIAGNOSIS — I255 Ischemic cardiomyopathy: Secondary | ICD-10-CM

## 2018-06-06 NOTE — Progress Notes (Signed)
Remote ICD transmission.   

## 2018-06-10 LAB — CUP PACEART REMOTE DEVICE CHECK
Battery Voltage: 2.96 V
Brady Statistic AP VP Percent: 79.13 %
Brady Statistic AP VS Percent: 0.72 %
Brady Statistic AS VP Percent: 18.9 %
Brady Statistic AS VS Percent: 1.25 %
HighPow Impedance: 52 Ohm
HighPow Impedance: 65 Ohm
Implantable Lead Implant Date: 20040315
Implantable Lead Implant Date: 20100611
Implantable Lead Implant Date: 20100611
Implantable Lead Location: 753858
Implantable Lead Model: 5076
Implantable Lead Model: 6947
Lead Channel Impedance Value: 399 Ohm
Lead Channel Impedance Value: 456 Ohm
Lead Channel Impedance Value: 513 Ohm
Lead Channel Impedance Value: 513 Ohm
Lead Channel Impedance Value: 779 Ohm
Lead Channel Pacing Threshold Amplitude: 0.625 V
Lead Channel Pacing Threshold Pulse Width: 0.4 ms
Lead Channel Sensing Intrinsic Amplitude: 13.125 mV
Lead Channel Sensing Intrinsic Amplitude: 2.375 mV
Lead Channel Sensing Intrinsic Amplitude: 2.375 mV
Lead Channel Setting Pacing Amplitude: 1 V
Lead Channel Setting Pacing Amplitude: 1.5 V
Lead Channel Setting Pacing Amplitude: 1.5 V
Lead Channel Setting Pacing Pulse Width: 0.4 ms
Lead Channel Setting Pacing Pulse Width: 1 ms
Lead Channel Setting Sensing Sensitivity: 0.3 mV
MDC IDC LEAD LOCATION: 753859
MDC IDC LEAD LOCATION: 753860
MDC IDC MSMT BATTERY REMAINING LONGEVITY: 41 mo
MDC IDC MSMT LEADCHNL RA IMPEDANCE VALUE: 513 Ohm
MDC IDC MSMT LEADCHNL RV PACING THRESHOLD AMPLITUDE: 0.75 V
MDC IDC MSMT LEADCHNL RV PACING THRESHOLD PULSEWIDTH: 0.4 ms
MDC IDC MSMT LEADCHNL RV SENSING INTR AMPL: 13.125 mV
MDC IDC PG IMPLANT DT: 20160302
MDC IDC SESS DTM: 20190930115359
MDC IDC STAT BRADY RA PERCENT PACED: 78.52 %
MDC IDC STAT BRADY RV PERCENT PACED: 88.49 %

## 2018-06-21 ENCOUNTER — Ambulatory Visit: Payer: Medicare Other | Admitting: Internal Medicine

## 2018-06-21 ENCOUNTER — Encounter: Payer: Self-pay | Admitting: Internal Medicine

## 2018-06-21 VITALS — BP 114/68 | HR 64 | Ht 71.0 in | Wt 211.6 lb

## 2018-06-21 DIAGNOSIS — I480 Paroxysmal atrial fibrillation: Secondary | ICD-10-CM

## 2018-06-21 DIAGNOSIS — Z95810 Presence of automatic (implantable) cardiac defibrillator: Secondary | ICD-10-CM

## 2018-06-21 DIAGNOSIS — I469 Cardiac arrest, cause unspecified: Secondary | ICD-10-CM | POA: Diagnosis not present

## 2018-06-21 DIAGNOSIS — I255 Ischemic cardiomyopathy: Secondary | ICD-10-CM | POA: Diagnosis not present

## 2018-06-21 NOTE — Patient Instructions (Signed)
Medication Instructions:  Your physician recommends that you continue on your current medications as directed. Please refer to the Current Medication list given to you today.  Labwork: None ordered.  Testing/Procedures: None ordered.  Follow-Up: Your physician recommends that you schedule a follow-up appointment in:   12 months with Dr Graciela Husbands  Remote monitoring is used to monitor your Pacemaker of ICD from home. This monitoring reduces the number of office visits required to check your device to one time per year. It allows [place] to keep an eye on the functioning of your device to ensure it is working properly. You are scheduled for a device check from home on 09/05/2018. You may send your transmission at any time that day. If you have a wireless device, the transmission will be sent automatically. After your physician reviews your transmission, you will receive a postcard with your next transmission date.    Any Other Special Instructions Will Be Listed Below (If Applicable).     If you need a refill on your cardiac medications before your next appointment, please call your pharmacy.
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
## 2018-06-27 LAB — CUP PACEART INCLINIC DEVICE CHECK
Brady Statistic AP VP Percent: 80.03 %
Brady Statistic AP VS Percent: 0.79 %
Brady Statistic AS VP Percent: 17.62 %
Brady Statistic AS VS Percent: 1.56 %
Brady Statistic RA Percent Paced: 79.32 %
HIGH POWER IMPEDANCE MEASURED VALUE: 66 Ohm
HighPow Impedance: 50 Ohm
Implantable Lead Implant Date: 20100611
Implantable Lead Location: 753859
Implantable Lead Model: 4196
Implantable Lead Model: 6947
Implantable Pulse Generator Implant Date: 20160302
Lead Channel Impedance Value: 456 Ohm
Lead Channel Impedance Value: 456 Ohm
Lead Channel Impedance Value: 475 Ohm
Lead Channel Impedance Value: 532 Ohm
Lead Channel Impedance Value: 836 Ohm
Lead Channel Pacing Threshold Pulse Width: 0.4 ms
Lead Channel Pacing Threshold Pulse Width: 1 ms
Lead Channel Sensing Intrinsic Amplitude: 12.375 mV
Lead Channel Sensing Intrinsic Amplitude: 2.25 mV
Lead Channel Setting Pacing Amplitude: 1.5 V
Lead Channel Setting Pacing Amplitude: 1.5 V
Lead Channel Setting Pacing Pulse Width: 0.4 ms
Lead Channel Setting Pacing Pulse Width: 1 ms
MDC IDC LEAD IMPLANT DT: 20040315
MDC IDC LEAD IMPLANT DT: 20100611
MDC IDC LEAD LOCATION: 753858
MDC IDC LEAD LOCATION: 753860
MDC IDC MSMT BATTERY REMAINING LONGEVITY: 40 mo
MDC IDC MSMT BATTERY VOLTAGE: 2.96 V
MDC IDC MSMT LEADCHNL LV PACING THRESHOLD AMPLITUDE: 1 V
MDC IDC MSMT LEADCHNL RA IMPEDANCE VALUE: 513 Ohm
MDC IDC MSMT LEADCHNL RA PACING THRESHOLD AMPLITUDE: 0.75 V
MDC IDC MSMT LEADCHNL RA SENSING INTR AMPL: 2.125 mV
MDC IDC MSMT LEADCHNL RV PACING THRESHOLD AMPLITUDE: 0.5 V
MDC IDC MSMT LEADCHNL RV PACING THRESHOLD PULSEWIDTH: 0.4 ms
MDC IDC MSMT LEADCHNL RV SENSING INTR AMPL: 16 mV
MDC IDC SESS DTM: 20191015204747
MDC IDC SET LEADCHNL LV PACING AMPLITUDE: 1.5 V
MDC IDC SET LEADCHNL RV SENSING SENSITIVITY: 0.3 mV
MDC IDC STAT BRADY RV PERCENT PACED: 88.97 %

## 2018-08-14 ENCOUNTER — Other Ambulatory Visit: Payer: Self-pay | Admitting: Cardiovascular Disease

## 2018-09-05 ENCOUNTER — Ambulatory Visit (INDEPENDENT_AMBULATORY_CARE_PROVIDER_SITE_OTHER): Payer: Medicare Other

## 2018-09-05 DIAGNOSIS — I469 Cardiac arrest, cause unspecified: Secondary | ICD-10-CM

## 2018-09-05 DIAGNOSIS — I5022 Chronic systolic (congestive) heart failure: Secondary | ICD-10-CM

## 2018-09-05 NOTE — Progress Notes (Signed)
Remote ICD transmission.   

## 2018-09-06 LAB — CUP PACEART REMOTE DEVICE CHECK
Battery Remaining Longevity: 32 mo
Battery Voltage: 2.94 V
Brady Statistic AP VP Percent: 69.88 %
Brady Statistic AP VS Percent: 1.14 %
Brady Statistic AS VP Percent: 25.74 %
Brady Statistic RV Percent Paced: 80.08 %
Date Time Interrogation Session: 20191230140737
HighPow Impedance: 50 Ohm
HighPow Impedance: 65 Ohm
Implantable Lead Implant Date: 20100611
Implantable Lead Implant Date: 20100611
Implantable Lead Location: 753858
Implantable Lead Location: 753859
Implantable Lead Location: 753860
Implantable Lead Model: 4196
Implantable Lead Model: 5076
Implantable Lead Model: 6947
Lead Channel Impedance Value: 456 Ohm
Lead Channel Impedance Value: 760 Ohm
Lead Channel Pacing Threshold Amplitude: 0.625 V
Lead Channel Pacing Threshold Pulse Width: 0.4 ms
Lead Channel Pacing Threshold Pulse Width: 0.4 ms
Lead Channel Sensing Intrinsic Amplitude: 13.625 mV
Lead Channel Sensing Intrinsic Amplitude: 13.625 mV
Lead Channel Sensing Intrinsic Amplitude: 2.875 mV
Lead Channel Sensing Intrinsic Amplitude: 2.875 mV
Lead Channel Setting Pacing Amplitude: 1.5 V
Lead Channel Setting Pacing Amplitude: 1.5 V
Lead Channel Setting Pacing Pulse Width: 1 ms
MDC IDC LEAD IMPLANT DT: 20040315
MDC IDC MSMT LEADCHNL LV IMPEDANCE VALUE: 399 Ohm
MDC IDC MSMT LEADCHNL LV IMPEDANCE VALUE: 513 Ohm
MDC IDC MSMT LEADCHNL RV IMPEDANCE VALUE: 475 Ohm
MDC IDC MSMT LEADCHNL RV IMPEDANCE VALUE: 513 Ohm
MDC IDC MSMT LEADCHNL RV PACING THRESHOLD AMPLITUDE: 0.75 V
MDC IDC PG IMPLANT DT: 20160302
MDC IDC SET LEADCHNL RV PACING AMPLITUDE: 1.5 V
MDC IDC SET LEADCHNL RV PACING PULSEWIDTH: 0.4 ms
MDC IDC SET LEADCHNL RV SENSING SENSITIVITY: 0.3 mV
MDC IDC STAT BRADY AS VS PERCENT: 3.24 %
MDC IDC STAT BRADY RA PERCENT PACED: 68.35 %

## 2018-12-05 ENCOUNTER — Other Ambulatory Visit: Payer: Self-pay

## 2018-12-05 ENCOUNTER — Ambulatory Visit (INDEPENDENT_AMBULATORY_CARE_PROVIDER_SITE_OTHER): Payer: Medicare Other | Admitting: *Deleted

## 2018-12-05 DIAGNOSIS — I5022 Chronic systolic (congestive) heart failure: Secondary | ICD-10-CM

## 2018-12-05 DIAGNOSIS — I469 Cardiac arrest, cause unspecified: Secondary | ICD-10-CM

## 2018-12-05 LAB — CUP PACEART REMOTE DEVICE CHECK
Battery Remaining Longevity: 30 mo
Battery Voltage: 2.94 V
Brady Statistic AP VP Percent: 80.12 %
Brady Statistic AS VP Percent: 18.45 %
Brady Statistic RA Percent Paced: 80.87 %
HighPow Impedance: 49 Ohm
HighPow Impedance: 62 Ohm
Implantable Lead Implant Date: 20100611
Implantable Lead Location: 753858
Implantable Lead Location: 753859
Implantable Lead Model: 4196
Implantable Lead Model: 5076
Implantable Lead Model: 6947
Implantable Pulse Generator Implant Date: 20160302
Lead Channel Impedance Value: 399 Ohm
Lead Channel Impedance Value: 456 Ohm
Lead Channel Impedance Value: 456 Ohm
Lead Channel Sensing Intrinsic Amplitude: 13.625 mV
Lead Channel Sensing Intrinsic Amplitude: 13.625 mV
Lead Channel Setting Pacing Amplitude: 1.5 V
Lead Channel Setting Pacing Pulse Width: 0.4 ms
Lead Channel Setting Pacing Pulse Width: 1 ms
MDC IDC LEAD IMPLANT DT: 20040315
MDC IDC LEAD IMPLANT DT: 20100611
MDC IDC LEAD LOCATION: 753860
MDC IDC MSMT LEADCHNL LV IMPEDANCE VALUE: 532 Ohm
MDC IDC MSMT LEADCHNL LV IMPEDANCE VALUE: 874 Ohm
MDC IDC MSMT LEADCHNL RA IMPEDANCE VALUE: 475 Ohm
MDC IDC MSMT LEADCHNL RA PACING THRESHOLD AMPLITUDE: 0.625 V
MDC IDC MSMT LEADCHNL RA PACING THRESHOLD PULSEWIDTH: 0.4 ms
MDC IDC MSMT LEADCHNL RA SENSING INTR AMPL: 2.25 mV
MDC IDC MSMT LEADCHNL RA SENSING INTR AMPL: 2.25 mV
MDC IDC MSMT LEADCHNL RV PACING THRESHOLD AMPLITUDE: 0.75 V
MDC IDC MSMT LEADCHNL RV PACING THRESHOLD PULSEWIDTH: 0.4 ms
MDC IDC SESS DTM: 20200330112711
MDC IDC SET LEADCHNL RA PACING AMPLITUDE: 1.5 V
MDC IDC SET LEADCHNL RV PACING AMPLITUDE: 1.5 V
MDC IDC SET LEADCHNL RV SENSING SENSITIVITY: 0.3 mV
MDC IDC STAT BRADY AP VS PERCENT: 1.07 %
MDC IDC STAT BRADY AS VS PERCENT: 0.36 %
MDC IDC STAT BRADY RV PERCENT PACED: 89.02 %

## 2018-12-13 NOTE — Progress Notes (Signed)
Remote ICD transmission.   

## 2019-01-24 ENCOUNTER — Encounter (HOSPITAL_COMMUNITY): Payer: Self-pay | Admitting: Emergency Medicine

## 2019-01-24 ENCOUNTER — Emergency Department (HOSPITAL_COMMUNITY)
Admission: EM | Admit: 2019-01-24 | Discharge: 2019-01-24 | Disposition: A | Discharge status: Home / Self Care | Payer: Medicare Other | Attending: Emergency Medicine | Admitting: Emergency Medicine

## 2019-01-24 ENCOUNTER — Other Ambulatory Visit: Payer: Self-pay

## 2019-01-24 ENCOUNTER — Emergency Department (HOSPITAL_COMMUNITY): Payer: Medicare Other

## 2019-01-24 DIAGNOSIS — Z95810 Presence of automatic (implantable) cardiac defibrillator: Secondary | ICD-10-CM | POA: Insufficient documentation

## 2019-01-24 DIAGNOSIS — R110 Nausea: Secondary | ICD-10-CM | POA: Insufficient documentation

## 2019-01-24 DIAGNOSIS — Z955 Presence of coronary angioplasty implant and graft: Secondary | ICD-10-CM | POA: Diagnosis not present

## 2019-01-24 DIAGNOSIS — I130 Hypertensive heart and chronic kidney disease with heart failure and stage 1 through stage 4 chronic kidney disease, or unspecified chronic kidney disease: Secondary | ICD-10-CM | POA: Diagnosis not present

## 2019-01-24 DIAGNOSIS — N189 Chronic kidney disease, unspecified: Secondary | ICD-10-CM | POA: Diagnosis not present

## 2019-01-24 DIAGNOSIS — I5022 Chronic systolic (congestive) heart failure: Secondary | ICD-10-CM | POA: Insufficient documentation

## 2019-01-24 DIAGNOSIS — K567 Ileus, unspecified: Secondary | ICD-10-CM

## 2019-01-24 DIAGNOSIS — R1084 Generalized abdominal pain: Secondary | ICD-10-CM | POA: Insufficient documentation

## 2019-01-24 DIAGNOSIS — K5900 Constipation, unspecified: Secondary | ICD-10-CM | POA: Diagnosis not present

## 2019-01-24 DIAGNOSIS — R1033 Periumbilical pain: Secondary | ICD-10-CM | POA: Diagnosis present

## 2019-01-24 LAB — CBC WITH DIFFERENTIAL/PLATELET
Abs Immature Granulocytes: 0.03 10*3/uL (ref 0.00–0.07)
Basophils Absolute: 0 10*3/uL (ref 0.0–0.1)
Basophils Relative: 0 %
Eosinophils Absolute: 0.1 10*3/uL (ref 0.0–0.5)
Eosinophils Relative: 1 %
HCT: 44 % (ref 39.0–52.0)
Hemoglobin: 14.6 g/dL (ref 13.0–17.0)
Immature Granulocytes: 0 %
Lymphocytes Relative: 11 %
Lymphs Abs: 1 10*3/uL (ref 0.7–4.0)
MCH: 31.5 pg (ref 26.0–34.0)
MCHC: 33.2 g/dL (ref 30.0–36.0)
MCV: 94.8 fL (ref 80.0–100.0)
Monocytes Absolute: 0.8 10*3/uL (ref 0.1–1.0)
Monocytes Relative: 9 %
Neutro Abs: 7.1 10*3/uL (ref 1.7–7.7)
Neutrophils Relative %: 79 %
Platelets: 223 10*3/uL (ref 150–400)
RBC: 4.64 MIL/uL (ref 4.22–5.81)
RDW: 12.4 % (ref 11.5–15.5)
WBC: 9.1 10*3/uL (ref 4.0–10.5)
nRBC: 0 % (ref 0.0–0.2)

## 2019-01-24 LAB — COMPREHENSIVE METABOLIC PANEL
ALT: 17 U/L (ref 0–44)
AST: 21 U/L (ref 15–41)
Albumin: 3.5 g/dL (ref 3.5–5.0)
Alkaline Phosphatase: 54 U/L (ref 38–126)
Anion gap: 8 (ref 5–15)
BUN: 14 mg/dL (ref 8–23)
CO2: 26 mmol/L (ref 22–32)
Calcium: 9.6 mg/dL (ref 8.9–10.3)
Chloride: 104 mmol/L (ref 98–111)
Creatinine, Ser: 0.94 mg/dL (ref 0.61–1.24)
GFR calc Af Amer: >60 mL/min (ref >60)
GFR calc non Af Amer: >60 mL/min (ref >60)
Glucose, Bld: 110 mg/dL — ABNORMAL HIGH (ref 70–99)
Potassium: 4.3 mmol/L (ref 3.5–5.1)
Sodium: 138 mmol/L (ref 135–145)
Total Bilirubin: 1 mg/dL (ref 0.3–1.2)
Total Protein: 6.4 g/dL — ABNORMAL LOW (ref 6.5–8.1)

## 2019-01-24 LAB — URINALYSIS, ROUTINE W REFLEX MICROSCOPIC
Bilirubin Urine: NEGATIVE
Glucose, UA: NEGATIVE mg/dL
Hgb urine dipstick: NEGATIVE
Ketones, ur: NEGATIVE mg/dL
Leukocytes,Ua: NEGATIVE
Nitrite: NEGATIVE
Protein, ur: NEGATIVE mg/dL
Specific Gravity, Urine: >1.046 — ABNORMAL HIGH (ref 1.005–1.030)
pH: 6 (ref 5.0–8.0)

## 2019-01-24 LAB — LIPASE, BLOOD: Lipase: 31 U/L (ref 11–51)

## 2019-01-24 MED ORDER — DICYCLOMINE HCL 10 MG PO CAPS
10.0000 mg | ORAL_CAPSULE | Freq: Once | ORAL | Status: AC
  Administered 2019-01-24: 15:00:00 10 mg via ORAL
  Filled 2019-01-24: qty 1

## 2019-01-24 MED ORDER — IOHEXOL 300 MG/ML  SOLN
100.0000 mL | Freq: Once | INTRAMUSCULAR | Status: AC | PRN
  Administered 2019-01-24: 100 mL via INTRAVENOUS

## 2019-01-24 MED ORDER — DICYCLOMINE HCL 20 MG PO TABS: 20.0000 mg | ORAL_TABLET | Freq: Three times a day (TID) | ORAL | 0 refills | Status: DC | PRN

## 2019-01-24 MED ORDER — ONDANSETRON 4 MG PO TBDP: 4.0000 mg | ORAL_TABLET | Freq: Three times a day (TID) | ORAL | 0 refills | Status: DC | PRN

## 2019-01-24 NOTE — ED Notes (Signed)
ED Provider at bedside. 

## 2019-01-24 NOTE — ED Notes (Signed)
Patient transported to CT 

## 2019-01-24 NOTE — ED Triage Notes (Signed)
Pt reports abd pain that started suddenly last night right above and around his umbilicus. Pt states he felt nausea and vomiting "at least twice". Denies any diarrhea reports normal BMs.

## 2019-01-24 NOTE — Discharge Instructions (Signed)
You were seen in the emergency department today with abdominal pain symptoms.  You were found to have an ileus.  I attached information regarding this to your discharge paperwork.  Use the medications as needed for symptom management.  Try to adhere to a mainly liquid diet today and tomorrow.  After that you can advance her diet slowly with dry toast and bland foods.  You can continue to advance her diet as tolerated.  Return to the emergency department with any sudden worsening abdominal pain, fevers, chest pain, shortness of breath, or other new/suddenly worsening symptoms.

## 2019-01-24 NOTE — ED Provider Notes (Signed)
Emergency Department Provider Note   I have reviewed the triage vital signs and the nursing notes.   HISTORY  Chief Complaint Abdominal Pain   HPI [name] is a 78 y.o. male with PMH of CHF, HLD, PAF on Eliquis, and prior surgical history of appendectomy and prostatectomy presents to the emergency department for evaluation of periumbilical abdominal pain which is gradually worsened over the past 2 days.  Patient is having associated nausea and some constipation starting today.  He denies any diarrhea or vomiting.  He felt too nauseated this morning to take any of his medications.  He has not experienced any fevers, chills, chest pain, shortness of breath.  He saw his PCP today who referred him to the emergency department for evaluation.  No prior history of bowel obstruction.  His appendectomy was in the 1980s.  He remains compliant with his home medications and has no other symptoms.  No radiation of pain symptoms.  Does describe some burning with defecation over the past several days but states that is mostly resolved.  Past Medical History:  Diagnosis Date  . Arthritis    knees, back   . BPH (benign prostatic hypertrophy)   . Chronic kidney disease    BPH  . Chronic systolic heart failure (HCC)    a. s/p MDT single chamber ICD 2004 as part of MASTER study b. upgrade to CRTD 2010; CRTD gen change 2016  . Coronary artery disease    a. s/p anterior MI and CYPHER stent to LAD 2005  . Dyslipidemia   . Gout   . Ischemic cardiomyopathy   . Paroxysmal atrial fibrillation (HCC)   . Ventricular tachycardia [facility])     Patient Active Problem List   Diagnosis Date Noted  . Ischemic cardiomyopathy 01/19/2013  . ICD (implantable cardiac defibrillator), biventricular, .medtronic 01/28/2012  . Anemia 11/06/2011  . Chronic systolic CHF (congestive heart failure) (HCC)   . Coronary artery disease   . Dyslipidemia   . Gout   . Ariellah Faust term (current) use of anticoagulants 12/18/2010  .  Atrial fibrillation (HCC) 02/25/2010  . ESSENTIAL HYPERTENSION, BENIGN 01/14/2009    Past Surgical History:  Procedure Laterality Date  . APPENDECTOMY    . BI-VENTRICULAR IMPLANTABLE CARDIOVERTER DEFIBRILLATOR UPGRADE N/A 11/07/2014   a. MDT single chamber ICD implanted 2004 as part of MASTER study; upgrade to CRTD 2010; gen change 2016  . CARDIAC CATHETERIZATION  2005  . CORONARY ANGIOPLASTY     5 stents   . KNEE ARTHROSCOPY    . OTHER SURGICAL HISTORY     right ear surgery as a child   . PROSTATECTOMY  11/06/2011   Procedure: PROSTATECTOMY SUPRAPUBIC;  Surgeon: Kathi Ludwig, MD;  Location: [facility];  Service: Urology;  Laterality: N/A;  Open Suprapubic Prostatectomy    Allergies Atorvastatin  Family History  Problem Relation Age of Onset  . Heart disease Sister        3 54f the 4 had HD  . Heart disease Brother        2 of 3 had HD  . Heart disease Sister        1 of the 6 has HD  . Heart disease Brother     Social History Social History   Tobacco Use  . Smoking status: Never Smoker  . Smokeless tobacco: Never Used  Substance Use Topics  . Alcohol use: No    Comment: occasional beer   . Drug use: No  Review of Systems  Constitutional: No fever/chills Eyes: No visual changes. ENT: No sore throat. Cardiovascular: Denies chest pain. Respiratory: Denies shortness of breath. Gastrointestinal: Positive abdominal pain.  Positive nausea, no vomiting.  No diarrhea. Positive constipation. Genitourinary: Negative for dysuria. Musculoskeletal: Negative for back pain. Skin: Negative for rash. Neurological: Negative for headaches, focal weakness or numbness.  10-point ROS otherwise negative.  ____________________________________________   PHYSICAL EXAM:  VITAL SIGNS: ED Triage Vitals  Enc Vitals Group     BP 01/24/19 1108 122/72     Pulse Rate 01/24/19 1108 84     Resp 01/24/19 1108 16     Temp 01/24/19 1108 98.8 F (37.1 C)     Temp Source 01/24/19  1108 Oral     SpO2 01/24/19 1108 96 %     Pain Score 01/24/19 1133 4   Constitutional: Alert and oriented. Well appearing and in no acute distress. Eyes: Conjunctivae are normal. Head: Atraumatic. Nose: No congestion/rhinnorhea. Mouth/Throat: Mucous membranes are moist.   Neck: No stridor.  Cardiovascular: Normal rate, regular rhythm. Good peripheral circulation. Grossly normal heart sounds.   Respiratory: Normal respiratory effort.  No retractions. Lungs CTAB. Gastrointestinal: Diffuse tenderness to palpation of the abdomen. No rebound or guarding. Mild distention.  Musculoskeletal: No lower extremity tenderness nor edema. No gross deformities of extremities. Neurologic:  Normal speech and language. No gross focal neurologic deficits are appreciated.  Skin:  Skin is warm, dry and intact. No rash noted.  ____________________________________________   LABS (all labs ordered are listed, but only abnormal results are displayed)  Labs Reviewed  COMPREHENSIVE METABOLIC PANEL - Abnormal; Notable for the following components:      Result Value   Glucose, Bld 110 (*)    Total Protein 6.4 (*)    All other components within normal limits  URINALYSIS, ROUTINE W REFLEX MICROSCOPIC - Abnormal; Notable for the following components:   Specific Gravity, Urine >1.046 (*)    All other components within normal limits  LIPASE, BLOOD  CBC WITH DIFFERENTIAL/PLATELET   ____________________________________________  RADIOLOGY  Ct Abdomen Pelvis W Contrast  Result Date: 01/24/2019 CLINICAL DATA:  Bilateral lower abdominal pain with emesis. EXAM: CT ABDOMEN AND PELVIS WITH CONTRAST TECHNIQUE: Multidetector CT imaging of the abdomen and pelvis was performed using the standard protocol following bolus administration of intravenous contrast. CONTRAST:  OMNIPAQUE IOHEXOL 300 MG/ML  SOLN COMPARISON:  None. FINDINGS: Lower chest: The heart is enlarged. Coronary artery calcifications and probable  coronary artery stents are noted. Hepatobiliary: No focal liver abnormality is seen. No gallstones, gallbladder wall thickening, or biliary dilatation. Pancreas: Unremarkable. No pancreatic ductal dilatation or surrounding inflammatory changes. Spleen: Normal in size without focal abnormality. Adrenals/Urinary Tract: The right adrenal gland is unremarkable. There is a small 1.1 cm fat containing lesion in the left adrenal gland likely representing a benign lipid rich adenoma or myelolipoma. 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 There is no right-sided hydronephrosis. No radiopaque obstructing kidney stones. The left renal collecting system may be partially duplicated proximally. The bladder is unremarkable. Stomach/Bowel: There is diffuse wall thickening of the distal esophagus and stomach near the GE junction. An air-fluid level is noted in the stomach. A duodenal diverticulum is noted. There are mildly dilated loops of small bowel throughout the abdomen with scattered air-fluid levels. A distinct transition point is not noted. There is liquid stool in the colon. There are scattered colonic diverticula without CT evidence of diverticulitis. The appendix is not  reliably identified, however there are no inflammatory changes in the right lower quadrant. Vascular/Lymphatic: Aortic atherosclerosis. No enlarged abdominal or pelvic lymph nodes. Reproductive: The patient appears to be status post TURP. Other: There is bilateral gynecomastia. Musculoskeletal: No acute or significant osseous findings. IMPRESSION: 1. Mildly dilated loops of small bowel scattered throughout the abdomen with associated air-fluid levels is most consistent with an ileus or enteritis in the absence of a distinct transition point. Liquid stool is noted in the colon consistent with diarrhea. 2. Distended stomach with an air-fluid level and wall thickening of the distal esophagus and GE junction may be  secondary to gastritis or esophagitis. Clinical correlation is recommended. 3. Cardiomegaly. 4. Bilateral gynecomastia. Electronically Signed   By: Katherine Mantle M.D.   On: 01/24/2019 14:21    ____________________________________________   PROCEDURES  Procedure(s) performed:   Procedures   ____________________________________________   INITIAL IMPRESSION / ASSESSMENT AND PLAN / ED COURSE  Pertinent labs & imaging results that were available during my care of the patient were reviewed by me and considered in my medical decision making (see chart for details).   Patient presents to the emergency department with diffuse abdominal discomfort with nausea and constipation starting this morning.  He has diffuse tenderness on exam but no peritoneal findings.  My suspicion for bowel obstruction is elevated.  Doubt vascular etiology to his pain symptoms such as abdominal aortic aneurysm.  Patient has history of appendectomy.  Also considering possible complicated diverticulitis.  Will obtain CT imaging along with screening labs.  Patient does not feel like he needs pain or nausea medication at this time.  No radiation of symptoms to the chest or indication to suspect atypical ACS.  02:31 PM  CT imaging reviewed.  Patient with evidence of an ileus without transition point or clear evidence of partial small bowel obstruction.  No other acute findings.  Possible viral etiology.  Discussed symptom management here and PO challenge to help assist with disposition.  If patient is feeling well, plan for liquid diet over the next 1 to 2 days with slow progression to more solid foods.  Patient in agreement with this plan.  Reassess after PO challenge.   03:05 PM  Patient is feeling improved after Bentyl.  He is drinking gentle fluids without significant discomfort, nausea, vomiting.  Plan to do a liquid diet at home and slowly advance as tolerated over the next several days.  Discussed ED return  precautions in detail.  Possible viral type etiology causing symptoms.  Patient verbalizes understanding of return precautions along with follow-up plan.  Provided contact information for gastroenterology as well for outpatient follow up in the next 2 weeks.  _________________________________________  FINAL CLINICAL IMPRESSION(S) / ED DIAGNOSES  Final diagnoses:  Generalized abdominal pain  Ileus (HCC)     MEDICATIONS GIVEN DURING THIS VISIT:  Medications  iohexol (OMNIPAQUE) 300 MG/ML solution 100 mL (100 mLs Intravenous Contrast Given 01/24/19 1323)  dicyclomine (BENTYL) capsule 10 mg (10 mg Oral Given 01/24/19 1452)     NEW OUTPATIENT MEDICATIONS STARTED DURING THIS VISIT:  New Prescriptions   DICYCLOMINE (BENTYL) 20 MG TABLET    Take 1 tablet (20 mg total) by mouth 3 (three) times daily as needed for spasms (abdominal cramping).   ONDANSETRON (ZOFRAN ODT) 4 MG DISINTEGRATING TABLET    Take 1 tablet (4 mg total) by mouth every 8 (eight) hours as needed for nausea or vomiting.    Note:  This document was prepared using Dragon voice  recognition software and may include unintentional dictation errors.  Alona BeneJoshua Gerilynn Mccullars, MD Emergency Medicine    Travonne Schowalter, Arlyss RepressJoshua G, MD 01/24/19 [phone]

## 2019-01-24 NOTE — ED Notes (Signed)
Pt ambulated to RR .  

## 2019-01-24 NOTE — ED Notes (Signed)
Nurse Navigator communication: The patient is being discharged at this time with no need for calls.

## 2019-01-30 ENCOUNTER — Other Ambulatory Visit: Payer: Self-pay | Admitting: Cardiovascular Disease

## 2019-03-06 ENCOUNTER — Encounter: Payer: Medicare Other | Admitting: *Deleted

## 2019-03-06 ENCOUNTER — Telehealth: Payer: Self-pay

## 2019-03-06 NOTE — Telephone Encounter (Signed)
Pt received error code 3230. I gave him the number to [employer] support to get additional help with his monitor.

## 2019-03-07 ENCOUNTER — Telehealth: Payer: Self-pay

## 2019-03-07 NOTE — Telephone Encounter (Signed)
Left message for patient to remind of missed remote transmission.  

## 2019-03-08 ENCOUNTER — Telehealth: Payer: Self-pay

## 2019-03-08 NOTE — Telephone Encounter (Signed)
Pt called [employer] tech support and they are sending him a new monitor.

## 2019-03-13 ENCOUNTER — Ambulatory Visit (INDEPENDENT_AMBULATORY_CARE_PROVIDER_SITE_OTHER): Payer: Medicare Other | Admitting: *Deleted

## 2019-03-13 DIAGNOSIS — I469 Cardiac arrest, cause unspecified: Secondary | ICD-10-CM | POA: Diagnosis not present

## 2019-03-14 LAB — CUP PACEART REMOTE DEVICE CHECK
Battery Remaining Longevity: 26 mo
Battery Voltage: 2.92 V
Brady Statistic AP VP Percent: 77.91 %
Brady Statistic AP VS Percent: 0.98 %
Brady Statistic AS VP Percent: 19.43 %
Brady Statistic AS VS Percent: 1.68 %
Brady Statistic RA Percent Paced: 77.33 %
Brady Statistic RV Percent Paced: 87.71 %
Date Time Interrogation Session: 20200703194815
HighPow Impedance: 48 Ohm
HighPow Impedance: 65 Ohm
Implantable Lead Implant Date: 20040315
Implantable Lead Implant Date: 20100611
Implantable Lead Implant Date: 20100611
Implantable Lead Location: 753858
Implantable Lead Location: 753859
Implantable Lead Location: 753860
Implantable Lead Model: 4196
Implantable Lead Model: 5076
Implantable Lead Model: 6947
Implantable Pulse Generator Implant Date: 20160302
Lead Channel Impedance Value: 399 Ohm
Lead Channel Impedance Value: 418 Ohm
Lead Channel Impedance Value: 475 Ohm
Lead Channel Impedance Value: 513 Ohm
Lead Channel Impedance Value: 513 Ohm
Lead Channel Impedance Value: 817 Ohm
Lead Channel Pacing Threshold Amplitude: 0.625 V
Lead Channel Pacing Threshold Amplitude: 0.75 V
Lead Channel Pacing Threshold Pulse Width: 0.4 ms
Lead Channel Pacing Threshold Pulse Width: 0.4 ms
Lead Channel Sensing Intrinsic Amplitude: 2.375 mV
Lead Channel Sensing Intrinsic Amplitude: 2.375 mV
Lead Channel Sensing Intrinsic Amplitude: 20.5 mV
Lead Channel Sensing Intrinsic Amplitude: 20.5 mV
Lead Channel Setting Pacing Amplitude: 1.5 V
Lead Channel Setting Pacing Amplitude: 1.5 V
Lead Channel Setting Pacing Amplitude: 1.5 V
Lead Channel Setting Pacing Pulse Width: 0.4 ms
Lead Channel Setting Pacing Pulse Width: 1 ms
Lead Channel Setting Sensing Sensitivity: 0.3 mV

## 2019-03-21 ENCOUNTER — Encounter: Payer: Self-pay | Admitting: Cardiology

## 2019-03-21 NOTE — Progress Notes (Signed)
Remote ICD transmission.   

## 2019-03-25 ENCOUNTER — Other Ambulatory Visit: Payer: Self-pay | Admitting: Cardiovascular Disease

## 2019-03-27 NOTE — Telephone Encounter (Signed)
Eliquis 5mg  refill request received; pt is 78 yrs old, wt-96kg, Crea-0.94 on 01/24/2019, last seen by Dr. Caryl Comes on 06/21/2018, Diagnosis Afib; will send in refill to requested pharmacy.

## 2019-05-04 ENCOUNTER — Other Ambulatory Visit: Payer: Self-pay

## 2019-05-04 ENCOUNTER — Encounter: Payer: Self-pay | Admitting: Cardiovascular Disease

## 2019-05-04 ENCOUNTER — Ambulatory Visit (INDEPENDENT_AMBULATORY_CARE_PROVIDER_SITE_OTHER): Payer: Medicare Other | Admitting: Cardiovascular Disease

## 2019-05-04 ENCOUNTER — Encounter (INDEPENDENT_AMBULATORY_CARE_PROVIDER_SITE_OTHER): Payer: Self-pay

## 2019-05-04 VITALS — BP 104/66 | HR 89 | Ht 71.0 in | Wt 214.2 lb

## 2019-05-04 DIAGNOSIS — I2510 Atherosclerotic heart disease of native coronary artery without angina pectoris: Secondary | ICD-10-CM

## 2019-05-04 DIAGNOSIS — R5383 Other fatigue: Secondary | ICD-10-CM | POA: Diagnosis not present

## 2019-05-04 DIAGNOSIS — I5022 Chronic systolic (congestive) heart failure: Secondary | ICD-10-CM

## 2019-05-04 DIAGNOSIS — I480 Paroxysmal atrial fibrillation: Secondary | ICD-10-CM

## 2019-05-04 LAB — TSH: TSH: 2.87 u[IU]/mL (ref 0.450–4.500)

## 2019-05-04 NOTE — Patient Instructions (Signed)
Medication Instructions:  Your physician recommends that you continue on your current medications as directed. Please refer to the Current Medication list given to you today.  **YOU may hold your carvedilol (Coreg) for one week to see if your fatigue (tiredness)  Improves  If you need a refill on your cardiac medications before your next appointment, please call your pharmacy.   Lab work: TODAY - TSH  Your physician recommends that you return for lab work in: 6 months on the day of or a few days before your office visit with Dr. Acie Fredrickson.  You will need to FAST for this appointment - nothing to eat or drink after midnight the night before except water.   If you have labs (blood work) drawn today and your tests are completely normal, you will receive your results only by: [name] MyChart Message (if you have MyChart) OR . A paper copy in the mail If you have any lab test that is abnormal or we need to change your treatment, we will call you to review the results.  Testing/Procedures: Your physician has requested that you have an echocardiogram. Echocardiography is a painless test that uses sound waves to create images of your heart. It provides your doctor with information about the size and shape of your heart and how well your heart's chambers and valves are working. This procedure takes approximately one hour. There are no restrictions for this procedure.    Follow-Up: At [facility], you and your health needs are our priority.  As part of our continuing mission to provide you with exceptional heart care, we have created designated Provider Care Teams.  These Care Teams include your primary Cardiologist (physician) and Advanced Practice Providers (APPs -  Physician Assistants and Nurse Practitioners) who all work together to provide you with the care you need, when you need it. You will need a follow up appointment in:  6 months.  Please call our office 2 months in advance to schedule this  appointment.  You may see Mertie Moores, MD or one of the following Advanced Practice Providers on your designated Care Team: Richardson Dopp, PA-C [place], [place] . Daune Perch, NP
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
## 2019-05-10 ENCOUNTER — Telehealth: Payer: Self-pay | Admitting: *Deleted

## 2019-05-10 ENCOUNTER — Other Ambulatory Visit: Payer: Self-pay

## 2019-05-10 ENCOUNTER — Ambulatory Visit (HOSPITAL_COMMUNITY): Payer: Medicare Other | Attending: Cardiovascular Disease

## 2019-05-10 ENCOUNTER — Encounter: Payer: Self-pay | Admitting: *Deleted

## 2019-05-10 DIAGNOSIS — I5022 Chronic systolic (congestive) heart failure: Secondary | ICD-10-CM | POA: Insufficient documentation

## 2019-05-10 DIAGNOSIS — I2510 Atherosclerotic heart disease of native coronary artery without angina pectoris: Secondary | ICD-10-CM

## 2019-05-10 DIAGNOSIS — R5383 Other fatigue: Secondary | ICD-10-CM | POA: Diagnosis present

## 2019-05-10 DIAGNOSIS — Z01812 Encounter for preprocedural laboratory examination: Secondary | ICD-10-CM

## 2019-05-10 MED ORDER — ENTRESTO 24-26 MG PO TABS: 1.0000 | ORAL_TABLET | Freq: Two times a day (BID) | ORAL | 11 refills | Status: DC

## 2019-05-10 MED ORDER — PERFLUTREN LIPID MICROSPHERE
1.0000 mL | INTRAVENOUS | Status: AC | PRN
  Administered 2019-05-10: 2 mL via INTRAVENOUS

## 2019-05-10 NOTE — Telephone Encounter (Signed)
-----   Message from James Headings, MD sent at 05/10/2019  1:01 PM EDT ----- His EF has fallen from 35% to 25%. BP has been low. He's had lots of fatigue. DC coreg. DC Lisinopril ( took last dose last night)  Start Entresto 24-26 mg BiD starting tomorrow am.   This will be > 36 hours after his last dose of Lisinopril  Nurse visit , BMP in 2-3 weeks. Office visit with me or APP in 6-8 weeks  Please see if there are samples of the Entresto to start him out and please call him to inform him of whether or not we can provide samples

## 2019-05-10 NOTE — Telephone Encounter (Signed)
Patient has been  -scheduled for covid testing for 9/3 at 2:45 pm -scheduled for TEE with Dr. Audie Box on 05/16/19 at 9:00 am -instruction letter printed to be picked up when comes for lab -lab scheduled for 05/11/19, will come prior to covid testing -dc'd coreg and lisinopril-pt took last dose of lisinopril 9/1 in evening -entered order for Entresto 24-26 mg by mouth twice daily to start Thursday am -using Entresto 30 day free trial card -scheduled nurse visit and BMET for 05/30/19, and -scheduled f/u with Dr. Acie Fredrickson 06/22/19.

## 2019-05-10 NOTE — Telephone Encounter (Signed)
Nahser, James Cheng, MD  O'Neal, Cassie Freer, MD; Rodman Key, RN        Agree with your concern. Certainly could be a vegetation  He has not felt well.  Will schedule him for a TEE  Thanks

## 2019-05-11 ENCOUNTER — Other Ambulatory Visit: Payer: Medicare Other

## 2019-05-11 ENCOUNTER — Inpatient Hospital Stay (HOSPITAL_COMMUNITY): Admission: RE | Admit: 2019-05-11 | Payer: Medicare Other | Source: Ambulatory Visit

## 2019-05-12 ENCOUNTER — Other Ambulatory Visit: Payer: Self-pay

## 2019-05-12 ENCOUNTER — Other Ambulatory Visit: Payer: Medicare Other | Admitting: *Deleted

## 2019-05-12 ENCOUNTER — Other Ambulatory Visit: Payer: Medicare Other

## 2019-05-12 ENCOUNTER — Other Ambulatory Visit (HOSPITAL_COMMUNITY): Payer: Medicare Other

## 2019-05-12 ENCOUNTER — Other Ambulatory Visit (HOSPITAL_COMMUNITY)
Admission: RE | Admit: 2019-05-12 | Discharge: 2019-05-12 | Disposition: A | Discharge status: Home / Self Care | Payer: Medicare Other | Source: Ambulatory Visit | Attending: Cardiovascular Disease | Admitting: Cardiovascular Disease

## 2019-05-12 DIAGNOSIS — I2510 Atherosclerotic heart disease of native coronary artery without angina pectoris: Secondary | ICD-10-CM

## 2019-05-12 DIAGNOSIS — Z01812 Encounter for preprocedural laboratory examination: Secondary | ICD-10-CM | POA: Diagnosis present

## 2019-05-12 DIAGNOSIS — Z20828 Contact with and (suspected) exposure to other viral communicable diseases: Secondary | ICD-10-CM | POA: Insufficient documentation

## 2019-05-12 DIAGNOSIS — I5022 Chronic systolic (congestive) heart failure: Secondary | ICD-10-CM

## 2019-05-12 DIAGNOSIS — R5383 Other fatigue: Secondary | ICD-10-CM

## 2019-05-12 LAB — BASIC METABOLIC PANEL
BUN/Creatinine Ratio: 19 (ref 10–24)
BUN: 17 mg/dL (ref 8–27)
CO2: 20 mmol/L (ref 20–29)
Calcium: 8.9 mg/dL (ref 8.6–10.2)
Chloride: 103 mmol/L (ref 96–106)
Creatinine, Ser: 0.91 mg/dL (ref 0.76–1.27)
GFR calc Af Amer: 92 mL/min/{1.73_m2} (ref >59)
GFR calc non Af Amer: 80 mL/min/{1.73_m2} (ref >59)
Glucose: 115 mg/dL — ABNORMAL HIGH (ref 65–99)
Potassium: 4.5 mmol/L (ref 3.5–5.2)
Sodium: 139 mmol/L (ref 134–144)

## 2019-05-12 LAB — LIPID PANEL
Chol/HDL Ratio: 3.6 ratio (ref 0.0–5.0)
Cholesterol, Total: 116 mg/dL (ref 100–199)
HDL: 32 mg/dL — ABNORMAL LOW (ref >39)
LDL Chol Calc (NIH): 62 mg/dL (ref 0–99)
Triglycerides: 122 mg/dL (ref 0–149)
VLDL Cholesterol Cal: 22 mg/dL (ref 5–40)

## 2019-05-12 LAB — HEPATIC FUNCTION PANEL
ALT: 12 IU/L (ref 0–44)
AST: 16 IU/L (ref 0–40)
Albumin: 4 g/dL (ref 3.7–4.7)
Alkaline Phosphatase: 65 IU/L (ref 39–117)
Bilirubin Total: 0.5 mg/dL (ref 0.0–1.2)
Bilirubin, Direct: 0.17 mg/dL (ref 0.00–0.40)
Total Protein: 6.5 g/dL (ref 6.0–8.5)

## 2019-05-12 NOTE — Progress Notes (Signed)
This RN spoke with pts daughter, Nira Conn, she states the pt is hard of hearing and cant hear on the phone. She is aware of visitor policy. She states pt will remain quarantined until procedure on 9/9. No symptoms of fevers, shortness of breath or cough.

## 2019-05-13 ENCOUNTER — Other Ambulatory Visit: Payer: Self-pay | Admitting: Cardiovascular Disease

## 2019-05-14 LAB — NOVEL CORONAVIRUS, NAA (HOSP ORDER, SEND-OUT TO REF LAB; TAT 18-24 HRS): SARS-CoV-2, NAA: NOT DETECTED

## 2019-05-16 ENCOUNTER — Encounter (HOSPITAL_COMMUNITY)
Admission: RE | Disposition: A | Discharge status: Home / Self Care | Payer: Self-pay | Source: Home / Self Care | Attending: Cardiovascular Disease

## 2019-05-16 ENCOUNTER — Ambulatory Visit (HOSPITAL_COMMUNITY)
Admission: RE | Admit: 2019-05-16 | Discharge: 2019-05-16 | Disposition: A | Discharge status: Home / Self Care | Payer: Medicare Other | Attending: Cardiovascular Disease | Admitting: Cardiovascular Disease

## 2019-05-16 ENCOUNTER — Ambulatory Visit (HOSPITAL_BASED_OUTPATIENT_CLINIC_OR_DEPARTMENT_OTHER): Payer: Medicare Other

## 2019-05-16 ENCOUNTER — Other Ambulatory Visit: Payer: Self-pay

## 2019-05-16 ENCOUNTER — Encounter (HOSPITAL_COMMUNITY): Payer: Self-pay | Admitting: Emergency Medicine

## 2019-05-16 DIAGNOSIS — N189 Chronic kidney disease, unspecified: Secondary | ICD-10-CM | POA: Insufficient documentation

## 2019-05-16 DIAGNOSIS — Z95810 Presence of automatic (implantable) cardiac defibrillator: Secondary | ICD-10-CM | POA: Diagnosis not present

## 2019-05-16 DIAGNOSIS — M109 Gout, unspecified: Secondary | ICD-10-CM | POA: Diagnosis not present

## 2019-05-16 DIAGNOSIS — I2510 Atherosclerotic heart disease of native coronary artery without angina pectoris: Secondary | ICD-10-CM | POA: Insufficient documentation

## 2019-05-16 DIAGNOSIS — I472 Ventricular tachycardia: Secondary | ICD-10-CM | POA: Diagnosis not present

## 2019-05-16 DIAGNOSIS — Z7901 Long term (current) use of anticoagulants: Secondary | ICD-10-CM | POA: Insufficient documentation

## 2019-05-16 DIAGNOSIS — Z79899 Other long term (current) drug therapy: Secondary | ICD-10-CM | POA: Insufficient documentation

## 2019-05-16 DIAGNOSIS — I5022 Chronic systolic (congestive) heart failure: Secondary | ICD-10-CM | POA: Diagnosis not present

## 2019-05-16 DIAGNOSIS — Z8249 Family history of ischemic heart disease and other diseases of the circulatory system: Secondary | ICD-10-CM | POA: Diagnosis not present

## 2019-05-16 DIAGNOSIS — I480 Paroxysmal atrial fibrillation: Secondary | ICD-10-CM | POA: Diagnosis not present

## 2019-05-16 DIAGNOSIS — Z955 Presence of coronary angioplasty implant and graft: Secondary | ICD-10-CM | POA: Insufficient documentation

## 2019-05-16 DIAGNOSIS — E785 Hyperlipidemia, unspecified: Secondary | ICD-10-CM | POA: Insufficient documentation

## 2019-05-16 DIAGNOSIS — M1990 Unspecified osteoarthritis, unspecified site: Secondary | ICD-10-CM | POA: Insufficient documentation

## 2019-05-16 DIAGNOSIS — I252 Old myocardial infarction: Secondary | ICD-10-CM | POA: Insufficient documentation

## 2019-05-16 DIAGNOSIS — I495 Sick sinus syndrome: Secondary | ICD-10-CM | POA: Insufficient documentation

## 2019-05-16 DIAGNOSIS — N400 Enlarged prostate without lower urinary tract symptoms: Secondary | ICD-10-CM | POA: Diagnosis not present

## 2019-05-16 DIAGNOSIS — I255 Ischemic cardiomyopathy: Secondary | ICD-10-CM | POA: Diagnosis not present

## 2019-05-16 HISTORY — PX: TEE WITHOUT CARDIOVERSION: SHX5443

## 2019-05-16 SURGERY — ECHOCARDIOGRAM, TRANSESOPHAGEAL
Anesthesia: Moderate Sedation

## 2019-05-16 MED ORDER — MIDAZOLAM HCL (PF) 5 MG/ML IJ SOLN
INTRAMUSCULAR | Status: AC
  Filled 2019-05-16: qty 2

## 2019-05-16 MED ORDER — LIDOCAINE VISCOUS HCL 2 % MT SOLN
OROMUCOSAL | Status: DC | PRN
  Administered 2019-05-16: 10 mL via OROMUCOSAL

## 2019-05-16 MED ORDER — FENTANYL CITRATE (PF) 100 MCG/2ML IJ SOLN
INTRAMUSCULAR | Status: AC
  Filled 2019-05-16: qty 2

## 2019-05-16 MED ORDER — SODIUM CHLORIDE 0.9 % IV SOLN
INTRAVENOUS | Status: DC
  Administered 2019-05-16: 08:00:00 via INTRAVENOUS

## 2019-05-16 MED ORDER — MIDAZOLAM HCL (PF) 5 MG/ML IJ SOLN
INTRAMUSCULAR | Status: DC | PRN
  Administered 2019-05-16: 2 mg via INTRAVENOUS
  Administered 2019-05-16: 1 mg via INTRAVENOUS

## 2019-05-16 MED ORDER — LIDOCAINE VISCOUS HCL 2 % MT SOLN
OROMUCOSAL | Status: AC
  Filled 2019-05-16: qty 15

## 2019-05-16 MED ORDER — FENTANYL CITRATE (PF) 100 MCG/2ML IJ SOLN
INTRAMUSCULAR | Status: DC | PRN
  Administered 2019-05-16 (×2): 25 ug via INTRAVENOUS

## 2019-05-16 NOTE — CV Procedure (Signed)
    TRANSESOPHAGEAL ECHOCARDIOGRAM   NAME:  [name]    MRN: [id number] DOB:  1941/02/23    ADMIT DATE: 05/16/2019  INDICATIONS: RA mass seen on TTE  PROCEDURE:   Informed consent was obtained prior to the procedure. The risks, benefits and alternatives for the procedure were discussed and the patient comprehended these risks.  Risks include, but are not limited to, cough, sore throat, vomiting, nausea, somnolence, esophageal and stomach trauma or perforation, bleeding, low blood pressure, aspiration, pneumonia, infection, trauma to the teeth and death.    Procedural time out performed. The oropharynx was anesthetized with topical lidocaine.  During this procedure the patient is administered a total of Versed 3 mg and Fentanyl 75 mcg to achieve and maintain moderate conscious sedation.  The patient's heart rate, blood pressure, and oxygen saturation are monitored continuously during the procedure. The period of conscious sedation is 25 minutes, of which I was present face-to-face 100% of this time.   The transesophageal probe was inserted in the esophagus and stomach without difficulty and multiple views were obtained.   COMPLICATIONS:    There were no immediate complications.  KEY FINDINGS:  RA/RV pacer wires seen without evidence of vegetation.   No RA mass seen on this study.   No evidence of endocarditis.   LVEF ~25-30% with akinesis of the anteroseptum.  Full report to follow. Further management per primary team.   [place]ake Bells T. Audie Box, Kathleen  [address] [place],  [address] [phone]  9:26 AM

## 2019-05-16 NOTE — Discharge Instructions (Signed)

## 2019-05-16 NOTE — Progress Notes (Signed)
  Echocardiogram Echocardiogram Transesophageal has been performed.  [name] 05/16/2019, 9:38 AM

## 2019-05-16 NOTE — Interval H&P Note (Signed)
History and Physical Interval Note:  05/16/2019 8:06 AM  [name]  has presented today for surgery, with the diagnosis of ABNORMAL TRANSTHORASIC ECHO.  The various methods of treatment have been discussed with the patient and family. After consideration of risks, benefits and other options for treatment, the patient has consented to  Procedure(s): TRANSESOPHAGEAL ECHOCARDIOGRAM (TEE) (N/A) as a surgical intervention.  The patient's history has been reviewed, patient examined, no change in status, stable for surgery.  I have reviewed the patient's chart and labs.  Questions were answered to the patient's satisfaction.     [place] Bells T O'Neal

## 2019-05-17 ENCOUNTER — Encounter (HOSPITAL_COMMUNITY): Payer: Self-pay | Admitting: Cardiovascular Disease

## 2019-05-18 ENCOUNTER — Telehealth: Payer: Self-pay | Admitting: Cardiovascular Disease

## 2019-05-18 NOTE — Telephone Encounter (Signed)
New Message  Patient is calling in for the results for his echo. Please give patient a call back with his results.

## 2019-05-18 NOTE — Telephone Encounter (Signed)
Results of TEE reviewed with patient and he was advised that removal of his device is not needed. I reviewed the plan of care with him and reminded him of his appointment on 9/22 for lab and nurse visit. He reports some mild diarrhea but is unsure of cause. I advised him to stay well-hydrated and if it does not resolve to call back to report. He verbalized understanding and agreement with plan and thanked me for the call.

## 2019-05-26 ENCOUNTER — Telehealth: Payer: Self-pay | Admitting: Cardiovascular Disease

## 2019-05-26 NOTE — Telephone Encounter (Signed)
Spoke with patient's daughter, Nira Conn, who states patient thinks he is coming in next week to discuss the recent TEE and the heart failure. I advised her of the reason for next week's visit. She states patient is HOH and even though he has a hearing aid, he cannot tell her everything that was discussed with him at the last ov. I advised her to have patient keep the lab appointment for Tues. 9/22 and scheduled an appointment for the patient with Dr. Acie Fredrickson on Monday 9/28 so that daughter can attend with him. She verbalized understanding and agreement with plan and was thankful for the call.

## 2019-05-26 NOTE — Telephone Encounter (Signed)
New Message:   Daughter wants to know if she can come  In with pt  For his appt on 05-30-19.

## 2019-05-30 ENCOUNTER — Other Ambulatory Visit: Payer: Self-pay

## 2019-05-30 ENCOUNTER — Other Ambulatory Visit: Payer: Medicare Other

## 2019-05-30 ENCOUNTER — Ambulatory Visit: Payer: Medicare Other

## 2019-05-30 DIAGNOSIS — I2510 Atherosclerotic heart disease of native coronary artery without angina pectoris: Secondary | ICD-10-CM

## 2019-05-30 LAB — BASIC METABOLIC PANEL
BUN/Creatinine Ratio: 22 (ref 10–24)
BUN: 22 mg/dL (ref 8–27)
CO2: 24 mmol/L (ref 20–29)
Calcium: 9.5 mg/dL (ref 8.6–10.2)
Chloride: 105 mmol/L (ref 96–106)
Creatinine, Ser: 1.02 mg/dL (ref 0.76–1.27)
GFR calc Af Amer: 80 mL/min/{1.73_m2} (ref >59)
GFR calc non Af Amer: 70 mL/min/{1.73_m2} (ref >59)
Glucose: 84 mg/dL (ref 65–99)
Potassium: 4.4 mmol/L (ref 3.5–5.2)
Sodium: 141 mmol/L (ref 134–144)

## 2019-06-05 ENCOUNTER — Ambulatory Visit (INDEPENDENT_AMBULATORY_CARE_PROVIDER_SITE_OTHER): Payer: Medicare Other | Admitting: Cardiovascular Disease

## 2019-06-05 ENCOUNTER — Encounter: Payer: Self-pay | Admitting: Cardiovascular Disease

## 2019-06-05 ENCOUNTER — Other Ambulatory Visit: Payer: Self-pay

## 2019-06-05 VITALS — BP 102/70 | HR 90 | Ht 71.0 in | Wt 214.8 lb

## 2019-06-05 DIAGNOSIS — I2510 Atherosclerotic heart disease of native coronary artery without angina pectoris: Secondary | ICD-10-CM | POA: Diagnosis not present

## 2019-06-05 DIAGNOSIS — I5022 Chronic systolic (congestive) heart failure: Secondary | ICD-10-CM | POA: Diagnosis not present

## 2019-06-05 MED ORDER — CARVEDILOL 3.125 MG PO TABS: 3.1250 mg | ORAL_TABLET | Freq: Every day | ORAL | 11 refills | Status: DC

## 2019-06-05 NOTE — Patient Instructions (Signed)
Medication Instructions:  Your physician has recommended you make the following change in your medication:  TAKE Carvedilol 3.125 mg at bedtime  If you need a refill on your cardiac medications before your next appointment, please call your pharmacy.    Lab work: None Ordered    Testing/Procedures: None Ordered    Follow-Up: Your physician recommends that you return for a follow-up appointment on Thursday October 15 at 10:20 am with Dr. Acie Fredrickson
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
## 2019-06-12 ENCOUNTER — Ambulatory Visit (INDEPENDENT_AMBULATORY_CARE_PROVIDER_SITE_OTHER): Payer: Medicare Other | Admitting: *Deleted

## 2019-06-12 DIAGNOSIS — I255 Ischemic cardiomyopathy: Secondary | ICD-10-CM | POA: Diagnosis not present

## 2019-06-12 DIAGNOSIS — I5022 Chronic systolic (congestive) heart failure: Secondary | ICD-10-CM

## 2019-06-13 LAB — CUP PACEART REMOTE DEVICE CHECK
Battery Remaining Longevity: 21 mo
Battery Voltage: 2.92 V
Brady Statistic AP VP Percent: 47.95 %
Brady Statistic AP VS Percent: 0.44 %
Brady Statistic AS VP Percent: 32.19 %
Brady Statistic AS VS Percent: 19.42 %
Brady Statistic RA Percent Paced: 38.72 %
Brady Statistic RV Percent Paced: 78.78 %
Date Time Interrogation Session: 20201005115156
HighPow Impedance: 47 Ohm
HighPow Impedance: 61 Ohm
Implantable Lead Implant Date: 20040315
Implantable Lead Implant Date: 20100611
Implantable Lead Implant Date: 20100611
Implantable Lead Location: 753858
Implantable Lead Location: 753859
Implantable Lead Location: 753860
Implantable Lead Model: 4196
Implantable Lead Model: 5076
Implantable Lead Model: 6947
Implantable Pulse Generator Implant Date: 20160302
Lead Channel Impedance Value: 342 Ohm
Lead Channel Impedance Value: 418 Ohm
Lead Channel Impedance Value: 418 Ohm
Lead Channel Impedance Value: 456 Ohm
Lead Channel Impedance Value: 475 Ohm
Lead Channel Impedance Value: 779 Ohm
Lead Channel Pacing Threshold Amplitude: 0.625 V
Lead Channel Pacing Threshold Amplitude: 0.625 V
Lead Channel Pacing Threshold Pulse Width: 0.4 ms
Lead Channel Pacing Threshold Pulse Width: 0.4 ms
Lead Channel Sensing Intrinsic Amplitude: 1.875 mV
Lead Channel Sensing Intrinsic Amplitude: 1.875 mV
Lead Channel Sensing Intrinsic Amplitude: 14 mV
Lead Channel Sensing Intrinsic Amplitude: 14 mV
Lead Channel Setting Pacing Amplitude: 1.5 V
Lead Channel Setting Pacing Amplitude: 1.5 V
Lead Channel Setting Pacing Amplitude: 1.5 V
Lead Channel Setting Pacing Pulse Width: 0.4 ms
Lead Channel Setting Pacing Pulse Width: 1 ms
Lead Channel Setting Sensing Sensitivity: 0.3 mV

## 2019-06-19 NOTE — Progress Notes (Signed)
Remote ICD transmission.   

## 2019-06-20 NOTE — Progress Notes (Deleted)
Electrophysiology Office Note Date: 06/20/2019  ID:  [name], DOB 21-Sep-1940, MRN [id number]  PCP: James Downing, MD Primary Cardiologist: [name] Electrophysiologist: [name]  CC: Routine ICD follow-up  [name] is a 78 y.o. male seen today for Dr [name].  He presents today for routine electrophysiology followup.  Since last being seen in our clinic, the patient reports doing very well. He denies chest pain, palpitations, dyspnea, PND, orthopnea, nausea, vomiting, dizziness, syncope, edema, weight gain, or early satiety.  He has not had ICD shocks.   Device History: MDT single chamber ICD implanted 2004 for ICM; upgrade to CRTD 2010; gen change 2016   Past Medical History:  Diagnosis Date  . Arthritis    knees, back   . BPH (benign prostatic hypertrophy)   . Chronic kidney disease    BPH  . Chronic systolic heart failure ([place])    a. s/p MDT single chamber ICD 2004 as part of MASTER study b. upgrade to CRTD 2010; CRTD gen change 2016  . Coronary artery disease    a. s/p anterior MI and CYPHER stent to LAD 2005  . Dyslipidemia   . Gout   . Ischemic cardiomyopathy   . Paroxysmal atrial fibrillation (HCC)   . Ventricular tachycardia [facility])    Past Surgical History:  Procedure Laterality Date  . APPENDECTOMY    . BI-VENTRICULAR IMPLANTABLE CARDIOVERTER DEFIBRILLATOR UPGRADE N/A 11/07/2014   a. MDT single chamber ICD implanted 2004 as part of MASTER study; upgrade to CRTD 2010; gen change 2016  . CARDIAC CATHETERIZATION  2005  . CORONARY ANGIOPLASTY     5 stents   . KNEE ARTHROSCOPY    . OTHER SURGICAL HISTORY     right ear surgery as a child   . PROSTATECTOMY  11/06/2011   Procedure: PROSTATECTOMY SUPRAPUBIC;  Surgeon: Ailene Rud, MD;  [facility];  Service: Urology;  Laterality: N/A;  Open Suprapubic Prostatectomy  . TEE WITHOUT CARDIOVERSION N/A 05/16/2019   Procedure: TRANSESOPHAGEAL ECHOCARDIOGRAM (TEE);  Surgeon: Geralynn Rile, MD;   Location: [place];  Service: Cardiology;  Laterality: N/A;    Current Outpatient Medications  Medication Sig Dispense Refill  . allopurinol (ZYLOPRIM) 100 MG tablet Take 100 mg by mouth daily.    . carvedilol (COREG) 3.125 MG tablet Take 1 tablet (3.125 mg total) by mouth at bedtime. 30 tablet 11  . ELIQUIS 5 MG TABS tablet TAKE 1 TABLET BY MOUTH TWO  TIMES DAILY 180 tablet 1  . latanoprost (XALATAN) 0.005 % ophthalmic solution Place 1 drop into both eyes at bedtime.     . naproxen sodium (ALEVE) 220 MG tablet Take 220 mg by mouth daily as needed (pain).    [name] Glycol-Propyl Glycol 0.4-0.3 % SOLN Place 1 drop into both eyes 3 (three) times daily as needed (dry/irritated eyes.).    [name] rosuvastatin (CRESTOR) 10 MG tablet TAKE ONE-HALF TABLET BY  MOUTH DAILY 45 tablet 3  . sacubitril-valsartan (ENTRESTO) 24-26 MG Take 1 tablet by mouth 2 (two) times daily. 60 tablet 11  . spironolactone (ALDACTONE) 25 MG tablet TAKE ONE-HALF TABLET BY  MOUTH DAILY 45 tablet 3  . TURMERIC PO Take 538 mg by mouth daily.      No current facility-administered medications for this visit.     Allergies:   Atorvastatin   Social History: Social History   Socioeconomic History  . Marital status: Married    Spouse name: Not on file  . Number of  children: Not on file  . Years of education: Not on file  . Highest education level: Not on file  Occupational History  . Not on file  Social Needs  . Financial resource strain: Not on file  . Food insecurity    Worry: Not on file    Inability: Not on file  . Transportation needs    Medical: Not on file    Non-medical: Not on file  Tobacco Use  . Smoking status: Never Smoker  . Smokeless tobacco: Never Used  Substance and Sexual Activity  . Alcohol use: No    Comment: occasional beer   . Drug use: No  . Sexual activity: Not on file  Lifestyle  . Physical activity    Days per week: Not on file    Minutes per session: Not on file  . Stress:  Not on file  Relationships  . Social Musicianconnections    Talks on phone: Not on file    Gets together: Not on file    Attends religious service: Not on file    Active member of club or organization: Not on file    Attends meetings of clubs or organizations: Not on file    Relationship status: Not on file  . Intimate partner violence    Fear of current or ex partner: Not on file    Emotionally abused: Not on file    Physically abused: Not on file    Forced sexual activity: Not on file  Other Topics Concern  . Not on file  Social History Narrative  . Not on file    Family History: Family History  Problem Relation Age of Onset  . Heart disease Sister        3 8722f the 4 had HD  . Heart disease Brother        2 of 3 had HD  . Heart disease Sister        1 of the 6 has HD  . Heart disease Brother     Review of Systems: All other systems reviewed and are otherwise negative except as noted above.   Physical Exam: VS:  There were no vitals taken for this visit. , BMI There is no height or weight on file to calculate BMI.  GEN- The patient is well appearing, alert and oriented x 3 today.   HEENT: normocephalic, atraumatic; sclera clear, conjunctiva pink; hearing intact; oropharynx clear; neck supple, no JVP Lymph- no cervical lymphadenopathy Lungs- Clear to ausculation bilaterally, normal work of breathing.  No wheezes, rales, rhonchi Heart- Regular rate and rhythm, no murmurs, rubs or gallops, PMI not laterally displaced GI- soft, non-tender, non-distended, bowel sounds present, no hepatosplenomegaly Extremities- no clubbing, cyanosis, or edema; DP/PT/radial pulses 2+ bilaterally MS- no significant deformity or atrophy Skin- warm and dry, no rash or lesion; ICD pocket well healed Psych- euthymic mood, full affect Neuro- strength and sensation are intact  ICD interrogation- reviewed in detail today,  See PACEART report  EKG:  EKG is not ordered today.  Recent Labs: 01/24/2019:  Hemoglobin 14.6; Platelets 223 05/04/2019: TSH 2.870 05/12/2019: ALT 12 05/30/2019: BUN 22; Creatinine, Ser 1.02; Potassium 4.4; Sodium 141   Wt Readings from Last 3 Encounters:  06/05/19 214 lb 12.8 oz (97.4 kg)  05/04/19 214 lb 4 oz (97.2 kg)  06/21/18 211 lb 9.6 oz (96 kg)     Other studies Reviewed: Additional studies/ records that were reviewed today include: Dr Graciela HusbandsKlein and Dr Harvie BridgeNahser's office notes  Assessment and Plan:  1.  Chronic systolic dysfunction euvolemic today Stable on an appropriate medical regimen Normal ICD function See Pace Art report No changes today  2.  CAD No recent ischemic symptoms  3.  Persistent atrial fibrillation Burden by device interrogation ***%, persistent since 05/2019 and reduced CRT pacing % Continue Eliquis for CHADS2VASC of 4 - he reports compliance with no missed doses DCCV ***   Current medicines are reviewed at length with the patient today.   The patient {ACTIONS; HAS/DOES NOT HAVE:19233} concerns regarding his medicines.  The following changes were made today:  {NONE DEFAULTED:18576::"none"}  Labs/ tests ordered today include: *** No orders of the defined types were placed in this encounter.    Disposition:   Follow up with *** {gen number 2-72:536644} {TIME; UNITS DAY/WEEK/MONTH:19136}   Signed, Gypsy Balsam, NP 06/20/2019 9:01 AM  [facility] [address] [place] [address] [phone] (office) [phone] (fax)

## 2019-06-22 ENCOUNTER — Other Ambulatory Visit: Payer: Self-pay

## 2019-06-22 ENCOUNTER — Ambulatory Visit (INDEPENDENT_AMBULATORY_CARE_PROVIDER_SITE_OTHER): Payer: Medicare Other | Admitting: Cardiovascular Disease

## 2019-06-22 ENCOUNTER — Encounter: Payer: Self-pay | Admitting: Cardiovascular Disease

## 2019-06-22 VITALS — BP 98/70 | HR 91 | Ht 71.0 in | Wt 213.4 lb

## 2019-06-22 DIAGNOSIS — E785 Hyperlipidemia, unspecified: Secondary | ICD-10-CM | POA: Diagnosis not present

## 2019-06-22 DIAGNOSIS — I5022 Chronic systolic (congestive) heart failure: Secondary | ICD-10-CM

## 2019-06-22 MED ORDER — CARVEDILOL 3.125 MG PO TABS: 3.1250 mg | ORAL_TABLET | Freq: Two times a day (BID) | ORAL | 3 refills | Status: DC
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
## 2019-06-22 NOTE — Patient Instructions (Signed)
Medication Instructions:  Your physician has recommended you make the following change in your medication:  INCREASE Carvedilol to 3.125 mg twice daily - once in the morning and once in the evening  *If you need a refill on your cardiac medications before your next appointment, please call your pharmacy*  Lab Work: None Ordered    Testing/Procedures: None Ordered   Follow-Up: At [facility], you and your health needs are our priority.  As part of our continuing mission to provide you with exceptional heart care, we have created designated Provider Care Teams.  These Care Teams include your primary Cardiologist (physician) and Advanced Practice Providers (APPs -  Physician Assistants and Nurse Practitioners) who all work together to provide you with the care you need, when you need it.  Your next appointment:   3 months on Friday January 15 @ 10:00 am  The format for your next appointment:   In Person  Provider:   You may see Mertie Moores, MD or one of the following Advanced Practice Providers on your designated Care Team:    Richardson Dopp, PA-C  [place], [place]  Daune Perch, [place]

## 2019-06-23 ENCOUNTER — Encounter: Payer: Medicare Other | Admitting: Nurse Practitioner

## 2019-06-30 ENCOUNTER — Telehealth: Payer: Self-pay

## 2019-06-30 NOTE — Telephone Encounter (Signed)
-----   Message from Thayer Headings, MD sent at 06/30/2019  7:49 AM EDT ----- This may explain some of his recent symptoms  Miyako Oelke,  Please schedule him for a cardioversion

## 2019-06-30 NOTE — Telephone Encounter (Signed)
Pt verbalized understanding of his instructions for his cardioversion.. he wrote everything down and read back to me. He will call back if he has any further questions. His daughter will take him for his procedure.   Msg sent to precert.

## 2019-06-30 NOTE — Telephone Encounter (Addendum)
Spoke with the pt and he agreed to Dr. Elmarie Shiley recommendations to have a OP cardioversion for his Afib..   Pt sched for cardioversion 07/04/19 with Dr. Johnsie Cancel... case ID # [id number]...[name] pt to have COVID testing 07/01/19 at 10:50 am.   LM for pt to call back for his dates and times.

## 2019-07-01 ENCOUNTER — Other Ambulatory Visit (HOSPITAL_COMMUNITY)
Admission: RE | Admit: 2019-07-01 | Discharge: 2019-07-01 | Disposition: A | Discharge status: Home / Self Care | Payer: Medicare Other | Source: Ambulatory Visit | Attending: Cardiovascular Disease | Admitting: Cardiovascular Disease

## 2019-07-01 DIAGNOSIS — Z01812 Encounter for preprocedural laboratory examination: Secondary | ICD-10-CM | POA: Diagnosis present

## 2019-07-01 DIAGNOSIS — Z20828 Contact with and (suspected) exposure to other viral communicable diseases: Secondary | ICD-10-CM | POA: Diagnosis not present

## 2019-07-02 LAB — NOVEL CORONAVIRUS, NAA (HOSP ORDER, SEND-OUT TO REF LAB; TAT 18-24 HRS): SARS-CoV-2, NAA: NOT DETECTED

## 2019-07-03 NOTE — Progress Notes (Signed)
Called to confirm quarantine since Covid test. Patient has been at home, no symptoms. All questions answered.

## 2019-07-04 ENCOUNTER — Ambulatory Visit (HOSPITAL_COMMUNITY): Payer: Medicare Other | Admitting: Certified Registered Nurse Anesthetist

## 2019-07-04 ENCOUNTER — Ambulatory Visit (HOSPITAL_COMMUNITY)
Admission: RE | Admit: 2019-07-04 | Discharge: 2019-07-04 | Disposition: A | Discharge status: Home / Self Care | Payer: Medicare Other | Attending: Cardiovascular Disease | Admitting: Cardiovascular Disease

## 2019-07-04 ENCOUNTER — Encounter (HOSPITAL_COMMUNITY): Payer: Self-pay | Admitting: Certified Registered Nurse Anesthetist

## 2019-07-04 ENCOUNTER — Encounter (HOSPITAL_COMMUNITY)
Admission: RE | Disposition: A | Discharge status: Home / Self Care | Payer: Self-pay | Source: Home / Self Care | Attending: Cardiovascular Disease

## 2019-07-04 ENCOUNTER — Other Ambulatory Visit: Payer: Self-pay

## 2019-07-04 DIAGNOSIS — N400 Enlarged prostate without lower urinary tract symptoms: Secondary | ICD-10-CM | POA: Insufficient documentation

## 2019-07-04 DIAGNOSIS — Z7901 Long term (current) use of anticoagulants: Secondary | ICD-10-CM | POA: Diagnosis not present

## 2019-07-04 DIAGNOSIS — M1990 Unspecified osteoarthritis, unspecified site: Secondary | ICD-10-CM | POA: Diagnosis not present

## 2019-07-04 DIAGNOSIS — Z95810 Presence of automatic (implantable) cardiac defibrillator: Secondary | ICD-10-CM | POA: Diagnosis not present

## 2019-07-04 DIAGNOSIS — I252 Old myocardial infarction: Secondary | ICD-10-CM | POA: Diagnosis not present

## 2019-07-04 DIAGNOSIS — I480 Paroxysmal atrial fibrillation: Secondary | ICD-10-CM | POA: Diagnosis present

## 2019-07-04 DIAGNOSIS — I2510 Atherosclerotic heart disease of native coronary artery without angina pectoris: Secondary | ICD-10-CM | POA: Insufficient documentation

## 2019-07-04 DIAGNOSIS — N189 Chronic kidney disease, unspecified: Secondary | ICD-10-CM | POA: Insufficient documentation

## 2019-07-04 DIAGNOSIS — E785 Hyperlipidemia, unspecified: Secondary | ICD-10-CM | POA: Diagnosis not present

## 2019-07-04 DIAGNOSIS — I5022 Chronic systolic (congestive) heart failure: Secondary | ICD-10-CM | POA: Insufficient documentation

## 2019-07-04 DIAGNOSIS — M109 Gout, unspecified: Secondary | ICD-10-CM | POA: Diagnosis not present

## 2019-07-04 DIAGNOSIS — Z888 Allergy status to other drugs, medicaments and biological substances status: Secondary | ICD-10-CM | POA: Diagnosis not present

## 2019-07-04 DIAGNOSIS — Z955 Presence of coronary angioplasty implant and graft: Secondary | ICD-10-CM | POA: Insufficient documentation

## 2019-07-04 DIAGNOSIS — I4891 Unspecified atrial fibrillation: Secondary | ICD-10-CM | POA: Diagnosis not present

## 2019-07-04 DIAGNOSIS — Z8249 Family history of ischemic heart disease and other diseases of the circulatory system: Secondary | ICD-10-CM | POA: Insufficient documentation

## 2019-07-04 DIAGNOSIS — I255 Ischemic cardiomyopathy: Secondary | ICD-10-CM | POA: Insufficient documentation

## 2019-07-04 DIAGNOSIS — Z79899 Other long term (current) drug therapy: Secondary | ICD-10-CM | POA: Insufficient documentation

## 2019-07-04 HISTORY — PX: CARDIOVERSION: SHX1299

## 2019-07-04 LAB — POCT I-STAT, CHEM 8
BUN: 17 mg/dL (ref 8–23)
Calcium, Ion: 1.21 mmol/L (ref 1.15–1.40)
Chloride: 103 mmol/L (ref 98–111)
Creatinine, Ser: 0.8 mg/dL (ref 0.61–1.24)
Glucose, Bld: 103 mg/dL — ABNORMAL HIGH (ref 70–99)
HCT: 47 % (ref 39.0–52.0)
Hemoglobin: 16 g/dL (ref 13.0–17.0)
Potassium: 4.4 mmol/L (ref 3.5–5.1)
Sodium: 140 mmol/L (ref 135–145)
TCO2: 24 mmol/L (ref 22–32)

## 2019-07-04 SURGERY — CARDIOVERSION
Anesthesia: General

## 2019-07-04 MED ORDER — LIDOCAINE 2% (20 MG/ML) 5 ML SYRINGE
INTRAMUSCULAR | Status: DC | PRN
  Administered 2019-07-04: 30 mg via INTRAVENOUS

## 2019-07-04 MED ORDER — PROPOFOL 10 MG/ML IV BOLUS
INTRAVENOUS | Status: DC | PRN
  Administered 2019-07-04: 60 mg via INTRAVENOUS

## 2019-07-04 MED ORDER — SODIUM CHLORIDE 0.9 % IV SOLN
INTRAVENOUS | Status: DC
  Administered 2019-07-04: 13:00:00 via INTRAVENOUS

## 2019-07-04 NOTE — Anesthesia Postprocedure Evaluation (Signed)
Anesthesia Post Note  Patient: Jaquail L Paladino  Procedure(s) Performed: CARDIOVERSION (N/A )     Patient location during evaluation: Endoscopy Anesthesia Type: General Level of consciousness: awake and alert Pain management: pain level controlled Vital Signs Assessment: post-procedure vital signs reviewed and stable Respiratory status: spontaneous breathing, nonlabored ventilation, respiratory function stable and patient connected to nasal cannula oxygen Cardiovascular status: blood pressure returned to baseline and stable Postop Assessment: no apparent nausea or vomiting Anesthetic complications: no    Last Vitals:  Vitals:   07/04/19 1410 07/04/19 1420  BP: (!) 147/84 133/75  Pulse: 76 73  Resp: 17 20  Temp:    SpO2: 100% 100%    Last Pain:  Vitals:   07/04/19 1420  TempSrc:   PainSc: 0-No pain                 Duquan Gillooly COKER

## 2019-07-04 NOTE — CV Procedure (Signed)
No missed doses of eliquis DCC Anesthesia: Propofol  DCC x 2 120 then 150 J's biphasic Converted from afib to AV pacing with NSR  Confirmed by atrial electrogram from AICD Medtronic indicates normal AICD function  No immediate neurologic sequelae  Jenkins Rouge

## 2019-07-04 NOTE — Transfer of Care (Signed)
Immediate Anesthesia Transfer of Care Note  Patient: [name]  Procedure(s) Performed: CARDIOVERSION (N/A )  Patient Location: PACU  Anesthesia Type:General  Level of Consciousness: drowsy and responds to stimulation  Airway & Oxygen Therapy: Patient Spontanous Breathing  Post-op Assessment: Report given to RN and Post -op Vital signs reviewed and stable  Post vital signs: Reviewed and stable  Last Vitals:  Vitals Value Taken Time  BP 103/58 07/04/19 1319  Temp    Pulse 64 07/04/19 1319  Resp 14 07/04/19 1319  SpO2 99 % 07/04/19 1319    Last Pain:  Vitals:   07/04/19 1152  TempSrc: Oral  PainSc: 0-No pain         Complications: No apparent anesthesia complications

## 2019-07-04 NOTE — Interval H&P Note (Signed)
History and Physical Interval Note:  07/04/2019 12:42 PM  [name]  has presented today for surgery, with the diagnosis of atrial fibrillation.  The various methods of treatment have been discussed with the patient and family. After consideration of risks, benefits and other options for treatment, the patient has consented to  Procedure(s): CARDIOVERSION (N/A) as a surgical intervention.  The patient's history has been reviewed, patient examined, no change in status, stable for surgery.  I have reviewed the patient's chart and labs.  Questions were answered to the patient's satisfaction.     Jenkins Rouge

## 2019-07-04 NOTE — Anesthesia Preprocedure Evaluation (Signed)
Anesthesia Evaluation  Patient identified by MRN, date of birth, ID band Patient awake    Reviewed: Allergy & Precautions, NPO status , Patient's Chart, lab work & pertinent test results  Airway Mallampati: II  TM Distance: >3 FB Neck ROM: Full    Dental  (+) Teeth Intact   Pulmonary    breath sounds clear to auscultation       Cardiovascular  Rhythm:Irregular Rate:Normal     Neuro/Psych    GI/Hepatic   Endo/Other    Renal/GU      Musculoskeletal   Abdominal   Peds  Hematology   Anesthesia Other Findings   Reproductive/Obstetrics                             Anesthesia Physical Anesthesia Plan  ASA: III  Anesthesia Plan: General   Post-op Pain Management:    Induction: Intravenous  PONV Risk Score and Plan:   Airway Management Planned: Mask and Natural Airway  Additional Equipment:   Intra-op Plan:   Post-operative Plan:   Informed Consent: I have reviewed the patients History and Physical, chart, labs and discussed the procedure including the risks, benefits and alternatives for the proposed anesthesia with the patient or authorized representative who has indicated his/her understanding and acceptance.       Plan Discussed with: CRNA and Anesthesiologist  Anesthesia Plan Comments:         Anesthesia Quick Evaluation

## 2019-07-04 NOTE — Anesthesia Procedure Notes (Signed)
Procedure Name: General with mask airway Date/Time: 07/04/2019 1:14 PM Performed by: Janace Litten, CRNA Pre-anesthesia Checklist: Patient identified, Emergency Drugs available, Suction available, Patient being monitored and Timeout performed Patient Re-evaluated:Patient Re-evaluated prior to induction Oxygen Delivery Method: Ambu bag Preoxygenation: Pre-oxygenation with 100% oxygen Induction Type: IV induction

## 2019-07-05 ENCOUNTER — Telehealth: Payer: Self-pay | Admitting: Cardiovascular Disease

## 2019-07-05 ENCOUNTER — Encounter (HOSPITAL_COMMUNITY): Payer: Self-pay | Admitting: Cardiovascular Disease

## 2019-07-05 NOTE — Telephone Encounter (Signed)
See staff note. We will be starting on amiodarone

## 2019-07-05 NOTE — Telephone Encounter (Signed)
° °  Patient calling with follow up questions from 10/27 cardioversion

## 2019-07-05 NOTE — Telephone Encounter (Signed)
I spoke with pt. He reports Cardioversion did not work and he went back to irregular rhythm. He states Dr Johnsie Cancel recommended a medication.  Pt is calling to follow up and see what medication is recommended.

## 2019-07-06 MED ORDER — AMIODARONE HCL 200 MG PO TABS: ORAL_TABLET | ORAL | 3 refills | Status: DC

## 2019-07-06 NOTE — Telephone Encounter (Signed)
Lets start amio 200 mg po BID for 3-4 weeks and then reduce to 200 mg a day and see him back ( either to see me or APP) in 2 months and try cardioversion again   Reviewed Dr. Elmarie Shiley advice above with patient who verbalized understanding. He agrees to start Amiodarone 200 mg twice daily until Thanksgiving Day, 11/26. He will then reduce to 200 mg daily and has an appointment with Dr. Acie Fredrickson on 11/30. I advised him to call back with questions or concerns prior to follow-up appointment. He verbalized understanding and agreement with plan and thanked me for the call.

## 2019-07-06 NOTE — Telephone Encounter (Signed)
Follow up:     Patient calling back concering note from yesterday. Please call patient back.

## 2019-08-07 ENCOUNTER — Encounter: Payer: Self-pay | Admitting: Cardiovascular Disease

## 2019-08-07 ENCOUNTER — Ambulatory Visit (INDEPENDENT_AMBULATORY_CARE_PROVIDER_SITE_OTHER): Payer: Medicare Other | Admitting: Cardiovascular Disease

## 2019-08-07 ENCOUNTER — Other Ambulatory Visit: Payer: Self-pay

## 2019-08-07 VITALS — BP 100/58 | HR 81 | Ht 71.0 in | Wt 217.8 lb

## 2019-08-07 DIAGNOSIS — I4819 Other persistent atrial fibrillation: Secondary | ICD-10-CM

## 2019-08-07 DIAGNOSIS — I5022 Chronic systolic (congestive) heart failure: Secondary | ICD-10-CM | POA: Diagnosis not present

## 2019-08-07 DIAGNOSIS — Z7901 Long term (current) use of anticoagulants: Secondary | ICD-10-CM | POA: Diagnosis not present

## 2019-08-07 MED ORDER — AMIODARONE HCL 200 MG PO TABS: 200.0000 mg | ORAL_TABLET | Freq: Every day | ORAL | 3 refills | Status: DC

## 2019-08-07 NOTE — Patient Instructions (Signed)
Medication Instructions:  Your physician recommends that you continue on your current medications as directed. Please refer to the Current Medication list given to you today.  *If you need a refill on your cardiac medications before your next appointment, please call your pharmacy*  Lab Work: TODAY - CBC, BMET  If you have labs (blood work) drawn today and your tests are completely normal, you will receive your results only by: [name] MyChart Message (if you have MyChart) OR . A paper copy in the mail If you have any lab test that is abnormal or we need to change your treatment, we will call you to review the results.  Testing/Procedures: You are scheduled for a Cardioversion on Thursday Dec. 10 with Dr. Acie Fredrickson.  Please arrive at the [facility] (Main Entrance A) at [facility]: [address] [place], [place] [address] at 12:30 pm.   DIET: Nothing to eat or drink after midnight except a sip of water with medications (see medication instructions below)  Your Pre-procedure COVID-19 Testing will be done on Monday Dec. 7 at [facility] at 2:00 pm- [address] at [address], [place], [place] [address]. Once you arrive at the testing site, stay in the right hand lane, go under the building overhang not the tent. If you are tested under the tent your results may not be back before your procedure. Please be on time for your appointment.  After your swab you will be given a mask to wear and instructed to go home and quarantine/no visitors until after your procedure. If you test positive you will be notified and your procedure will be cancelled.    Medication Instructions:  Continue your anticoagulant: Eliquis You will need to continue your anticoagulant after your procedure until  you are told by your Provider that it is safe to stop  You may take your medications on the morning of your procedure with a  sip of water   You must have a responsible person to drive you home  and stay in the waiting area during your procedure. Failure to do so could result in cancellation.  Bring your insurance cards.  *Special Note: Every effort is made to have your procedure done on time. Occasionally there are emergencies that occur at the hospital that may cause delays. Please be patient if a delay does occur.    Follow-Up: At [facility], you and your health needs are our priority.  As part of our continuing mission to provide you with exceptional heart care, we have created designated Provider Care Teams.  These Care Teams include your primary Cardiologist (physician) and Advanced Practice Providers (APPs -  Physician Assistants and Nurse Practitioners) who all work together to provide you with the care you need, when you need it.  Your next appointment:   3 month(s) on March 2  The format for your next appointment:   In Person  Provider:   You may see Mertie Moores, MD or one of the following Advanced Practice Providers on your designated Care Team:    Richardson Dopp, PA-C  [place], [place]  Daune Perch, [place]
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
## 2019-08-08 LAB — CBC
Hematocrit: 46 % (ref 37.5–51.0)
Hemoglobin: 15.8 g/dL (ref 13.0–17.7)
MCH: 32.2 pg (ref 26.6–33.0)
MCHC: 34.3 g/dL (ref 31.5–35.7)
MCV: 94 fL (ref 79–97)
Platelets: 264 10*3/uL (ref 150–450)
RBC: 4.91 x10E6/uL (ref 4.14–5.80)
RDW: 12.2 % (ref 11.6–15.4)
WBC: 8.2 10*3/uL (ref 3.4–10.8)

## 2019-08-08 LAB — BASIC METABOLIC PANEL
BUN/Creatinine Ratio: 16 (ref 10–24)
BUN: 20 mg/dL (ref 8–27)
CO2: 21 mmol/L (ref 20–29)
Calcium: 9.2 mg/dL (ref 8.6–10.2)
Chloride: 106 mmol/L (ref 96–106)
Creatinine, Ser: 1.27 mg/dL (ref 0.76–1.27)
GFR calc Af Amer: 62 mL/min/{1.73_m2} (ref >59)
GFR calc non Af Amer: 53 mL/min/{1.73_m2} — ABNORMAL LOW (ref >59)
Glucose: 132 mg/dL — ABNORMAL HIGH (ref 65–99)
Potassium: 4.6 mmol/L (ref 3.5–5.2)
Sodium: 141 mmol/L (ref 134–144)

## 2019-08-14 ENCOUNTER — Other Ambulatory Visit (HOSPITAL_COMMUNITY)
Admission: RE | Admit: 2019-08-14 | Discharge: 2019-08-14 | Disposition: A | Discharge status: Home / Self Care | Payer: Medicare Other | Source: Ambulatory Visit | Attending: Cardiovascular Disease | Admitting: Cardiovascular Disease

## 2019-08-14 DIAGNOSIS — Z01812 Encounter for preprocedural laboratory examination: Secondary | ICD-10-CM | POA: Diagnosis present

## 2019-08-14 DIAGNOSIS — Z20828 Contact with and (suspected) exposure to other viral communicable diseases: Secondary | ICD-10-CM | POA: Diagnosis not present

## 2019-08-15 LAB — NOVEL CORONAVIRUS, NAA (HOSP ORDER, SEND-OUT TO REF LAB; TAT 18-24 HRS): SARS-CoV-2, NAA: NOT DETECTED

## 2019-08-17 ENCOUNTER — Other Ambulatory Visit: Payer: Self-pay

## 2019-08-17 ENCOUNTER — Ambulatory Visit (HOSPITAL_COMMUNITY)
Admission: RE | Admit: 2019-08-17 | Discharge: 2019-08-17 | Disposition: A | Discharge status: Home / Self Care | Payer: Medicare Other | Attending: Cardiovascular Disease | Admitting: Cardiovascular Disease

## 2019-08-17 ENCOUNTER — Ambulatory Visit (HOSPITAL_COMMUNITY): Payer: Medicare Other | Admitting: Certified Registered Nurse Anesthetist

## 2019-08-17 ENCOUNTER — Encounter (HOSPITAL_COMMUNITY): Payer: Self-pay | Admitting: Cardiovascular Disease

## 2019-08-17 ENCOUNTER — Encounter (HOSPITAL_COMMUNITY)
Admission: RE | Disposition: A | Discharge status: Home / Self Care | Payer: Self-pay | Source: Home / Self Care | Attending: Cardiovascular Disease

## 2019-08-17 DIAGNOSIS — I4891 Unspecified atrial fibrillation: Secondary | ICD-10-CM

## 2019-08-17 DIAGNOSIS — N400 Enlarged prostate without lower urinary tract symptoms: Secondary | ICD-10-CM | POA: Insufficient documentation

## 2019-08-17 DIAGNOSIS — Z95810 Presence of automatic (implantable) cardiac defibrillator: Secondary | ICD-10-CM | POA: Diagnosis not present

## 2019-08-17 DIAGNOSIS — M1990 Unspecified osteoarthritis, unspecified site: Secondary | ICD-10-CM | POA: Insufficient documentation

## 2019-08-17 DIAGNOSIS — I5022 Chronic systolic (congestive) heart failure: Secondary | ICD-10-CM | POA: Diagnosis not present

## 2019-08-17 DIAGNOSIS — Z79899 Other long term (current) drug therapy: Secondary | ICD-10-CM | POA: Diagnosis not present

## 2019-08-17 DIAGNOSIS — I2510 Atherosclerotic heart disease of native coronary artery without angina pectoris: Secondary | ICD-10-CM | POA: Diagnosis not present

## 2019-08-17 DIAGNOSIS — I4819 Other persistent atrial fibrillation: Secondary | ICD-10-CM | POA: Diagnosis not present

## 2019-08-17 DIAGNOSIS — I255 Ischemic cardiomyopathy: Secondary | ICD-10-CM | POA: Insufficient documentation

## 2019-08-17 DIAGNOSIS — Z7901 Long term (current) use of anticoagulants: Secondary | ICD-10-CM | POA: Diagnosis not present

## 2019-08-17 DIAGNOSIS — E785 Hyperlipidemia, unspecified: Secondary | ICD-10-CM | POA: Insufficient documentation

## 2019-08-17 DIAGNOSIS — I252 Old myocardial infarction: Secondary | ICD-10-CM | POA: Diagnosis not present

## 2019-08-17 DIAGNOSIS — N189 Chronic kidney disease, unspecified: Secondary | ICD-10-CM | POA: Diagnosis not present

## 2019-08-17 DIAGNOSIS — I480 Paroxysmal atrial fibrillation: Secondary | ICD-10-CM | POA: Diagnosis present

## 2019-08-17 DIAGNOSIS — M109 Gout, unspecified: Secondary | ICD-10-CM | POA: Insufficient documentation

## 2019-08-17 HISTORY — PX: CARDIOVERSION: SHX1299

## 2019-08-17 SURGERY — CARDIOVERSION
Anesthesia: General

## 2019-08-17 MED ORDER — LIDOCAINE 2% (20 MG/ML) 5 ML SYRINGE
INTRAMUSCULAR | Status: DC | PRN
  Administered 2019-08-17: 100 mg via INTRAVENOUS

## 2019-08-17 MED ORDER — SODIUM CHLORIDE 0.9 % IV SOLN
INTRAVENOUS | Status: DC | PRN
  Administered 2019-08-17: 14:00:00 via INTRAVENOUS

## 2019-08-17 MED ORDER — PROPOFOL 10 MG/ML IV BOLUS
INTRAVENOUS | Status: DC | PRN
  Administered 2019-08-17: 80 mg via INTRAVENOUS

## 2019-08-17 NOTE — Anesthesia Postprocedure Evaluation (Addendum)
Anesthesia Post Note  Patient: [name]  Procedure(s) Performed: CARDIOVERSION (N/A )     Patient location during evaluation: Endoscopy Anesthesia Type: General Level of consciousness: sedated Pain management: pain level controlled Vital Signs Assessment: post-procedure vital signs reviewed and stable Respiratory status: spontaneous breathing and respiratory function stable Cardiovascular status: stable Postop Assessment: no apparent nausea or vomiting Anesthetic complications: no    Last Vitals:  Vitals:   08/17/19 1411 08/17/19 1424  BP: 121/72 123/70  Pulse: 65 (!) 59  Resp: (!) 24 13  Temp: 36.6 C   SpO2: 96% 94%    Last Pain:  Vitals:   08/17/19 1424  TempSrc:   PainSc: 0-No pain                 Elvi Leventhal DANIEL

## 2019-08-17 NOTE — Anesthesia Procedure Notes (Signed)
Procedure Name: General with mask airway Date/Time: 08/17/2019 2:02 PM Performed by: Alain Marion, CRNA Pre-anesthesia Checklist: Patient identified, Emergency Drugs available, Suction available, Patient being monitored and Timeout performed Patient Re-evaluated:Patient Re-evaluated prior to induction Oxygen Delivery Method: Ambu bag Preoxygenation: Pre-oxygenation with 100% oxygen Induction Type: IV induction Placement Confirmation: positive ETCO2

## 2019-08-17 NOTE — Transfer of Care (Signed)
Immediate Anesthesia Transfer of Care Note  Patient: [name]  Procedure(s) Performed: CARDIOVERSION (N/A )  Patient Location: Endoscopy Unit  Anesthesia Type:General  Level of Consciousness: awake, alert  and oriented  Airway & Oxygen Therapy: Patient Spontanous Breathing  Post-op Assessment: Report given to RN and Post -op Vital signs reviewed and stable  Post vital signs: Reviewed and stable  Last Vitals:  Vitals Value Taken Time  BP 104/73   Temp    Pulse 68 08/17/19 1412  Resp 18 08/17/19 1412  SpO2 96 % 08/17/19 1412  Vitals shown include unvalidated device data.  Last Pain:  Vitals:   08/17/19 1246  TempSrc: Oral  PainSc: 0-No pain         Complications: No apparent anesthesia complications

## 2019-08-17 NOTE — Anesthesia Preprocedure Evaluation (Signed)
Anesthesia Evaluation  Patient identified by MRN, date of birth, ID band Patient awake    Reviewed: Allergy & Precautions, NPO status , Patient's Chart, lab work & pertinent test results  History of Anesthesia Complications Negative for: history of anesthetic complications  Airway Mallampati: II  TM Distance: >3 FB Neck ROM: Full    Dental  (+) Teeth Intact   Pulmonary neg pulmonary ROS,    breath sounds clear to auscultation       Cardiovascular hypertension, + CAD  + dysrhythmias Atrial Fibrillation  Rhythm:Irregular Rate:Normal     Neuro/Psych negative neurological ROS  negative psych ROS   GI/Hepatic negative GI ROS, Neg liver ROS,   Endo/Other  negative endocrine ROS  Renal/GU Renal InsufficiencyRenal disease     Musculoskeletal   Abdominal   Peds  Hematology   Anesthesia Other Findings   Reproductive/Obstetrics                             Anesthesia Physical  Anesthesia Plan  ASA: III  Anesthesia Plan: General   Post-op Pain Management:    Induction: Intravenous  PONV Risk Score and Plan:   Airway Management Planned: Mask and Natural Airway  Additional Equipment:   Intra-op Plan:   Post-operative Plan:   Informed Consent: I have reviewed the patients History and Physical, chart, labs and discussed the procedure including the risks, benefits and alternatives for the proposed anesthesia with the patient or authorized representative who has indicated his/her understanding and acceptance.     Dental advisory given  Plan Discussed with: CRNA and Anesthesiologist  Anesthesia Plan Comments:         Anesthesia Quick Evaluation

## 2019-08-17 NOTE — CV Procedure (Signed)
    Cardioversion Note  [name] [id number] [birth year]  Procedure: DC Cardioversion Indications: Atrial fib   Procedure Details Consent: Obtained Time Out: Verified patient identification, verified procedure, site/side was marked, verified correct patient position, special equipment/implants available, Radiology Safety Procedures followed,  medications/allergies/relevent history reviewed, required imaging and test results available.  Performed  The patient has been on adequate anticoagulation.  The patient received IV  Lidocaine 100 mg followed by Propofol 80 mg IV  for sedation.  Synchronous cardioversion was performed at 200  joules.  The cardioversion was successful.    Complications: No apparent complications.  Rhythm verified by [employer] pacer rep, Leanna Sato  Patient did tolerate procedure well.   Thayer Headings, Brooke Bonito., MD, [facility] 08/17/2019, 2:18 PM

## 2019-08-17 NOTE — Interval H&P Note (Signed)
History and Physical Interval Note:  08/17/2019 1:14 PM  [name]  has presented today for surgery, with the diagnosis of ATRIAL FIBRILLATION.  The various methods of treatment have been discussed with the patient and family. After consideration of risks, benefits and other options for treatment, the patient has consented to  Procedure(s): CARDIOVERSION (N/A) as a surgical intervention.  The patient's history has been reviewed, patient examined, no change in status, stable for surgery.  I have reviewed the patient's chart and labs.  Questions were answered to the patient's satisfaction.     Mertie Moores

## 2019-09-07 ENCOUNTER — Other Ambulatory Visit: Payer: Self-pay | Admitting: Cardiovascular Disease

## 2019-09-11 ENCOUNTER — Ambulatory Visit (INDEPENDENT_AMBULATORY_CARE_PROVIDER_SITE_OTHER): Payer: Medicare Other | Admitting: *Deleted

## 2019-09-11 DIAGNOSIS — I255 Ischemic cardiomyopathy: Secondary | ICD-10-CM

## 2019-09-11 LAB — CUP PACEART REMOTE DEVICE CHECK
Battery Remaining Longevity: 17 mo
Battery Voltage: 2.92 V
Brady Statistic AP VP Percent: 94.76 %
Brady Statistic AP VS Percent: 0.08 %
Brady Statistic AS VP Percent: 5.13 %
Brady Statistic AS VS Percent: 0.03 %
Brady Statistic RA Percent Paced: 94.82 %
Brady Statistic RV Percent Paced: 99.86 %
Date Time Interrogation Session: 20210104090046
HighPow Impedance: 50 Ohm
HighPow Impedance: 66 Ohm
Implantable Lead Implant Date: 20040315
Implantable Lead Implant Date: 20100611
Implantable Lead Implant Date: 20100611
Implantable Lead Location: 753858
Implantable Lead Location: 753859
Implantable Lead Location: 753860
Implantable Lead Model: 4196
Implantable Lead Model: 5076
Implantable Lead Model: 6947
Implantable Pulse Generator Implant Date: 20160302
Lead Channel Impedance Value: 399 Ohm
Lead Channel Impedance Value: 475 Ohm
Lead Channel Impedance Value: 475 Ohm
Lead Channel Impedance Value: 532 Ohm
Lead Channel Impedance Value: 532 Ohm
Lead Channel Impedance Value: 817 Ohm
Lead Channel Pacing Threshold Amplitude: 0.5 V
Lead Channel Pacing Threshold Amplitude: 0.75 V
Lead Channel Pacing Threshold Pulse Width: 0.4 ms
Lead Channel Pacing Threshold Pulse Width: 0.4 ms
Lead Channel Sensing Intrinsic Amplitude: 11.625 mV
Lead Channel Sensing Intrinsic Amplitude: 11.625 mV
Lead Channel Sensing Intrinsic Amplitude: 2.25 mV
Lead Channel Sensing Intrinsic Amplitude: 2.25 mV
Lead Channel Setting Pacing Amplitude: 1.5 V
Lead Channel Setting Pacing Amplitude: 1.5 V
Lead Channel Setting Pacing Amplitude: 1.5 V
Lead Channel Setting Pacing Pulse Width: 0.4 ms
Lead Channel Setting Pacing Pulse Width: 1 ms
Lead Channel Setting Sensing Sensitivity: 0.3 mV

## 2019-09-12 ENCOUNTER — Telehealth: Payer: Self-pay | Admitting: Cardiovascular Disease

## 2019-09-12 NOTE — Telephone Encounter (Signed)
New Message     Pt c/o medication issue:  1. Name of Medication: sacubitril-valsartan (ENTRESTO) 24-26 MG  2. How are you currently taking this medication (dosage and times per day)? 2 x daily   3. Are you having a reaction (difficulty breathing--STAT)? No   4. What is your medication issue?  Pts daughter is calling and says the pt is having a lot of Dizziness in the mornings. They think it is from this medication     Please call

## 2019-09-12 NOTE — Telephone Encounter (Signed)
DPR on file noted.  Pt daughter reports that pt indicates he is feeling dizzy in the mornings.  She thinks it has been happening for about the last week.  She is unable to provide BP results.  If symptom continues she will callback with BP results.

## 2019-09-13 ENCOUNTER — Other Ambulatory Visit: Payer: Self-pay | Admitting: Family Medicine

## 2019-09-13 DIAGNOSIS — R42 Dizziness and giddiness: Secondary | ICD-10-CM

## 2019-09-14 ENCOUNTER — Other Ambulatory Visit (HOSPITAL_COMMUNITY): Payer: Self-pay | Admitting: Family Medicine

## 2019-09-14 ENCOUNTER — Telehealth: Payer: Self-pay | Admitting: Cardiovascular Disease

## 2019-09-14 DIAGNOSIS — R42 Dizziness and giddiness: Secondary | ICD-10-CM

## 2019-09-14 MED ORDER — VALSARTAN 40 MG PO TABS: 40.0000 mg | ORAL_TABLET | Freq: Every day | ORAL | 11 refills | Status: DC

## 2019-09-14 NOTE — Addendum Note (Signed)
Addended by: Levi Aland on: 09/14/2019 05:53 PM   Modules accepted: Orders

## 2019-09-14 NOTE — Telephone Encounter (Signed)
I received a message from [name]  his primary medical doctor, Dr. Windle Guard.    [name] continues to have episodes of lightheadedness and orthostasis since he has been on Entresto.  He is currently on spironolactone.  He is not on furosemide.  We will reduce his spironolactone to 12.5 mg a day and see if this helps with his symptoms.  We can also try holding the spironolactone completely to see if that resolves his issues.  I would like for him to stay on Entresto if at all possible.  If he simply does not tolerate the Entresto, will change back to losartan or another ARB.    Kristeen Miss, MD  09/14/2019 10:16 AM    [facility] [address] [place],  [address] [place], [place]  [address] Phone: [phone]; Fax: [phone]

## 2019-09-14 NOTE — Telephone Encounter (Signed)
May need to change back to ACEi or ARB therapy. Pt is already on lowest dose of Entresto and tablets cannot be cut in half due to uneven distribution of sacubitril and valsartan. Unsure of benefit he would receive by taking 1 tablet only once daily.   Could potentially consider changing low dose carvedilol to low dose Toprol 12.5mg  daily- may have less of an effect on his BP and could potentially allow him to tolerate low dose Entresto twice daily a bit better.

## 2019-09-14 NOTE — Telephone Encounter (Signed)
Called patient to review Dr. Harvie Bridge advice. He states Dr. Jeannetta Nap told him to reduce Entresto to 24-26 mg to once daily on Tuesday. He states he already feels less unsteady on his feet. His current dose of spironolactone is 12.5 mg daily. I discussed with Dr. Elease Hashimoto who advised that patient d/c spironolactone and take Entresto 24-26 mg twice daily. Patient states he feels that the [facility] definitely caused his unsteadiness particularly when he gets out of bed in the mornings. He states he almost fell to the ground on multiple occasions and he has not felt that way since he decreased Entresto. States BP readings have been 120's mmHg systolic and 70's mmHg diastolic. I advised that I will discuss whether this dose has benefit equivalent with ACE-I or ARB therapy (patient previously took lisinopril) with our PharmD and Dr. Elease Hashimoto and call him back with their advice. He verbalized understanding and agreement and thanked me for the call.

## 2019-09-14 NOTE — Telephone Encounter (Signed)
Called patient to discuss medication therapy. Patient feels strongly that [name] is causing him to be unsteady on his feet in the morning. I discussed with Dr. Elease Hashimoto who agrees with plan for patient to d/c Entresto and start Valsartan 40 mg daily. Patient verbalized understanding and repeated the plan back to me. I advised him to call back with questions or concerns. He states he tried to call us last week and was 35th, 41st caller on the line. I apologized and asked him to try again in the future if he needs advice and advised that I will also try to call him early next week. He verbalized understanding and agreement and thanked me for the call.

## 2019-09-15 ENCOUNTER — Telehealth: Payer: Self-pay

## 2019-09-15 NOTE — Telephone Encounter (Signed)
The pt states he sent in a transmission 09-11-2019 and did not hear from [place]. Pt states he had a cardioversion and wanted to hear from [place]. Per [name], Rn the transmission did not show any A-fib. The pt states we do not communicate with Dr. Elease Hashimoto about the transmissions. I told him once Dr. Graciela Husbands looks at the transmissions the transmissions are uploaded to Epic for Dr. Elease Hashimoto to review. I let him know the transmission been in epic since 09-11-2019 for dr. Elease Hashimoto to review as well. The pt said thank you and hung up the phone.

## 2019-09-19 NOTE — Telephone Encounter (Signed)
Called patient to see how he is feeling since we changed his medications. He reports he feels well and has not had any episodes of dizziness or lightheadedness. He denies concerns and agrees to continue current medications. He is aware of the dates and times of upcoming appointments and he thanked me for calling him.

## 2019-09-22 ENCOUNTER — Ambulatory Visit: Payer: Medicare Other | Admitting: Cardiovascular Disease

## 2019-10-23 ENCOUNTER — Other Ambulatory Visit: Payer: Self-pay | Admitting: Cardiovascular Disease

## 2019-10-23 MED ORDER — VALSARTAN 40 MG PO TABS: 40.0000 mg | ORAL_TABLET | Freq: Every day | ORAL | 2 refills | Status: DC

## 2019-10-23 MED ORDER — AMIODARONE HCL 200 MG PO TABS: 200.0000 mg | ORAL_TABLET | Freq: Every day | ORAL | 2 refills | Status: DC

## 2019-10-23 MED ORDER — APIXABAN 5 MG PO TABS: 5.0000 mg | ORAL_TABLET | Freq: Two times a day (BID) | ORAL | 1 refills | Status: DC

## 2019-10-23 NOTE — Telephone Encounter (Signed)
[facility] mail order pharmacy is requesting a refill on Allopurinol. Would Dr. Elease Hashimoto like to refill this medication? Please address

## 2019-10-23 NOTE — Telephone Encounter (Signed)
Pt last saw Dr Elease Hashimoto 08/07/19, last labs 08/07/19 Creat 1.27, age 79, weight 97.5kg, based on specified criteria pt is on appropriate dosage of Eliquis 5mg  BID.  Will refill rx.

## 2019-10-23 NOTE — Telephone Encounter (Signed)
Pt needed his Eliquis sent to [facility] order pharmacy. Please address

## 2019-10-31 ENCOUNTER — Other Ambulatory Visit: Payer: Self-pay

## 2019-10-31 MED ORDER — APIXABAN 5 MG PO TABS: 5.0000 mg | ORAL_TABLET | Freq: Two times a day (BID) | ORAL | 1 refills | Status: DC

## 2019-10-31 MED ORDER — VALSARTAN 40 MG PO TABS: 40.0000 mg | ORAL_TABLET | Freq: Every day | ORAL | 2 refills | Status: DC

## 2019-10-31 MED ORDER — AMIODARONE HCL 200 MG PO TABS: 200.0000 mg | ORAL_TABLET | Freq: Every day | ORAL | 2 refills | Status: DC

## 2019-10-31 NOTE — Telephone Encounter (Signed)
Eliquis 5mg  refill request received. Pt is 79yrs old, weight-97.5kg, Crea-1.27 on 08/07/2019, Diagnosis-Afib, and last seen by  Dr. 08/09/2019 on 08/07/2019. Dose is appropriate based on dosing criteria. Will send in refill to requested pharmacy.

## 2019-11-01 ENCOUNTER — Other Ambulatory Visit: Payer: Medicare PPO | Admitting: *Deleted

## 2019-11-01 ENCOUNTER — Other Ambulatory Visit: Payer: Self-pay | Admitting: *Deleted

## 2019-11-01 ENCOUNTER — Other Ambulatory Visit: Payer: Self-pay

## 2019-11-01 ENCOUNTER — Ambulatory Visit: Payer: Medicare Other | Admitting: Physician Assistant

## 2019-11-01 DIAGNOSIS — E785 Hyperlipidemia, unspecified: Secondary | ICD-10-CM

## 2019-11-01 DIAGNOSIS — I5022 Chronic systolic (congestive) heart failure: Secondary | ICD-10-CM

## 2019-11-01 LAB — BASIC METABOLIC PANEL
BUN/Creatinine Ratio: 22 (ref 10–24)
BUN: 26 mg/dL (ref 8–27)
CO2: 22 mmol/L (ref 20–29)
Calcium: 9.2 mg/dL (ref 8.6–10.2)
Chloride: 102 mmol/L (ref 96–106)
Creatinine, Ser: 1.18 mg/dL (ref 0.76–1.27)
GFR calc Af Amer: 67 mL/min/{1.73_m2} (ref >59)
GFR calc non Af Amer: 58 mL/min/{1.73_m2} — ABNORMAL LOW (ref >59)
Glucose: 128 mg/dL — ABNORMAL HIGH (ref 65–99)
Potassium: 4.9 mmol/L (ref 3.5–5.2)
Sodium: 139 mmol/L (ref 134–144)

## 2019-11-01 LAB — HEPATIC FUNCTION PANEL
ALT: 19 IU/L (ref 0–44)
AST: 25 IU/L (ref 0–40)
Albumin: 4.1 g/dL (ref 3.7–4.7)
Alkaline Phosphatase: 62 IU/L (ref 39–117)
Bilirubin Total: 0.8 mg/dL (ref 0.0–1.2)
Bilirubin, Direct: 0.25 mg/dL (ref 0.00–0.40)
Total Protein: 6.6 g/dL (ref 6.0–8.5)

## 2019-11-01 LAB — LIPID PANEL
Chol/HDL Ratio: 2.8 ratio (ref 0.0–5.0)
Cholesterol, Total: 115 mg/dL (ref 100–199)
HDL: 41 mg/dL (ref >39)
LDL Chol Calc (NIH): 59 mg/dL (ref 0–99)
Triglycerides: 74 mg/dL (ref 0–149)
VLDL Cholesterol Cal: 15 mg/dL (ref 5–40)
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
## 2019-11-07 ENCOUNTER — Other Ambulatory Visit: Payer: Self-pay

## 2019-11-07 ENCOUNTER — Ambulatory Visit: Payer: Medicare PPO | Admitting: Cardiovascular Disease

## 2019-11-07 ENCOUNTER — Encounter: Payer: Self-pay | Admitting: Cardiovascular Disease

## 2019-11-07 VITALS — BP 100/58 | HR 64 | Ht 71.0 in | Wt 218.5 lb

## 2019-11-07 DIAGNOSIS — I5022 Chronic systolic (congestive) heart failure: Secondary | ICD-10-CM

## 2019-11-07 DIAGNOSIS — Z95810 Presence of automatic (implantable) cardiac defibrillator: Secondary | ICD-10-CM

## 2019-11-07 DIAGNOSIS — I255 Ischemic cardiomyopathy: Secondary | ICD-10-CM

## 2019-11-07 DIAGNOSIS — I4819 Other persistent atrial fibrillation: Secondary | ICD-10-CM | POA: Diagnosis not present

## 2019-11-07 NOTE — Patient Instructions (Signed)
Medication Instructions:  Your physician has recommended you make the following change in your medication:  STOP Spironolactone (Aldactone)  *If you need a refill on your cardiac medications before your next appointment, please call your pharmacy*   Lab Work: None Ordered If you have labs (blood work) drawn today and your tests are completely normal, you will receive your results only by: [name] MyChart Message (if you have MyChart) OR . A paper copy in the mail If you have any lab test that is abnormal or we need to change your treatment, we will call you to review the results.   Testing/Procedures: Your physician has requested that you have an echocardiogram. Echocardiography is a painless test that uses sound waves to create images of your heart. It provides your doctor with information about the size and shape of your heart and how well your heart's chambers and valves are working. This procedure takes approximately one hour. There are no restrictions for this procedure.     Follow-Up: At [facility], you and your health needs are our priority.  As part of our continuing mission to provide you with exceptional heart care, we have created designated Provider Care Teams.  These Care Teams include your primary Cardiologist (physician) and Advanced Practice Providers (APPs -  Physician Assistants and Nurse Practitioners) who all work together to provide you with the care you need, when you need it.  We recommend signing up for the patient portal called "MyChart".  Sign up information is provided on this After Visit Summary.  MyChart is used to connect with patients for Virtual Visits (Telemedicine).  Patients are able to view lab/test results, encounter notes, upcoming appointments, etc.  Non-urgent messages can be sent to your provider as well.   To learn more about what you can do with MyChart, go to [email or web address].    Your next appointment:   3 month(s)  The format for your  next appointment:   In Person  Provider:   Tereso Newcomer, PA-C

## 2019-11-08 ENCOUNTER — Other Ambulatory Visit: Payer: Self-pay | Admitting: Cardiovascular Disease

## 2019-11-08 NOTE — Telephone Encounter (Signed)
Eliquis 5mg  refill request received, pt is 79yrs old, weight-99.1kg, Crea-1.18 on 11/01/2019, Diagnosis-Afib, and last seen by Dr. 11/03/2019 on 11/08/2019. Dose is appropriate based on dosing criteria. Will send in refill to requested pharmacy.

## 2019-11-23 ENCOUNTER — Other Ambulatory Visit: Payer: Self-pay

## 2019-11-23 ENCOUNTER — Ambulatory Visit (HOSPITAL_COMMUNITY): Payer: Medicare PPO | Attending: Cardiovascular Disease

## 2019-11-23 DIAGNOSIS — I5022 Chronic systolic (congestive) heart failure: Secondary | ICD-10-CM

## 2019-11-23 DIAGNOSIS — I4819 Other persistent atrial fibrillation: Secondary | ICD-10-CM

## 2019-11-23 DIAGNOSIS — I255 Ischemic cardiomyopathy: Secondary | ICD-10-CM | POA: Diagnosis not present

## 2019-11-23 DIAGNOSIS — Z95810 Presence of automatic (implantable) cardiac defibrillator: Secondary | ICD-10-CM

## 2019-11-23 MED ORDER — PERFLUTREN LIPID MICROSPHERE
1.0000 mL | INTRAVENOUS | Status: AC | PRN
  Administered 2019-11-23: 3 mL via INTRAVENOUS

## 2019-12-01 ENCOUNTER — Ambulatory Visit
Admission: RE | Admit: 2019-12-01 | Discharge: 2019-12-01 | Disposition: A | Discharge status: Home / Self Care | Payer: Medicare PPO | Source: Ambulatory Visit | Attending: Family Medicine | Admitting: Family Medicine

## 2019-12-01 ENCOUNTER — Other Ambulatory Visit: Payer: Self-pay | Admitting: Family Medicine

## 2019-12-01 ENCOUNTER — Other Ambulatory Visit: Payer: Self-pay

## 2019-12-01 DIAGNOSIS — R1011 Right upper quadrant pain: Secondary | ICD-10-CM

## 2019-12-01 MED ORDER — IOPAMIDOL (ISOVUE-300) INJECTION 61%
100.0000 mL | Freq: Once | INTRAVENOUS | Status: AC | PRN
  Administered 2019-12-01: 100 mL via INTRAVENOUS

## 2019-12-11 ENCOUNTER — Ambulatory Visit (INDEPENDENT_AMBULATORY_CARE_PROVIDER_SITE_OTHER): Payer: Medicare PPO | Admitting: *Deleted

## 2019-12-11 ENCOUNTER — Telehealth: Payer: Self-pay

## 2019-12-11 DIAGNOSIS — I255 Ischemic cardiomyopathy: Secondary | ICD-10-CM | POA: Diagnosis not present

## 2019-12-11 LAB — CUP PACEART REMOTE DEVICE CHECK
Battery Remaining Longevity: 19 mo
Battery Voltage: 2.92 V
Brady Statistic AP VP Percent: 98.57 %
Brady Statistic AP VS Percent: 0.05 %
Brady Statistic AS VP Percent: 1.37 %
Brady Statistic AS VS Percent: 0 %
Brady Statistic RA Percent Paced: 98.62 %
Brady Statistic RV Percent Paced: 99.88 %
Date Time Interrogation Session: 20210405090238
HighPow Impedance: 45 Ohm
HighPow Impedance: 60 Ohm
Implantable Lead Implant Date: 20040315
Implantable Lead Implant Date: 20100611
Implantable Lead Implant Date: 20100611
Implantable Lead Location: 753858
Implantable Lead Location: 753859
Implantable Lead Location: 753860
Implantable Lead Model: 4196
Implantable Lead Model: 5076
Implantable Lead Model: 6947
Implantable Pulse Generator Implant Date: 20160302
Lead Channel Impedance Value: 342 Ohm
Lead Channel Impedance Value: 456 Ohm
Lead Channel Impedance Value: 456 Ohm
Lead Channel Impedance Value: 456 Ohm
Lead Channel Impedance Value: 475 Ohm
Lead Channel Impedance Value: 665 Ohm
Lead Channel Pacing Threshold Amplitude: 0.625 V
Lead Channel Pacing Threshold Amplitude: 0.625 V
Lead Channel Pacing Threshold Pulse Width: 0.4 ms
Lead Channel Pacing Threshold Pulse Width: 0.4 ms
Lead Channel Sensing Intrinsic Amplitude: 1.875 mV
Lead Channel Sensing Intrinsic Amplitude: 1.875 mV
Lead Channel Sensing Intrinsic Amplitude: 11.625 mV
Lead Channel Sensing Intrinsic Amplitude: 11.625 mV
Lead Channel Setting Pacing Amplitude: 1.5 V
Lead Channel Setting Pacing Amplitude: 1.5 V
Lead Channel Setting Pacing Amplitude: 1.5 V
Lead Channel Setting Pacing Pulse Width: 0.4 ms
Lead Channel Setting Pacing Pulse Width: 1 ms
Lead Channel Setting Sensing Sensitivity: 0.3 mV

## 2019-12-11 MED ORDER — POTASSIUM CHLORIDE CRYS ER 20 MEQ PO TBCR: 20.0000 meq | EXTENDED_RELEASE_TABLET | Freq: Every day | ORAL | 11 refills | Status: DC

## 2019-12-11 MED ORDER — FUROSEMIDE 40 MG PO TABS: 40.0000 mg | ORAL_TABLET | Freq: Every day | ORAL | 11 refills | Status: DC

## 2019-12-11 NOTE — Telephone Encounter (Signed)
ICD tranmission received, noted elevated optivol since 3/11.  Pt with history of CHF followed by Dr. Elease Hashimoto.  Pt dies not have prescribed diuretic.    Spoke with pt, he reports he has had increased SOB especially when sleeping.  He also has had some weight gain.  Pt confirmed that he does not have a diuretic avail.  Stress importance of watching sodium in diet and advised would forward info to Dr. Elease Hashimoto for management   Explained ICM clinic, pt agreeable.

## 2019-12-11 NOTE — Telephone Encounter (Signed)
Called patient and reviewed Dr. Harvie Bridge advice with him. He verbalized understanding to start furosemide 40 mg and kdur 20 meq once daily. I asked about timing for starting medication today and he reports that he usually goes to bed at 9pm. I advised that he will want to take the furosemide before 3 pm today so that it doesn't keep him up all night. He states he will either start today or tomorrow depending on what time he gets to the pharmacy. He is aware I will call him back Thursday to see how he is feeling. He verbalized understanding and agreement with plan and thanked me for the call.

## 2019-12-11 NOTE — Telephone Encounter (Signed)
OK to start Lasix 40 mg a day  and Kdur 20 meq for 2 days Have him resend Optivol readings next week

## 2019-12-12 ENCOUNTER — Encounter: Payer: Self-pay | Admitting: Cardiovascular Disease

## 2019-12-12 ENCOUNTER — Telehealth: Payer: Self-pay | Admitting: Cardiovascular Disease

## 2019-12-12 DIAGNOSIS — I4819 Other persistent atrial fibrillation: Secondary | ICD-10-CM

## 2019-12-12 MED ORDER — NITROGLYCERIN 0.4 MG SL SUBL: 0.4000 mg | SUBLINGUAL_TABLET | SUBLINGUAL | 3 refills | Status: DC | PRN

## 2019-12-12 NOTE — Progress Notes (Signed)
ICD Remote  

## 2019-12-12 NOTE — Addendum Note (Signed)
Addended by: Theresia Majors on: 12/12/2019 03:24 PM   Modules accepted: Orders

## 2019-12-12 NOTE — Telephone Encounter (Signed)
Pt c/o of Chest Pain: STAT if CP now or developed within 24 hours  1. Are you having CP right now? Patient states he feels CP now, however the medication he took helped relieve some of the pain   2. Are you experiencing any other symptoms (ex. SOB, nausea, vomiting, sweating)? Per patient, sometimes at night  3. How long have you been experiencing CP? 2 weeks  4. Is your CP continuous or coming and going? Coming and going, mainly at night  5. Have you taken Nitroglycerin? No?

## 2019-12-12 NOTE — Telephone Encounter (Signed)
I have several openings tomorrow. I can see him tomorrow.   He commented that he does not have NTG to take at home.  Please refill his NTG  If he is having cp currently , he should go to the ER.

## 2019-12-12 NOTE — Telephone Encounter (Signed)
Spoke with patient and made him an appointment to see Dr. Elease Hashimoto tomorrow at 8:20am. Rx has been sent in for nitroglycerin. Patient is aware of how to take it. Is chest pain returns he will go to the ER.
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
## 2019-12-12 NOTE — Telephone Encounter (Signed)
Spoke with patient who states that he has been having chest pain at night for about 2 weeks now. He reports difficulty breathing during the night as well. He denies having chest pain currently.  He states that yesterday he started taking Lasix and Kdur and reports being up all night to use the restroom and having nightmares. Patient states that he took the medications at 3pm, I advised him that he could take them earlier in the day than that just no later than 3pm.  Patient also reports that he is dizzy and lightheaded when he gets up in the morning. Patient did not report any BP or HR readings. Patient advised to go to the ER if chest pain and associated symptoms return. He does not have any nitroglycerin. Advised that I would forward information to Dr. Elease Hashimoto.

## 2019-12-13 ENCOUNTER — Telehealth (HOSPITAL_COMMUNITY): Payer: Self-pay | Admitting: *Deleted

## 2019-12-13 ENCOUNTER — Encounter: Payer: Self-pay | Admitting: Cardiovascular Disease

## 2019-12-13 ENCOUNTER — Ambulatory Visit (INDEPENDENT_AMBULATORY_CARE_PROVIDER_SITE_OTHER): Payer: Medicare PPO | Admitting: *Deleted

## 2019-12-13 ENCOUNTER — Ambulatory Visit (INDEPENDENT_AMBULATORY_CARE_PROVIDER_SITE_OTHER): Payer: Medicare PPO | Admitting: Cardiovascular Disease

## 2019-12-13 ENCOUNTER — Other Ambulatory Visit: Payer: Self-pay

## 2019-12-13 VITALS — BP 84/54 | HR 65 | Ht 71.0 in | Wt 222.5 lb

## 2019-12-13 DIAGNOSIS — Z95810 Presence of automatic (implantable) cardiac defibrillator: Secondary | ICD-10-CM

## 2019-12-13 DIAGNOSIS — I4819 Other persistent atrial fibrillation: Secondary | ICD-10-CM

## 2019-12-13 DIAGNOSIS — I509 Heart failure, unspecified: Secondary | ICD-10-CM

## 2019-12-13 DIAGNOSIS — I5022 Chronic systolic (congestive) heart failure: Secondary | ICD-10-CM

## 2019-12-13 DIAGNOSIS — I255 Ischemic cardiomyopathy: Secondary | ICD-10-CM | POA: Diagnosis not present

## 2019-12-13 DIAGNOSIS — R079 Chest pain, unspecified: Secondary | ICD-10-CM

## 2019-12-13 DIAGNOSIS — I959 Hypotension, unspecified: Secondary | ICD-10-CM | POA: Diagnosis not present

## 2019-12-13 LAB — CUP PACEART INCLINIC DEVICE CHECK
Battery Remaining Longevity: 19 mo
Battery Voltage: 2.92 V
Brady Statistic AP VP Percent: 97.77 %
Brady Statistic AP VS Percent: 0.06 %
Brady Statistic AS VP Percent: 2.16 %
Brady Statistic AS VS Percent: 0.01 %
Brady Statistic RA Percent Paced: 97.82 %
Brady Statistic RV Percent Paced: 99.87 %
Date Time Interrogation Session: 20210407190405
HighPow Impedance: 50 Ohm
HighPow Impedance: 67 Ohm
Implantable Lead Implant Date: 20040315
Implantable Lead Implant Date: 20100611
Implantable Lead Implant Date: 20100611
Implantable Lead Location: 753858
Implantable Lead Location: 753859
Implantable Lead Location: 753860
Implantable Lead Model: 4196
Implantable Lead Model: 5076
Implantable Lead Model: 6947
Implantable Pulse Generator Implant Date: 20160302
Lead Channel Impedance Value: 361 Ohm
Lead Channel Impedance Value: 456 Ohm
Lead Channel Impedance Value: 456 Ohm
Lead Channel Impedance Value: 475 Ohm
Lead Channel Impedance Value: 513 Ohm
Lead Channel Impedance Value: 722 Ohm
Lead Channel Pacing Threshold Amplitude: 0.5 V
Lead Channel Pacing Threshold Amplitude: 0.75 V
Lead Channel Pacing Threshold Amplitude: 2 V
Lead Channel Pacing Threshold Pulse Width: 0.4 ms
Lead Channel Pacing Threshold Pulse Width: 0.4 ms
Lead Channel Pacing Threshold Pulse Width: 1 ms
Lead Channel Sensing Intrinsic Amplitude: 1.125 mV
Lead Channel Sensing Intrinsic Amplitude: 17.375 mV
Lead Channel Setting Pacing Amplitude: 1.5 V
Lead Channel Setting Pacing Amplitude: 1.5 V
Lead Channel Setting Pacing Amplitude: 3 V
Lead Channel Setting Pacing Pulse Width: 0.4 ms
Lead Channel Setting Pacing Pulse Width: 1 ms
Lead Channel Setting Sensing Sensitivity: 0.3 mV

## 2019-12-13 NOTE — Telephone Encounter (Signed)
Patient given detailed instructions per Myocardial Perfusion Study Information Sheet for the test on 12/20/2019 at 0745. Patient notified to arrive 15 minutes early and that it is imperative to arrive on time for appointment to keep from having the test rescheduled.  If you need to cancel or reschedule your appointment, please call the office within 24 hours of your appointment. . Patient verbalized understanding.[name], [name] Patient does not use the [employer]

## 2019-12-13 NOTE — Progress Notes (Signed)
CRT-D device check in office, added-on per Dr. Elease Hashimoto. RA and RV thresholds and sensing consistent with previous device measurements. LV threshold now 2.0V @ 1.75mV, presented with output programmed at 1.5V @ 1.67ms. Increased LV output to 3.0V with improvement in QRS duration. Lead impedance trends stable over time. 1 AT/AF episode (<0.1%), maintaining SR since DCCV in 08/2019. No ventricular arrhythmia episodes recorded. Patient bi-ventricularly pacing 99.9% of the time. Device programmed with appropriate safety margins. Heart failure diagnostics suggest ongoing fluid accumulation since 11/16/19. Estimated longevity 19 months. Patient enrolled in remote follow up. [facility] on 03/12/20 and ROV with Dr. Graciela Husbands next available (overdue since 06/2019).

## 2019-12-13 NOTE — Patient Instructions (Addendum)
Medication Instructions:  Your physician has recommended you make the following change in your medication:  1-STOP Furosemide (Lasix) 2-STOP Potassium (Kdur) 3-STOP Diovan  *If you need a refill on your cardiac medications before your next appointment, please call your pharmacy*  Lab Work: If you have labs (blood work) drawn today and your tests are completely normal, you will receive your results only by: [name] MyChart Message (if you have MyChart) OR . A paper copy in the mail If you have any lab test that is abnormal or we need to change your treatment, we will call you to review the results.  Testing/Procedures: Your physician has requested that you have a lexiscan myoview. For further information please visit [email or web address] Please follow instruction sheet, as given.  Follow-Up: At [facility], you and your health needs are our priority.  As part of our continuing mission to provide you with exceptional heart care, we have created designated Provider Care Teams.  These Care Teams include your primary Cardiologist (physician) and Advanced Practice Providers (APPs -  Physician Assistants and Nurse Practitioners) who all work together to provide you with the care you need, when you need it.  We recommend signing up for the patient portal called "MyChart".  Sign up information is provided on this After Visit Summary.  MyChart is used to connect with patients for Virtual Visits (Telemedicine).  Patients are able to view lab/test results, encounter notes, upcoming appointments, etc.  Non-urgent messages can be sent to your provider as well.   To learn more about what you can do with MyChart, go to [email or web address].    Your next appointment:   2 months already scheduled  The format for your next appointment:   Either In Person or Virtual  Provider:   You may see Kristeen Miss, MD or one of the following Advanced Practice Providers on your designated Care Team:    Tereso Newcomer, PA-C  Vin [place], [place]  [place], [place]

## 2019-12-14 NOTE — Telephone Encounter (Signed)
Patient was seen by Dr. Elease Hashimoto in the office on 4/7 for worsening symptoms.

## 2019-12-15 ENCOUNTER — Telehealth: Payer: Self-pay

## 2019-12-15 NOTE — Telephone Encounter (Signed)
Patient returned call.  Advised Dr Elease Hashimoto recommended to recheck fluid levels per remote transmission report next week.  He is able to send remote transmission without assistance and he agreed to send remote transmission on 4/13 for review.

## 2019-12-15 NOTE — Telephone Encounter (Signed)
Patient referred to [facility] clinic by Judy Pimple, RN device clinic due to abnormal Optivol suggesting fluid accumulation on 12/11/2019 and had follow up visit with Dr Elease Hashimoto 12/13/19 for symptom evaluation.  Dr Elease Hashimoto recommended Optivol recheck and will assist patient with manual send on 12/19/2019 if patient is reached.    Attempted ICM intro call to patient today and left message for return call.

## 2019-12-19 ENCOUNTER — Ambulatory Visit (INDEPENDENT_AMBULATORY_CARE_PROVIDER_SITE_OTHER): Payer: Medicare PPO

## 2019-12-19 DIAGNOSIS — I5022 Chronic systolic (congestive) heart failure: Secondary | ICD-10-CM

## 2019-12-19 DIAGNOSIS — Z95810 Presence of automatic (implantable) cardiac defibrillator: Secondary | ICD-10-CM

## 2019-12-19 NOTE — Progress Notes (Signed)
EPIC Encounter for ICM Monitoring  Patient Name: [name] is a 79 y.o. male Date: 12/19/2019 Primary Care Physican: Kaleen Mask, MD Primary Cardiologist: Nahser Electrophysiologist: Joycelyn Schmid Pacing: 99.9 %  Weight:  unknown       Patient referred to [facility] clinic by Judy Pimple, Device clinic RN due to abnormal Optivol that was addressed by Dr Elease Hashimoto.  Spoke with patient and he reports he is doing fine.    Optivol thoracic impedance returned to normal after taking furosemide 40 mg and kdur 20 meq once daily as instructed by Dr Harvie Bridge nurse on 12/11/2019 (see phone note)  Prescribed: No long term diuretic in medication list.  Recommendations: No changes and encouraged to call if experiencing any fluid symptoms.  Follow-up plan: ICM clinic phone appointment on 01/22/2020.   91 day device clinic remote transmission 03/12/2020.  Office appt 12/25/2019 with Francis Dowse, PA and Tereso Newcomer, [place] on 02/06/2020.    Copy of ICM check sent to Dr. Graciela Husbands and Dr Elease Hashimoto to show effectiveness of temporary Furosemide dosage.   3 month ICM trend: 12/19/2019    1 Year ICM trend:       Karie Soda, RN 12/19/2019 2:03 PM

## 2019-12-20 ENCOUNTER — Other Ambulatory Visit: Payer: Self-pay

## 2019-12-20 ENCOUNTER — Ambulatory Visit (HOSPITAL_COMMUNITY): Payer: Medicare PPO | Attending: Cardiology

## 2019-12-20 DIAGNOSIS — I509 Heart failure, unspecified: Secondary | ICD-10-CM | POA: Insufficient documentation

## 2019-12-20 DIAGNOSIS — I959 Hypotension, unspecified: Secondary | ICD-10-CM | POA: Insufficient documentation

## 2019-12-20 DIAGNOSIS — R079 Chest pain, unspecified: Secondary | ICD-10-CM | POA: Diagnosis not present

## 2019-12-20 LAB — MYOCARDIAL PERFUSION IMAGING
LV dias vol: 203 mL (ref 62–150)
LV sys vol: 157 mL
Peak HR: 60 {beats}/min
Rest HR: 60 {beats}/min
SDS: 1
SRS: 29
SSS: 30
TID: 0.93

## 2019-12-20 MED ORDER — TECHNETIUM TC 99M TETROFOSMIN IV KIT
10.2000 | PACK | Freq: Once | INTRAVENOUS | Status: AC | PRN
  Administered 2019-12-20: 10.2 via INTRAVENOUS
  Filled 2019-12-20: qty 11

## 2019-12-20 MED ORDER — TECHNETIUM TC 99M TETROFOSMIN IV KIT
32.7000 | PACK | Freq: Once | INTRAVENOUS | Status: AC | PRN
  Administered 2019-12-20: 32.7 via INTRAVENOUS
  Filled 2019-12-20: qty 33

## 2019-12-20 MED ORDER — REGADENOSON 0.4 MG/5ML IV SOLN
0.4000 mg | Freq: Once | INTRAVENOUS | Status: AC
  Administered 2019-12-20: 0.4 mg via INTRAVENOUS

## 2019-12-23 NOTE — Progress Notes (Signed)
Cardiology Office Note Date:  12/25/2019  Patient ID:  James, [name] 09-Dec-1940, MRN [id number] PCP:  Kaleen Mask, MD  Cardiologist:  Dr. Elease Hashimoto EP: Dr. Graciela Husbands    Chief Complaint: over due device visit  History of Present Illness: [name] is a 79 y.o. male with history of CAD, chronic CHF (systolic), ICD, BPH,  HLD, gout, AFib.  He comes in today to be seen for Dr. Graciela Husbands.  Last seen by him Oct 2019.  He was doing well, no changes were made.  Most recently she saw Dr. Elease Hashimoto 12/13/19. He had a fall recently was sore from this.  ICM clinic noted marked volume OL via his device and started on diuretic tx.  Noting lower BP since. His device was checked this visit noting his LV lead thresholds had risen and outputs were subthreshold.  Reprogrammed LV lead outputs.  EKG noted marked improvement in his QRS duration. He was planned for stress testing to evaluate some indigestion like CP,  His stress test had scar no evidence of ischemia, LVEF 23%  He is feeling well.  Very concerned about the wide reanding BP's at his last couple checks.  Normally at home his BP is 120's-130 range, and felt very weak and unsteady when it got so low before.  Worries now that his BP was so high today.  Physically though he feels like he is doing well.  He denies rest SOB, no symptoms of PND or orthopnea, says he sleeps well.  He denies any CP or SOB/DOE with his ADLs, does his grocery shopping without difficulty. No dizzy spells, near syncope or syncope. No palpitations Reports compliance with his medicines Mentions his BP got too low with the furosemide as well as the Entresto, both made him feel pretty awful   Device information MDT CRT-D, gen change 11/07/2014 RA lead 2010 RV lead 2004 LV lead 2011   Past Medical History:  Diagnosis Date  . Arthritis    knees, back   . BPH (benign prostatic hypertrophy)   . Chronic kidney disease    BPH  . Chronic systolic heart failure (HCC)     a. s/p MDT single chamber ICD 2004 as part of MASTER study b. upgrade to CRTD 2010; CRTD gen change 2016  . Coronary artery disease    a. s/p anterior MI and CYPHER stent to LAD 2005  . Dyslipidemia   . Gout   . Ischemic cardiomyopathy   . Paroxysmal atrial fibrillation (HCC)   . Ventricular tachycardia [facility])     Past Surgical History:  Procedure Laterality Date  . APPENDECTOMY    . BI-VENTRICULAR IMPLANTABLE CARDIOVERTER DEFIBRILLATOR UPGRADE N/A 11/07/2014   a. MDT single chamber ICD implanted 2004 as part of MASTER study; upgrade to CRTD 2010; gen change 2016  . CARDIAC CATHETERIZATION  2005  . CARDIOVERSION N/A 07/04/2019   Procedure: CARDIOVERSION;  Surgeon: Wendall Stade, MD;  Location: [facility] ENDOSCOPY;  Service: Cardiovascular;  Laterality: N/A;  . CARDIOVERSION N/A 08/17/2019   Procedure: CARDIOVERSION;  Surgeon: Vesta Mixer, MD;  Location: [facility] ENDOSCOPY;  Service: Cardiovascular;  Laterality: N/A;  . CORONARY ANGIOPLASTY     5 stents   . KNEE ARTHROSCOPY    . OTHER SURGICAL HISTORY     right ear surgery as a child   . PROSTATECTOMY  11/06/2011   Procedure: PROSTATECTOMY SUPRAPUBIC;  Surgeon: Kathi Ludwig, MD;  Location: [facility] ORS;  Service: Urology;  Laterality: N/A;  Open Suprapubic  Prostatectomy  . TEE WITHOUT CARDIOVERSION N/A 05/16/2019   Procedure: TRANSESOPHAGEAL ECHOCARDIOGRAM (TEE);  Surgeon: Sande Rives, MD;  Location: [facility] ENDOSCOPY;  Service: Cardiology;  Laterality: N/A;    Current Outpatient Medications  Medication Sig Dispense Refill  . allopurinol (ZYLOPRIM) 100 MG tablet Take 100 mg by mouth daily.    [name] amiodarone (PACERONE) 200 MG tablet Take 1 tablet (200 mg total) by mouth daily. 90 tablet 2  . carvedilol (COREG) 3.125 MG tablet Take 1 tablet (3.125 mg total) by mouth 2 (two) times daily. 180 tablet 3  . cyclobenzaprine (FLEXERIL) 10 MG tablet Take 1 tablet by mouth as needed.    [name] ELIQUIS 5 MG TABS tablet TAKE 1 TABLET BY MOUTH  TWICE  DAILY 180 tablet 3  . latanoprost (XALATAN) 0.005 % ophthalmic solution Place 1 drop into both eyes at bedtime.     . naproxen sodium (ALEVE) 220 MG tablet Take 220 mg by mouth daily as needed (pain).    . nitroGLYCERIN (NITROSTAT) 0.4 MG SL tablet Place 1 tablet (0.4 mg total) under the tongue every 5 (five) minutes as needed for chest pain. 30 tablet 3  . Polyethyl Glycol-Propyl Glycol 0.4-0.3 % SOLN Place 1 drop into both eyes 2 (two) times daily.     . rosuvastatin (CRESTOR) 10 MG tablet Take 5 mg by mouth daily.    . TURMERIC PO Take 538 mg by mouth daily.      No current facility-administered medications for this visit.    Allergies:   Atorvastatin   Social History:  The patient  reports that he has never smoked. He has never used smokeless tobacco. He reports that he does not drink alcohol or use drugs.   Family History:  The patient's family history includes Heart disease in his brother, brother, sister, and sister.  ROS:  Please see the history of present illness.  All other systems are reviewed and otherwise negative.   PHYSICAL EXAM:  VS:  BP 140/82   Pulse 84   Ht 5\' 11"  (1.803 m)   Wt 227 lb (103 kg)   BMI 31.66 kg/m  BMI: Body mass index is 31.66 kg/m. Well nourished, well developed, in no acute distress  HEENT: normocephalic, atraumatic  Neck: no JVD, carotid bruits or masses Cardiac:  RRR; no significant murmurs, no rubs, or gallops Lungs:  CTA b/l no wheezing, rhonchi or rales  Abd: soft, nontender MS: no deformity or atrophy Ext: he has trace edema to mid shin, chronic looking skin changes b/l LE  Skin: warm and dry, no rash Neuro:  No gross deficits appreciated Psych: euthymic mood, full affect  ICD site is stable, no tethering or discomfort   EKG:  Not done today  ICD interrogation done today and reviewed by myself:  AP/VS presenting batery and lead measurements are stable No arrhythmias No Afib since his DCCV   11/23/2019: TTE IMPRESSIONS  1.  Left ventricular ejection fraction, by estimation, is 30 to 35%. The  left ventricle has moderately decreased function. The left ventricle  demonstrates regional wall motion abnormalities (see scoring  diagram/findings for description). The left  ventricular internal cavity size was mildly dilated. Left ventricular  diastolic parameters are consistent with Grade II diastolic dysfunction  (pseudonormalization). Elevated left ventricular end-diastolic pressure.  2. Right ventricular systolic function is normal. The right ventricular  size is normal.  3. The mitral valve is normal in structure. Mild mitral valve  regurgitation. No evidence of mitral stenosis.  4.  The aortic valve is tricuspid. Aortic valve regurgitation is mild. No  aortic stenosis is present.  5. Aortic dilatation noted. There is mild dilatation of the ascending  aorta.  6. The inferior vena cava is normal in size with greater than 50%  respiratory variability, suggesting right atrial pressure of 3 mmHg.    12/20/2019: Stress myoview  The left ventricular ejection fraction is severely decreased (<30%).  Nuclear stress EF: 23%.  There was no ST segment deviation noted during stress.  There is a large defect of severe severity present in the basal anteroseptal, basal inferoseptal, mid anterior, mid anteroseptal, mid inferoseptal, apical anterior, apical septal and apex location. This consistent with prior infarct/scar. No ischemia.  Findings consistent with prior myocardial infarction.  This is a high risk study.   Recent Labs: 05/04/2019: TSH 2.870 08/07/2019: Hemoglobin 15.8; Platelets 264 11/01/2019: ALT 19; BUN 26; Creatinine, Ser 1.18; Potassium 4.9; Sodium 139  11/01/2019: Chol/HDL Ratio 2.8; Cholesterol, Total 115; HDL 41; LDL Chol Calc (NIH) 59; Triglycerides 74   CrCl cannot be calculated (Patient's most recent lab result is older than the maximum 21 days allowed.).   Wt Readings from Last 3  Encounters:  12/25/19 227 lb (103 kg)  12/20/19 222 lb (100.7 kg)  12/13/19 222 lb 8 oz (100.9 kg)     Other studies reviewed: Additional studies/records reviewed today include: summarized above  ASSESSMENT AND PLAN:  1. CRT-D     Intact function, 99.9% BV pacing  Earlier this month his LV threshold was 2.0V, today is 1.0.  I did not reduce his outputs from 3.0V given prior testing. He reports occassional diaphragmatic stim, this goes back years he states, and has discussed with Dr. Caryl Comes.  It is pretty positional particular, when seated and leaning to the right. It is reproduced today at [facility] on the LV lead, none at lower voltages. Given this has been previously discussed/addressed, no changes made, he report is not persistent or insenescent.    2.  ICM 3. Chronic CHF     His OptiVol remains well above threshold, though on it's way dow since Dr. Elmarie Shiley visit His weight is up, though no exam findings to suggest marked volume OL, and he denies any symptoms  He does not weight at home He does indulge in fast food, had pizza with friends yesterday  We discussed the importance of minimizing/low salt diet, and daily weights Discussed how and when to weigh and when to reach out for changes/rapid gains He had marked hypotension with his previous lasix use for a few days, discussed need for this periodically, he is quite worried about that Given is exam today is OK, he is not SOB and his OptiVol is trennding downwards, no changes today. I will ask he have an appt with L. Short in 2 weeks   4. CAD     No anginal symptoms     C/w Dr. Acie Fredrickson   5. Persistent Afib     CHA2DS2Vasc is 4, on Eliquis     Holding SR on amiodarone     Will update his labs today for both    Disposition: F/u with ICM, L. Short, RN, Dr. Acie Fredrickson as instructed by him.  Continue Q 3 mo remotes, and in-clinic 51mo, sooner if needed.  Current medicines are reviewed at length with the patient today.  The patient  did not have any concerns regarding medicines.  Venetia Night, PA-C 12/25/2019 10:22 AM     [facility] [address] [place] [address] [phone] (office)  [phone] (fax)

## 2019-12-25 ENCOUNTER — Other Ambulatory Visit: Payer: Self-pay

## 2019-12-25 ENCOUNTER — Ambulatory Visit: Payer: Medicare PPO | Admitting: Physician Assistant

## 2019-12-25 VITALS — BP 140/82 | HR 84 | Ht 71.0 in | Wt 227.0 lb

## 2019-12-25 DIAGNOSIS — Z95810 Presence of automatic (implantable) cardiac defibrillator: Secondary | ICD-10-CM

## 2019-12-25 DIAGNOSIS — I4819 Other persistent atrial fibrillation: Secondary | ICD-10-CM | POA: Diagnosis not present

## 2019-12-25 DIAGNOSIS — Z79899 Other long term (current) drug therapy: Secondary | ICD-10-CM

## 2019-12-25 DIAGNOSIS — I5022 Chronic systolic (congestive) heart failure: Secondary | ICD-10-CM

## 2019-12-25 DIAGNOSIS — I2510 Atherosclerotic heart disease of native coronary artery without angina pectoris: Secondary | ICD-10-CM | POA: Diagnosis not present

## 2019-12-25 DIAGNOSIS — I255 Ischemic cardiomyopathy: Secondary | ICD-10-CM

## 2019-12-25 LAB — CBC
Hematocrit: 42 % (ref 37.5–51.0)
Hemoglobin: 14.3 g/dL (ref 13.0–17.7)
MCH: 31.9 pg (ref 26.6–33.0)
MCHC: 34 g/dL (ref 31.5–35.7)
MCV: 94 fL (ref 79–97)
Platelets: 224 10*3/uL (ref 150–450)
RBC: 4.48 x10E6/uL (ref 4.14–5.80)
RDW: 12.5 % (ref 11.6–15.4)
WBC: 6.4 10*3/uL (ref 3.4–10.8)

## 2019-12-25 LAB — BASIC METABOLIC PANEL
BUN/Creatinine Ratio: 16 (ref 10–24)
BUN: 16 mg/dL (ref 8–27)
CO2: 22 mmol/L (ref 20–29)
Calcium: 9.3 mg/dL (ref 8.6–10.2)
Chloride: 104 mmol/L (ref 96–106)
Creatinine, Ser: 0.97 mg/dL (ref 0.76–1.27)
GFR calc Af Amer: 85 mL/min/{1.73_m2} (ref >59)
GFR calc non Af Amer: 73 mL/min/{1.73_m2} (ref >59)
Glucose: 104 mg/dL — ABNORMAL HIGH (ref 65–99)
Potassium: 4.5 mmol/L (ref 3.5–5.2)
Sodium: 143 mmol/L (ref 134–144)

## 2019-12-25 LAB — TSH: TSH: 5.55 u[IU]/mL — ABNORMAL HIGH (ref 0.450–4.500)

## 2019-12-25 NOTE — Patient Instructions (Addendum)
Medication Instructions:  Your physician recommends that you continue on your current medications as directed. Please refer to the Current Medication list given to you today.  *If you need a refill on your cardiac medications before your next appointment, please call your pharmacy*   Lab Work:   BMET CBC AND TSH TODAY   If you have labs (blood work) drawn today and your tests are completely normal, you will receive your results only by: [name] MyChart Message (if you have MyChart) OR . A paper copy in the mail If you have any lab test that is abnormal or we need to change your treatment, we will call you to review the results.   Testing/Procedures: NONE ORDERED  TODAY   Follow-Up: At [facility], you and your health needs are our priority.  As part of our continuing mission to provide you with exceptional heart care, we have created designated Provider Care Teams.  These Care Teams include your primary Cardiologist (physician) and Advanced Practice Providers (APPs -  Physician Assistants and Nurse Practitioners) who all work together to provide you with the care you need, when you need it.  We recommend signing up for the patient portal called "MyChart".  Sign up information is provided on this After Visit Summary.  MyChart is used to connect with patients for Virtual Visits (Telemedicine).  Patients are able to view lab/test results, encounter notes, upcoming appointments, etc.  Non-urgent messages can be sent to your provider as well.   To learn more about what you can do with MyChart, go to [email or web address].    Your next appointment:    IN 2 WEEKS WITH LAURIE SHORT IN DEVICE  FOR THE CARING HEART PROGRAM FOR HEART FAILURE   6 month(s)  The format for your next appointment:   In Person  Provider:   You may see Dr. Graciela Husbands or one of the following Advanced Practice Providers on your designated Care Team:    Gypsy Balsam, NP  Francis Dowse, [place]  Casimiro Needle "Mardelle Matte" [place],  [place]    Other Instructions  WOULD LIKE FOR YOU TO WEIGH TWICE  A DAY TO MONITOR WEIGHT  INCREASE IN WEIGHT GAIN OR NOT

## 2019-12-27 ENCOUNTER — Other Ambulatory Visit: Payer: Self-pay | Admitting: *Deleted

## 2019-12-27 DIAGNOSIS — Z79899 Other long term (current) drug therapy: Secondary | ICD-10-CM

## 2019-12-29 ENCOUNTER — Other Ambulatory Visit: Payer: Medicare PPO | Admitting: *Deleted

## 2019-12-29 ENCOUNTER — Other Ambulatory Visit: Payer: Self-pay

## 2019-12-29 ENCOUNTER — Telehealth: Payer: Self-pay | Admitting: Physician Assistant

## 2019-12-29 DIAGNOSIS — Z79899 Other long term (current) drug therapy: Secondary | ICD-10-CM

## 2019-12-29 LAB — HEPATIC FUNCTION PANEL
ALT: 14 IU/L (ref 0–44)
AST: 18 IU/L (ref 0–40)
Albumin: 3.8 g/dL (ref 3.7–4.7)
Alkaline Phosphatase: 76 IU/L (ref 39–117)
Bilirubin Total: 0.8 mg/dL (ref 0.0–1.2)
Bilirubin, Direct: 0.21 mg/dL (ref 0.00–0.40)
Total Protein: 6.3 g/dL (ref 6.0–8.5)

## 2019-12-29 LAB — T3, FREE: T3, Free: 3 pg/mL (ref 2.0–4.4)

## 2019-12-29 LAB — T4, FREE: Free T4: 1.26 ng/dL (ref 0.82–1.77)

## 2019-12-29 NOTE — Telephone Encounter (Signed)
The patient left some BP readings at home since his last visit 140's-150's/70's-80's  Weight 122, 122, > 121 > 121 today  He feels a little sluggish, not great energy, but otherwise feels OK, no CP, dizziness, no syncope.  Discussed historically his BP has gotten low making him feel weak and dizzy and required removal of some of his medicines. Given this, I would tend to allow his BP be a little higher. With improved LV lead function, hopefully he will start to feel a bit better and if his BP remains like it has this week, perhaps at his cardiology follow up visit they can resume low dose Entresto or ARB.  For now, no changes.  Encouraged to call if any other concerns, questions  Francis Dowse, PA-C

## 2020-01-03 ENCOUNTER — Telehealth: Payer: Self-pay | Admitting: *Deleted

## 2020-01-03 DIAGNOSIS — Z79899 Other long term (current) drug therapy: Secondary | ICD-10-CM

## 2020-01-03 NOTE — Telephone Encounter (Signed)
Follow up   Pt is returning call    

## 2020-01-03 NOTE — Telephone Encounter (Signed)
Lvm to contact office to talk about labs to be drawn to be scheduled

## 2020-01-08 ENCOUNTER — Ambulatory Visit (INDEPENDENT_AMBULATORY_CARE_PROVIDER_SITE_OTHER): Payer: Medicare PPO

## 2020-01-08 DIAGNOSIS — I5022 Chronic systolic (congestive) heart failure: Secondary | ICD-10-CM

## 2020-01-08 DIAGNOSIS — Z95810 Presence of automatic (implantable) cardiac defibrillator: Secondary | ICD-10-CM

## 2020-01-09 ENCOUNTER — Telehealth: Payer: Self-pay

## 2020-01-09 NOTE — Telephone Encounter (Signed)
Spoke with patient to remind of missed remote transmission 

## 2020-01-09 NOTE — Telephone Encounter (Signed)
Remote ICM transmission received.  Attempted call to patient regarding ICM remote transmission and left message to return call   

## 2020-01-09 NOTE — Progress Notes (Signed)
EPIC Encounter for ICM Monitoring  Patient Name: [name] is a 79 y.o. male Date: 01/09/2020 Primary Care Physican: Kaleen Mask, MD Primary Cardiologist: Nahser Electrophysiologist: Joycelyn Schmid Pacing: 99.9 %        Weight:  unknown                                                           Spoke with patient and he reports he is doing fine but feet are slightly swollen.   Optivol thoracic impedance suggesting possible fluid accumulation and trending slightly below baseline normal.  Threshold is improving and close to normal.  Prescribed: No diuretic.  Recommendations: No changes and encouraged to call if experiencing any fluid symptoms.  Follow-up plan: ICM clinic phone appointment on 02/06/2020 to recheck before office visit.   91 day device clinic remote transmission 03/12/2020.  Office appt with Tereso Newcomer, PA on 02/06/2020.    Copy of ICM check sent to Dr. Graciela Husbands and Dr Elease Hashimoto.   3 month ICM trend: 01/09/2020    1 Year ICM trend:       Karie Soda, RN 01/09/2020 3:31 PM

## 2020-02-06 ENCOUNTER — Telehealth: Payer: Self-pay

## 2020-02-06 ENCOUNTER — Other Ambulatory Visit: Payer: Medicare PPO | Admitting: *Deleted

## 2020-02-06 ENCOUNTER — Ambulatory Visit (INDEPENDENT_AMBULATORY_CARE_PROVIDER_SITE_OTHER): Payer: Medicare PPO

## 2020-02-06 ENCOUNTER — Ambulatory Visit: Payer: Medicare PPO | Admitting: Physician Assistant

## 2020-02-06 ENCOUNTER — Other Ambulatory Visit: Payer: Self-pay

## 2020-02-06 ENCOUNTER — Encounter: Payer: Self-pay | Admitting: Physician Assistant

## 2020-02-06 VITALS — BP 130/76 | HR 72 | Ht 71.0 in | Wt 222.0 lb

## 2020-02-06 DIAGNOSIS — Z95810 Presence of automatic (implantable) cardiac defibrillator: Secondary | ICD-10-CM

## 2020-02-06 DIAGNOSIS — I4819 Other persistent atrial fibrillation: Secondary | ICD-10-CM | POA: Diagnosis not present

## 2020-02-06 DIAGNOSIS — Z79899 Other long term (current) drug therapy: Secondary | ICD-10-CM

## 2020-02-06 DIAGNOSIS — I5022 Chronic systolic (congestive) heart failure: Secondary | ICD-10-CM

## 2020-02-06 DIAGNOSIS — I2510 Atherosclerotic heart disease of native coronary artery without angina pectoris: Secondary | ICD-10-CM

## 2020-02-06 LAB — TSH: TSH: 5.24 u[IU]/mL — ABNORMAL HIGH (ref 0.450–4.500)

## 2020-02-06 MED ORDER — POTASSIUM CHLORIDE ER 10 MEQ PO TBCR
10.0000 meq | EXTENDED_RELEASE_TABLET | ORAL | 3 refills | Status: DC
Start: 2020-02-07 — End: 2020-05-09

## 2020-02-06 MED ORDER — FUROSEMIDE 20 MG PO TABS: 20.0000 mg | ORAL_TABLET | ORAL | 3 refills | Status: DC

## 2020-02-06 NOTE — Telephone Encounter (Signed)
Received call back from daughter [name], [place] and explained reason for call.  She will have patient send a transmission for review.

## 2020-02-06 NOTE — Telephone Encounter (Signed)
Attempted call to patient and left message to return call.  °

## 2020-02-06 NOTE — Patient Instructions (Signed)
Medication Instructions:  Your physician has recommended you make the following change in your medication:  1. TAKE LASIX 20 MG ONLY ON MONDAYS, WEDNESDAYS, AND FRIDAYS   2. TAKE POTASSIUM 10 MEQ ONLY ON MONDAYS, WEDNESDAYS, AND FRIDAYS.  *If you need a refill on your cardiac medications before your next appointment, please call your pharmacy*   Lab Work: TO BE DONE IN 2 WEEKS: BMET  If you have labs (blood work) drawn today and your tests are completely normal, you will receive your results only by: [name] MyChart Message (if you have MyChart) OR . A paper copy in the mail If you have any lab test that is abnormal or we need to change your treatment, we will call you to review the results.   Testing/Procedures: NONE   Follow-Up: At [facility], you and your health needs are our priority.  As part of our continuing mission to provide you with exceptional heart care, we have created designated Provider Care Teams.  These Care Teams include your primary Cardiologist (physician) and Advanced Practice Providers (APPs -  Physician Assistants and Nurse Practitioners) who all work together to provide you with the care you need, when you need it.  We recommend signing up for the patient portal called "MyChart".  Sign up information is provided on this After Visit Summary.  MyChart is used to connect with patients for Virtual Visits (Telemedicine).  Patients are able to view lab/test results, encounter notes, upcoming appointments, etc.  Non-urgent messages can be sent to your provider as well.   To learn more about what you can do with MyChart, go to [email or web address].    Your next appointment:   3 month(s)  The format for your next appointment:   In Person  Provider:   You may see Kristeen Miss, MD or Tereso Newcomer, PA-C

## 2020-02-06 NOTE — Progress Notes (Signed)
Cardiology Office Note:    Date:  02/06/2020   ID:  [name], DOB [birth year], MRN [id number]  PCP:  Kaleen Mask, MD  Cardiologist:  Kristeen Miss, MD   Electrophysiologist:  None   Referring MD: Kaleen Mask, *   Chief Complaint:  Follow-up (CHF)    Patient Profile:    [name] is a 79 y.o. male with:   Coronary artery disease   S/p anterior STEMI in 2001 tx with PCI of LAD  Cath 2005: LAD stents patent  Myoview 12/2019: no ischemia   Systolic CHF  Ischemic CM  Chronic Warfarin Rx  >> Apixaban   S/p CRT-D  Persistent Atrial fibrillation   S/p DCCV 09/2018 - failed >> Amiodarone Rx  S/p DCCV 08/2019 >> Successful   Hx of hypotension   Hyperlipidemia   Gout  BPH  Prior CV studies: Myoview 12/20/2019 EF 23; anteroseptal, inferoseptal, anterior scar; no ischemia High risk  Echocardiogram 11/23/2019 EF 30-35, GRII DD, normal RV SF, mild MR, mild AI, mild dilation of ascending aorta (4 cm)  Cardiac catheterization 06/23/2004 LM normal LAD proximal stents patent; D1 80-90 (small, not amenable to PCI) LCx irregularities RCA normal EF 20  History of Present Illness:    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  He was last seen by Dr. Elease Hashimoto in April 2021.  His Optivol was consistent with volume overload.  He had had some chest discomfort as well.  His LV lead was nonfunctional.  In the office, his device was reprogrammed and his LV lead was again functional.  His blood pressures running low due to adjustments in his diuretic therapy.  His furosemide, potassium, Diovan were stopped.  Interrogation of his device through the [facility] clinic 12/19/2019 demonstrated normal thoracic impedance.  His device was interrogated again 01/08/2020 and demonstrated possible fluid accumulation.  The threshold was improving and close to normal.  No changes were made.  He returns for follow-up.  He is here alone today.  Since  last seen, he has been fairly stable.  He has not had significant shortness of breath with exertion.  He has not had chest pain.  He sometimes has to prop up to breathe better at night.  His lower extremity swelling is fairly stable.  He has not had syncope or ICD discharges.  Past Medical History:  Diagnosis Date  . Arthritis    knees, back   . BPH (benign prostatic hypertrophy)   . Chronic kidney disease    BPH  . Chronic systolic heart failure (HCC)    a. s/p MDT single chamber ICD 2004 as part of MASTER study b. upgrade to CRTD 2010; CRTD gen change 2016  . Coronary artery disease    a. s/p anterior MI and CYPHER stent to LAD 2005  . Dyslipidemia   . Gout   . Ischemic cardiomyopathy   . Paroxysmal atrial fibrillation (HCC)   . Ventricular tachycardia (HCC)     Current Medications: Current Meds  Medication Sig  . allopurinol (ZYLOPRIM) 100 MG tablet Take 100 mg by mouth daily.  [name] amiodarone (PACERONE) 200 MG tablet Take 1 tablet (200 mg total) by mouth daily.  . carvedilol (COREG) 3.125 MG tablet Take 1 tablet (3.125 mg total) by mouth 2 (two) times daily.  [name] ELIQUIS 5 MG TABS tablet TAKE 1 TABLET BY MOUTH  TWICE DAILY  . latanoprost (XALATAN) 0.005 % ophthalmic solution Place 1 drop into both  eyes at bedtime.   . naproxen sodium (ALEVE) 220 MG tablet Take 220 mg by mouth daily as needed (pain).  . nitroGLYCERIN (NITROSTAT) 0.4 MG SL tablet Place 1 tablet (0.4 mg total) under the tongue every 5 (five) minutes as needed for chest pain.  [name] Glycol-Propyl Glycol 0.4-0.3 % SOLN Place 1 drop into both eyes 2 (two) times daily.   . rosuvastatin (CRESTOR) 10 MG tablet Take 5 mg by mouth daily.  . TURMERIC PO Take 538 mg by mouth daily.      Allergies:   Atorvastatin   Social History   Tobacco Use  . Smoking status: Never Smoker  . Smokeless tobacco: Never Used  Substance Use Topics  . Alcohol use: No    Comment: occasional beer   . Drug use: No     Family Hx: The  patient's family history includes Heart disease in his brother, brother, sister, and sister.  ROS   EKGs/Labs/Other Test Reviewed:    EKG:  EKG is not ordered today.  The ekg ordered today demonstrates n/a  Recent Labs: 12/25/2019: BUN 16; Creatinine, Ser 0.97; Hemoglobin 14.3; Platelets 224; Potassium 4.5; Sodium 143 12/29/2019: ALT 14 02/06/2020: TSH 5.240   Recent Lipid Panel Lab Results  Component Value Date/Time   CHOL 115 11/01/2019 08:38 AM   TRIG 74 11/01/2019 08:38 AM   HDL 41 11/01/2019 08:38 AM   CHOLHDL 2.8 11/01/2019 08:38 AM   CHOLHDL 4.3 06/24/2016 12:01 PM   LDLCALC 59 11/01/2019 08:38 AM   LDLDIRECT 66.5 07/21/2011 09:26 AM    Physical Exam:    VS:  BP 130/76   Pulse 72   Ht 5\' 11"  (1.803 m)   Wt 222 lb (100.7 kg)   SpO2 96%   BMI 30.96 kg/m     Wt Readings from Last 3 Encounters:  02/06/20 222 lb (100.7 kg)  12/25/19 227 lb (103 kg)  12/20/19 222 lb (100.7 kg)     Constitutional:      Appearance: Healthy appearance. Not in distress.  Neck:     Thyroid: No thyromegaly.     Vascular: JVD normal.  Pulmonary:     Effort: Pulmonary effort is normal.     Breath sounds: No wheezing. No rales.  Cardiovascular:     Normal rate. Regular rhythm. Normal S1. Normal S2.     Murmurs: There is no murmur.  Edema:    Pretibial: bilateral 1+ edema of the pretibial area. Abdominal:     Palpations: Abdomen is soft. There is no hepatomegaly.  Skin:    General: Skin is warm and dry.  Neurological:     General: No focal deficit present.     Mental Status: Alert and oriented to person, place and time.     Cranial Nerves: Cranial nerves are intact.       ASSESSMENT & PLAN:    1. Chronic systolic CHF (congestive heart failure) (HCC) Ischemic cardiomyopathy.  EF 30-35 by echocardiogram March 2021.  NYHA II-IIb.  Several weeks ago, he was taken off of his ARB, furosemide and potassium due to low blood pressure.  Overall, his weights are stable.  His symptoms are  fairly stable.  He does have lower extremity swelling.  He has a device interrogation pending through the ICM clinic to recheck his OptiVol.  For now, I have recommended resuming furosemide at 20 mg every Monday, Wednesday, Friday along with potassium 10 mEq every Monday, Wednesday, Friday.  If his thoracic impedance is changed significantly  indicating fluid excess, we can certainly adjust his furosemide to daily dosing.  Hypotension has limited CHF therapy.  Continue current dose of carvedilol.  Consider resuming ARB with losartan 25 mg or lisinopril 2.5 mg at next visit.  2. Coronary artery disease involving native coronary artery of native heart without angina pectoris History of anterior MI in 2001 treated with PCI to the LAD.  Recent Myoview without ischemia.  He is not having anginal symptoms.  He is not on aspirin as he is on Apixaban.  Continue rosuvastatin.  3. Persistent atrial fibrillation (HCC) He appears to be maintaining normal sinus rhythm.  Continue amiodarone, Apixaban.  Recent ALT normal.  Recent TSH mildly elevated.  Repeat TSH was obtained earlier today and is currently pending.    Dispo:  Return in about 3 months (around 05/08/2020) for Routine Follow Up, w/ Dr. Elease Hashimoto, or Tereso Newcomer, PA-C, in person.   Medication Adjustments/Labs and Tests Ordered: Current medicines are reviewed at length with the patient today.  Concerns regarding medicines are outlined above.  Tests Ordered: Orders Placed This Encounter  Procedures  . Basic metabolic panel   Medication Changes: Meds ordered this encounter  Medications  . furosemide (LASIX) 20 MG tablet    Sig: Take 1 tablet (20 mg total) by mouth every Monday, Wednesday, and Friday.    Dispense:  90 tablet    Refill:  3  . potassium chloride (KLOR-CON) 10 MEQ tablet    Sig: Take 1 tablet (10 mEq total) by mouth every Monday, Wednesday, and Friday.    Dispense:  90 tablet    Refill:  3    Signed, Tereso Newcomer, PA-C  02/06/2020  5:35 PM    [facility] [address] [place], [place], [place]  [address] Phone: [phone]; Fax: [phone]

## 2020-02-07 ENCOUNTER — Other Ambulatory Visit: Payer: Self-pay | Admitting: *Deleted

## 2020-02-07 ENCOUNTER — Ambulatory Visit: Payer: Medicare PPO | Admitting: Physician Assistant

## 2020-02-07 DIAGNOSIS — Z79899 Other long term (current) drug therapy: Secondary | ICD-10-CM

## 2020-02-07 NOTE — Progress Notes (Signed)
EPIC Encounter for ICM Monitoring  Patient Name: [name] is a 79 y.o. male Date: 02/07/2020 Primary Care Physican: Kaleen Mask, MD Primary Cardiologist:Nahser Electrophysiologist:Klein Bi-V Pacing:99.9% 02/06/2020 Office Weight: 222 lbs   Transmission reviewed  Optivol thoracic impedancetrending along baseline normal   Prescribed:  Furosemide 20 mg take 1 tablet every Mon, Wed and Friday   Potassium 10 mEq take 1 tablet every Mon, Wed and Friday   Labs: 12/25/2019 Creatinine 0.97, BUN 16, Potassium 4.5, Sodium 143, GFR 73-85 11/01/2019 Creatinine 1.18, BUN 26, Potassium 4.9, Sodium 139, GFR 58-67  A complete set of results can be found in Results Review.  Recommendations:None  Follow-up plan: ICM clinic phone appointment on 03/13/2020. 91 day device clinic remote transmission 03/12/2020.   Copy of ICM check sent to Dr.Klein and Tereso Newcomer.   3 month ICM trend: 02/06/2020    1 Year ICM trend:       Karie Soda, RN 02/07/2020 8:37 AM

## 2020-02-07 NOTE — Progress Notes (Signed)
Thank you! Tereso Newcomer, PA-C    02/07/2020 4:36 PM

## 2020-02-08 ENCOUNTER — Telehealth: Payer: Self-pay

## 2020-02-08 NOTE — Telephone Encounter (Signed)
Patient called requesting follow up on recent manual transmission. Reviewed transmission from 02/07/20, no episodes noted. Optivol has actually improved and is WNL at this current time. Presenting rhythm AP/BIV Paced 60 bpm, Biv Paced 100%. Patient questioned changing medication doses, advised patient to speak to provider prior to changing any medications or doses. Verbalizes understanding. Advised patient to call DC if he has questions or concerns. Verbalizes understanding.

## 2020-02-08 NOTE — Telephone Encounter (Signed)
The pt was checking on his transmission. He asked how his fluid levels was. I let him speak with Leigh, rn.

## 2020-02-09 NOTE — Progress Notes (Signed)
Returned patient call as requested by voice mail message.  Reviewed transmission and patient reports he doing well.  Confirmed he is aware he has lab appointment on 6/15.  He asked if he should continue Furosemide and advised to continue.  Explained Tereso Newcomer will review lab results on 6/15 and cal if any medication adjustments are needed.  Advised will recheck fluid levels in July.

## 2020-02-20 ENCOUNTER — Other Ambulatory Visit: Payer: Medicare PPO

## 2020-02-20 ENCOUNTER — Other Ambulatory Visit: Payer: Self-pay

## 2020-02-20 DIAGNOSIS — I5022 Chronic systolic (congestive) heart failure: Secondary | ICD-10-CM

## 2020-02-20 DIAGNOSIS — I4819 Other persistent atrial fibrillation: Secondary | ICD-10-CM

## 2020-02-20 DIAGNOSIS — I2510 Atherosclerotic heart disease of native coronary artery without angina pectoris: Secondary | ICD-10-CM

## 2020-02-20 LAB — BASIC METABOLIC PANEL
BUN/Creatinine Ratio: 14 (ref 10–24)
BUN: 18 mg/dL (ref 8–27)
CO2: 23 mmol/L (ref 20–29)
Calcium: 8.8 mg/dL (ref 8.6–10.2)
Chloride: 99 mmol/L (ref 96–106)
Creatinine, Ser: 1.27 mg/dL (ref 0.76–1.27)
GFR calc Af Amer: 61 mL/min/{1.73_m2} (ref >59)
GFR calc non Af Amer: 53 mL/min/{1.73_m2} — ABNORMAL LOW (ref >59)
Glucose: 149 mg/dL — ABNORMAL HIGH (ref 65–99)
Potassium: 4.5 mmol/L (ref 3.5–5.2)
Sodium: 137 mmol/L (ref 134–144)

## 2020-02-26 ENCOUNTER — Encounter: Payer: Medicare PPO | Attending: Physician Assistant | Admitting: Physician Assistant

## 2020-02-26 ENCOUNTER — Other Ambulatory Visit
Admission: RE | Admit: 2020-02-26 | Discharge: 2020-02-26 | Disposition: A | Discharge status: Home / Self Care | Payer: Medicare PPO | Source: Ambulatory Visit | Attending: Physician Assistant | Admitting: Physician Assistant

## 2020-02-26 ENCOUNTER — Other Ambulatory Visit: Payer: Self-pay

## 2020-02-26 DIAGNOSIS — I252 Old myocardial infarction: Secondary | ICD-10-CM | POA: Diagnosis not present

## 2020-02-26 DIAGNOSIS — L98499 Non-pressure chronic ulcer of skin of other sites with unspecified severity: Secondary | ICD-10-CM | POA: Diagnosis not present

## 2020-02-26 DIAGNOSIS — Z950 Presence of cardiac pacemaker: Secondary | ICD-10-CM | POA: Diagnosis not present

## 2020-02-26 DIAGNOSIS — Z8249 Family history of ischemic heart disease and other diseases of the circulatory system: Secondary | ICD-10-CM | POA: Insufficient documentation

## 2020-02-26 DIAGNOSIS — L03116 Cellulitis of left lower limb: Secondary | ICD-10-CM | POA: Diagnosis present

## 2020-02-26 DIAGNOSIS — X58XXXA Exposure to other specified factors, initial encounter: Secondary | ICD-10-CM | POA: Diagnosis not present

## 2020-02-26 DIAGNOSIS — I779 Disorder of arteries and arterioles, unspecified: Secondary | ICD-10-CM | POA: Insufficient documentation

## 2020-02-26 DIAGNOSIS — Z881 Allergy status to other antibiotic agents status: Secondary | ICD-10-CM | POA: Diagnosis not present

## 2020-02-26 DIAGNOSIS — I10 Essential (primary) hypertension: Secondary | ICD-10-CM | POA: Insufficient documentation

## 2020-02-26 DIAGNOSIS — S81802A Unspecified open wound, left lower leg, initial encounter: Secondary | ICD-10-CM | POA: Insufficient documentation

## 2020-02-26 DIAGNOSIS — S81801A Unspecified open wound, right lower leg, initial encounter: Secondary | ICD-10-CM | POA: Insufficient documentation

## 2020-02-26 DIAGNOSIS — I2510 Atherosclerotic heart disease of native coronary artery without angina pectoris: Secondary | ICD-10-CM | POA: Diagnosis not present

## 2020-02-26 DIAGNOSIS — H409 Unspecified glaucoma: Secondary | ICD-10-CM | POA: Diagnosis not present

## 2020-02-26 DIAGNOSIS — M1990 Unspecified osteoarthritis, unspecified site: Secondary | ICD-10-CM | POA: Insufficient documentation

## 2020-02-26 DIAGNOSIS — M109 Gout, unspecified: Secondary | ICD-10-CM | POA: Insufficient documentation

## 2020-02-26 DIAGNOSIS — Z7901 Long term (current) use of anticoagulants: Secondary | ICD-10-CM | POA: Diagnosis not present

## 2020-02-26 NOTE — Progress Notes (Signed)
TAIM, WURM ([id number]) Visit Report for 02/26/2020 Abuse/Suicide Risk Screen Details Patient Name: [name], [name] Date of Service: 02/26/2020 9:15 AM Medical Record Number: [id number] Patient Account Number: [id number] Date of Birth/Sex: 1941/02/25 (79 y.o. M) Treating RN: Montey Hora Primary Care Meriem Lemieux: Claris Gower Other Clinician: Referring Abi Shoults: Referral, Self Treating Estrellita Lasky/Extender: STONE III, HOYT Weeks in Treatment: 0 Abuse/Suicide Risk Screen Items Answer ABUSE RISK SCREEN: Has anyone close to you tried to hurt or harm you recentlyo No Do you feel uncomfortable with anyone in your familyo No Has anyone forced you do things that you didnot want to doo No Electronic Signature(s) Signed: 02/26/2020 10:16:22 AM By: Montey Hora Entered By: Montey Hora on 02/26/2020 10:16:21 Dresden, Janine Ores ([id number]) -------------------------------------------------------------------------------- Activities of Daily Living Details Patient Name: Hazzard, [name] Date of Service: 02/26/2020 9:15 AM Medical Record Number: [id number] Patient Account Number: [id number] Date of Birth/Sex: October 27, 1940 (79 y.o. M) Treating RN: Montey Hora Primary Care Necia Kamm: Claris Gower Other Clinician: Referring Michial Disney: Referral, Self Treating Seyed Heffley/Extender: Melburn Hake, HOYT Weeks in Treatment: 0 Activities of Daily Living Items Answer Activities of Daily Living (Please select one for each item) Drive Automobile Completely Able Take Medications Completely Able Use Telephone Completely Able Care for Appearance Completely Able Use Toilet Completely Able Bath / Shower Completely Able Dress Self Completely Able Feed Self Completely Able Walk Completely Able Get In / Out Bed Completely Able Housework Completely Able Prepare Meals Completely [place] for Self Completely Able Electronic Signature(s) Signed: 02/26/2020 10:16:44 AM By: Montey Hora Entered By: Montey Hora on 02/26/2020 10:16:43 Conaway, Janine Ores ([id number]) -------------------------------------------------------------------------------- Education Screening Details Patient Name: Vanbergen, [name] Date of Service: 02/26/2020 9:15 AM Medical Record Number: [id number] Patient Account Number: [id number] Date of Birth/Sex: 10-11-1940 (79 y.o. M) Treating RN: Montey Hora Primary Care Avishai Reihl: Claris Gower Other Clinician: Referring Tasean Mancha: Referral, Self Treating Davionna Blacksher/Extender: Melburn Hake, HOYT Weeks in Treatment: 0 Primary Learner Assessed: Patient Learning Preferences/Education Level/Primary Language Learning Preference: Explanation, Demonstration Highest Education Level: College or Above Preferred Language: English Cognitive Barrier Language Barrier: No Translator Needed: No Memory Deficit: No Emotional Barrier: No Cultural/Religious Beliefs Affecting Medical Care: No Physical Barrier Impaired Vision: No Impaired Hearing: No Decreased Hand dexterity: No Knowledge/Comprehension Knowledge Level: Medium Comprehension Level: Medium Ability to understand written instructions: Medium Ability to understand verbal instructions: Medium Motivation Anxiety Level: Calm Cooperation: Cooperative Education Importance: Acknowledges Need Interest in Health Problems: Asks Questions Perception: Coherent Willingness to Engage in Self-Management Medium Activities: Readiness to Engage in Self-Management Medium Activities: Electronic Signature(s) Signed: 02/26/2020 10:17:16 AM By: Montey Hora Entered By: Montey Hora on 02/26/2020 10:17:15 Drouillard, Janine Ores ([id number]) -------------------------------------------------------------------------------- Fall Risk Assessment Details Patient Name: Lefevers, [name] Date of Service: 02/26/2020 9:15 AM Medical Record Number: [id number] Patient Account Number: [id number] Date of Birth/Sex: [birth year] (78 y.o.  M) Treating RN: Montey Hora Primary Care Haasini Patnaude: Claris Gower Other Clinician: Referring Anselmo Reihl: Referral, Self Treating Cloud Graham/Extender: Melburn Hake, HOYT Weeks in Treatment: 0 Fall Risk Assessment Items Have you had 2 or more falls in the last 12 monthso 0 Yes Have you had any fall that resulted in injury in the last 12 monthso 0 Yes FALLS RISK SCREEN History of falling - immediate or within 3 months 25 Yes Secondary diagnosis (Do you have 2 or more medical diagnoseso) 0 No Ambulatory aid None/bed rest/wheelchair/nurse 0 Yes Crutches/cane/walker 0 No Furniture 0 No Intravenous therapy Access/Saline/Heparin Lock 0 No Gait/Transferring Normal/ bed rest/ wheelchair 0 No Weak (short  steps with or without shuffle, stooped but able to lift head while walking, may 10 Yes seek support from furniture) Impaired (short steps with shuffle, may have difficulty arising from chair, head down, impaired 0 No balance) Mental Status Oriented to own ability 0 Yes Electronic Signature(s) Signed: 02/26/2020 10:17:31 AM By: Curtis Sites Entered By: Curtis Sites on 02/26/2020 10:17:31 Ossa, [name] ([id number]) -------------------------------------------------------------------------------- Foot Assessment Details Patient Name: Liuzzi, [name] Date of Service: 02/26/2020 9:15 AM Medical Record Number: [id number] Patient Account Number: [id number] Date of Birth/Sex: 10-08-1940 (79 y.o. M) Treating RN: Curtis Sites Primary Care Sybrina Laning: Windle Guard Other Clinician: Referring Daysia Vandenboom: Referral, Self Treating Chester Romero/Extender: Linwood Dibbles, HOYT Weeks in Treatment: 0 Foot Assessment Items Site Locations + = Sensation present, - = Sensation absent, C = Callus, U = Ulcer R = Redness, W = Warmth, M = Maceration, PU = Pre-ulcerative lesion F = Fissure, S = Swelling, D = Dryness Assessment Right: Left: Other Deformity: No No Prior Foot Ulcer: No No Prior Amputation: No  No Charcot Joint: No No Ambulatory Status: Ambulatory Without Help Gait: Unsteady Electronic Signature(s) Signed: 02/26/2020 10:17:56 AM By: Curtis Sites Entered By: Curtis Sites on 02/26/2020 10:17:55 Doyel, [name] ([id number]) -------------------------------------------------------------------------------- Nutrition Risk Screening Details Patient Name: Sturges, Lam L. Date of Service: 02/26/2020 9:15 AM Medical Record Number: [id number] Patient Account Number: [id number] Date of Birth/Sex: 12/21/1940 (79 y.o. M) Treating RN: Curtis Sites Primary Care Nickey Canedo: Windle Guard Other Clinician: Referring Sudais Banghart: Referral, Self Treating Jniyah Dantuono/Extender: STONE III, HOYT Weeks in Treatment: 0 Height (in): Weight (lbs): Body Mass Index (BMI): Nutrition Risk Screening Items Score Screening NUTRITION RISK SCREEN: I have an illness or condition that made me change the kind and/or amount of food I eat 0 No I eat fewer than two meals per day 0 No I eat few fruits and vegetables, or milk products 0 No I have three or more drinks of beer, liquor or wine almost every day 0 No I have tooth or mouth problems that make it hard for me to eat 0 No I don't always have enough money to buy the food I need 0 No I eat alone most of the time 0 No I take three or more different prescribed or over-the-counter drugs a day 1 Yes Without wanting to, I have lost or gained 10 pounds in the last six months 0 No I am not always physically able to shop, cook and/or feed myself 0 No Nutrition Protocols Good Risk Protocol 0 No interventions needed Moderate Risk Protocol High Risk Proctocol Risk Level: Good Risk Score: 1 Electronic Signature(s) Signed: 02/26/2020 10:17:39 AM By: Curtis Sites Entered By: Curtis Sites on 02/26/2020 10:17:38

## 2020-02-27 NOTE — Progress Notes (Addendum)
CLEMENTS, TORO ([id number]) Visit Report for 02/26/2020 Chief Complaint Document Details Patient Name: Coin, MACARIUS L. Date of Service: 02/26/2020 9:15 AM Medical Record Number: [id number] Patient Account Number: [id number] Date of Birth/Sex: October 17, 1940 (79 y.o. M) Treating RN: Huel Coventry Primary Care Provider: Windle Guard Other Clinician: Referring Provider: Referral, Self Treating Provider/Extender: Linwood Dibbles, Tyffani Foglesong Weeks in Treatment: 0 Information Obtained from: Patient Chief Complaint Right arm and left leg ulcer Electronic Signature(s) Signed: 02/26/2020 4:16:09 PM By: Lenda Kelp PA-C Entered By: Lenda Kelp on 02/26/2020 10:02:24 Eckhardt, [name] ([id number]) -------------------------------------------------------------------------------- Debridement Details Patient Name: [name] Date of Service: 02/26/2020 9:15 AM Medical Record Number: [id number] Patient Account Number: [id number] Date of Birth/Sex: 1941/08/05 (79 y.o. M) Treating RN: Huel Coventry Primary Care Provider: Windle Guard Other Clinician: Referring Provider: Referral, Self Treating Provider/Extender: Linwood Dibbles, Latashia Koch Weeks in Treatment: 0 Debridement Performed for Wound #3 Left,Anterior Lower Leg Assessment: Performed By: Physician STONE III, Chandrea Zellman E., PA-C Debridement Type: Chemical/Enzymatic/Mechanical Agent Used: Gauze and saline Level of Consciousness (Pre- Awake and Alert procedure): Pre-procedure Verification/Time Out Yes - 10:15 Taken: Instrument: Other : gauze and saline Bleeding: Minimum Hemostasis Achieved: Pressure Response to Treatment: Procedure was tolerated well Level of Consciousness (Post- Awake and Alert procedure): Post Debridement Measurements of Total Wound Length: (cm) 4 Width: (cm) 2 Depth: (cm) 0.1 Volume: (cm) 0.628 Character of Wound/Ulcer Post Debridement: Stable Post Procedure Diagnosis Same as Pre-procedure Electronic Signature(s) Signed: 02/26/2020  4:16:09 PM By: Lenda Kelp PA-C Signed: 02/26/2020 4:54:26 PM By: Elliot Gurney, BSN, RN, CWS, Kim RN, BSN Entered By: Elliot Gurney, BSN, RN, CWS, Kim on 02/26/2020 10:21:09 Heimann, [name] ([id number]) -------------------------------------------------------------------------------- Debridement Details Patient Name: Labus, Kwesi L. Date of Service: 02/26/2020 9:15 AM Medical Record Number: [id number] Patient Account Number: [id number] Date of Birth/Sex: 08-28-1941 (79 y.o. M) Treating RN: Huel Coventry Primary Care Provider: Windle Guard Other Clinician: Referring Provider: Referral, Self Treating Provider/Extender: Linwood Dibbles, Christel Bai Weeks in Treatment: 0 Debridement Performed for Wound #4 Left,Distal,Anterior Lower Leg Assessment: Performed By: Physician STONE III, Keona Sheffler E., PA-C Debridement Type: Chemical/Enzymatic/Mechanical Agent Used: Gauze and saline Level of Consciousness (Pre- Awake and Alert procedure): Pre-procedure Verification/Time Out Yes - 10:15 Taken: Instrument: Other : gauze and saline Bleeding: Minimum Hemostasis Achieved: Pressure Response to Treatment: Procedure was tolerated well Level of Consciousness (Post- Awake and Alert procedure): Post Debridement Measurements of Total Wound Length: (cm) 0.8 Width: (cm) 1.1 Depth: (cm) 0.1 Volume: (cm) 0.069 Character of Wound/Ulcer Post Debridement: Stable Post Procedure Diagnosis Same as Pre-procedure Electronic Signature(s) Signed: 02/26/2020 4:16:09 PM By: Lenda Kelp PA-C Signed: 02/26/2020 4:54:26 PM By: Elliot Gurney, BSN, RN, CWS, Kim RN, BSN Entered By: Elliot Gurney, BSN, RN, CWS, Kim on 02/26/2020 10:21:33 Haye, [name] ([id number]) -------------------------------------------------------------------------------- HPI Details Patient Name: Freitas, Landen L. Date of Service: 02/26/2020 9:15 AM Medical Record Number: [id number] Patient Account Number: [id number] Date of Birth/Sex: 07/13/1941 (79 y.o. M) Treating RN: Huel Coventry Primary Care Provider: Windle Guard Other Clinician: Referring Provider: Referral, Self Treating Provider/Extender: Linwood Dibbles, Amery Vandenbos Weeks in Treatment: 0 History of Present Illness HPI Description: 03/18/17 on evaluation today patient presents for initial visit concerning an ulcer he has on the left lateral lower extremity which has been present since mid February 2018. Unfortunately due to other family circumstances evaluation and treatment of this wound has been put on hold until this point when they could finally get him into be seen. He does have high blood pressure but has no history of diabetes though  he does tell me that his cardiologist had been monitoring him for hyperglycemia and he is "borderline". He does have some discomfort in regard to this wound but fortunately this is not severe and typically with cleansing. His main concern is why this wound is not healing as in the past he has never had any difficulty with healing. The initial injury was during the period of time where he was helping a friend with some work and scrape the leg on a brick during that process. It has just never healed since that time. Patient is on chronic anticoagulant therapy and has not had any cardiovascular procedures such as arterial or venous imaging. 03/25/2017 - I'm seeing the patient for the first time today and notes that he has had a injury to the left lower extremity since mid February and from what I understand a hematoma opened out into a lacerated wound and this has been slow to heal. He has had no history of previous arterial or venous problems and has had some cardiac history with stents placed. He is not a diabetic and not a smoker. During his previous visit there has been a arterial and venous duplex study ordered and this is pending 04/01/2017 -- arterial and venous duplex studies still pending 04/08/2017 -- lower extremity venous duplex reflux evaluation done on 04/02/2017 showed no  evidence of venous incompetence in both left and right great saphenous veins. lower extremity arterial duplex evaluation done on 04/06/2017 -- no evidence of hemodynamically significant arterial occlusive disease bilaterally. the right ABI was 1.19 with a toe pressure of 0.83 and the left ABI was 1.25 with a toe pressures of 0.78 and triphasic flow. these were all normal. 04/15/17 on evaluation today patient appears to be doing fairly well in regard to his right lower extremity wound. He has been tolerating the dressing without complication. His biggest thing is that he states he was "hoping that today would be the last visit and he will not have to come back". Nonetheless he still has an open wound noted and I explained to him that we do want to see him back to obviously work toward getting this to close appropriately. Fortunately we did have the results as noted above of his arterial duplex study and venous studies which appear to be doing very well. Overall I do believe he is progressing nicely. No fevers, chills, nausea, or vomiting noted at this time. 04/22/17 on evaluation today patient's left lower from the wound appears to be doing about the same as last week's evaluation. He also has a superficial skin tear in the lateral periwound that I believe is due to the current dressing that is the [facility] Dressing actually sticking to his skin. I think this could be potentially damaging the wound bed as well. With that being said he is not having any significant pain. No fevers, chills, nausea, or vomiting noted at this time. Readmission: 02/26/2020 patient presents today for reevaluation here clinically has not been seen since August 2018. With that being said he tells me that about a week ago he did sustain an injury to his leg during a fall. Fortunately there is no signs of active infection at this time. No fevers, chills, nausea, vomiting, or diarrhea. He tells me that he has "not been  putting antibiotic ointment on it because that will not help it heal". Mainly they have just been wrapping this with nothing on it and cleaning with saline in between. Fortunately there is no signs  of obvious infection although I am concerned about that possibility with the drainage she is having I think it may be more due to swelling than anything. He does have a wound on the upper arm as well on the right but this appears to be almost completely healed I am not really concerned about this I think just a protective bandage is all we really need here. 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 Electronic Signature(s) Signed: 02/26/2020 4:09:50 PM By: Worthy Keeler PA-C Entered By: Worthy Keeler on 02/26/2020 16:09:50 Lanigan, Janine Ores ([id number]) -------------------------------------------------------------------------------- Physical Exam Details Patient Name: Marcel, Jermall L. Date of Service: 02/26/2020 9:15 AM Medical Record Number: [id number] Patient Account Number: [id number] Date of Birth/Sex: 1940-11-24 (79 y.o. M) Treating RN: Cornell Barman Primary Care Provider: Claris Gower Other Clinician: Referring Provider: Referral, Self Treating Provider/Extender: STONE III, Rodnesha Elie Weeks in Treatment: 0 Constitutional sitting or standing blood pressure is within target range for patient.. pulse regular and within target range for patient.[name] respirations regular, non- labored and within target range for patient.[name] temperature within target range for patient.. Well-nourished and well-hydrated in no acute distress. Eyes conjunctiva clear no eyelid edema noted. pupils equal round and reactive to light and accommodation. Ears, Nose, Mouth, and Throat no gross abnormality of ear auricles or external auditory canals. normal hearing noted during conversation. mucus membranes  moist. Respiratory normal breathing without difficulty. Cardiovascular 2+ dorsalis pedis/posterior tibialis pulses. 1+ pitting edema of the bilateral lower extremities. Musculoskeletal normal gait and posture. no significant deformity or arthritic changes, no loss or range of motion, no clubbing. Psychiatric this patient is able to make decisions and demonstrates good insight into disease process. Alert and Oriented x 3. pleasant and cooperative. Notes Upon inspection patient did have significant drainage from the largest of the wounds on his lower extremity I did actually did not need to perform sharp debridement today which was great news although I think that this may just be more due to the swelling I am going to culture the drainage to ensure that there is nothing infection wise that could be occurring here as well. He does have some mild pitting edema though his pulses seem to be okay at this point. No ABI was performed simply due to the fact that I think it would be too painful for him. Electronic Signature(s) Signed: 02/26/2020 4:10:32 PM By: Worthy Keeler PA-C Entered By: Worthy Keeler on 02/26/2020 16:10:32 Narducci, Janine Ores ([id number]) -------------------------------------------------------------------------------- Physician Orders Details Patient Name: Elvina Sidle L. Date of Service: 02/26/2020 9:15 AM Medical Record Number: [id number] Patient Account Number: [id number] Date of Birth/Sex: [birth year] (79 y.o. M) Treating RN: Cornell Barman Primary Care Provider: Claris Gower Other Clinician: Referring Provider: Referral, Self Treating Provider/Extender: Melburn Hake, Holmes Hays Weeks in Treatment: 0 Verbal / Phone Orders: No Diagnosis Coding ICD-10 Coding Code Description S41.101A Unspecified open wound of right upper arm, initial encounter S81.802A Unspecified open wound, left lower leg, initial encounter I10 Essential (primary) hypertension Z79.01 Long term (current) use of  anticoagulants Z95.0 Presence of cardiac pacemaker I25.10 Atherosclerotic heart disease of native coronary artery without angina pectoris Wound Cleansing Wound #3 Left,Anterior Lower Leg o Cleanse wound with mild soap and water Wound #4 Left,Distal,Anterior Lower Leg o Cleanse wound with mild soap and water Anesthetic (add to Medication List) Wound #3 Left,Anterior Lower Leg o Topical Lidocaine 4% cream applied to wound bed  prior to debridement (In Clinic Only). Wound #4 Left,Distal,Anterior Lower Leg o Topical Lidocaine 4% cream applied to wound bed prior to debridement (In Clinic Only). Primary Wound Dressing Wound #3 Left,Anterior Lower Leg o Silver Alginate Wound #4 Left,Distal,Anterior Lower Leg o Silver Alginate Secondary Dressing Wound #3 Left,Anterior Lower Leg o Conform/Kerlix Wound #4 Left,Distal,Anterior Lower Leg o Conform/Kerlix Dressing Change Frequency Wound #3 Left,Anterior Lower Leg o Change Dressing Monday, Wednesday, Friday Wound #4 Left,Distal,Anterior Lower Leg o Change Dressing Monday, Wednesday, Friday Follow-up Appointments Wound #3 Left,Anterior Lower Leg o Return Appointment in 1 week. Wound #4 Left,Distal,Anterior Lower Leg o Return Appointment in 1 week. Monico BlitzHICE, Mehar L. ([id number]) Edema Control Wound #3 Left,Anterior Lower Leg o Other: - Tubi-grip F Wound #4 Left,Distal,Anterior Lower Leg o Other: - Tubi-grip F Additional Orders / Instructions Wound #3 Left,Anterior Lower Leg o Activity as tolerated Wound #4 Left,Distal,Anterior Lower Leg o Activity as tolerated Laboratory o Bacteria identified in Wound by Culture (MICRO) - Left lower leg oooo LOINC Code: 6462-6 oooo Convenience Name: Wound culture routine Patient Medications Allergies: No Known Drug Allergies Notifications Medication Indication Start End Cipro 03/01/2020 DOSE 1 - oral 500 mg tablet - 1 tablet oral taken 2 times per day for 10  days Electronic Signature(s) Signed: 03/01/2020 9:08:15 AM By: Lenda KelpStone III, Shakaria Raphael PA-C Previous Signature: 02/26/2020 4:16:09 PM Version By: Lenda KelpStone III, Wendal Wilkie PA-C Previous Signature: 02/26/2020 4:54:26 PM Version By: Elliot GurneyWoody, BSN, RN, CWS, Kim RN, BSN Entered By: Lenda KelpStone III, Kyan Giannone on 03/01/2020 09:08:14 Giza, Ferdinand LangoICHARD L. ([id number]) -------------------------------------------------------------------------------- Problem List Details Patient Name: Mcshan, Rumi L. Date of Service: 02/26/2020 9:15 AM Medical Record Number: [id number] Patient Account Number: [id number] Date of Birth/Sex: 10-01-1940 (79 y.o. M) Treating RN: Huel CoventryWoody, Kim Primary Care Provider: Windle GuardELKINS, WILSON Other Clinician: Referring Provider: Referral, Self Treating Provider/Extender: Linwood DibblesSTONE III, Anatasia Tino Weeks in Treatment: 0 Active Problems ICD-10 Encounter Code Description Active Date MDM Diagnosis S41.101A Unspecified open wound of right upper arm, initial encounter 02/26/2020 No Yes S81.802A Unspecified open wound, left lower leg, initial encounter 02/26/2020 No Yes I10 Essential (primary) hypertension 02/26/2020 No Yes Z79.01 Long term (current) use of anticoagulants 02/26/2020 No Yes Z95.0 Presence of cardiac pacemaker 02/26/2020 No Yes I25.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 ([id number]) -------------------------------------------------------------------------------- Progress Note Details Patient Name: Whisonant, Alper L. Date of Service: 02/26/2020 9:15 AM Medical Record Number: [id number] Patient Account Number: [id number] Date of Birth/Sex: 10-01-1940 (79 y.o. M) Treating RN: Huel CoventryWoody, Kim Primary Care Provider: Windle GuardELKINS, WILSON Other Clinician: Referring Provider: Referral, Self Treating  Provider/Extender: Linwood DibblesSTONE III, Hortense Cantrall Weeks in Treatment: 0 Subjective Chief Complaint Information obtained from Patient Right arm and left leg ulcer History of Present Illness (HPI) 03/18/17 on evaluation today patient presents for initial visit concerning an ulcer he has on the left lateral lower extremity which has been present since mid February 2018. Unfortunately due to other family circumstances evaluation and treatment of this wound has been put on hold until this point when they could finally get him into be seen. He does have high blood pressure but has no history of diabetes though he does tell me that his cardiologist had been monitoring him for hyperglycemia and he is "borderline". He does have some discomfort in regard to this wound but fortunately this is not severe and typically with cleansing. His main  concern is why this wound is not healing as in the past he has never had any difficulty with healing. The initial injury was during the period of time where he was helping a friend with some work and scrape the leg on a brick during that process. It has just never healed since that time. Patient is on chronic anticoagulant therapy and has not had any cardiovascular procedures such as arterial or venous imaging. 03/25/2017 - I'm seeing the patient for the first time today and notes that he has had a injury to the left lower extremity since mid February and from what I understand a hematoma opened out into a lacerated wound and this has been slow to heal. He has had no history of previous arterial or venous problems and has had some cardiac history with stents placed. He is not a diabetic and not a smoker. During his previous visit there has been a arterial and venous duplex study ordered and this is pending 04/01/2017 -- arterial and venous duplex studies still pending 04/08/2017 -- lower extremity venous duplex reflux evaluation done on 04/02/2017 showed no evidence of venous  incompetence in both left and right great saphenous veins. lower extremity arterial duplex evaluation done on 04/06/2017 -- no evidence of hemodynamically significant arterial occlusive disease bilaterally. the right ABI was 1.19 with a toe pressure of 0.83 and the left ABI was 1.25 with a toe pressures of 0.78 and triphasic flow. these were all normal. 04/15/17 on evaluation today patient appears to be doing fairly well in regard to his right lower extremity wound. He has been tolerating the dressing without complication. His biggest thing is that he states he was "hoping that today would be the last visit and he will not have to come back". Nonetheless he still has an open wound noted and I explained to him that we do want to see him back to obviously work toward getting this to close appropriately. Fortunately we did have the results as noted above of his arterial duplex study and venous studies which appear to be doing very well. Overall I do believe he is progressing nicely. No fevers, chills, nausea, or vomiting noted at this time. 04/22/17 on evaluation today patient's left lower from the wound appears to be doing about the same as last week's evaluation. He also has a superficial skin tear in the lateral periwound that I believe is due to the current dressing that is the [facility] Dressing actually sticking to his skin. I think this could be potentially damaging the wound bed as well. With that being said he is not having any significant pain. No fevers, chills, nausea, or vomiting noted at this time. Readmission: 02/26/2020 patient presents today for reevaluation here clinically has not been seen since August 2018. With that being said he tells me that about a week ago he did sustain an injury to his leg during a fall. Fortunately there is no signs of active infection at this time. No fevers, chills, nausea, vomiting, or diarrhea. He tells me that he has "not been putting antibiotic  ointment on it because that will not help it heal". Mainly they have just been wrapping this with nothing on it and cleaning with saline in between. Fortunately there is no signs of obvious infection although I am concerned about that possibility with the drainage she is having I think it may be more due to swelling than anything. He does have a wound on the upper arm as well on  the right but this appears to be almost completely healed I am not really concerned about this I think just a protective bandage is all we really need here. 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 Patient History Information obtained from Patient. Allergies No Known Drug Allergies Family History Heart Disease - Father,Siblings, Hypertension - Siblings, No family history of Cancer, Diabetes, Hereditary Spherocytosis, Kidney Disease, Lung Disease, Seizures, Stroke, Thyroid Problems, Tuberculosis. Social History Never smoker, Marital Status - Widowed, Alcohol Use - Never, Drug Use - No History, Caffeine Use - Rarely. Medical History Eyes Patient has history of Cataracts - bilateral removal, Glaucoma Denies history of Optic Neuritis Troeger, Raymont L. ([id number]) Ear/Nose/Mouth/Throat Denies history of Chronic sinus problems/congestion, Middle ear problems Hematologic/Lymphatic Denies history of Anemia, Hemophilia, Human Immunodeficiency Virus, Lymphedema, Sickle Cell Disease Respiratory Denies history of Aspiration, Asthma, Chronic Obstructive Pulmonary Disease (COPD), Pneumothorax, Sleep Apnea, Tuberculosis Cardiovascular Patient has history of Arrhythmia, Coronary Artery Disease, Hypertension, Myocardial Infarction - 2000, Peripheral Arterial Disease, Peripheral Venous Disease Gastrointestinal Denies history of Cirrhosis , Colitis, Crohn s, Hepatitis A, Hepatitis B, Hepatitis  C Endocrine Denies history of Type I Diabetes, Type II Diabetes Genitourinary Denies history of End Stage Renal Disease Immunological Denies history of Lupus Erythematosus, Raynaud s, Scleroderma Integumentary (Skin) Denies history of History of Burn, History of pressure wounds Musculoskeletal Patient has history of Gout, Osteoarthritis Denies history of Rheumatoid Arthritis, Osteomyelitis Neurologic Denies history of Dementia, Neuropathy, Quadriplegia, Paraplegia, Seizure Disorder Oncologic Denies history of Received Chemotherapy, Received Radiation Psychiatric Denies history of Anorexia/bulimia, Confinement Anxiety Medical And Surgical History Notes Ear/Nose/Mouth/Throat Hard of Hearing Review of Systems (ROS) Hematologic/Lymphatic Denies complaints or symptoms of Bleeding / Clotting Disorders, Human Immunodeficiency Virus. Respiratory Denies complaints or symptoms of Chronic or frequent coughs, Shortness of Breath. Cardiovascular Complains or has symptoms of LE edema. Denies complaints or symptoms of Chest pain. Gastrointestinal Denies complaints or symptoms of Frequent diarrhea, Nausea, Vomiting. Endocrine Denies complaints or symptoms of Hepatitis, Thyroid disease, Polydypsia (Excessive Thirst). Genitourinary Denies complaints or symptoms of Kidney failure/ Dialysis, Incontinence/dribbling. Immunological Denies complaints or symptoms of Hives, Itching. Integumentary (Skin) Complains or has symptoms of Wounds. Denies complaints or symptoms of Bleeding or bruising tendency, Breakdown, Swelling. Musculoskeletal Denies complaints or symptoms of Muscle Pain, Muscle Weakness. Neurologic Denies complaints or symptoms of Numbness/parasthesias, Focal/Weakness. Psychiatric Denies complaints or symptoms of Anxiety, Claustrophobia. Objective Constitutional sitting or standing blood pressure is within target range for patient.. pulse regular and within target range for  patient.[name] respirations regular, non- labored and within target range for patient.[name] temperature within target range for patient.. Well-nourished and well-hydrated in no acute distress. Vitals Time Taken: 9:35 AM, Temperature: 98.3 F, Pulse: 78 bpm, Respiratory Rate: 18 breaths/min, Blood Pressure: 115/60 mmHg. Eyes conjunctiva clear no eyelid edema noted. pupils equal round and reactive to light and accommodation. Byas, Fredie L. ([id number]) Ears, Nose, Mouth, and Throat no gross abnormality of ear auricles or external auditory canals. normal hearing noted during conversation. mucus membranes moist. Respiratory normal breathing without difficulty. Cardiovascular 2+ dorsalis pedis/posterior tibialis pulses. 1+ pitting edema of the bilateral lower extremities. Musculoskeletal normal gait and posture. no significant deformity or arthritic changes, no loss or range of motion, no clubbing. Psychiatric this patient is able to make decisions and demonstrates good insight into disease process. Alert and Oriented x 3. pleasant and cooperative. General Notes: Upon inspection patient did have significant drainage  from the largest of the wounds on his lower extremity I did actually did not need to perform sharp debridement today which was great news although I think that this may just be more due to the swelling I am going to culture the drainage to ensure that there is nothing infection wise that could be occurring here as well. He does have some mild pitting edema though his pulses seem to be okay at this point. No ABI was performed simply due to the fact that I think it would be too painful for him. Integumentary (Hair, Skin) Wound #3 status is Open. Original cause of wound was Trauma. The wound is located on the Left,Anterior Lower Leg. The wound measures 4cm length x 2cm width x 0.1cm depth; 6.283cm^2 area and 0.628cm^3 volume. There is Fat Layer (Subcutaneous Tissue) Exposed exposed. There is no  tunneling or undermining noted. There is a medium amount of serous drainage noted. The wound margin is flat and intact. There is medium (34-66%) pink granulation within the wound bed. There is a medium (34-66%) amount of necrotic tissue within the wound bed including Adherent Slough. Wound #4 status is Open. Original cause of wound was Trauma. The wound is located on the [facility] Lower Leg. The wound measures 0.8cm length x 1.1cm width x 0.1cm depth; 0.691cm^2 area and 0.069cm^3 volume. There is Fat Layer (Subcutaneous Tissue) Exposed exposed. There is no tunneling or undermining noted. There is a medium amount of serous drainage noted. The wound margin is flat and intact. There is medium (34-66%) pink granulation within the wound bed. There is a medium (34-66%) amount of necrotic tissue within the wound bed including Adherent Slough. Wound #5 status is Open. Original cause of wound was Trauma. The wound is located on the Right Upper Arm. The wound measures 0.4cm length x 1cm width x 0.1cm depth; 0.314cm^2 area and 0.031cm^3 volume. There is Fat Layer (Subcutaneous Tissue) Exposed exposed. There is no tunneling or undermining noted. There is a medium amount of serous drainage noted. The wound margin is flat and intact. There is medium (34-66%) pink granulation within the wound bed. There is a medium (34-66%) amount of necrotic tissue within the wound bed including Adherent Slough. 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 There was a Chemical/Enzymatic/Mechanical debridement  performed by STONE III, Finn Amos E., PA-C. With the following instrument(s): gauze and saline. Other agent used was Gauze and saline. A time out was conducted at 10:15, prior to the start of the procedure. A Minimum amount of bleeding was controlled with Pressure. The procedure was tolerated well. Post Debridement Measurements: 4cm length x 2cm width x 0.1cm depth; 0.628cm^3 volume. Character of Wound/Ulcer Post Debridement is stable. Post procedure Diagnosis Wound #3: Same as Pre-Procedure Wound #4 Pre-procedure diagnosis of Wound #4 is a Skin Tear located on the Left,Distal,Anterior Lower Leg . There was a Chemical/Enzymatic/Mechanical debridement performed by STONE III, Kalla Watson E., PA-C. With the following instrument(s): gauze and saline. Other agent used was Gauze and saline. A time out was conducted at 10:15, prior to the start of the procedure. A Minimum amount of bleeding was controlled with Pressure. The procedure was tolerated well. Post Debridement Measurements:  0.8cm length x 1.1cm width x 0.1cm depth; 0.069cm^3 volume. Character of Wound/Ulcer Post Debridement is stable. KENNETH, CUARESMA ([id number]) Post procedure Diagnosis Wound #4: Same as Pre-Procedure Plan Wound Cleansing: Wound #3 Left,Anterior Lower Leg: Cleanse wound with mild soap and water Wound #4 Left,Distal,Anterior Lower Leg: Cleanse wound with mild soap and water Anesthetic (add to Medication List): Wound #3 Left,Anterior Lower Leg: Topical Lidocaine 4% cream applied to wound bed prior to debridement (In Clinic Only). Wound #4 Left,Distal,Anterior Lower Leg: Topical Lidocaine 4% cream applied to wound bed prior to debridement (In Clinic Only). Primary Wound Dressing: Wound #3 Left,Anterior Lower Leg: Silver Alginate Wound #4 Left,Distal,Anterior Lower Leg: Silver Alginate Secondary Dressing: Wound #3 Left,Anterior Lower Leg: Conform/Kerlix Wound #4 Left,Distal,Anterior Lower Leg: Conform/Kerlix Dressing Change  Frequency: Wound #3 Left,Anterior Lower Leg: Change Dressing Monday, Wednesday, Friday Wound #4 Left,Distal,Anterior Lower Leg: Change Dressing Monday, Wednesday, Friday Follow-up Appointments: Wound #3 Left,Anterior Lower Leg: Return Appointment in 1 week. Wound #4 Left,Distal,Anterior Lower Leg: Return Appointment in 1 week. Edema Control: Wound #3 Left,Anterior Lower Leg: Other: - Tubi-grip F Wound #4 Left,Distal,Anterior Lower Leg: Other: - Tubi-grip F Additional Orders / Instructions: Wound #3 Left,Anterior Lower Leg: Activity as tolerated Wound #4 Left,Distal,Anterior Lower Leg: Activity as tolerated Laboratory ordered were: Wound culture routine - Left lower leg The following medication(s) was prescribed: Cipro oral 500 mg tablet 1 1 tablet oral taken 2 times per day for 10 days starting 03/01/2020 1. I would recommend currently that we go ahead and initiate treatment with a silver alginate dressing which I will be best considering the amount of drainage she has. 2. Also can recommend at this time that we go ahead and continue with some mild compression after wrapping this with an ABD pad and roll gauze were then going to use Tubigrip in order to help hold everything in place and hopefully control some of the edema as well. 3. I do recommend he needs to elevate his legs much as possible try to keep the edema under better control. He understands this as well. We will see patient back for reevaluation in 1 week here in the clinic. If anything worsens or changes patient will contact our office for additional recommendations. Electronic Signature(s) Signed: 03/01/2020 9:09:40 AM By: Lenda Kelp PA-C Previous Signature: 02/26/2020 4:11:16 PM Version By: Lenda Kelp PA-C Entered By: Lenda Kelp on 03/01/2020 09:09:39 Rossel, [name] ([id number]) -------------------------------------------------------------------------------- ROS/PFSH Details Patient Name: Zenaida Deed  L. Date of Service: 02/26/2020 9:15 AM Medical Record Number: [id number] Patient Account Number: [id number] Date of Birth/Sex: 01-May-1941 (79 y.o. M) Treating RN: Curtis Sites Primary Care Provider: Windle Guard Other Clinician: Referring Provider: Referral, Self Treating Provider/Extender: Linwood Dibbles, Brendon Christoffel Weeks in Treatment: 0 Information Obtained From Patient Hematologic/Lymphatic Complaints and Symptoms: Negative for: Bleeding / Clotting Disorders; Human Immunodeficiency Virus Medical History: Negative for: Anemia; Hemophilia; Human Immunodeficiency Virus; Lymphedema; Sickle Cell Disease Respiratory Complaints and Symptoms: Negative for: Chronic or frequent coughs; Shortness of Breath Medical History: Negative for: Aspiration; Asthma; Chronic Obstructive Pulmonary Disease (COPD); Pneumothorax; Sleep Apnea; Tuberculosis Cardiovascular Complaints and Symptoms: Positive for: LE edema Negative for: Chest pain Medical History: Positive for: Arrhythmia; Coronary Artery Disease; Hypertension; Myocardial Infarction - 2000; Peripheral Arterial Disease; Peripheral Venous Disease Gastrointestinal Complaints and Symptoms: Negative for: Frequent diarrhea; Nausea; Vomiting Medical History: Negative for: Cirrhosis ; Colitis; Crohnos; Hepatitis A; Hepatitis B; Hepatitis C Endocrine Complaints and Symptoms: Negative for: Hepatitis; Thyroid disease; Polydypsia (Excessive Thirst) Medical History: Negative for: Type  I Diabetes; Type II Diabetes Genitourinary Complaints and Symptoms: Negative for: Kidney failure/ Dialysis; Incontinence/dribbling Medical History: Negative for: End Stage Renal Disease Immunological Complaints and Symptoms: Negative for: Hives; Itching Medical History: Negative for: Lupus Erythematosus; Raynaudos; Scleroderma Shonka, Gerrell L. ([id number]) Integumentary (Skin) Complaints and Symptoms: Positive for: Wounds Negative for: Bleeding or bruising tendency;  Breakdown; Swelling Medical History: Negative for: History of Burn; History of pressure wounds Musculoskeletal Complaints and Symptoms: Negative for: Muscle Pain; Muscle Weakness Medical History: Positive for: Gout; Osteoarthritis Negative for: Rheumatoid Arthritis; Osteomyelitis Neurologic Complaints and Symptoms: Negative for: Numbness/parasthesias; Focal/Weakness Medical History: Negative for: Dementia; Neuropathy; Quadriplegia; Paraplegia; Seizure Disorder Psychiatric Complaints and Symptoms: Negative for: Anxiety; Claustrophobia Medical History: Negative for: Anorexia/bulimia; Confinement Anxiety Eyes Medical History: Positive for: Cataracts - bilateral removal; Glaucoma Negative for: Optic Neuritis Ear/Nose/Mouth/Throat Medical History: Negative for: Chronic sinus problems/congestion; Middle ear problems Past Medical History Notes: Hard of Hearing Oncologic Medical History: Negative for: Received Chemotherapy; Received Radiation HBO Extended History Items Eyes: Eyes: Cataracts Glaucoma Immunizations Pneumococcal Vaccine: Received Pneumococcal Vaccination: Yes Implantable Devices Yes Family and Social History Cancer: No; Diabetes: No; Heart Disease: Yes - Father,Siblings; Hereditary Spherocytosis: No; Hypertension: Yes - Siblings; Kidney Disease: No; Lung Disease: No; Seizures: No; Stroke: No; Thyroid Problems: No; Tuberculosis: No; Never smoker; Marital Status - Widowed; Alcohol Use: Never; Drug Use: No History; Caffeine Use: Rarely; Financial Concerns: No; Food, Clothing or Shelter Needs: No; Support System Lacking: No; Transportation Concerns: No NAVON, KOTOWSKI ([id number]) Electronic Signature(s) Signed: 02/26/2020 4:16:09 PM By: Lenda Kelp PA-C Signed: 02/26/2020 4:39:14 PM By: Curtis Sites Entered By: Curtis Sites on 02/26/2020 10:16:11 Serda, [name]  ([id number]) -------------------------------------------------------------------------------- SuperBill Details Patient Name: Koop, Suvan L. Date of Service: 02/26/2020 Medical Record Number: [id number] Patient Account Number: [id number] Date of Birth/Sex: [birth year] (79 y.o. M) Treating RN: Huel Coventry Primary Care Provider: Windle Guard Other Clinician: Referring Provider: Referral, Self Treating Provider/Extender: Linwood Dibbles, Jeffie Spivack Weeks in Treatment: 0 Diagnosis Coding ICD-10 Codes Code Description S41.101A Unspecified open wound of right upper arm, initial encounter S81.802A Unspecified open wound, left lower leg, initial encounter I10 Essential (primary) hypertension Z79.01 Long term (current) use of anticoagulants Z95.0 Presence of cardiac pacemaker I25.10 Atherosclerotic heart disease of native coronary artery without angina pectoris Facility Procedures CPT4 Code: 62952841 Description: [phone] - WOUND CARE VISIT-LEV 5 EST PT Modifier: Quantity: 1 Physician Procedures CPT4 Code: [id number] Description: 99214 - WC PHYS LEVEL 4 - EST PT Modifier: Quantity: 1 CPT4 Code: Description: ICD-10 Diagnosis Description S41.101A Unspecified open wound of right upper arm, initial encounter S81.802A Unspecified open wound, left lower leg, initial encounter I10 Essential (primary) hypertension Z79.01 Long term (current) use of  anticoagulants Modifier: Quantity: Electronic Signature(s) Signed: 02/26/2020 4:11:41 PM By: Lenda Kelp PA-C Entered By: Lenda Kelp on 02/26/2020 16:11:41

## 2020-02-27 NOTE — Progress Notes (Signed)
OSTEN, JANEK ([id number]) Visit Report for 02/26/2020 Allergy List Details Patient Name: Herd, JACOBO L. Date of Service: 02/26/2020 9:15 AM Medical Record Number: [id number] Patient Account Number: [id number] Date of Birth/Sex: Apr 18, 1941 (79 y.o. M) Treating RN: Curtis Sites Primary Care Quinntin Malter: Windle Guard Other Clinician: Referring Taurus Willis: Referral, Self Treating Rucha Wissinger/Extender: STONE III, HOYT Weeks in Treatment: 0 Allergies Active Allergies No Known Drug Allergies Allergy Notes Electronic Signature(s) Signed: 02/26/2020 10:14:16 AM By: Curtis Sites Entered By: Curtis Sites on 02/26/2020 10:14:16 Barrero, [name] ([id number]) -------------------------------------------------------------------------------- Arrival Information Details Patient Name: [name] Date of Service: 02/26/2020 9:15 AM Medical Record Number: [id number] Patient Account Number: [id number] Date of Birth/Sex: 03-Apr-1941 (79 y.o. M) Treating RN: Curtis Sites Primary Care Karlynn Furrow: Windle Guard Other Clinician: Referring Annetta Deiss: Referral, Self Treating Jencarlos Nicolson/Extender: Linwood Dibbles, HOYT Weeks in Treatment: 0 Visit Information Patient Arrived: Ambulatory Arrival Time: 09:30 Accompanied By: self Transfer Assistance: None Patient Identification Verified: Yes Secondary Verification Process Completed: Yes Patient Has Alerts: Yes Patient Alerts: Patient on Blood Thinner Eliquis History Since Last Visit Added or deleted any medications: No Any new allergies or adverse reactions: No Had a fall or experienced change in activities of daily living that may affect risk of falls: No Signs or symptoms of abuse/neglect since last visito No Hospitalized since last visit: No Implantable device outside of the clinic excluding cellular tissue based products placed in the center since last visit: No Electronic Signature(s) Signed: 02/26/2020 10:08:48 AM By: Curtis Sites Entered By: Curtis Sites on 02/26/2020 10:08:48 Marini, [name] ([id number]) -------------------------------------------------------------------------------- Clinic Level of Care Assessment Details Patient Name: Masur, Darius L. Date of Service: 02/26/2020 9:15 AM Medical Record Number: [id number] Patient Account Number: [id number] Date of Birth/Sex: Jul 03, 1941 (79 y.o. M) Treating RN: Huel Coventry Primary Care Meeya Goldin: Windle Guard Other Clinician: Referring Andrew Soria: Referral, Self Treating Nayeliz Hipp/Extender: Linwood Dibbles, HOYT Weeks in Treatment: 0 Clinic Level of Care Assessment Items TOOL 2 Quantity Score []  - Use when only an EandM is performed on the INITIAL visit 0 ASSESSMENTS - Nursing Assessment / Reassessment X - General Physical Exam (combine w/ comprehensive assessment (listed just below) when performed on new 1 20 pt. evals) X- 1 25 Comprehensive Assessment (HX, ROS, Risk Assessments, Wounds Hx, etc.) ASSESSMENTS - Wound and Skin Assessment / Reassessment []  - Simple Wound Assessment / Reassessment - one wound 0 X- 3 5 Complex Wound Assessment / Reassessment - multiple wounds []  - 0 Dermatologic / Skin Assessment (not related to wound area) ASSESSMENTS - Ostomy and/or Continence Assessment and Care []  - Incontinence Assessment and Management 0 []  - 0 Ostomy Care Assessment and Management (repouching, etc.) PROCESS - Coordination of Care X - Simple Patient / Family Education for ongoing care 1 15 []  - 0 Complex (extensive) Patient / Family Education for ongoing care X- 1 10 Staff obtains , Records, Test Results / Process Orders []  - 0 Staff telephones HHA, Nursing Homes / Clarify orders / etc []  - 0 Routine Transfer to another Facility (non-emergent condition) []  - 0 Routine Hospital Admission (non-emergent condition) []  - 0 New Admissions / / Ordering NPWT, Apligraf, etc. []  - 0 Emergency Hospital Admission (emergent condition) X- 1 10 Simple  Discharge Coordination []  - 0 Complex (extensive) Discharge Coordination PROCESS - Special Needs []  - Pediatric / Minor Patient Management 0 []  - 0 Isolation Patient Management []  - 0 Hearing / Language / Visual special needs []  - 0 Assessment of Community assistance (transportation, D/C planning, etc.) []  -  0 Additional assistance / Altered mentation []  - 0 Support Surface(s) Assessment (bed, cushion, seat, etc.) INTERVENTIONS - Wound Cleansing / Measurement X - Wound Imaging (photographs - any number of wounds) 1 5 []  - 0 Wound Tracing (instead of photographs) []  - 0 Simple Wound Measurement - one wound X- 3 5 Complex Wound Measurement - multiple wounds Sinning, Jasmin L. ( ) []  - 0 Simple Wound Cleansing - one wound X- 3 5 Complex Wound Cleansing - multiple wounds INTERVENTIONS - Wound Dressings X - Small Wound Dressing one or multiple wounds 1 10 []  - 0 Medium Wound Dressing one or multiple wounds X- 1 20 Large Wound Dressing one or multiple wounds []  - 0 Application of Medications - injection INTERVENTIONS - Miscellaneous []  - External ear exam 0 X- 1 5 Specimen Collection (cultures, biopsies, blood, body fluids, etc.) X- 1 5 Specimen(s) / Culture(s) sent or taken to Lab for analysis []  - 0 Patient Transfer (multiple staff / / Similar devices) []  - 0 Simple Staple / Suture removal (25 or less) []  - 0 Complex Staple / Suture removal (26 or more) []  - 0 Hypo / Hyperglycemic Management (close monitor of Blood Glucose) []  - 0 Ankle / Brachial Index (ABI) - do not check if billed separately Has the patient been seen at the hospital within the last three years: Yes Total Score: 170 Level Of Care: New/Established - Level 5 Electronic Signature(s) Signed: 02/26/2020 4:54:26 PM By: [id number], BSN, RN, CWS, Kim RN, BSN Entered By: , BSN, RN, CWS, Kim on 02/26/2020 10:18:40 Davtyan,  ( ) -------------------------------------------------------------------------------- Encounter Discharge Information Details Patient Name: Jasmin, Talbot L. Date of Service: 02/26/2020 9:15 AM Medical Record Number: Nurse, adult Patient Account Number: Date of Birth/Sex: [birth year] (79 y.o. M) Treating RN: Primary Care Steen Bisig: 02/28/2020 Other Clinician: Referring Emeterio Balke: Referral, Self Treating Lyle Niblett/Extender: Elliot Gurney, HOYT Weeks in Treatment: 0 Encounter Discharge Information Items Post Procedure Vitals Discharge Condition: Stable Unable to obtain vitals Reason: . Ambulatory Status: Ambulatory Discharge Destination: Home Transportation: Private Auto Accompanied By: self Schedule Follow-up Appointment: Yes Clinical Summary of Care: Electronic Signature(s) Signed: 02/26/2020 4:54:26 PM By: 02/28/2020, BSN, RN, CWS, Kim RN, BSN Entered By: [name], BSN, RN, CWS, Kim on 02/26/2020 10:26:29 Full, 02/28/2020 ([id number]) -------------------------------------------------------------------------------- Lower Extremity Assessment Details Patient Name: Standlee, Esaul L. Date of Service: 02/26/2020 9:15 AM Medical Record Number: 10/31/1940 Patient Account Number: [id number] Date of Birth/Sex: 04-Nov-1940 (79 y.o. M) Treating RN: Linwood Dibbles Primary Care Demita Tobia: 02/28/2020 Other Clinician: Referring Annabel Gibeau: Referral, Self Treating Koralee Wedeking/Extender: STONE III, HOYT Weeks in Treatment: 0 Edema Assessment Assessed: [Left: No] [Right: No] Edema: [Left: Ye] [Right: s] Calf Left: Right: Point of Measurement: 36 cm From Medial Instep cm 35 cm Ankle Left: Right: Point of Measurement: 13 cm From Medial Instep cm 23.6 cm Vascular Assessment Pulses: Dorsalis Pedis Palpable: [Right:Yes] Doppler Audible: [Right:Yes] Posterior Tibial Palpable: [Right:Yes Yes] Notes NO ABIs r/t pain and wound location Electronic Signature(s) Signed: 02/26/2020 10:14:05  AM By: Elliot Gurney Entered By: 02/28/2020 on 02/26/2020 10:14:05 [place]orens, [id number] (02/28/2020) -------------------------------------------------------------------------------- Multi Wound Chart Details Patient Name: Wagster, Borden L. Date of Service: 02/26/2020 9:15 AM Medical Record Number: [id number] Patient Account Number: 10/31/1940 Date of Birth/Sex: 07/03/1941 (79 y.o. M) Treating RN: Windle Guard Primary Care Tarnisha Kachmar: 02/28/2020 Other Clinician: Referring Lance Huaracha: Referral, Self Treating Norval Slaven/Extender: Curtis Sites, HOYT Weeks in Treatment: 0 Vital Signs Height(in): Pulse(bpm): 78 Weight(lbs): Blood Pressure(mmHg): 115/60 Body Mass Index(BMI): Temperature(F): 98.3  Respiratory Rate(breaths/min): 18 Photos: Wound Location: Left, Anterior Lower Leg Left, Distal, Anterior Lower Leg Right Upper Arm Wounding Event: Trauma Trauma Trauma Primary Etiology: Trauma, Other Skin Tear Skin Tear Comorbid History: Cataracts, Glaucoma, Arrhythmia, Cataracts, Glaucoma, Arrhythmia, Cataracts, Glaucoma, Arrhythmia, Coronary Artery Disease, Coronary Artery Disease, Coronary Artery Disease, Hypertension, Myocardial Infarction, Hypertension, Myocardial Infarction, Hypertension, Myocardial Infarction, Peripheral Arterial Disease, Peripheral Arterial Disease, Peripheral Arterial Disease, Peripheral Venous Disease, Gout, Peripheral Venous Disease, Gout, Peripheral Venous Disease, Gout, Osteoarthritis Osteoarthritis Osteoarthritis Date Acquired: 02/18/2020 02/18/2020 02/18/2020 Weeks of Treatment: 0 0 0 Wound Status: Open Open Open Measurements L x W x D (cm) 4x2x0.1 0.8x1.1x0.1 0.4x1x0.1 Area (cm) : 6.283 0.691 0.314 Volume (cm) : 0.628 0.069 0.031 % Reduction in Area: 0.00% 0.00% 0.00% % Reduction in Volume: 0.00% 0.00% 0.00% Classification: Full Thickness Without Exposed Full Thickness Without Exposed Full Thickness Without Exposed Support Structures Support Structures Support  Structures Exudate Amount: Medium Medium Medium Exudate Type: Serous Serous Serous Exudate Color: amber amber amber Wound Margin: Flat and Intact Flat and Intact Flat and Intact Granulation Amount: Medium (34-66%) Medium (34-66%) Medium (34-66%) Granulation Quality: Pink Pink Pink Necrotic Amount: Medium (34-66%) Medium (34-66%) Medium (34-66%) Exposed Structures: Fat Layer (Subcutaneous Tissue) Fat Layer (Subcutaneous Tissue) Fat Layer (Subcutaneous Tissue) Exposed: Yes Exposed: Yes Exposed: Yes Fascia: No Fascia: No Fascia: No Tendon: No Tendon: No Tendon: No Muscle: No Muscle: No Muscle: No Joint: No Joint: No Joint: No Bone: No Bone: No Bone: No Epithelialization: None None None Debridement: Chemical/Enzymatic/Mechanical Chemical/Enzymatic/Mechanical N/A Pre-procedure Verification/Time 10:15 10:15 N/A Out Taken: Instrument: Other(gauze and saline) Other(gauze and saline) N/A Bleeding: Minimum Minimum N/A Hemostasis Achieved: Pressure Pressure N/A Debridement Treatment Procedure was tolerated well Procedure was tolerated well N/A Response: 4x2x0.1 0.8x1.1x0.1 N/A Cosens, Euell L. ([id number]) Post Debridement Measurements L x W x D (cm) Post Debridement Volume: 0.628 0.069 N/A (cm) Procedures Performed: Debridement Debridement N/A Treatment Notes Wound #3 (Left, Anterior Lower Leg) Notes coverlet arm, Left lower leg scell, abd, conform, tubi f Wound #4 (Left, Distal, Anterior Lower Leg) Notes coverlet arm, Left lower leg scell, abd, conform, tubi f Wound #5 (Right Upper Arm) Notes coverlet arm, Left lower leg scell, abd, conform, tubi f Electronic Signature(s) Signed: 02/26/2020 12:58:33 PM By: Elliot Gurney, BSN, RN, CWS, Kim RN, BSN Entered By: Elliot Gurney, BSN, RN, CWS, Kim on 02/26/2020 12:58:32 Hayne, [name] ([id number]) -------------------------------------------------------------------------------- Multi-Disciplinary Care Plan Details Patient Name: Lett,  Clemmie L. Date of Service: 02/26/2020 9:15 AM Medical Record Number: [id number] Patient Account Number: [id number] Date of Birth/Sex: Jun 17, 1941 (79 y.o. M) Treating RN: Huel Coventry Primary Care Ziair Penson: Windle Guard Other Clinician: Referring Kamyiah Colantonio: Referral, Self Treating Teegan Guinther/Extender: Linwood Dibbles, HOYT Weeks in Treatment: 0 Active Inactive Abuse / Safety / Falls / Self Care Management Nursing Diagnoses: History of Falls Goals: Patient will not experience any injury related to falls Date Initiated: 02/26/2020 Target Resolution Date: 03/27/2020 Goal Status: Active Patient will remain injury free related to falls Date Initiated: 02/26/2020 Target Resolution Date: 03/27/2020 Goal Status: Active Interventions: Assess fall risk on admission and as needed Notes: Orientation to the Wound Care Program Nursing Diagnoses: Knowledge deficit related to the wound healing center program Goals: Patient/caregiver will verbalize understanding of the Wound Healing Center Program Date Initiated: 02/26/2020 Target Resolution Date: 02/26/2020 Goal Status: Active Interventions: Provide education on orientation to the wound center Notes: Pain, Acute or Chronic Nursing Diagnoses: Pain Management - Cyclic Acute (Dressing Change Related) Pain Management - Non-cyclic Acute (Procedural) Goals: Patient will verbalize adequate pain  control and receive pain control interventions during procedures as needed Date Initiated: 02/26/2020 Target Resolution Date: 03/27/2020 Goal Status: Active Interventions: Assess comfort goal upon admission Treatment Activities: Administer pain control measures as ordered : 02/26/2020 Notes: Wound/Skin Impairment Nursing Diagnoses: JAYJAY, LITTLES ([id number]) Impaired tissue integrity Goals: Patient/caregiver will verbalize understanding of skin care regimen Date Initiated: 02/26/2020 Target Resolution Date: 02/26/2020 Goal Status: Active Ulcer/skin breakdown  will have a volume reduction of 30% by week 4 Date Initiated: 02/26/2020 Target Resolution Date: 03/27/2020 Goal Status: Active Interventions: Assess patient/caregiver ability to obtain necessary supplies Assess patient/caregiver ability to perform ulcer/skin care regimen upon admission and as needed Assess ulceration(s) every visit Treatment Activities: Skin care regimen initiated : 02/26/2020 Topical wound management initiated : 02/26/2020 Notes: Electronic Signature(s) Signed: 02/26/2020 4:54:26 PM By: Elliot Gurney, BSN, RN, CWS, Kim RN, BSN Entered By: Elliot Gurney, BSN, RN, CWS, Kim on 02/26/2020 10:13:23 Wishon, [name] ([id number]) -------------------------------------------------------------------------------- Pain Assessment Details Patient Name: Moulton, Giovan L. Date of Service: 02/26/2020 9:15 AM Medical Record Number: [id number] Patient Account Number: [id number] Date of Birth/Sex: 08/11/1941 (79 y.o. M) Treating RN: Curtis Sites Primary Care Dusty Wagoner: Windle Guard Other Clinician: Referring Serenitie Vinton: Referral, Self Treating Derian Pfost/Extender: STONE III, HOYT Weeks in Treatment: 0 Active Problems Location of Pain Severity and Description of Pain Patient Has Paino Yes Site Locations Pain Location: Pain in Ulcers With Dressing Change: Yes Duration of the Pain. Constant / Intermittento Constant Rate the pain. Current Pain Level: 6 Character of Pain Describe the Pain: Aching Pain Management and Medication Current Pain Management: Electronic Signature(s) Signed: 02/26/2020 10:09:11 AM By: Curtis Sites Entered By: Curtis Sites on 02/26/2020 10:09:11 Constancio, [name] ([id number]) -------------------------------------------------------------------------------- Patient/Caregiver Education Details Patient Name: Hendler, Mister L. Date of Service: 02/26/2020 9:15 AM Medical Record Number: [id number] Patient Account Number: [id number] Date of Birth/Gender: April 28, 1941 (79 y.o.  M) Treating RN: Huel Coventry Primary Care Physician: Windle Guard Other Clinician: Referring Physician: Referral, Self Treating Physician/Extender: Skeet Simmer in Treatment: 0 Education Assessment Education Provided To: Patient Education Topics Provided Safety: Handouts: Medication Safety Methods: Demonstration, Explain/Verbal Responses: State content correctly Welcome To The Wound Care Center: Handouts: Welcome To The Wound [facility] Methods: Demonstration, Explain/Verbal Responses: State content correctly Wound/Skin Impairment: Handouts: Caring for Your Ulcer Methods: Demonstration, Explain/Verbal Responses: State content correctly Electronic Signature(s) Signed: 02/26/2020 4:54:26 PM By: Elliot Gurney, BSN, RN, CWS, Kim RN, BSN Entered By: Elliot Gurney, BSN, RN, CWS, Kim on 02/26/2020 10:20:02 Alderman, [name] ([id number]) -------------------------------------------------------------------------------- Wound Assessment Details Patient Name: Keziah, Deshun L. Date of Service: 02/26/2020 9:15 AM Medical Record Number: [id number] Patient Account Number: [id number] Date of Birth/Sex: 02-21-1941 (78 y.o. M) Treating RN: Curtis Sites Primary Care Nobuko Gsell: Windle Guard Other Clinician: Referring Kaidynce Pfister: Referral, Self Treating Asriel Westrup/Extender: STONE III, HOYT Weeks in Treatment: 0 Wound Status Wound Number: 3 Primary Trauma, Other Etiology: Wound Location: Left, Anterior Lower Leg Wound Open Wounding Event: Trauma Status: Date Acquired: 02/18/2020 Comorbid Cataracts, Glaucoma, Arrhythmia, Coronary Artery Weeks Of Treatment: 0 History: Disease, Hypertension, Myocardial Infarction, Peripheral Clustered Wound: No Arterial Disease, Peripheral Venous Disease, Gout, Osteoarthritis Photos Wound Measurements Length: (cm) 4 Width: (cm) 2 Depth: (cm) 0.1 Area: (cm) 6.283 Volume: (cm) 0.628 % Reduction in Area: 0% % Reduction in Volume: 0% Epithelialization:  None Tunneling: No Undermining: No Wound Description Classification: Full Thickness Without Exposed Support Struct Wound Margin: Flat and Intact Exudate Amount: Medium Exudate Type: Serous Exudate Color: amber ures Foul Odor After Cleansing: No Slough/Fibrino Yes Wound Bed Granulation Amount: Medium (34-66%) Exposed  Structure Granulation Quality: Pink Fascia Exposed: No Necrotic Amount: Medium (34-66%) Fat Layer (Subcutaneous Tissue) Exposed: Yes Necrotic Quality: Adherent Slough Tendon Exposed: No Muscle Exposed: No Joint Exposed: No Bone Exposed: No Treatment Notes Wound #3 (Left, Anterior Lower Leg) Notes coverlet arm, Left lower leg scell, abd, conform, tubi f Hayden, Oseas L. ([id number]) Electronic Signature(s) Signed: 02/26/2020 10:18:35 AM By: Sandre Kitty Signed: 02/26/2020 4:39:14 PM By: Montey Hora Previous Signature: 02/26/2020 10:11:12 AM Version By: Montey Hora Entered By: Sandre Kitty on 02/26/2020 10:16:54 Ausborn, Janine Ores ([id number]) -------------------------------------------------------------------------------- Wound Assessment Details Patient Name: Grigg, Jeffry L. Date of Service: 02/26/2020 9:15 AM Medical Record Number: [id number] Patient Account Number: [id number] Date of Birth/Sex: [birth year] (79 y.o. M) Treating RN: Montey Hora Primary Care Azavion Bouillon: Claris Gower Other Clinician: Referring Nalina Yeatman: Referral, Self Treating Kamari Buch/Extender: STONE III, HOYT Weeks in Treatment: 0 Wound Status Wound Number: 4 Primary Skin Tear Etiology: Wound Location: Left, Distal, Anterior Lower Leg Wound Open Wounding Event: Trauma Status: Date Acquired: 02/18/2020 Comorbid Cataracts, Glaucoma, Arrhythmia, Coronary Artery Weeks Of Treatment: 0 History: Disease, Hypertension, Myocardial Infarction, Peripheral Clustered Wound: No Arterial Disease, Peripheral Venous Disease, Gout, Osteoarthritis Photos Wound Measurements Length: (cm)  0.8 Width: (cm) 1.1 Depth: (cm) 0.1 Area: (cm) 0.691 Volume: (cm) 0.069 % Reduction in Area: 0% % Reduction in Volume: 0% Epithelialization: None Tunneling: No Undermining: No Wound Description Classification: Full Thickness Without Exposed Support Struct Wound Margin: Flat and Intact Exudate Amount: Medium Exudate Type: Serous Exudate Color: amber ures Foul Odor After Cleansing: No Slough/Fibrino Yes Wound Bed Granulation Amount: Medium (34-66%) Exposed Structure Granulation Quality: Pink Fascia Exposed: No Necrotic Amount: Medium (34-66%) Fat Layer (Subcutaneous Tissue) Exposed: Yes Necrotic Quality: Adherent Slough Tendon Exposed: No Muscle Exposed: No Joint Exposed: No Bone Exposed: No Treatment Notes Wound #4 (Left, Distal, Anterior Lower Leg) Notes coverlet arm, Left lower leg scell, abd, conform, tubi f Steffy, Xavian L. ([id number]) Electronic Signature(s) Signed: 02/26/2020 10:18:35 AM By: Sandre Kitty Signed: 02/26/2020 4:39:14 PM By: Montey Hora Previous Signature: 02/26/2020 10:12:09 AM Version By: Montey Hora Entered By: Sandre Kitty on 02/26/2020 10:17:15 Wanat, Janine Ores ([id number]) -------------------------------------------------------------------------------- Wound Assessment Details Patient Name: Benzel, Kaeleb L. Date of Service: 02/26/2020 9:15 AM Medical Record Number: [id number] Patient Account Number: [id number] Date of Birth/Sex: 02/12/1941 (79 y.o. M) Treating RN: Montey Hora Primary Care Jarrett Albor: Claris Gower Other Clinician: Referring Tomothy Eddins: Referral, Self Treating Ha Shannahan/Extender: STONE III, HOYT Weeks in Treatment: 0 Wound Status Wound Number: 5 Primary Skin Tear Etiology: Wound Location: Right Upper Arm Wound Open Wounding Event: Trauma Status: Date Acquired: 02/18/2020 Comorbid Cataracts, Glaucoma, Arrhythmia, Coronary Artery Weeks Of Treatment: 0 History: Disease, Hypertension, Myocardial Infarction,  Peripheral Clustered Wound: No Arterial Disease, Peripheral Venous Disease, Gout, Osteoarthritis Photos Wound Measurements Length: (cm) 0.4 Width: (cm) 1 Depth: (cm) 0.1 Area: (cm) 0.314 Volume: (cm) 0.031 % Reduction in Area: 0% % Reduction in Volume: 0% Epithelialization: None Tunneling: No Undermining: No Wound Description Classification: Full Thickness Without Exposed Support Struct Wound Margin: Flat and Intact Exudate Amount: Medium Exudate Type: Serous Exudate Color: amber ures Foul Odor After Cleansing: No Slough/Fibrino Yes Wound Bed Granulation Amount: Medium (34-66%) Exposed Structure Granulation Quality: Pink Fascia Exposed: No Necrotic Amount: Medium (34-66%) Fat Layer (Subcutaneous Tissue) Exposed: Yes Necrotic Quality: Adherent Slough Tendon Exposed: No Muscle Exposed: No Joint Exposed: No Bone Exposed: No Treatment Notes Wound #5 (Right Upper Arm) Notes coverlet arm, Left lower leg scell, abd, conform, tubi f Messman, Ryman L. ([id number]) Electronic Signature(s) Signed: 02/26/2020 10:18:35 AM  By: Karl Itoawkins, Destiny Signed: 02/26/2020 4:39:14 PM By: Curtis Sitesorthy, Joanna Previous Signature: 02/26/2020 10:13:03 AM Version By: Curtis Sitesorthy, Joanna Entered By: Karl Itoawkins, Destiny on 02/26/2020 10:17:50 Lemire, Ferdinand LangoICHARD L. ([id number]) -------------------------------------------------------------------------------- Vitals Details Patient Name: Hunke, Gaje L. Date of Service: 02/26/2020 9:15 AM Medical Record Number: [id number] Patient Account Number: [id number] Date of Birth/Sex: 1940/11/29 (79 y.o. M) Treating RN: Curtis Sitesorthy, Joanna Primary Care Krisna Omar: Windle GuardELKINS, WILSON Other Clinician: Referring Elnore Cosens: Referral, Self Treating Sherrie Marsan/Extender: STONE III, HOYT Weeks in Treatment: 0 Vital Signs Time Taken: 09:35 Temperature (F): 98.3 Pulse (bpm): 78 Respiratory Rate (breaths/min): 18 Blood Pressure (mmHg): 115/60 Reference Range: 80 - 120 mg / dl Electronic  Signature(s) Signed: 02/26/2020 10:09:39 AM By: Curtis Sitesorthy, Joanna Entered By: Curtis Sitesorthy, Joanna on 02/26/2020 10:09:39

## 2020-02-29 LAB — AEROBIC CULTURE W GRAM STAIN (SUPERFICIAL SPECIMEN): Gram Stain: NONE SEEN

## 2020-03-04 ENCOUNTER — Encounter: Payer: Medicare PPO | Admitting: Physician Assistant

## 2020-03-04 ENCOUNTER — Other Ambulatory Visit: Payer: Self-pay

## 2020-03-04 DIAGNOSIS — S81801A Unspecified open wound, right lower leg, initial encounter: Secondary | ICD-10-CM | POA: Diagnosis not present

## 2020-03-04 NOTE — Progress Notes (Addendum)
WYNN, ALLDREDGE ([id number]) Visit Report for 03/04/2020 Chief Complaint Document Details Patient Name: [name], James [name]. Date of Service: 03/04/2020 10:45 AM Medical Record Number: [id number] Patient Account Number: [id number] Date of Birth/Sex: 02/18/1941 (79 y.o. M) Treating RN: Huel Coventry Primary Care Provider: Windle Guard Other Clinician: Referring Provider: Windle Guard Treating Provider/Extender: Linwood Dibbles, Cuauhtemoc Huegel Weeks in Treatment: 1 Information Obtained from: Patient Chief Complaint Right arm and left leg ulcer Electronic Signature(s) Signed: 03/04/2020 11:06:12 AM By: Lenda Kelp PA-C Entered By: Lenda Kelp on 03/04/2020 11:06:12 [name], [name] ([id number]) -------------------------------------------------------------------------------- Debridement Details Patient Name: James Deed L. Date of Service: 03/04/2020 10:45 AM Medical Record Number: [id number] Patient Account Number: [id number] Date of Birth/Sex: 1941-04-24 (79 y.o. M) Treating RN: Huel Coventry Primary Care Provider: Windle Guard Other Clinician: Referring Provider: Windle Guard Treating Provider/Extender: Linwood Dibbles, Mabry Santarelli Weeks in Treatment: 1 Debridement Performed for Wound #3 Left,Anterior Lower Leg Assessment: Performed By: Physician James III, Hilaria Titsworth E., PA-C Debridement Type: Debridement Level of Consciousness (Pre- Awake and Alert procedure): Pre-procedure Verification/Time Out Yes - 11:20 Taken: Start Time: 11:20 Pain Control: Lidocaine 4% Topical Solution Total Area Debrided (L x W): 0.5 (cm) x 0.4 (cm) = 0.2 (cm) Tissue and other material Viable, Non-Viable, Slough, Subcutaneous, Slough debrided: Level: Skin/Subcutaneous Tissue Debridement Description: Excisional Instrument: Curette Bleeding: Minimum Hemostasis Achieved: Pressure End Time: 11:23 Procedural Pain: 2 Post Procedural Pain: 0 Response to Treatment: Procedure was tolerated well Level of Consciousness  (Post- Awake and Alert procedure): Post Debridement Measurements of Total Wound Length: (cm) 0.5 Width: (cm) 0.4 Depth: (cm) 0.2 Volume: (cm) 0.031 Character of Wound/Ulcer Post Debridement: Improved Post Procedure Diagnosis Same as Pre-procedure Electronic Signature(s) Signed: 03/04/2020 5:22:45 PM By: Lenda Kelp PA-C Signed: 03/05/2020 12:55:53 PM By: Elliot Gurney, BSN, RN, CWS, Kim RN, BSN Entered By: Lenda Kelp on 03/04/2020 11:25:14 [name], [name] ([id number]) -------------------------------------------------------------------------------- HPI Details Patient Name: [name], [name] Date of Service: 03/04/2020 10:45 AM Medical Record Number: [id number] Patient Account Number: [id number] Date of Birth/Sex: 1941/04/12 (79 y.o. M) Treating RN: Huel Coventry Primary Care Provider: Windle Guard Other Clinician: Referring Provider: Windle Guard Treating Provider/Extender: Linwood Dibbles, Sadaf Przybysz Weeks in Treatment: 1 History of Present Illness HPI Description: 03/18/17 on evaluation today patient presents for initial visit concerning an ulcer he has on the left lateral lower extremity which has been present since mid February 2018. Unfortunately due to other family circumstances evaluation and treatment of this wound has been put on hold until this point when they could finally get him into be seen. He does have high blood pressure but has no history of diabetes though he does tell me that his cardiologist had been monitoring him for hyperglycemia and he is "borderline". He does have some discomfort in regard to this wound but fortunately this is not severe and typically with cleansing. His main concern is why this wound is not healing as in the past he has never had any difficulty with healing. The initial injury was during the period of time where he was helping a friend with some work and scrape the leg on a brick during that process. It has just never healed since that time. Patient is on  chronic anticoagulant therapy and has not had any cardiovascular procedures such as arterial or venous imaging. 03/25/2017 - I'm seeing the patient for the first time today and notes that he has had a injury to the left lower extremity since mid February and from what I understand a hematoma  opened out into a lacerated wound and this has been slow to heal. He has had no history of previous arterial or venous problems and has had some cardiac history with stents placed. He is not a diabetic and not a smoker. During his previous visit there has been a arterial and venous duplex study ordered and this is pending 04/01/2017 -- arterial and venous duplex studies still pending 04/08/2017 -- lower extremity venous duplex reflux evaluation done on 04/02/2017 showed no evidence of venous incompetence in both left and right great saphenous veins. lower extremity arterial duplex evaluation done on 04/06/2017 -- no evidence of hemodynamically significant arterial occlusive disease bilaterally. the right ABI was 1.19 with a toe pressure of 0.83 and the left ABI was 1.25 with a toe pressures of 0.78 and triphasic flow. these were all normal. 04/15/17 on evaluation today patient appears to be doing fairly well in regard to his right lower extremity wound. He has been tolerating the dressing without complication. His biggest thing is that he states he was "hoping that today would be the last visit and he will not have to come back". Nonetheless he still has an open wound noted and I explained to him that we do want to see him back to obviously work toward getting this to close appropriately. Fortunately we did have the results as noted above of his arterial duplex study and venous studies which appear to be doing very well. Overall I do believe he is progressing nicely. No fevers, chills, nausea, or vomiting noted at this time. 04/22/17 on evaluation today patient's left lower from the wound appears to be doing about  the same as last week's evaluation. He also has a superficial skin tear in the lateral periwound that I believe is due to the current dressing that is the [facility] Blue Dressing actually sticking to his skin. I think this could be potentially damaging the wound bed as well. With that being said he is not having any significant pain. No fevers, chills, nausea, or vomiting noted at this time. Readmission: 02/26/2020 patient presents today for reevaluation here clinically has not been seen since August 2018. With that being said he tells me that about a week ago he did sustain an injury to his leg during a fall. Fortunately there is no signs of active infection at this time. No fevers, chills, nausea, vomiting, or diarrhea. He tells me that he has "not been putting antibiotic ointment on it because that will not help it heal". Mainly they have just been wrapping this with nothing on it and cleaning with saline in between. Fortunately there is no signs of obvious infection although I am concerned about that possibility with the drainage she is having I think it may be more due to swelling than anything. He does have a wound on the upper arm as well on the right but this appears to be almost completely healed I am not really concerned about this I think just a protective bandage is all we really need here. 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 03/04/2020 upon evaluation today patient actually appears to be doing quite well with regard to his wounds he is tolerating the dressing changes without complication and overall he is going require some sharp debridement today but this is minimal and for the most part his wounds are healed. Overall I think he is progressing quite nicely.  Electronic Signature(s) Signed: 03/04/2020 2:23:05 PM By: Lenda Kelp PA-C Entered By: Lenda Kelp on 03/04/2020 14:23:04 [name], [name] ([id number]) -------------------------------------------------------------------------------- Physical Exam Details Patient Name: [name], [name] Date of Service: 03/04/2020 10:45 AM Medical Record Number: [id number] Patient Account Number: [id number] Date of Birth/Sex: 03-28-1941 (79 y.o. M) Treating RN: Huel Coventry Primary Care Provider: Windle Guard Other Clinician: Referring Provider: Windle Guard Treating Provider/Extender: James III, Jaedan Huttner Weeks in Treatment: 1 Constitutional Well-nourished and well-hydrated in no acute distress. Respiratory normal breathing without difficulty. Psychiatric this patient is able to make decisions and demonstrates good insight into disease process. Alert and Oriented x 3. pleasant and cooperative. Notes Patient's wound bed currently showed signs of good granulation at this time fortunately there does not appear to be any evidence of infection he did have some slough and drainage buildup on the surface of the wound which did require sharp debridement to clear away today he tolerated that without complication post debridement wound bed appears to be doing much better Electronic Signature(s) Signed: 03/04/2020 2:23:30 PM By: Lenda Kelp PA-C Entered By: Lenda Kelp on 03/04/2020 14:23:30 Humble, [name] ([id number]) -------------------------------------------------------------------------------- Physician Orders Details Patient Name: Hassell, Jareb L. Date of Service: 03/04/2020 10:45 AM Medical Record Number: [id number] Patient Account Number: [id number] Date of Birth/Sex: Jul 27, 1941 (79 y.o. M) Treating RN: Huel Coventry Primary Care Provider: Windle Guard Other Clinician: Referring Provider: Windle Guard Treating Provider/Extender: Linwood Dibbles, [place] Prout Weeks in Treatment: 1 Verbal / Phone Orders: No Diagnosis Coding ICD-10 Coding Code Description S41.101A Unspecified open wound of right upper  arm, initial encounter S81.802A Unspecified open wound, left lower leg, initial encounter I10 Essential (primary) hypertension Z79.01 Long term (current) use of anticoagulants Z95.0 Presence of cardiac pacemaker I25.10 Atherosclerotic heart disease of native coronary artery without angina pectoris Wound Cleansing Wound #3 Left,Anterior Lower Leg o Cleanse wound with mild soap and water Anesthetic (add to Medication List) Wound #3 Left,Anterior Lower Leg o Topical Lidocaine 4% cream applied to wound bed prior to debridement (In Clinic Only). Primary Wound Dressing Wound #3 Left,Anterior Lower Leg o Silver Alginate Secondary Dressing Wound #3 Left,Anterior Lower Leg o Conform/Kerlix Dressing Change Frequency Wound #3 Left,Anterior Lower Leg o Change Dressing Monday, Wednesday, Friday Follow-up Appointments Wound #3 Left,Anterior Lower Leg o Return Appointment in 2 weeks. Edema Control Wound #3 Left,Anterior Lower Leg o Other: - Tubi-grip F Additional Orders / Instructions Wound #3 Left,Anterior Lower Leg o Activity as tolerated Electronic Signature(s) Signed: 03/04/2020 5:22:45 PM By: Lenda Kelp PA-C Entered By: Lenda Kelp on 03/04/2020 14:25:38 Swopes, [name] ([id number]) -------------------------------------------------------------------------------- Problem List Details Patient Name: Arrants, Danile L. Date of Service: 03/04/2020 10:45 AM Medical Record Number: [id number] Patient Account Number: [id number] Date of Birth/Sex: 1941/03/29 (79 y.o. M) Treating RN: Huel Coventry Primary Care Provider: Windle Guard Other Clinician: Referring Provider: Windle Guard Treating Provider/Extender: Linwood Dibbles, Jelene Albano Weeks in Treatment: 1 Active Problems ICD-10 Encounter Code Description Active Date MDM Diagnosis S41.101A Unspecified open wound of right upper arm, initial encounter 02/26/2020 No Yes S81.802A Unspecified open wound, left lower leg, initial  encounter 02/26/2020 No Yes I10 Essential (primary) hypertension 02/26/2020 No Yes Z79.01 Long term (current) use of anticoagulants 02/26/2020 No Yes Z95.0 Presence of cardiac pacemaker 02/26/2020 No Yes I25.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 ([id number]) -------------------------------------------------------------------------------- Progress Note Details Patient Name: [name], [name] Date of Service: 03/04/2020 10:45 AM Medical Record Number: [id number] Patient Account Number: [id number] Date of Birth/Sex: 05-04-1941 (79 y.o. M) Treating RN: Huel Coventry Primary Care Provider: Windle Guard Other Clinician: Referring Provider: Windle Guard Treating Provider/Extender: Linwood Dibbles, Marleah Beever Weeks in Treatment: 1 Subjective Chief Complaint Information obtained from Patient Right arm and left leg ulcer History of Present Illness (HPI) 03/18/17 on evaluation today patient presents for initial visit concerning an ulcer he has on the left lateral lower extremity which has been present since mid February 2018. Unfortunately due to other family circumstances evaluation and treatment of this wound has been put on hold until this point when they could finally get him into be seen. He does have high blood pressure but has no history of diabetes though he does tell me that his cardiologist had been monitoring him for hyperglycemia and he is "borderline". He does have some discomfort in regard to this wound but fortunately this is not severe and typically with cleansing. His main concern is why this wound is not healing as in the past he has never had any difficulty with healing. The initial injury was during the period of time where he was helping a friend with  some work and scrape the leg on a brick during that process. It has just never healed since that time. Patient is on chronic anticoagulant therapy and has not had any cardiovascular procedures such as arterial or venous imaging. 03/25/2017 - I'm seeing the patient for the first time today and notes that he has had a injury to the left lower extremity since mid February and from what I understand a hematoma opened out into a lacerated wound and this has been slow to heal. He has had no history of previous arterial or venous problems and has had some cardiac history with stents placed. He is not a diabetic and not a smoker. During his previous visit there has been a arterial and venous duplex study ordered and this is pending 04/01/2017 -- arterial and venous duplex studies still pending 04/08/2017 -- lower extremity venous duplex reflux evaluation done on 04/02/2017 showed no evidence of venous incompetence in both left and right great saphenous veins. lower extremity arterial duplex evaluation done on 04/06/2017 -- no evidence of hemodynamically significant arterial occlusive disease bilaterally. the right ABI was 1.19 with a toe pressure of 0.83 and the left ABI was 1.25 with a toe pressures of 0.78 and triphasic flow. these were all normal. 04/15/17 on evaluation today patient appears to be doing fairly well in regard to his right lower extremity wound. He has been tolerating the dressing without complication. His biggest thing is that he states he was "hoping that today would be the last visit and he will not have to come back". Nonetheless he still has an open wound noted and I explained to him that we do want to see him back to obviously work toward getting this to close appropriately. Fortunately we did have the results as noted above of his arterial duplex study and venous studies which appear to be doing very well. Overall I do believe he is progressing nicely. No fevers, chills, nausea, or  vomiting noted at this time. 04/22/17 on evaluation today patient's left lower from the wound appears to be doing about the same as last week's evaluation. He also has a superficial skin tear in the lateral periwound that I believe is due to the current  dressing that is the [facility] Dressing actually sticking to his skin. I think this could be potentially damaging the wound bed as well. With that being said he is not having any significant pain. No fevers, chills, nausea, or vomiting noted at this time. Readmission: 02/26/2020 patient presents today for reevaluation here clinically has not been seen since August 2018. With that being said he tells me that about a week ago he did sustain an injury to his leg during a fall. Fortunately there is no signs of active infection at this time. No fevers, chills, nausea, vomiting, or diarrhea. He tells me that he has "not been putting antibiotic ointment on it because that will not help it heal". Mainly they have just been wrapping this with nothing on it and cleaning with saline in between. Fortunately there is no signs of obvious infection although I am concerned about that possibility with the drainage she is having I think it may be more due to swelling than anything. He does have a wound on the upper arm as well on the right but this appears to be almost completely healed I am not really concerned about this I think just a protective bandage is all we really need here. 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 03/04/2020 upon evaluation today patient actually appears to be doing quite well with regard to his wounds he is tolerating the dressing changes without complication and overall he is going require some sharp debridement today but this is minimal and for the most part his wounds are healed. Overall I think he is  progressing quite nicely. Objective Constitutional Well-nourished and well-hydrated in no acute distress. Vitals Time Taken: 11:07 AM, Temperature: 98.2 F, Pulse: 68 bpm, Respiratory Rate: 16 breaths/min, Blood Pressure: 110/62 mmHg. [name], James ([id number]) Respiratory normal breathing without difficulty. Psychiatric this patient is able to make decisions and demonstrates good insight into disease process. Alert and Oriented x 3. pleasant and cooperative. General Notes: Patient's wound bed currently showed signs of good granulation at this time fortunately there does not appear to be any evidence of infection he did have some slough and drainage buildup on the surface of the wound which did require sharp debridement to clear away today he tolerated that without complication post debridement wound bed appears to be doing much better Integumentary (Hair, Skin) Wound #3 status is Open. Original cause of wound was Trauma. The wound is located on the Left,Anterior Lower Leg. The wound measures 0.5cm length x 0.4cm width x 0.1cm depth; 0.157cm^2 area and 0.016cm^3 volume. Wound #4 status is Healed - Epithelialized. Original cause of wound was Trauma. The wound is located on the [facility] Lower Leg. The wound measures 0cm length x 0cm width x 0cm depth; 0cm^2 area and 0cm^3 volume. Wound #5 status is Healed - Epithelialized. Original cause of wound was Trauma. The wound is located on the Right Upper Arm. The wound measures 0cm length x 0cm width x 0cm depth; 0cm^2 area and 0cm^3 volume. Assessment Active Problems ICD-10 Unspecified open wound of right upper arm, initial encounter Unspecified open wound, left lower leg, initial encounter Essential (primary) hypertension Long term (current) use of anticoagulants Presence of cardiac pacemaker Atherosclerotic heart disease of native coronary artery without angina pectoris Procedures Wound #3 Pre-procedure diagnosis of Wound #3  is a Trauma, Other located on the Left,Anterior Lower Leg . There was a Excisional  Skin/Subcutaneous Tissue Debridement with a total area of 0.2 sq cm performed by James III, Glender Augusta E., PA-C. With the following instrument(s): Curette to remove Viable and Non-Viable tissue/material. Material removed includes Subcutaneous Tissue and Slough and after achieving pain control using Lidocaine 4% Topical Solution. No specimens were taken. A time out was conducted at 11:20, prior to the start of the procedure. A Minimum amount of bleeding was controlled with Pressure. The procedure was tolerated well with a pain level of 2 throughout and a pain level of 0 following the procedure. Post Debridement Measurements: 0.5cm length x 0.4cm width x 0.2cm depth; 0.031cm^3 volume. Character of Wound/Ulcer Post Debridement is improved. Post procedure Diagnosis Wound #3: Same as Pre-Procedure Plan Wound Cleansing: Wound #3 Left,Anterior Lower Leg: Cleanse wound with mild soap and water Anesthetic (add to Medication List): Wound #3 Left,Anterior Lower Leg: Topical Lidocaine 4% cream applied to wound bed prior to debridement (In Clinic Only). Primary Wound Dressing: Wound #3 Left,Anterior Lower Leg: Silver Alginate Secondary Dressing: Wound #3 Left,Anterior Lower Leg: Conform/Kerlix Dressing Change Frequency: [name], James ([id number]) Wound #3 Left,Anterior Lower Leg: Change Dressing Monday, Wednesday, Friday Follow-up Appointments: Wound #3 Left,Anterior Lower Leg: Return Appointment in 2 weeks. Edema Control: Wound #3 Left,Anterior Lower Leg: Other: - Tubi-grip F Additional Orders / Instructions: Wound #3 Left,Anterior Lower Leg: Activity as tolerated 1. I would recommend currently that we go ahead and initiate a continuation of treatment with the silver alginate dressing I think that still the best option he is done very well with that up to this point. 2. I am also can recommend that we continue to  secure this with roll gauze and an ABD pad followed by Tubigrip which has done extremely well for him. We will see patient back for reevaluation in 2 weeks here in the clinic. If anything worsens or changes patient will contact our office for additional recommendations. Electronic Signature(s) Signed: 03/04/2020 2:26:05 PM By: Lenda Kelp PA-C Previous Signature: 03/04/2020 2:24:13 PM Version By: Lenda Kelp PA-C Entered By: Lenda Kelp on 03/04/2020 14:26:04 Hirano, [name] ([id number]) -------------------------------------------------------------------------------- SuperBill Details Patient Name: James Deed L. Date of Service: 03/04/2020 Medical Record Number: [id number] Patient Account Number: [id number] Date of Birth/Sex: 1941-01-10 (79 y.o. 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 Facility Procedures CPT4 Code: [id number] Description: 11042 - DEB SUBQ TISSUE 20 SQ CM/< Modifier: Quantity: 1 CPT4 Code: Description: ICD-10 Diagnosis Description S81.802A Unspecified open wound, left lower leg, initial encounter Modifier: Quantity: Physician Procedures CPT4 Code: [id number] Description: 11042 - WC PHYS SUBQ TISS 20 SQ CM Modifier: Quantity: 1 CPT4 Code: Description: ICD-10 Diagnosis Description S81.802A Unspecified open wound, left lower leg, initial encounter Modifier: Quantity: Electronic Signature(s) Signed: 03/04/2020 2:26:18 PM By:  Lenda Kelp PA-C Entered By: Lenda Kelp on 03/04/2020 14:26:18

## 2020-03-05 NOTE — Progress Notes (Addendum)
JARQUIS, WALKER ([id number]) Visit Report for 03/04/2020 Arrival Information Details Patient Name: [name], PENNA. Date of Service: 03/04/2020 10:45 AM Medical Record Number: [id number] Patient Account Number: [id number] Date of Birth/Sex: 07-01-1941 (79 y.o. M) Treating RN: Huel Coventry Primary Care Vlasta Baskin: Windle Guard Other Clinician: Referring Lejend Dalby: Windle Guard Treating Dallis Czaja/Extender: Linwood Dibbles, HOYT Weeks in Treatment: 1 Visit Information History Since Last Visit Added or deleted any medications: No Patient Arrived: Ambulatory Any new allergies or adverse reactions: No Arrival Time: 11:04 Had a fall or experienced change in No Accompanied By: self activities of daily living that may affect Transfer Assistance: None risk of falls: Patient Has Alerts: Yes Signs or symptoms of abuse/neglect since last visito No Patient Alerts: Patient on Blood Thinner Hospitalized since last visit: No Eliquis Implantable device outside of the clinic excluding No cellular tissue based products placed in the center since last visit: Has Dressing in Place as Prescribed: Yes Pain Present Now: No Electronic Signature(s) Signed: 03/05/2020 12:55:53 PM By: Elliot Gurney, BSN, RN, CWS, Kim RN, BSN Entered By: Elliot Gurney, BSN, RN, CWS, Kim on 03/04/2020 11:07:26 [name] ([id number]) -------------------------------------------------------------------------------- Encounter Discharge Information Details Patient Name: [name], [name] Date of Service: 03/04/2020 10:45 AM Medical Record Number: [id number] Patient Account Number: [id number] Date of Birth/Sex: 06/01/1941 (79 y.o. M) Treating RN: Huel Coventry Primary Care Trystan Eads: Windle Guard Other Clinician: Referring Jamespaul Secrist: Windle Guard Treating Nidia Grogan/Extender: Linwood Dibbles, HOYT Weeks in Treatment: 1 Encounter Discharge Information Items Post Procedure Vitals Discharge Condition: Stable Unable to obtain vitals Reason: Patient  request Ambulatory Status: Ambulatory Discharge Destination: Home Transportation: Private Auto Accompanied By: self Schedule Follow-up Appointment: Yes Clinical Summary of Care: Electronic Signature(s) Signed: 03/04/2020 12:42:54 PM By: Elliot Gurney, BSN, RN, CWS, Kim RN, BSN Entered By: Elliot Gurney, BSN, RN, CWS, Kim on 03/04/2020 12:42:53 Pellicane, Ferdinand [name] ([id number]) -------------------------------------------------------------------------------- Lower Extremity Assessment Details Patient Name: [name], [name] Date of Service: 03/04/2020 10:45 AM Medical Record Number: [id number] Patient Account Number: [id number] Date of Birth/Sex: 1940/10/04 (79 y.o. M) Treating RN: Huel Coventry Primary Care Maylynn Orzechowski: Windle Guard Other Clinician: Referring Nilam Quakenbush: Windle Guard Treating Oluwaseyi Raffel/Extender: STONE III, HOYT Weeks in Treatment: 1 Edema Assessment Assessed: [Left: No] [Right: No] [Left: Edema] [Right: :] Calf Left: Right: Point of Measurement: 36 cm From Medial Instep 35.6 cm cm Ankle Left: Right: Point of Measurement: 13 cm From Medial Instep 22 cm cm Vascular Assessment Pulses: Dorsalis Pedis Palpable: [Left:Yes] Electronic Signature(s) Signed: 03/05/2020 12:55:53 PM By: Elliot Gurney, BSN, RN, CWS, Kim RN, BSN Entered By: Elliot Gurney, BSN, RN, CWS, Kim on 03/04/2020 11:14:55 [name] ([id number]) -------------------------------------------------------------------------------- Multi Wound Chart Details Patient Name: [name], [name] Date of Service: 03/04/2020 10:45 AM Medical Record Number: [id number] Patient Account Number: [id number] Date of Birth/Sex: [birth year] (79 y.o. M) Treating RN: Huel Coventry Primary Care Raychel Dowler: Windle Guard Other Clinician: Referring Oliverio Cho: Windle Guard Treating Timotheus Salm/Extender: Linwood Dibbles, HOYT Weeks in Treatment: 1 Vital Signs Height(in): Pulse(bpm): 68 Weight(lbs): Blood Pressure(mmHg): 110/62 Body Mass Index(BMI): Temperature(F):  98.2 Respiratory Rate(breaths/min): 16 Photos: Wound Location: Left, Anterior Lower Leg Left, Distal, Anterior Lower Leg Right Upper Arm Wounding Event: Trauma Trauma Trauma Primary Etiology: Trauma, Other Skin Tear Skin Tear Date Acquired: 02/18/2020 02/18/2020 02/18/2020 Weeks of Treatment: 1 1 1  Wound Status: Open Healed - Epithelialized Healed - Epithelialized Measurements L x W x D (cm) 0.5x0.4x0.1 0x0x0 0x0x0 Area (cm) : 0.157 0 0 Volume (cm) : 0.016 0 0 % Reduction in Area: 97.50% 100.00% 100.00% % Reduction in Volume: 97.50% 100.00% 100.00%  Classification: Full Thickness Without Exposed Full Thickness Without Exposed Full Thickness Without Exposed Support Structures Support Structures Support Structures Debridement: Debridement - Excisional N/A N/A Pre-procedure Verification/Time 11:20 N/A N/A Out Taken: Pain Control: Lidocaine 4% Topical Solution N/A N/A Tissue Debrided: Subcutaneous, Slough N/A N/A Level: Skin/Subcutaneous Tissue N/A N/A Debridement Area (sq cm): 0.2 N/A N/A Instrument: Curette N/A N/A Bleeding: Minimum N/A N/A Hemostasis Achieved: Pressure N/A N/A Procedural Pain: 2 N/A N/A Post Procedural Pain: 0 N/A N/A Debridement Treatment Procedure was tolerated well N/A N/A Response: Post Debridement 0.5x0.4x0.2 N/A N/A Measurements L x W x D (cm) Post Debridement Volume: 0.031 N/A N/A (cm) Procedures Performed: Debridement N/A N/A Treatment Notes Electronic Signature(s) Signed: 03/05/2020 12:55:53 PM By: Elliot Gurney, BSN, RN, CWS, Kim RN, BSN Entered By: Elliot Gurney, BSN, RN, CWS, Kim on 03/04/2020 11:29:56 [name] ([id number]) [name] ([id number]) -------------------------------------------------------------------------------- Multi-Disciplinary Care Plan Details Patient Name: [name], [name] Date of Service: 03/04/2020 10:45 AM Medical Record Number: [id number] Patient Account Number: [id number] Date of Birth/Sex: 08/22/1941 (79 y.o. M) Treating RN:  Huel Coventry Primary Care Shenaya Lebo: Windle Guard Other Clinician: Referring Zelene Barga: Windle Guard Treating Daisean Brodhead/Extender: Linwood Dibbles, HOYT Weeks in Treatment: 1 Active Inactive Electronic Signature(s) Signed: 03/18/2020 5:29:59 PM By: Elliot Gurney, BSN, RN, CWS, Kim RN, BSN Previous Signature: 03/05/2020 12:55:53 PM Version By: Elliot Gurney, BSN, RN, CWS, Kim RN, BSN Entered By: Elliot Gurney, BSN, RN, CWS, Kim on 03/18/2020 17:29:58 Difatta, Ferdinand [name] ([id number]) -------------------------------------------------------------------------------- Pain Assessment Details Patient Name: [name], [name] Date of Service: 03/04/2020 10:45 AM Medical Record Number: [id number] Patient Account Number: [id number] Date of Birth/Sex: 01/19/1941 (79 y.o. M) Treating RN: Huel Coventry Primary Care Lyndon Chapel: Windle Guard Other Clinician: Referring Kherington Meraz: Windle Guard Treating Pellegrino Kennard/Extender: Linwood Dibbles, HOYT Weeks in Treatment: 1 Active Problems Location of Pain Severity and Description of Pain Patient Has Paino No Site Locations Pain Management and Medication Current Pain Management: Notes Patient denies pain at this time Electronic Signature(s) Signed: 03/05/2020 12:55:53 PM By: Elliot Gurney, BSN, RN, CWS, Kim RN, BSN Entered By: Elliot Gurney, BSN, RN, CWS, Kim on 03/04/2020 11:10:43 Doland, Ferdinand [name] ([id number]) -------------------------------------------------------------------------------- Patient/Caregiver Education Details Patient Name: James Deed L. Date of Service: 03/04/2020 10:45 AM Medical Record Number: [id number] Patient Account Number: [id number] Date of Birth/Gender: Apr 12, 1941 (79 y.o. M) Treating RN: Huel Coventry Primary Care Physician: Windle Guard Other Clinician: Referring Physician: Windle Guard Treating Physician/Extender: Linwood Dibbles, HOYT Weeks in Treatment: 1 Education Assessment Education Provided To: Patient Education Topics Provided Wound/Skin Impairment: Handouts: Caring for Your  Ulcer, Other: continue wound care as prescribed. Methods: Demonstration, Explain/Verbal Responses: State content correctly Electronic Signature(s) Signed: 03/05/2020 12:55:53 PM By: Elliot Gurney, BSN, RN, CWS, Kim RN, BSN Entered By: Elliot Gurney, BSN, RN, CWS, Kim on 03/04/2020 11:35:38 Armor, Ferdinand [name] ([id number]) -------------------------------------------------------------------------------- Wound Assessment Details Patient Name: [name], [name] Date of Service: 03/04/2020 10:45 AM Medical Record Number: [id number] Patient Account Number: [id number] Date of Birth/Sex: 01-26-1941 (79 y.o. M) Treating RN: Huel Coventry Primary Care Helen Cuff: Windle Guard Other Clinician: Referring Sevin Farone: Windle Guard Treating Tyrisha Benninger/Extender: Linwood Dibbles, HOYT Weeks in Treatment: 1 Wound Status Wound Number: 3 Primary Etiology: Trauma, Other Wound Location: Left, Anterior Lower Leg Wound Status: Open Wounding Event: Trauma Date Acquired: 02/18/2020 Weeks Of Treatment: 1 Clustered Wound: No Photos Photo Uploaded By: Elliot Gurney, BSN, RN, CWS, Kim on 03/04/2020 11:15:54 Wound Measurements Length: (cm) 0.5 Width: (cm) 0.4 Depth: (cm) 0.1 Area: (cm) 0.157 Volume: (cm) 0.016 % Reduction in Area: 97.5% % Reduction in Volume: 97.5% Wound Description Classification:  Full Thickness Without Exposed Support Land) Signed: 03/05/2020 12:55:53 PM By: Elliot Gurney, BSN, RN, CWS, Kim RN, BSN Entered By: Elliot Gurney, BSN, RN, CWS, Kim on 03/04/2020 11:13:53 Jackowski, Ferdinand [name] ([id number]) -------------------------------------------------------------------------------- Wound Assessment Details Patient Name: [name], [name] Date of Service: 03/04/2020 10:45 AM Medical Record Number: [id number] Patient Account Number: [id number] Date of Birth/Sex: 19-Apr-1941 (79 y.o. M) Treating RN: Huel Coventry Primary Care Damariz Paganelli: Windle Guard Other Clinician: Referring Tai Skelly: Windle Guard Treating  Lowen Mansouri/Extender: Linwood Dibbles, HOYT Weeks in Treatment: 1 Wound Status Wound Number: 4 Primary Etiology: Skin Tear Wound Location: Left, Distal, Anterior Lower Leg Wound Status: Healed - Epithelialized Wounding Event: Trauma Date Acquired: 02/18/2020 Weeks Of Treatment: 1 Clustered Wound: No Photos Photo Uploaded By: Elliot Gurney, BSN, RN, CWS, Kim on 03/04/2020 11:15:54 Wound Measurements Length: (cm) Width: (cm) Depth: (cm) Area: (cm) Volume: (cm) 0 % Reduction in Area: 100% 0 % Reduction in Volume: 100% 0 0 0 Wound Description Classification: Full Thickness Without Exposed Support Structu res Electronic Signature(s) Signed: 03/05/2020 12:55:53 PM By: Elliot Gurney, BSN, RN, CWS, Kim RN, BSN Entered By: Elliot Gurney, BSN, RN, CWS, Kim on 03/04/2020 11:13:54 Schram, Ferdinand [name] ([id number]) -------------------------------------------------------------------------------- Wound Assessment Details Patient Name: [name], [name] Date of Service: 03/04/2020 10:45 AM Medical Record Number: [id number] Patient Account Number: [id number] Date of Birth/Sex: 1941-01-22 (79 y.o. M) Treating RN: Huel Coventry Primary Care Jolan Mealor: Windle Guard Other Clinician: Referring Crystallee Werden: Windle Guard Treating Ashtyn Meland/Extender: Linwood Dibbles, HOYT Weeks in Treatment: 1 Wound Status Wound Number: 5 Primary Etiology: Skin Tear Wound Location: Right Upper Arm Wound Status: Healed - Epithelialized Wounding Event: Trauma Date Acquired: 02/18/2020 Weeks Of Treatment: 1 Clustered Wound: No Photos Photo Uploaded By: Elliot Gurney, BSN, RN, CWS, Kim on 03/04/2020 11:15:54 Wound Measurements Length: (cm) Width: (cm) Depth: (cm) Area: (cm) Volume: (cm) 0 % Reduction in Area: 100% 0 % Reduction in Volume: 100% 0 0 0 Wound Description Classification: Full Thickness Without Exposed Support Structu res Electronic Signature(s) Signed: 03/05/2020 12:55:53 PM By: Elliot Gurney, BSN, RN, CWS, Kim RN, BSN Entered By: Elliot Gurney, BSN, RN,  CWS, Kim on 03/04/2020 11:13:54 Burdette, Ferdinand [name] ([id number]) -------------------------------------------------------------------------------- Vitals Details Patient Name: [name], [name] Date of Service: 03/04/2020 10:45 AM Medical Record Number: [id number] Patient Account Number: [id number] Date of Birth/Sex: 01-25-1941 (79 y.o. M) Treating RN: Huel Coventry Primary Care Daiki Dicostanzo: Windle Guard Other Clinician: Referring Batul Diego: Windle Guard Treating Karielle Davidow/Extender: Linwood Dibbles, HOYT Weeks in Treatment: 1 Vital Signs Time Taken: 11:07 Temperature (F): 98.2 Pulse (bpm): 68 Respiratory Rate (breaths/min): 16 Blood Pressure (mmHg): 110/62 Reference Range: 80 - 120 mg / dl Electronic Signature(s) Signed: 03/05/2020 12:55:53 PM By: Elliot Gurney, BSN, RN, CWS, Kim RN, BSN Entered By: Elliot Gurney, BSN, RN, CWS, Kim on 03/04/2020 11:08:02

## 2020-03-12 ENCOUNTER — Ambulatory Visit (INDEPENDENT_AMBULATORY_CARE_PROVIDER_SITE_OTHER): Payer: Medicare PPO | Admitting: *Deleted

## 2020-03-12 DIAGNOSIS — I469 Cardiac arrest, cause unspecified: Secondary | ICD-10-CM

## 2020-03-12 LAB — CUP PACEART REMOTE DEVICE CHECK
Battery Remaining Longevity: 14 mo
Battery Voltage: 2.89 V
Brady Statistic AP VP Percent: 99.26 %
Brady Statistic AP VS Percent: 0.05 %
Brady Statistic AS VP Percent: 0.69 %
Brady Statistic AS VS Percent: 0 %
Brady Statistic RA Percent Paced: 99.3 %
Brady Statistic RV Percent Paced: 99.94 %
Date Time Interrogation Session: 20210706074212
HighPow Impedance: 48 Ohm
HighPow Impedance: 61 Ohm
Implantable Lead Implant Date: 20040315
Implantable Lead Implant Date: 20100611
Implantable Lead Implant Date: 20100611
Implantable Lead Location: 753858
Implantable Lead Location: 753859
Implantable Lead Location: 753860
Implantable Lead Model: 4196
Implantable Lead Model: 5076
Implantable Lead Model: 6947
Implantable Pulse Generator Implant Date: 20160302
Lead Channel Impedance Value: 399 Ohm
Lead Channel Impedance Value: 399 Ohm
Lead Channel Impedance Value: 418 Ohm
Lead Channel Impedance Value: 475 Ohm
Lead Channel Impedance Value: 513 Ohm
Lead Channel Impedance Value: 779 Ohm
Lead Channel Pacing Threshold Amplitude: 0.625 V
Lead Channel Pacing Threshold Amplitude: 0.75 V
Lead Channel Pacing Threshold Pulse Width: 0.4 ms
Lead Channel Pacing Threshold Pulse Width: 0.4 ms
Lead Channel Sensing Intrinsic Amplitude: 1.5 mV
Lead Channel Sensing Intrinsic Amplitude: 1.5 mV
Lead Channel Sensing Intrinsic Amplitude: 11.625 mV
Lead Channel Sensing Intrinsic Amplitude: 14.25 mV
Lead Channel Setting Pacing Amplitude: 1.5 V
Lead Channel Setting Pacing Amplitude: 1.5 V
Lead Channel Setting Pacing Amplitude: 3 V
Lead Channel Setting Pacing Pulse Width: 0.4 ms
Lead Channel Setting Pacing Pulse Width: 1 ms
Lead Channel Setting Sensing Sensitivity: 0.3 mV

## 2020-03-13 ENCOUNTER — Ambulatory Visit (INDEPENDENT_AMBULATORY_CARE_PROVIDER_SITE_OTHER): Payer: Medicare PPO

## 2020-03-13 DIAGNOSIS — I5022 Chronic systolic (congestive) heart failure: Secondary | ICD-10-CM

## 2020-03-13 DIAGNOSIS — Z95810 Presence of automatic (implantable) cardiac defibrillator: Secondary | ICD-10-CM

## 2020-03-13 NOTE — Progress Notes (Signed)
Remote ICD transmission.   

## 2020-03-13 NOTE — Progress Notes (Signed)
EPIC Encounter for ICM Monitoring  Patient Name: [name] is a 79 y.o. male Date: 03/13/2020 Primary Care Physican: Kaleen Mask, MD Primary Cardiologist:Nahser Electrophysiologist:Klein Bi-V Pacing:99.9% 02/06/2020 Office Weight: 222 lbs   Spoke with daughter [name].  She reports patient is feeling a constant thumping from his device.  She said it is very uncomfortable for him and keeping him awake at night.  Advised for him to discuss with Renee at the 7/9 OV and maybe the device can be reprogrammed to help.  She said patient is very hard of hearing and unsure if he will be able to understand what is being said during the visit.  She is unable to come to the office with him.  Message sent to [facility] regarding daughter's concerns.  Optivol thoracic impedancenormal   Prescribed:  Furosemide 20 mg take 1 tablet every Mon, Wed and Friday   Potassium 10 mEq take 1 tablet every Mon, Wed and Friday   Labs: 12/25/2019 Creatinine 0.97, BUN 16, Potassium 4.5, Sodium 143, GFR 73-85 11/01/2019 Creatinine 1.18, BUN 26, Potassium 4.9, Sodium 139, GFR 58-67  A complete set of results can be found in Results Review.  Recommendations: No changes and encouraged to call if experiencing any fluid symptoms.   EP/Cardiology Office Visits: 03/15/2020 with Francis Dowse, PA.    Copy of ICM check sent to Dr. Graciela Husbands.   3 month ICM trend: 03/12/2020    1 Year ICM trend:       Karie Soda, RN 03/13/2020 4:00 PM

## 2020-03-14 NOTE — Progress Notes (Signed)
Cardiology Office Note Date:  03/14/2020  Patient ID:  [name], DOB 1941-06-28, MRN [id number] PCP:  Kaleen MaskElkins, Wilson Oliver, MD  Cardiologist:  Dr. Elease HashimotoNahser EP: Dr. Graciela HusbandsKlein    Chief Complaint: diaphragmatic stim  History of Present Illness: [name] is a 79 y.o. male with history of CAD, chronic CHF (systolic), ICD, BPH,  HLD, gout, AFib.  He comes in today to be seen for Dr. Graciela HusbandsKlein.  Last seen by him Oct 2019.  He was doing well, no changes were made.  He saw Dr. Elease HashimotoNahser 12/13/19. He had a fall recently was sore from this.  ICM clinic noted marked volume OL via his device and started on diuretic tx.  Noting lower BP since. His device was checked this visit noting his LV lead thresholds had risen and outputs were subthreshold.  Reprogrammed LV lead outputs.  EKG noted marked improvement in his QRS duration. He was planned for stress testing to evaluate some indigestion like CP,  His stress test had scar no evidence of ischemia, LVEF 23%   I saw him 12/25/2019 He is feeling well.  Very concerned about the wide reanding BP's at his last couple checks.  Normally at home his BP is 120's-130 range, and felt very weak and unsteady when it got so low before.  Worries now that his BP was so high today.  Physically though he feels like he is doing well.  He denies rest SOB, no symptoms of PND or orthopnea, says he sleeps well.  He denies any CP or SOB/DOE with his ADLs, does his grocery shopping without difficulty. No dizzy spells, near syncope or syncope. No palpitations Reports compliance with his medicines Mentions his BP got too low with the furosemide as well as the Entresto, both made him feel pretty awful  His device function was good, LV threshold was down significantly though give findings at Dr. Harvie BridgeNahser's visit outputs not decreased He reports occassional diaphragmatic stim, this goes back years he states, and has discussed with Dr. Graciela HusbandsKlein.  It is pretty positional particular, when  seated and leaning to the right. It is reproduced today at [facility] on the LV lead, none at lower voltages. Given this has been previously discussed/addressed, no changes made, he report is not persistent or insenescent.   02/06/2020 saw Wende MottS. Weaver, PA, SOB/edema stable, his furosemide resumed M-W-F with plans to adjust as BP allowed and OptiVol, volume status indicated.  iF BP remained good, thoughts toward low dose ACE or ARB at his next cardiology visit.  03/13/20 ICM visit  Wife reported marked uptick in diaphragmatic stim, keeping him up at night His OptiVol was normal His daughter mentioned to [name] that the patient had been terrible and constant thumping from his device, very uncomfortable and unable to sleep  Outside of the thumping he is doing ok.  Though is tired and has not been sleeping well because of it.  He reports this has been a problem though not as persistently.   He has not been taking the lasix as often as instructed because of frequent urination.  He denies any CP, or SOB. No dizzyy spells near syncope or syncope.   He has some old ecchymosis on his left flank, no swelling, tenderness, or hematoma, he reports this is healing from a fall he had in march.  He asks if the fall may have done something to cause the increase in the thumping.   He denies bleeding or signs ofbleeding, no more falls  Device information MDT CRT-D, gen change 11/07/2014 RA lead 2010 RV lead 2004 LV lead 2011   Past Medical History:  Diagnosis Date   Arthritis    knees, back    BPH (benign prostatic hypertrophy)    Chronic kidney disease    BPH   Chronic systolic heart failure (HCC)    a. s/p MDT single chamber ICD 2004 as part of MASTER study b. upgrade to CRTD 2010; CRTD gen change 2016   Coronary artery disease    a. s/p anterior MI and CYPHER stent to LAD 2005   Dyslipidemia    Gout    Ischemic cardiomyopathy    Paroxysmal atrial fibrillation (HCC)    Ventricular tachycardia (HCC)      Past Surgical History:  Procedure Laterality Date   APPENDECTOMY     BI-VENTRICULAR IMPLANTABLE CARDIOVERTER DEFIBRILLATOR UPGRADE N/A 11/07/2014   a. MDT single chamber ICD implanted 2004 as part of MASTER study; upgrade to CRTD 2010; gen change 2016   CARDIAC CATHETERIZATION  2005   CARDIOVERSION N/A 07/04/2019   Procedure: CARDIOVERSION;  Surgeon: Wendall Stade, MD;  Location: [facility] ENDOSCOPY;  Service: Cardiovascular;  Laterality: N/A;   CARDIOVERSION N/A 08/17/2019   Procedure: CARDIOVERSION;  Surgeon: Vesta Mixer, MD;  Location[facility] ENDOSCOPY;  Service: Cardiovascular;  Laterality: N/A;   CORONARY ANGIOPLASTY     5 stents    KNEE ARTHROSCOPY     OTHER SURGICAL HISTORY     right ear surgery as a child    PROSTATECTOMY  11/06/2011   Procedure: PROSTATECTOMY SUPRAPUBIC;  Surgeon: Kathi Ludwig, MD;  Location: [facility] ORS;  Service: Urology;  Laterality: N/A;  Open Suprapubic Prostatectomy   TEE WITHOUT CARDIOVERSION N/A 05/16/2019   Procedure: TRANSESOPHAGEAL ECHOCARDIOGRAM (TEE);  Surgeon: Sande Rives, MD;  Location: [facility] ENDOSCOPY;  Service: Cardiology;  Laterality: N/A;    Current Outpatient Medications  Medication Sig Dispense Refill   allopurinol (ZYLOPRIM) 100 MG tablet Take 100 mg by mouth daily.     amiodarone (PACERONE) 200 MG tablet Take 1 tablet (200 mg total) by mouth daily. 90 tablet 2   carvedilol (COREG) 3.125 MG tablet Take 1 tablet (3.125 mg total) by mouth 2 (two) times daily. 180 tablet 3   ELIQUIS 5 MG TABS tablet TAKE 1 TABLET BY MOUTH  TWICE DAILY 180 tablet 3   furosemide (LASIX) 20 MG tablet Take 1 tablet (20 mg total) by mouth every Monday, Wednesday, and Friday. 90 tablet 3   latanoprost (XALATAN) 0.005 % ophthalmic solution Place 1 drop into both eyes at bedtime.      naproxen sodium (ALEVE) 220 MG tablet Take 220 mg by mouth daily as needed (pain).     nitroGLYCERIN (NITROSTAT) 0.4 MG SL tablet Place 1 tablet (0.4 mg total)  under the tongue every 5 (five) minutes as needed for chest pain. 30 tablet 3   Polyethyl Glycol-Propyl Glycol 0.4-0.3 % SOLN Place 1 drop into both eyes 2 (two) times daily.      potassium chloride (KLOR-CON) 10 MEQ tablet Take 1 tablet (10 mEq total) by mouth every Monday, Wednesday, and Friday. 90 tablet 3   rosuvastatin (CRESTOR) 10 MG tablet Take 5 mg by mouth daily.     TURMERIC PO Take 538 mg by mouth daily.      No current facility-administered medications for this visit.    Allergies:   Atorvastatin   Social History:  The patient  reports that he has never smoked. He has never  used smokeless tobacco. He reports that he does not drink alcohol and does not use drugs.   Family History:  The patient's family history includes Heart disease in his brother, brother, sister, and sister.  ROS:  Please see the history of present illness.  All other systems are reviewed and otherwise negative.   PHYSICAL EXAM:  VS:  There were no vitals taken for this visit. BMI: There is no height or weight on file to calculate BMI. Well nourished, well developed, in no acute distress  HEENT: normocephalic, atraumatic  Neck: no JVD, carotid bruits or masses Cardiac:  RRR; no significant murmurs, no rubs, or gallops Lungs:  CTA b/l no wheezing, rhonchi or rales  Abd: soft, nontender MS: no deformity or atrophy Ext: he has trace edema to just above the ankles, chronic looking skin changes b/l LE  Skin: warm and dry, no rash Neuro:  No gross deficits appreciated Psych: euthymic mood, full affect  ICD site is stable, no tethering or discomfort   EKG:  Not done today  ICD interrogation done today and reviewed by myself:  Battery estimate is 13 months No A or V arrhythmias Lead measurements are stable. He has visible diaphragmatic stim walking in. This is the LV lead. There is a note in his device that he has stim poorly tolerated pacing using ring infact today when pacing with the ring he  started to have belching, and almost immediately said he felt poorly, Unfortunately LVring to coil had the best threshold and no diaphragmatic stim The best configuration remains LV tip to RV coil.  Threshold today is 1.5V/1.oms The LV lead was programmed 1.75V/1.26ms Here he had some intermittent stimulation and very mild.    11/23/2019: TTE IMPRESSIONS  1. Left ventricular ejection fraction, by estimation, is 30 to 35%. The  left ventricle has moderately decreased function. The left ventricle  demonstrates regional wall motion abnormalities (see scoring  diagram/findings for description). The left  ventricular internal cavity size was mildly dilated. Left ventricular  diastolic parameters are consistent with Grade II diastolic dysfunction  (pseudonormalization). Elevated left ventricular end-diastolic pressure.  2. Right ventricular systolic function is normal. The right ventricular  size is normal.  3. The mitral valve is normal in structure. Mild mitral valve  regurgitation. No evidence of mitral stenosis.  4. The aortic valve is tricuspid. Aortic valve regurgitation is mild. No  aortic stenosis is present.  5. Aortic dilatation noted. There is mild dilatation of the ascending  aorta.  6. The inferior vena cava is normal in size with greater than 50%  respiratory variability, suggesting right atrial pressure of 3 mmHg.    12/20/2019: Stress myoview  The left ventricular ejection fraction is severely decreased (<30%).  Nuclear stress EF: 23%.  There was no ST segment deviation noted during stress.  There is a large defect of severe severity present in the basal anteroseptal, basal inferoseptal, mid anterior, mid anteroseptal, mid inferoseptal, apical anterior, apical septal and apex location. This consistent with prior infarct/scar. No ischemia.  Findings consistent with prior myocardial infarction.  This is a high risk study.   Recent Labs: 12/25/2019: Hemoglobin 14.3;  Platelets 224 12/29/2019: ALT 14 02/06/2020: TSH 5.240 02/20/2020: BUN 18; Creatinine, Ser 1.27; Potassium 4.5; Sodium 137  11/01/2019: Chol/HDL Ratio 2.8; Cholesterol, Total 115; HDL 41; LDL Chol Calc (NIH) 59; Triglycerides 74   CrCl cannot be calculated (Patient's most recent lab result is older than the maximum 21 days allowed.).   Wt Readings from  Last 3 Encounters:  02/06/20 222 lb (100.7 kg)  12/25/19 227 lb (103 kg)  12/20/19 222 lb (100.7 kg)     Other studies reviewed: Additional studies/records reviewed today include: summarized above  ASSESSMENT AND PLAN:  1. CRT-D      Intact function, 99.9% BV pacing      He is pacer dependent      diaphragmatic stim was impossible to resolve completely.      When he saw Dr. Elease Hashimoto in April with loss of LV capture he had developed volume OL   I left him LV tip to RV coil 1.75V/1.23ms.  We ambulated in the hall, had him recline on the exam table and seated in a chair.  He has mild intermittent stimulation though he reported was much better and very tolerable. I have asked he call should it worsen again Will have him get a CXR to see if in fact LV lead may have moved after his fall in March. EKG done today with QRS with small R wave in V1, neg I, remains improved from April EKG  Will have him back in a few weeks to follow up   2.  ICM 3. Chronic CHF      Trace edema, Optivol remains well below threshold     He has cut back the lasix with too frequent urination.       His creat had bumped a little on his last labs with the addition of the furosemide, will check today     He sees Dr. Elease Hashimoto in Sept  4. CAD     No anginal symptoms     C/w Dr. Elease Hashimoto   5. Persistent Afib     CHA2DS2Vasc is 4, on Eliquis      Holding SR on amiodarone      Labs up to date    Disposition: as above  Current medicines are reviewed at length with the patient today.  The patient did not have any concerns regarding  medicines.  Norma Fredrickson, PA-C 03/14/2020 7:29 PM     [facility] [address] [place] [address] [phone] (office)  [phone] (fax)

## 2020-03-15 ENCOUNTER — Other Ambulatory Visit: Payer: Self-pay

## 2020-03-15 ENCOUNTER — Ambulatory Visit: Payer: Medicare PPO | Admitting: Physician Assistant

## 2020-03-15 VITALS — BP 110/62 | HR 77 | Ht 71.0 in | Wt 221.0 lb

## 2020-03-15 DIAGNOSIS — I4819 Other persistent atrial fibrillation: Secondary | ICD-10-CM

## 2020-03-15 DIAGNOSIS — Z95810 Presence of automatic (implantable) cardiac defibrillator: Secondary | ICD-10-CM | POA: Diagnosis not present

## 2020-03-15 DIAGNOSIS — I255 Ischemic cardiomyopathy: Secondary | ICD-10-CM | POA: Diagnosis not present

## 2020-03-15 DIAGNOSIS — I5022 Chronic systolic (congestive) heart failure: Secondary | ICD-10-CM | POA: Diagnosis not present

## 2020-03-15 DIAGNOSIS — I2510 Atherosclerotic heart disease of native coronary artery without angina pectoris: Secondary | ICD-10-CM

## 2020-03-15 NOTE — Patient Instructions (Signed)
Medication Instructions:  Your physician recommends that you continue on your current medications as directed. Please refer to the Current Medication list given to you today.  *If you need a refill on your cardiac medications before your next appointment, please call your pharmacy*   Lab Work: NONE ORDERED  TODAY   If you have labs (blood work) drawn today and your tests are completely normal, you will receive your results only by: [name] MyChart Message (if you have MyChart) OR . A paper copy in the mail If you have any lab test that is abnormal or we need to change your treatment, we will call you to review the results.   Testing/Procedures:  A chest x-ray takes a picture of the organs and structures inside the chest, including the heart, lungs, and blood vessels. This test can show several things, including, whether the heart is enlarges; whether fluid is building up in the lungs; and whether pacemaker / defibrillator leads are still in place.    [facility]  [facility]. [facility] is a premier diagnostic imaging center that offers a 64-slice CT scanner and interventional radiology services. Hours of Operation Ultrasound and Diagnostic: 8:00am-5:00pm, Monday - Friday CT scan: 8:00am-5:30pm, Monday-Friday Directions [address], [address] [place], [place] [address]   Follow-Up: At [facility], you and your health needs are our priority.  As part of our continuing mission to provide you with exceptional heart care, we have created designated Provider Care Teams.  These Care Teams include your primary Cardiologist (physician) and Advanced Practice Providers (APPs -  Physician Assistants and Nurse Practitioners) who all work together to provide you with the care you need, when you need it.  We recommend signing up for the patient portal called "MyChart".  Sign up information is provided on this After  Visit Summary.  MyChart is used to connect with patients for Virtual Visits (Telemedicine).  Patients are able to view lab/test results, encounter notes, upcoming appointments, etc.  Non-urgent messages can be sent to your provider as well.   To learn more about what you can do with MyChart, go to [email or web address].    Your next appointment:  As scheduled   Other Instructions

## 2020-03-16 LAB — BASIC METABOLIC PANEL
BUN/Creatinine Ratio: 15 (ref 10–24)
BUN: 19 mg/dL (ref 8–27)
CO2: 26 mmol/L (ref 20–29)
Calcium: 9.3 mg/dL (ref 8.6–10.2)
Chloride: 102 mmol/L (ref 96–106)
Creatinine, Ser: 1.26 mg/dL (ref 0.76–1.27)
GFR calc Af Amer: 62 mL/min/{1.73_m2} (ref >59)
GFR calc non Af Amer: 54 mL/min/{1.73_m2} — ABNORMAL LOW (ref >59)
Glucose: 82 mg/dL (ref 65–99)
Potassium: 4.9 mmol/L (ref 3.5–5.2)
Sodium: 140 mmol/L (ref 134–144)

## 2020-03-20 ENCOUNTER — Ambulatory Visit
Admission: RE | Admit: 2020-03-20 | Discharge: 2020-03-20 | Disposition: A | Discharge status: Home / Self Care | Payer: Medicare PPO | Source: Ambulatory Visit | Attending: Physician Assistant | Admitting: Physician Assistant

## 2020-03-20 ENCOUNTER — Other Ambulatory Visit: Payer: Self-pay

## 2020-03-20 DIAGNOSIS — I5022 Chronic systolic (congestive) heart failure: Secondary | ICD-10-CM

## 2020-03-21 ENCOUNTER — Ambulatory Visit: Payer: Medicare PPO | Admitting: Internal Medicine

## 2020-03-21 ENCOUNTER — Telehealth: Payer: Self-pay | Admitting: *Deleted

## 2020-03-21 NOTE — Telephone Encounter (Signed)
Spoke with patient about results and verbalized understanding 

## 2020-04-12 ENCOUNTER — Encounter: Payer: Medicare PPO | Admitting: Student

## 2020-04-15 ENCOUNTER — Ambulatory Visit (INDEPENDENT_AMBULATORY_CARE_PROVIDER_SITE_OTHER): Payer: Medicare PPO

## 2020-04-15 DIAGNOSIS — I5022 Chronic systolic (congestive) heart failure: Secondary | ICD-10-CM

## 2020-04-15 DIAGNOSIS — Z95810 Presence of automatic (implantable) cardiac defibrillator: Secondary | ICD-10-CM | POA: Diagnosis not present

## 2020-04-16 NOTE — Progress Notes (Signed)
EPIC Encounter for ICM Monitoring  Patient Name: [name] is a 79 y.o. male Date: 04/16/2020 Primary Care Physican: Kaleen Mask, MD Primary Cardiologist:Nahser Electrophysiologist:Klein Bi-V Pacing:100% 02/06/2020 OfficeWeight: 222 lbs   Spoke with daughter [name].  She said patient does have some days he does not feel well but unsure if it correlates with decreased impedance in July.   Pt saw James Dowse, PA for device causing a thumping feeling and that has improved so far.  Optivol thoracic impedancenormal  Prescribed:  Furosemide 20 mg take 1 tablet every Mon, Wed and Friday   Potassium 10 mEq take 1 tablet every Mon, Wed and Friday  Labs: 03/15/2020 Creatinine 1.26, BUN 19, Potassium 4.9, Sodium 140, GFR 54-62 02/20/2020 Creatinine 1.27, BUN 18, Potassium 4.5, Sodium 137, GFR 53-61 12/25/2019 Creatinine0.97, BUN16, Potassium4.5, Sodium143, FHQ19-75 11/01/2019 Creatinine1.18, BUN26, Potassium4.9, Sodium139, OIT25-49 A complete set of results can be found in Results Review.  Recommendations: No changes and encouraged to call if experiencing any fluid symptoms.  Follow-up plan: ICM clinic phone appointment on 05/20/2020. 91 day device clinic remote transmission 06/10/2020.   EP/Cardiology Office Visits: 05/09/2020 with Dr Melburn Popper.    Copy of ICM check sent to Dr. Graciela Husbands.  3 month ICM trend: 04/15/2020    1 Year ICM trend:       James Soda, RN 04/16/2020 3:21 PM

## 2020-04-22 ENCOUNTER — Telehealth: Payer: Self-pay | Admitting: Cardiovascular Disease

## 2020-04-22 NOTE — Telephone Encounter (Signed)
Patient is requesting to speak with Dr. Harvie Bridge nurse regarding medication. Patient states he will further discuss when he speak with the nurse.

## 2020-04-22 NOTE — Telephone Encounter (Signed)
Patient calling to let us know that he could not tolerate full dose of amiodarone. He states that he was having trouble sleeping and was having trouble with his balance. He has been taking 1/2 tablet (100 mg) daily and states that he is feeling much better. States that blood pressure has been good but did not have readings.

## 2020-04-22 NOTE — Telephone Encounter (Signed)
Agree with reducing his dose of amio to 100 mg a day

## 2020-04-22 NOTE — Telephone Encounter (Signed)
Advised patient to continue taking 1/2 tablet of amiodarone.

## 2020-05-08 ENCOUNTER — Encounter: Payer: Self-pay | Admitting: Cardiovascular Disease

## 2020-05-08 ENCOUNTER — Other Ambulatory Visit: Payer: Self-pay | Admitting: Cardiovascular Disease
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
## 2020-05-09 ENCOUNTER — Encounter: Payer: Self-pay | Admitting: Cardiovascular Disease

## 2020-05-09 ENCOUNTER — Other Ambulatory Visit: Payer: Self-pay

## 2020-05-09 ENCOUNTER — Ambulatory Visit (INDEPENDENT_AMBULATORY_CARE_PROVIDER_SITE_OTHER): Payer: Medicare PPO | Admitting: Cardiovascular Disease

## 2020-05-09 ENCOUNTER — Other Ambulatory Visit: Payer: Medicare PPO

## 2020-05-09 VITALS — BP 118/68 | HR 72 | Ht 71.0 in | Wt 219.8 lb

## 2020-05-09 DIAGNOSIS — I5022 Chronic systolic (congestive) heart failure: Secondary | ICD-10-CM

## 2020-05-09 DIAGNOSIS — Z79899 Other long term (current) drug therapy: Secondary | ICD-10-CM

## 2020-05-09 LAB — BASIC METABOLIC PANEL
BUN/Creatinine Ratio: 16 (ref 10–24)
BUN: 17 mg/dL (ref 8–27)
CO2: 23 mmol/L (ref 20–29)
Calcium: 8.8 mg/dL (ref 8.6–10.2)
Chloride: 104 mmol/L (ref 96–106)
Creatinine, Ser: 1.08 mg/dL (ref 0.76–1.27)
GFR calc Af Amer: 75 mL/min/{1.73_m2} (ref >59)
GFR calc non Af Amer: 64 mL/min/{1.73_m2} (ref >59)
Glucose: 107 mg/dL — ABNORMAL HIGH (ref 65–99)
Potassium: 4.4 mmol/L (ref 3.5–5.2)
Sodium: 139 mmol/L (ref 134–144)

## 2020-05-09 NOTE — Patient Instructions (Signed)
Medication Instructions:  No changes *If you need a refill on your cardiac medications before your next appointment, please call your pharmacy*   Lab Work: Today: TSH and BMET  If you have labs (blood work) drawn today and your tests are completely normal, you will receive your results only by: [name] MyChart Message (if you have MyChart) OR . A paper copy in the mail If you have any lab test that is abnormal or we need to change your treatment, we will call you to review the results.   Testing/Procedures: none   Follow-Up: At [facility], you and your health needs are our priority.  As part of our continuing mission to provide you with exceptional heart care, we have created designated Provider Care Teams.  These Care Teams include your primary Cardiologist (physician) and Advanced Practice Providers (APPs -  Physician Assistants and Nurse Practitioners) who all work together to provide you with the care you need, when you need it.  Your next appointment:   3 month(s)  The format for your next appointment:   In Person  Provider:   You may see one of the following Advanced Practice Providers on your designated Care Team:    Tereso Newcomer, PA-C  Chelsea Aus, [place]   Other Instructions

## 2020-05-15 LAB — TSH: TSH: 6.67 u[IU]/mL — ABNORMAL HIGH (ref 0.450–4.500)

## 2020-05-16 ENCOUNTER — Other Ambulatory Visit: Payer: Self-pay | Admitting: *Deleted

## 2020-05-16 DIAGNOSIS — Z79899 Other long term (current) drug therapy: Secondary | ICD-10-CM

## 2020-05-17 ENCOUNTER — Telehealth: Payer: Self-pay | Admitting: Cardiovascular Disease

## 2020-05-17 NOTE — Telephone Encounter (Signed)
Called pt to inform him that he needed to call his PCP to get a refill on his medication allopurinol and if he has any other heart related problems, questions or concerns, to give our office a call back. Pt verbalized understanding.

## 2020-05-17 NOTE — Telephone Encounter (Signed)
*  STAT* If patient is at the pharmacy, call can be transferred to refill team.   1. Which medications need to be refilled? (please list name of each medication and dose if known) allopurinol (ZYLOPRIM) 100 MG tablet  2. Which pharmacy/location (including street and city if local pharmacy) is medication to be sent to? [facility] - [place], [place] - [address]  3. Do they need a 30 day or 90 day supply? 90 day

## 2020-05-17 NOTE — Telephone Encounter (Signed)
Patient states he is returning a call from [facility] for lab results.

## 2020-05-20 ENCOUNTER — Ambulatory Visit (INDEPENDENT_AMBULATORY_CARE_PROVIDER_SITE_OTHER): Payer: Medicare PPO

## 2020-05-20 DIAGNOSIS — Z95810 Presence of automatic (implantable) cardiac defibrillator: Secondary | ICD-10-CM | POA: Diagnosis not present

## 2020-05-20 DIAGNOSIS — I5022 Chronic systolic (congestive) heart failure: Secondary | ICD-10-CM

## 2020-05-22 NOTE — Progress Notes (Signed)
EPIC Encounter for ICM Monitoring  Patient Name: [name] is a 79 y.o. male Date: 05/22/2020 Primary Care Physican: Kaleen Mask, MD Primary Cardiologist:Nahser Electrophysiologist:Klein Bi-V Pacing:100% 05/09/2020 OfficeWeight: 219 lbs   Spoke with daughter [name] and pt is doing well.    Optivol thoracic impedancenormal  Labs: 03/15/2020 Creatinine 1.26, BUN 19, Potassium 4.9, Sodium 140, GFR 54-62 02/20/2020 Creatinine 1.27, BUN 18, Potassium 4.5, Sodium 137, GFR 53-61 12/25/2019 Creatinine0.97, BUN16, Potassium4.5, Sodium143, [id number] 11/01/2019 Creatinine1.18, BUN26, Potassium4.9, Sodium139, [id number] A complete set of results can be found in Results Review.  Recommendations: No changes and encouraged to call if experiencing any fluid symptoms.  Follow-up plan: ICM clinic phone appointment on10/18/2021. 91 day device clinic remote transmission 06/10/2020.   EP/Cardiology Office Visits:08/08/2020 with Chelsea Aus, PA.   Copy of ICM check sent to Dr.Klein.  3 month ICM trend: 05/20/2020    1 Year ICM trend:       Karie Soda, RN 05/22/2020 1:46 PM

## 2020-06-01 ENCOUNTER — Other Ambulatory Visit: Payer: Self-pay | Admitting: Cardiovascular Disease

## 2020-06-03 NOTE — Telephone Encounter (Signed)
Pt's age 79, wt 99.7 kg, SCr 1.08, CrCl 76.93, last ov w/ PN 05/09/20.

## 2020-06-10 ENCOUNTER — Ambulatory Visit (INDEPENDENT_AMBULATORY_CARE_PROVIDER_SITE_OTHER): Payer: Medicare PPO

## 2020-06-10 DIAGNOSIS — I5022 Chronic systolic (congestive) heart failure: Secondary | ICD-10-CM

## 2020-06-10 DIAGNOSIS — I469 Cardiac arrest, cause unspecified: Secondary | ICD-10-CM

## 2020-06-11 LAB — CUP PACEART REMOTE DEVICE CHECK
Battery Remaining Longevity: 14 mo
Battery Voltage: 2.89 V
Brady Statistic AP VP Percent: 99.83 %
Brady Statistic AP VS Percent: 0.05 %
Brady Statistic AS VP Percent: 0.11 %
Brady Statistic AS VS Percent: 0 %
Brady Statistic RA Percent Paced: 99.88 %
Brady Statistic RV Percent Paced: 99.94 %
Date Time Interrogation Session: 20211004084722
HighPow Impedance: 47 Ohm
HighPow Impedance: 60 Ohm
Implantable Lead Implant Date: 20040315
Implantable Lead Implant Date: 20100611
Implantable Lead Implant Date: 20100611
Implantable Lead Location: 753858
Implantable Lead Location: 753859
Implantable Lead Location: 753860
Implantable Lead Model: 4196
Implantable Lead Model: 5076
Implantable Lead Model: 6947
Implantable Pulse Generator Implant Date: 20160302
Lead Channel Impedance Value: 304 Ohm
Lead Channel Impedance Value: 399 Ohm
Lead Channel Impedance Value: 456 Ohm
Lead Channel Impedance Value: 475 Ohm
Lead Channel Impedance Value: 551 Ohm
Lead Channel Impedance Value: 893 Ohm
Lead Channel Pacing Threshold Amplitude: 0.75 V
Lead Channel Pacing Threshold Amplitude: 0.875 V
Lead Channel Pacing Threshold Pulse Width: 0.4 ms
Lead Channel Pacing Threshold Pulse Width: 0.4 ms
Lead Channel Sensing Intrinsic Amplitude: 11.625 mV
Lead Channel Sensing Intrinsic Amplitude: 14.25 mV
Lead Channel Sensing Intrinsic Amplitude: 2.125 mV
Lead Channel Sensing Intrinsic Amplitude: 2.125 mV
Lead Channel Setting Pacing Amplitude: 1.5 V
Lead Channel Setting Pacing Amplitude: 1.5 V
Lead Channel Setting Pacing Amplitude: 1.75 V
Lead Channel Setting Pacing Pulse Width: 0.4 ms
Lead Channel Setting Pacing Pulse Width: 1 ms
Lead Channel Setting Sensing Sensitivity: 0.3 mV

## 2020-06-13 NOTE — Progress Notes (Signed)
Remote ICD transmission.   

## 2020-06-28 NOTE — Progress Notes (Signed)
No ICM remote transmission received for 06/24/2020 and next ICM transmission scheduled for 07/15/2020.   

## 2020-07-12 ENCOUNTER — Other Ambulatory Visit: Payer: Self-pay | Admitting: Cardiovascular Disease

## 2020-07-19 NOTE — Progress Notes (Signed)
No ICM remote transmission received for 07/15/2020 and next ICM transmission scheduled for 08/07/2020.

## 2020-08-07 ENCOUNTER — Ambulatory Visit (INDEPENDENT_AMBULATORY_CARE_PROVIDER_SITE_OTHER): Payer: Medicare PPO

## 2020-08-07 DIAGNOSIS — I5022 Chronic systolic (congestive) heart failure: Secondary | ICD-10-CM | POA: Diagnosis not present

## 2020-08-07 DIAGNOSIS — Z95810 Presence of automatic (implantable) cardiac defibrillator: Secondary | ICD-10-CM | POA: Diagnosis not present

## 2020-08-07 NOTE — Progress Notes (Signed)
EPIC Encounter for ICM Monitoring  Patient Name: [name] is a 79 y.o. male Date: 08/07/2020 Primary Care Physican: Kaleen Mask, MD Primary Cardiologist:Nahser Electrophysiologist:Klein Bi-V Pacing:99.9% 05/09/2020 OfficeWeight: 219 lbs   Spoke withdaughter [name] and pt has been eating out some which may have contributed to decreased impedance.    Optivol thoracic impedance suggesting possible fluid accumulation starting 07/15/20 and returned to baseline normal on transmission date 08/05/2020.  Labs: 03/15/2020 Creatinine 1.26, BUN 19, Potassium 4.9, Sodium 140, GFR 54-62 02/20/2020 Creatinine 1.27, BUN 18, Potassium 4.5, Sodium 137, GFR 53-61 12/25/2019 Creatinine0.97, BUN16, Potassium4.5, Sodium143, [id number] 11/01/2019 Creatinine1.18, BUN26, Potassium4.9, Sodium139, GSU11-03 A complete set of results can be found in Results Review.  Recommendations: No changes and encouraged to call if experiencing any fluid symptoms.  Follow-up plan: ICM clinic phone appointment on1/01/2021. 91 day device clinic remote transmission1/12/2020.   EP/Cardiology Office Visits:08/08/2020 with Chelsea Aus, PA.   Copy of ICM check sent to Dr.Klein.   3 month ICM trend: 08/05/2020    1 Year ICM trend:       Karie Soda, RN 08/07/2020 10:53 AM

## 2020-08-08 ENCOUNTER — Other Ambulatory Visit: Payer: Self-pay

## 2020-08-08 ENCOUNTER — Encounter: Payer: Self-pay | Admitting: Physician Assistant

## 2020-08-08 ENCOUNTER — Ambulatory Visit (INDEPENDENT_AMBULATORY_CARE_PROVIDER_SITE_OTHER): Payer: Medicare PPO | Admitting: Physician Assistant

## 2020-08-08 VITALS — BP 134/74 | HR 67 | Ht 71.0 in | Wt 222.8 lb

## 2020-08-08 DIAGNOSIS — R5383 Other fatigue: Secondary | ICD-10-CM

## 2020-08-08 DIAGNOSIS — I5022 Chronic systolic (congestive) heart failure: Secondary | ICD-10-CM | POA: Diagnosis not present

## 2020-08-08 DIAGNOSIS — Z95810 Presence of automatic (implantable) cardiac defibrillator: Secondary | ICD-10-CM

## 2020-08-08 DIAGNOSIS — I2510 Atherosclerotic heart disease of native coronary artery without angina pectoris: Secondary | ICD-10-CM

## 2020-08-08 DIAGNOSIS — I255 Ischemic cardiomyopathy: Secondary | ICD-10-CM | POA: Diagnosis not present

## 2020-08-08 DIAGNOSIS — I480 Paroxysmal atrial fibrillation: Secondary | ICD-10-CM

## 2020-08-08 NOTE — Progress Notes (Signed)
Cardiology Office Note:    Date:  08/08/2020   ID:  [name], DOB 1940/11/26, MRN [id number]  PCP:  Kaleen Mask, MD  [facility] Cardiologist:  Kristeen Miss, MD  [facility] Electrophysiologist:  Sherryl Manges, MD   Chief Complaint: 3 months follow up   History of Present Illness:    [name] is a 79 y.o. male with a hx of CAD s/p anterior MI s/p Cypher stent to LAD in 2005, chronic systolic heart failure, status post BiV ICD, hyperlipidemia, paroxysmal atrial fibrillation and chronic kidney disease seen for follow-up.  Prior history of diaphragmatic stimulation. Seen by EP 03/2020 and plan for close follow up. Last seen by Dr. Elease Hashimoto 05/2020.  ICM monitoring 08/07/2020 showed "Optivol thoracic impedance suggesting possible fluid accumulation starting 07/15/20 and returned to baseline normal on transmission date 08/05/2020".    Here today for follow-up.  He denies further diaphragmatic stimulation.  His main complaint is balance issue and fatigue, especially waking up first thing in the morning.  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  He has dyspnea with exertion without chest tightness or pressure.  Limited activity due to right-sided pain.  Denies orthopnea, PND, syncope, or melena.  He did not notice any difference in his symptoms when higher impedance.   Past Medical History:  Diagnosis Date  . Arthritis    knees, back   . BPH (benign prostatic hypertrophy)   . Chronic kidney disease    BPH  . Chronic systolic heart failure (HCC)    a. s/p MDT single chamber ICD 2004 as part of MASTER study b. upgrade to CRTD 2010; CRTD gen change 2016  . Coronary artery disease    a. s/p anterior MI and CYPHER stent to LAD 2005  . Dyslipidemia   . Gout   . Ischemic cardiomyopathy   . Paroxysmal atrial fibrillation (HCC)   . Ventricular tachycardia [facility])     Past Surgical History:  Procedure Laterality Date  . APPENDECTOMY    . BI-VENTRICULAR IMPLANTABLE  CARDIOVERTER DEFIBRILLATOR UPGRADE N/A 11/07/2014   a. MDT single chamber ICD implanted 2004 as part of MASTER study; upgrade to CRTD 2010; gen change 2016  . CARDIAC CATHETERIZATION  2005  . CARDIOVERSION N/A 07/04/2019   Procedure: CARDIOVERSION;  Surgeon: Wendall Stade, MD;  Location[facility] ENDOSCOPY;  Service: Cardiovascular;  Laterality: N/A;  . CARDIOVERSION N/A 08/17/2019   Procedure: CARDIOVERSION;  Surgeon: Vesta Mixer, MD;  Location: [facility] ENDOSCOPY;  Service: Cardiovascular;  Laterality: N/A;  . CORONARY ANGIOPLASTY     5 stents   . KNEE ARTHROSCOPY    . OTHER SURGICAL HISTORY     right ear surgery as a child   . PROSTATECTOMY  11/06/2011   Procedure: PROSTATECTOMY SUPRAPUBIC;  Surgeon: Kathi Ludwig, MD;  Location: [facility] ORS;  Service: Urology;  Laterality: N/A;  Open Suprapubic Prostatectomy  . TEE WITHOUT CARDIOVERSION N/A 05/16/2019   Procedure: TRANSESOPHAGEAL ECHOCARDIOGRAM (TEE);  Surgeon: Sande Rives, MD;  Location: [facility] ENDOSCOPY;  Service: Cardiology;  Laterality: N/A;    Current Medications: Current Meds  Medication Sig  . allopurinol (ZYLOPRIM) 100 MG tablet Take 100 mg by mouth daily.  [name] amiodarone (PACERONE) 200 MG tablet Take 100 mg by mouth daily.  . carvedilol (COREG) 3.125 MG tablet TAKE 1 TABLET BY MOUTH  TWICE DAILY  . ELIQUIS 5 MG TABS tablet TAKE 1 TABLET TWICE DAILY  . latanoprost (XALATAN) 0.005 % ophthalmic solution Place 1 drop  into both eyes at bedtime.   . naproxen sodium (ALEVE) 220 MG tablet Take 220 mg by mouth daily as needed (pain).  . nitroGLYCERIN (NITROSTAT) 0.4 MG SL tablet Place 1 tablet (0.4 mg total) under the tongue every 5 (five) minutes as needed for chest pain.  Bertram Gala Glycol-Propyl Glycol 0.4-0.3 % SOLN Place 1 drop into both eyes 2 (two) times daily.   . rosuvastatin (CRESTOR) 10 MG tablet TAKE ONE-HALF TABLET BY  MOUTH DAILY  . TURMERIC PO Take 538 mg by mouth daily.      Allergies:   Atorvastatin   Social  History   Socioeconomic History  . Marital status: Married    Spouse name: Not on file  . Number of children: Not on file  . Years of education: Not on file  . Highest education level: Not on file  Occupational History  . Not on file  Tobacco Use  . Smoking status: Never Smoker  . Smokeless tobacco: Never Used  Vaping Use  . Vaping Use: Never used  Substance and Sexual Activity  . Alcohol use: No    Comment: occasional beer   . Drug use: No  . Sexual activity: Not on file  Other Topics Concern  . Not on file  Social History Narrative  . Not on file   Social Determinants of Health   Financial Resource Strain:   . Difficulty of Paying Living Expenses: Not on file  Food Insecurity:   . Worried About Programme researcher, broadcasting/film/video in the Last Year: Not on file  . Ran Out of Food in the Last Year: Not on file  Transportation Needs:   . Lack of Transportation (Medical): Not on file  . Lack of Transportation (Non-Medical): Not on file  Physical Activity:   . Days of Exercise per Week: Not on file  . Minutes of Exercise per Session: Not on file  Stress:   . Feeling of Stress : Not on file  Social Connections:   . Frequency of Communication with Friends and Family: Not on file  . Frequency of Social Gatherings with Friends and Family: Not on file  . Attends Religious Services: Not on file  . Active Member of Clubs or Organizations: Not on file  . Attends Banker Meetings: Not on file  . Marital Status: Not on file     Family History: The patient's family history includes Heart disease in his brother, brother, sister, and sister.    ROS:   Please see the history of present illness.    All other systems reviewed and are negative.   EKGs/Labs/Other Studies Reviewed:    The following studies were reviewed today:  Stress test 12/20/2019  The left ventricular ejection fraction is severely decreased (<30%).  Nuclear stress EF: 23%.  There was no ST segment deviation  noted during stress.  There is a large defect of severe severity present in the basal anteroseptal, basal inferoseptal, mid anterior, mid anteroseptal, mid inferoseptal, apical anterior, apical septal and apex location. This consistent with prior infarct/scar. No ischemia.  Findings consistent with prior myocardial infarction.  This is a high risk study.     EKG:  EKG is not  ordered today.    Recent Labs: 12/25/2019: Hemoglobin 14.3; Platelets 224 12/29/2019: ALT 14 05/09/2020: BUN 17; Creatinine, Ser 1.08; Potassium 4.4; Sodium 139; TSH 6.670  Recent Lipid Panel    Component Value Date/Time   CHOL 115 11/01/2019 0838   TRIG 74 11/01/2019 0838  HDL 41 11/01/2019 0838   CHOLHDL 2.8 11/01/2019 0838   CHOLHDL 4.3 06/24/2016 1201   VLDL 31 (H) 06/24/2016 1201   LDLCALC 59 11/01/2019 0838   LDLDIRECT 66.5 07/21/2011 0926     Physical Exam:    VS:  BP 134/74   Pulse 67   Ht 5\' 11"  (1.803 m)   Wt 222 lb 12.8 oz (101.1 kg)   SpO2 92%   BMI 31.07 kg/m     Wt Readings from Last 3 Encounters:  08/08/20 222 lb 12.8 oz (101.1 kg)  05/09/20 219 lb 12.8 oz (99.7 kg)  03/15/20 221 lb (100.2 kg)     GEN: Well nourished, well developed in no acute distress HEENT: Normal NECK: No JVD; No carotid bruits LYMPHATICS: No lymphadenopathy CARDIAC: RRR, no murmurs, rubs, gallops RESPIRATORY:  Clear to auscultation without rales, wheezing or rhonchi  ABDOMEN: Soft, non-tender, non-distended MUSCULOSKELETAL:  Trace edema; No deformity  SKIN: Warm and dry NEUROLOGIC:  Alert and oriented x 3 PSYCHIATRIC:  Normal affect   ASSESSMENT AND PLAN:    1. CAD - Most recent stress test 12/2019 without evidence of ischemia. Felt high risk due to low EF and prior MI.  -Continue Crestor and Coreg.  Not on aspirin due to need of anticoagulation.  2. Chronic systolic CHF s/p BiV - Device check yesterday showed "Optivol thoracic impedance suggesting possible fluid accumulation starting 07/15/20 and  returned to baseline normal on transmission date 08/05/2020". -Patient denies orthopnea or PND. -Noted lower extremity edema on exam. -He did not notice any difference in his current fluid accumulation on device beginning of November. -I have advised him to keep a log and diary of his weight every day and his symptoms.  Take Lasix if 3 pound weight gain overnight or 5 pounds in 1 week. - He has lasix at home (last used > 2 months ago) but does not remember dose (he will call back with dosage). Advised leg elevation and low sodium diet.   3. PAF -Sinus rhythm on exam.  Previously elevated TSH with normal T4. -No bleeding issue -Continue Eliquis  4.  Balance issue -Patient has arthritis in his knee & hip  -Advised to follow-up with PCP  5.  Fatigue/dyspnea on exertion -His symptoms sounds more due to deconditioning.  Had negative stress test April 2021.  CHF education as above.  Previously noted elevated TSH while on amiodarone.  I have offered him repeat TSH and free T4/T3 check however patient declined.  Encouraged to follow-up with PCP.  Medication Adjustments/Labs and Tests Ordered: Current medicines are reviewed at length with the patient today.  Concerns regarding medicines are outlined above.  No orders of the defined types were placed in this encounter.  No orders of the defined types were placed in this encounter.   Patient Instructions  Medication Instructions:  Your physician recommends that you continue on your current medications as directed. Please refer to the Current Medication list given to you today.  *If you need a refill on your cardiac medications before your next appointment, please call your pharmacy*   Lab Work: None ordered  If you have labs (blood work) drawn today and your tests are completely normal, you will receive your results only by: [name]. MyChart Message (if you have MyChart) OR . A paper copy in the mail If you have any lab test that is abnormal or  we need to change your treatment, we will call you to review the results.   Testing/Procedures:  None ordered    Follow-Up: At [facility], you and your health needs are our priority.  As part of our continuing mission to provide you with exceptional heart care, we have created designated Provider Care Teams.  These Care Teams include your primary Cardiologist (physician) and Advanced Practice Providers (APPs -  Physician Assistants and Nurse Practitioners) who all work together to provide you with the care you need, when you need it.  We recommend signing up for the patient portal called "MyChart".  Sign up information is provided on this After Visit Summary.  MyChart is used to connect with patients for Virtual Visits (Telemedicine).  Patients are able to view lab/test results, encounter notes, upcoming appointments, etc.  Non-urgent messages can be sent to your provider as well.   To learn more about what you can do with MyChart, go to [email or web address].    Your next appointment:   3 month(s)  The format for your next appointment:   In Person  Provider:   You may see Kristeen Miss, MD or one of the following Advanced Practice Providers on your designated Care Team:    Tereso Newcomer, PA-C  Chelsea Aus, PA-C    Other Instructions      Signed, Manson Passey, [place]  08/08/2020 10:36 AM    [facility]

## 2020-08-08 NOTE — Patient Instructions (Signed)
Medication Instructions:  Your physician recommends that you continue on your current medications as directed. Please refer to the Current Medication list given to you today.  *If you need a refill on your cardiac medications before your next appointment, please call your pharmacy*   Lab Work: None ordered  If you have labs (blood work) drawn today and your tests are completely normal, you will receive your results only by: [name] MyChart Message (if you have MyChart) OR . A paper copy in the mail If you have any lab test that is abnormal or we need to change your treatment, we will call you to review the results.   Testing/Procedures: None ordered    Follow-Up: At [facility], you and your health needs are our priority.  As part of our continuing mission to provide you with exceptional heart care, we have created designated Provider Care Teams.  These Care Teams include your primary Cardiologist (physician) and Advanced Practice Providers (APPs -  Physician Assistants and Nurse Practitioners) who all work together to provide you with the care you need, when you need it.  We recommend signing up for the patient portal called "MyChart".  Sign up information is provided on this After Visit Summary.  MyChart is used to connect with patients for Virtual Visits (Telemedicine).  Patients are able to view lab/test results, encounter notes, upcoming appointments, etc.  Non-urgent messages can be sent to your provider as well.   To learn more about what you can do with MyChart, go to [email or web address].    Your next appointment:   3 month(s)  The format for your next appointment:   In Person  Provider:   You may see Kristeen Miss, MD or one of the following Advanced Practice Providers on your designated Care Team:    Tereso Newcomer, PA-C  Chelsea Aus, [place]    Other Instructions

## 2020-09-04 ENCOUNTER — Telehealth: Payer: Self-pay | Admitting: Cardiovascular Disease

## 2020-09-04 ENCOUNTER — Telehealth: Payer: Self-pay | Admitting: Cardiology

## 2020-09-04 NOTE — Telephone Encounter (Signed)
Follow up:   Patient calling to get a apt. Patient spoke Dr. Anne Fu last night and told him to get a DOD with the doctor for SOB. Please Advise

## 2020-09-04 NOTE — Telephone Encounter (Signed)
Called in with symptoms of shortness of breath. Had trouble sleeping. Worried. Has known systolic HF and has ICD with optivol.   Asked him to take lasix 40mg  PO tonight and call office in the morning to be seen if possible by Dr. or DOD or associate.  Told to call 911 if symptoms do not improve overnight.  Appreciated phone call.  Elease Hashimoto, MD

## 2020-09-04 NOTE — Telephone Encounter (Signed)
Spoke with pt who reports increased urination with taking the lasix as recommended by Dr Anne Fu last night.  Pt reports he has been able to rest some with improvement in his SOB.  Pt also reports he took a nitro x 1 last night due to burning in his stomach.  Pt denies current CP.  Pt reports burning subsided after taking nitro and when he had his heart attack in the past it felt like indigestion.  RN had device clinic review pt's transmission which shows normal device function with no arrhythmia and normal fluid level with his Optivol.  Appointment scheduled with Dr Elease Hashimoto 09/10/2020 at 1120am.  Reviewed ED precautions and advised pt to continue medications as prescribed.  Pt verbalizes understanding and agrees with current plan.
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
## 2020-09-10 ENCOUNTER — Ambulatory Visit (INDEPENDENT_AMBULATORY_CARE_PROVIDER_SITE_OTHER): Payer: Medicare PPO

## 2020-09-10 ENCOUNTER — Ambulatory Visit: Payer: Medicare PPO | Admitting: Cardiovascular Disease

## 2020-09-10 ENCOUNTER — Other Ambulatory Visit: Payer: Self-pay

## 2020-09-10 ENCOUNTER — Encounter: Payer: Self-pay | Admitting: Cardiovascular Disease

## 2020-09-10 VITALS — BP 114/64 | HR 82 | Ht 71.0 in | Wt 225.0 lb

## 2020-09-10 DIAGNOSIS — I255 Ischemic cardiomyopathy: Secondary | ICD-10-CM

## 2020-09-10 DIAGNOSIS — I5022 Chronic systolic (congestive) heart failure: Secondary | ICD-10-CM | POA: Diagnosis not present

## 2020-09-10 LAB — CUP PACEART REMOTE DEVICE CHECK
Battery Remaining Longevity: 14 mo
Battery Voltage: 2.88 V
Brady Statistic AP VP Percent: 99.64 %
Brady Statistic AP VS Percent: 0.04 %
Brady Statistic AS VP Percent: 0.33 %
Brady Statistic AS VS Percent: 0 %
Brady Statistic RA Percent Paced: 99.67 %
Brady Statistic RV Percent Paced: 99.96 %
Date Time Interrogation Session: 20220104092225
HighPow Impedance: 46 Ohm
HighPow Impedance: 61 Ohm
Implantable Lead Implant Date: 20040315
Implantable Lead Implant Date: 20100611
Implantable Lead Implant Date: 20100611
Implantable Lead Location: 753858
Implantable Lead Location: 753859
Implantable Lead Location: 753860
Implantable Lead Model: 4196
Implantable Lead Model: 5076
Implantable Lead Model: 6947
Implantable Pulse Generator Implant Date: 20160302
Lead Channel Impedance Value: 342 Ohm
Lead Channel Impedance Value: 399 Ohm
Lead Channel Impedance Value: 418 Ohm
Lead Channel Impedance Value: 456 Ohm
Lead Channel Impedance Value: 589 Ohm
Lead Channel Impedance Value: 836 Ohm
Lead Channel Pacing Threshold Amplitude: 0.75 V
Lead Channel Pacing Threshold Amplitude: 0.875 V
Lead Channel Pacing Threshold Pulse Width: 0.4 ms
Lead Channel Pacing Threshold Pulse Width: 0.4 ms
Lead Channel Sensing Intrinsic Amplitude: 1.875 mV
Lead Channel Sensing Intrinsic Amplitude: 1.875 mV
Lead Channel Sensing Intrinsic Amplitude: 11.625 mV
Lead Channel Sensing Intrinsic Amplitude: 14.25 mV
Lead Channel Setting Pacing Amplitude: 1.5 V
Lead Channel Setting Pacing Amplitude: 1.5 V
Lead Channel Setting Pacing Amplitude: 1.75 V
Lead Channel Setting Pacing Pulse Width: 0.4 ms
Lead Channel Setting Pacing Pulse Width: 1 ms
Lead Channel Setting Sensing Sensitivity: 0.3 mV

## 2020-09-10 LAB — BASIC METABOLIC PANEL
BUN/Creatinine Ratio: 15 (ref 10–24)
BUN: 17 mg/dL (ref 8–27)
CO2: 22 mmol/L (ref 20–29)
Calcium: 8.4 mg/dL — ABNORMAL LOW (ref 8.6–10.2)
Chloride: 101 mmol/L (ref 96–106)
Creatinine, Ser: 1.13 mg/dL (ref 0.76–1.27)
GFR calc Af Amer: 71 mL/min/{1.73_m2} (ref >59)
GFR calc non Af Amer: 61 mL/min/{1.73_m2} (ref >59)
Glucose: 126 mg/dL — ABNORMAL HIGH (ref 65–99)
Potassium: 4.2 mmol/L (ref 3.5–5.2)
Sodium: 137 mmol/L (ref 134–144)

## 2020-09-10 MED ORDER — POTASSIUM CHLORIDE ER 10 MEQ PO TBCR
10.0000 meq | EXTENDED_RELEASE_TABLET | ORAL | 3 refills | Status: DC
Start: 2020-09-11 — End: 2021-10-14

## 2020-09-10 MED ORDER — FUROSEMIDE 40 MG PO TABS
40.0000 mg | ORAL_TABLET | ORAL | 3 refills | Status: DC
Start: 2020-09-11 — End: 2021-09-24

## 2020-09-10 NOTE — Patient Instructions (Addendum)
Medication Instructions:  1) START FUROSEMIDE (Lasix) 40 mg. Take one tablet on Mondays, Wednesdays, and Fridays only 2) START KDUR (potassium) 10 meq. Take one tablet on Mondays, Wednesdays, and Fridays with Lasix *If you need a refill on your cardiac medications before your next appointment, please call your pharmacy*  Lab Work: TODAY! BMET If you have labs (blood work) drawn today and your tests are completely normal, you will receive your results only by: [name] MyChart Message (if you have MyChart) OR . A paper copy in the mail If you have any lab test that is abnormal or we need to change your treatment, we will call you to review the results.  Follow-Up: You are scheduled for an appointment on 10/16/2020 at 11:15AM.

## 2020-09-11 ENCOUNTER — Ambulatory Visit (INDEPENDENT_AMBULATORY_CARE_PROVIDER_SITE_OTHER): Payer: Medicare PPO

## 2020-09-11 DIAGNOSIS — I5022 Chronic systolic (congestive) heart failure: Secondary | ICD-10-CM

## 2020-09-11 DIAGNOSIS — Z95810 Presence of automatic (implantable) cardiac defibrillator: Secondary | ICD-10-CM | POA: Diagnosis not present

## 2020-09-16 NOTE — Progress Notes (Signed)
EPIC Encounter for ICM Monitoring  Patient Name: [name] is a 80 y.o. male Date: 09/16/2020 Primary Care Physican: Kaleen Mask, MD Primary Cardiologist:Nahser Electrophysiologist:Klein Bi-V Pacing:100% 09/10/2020 OfficeWeight: 225lbs   Patient seen in office on 09/10/2020.    Optivol thoracic impedance suggesting possible fluid accumulation 09/05/2020.  Labs: 03/15/2020 Creatinine 1.26, BUN 19, Potassium 4.9, Sodium 140, GFR 54-62 02/20/2020 Creatinine 1.27, BUN 18, Potassium 4.5, Sodium 137, GFR 53-61 12/25/2019 Creatinine0.97, BUN16, Potassium4.5, Sodium143, VKF84-03 11/01/2019 Creatinine1.18, BUN26, Potassium4.9, Sodium139, FVO36-06 A complete set of results can be found in Results Review.  Recommendations: No changes.  Follow-up plan: ICM clinic phone appointment on2/04/2021. 91 day device clinic remote transmission4/01/2021.   EP/Cardiology Office Visits: 10/16/2020 with Tereso Newcomer, PA.  11/11/2020 with Dr Elease Hashimoto   Copy of ICM check sent to Dr.Klein.   3 month ICM trend: 09/10/2020.    1 Year ICM trend:       Karie Soda, RN 09/16/2020 10:40 AM

## 2020-09-24 NOTE — Progress Notes (Signed)
Remote ICD transmission.   

## 2020-10-14 ENCOUNTER — Other Ambulatory Visit: Payer: Self-pay

## 2020-10-14 MED ORDER — AMIODARONE HCL 200 MG PO TABS: 100.0000 mg | ORAL_TABLET | Freq: Every day | ORAL | 3 refills | Status: DC

## 2020-10-15 ENCOUNTER — Ambulatory Visit (INDEPENDENT_AMBULATORY_CARE_PROVIDER_SITE_OTHER): Payer: Medicare PPO

## 2020-10-15 ENCOUNTER — Telehealth: Payer: Self-pay

## 2020-10-15 DIAGNOSIS — Z95810 Presence of automatic (implantable) cardiac defibrillator: Secondary | ICD-10-CM | POA: Diagnosis not present

## 2020-10-15 DIAGNOSIS — I5022 Chronic systolic (congestive) heart failure: Secondary | ICD-10-CM

## 2020-10-15 NOTE — Telephone Encounter (Signed)
Attempted call to daughter and left detailed message stating remote transmission was received per DPR.[name]
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
## 2020-10-15 NOTE — Telephone Encounter (Signed)
ICM call to daughter, [name], per DPR.  Advised automatic remote transmission was not received today and asked if he could manually send a transmission.  She said she will have him send one in.

## 2020-10-15 NOTE — Progress Notes (Signed)
EPIC Encounter for ICM Monitoring  Patient Name: [name] is a 80 y.o. male Date: 10/15/2020 Primary Care Physican: Kaleen Mask, MD Primary Cardiologist:Nahser Electrophysiologist:Klein Bi-V Pacing:99.9% 09/10/2020 OfficeWeight: 225lbs   Attempted call to daughter [name], per [facility].  Transmission reviewed. Left detailed message per DPR regarding remote transmission.  Optivol thoracic impedancenormal but was suggesting possible fluid accumulation 1/24 - 2/4.  Labs: 03/15/2020 Creatinine 1.26, BUN 19, Potassium 4.9, Sodium 140, GFR 54-62 02/20/2020 Creatinine 1.27, BUN 18, Potassium 4.5, Sodium 137, GFR 53-61 12/25/2019 Creatinine0.97, BUN16, Potassium4.5, Sodium143, [id number] 11/01/2019 Creatinine1.18, BUN26, Potassium4.9, Sodium139, [id number] A complete set of results can be found in Results Review.  Recommendations: No changes.  Follow-up plan: ICM clinic phone appointment on3/14/2022. 91 day device clinic remote transmission4/01/2021.  EP/Cardiology Office Visits: 10/16/2020 with Tereso Newcomer, PA.  11/11/2020 with Dr Elease Hashimoto   Copy of ICM check sent to Dr.Klein and Tereso Newcomer, PA as Lorain Childes.  3 month ICM trend: 10/15/2020.    1 Year ICM trend:       Karie Soda, RN 10/15/2020 5:22 PM

## 2020-10-16 ENCOUNTER — Ambulatory Visit: Payer: Medicare PPO | Admitting: Physician Assistant

## 2020-10-16 ENCOUNTER — Encounter: Payer: Self-pay | Admitting: Physician Assistant

## 2020-10-16 ENCOUNTER — Other Ambulatory Visit: Payer: Self-pay

## 2020-10-16 VITALS — BP 100/60 | HR 80 | Ht 71.0 in | Wt 226.0 lb

## 2020-10-16 DIAGNOSIS — I5020 Unspecified systolic (congestive) heart failure: Secondary | ICD-10-CM

## 2020-10-16 DIAGNOSIS — I2510 Atherosclerotic heart disease of native coronary artery without angina pectoris: Secondary | ICD-10-CM

## 2020-10-16 DIAGNOSIS — S81802A Unspecified open wound, left lower leg, initial encounter: Secondary | ICD-10-CM

## 2020-10-16 DIAGNOSIS — Z79899 Other long term (current) drug therapy: Secondary | ICD-10-CM

## 2020-10-16 DIAGNOSIS — I4819 Other persistent atrial fibrillation: Secondary | ICD-10-CM | POA: Diagnosis not present

## 2020-10-16 DIAGNOSIS — Z95810 Presence of automatic (implantable) cardiac defibrillator: Secondary | ICD-10-CM | POA: Diagnosis not present

## 2020-10-16 MED ORDER — AMIODARONE HCL 200 MG PO TABS: 100.0000 mg | ORAL_TABLET | Freq: Every day | ORAL | 3 refills | Status: DC

## 2020-10-16 NOTE — Addendum Note (Signed)
Addended by: Vernard Gambles on: 10/16/2020 02:21 PM   Modules accepted: Orders

## 2020-10-16 NOTE — Patient Instructions (Addendum)
Medication Instructions:  Your physician recommends that you continue on your current medications as directed. Please refer to the Current Medication list given to you today.  *If you need a refill on your cardiac medications before your next appointment, please call your pharmacy*   Lab Work: Cmp, Cbc, Tsh, Amio Level - Today   If you have labs (blood work) drawn today and your tests are completely normal, you will receive your results only by: [name] MyChart Message (if you have MyChart) OR . A paper copy in the mail If you have any lab test that is abnormal or we need to change your treatment, we will call you to review the results.   Testing/Procedures: None ordered    Follow-Up: At [facility], you and your health needs are our priority.  As part of our continuing mission to provide you with exceptional heart care, we have created designated Provider Care Teams.  These Care Teams include your primary Cardiologist (physician) and Advanced Practice Providers (APPs -  Physician Assistants and Nurse Practitioners) who all work together to provide you with the care you need, when you need it.  We recommend signing up for the patient portal called "MyChart".  Sign up information is provided on this After Visit Summary.  MyChart is used to connect with patients for Virtual Visits (Telemedicine).  Patients are able to view lab/test results, encounter notes, upcoming appointments, etc.  Non-urgent messages can be sent to your provider as well.   To learn more about what you can do with MyChart, go to [email or web address].    Your next appointment:   3 month(s)  The format for your next appointment:   In Person  Provider:   You may see Kristeen Miss, MD or one of the following Advanced Practice Providers on your designated Care Team:    Tereso Newcomer, PA-C  Chelsea Aus, [place]    Other Instructions Please call wound center for would on Left leg

## 2020-10-17 ENCOUNTER — Telehealth: Payer: Self-pay

## 2020-10-17 DIAGNOSIS — I2510 Atherosclerotic heart disease of native coronary artery without angina pectoris: Secondary | ICD-10-CM

## 2020-10-17 DIAGNOSIS — I10 Essential (primary) hypertension: Secondary | ICD-10-CM

## 2020-10-17 LAB — COMPREHENSIVE METABOLIC PANEL
ALT: 13 IU/L (ref 0–44)
AST: 19 IU/L (ref 0–40)
Albumin/Globulin Ratio: 1.4 (ref 1.2–2.2)
Albumin: 3.9 g/dL (ref 3.7–4.7)
Alkaline Phosphatase: 87 IU/L (ref 44–121)
BUN/Creatinine Ratio: 15 (ref 10–24)
BUN: 19 mg/dL (ref 8–27)
Bilirubin Total: 0.4 mg/dL (ref 0.0–1.2)
CO2: 21 mmol/L (ref 20–29)
Calcium: 9 mg/dL (ref 8.6–10.2)
Chloride: 103 mmol/L (ref 96–106)
Creatinine, Ser: 1.23 mg/dL (ref 0.76–1.27)
GFR calc Af Amer: 64 mL/min/{1.73_m2} (ref >59)
GFR calc non Af Amer: 55 mL/min/{1.73_m2} — ABNORMAL LOW (ref >59)
Globulin, Total: 2.7 g/dL (ref 1.5–4.5)
Glucose: 78 mg/dL (ref 65–99)
Potassium: 4.2 mmol/L (ref 3.5–5.2)
Sodium: 139 mmol/L (ref 134–144)
Total Protein: 6.6 g/dL (ref 6.0–8.5)

## 2020-10-17 LAB — CBC
Hematocrit: 46.1 % (ref 37.5–51.0)
Hemoglobin: 15.3 g/dL (ref 13.0–17.7)
MCH: 31.2 pg (ref 26.6–33.0)
MCHC: 33.2 g/dL (ref 31.5–35.7)
MCV: 94 fL (ref 79–97)
Platelets: 219 10*3/uL (ref 150–450)
RBC: 4.9 x10E6/uL (ref 4.14–5.80)
RDW: 12.7 % (ref 11.6–15.4)
WBC: 7.4 10*3/uL (ref 3.4–10.8)

## 2020-10-17 LAB — TSH: TSH: 8.36 u[IU]/mL — ABNORMAL HIGH (ref 0.450–4.500)

## 2020-10-17 MED ORDER — LEVOTHYROXINE SODIUM 25 MCG PO TABS: 25.0000 ug | ORAL_TABLET | Freq: Every day | ORAL | 3 refills | Status: DC

## 2020-10-17 NOTE — Telephone Encounter (Signed)
RN spoke with patient regarding lab work results and Synthroid prescribed by Tereso Newcomer, PA-C. Patient to return on 3/28 for repeat labs to check TSH and free T4. Patient verbalized understanding.

## 2020-10-17 NOTE — Telephone Encounter (Signed)
-----   Message from Beatrice Lecher, [place] sent at 10/17/2020  7:41 AM EST ----- Hgb, creatinine, K+, LFTs normal.  TSH mildly elevated.  Thyroid function is likely low due to Amiodarone.  He will need replacement Rx.   PLAN:  - Continue current meds. - Start Synthroid 0.25 mg once daily  - TSH, Free T4 in 6-8 weeks - I will fwd to Dr. Graciela Husbands and Luster Landsberg as Marquis Buggy, PA-C    10/17/2020 7:34 AM

## 2020-11-04 ENCOUNTER — Other Ambulatory Visit: Payer: Medicare PPO

## 2020-11-11 ENCOUNTER — Ambulatory Visit: Payer: Medicare PPO | Admitting: Cardiovascular Disease

## 2020-11-18 ENCOUNTER — Ambulatory Visit (INDEPENDENT_AMBULATORY_CARE_PROVIDER_SITE_OTHER): Payer: Medicare PPO

## 2020-11-18 DIAGNOSIS — I5022 Chronic systolic (congestive) heart failure: Secondary | ICD-10-CM

## 2020-11-18 DIAGNOSIS — Z95810 Presence of automatic (implantable) cardiac defibrillator: Secondary | ICD-10-CM

## 2020-11-20 ENCOUNTER — Telehealth: Payer: Self-pay

## 2020-11-20 NOTE — Progress Notes (Signed)
EPIC Encounter for ICM Monitoring  Patient Name: DEVONNE LALANI is a 80 y.o. male Date: 11/20/2020 Primary Care Physican: Kaleen Mask, MD Primary Cardiologist:Nahser Electrophysiologist:Klein Bi-V Pacing:99.9% 2/9/2022OfficeWeight: 226lbs   Attempted call to daughter [name], per [facility].  Transmission reviewed. Left detailed message per DPR regarding remote transmission.  Optivol thoracic impedancenormal.  Labs: 10/16/2020 Creatinine 1.23, BUN 19, Potassium 4.2, Sodium 139, GFR 55-64 09/10/2020 Creatinine 1.13, BUN 17, Potassium 4.2, Sodium 137, GFR 61-71  A complete set of results can be found in Results Review.  Recommendations: Left voice mail with ICM number and encouraged to call if experiencing any fluid symptoms.  Follow-up plan: ICM clinic phone appointment on4/18/2022. 91 day device clinic remote transmission4/01/2021.  EP/Cardiology Office Visits: 01/08/2021 with Dr Elease Hashimoto.  Last EP visit was 03/15/2020 and no recall for 2022 OV.  Copy of ICM check sent to Dr.Klein   3 month ICM trend: 11/18/2020.    1 Year ICM trend:       Karie Soda, RN 11/20/2020 12:19 PM

## 2020-11-20 NOTE — Telephone Encounter (Signed)
Remote ICM transmission received.  Attempted call to daughter [name] regarding ICM remote transmission and left detailed message per DPR to return call.  Advised to return call for any fluid symptoms or questions. Next ICM remote transmission scheduled 12/23/2020.

## 2020-11-28 ENCOUNTER — Other Ambulatory Visit: Payer: Self-pay | Admitting: Cardiovascular Disease

## 2020-11-28 NOTE — Telephone Encounter (Signed)
Eliquis 5mg  refill request received. Patient is 80 years old, weight-102.5kg, Crea-1.23 on 10/16/2020, Diagnosis-Afib, and last seen by 12/14/2020, PA on 10/16/20. Dose is appropriate based on dosing criteria. Will send in refill to requested pharmacy.

## 2020-12-02 ENCOUNTER — Other Ambulatory Visit: Payer: Self-pay

## 2020-12-02 ENCOUNTER — Other Ambulatory Visit: Payer: Medicare PPO | Admitting: *Deleted

## 2020-12-02 DIAGNOSIS — I10 Essential (primary) hypertension: Secondary | ICD-10-CM

## 2020-12-02 DIAGNOSIS — I2510 Atherosclerotic heart disease of native coronary artery without angina pectoris: Secondary | ICD-10-CM

## 2020-12-02 LAB — TSH: TSH: 7.78 u[IU]/mL — ABNORMAL HIGH (ref 0.450–4.500)

## 2020-12-02 LAB — T4, FREE: Free T4: 1.25 ng/dL (ref 0.82–1.77)

## 2020-12-03 ENCOUNTER — Telehealth: Payer: Self-pay

## 2020-12-03 DIAGNOSIS — Z79899 Other long term (current) drug therapy: Secondary | ICD-10-CM

## 2020-12-03 MED ORDER — LEVOTHYROXINE SODIUM 50 MCG PO TABS
50.0000 ug | ORAL_TABLET | Freq: Every day | ORAL | 3 refills | Status: DC
Start: 2020-12-03 — End: 2021-01-29

## 2020-12-03 NOTE — Telephone Encounter (Signed)
Called patient reviewed results and PA recommendations.  Educated him to take synthroid 30-60 minutes prior to breakfast and other medications.  Lab appointment scheduled for 01/28/21.  All questions answered.

## 2020-12-03 NOTE — Telephone Encounter (Signed)
-----   Message from Beatrice Lecher, [place] sent at 12/03/2020  4:55 PM EDT ----- TSH slightly improved.  T4 normal.   PLAN:  -Increase Synthroid to 50 mcg once daily (make sure he is taking in AM 30-60 min before eating and before any other meds) -Repeat TSH in 8-12 weeks  Tereso Newcomer, PA-C    12/03/2020 4:51 PM

## 2020-12-10 ENCOUNTER — Ambulatory Visit (INDEPENDENT_AMBULATORY_CARE_PROVIDER_SITE_OTHER): Payer: Medicare PPO

## 2020-12-10 DIAGNOSIS — I255 Ischemic cardiomyopathy: Secondary | ICD-10-CM

## 2020-12-10 LAB — CUP PACEART REMOTE DEVICE CHECK
Battery Remaining Longevity: 10 mo
Battery Voltage: 2.86 V
Brady Statistic AP VP Percent: 99.17 %
Brady Statistic AP VS Percent: 0.05 %
Brady Statistic AS VP Percent: 0.78 %
Brady Statistic AS VS Percent: 0 %
Brady Statistic RA Percent Paced: 99.2 %
Brady Statistic RV Percent Paced: 99.92 %
Date Time Interrogation Session: 20220405090736
HighPow Impedance: 47 Ohm
HighPow Impedance: 65 Ohm
Implantable Lead Implant Date: 20040315
Implantable Lead Implant Date: 20100611
Implantable Lead Implant Date: 20100611
Implantable Lead Location: 753858
Implantable Lead Location: 753859
Implantable Lead Location: 753860
Implantable Lead Model: 4196
Implantable Lead Model: 5076
Implantable Lead Model: 6947
Implantable Pulse Generator Implant Date: 20160302
Lead Channel Impedance Value: 399 Ohm
Lead Channel Impedance Value: 456 Ohm
Lead Channel Impedance Value: 456 Ohm
Lead Channel Impedance Value: 475 Ohm
Lead Channel Impedance Value: 608 Ohm
Lead Channel Impedance Value: 988 Ohm
Lead Channel Pacing Threshold Amplitude: 0.625 V
Lead Channel Pacing Threshold Amplitude: 0.75 V
Lead Channel Pacing Threshold Pulse Width: 0.4 ms
Lead Channel Pacing Threshold Pulse Width: 0.4 ms
Lead Channel Sensing Intrinsic Amplitude: 1.5 mV
Lead Channel Sensing Intrinsic Amplitude: 1.5 mV
Lead Channel Sensing Intrinsic Amplitude: 11.625 mV
Lead Channel Sensing Intrinsic Amplitude: 14.25 mV
Lead Channel Setting Pacing Amplitude: 1.5 V
Lead Channel Setting Pacing Amplitude: 1.5 V
Lead Channel Setting Pacing Amplitude: 1.75 V
Lead Channel Setting Pacing Pulse Width: 0.4 ms
Lead Channel Setting Pacing Pulse Width: 1 ms
Lead Channel Setting Sensing Sensitivity: 0.3 mV

## 2020-12-11 ENCOUNTER — Telehealth: Payer: Self-pay

## 2020-12-11 NOTE — Telephone Encounter (Signed)
Carelink alert received 12/10/20 for Estimated battery longevity 10 months. Increased transmissions from 91 day to monthly. Remote appointment scheduled in Epic. Patient notified and provided dates of transmission secondary to his device requiring manual remote transmissions. Patient appreciative of call. Will continue to monitor for RRT.

## 2020-12-20 ENCOUNTER — Other Ambulatory Visit: Payer: Self-pay | Admitting: Family Medicine

## 2020-12-20 ENCOUNTER — Other Ambulatory Visit: Payer: Self-pay | Admitting: Surgery

## 2020-12-20 NOTE — Progress Notes (Signed)
Remote ICD transmission.   

## 2020-12-23 ENCOUNTER — Other Ambulatory Visit: Payer: Self-pay | Admitting: Family Medicine

## 2020-12-23 ENCOUNTER — Ambulatory Visit (INDEPENDENT_AMBULATORY_CARE_PROVIDER_SITE_OTHER): Payer: Medicare PPO

## 2020-12-23 DIAGNOSIS — I5022 Chronic systolic (congestive) heart failure: Secondary | ICD-10-CM | POA: Diagnosis not present

## 2020-12-23 DIAGNOSIS — Z95810 Presence of automatic (implantable) cardiac defibrillator: Secondary | ICD-10-CM

## 2020-12-23 DIAGNOSIS — G8929 Other chronic pain: Secondary | ICD-10-CM

## 2020-12-23 DIAGNOSIS — R1031 Right lower quadrant pain: Secondary | ICD-10-CM

## 2020-12-25 NOTE — Progress Notes (Signed)
EPIC Encounter for ICM Monitoring  Patient Name: [name] is a 80 y.o. male Date: 12/25/2020 Primary Care Physican: Kaleen Mask, MD Primary Cardiologist:Nahser Electrophysiologist:Klein Bi-V Pacing:99.9% 2/9/2022OfficeWeight: 226lbs   Transmission reviewed.   Optivol thoracic impedancenormal.  Labs: 10/16/2020 Creatinine 1.23, BUN 19, Potassium 4.2, Sodium 139, GFR 55-64 09/10/2020 Creatinine 1.13, BUN 17, Potassium 4.2, Sodium 137, GFR 61-71  A complete set of results can be found in Results Review.  Recommendations: No changes  Follow-up plan: ICM clinic phone appointment on5/31/2022. 91 day device clinic remote transmission7/07/2021.  EP/Cardiology Office Visits: 01/08/2021 with Dr Elease Hashimoto.  Last EP visit was 03/15/2020 and no recall for 2022 OV.  Copy of ICM check sent to Dr.Klein   3 month ICM trend: 12/23/2020.    1 Year ICM trend:       Karie Soda, RN 12/25/2020 2:12 PM

## 2021-01-03 ENCOUNTER — Other Ambulatory Visit: Payer: Self-pay

## 2021-01-03 ENCOUNTER — Ambulatory Visit
Admission: RE | Admit: 2021-01-03 | Discharge: 2021-01-03 | Disposition: A | Discharge status: Home / Self Care | Payer: Medicare PPO | Source: Ambulatory Visit | Attending: Family Medicine | Admitting: Family Medicine

## 2021-01-03 DIAGNOSIS — G8929 Other chronic pain: Secondary | ICD-10-CM

## 2021-01-03 DIAGNOSIS — R1031 Right lower quadrant pain: Secondary | ICD-10-CM

## 2021-01-03 MED ORDER — IOPAMIDOL (ISOVUE-370) INJECTION 76%
80.0000 mL | Freq: Once | INTRAVENOUS | Status: AC | PRN
  Administered 2021-01-03: 80 mL via INTRAVENOUS

## 2021-01-08 ENCOUNTER — Ambulatory Visit: Payer: Medicare PPO | Admitting: Cardiovascular Disease

## 2021-01-13 ENCOUNTER — Ambulatory Visit (INDEPENDENT_AMBULATORY_CARE_PROVIDER_SITE_OTHER): Payer: Medicare PPO

## 2021-01-13 DIAGNOSIS — I255 Ischemic cardiomyopathy: Secondary | ICD-10-CM

## 2021-01-13 LAB — CUP PACEART REMOTE DEVICE CHECK
Battery Remaining Longevity: 9 mo
Battery Voltage: 2.84 V
Brady Statistic AP VP Percent: 99.83 %
Brady Statistic AP VS Percent: 0.03 %
Brady Statistic AS VP Percent: 0.14 %
Brady Statistic AS VS Percent: 0 %
Brady Statistic RA Percent Paced: 99.86 %
Brady Statistic RV Percent Paced: 99.97 %
Date Time Interrogation Session: 20220509083715
HighPow Impedance: 46 Ohm
HighPow Impedance: 61 Ohm
Implantable Lead Implant Date: 20040315
Implantable Lead Implant Date: 20100611
Implantable Lead Implant Date: 20100611
Implantable Lead Location: 753858
Implantable Lead Location: 753859
Implantable Lead Location: 753860
Implantable Lead Model: 4196
Implantable Lead Model: 5076
Implantable Lead Model: 6947
Implantable Pulse Generator Implant Date: 20160302
Lead Channel Impedance Value: 304 Ohm
Lead Channel Impedance Value: 399 Ohm
Lead Channel Impedance Value: 399 Ohm
Lead Channel Impedance Value: 456 Ohm
Lead Channel Impedance Value: 532 Ohm
Lead Channel Impedance Value: 817 Ohm
Lead Channel Pacing Threshold Amplitude: 0.625 V
Lead Channel Pacing Threshold Amplitude: 0.75 V
Lead Channel Pacing Threshold Pulse Width: 0.4 ms
Lead Channel Pacing Threshold Pulse Width: 0.4 ms
Lead Channel Sensing Intrinsic Amplitude: 11.625 mV
Lead Channel Sensing Intrinsic Amplitude: 14.25 mV
Lead Channel Sensing Intrinsic Amplitude: 2 mV
Lead Channel Sensing Intrinsic Amplitude: 2 mV
Lead Channel Setting Pacing Amplitude: 1.5 V
Lead Channel Setting Pacing Amplitude: 1.5 V
Lead Channel Setting Pacing Amplitude: 1.75 V
Lead Channel Setting Pacing Pulse Width: 0.4 ms
Lead Channel Setting Pacing Pulse Width: 1 ms
Lead Channel Setting Sensing Sensitivity: 0.3 mV

## 2021-01-28 ENCOUNTER — Other Ambulatory Visit: Payer: Medicare PPO | Admitting: *Deleted

## 2021-01-28 ENCOUNTER — Other Ambulatory Visit: Payer: Self-pay

## 2021-01-28 DIAGNOSIS — Z79899 Other long term (current) drug therapy: Secondary | ICD-10-CM

## 2021-01-28 LAB — TSH: TSH: 6.56 u[IU]/mL — ABNORMAL HIGH (ref 0.450–4.500)

## 2021-01-29 ENCOUNTER — Telehealth: Payer: Self-pay

## 2021-01-29 DIAGNOSIS — Z79899 Other long term (current) drug therapy: Secondary | ICD-10-CM

## 2021-01-29 MED ORDER — LEVOTHYROXINE SODIUM 50 MCG PO TABS: 50.0000 ug | ORAL_TABLET | Freq: Every day | ORAL | 3 refills | Status: DC

## 2021-01-29 NOTE — Telephone Encounter (Signed)
-----   Message from Beatrice Lecher, [place] sent at 01/28/2021  5:44 PM EDT ----- TSH better but still sl elevated PLAN:  -Increase synthroid to 75 mcg once daily  -TSH in 12 weeks Tereso Newcomer, PA-C    01/28/2021 5:43 PM

## 2021-01-29 NOTE — Telephone Encounter (Signed)
Reviewed results with patient who verbalized understanding.   The patient states he has only been taking levothyroxine 25 mg daily. Instructed him to INCREASE levothyroxine to 50 mg as previously instructed a couple months ago. Repeat blood work scheduled 04/07/21. He understands he will be called if Lorin Picket has further recommendations. He was grateful for call and agrees with plan.

## 2021-01-31 NOTE — Progress Notes (Signed)
Remote ICD transmission.   

## 2021-02-10 ENCOUNTER — Ambulatory Visit: Payer: Medicare PPO | Admitting: Family

## 2021-02-10 ENCOUNTER — Other Ambulatory Visit: Payer: Self-pay | Admitting: Cardiovascular Disease

## 2021-02-13 ENCOUNTER — Ambulatory Visit: Payer: Medicare PPO

## 2021-02-13 ENCOUNTER — Telehealth: Payer: Self-pay

## 2021-02-13 NOTE — Telephone Encounter (Signed)
Patient called in to let us know he was suppose to send in a remote transmission today and his monitor is not working and he called in [employer] and they are sending him a new one and will be there in 7-10 business days

## 2021-02-14 NOTE — Progress Notes (Signed)
No ICM remote transmission received for 02/04/2021 and next ICM transmission scheduled for 03/03/2021.

## 2021-02-17 ENCOUNTER — Ambulatory Visit (INDEPENDENT_AMBULATORY_CARE_PROVIDER_SITE_OTHER): Payer: Medicare PPO

## 2021-02-17 DIAGNOSIS — I5022 Chronic systolic (congestive) heart failure: Secondary | ICD-10-CM | POA: Diagnosis not present

## 2021-02-17 DIAGNOSIS — Z95810 Presence of automatic (implantable) cardiac defibrillator: Secondary | ICD-10-CM | POA: Diagnosis not present

## 2021-02-17 LAB — CUP PACEART REMOTE DEVICE CHECK
Battery Remaining Longevity: 8 mo
Battery Voltage: 2.84 V
Brady Statistic AP VP Percent: 99.85 %
Brady Statistic AP VS Percent: 0.04 %
Brady Statistic AS VP Percent: 0.11 %
Brady Statistic AS VS Percent: 0 %
Brady Statistic RA Percent Paced: 99.89 %
Brady Statistic RV Percent Paced: 99.95 %
Date Time Interrogation Session: 20220611164752
HighPow Impedance: 50 Ohm
HighPow Impedance: 67 Ohm
Implantable Lead Implant Date: 20040315
Implantable Lead Implant Date: 20100611
Implantable Lead Implant Date: 20100611
Implantable Lead Location: 753858
Implantable Lead Location: 753859
Implantable Lead Location: 753860
Implantable Lead Model: 4196
Implantable Lead Model: 5076
Implantable Lead Model: 6947
Implantable Pulse Generator Implant Date: 20160302
Lead Channel Impedance Value: 399 Ohm
Lead Channel Impedance Value: 418 Ohm
Lead Channel Impedance Value: 456 Ohm
Lead Channel Impedance Value: 456 Ohm
Lead Channel Impedance Value: 551 Ohm
Lead Channel Impedance Value: 836 Ohm
Lead Channel Pacing Threshold Amplitude: 0.625 V
Lead Channel Pacing Threshold Amplitude: 0.875 V
Lead Channel Pacing Threshold Pulse Width: 0.4 ms
Lead Channel Pacing Threshold Pulse Width: 0.4 ms
Lead Channel Sensing Intrinsic Amplitude: 11.625 mV
Lead Channel Sensing Intrinsic Amplitude: 14.25 mV
Lead Channel Sensing Intrinsic Amplitude: 2.25 mV
Lead Channel Sensing Intrinsic Amplitude: 2.25 mV
Lead Channel Setting Pacing Amplitude: 1.5 V
Lead Channel Setting Pacing Amplitude: 1.5 V
Lead Channel Setting Pacing Amplitude: 1.75 V
Lead Channel Setting Pacing Pulse Width: 0.4 ms
Lead Channel Setting Pacing Pulse Width: 1 ms
Lead Channel Setting Sensing Sensitivity: 0.3 mV

## 2021-02-18 ENCOUNTER — Telehealth: Payer: Self-pay

## 2021-02-18 NOTE — Progress Notes (Signed)
EPIC Encounter for ICM Monitoring  Patient Name: [name] is a 80 y.o. male Date: 02/18/2021 Primary Care Physican: Kaleen Mask, MD Primary Cardiologist: Nahser Electrophysiologist: Joycelyn Schmid Pacing: 100%        10/16/2020 Office Weight:  226 lbs                                                            Attempted call to daughter, [name] per DPR and unable to reach.  Left detailed message per DPR regarding transmission. Transmission reviewed.    Optivol thoracic impedance normal.    Prescribed: Furosemide 40 mg take 1 tablet every Monday, Wed and Friday Potassium 10 mEq ke 1 tablet every Monday, Wed and Friday  Labs: 10/16/2020 Creatinine 1.23, BUN 19, Potassium 4.2, Sodium 139, GFR 55-64 09/10/2020 Creatinine 1.13, BUN 17, Potassium 4.2, Sodium 137, GFR 61-71  A complete set of results can be found in Results Review.   Recommendations:  Left voice mail with ICM number and encouraged to call if experiencing any fluid symptoms.   Follow-up plan: ICM clinic phone appointment on 03/24/2021.   91 day device clinic remote transmission 03/17/2021.     EP/Cardiology Office Visits:  02/21/2021 with Hubbard Hartshorn, NP.  Last EP visit was 03/15/2020 and no recall for 2022 OV.   Copy of ICM check sent to Dr. Graciela Husbands   3 month ICM trend: 02/17/2021.    1 Year ICM trend:       Karie Soda, RN 02/18/2021 12:44 PM

## 2021-02-18 NOTE — Telephone Encounter (Signed)
Remote ICM transmission received.  Attempted call to daughter, [name], per [facility] regarding ICM remote transmission and left detailed message per DPR.  Advised to return call for any fluid symptoms or questions.

## 2021-02-21 ENCOUNTER — Ambulatory Visit: Payer: Medicare PPO | Admitting: Family

## 2021-02-21 ENCOUNTER — Encounter: Payer: Self-pay | Admitting: Family

## 2021-02-21 ENCOUNTER — Other Ambulatory Visit: Payer: Self-pay

## 2021-02-21 ENCOUNTER — Telehealth: Payer: Self-pay

## 2021-02-21 VITALS — BP 112/60 | HR 62 | Ht 71.0 in | Wt 224.0 lb

## 2021-02-21 DIAGNOSIS — I255 Ischemic cardiomyopathy: Secondary | ICD-10-CM | POA: Diagnosis not present

## 2021-02-21 DIAGNOSIS — I4819 Other persistent atrial fibrillation: Secondary | ICD-10-CM

## 2021-02-21 DIAGNOSIS — I10 Essential (primary) hypertension: Secondary | ICD-10-CM

## 2021-02-21 DIAGNOSIS — Z79899 Other long term (current) drug therapy: Secondary | ICD-10-CM

## 2021-02-21 DIAGNOSIS — Z95810 Presence of automatic (implantable) cardiac defibrillator: Secondary | ICD-10-CM

## 2021-02-21 DIAGNOSIS — Z7901 Long term (current) use of anticoagulants: Secondary | ICD-10-CM

## 2021-02-21 DIAGNOSIS — I5022 Chronic systolic (congestive) heart failure: Secondary | ICD-10-CM

## 2021-02-21 MED ORDER — ELIQUIS 5 MG PO TABS
1.0000 | ORAL_TABLET | Freq: Two times a day (BID) | ORAL | 1 refills | Status: DC
Start: 2021-02-21 — End: 2021-09-12

## 2021-02-21 NOTE — Patient Instructions (Addendum)
Medication Instructions:  Continue your current medications.   Keep up the good work taking your Lasix and Potassium three times per week.   We have sent a refill of your Eliquis to [facility].  *If you need a refill on your cardiac medications before your next appointment, please call your pharmacy*   Lab Work: Lab work 04/07/21 as scheduled.   Testing/Procedures: Your EKG today showed your pacemaker was functioning well.   Follow-Up: At [facility], you and your health needs are our priority.  As part of our continuing mission to provide you with exceptional heart care, we have created designated Provider Care Teams.  These Care Teams include your primary Cardiologist (physician) and Advanced Practice Providers (APPs -  Physician Assistants and Nurse Practitioners) who all work together to provide you with the care you need, when you need it.  We recommend signing up for the patient portal called "MyChart".  Sign up information is provided on this After Visit Summary.  MyChart is used to connect with patients for Virtual Visits (Telemedicine).  Patients are able to view lab/test results, encounter notes, upcoming appointments, etc.  Non-urgent messages can be sent to your provider as well.   To learn more about what you can do with MyChart, go to [email or web address].    Your next appointment:   3-4 month(s)   05/30/2021 ARRIVE AT 10:30   The format for your next appointment:   In Person  Provider:   Kristeen Miss, MD   Other Instructions  Recommend talking to your primary care provider about a referral to physical therapy to work on your balance training.   Heart Healthy Diet Recommendations: A low-salt diet is recommended. Meats should be grilled, baked, or boiled. Avoid fried foods. Focus on lean protein sources like fish or chicken with vegetables and fruits. The American Heart Association is a Chief Technology Officer!  American Heart Association Diet and Lifeystyle Recommendations    Recommend weighing daily and keeping a log. Please call our office if you have weight gain of 2 pounds overnight or 5 pounds in 1 week.   To prevent or reduce lower extremity swelling: Eat a low salt diet. Salt makes the body hold onto extra fluid which causes swelling. Sit with legs elevated. For example, in the recliner or on an ottoman.  Wear knee-high compression stockings during the daytime. Ones labeled 15-20 mmHg provide good compression.

## 2021-02-21 NOTE — Progress Notes (Signed)
Office Visit    Patient Name: [name] Date of Encounter: 02/21/2021  PCP:  Kaleen Mask, MD   [facility]  Cardiologist:  Kristeen Miss, MD  Advanced Practice Provider:  No care team member to display Electrophysiologist:  Sherryl Manges, MD   Chief Complaint    [name] is a 80 y.o. 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   Past Medical History    Past Medical History:  Diagnosis Date   Arthritis    knees, back    BPH (benign prostatic hypertrophy)    Chronic kidney disease    BPH   Chronic systolic heart failure (HCC)    a. s/p MDT single chamber ICD 2004 as part of MASTER study b. upgrade to CRTD 2010; CRTD gen change 2016   Coronary artery disease    a. s/p anterior MI and CYPHER stent to LAD 2005   Dyslipidemia    Gout    Ischemic cardiomyopathy    Paroxysmal atrial fibrillation (HCC)    Ventricular tachycardia (HCC)    Past Surgical History:  Procedure Laterality Date   APPENDECTOMY     BI-VENTRICULAR IMPLANTABLE CARDIOVERTER DEFIBRILLATOR UPGRADE N/A 11/07/2014   a. MDT single chamber ICD implanted 2004 as part of MASTER study; upgrade to CRTD 2010; gen change 2016   CARDIAC CATHETERIZATION  2005   CARDIOVERSION N/A 07/04/2019   Procedure: CARDIOVERSION;  Surgeon: Wendall Stade, MD;  Location: [facility] ENDOSCOPY;  Service: Cardiovascular;  Laterality: N/A;   CARDIOVERSION N/A 08/17/2019   Procedure: CARDIOVERSION;  Surgeon: Vesta Mixer, MD;  Location: [facility] ENDOSCOPY;  Service: Cardiovascular;  Laterality: N/A;   CORONARY ANGIOPLASTY     5 stents    KNEE ARTHROSCOPY     OTHER SURGICAL HISTORY     right ear surgery as a child    PROSTATECTOMY  11/06/2011   Procedure: PROSTATECTOMY  SUPRAPUBIC;  Surgeon: Kathi Ludwig, MD;  Location: [facility] ORS;  Service: Urology;  Laterality: N/A;  Open Suprapubic Prostatectomy   TEE WITHOUT CARDIOVERSION N/A 05/16/2019   Procedure: TRANSESOPHAGEAL ECHOCARDIOGRAM (TEE);  Surgeon: Sande Rives, MD;  Location: [facility] ENDOSCOPY;  Service: Cardiology;  Laterality: N/A;    Allergies  Allergies  Allergen Reactions   Atorvastatin Other (See Comments)    MUSCLE ACHE     History of Present Illness    [name] is a 80 y.o. 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  Prior MI in 2001 treated with PCI and stent of the LAD.  Most recent echocardiogram March 2021 with LVEF 30 to 35%.  Stress test April 2021 negative for ischemia.  At last clinic visit 10/16/2020 there is recent evidence of volume excess on device interrogation however thoracic impedance had returned to normal.  He was euvolemic on examination with NYHA IIb symptoms.  He was encouraged to take his Lasix on a regular basis 3 times per week.  Thyroid function was noted to be low and levothyroxine was initiated.  Device check 12/10/2020 with estimated  battery longevity 10 months.  He has monthly transmissions to continue to assess battery.  On 01/29/2021 it was noted he had not increased levothyroxine to 50 mg as previously instructed and was recommended to do so with repeat blood work scheduled for 04/07/2021.  Most recent device check 02/15/2021 with normal device function and estimated time to ERI of 8 months.  He is a previous middle school vice principal. He presents today for follow-up. Weight down 2 pounds compared to last clinic visit. He tells me he has been working on his diet. Tells me his feet have been swelling some. He does sit with his feet elevated. He  has been trying to be regular on his Lasix and Potassium. This has improved his swelling. He does not drink much throughout the day - tells me his sister calls him a "camel". Reports no chest pain. He enjoys working in his garden and woodworking as well as reading.   EKGs/Labs/Other Studies Reviewed:   The following studies were reviewed today:  EKG:  EKG is ordered today. The EKG performed today demonstrates AV-paced rhythm at 62 bpm with no acute ST/T wave changes.  Recent Labs: 10/16/2020: ALT 13; BUN 19; Creatinine, Ser 1.23; Hemoglobin 15.3; Platelets 219; Potassium 4.2; Sodium 139 01/28/2021: TSH 6.560  Recent Lipid Panel    Component Value Date/Time   CHOL 115 11/01/2019 0838   TRIG 74 11/01/2019 0838   HDL 41 11/01/2019 0838   CHOLHDL 2.8 11/01/2019 0838   CHOLHDL 4.3 06/24/2016 1201   VLDL 31 (H) 06/24/2016 1201   LDLCALC 59 11/01/2019 0838   LDLDIRECT 66.5 07/21/2011 0926   Home Medications   Current Meds  Medication Sig   allopurinol (ZYLOPRIM) 100 MG tablet Take 100 mg by mouth daily.   amiodarone (PACERONE) 200 MG tablet Take 0.5 tablets (100 mg total) by mouth daily.   carvedilol (COREG) 3.125 MG tablet TAKE 1 TABLET BY MOUTH  TWICE DAILY   ELIQUIS 5 MG TABS tablet TAKE 1 TABLET TWICE DAILY   furosemide (LASIX) 40 MG tablet Take 1 tablet (40 mg total) by mouth every Monday, Wednesday, and Friday.   latanoprost (XALATAN) 0.005 % ophthalmic solution Place 1 drop into both eyes at bedtime.    levothyroxine (SYNTHROID) 100 MCG tablet Take 100 mcg by mouth daily before breakfast.   Lysine 500 MG CAPS Take 1 capsule by mouth daily.   naproxen sodium (ALEVE) 220 MG tablet Take 220 mg by mouth daily as needed (pain).   nitroGLYCERIN (NITROSTAT) 0.4 MG SL tablet Place 1 tablet (0.4 mg total) under the tongue every 5 (five) minutes as needed for chest pain.   Polyethyl Glycol-Propyl Glycol 0.4-0.3 % SOLN Place 1 drop into both eyes 2 (two) times daily.   potassium chloride  (KLOR-CON) 10 MEQ tablet Take 1 tablet (10 mEq total) by mouth every Monday, Wednesday, and Friday.   rosuvastatin (CRESTOR) 10 MG tablet TAKE ONE-HALF TABLET BY  MOUTH DAILY   TURMERIC PO Take 538 mg by mouth daily.      Review of Systems    All other systems reviewed and are otherwise negative except as noted above.  Physical Exam    VS:  BP 112/60 (BP Location: Left Arm, Patient Position: Sitting, Cuff Size: Normal)   Pulse 62   Ht 5\' 11"  (1.803 m)   Wt 224 lb (101.6 kg)   SpO2 95%   BMI 31.24 kg/m  , BMI Body mass index is 31.24 kg/m.  Wt Readings from Last  3 Encounters:  02/21/21 224 lb (101.6 kg)  10/16/20 226 lb (102.5 kg)  09/10/20 225 lb (102.1 kg)     GEN: Well nourished, well developed, in no acute distress. HEENT: normal. Neck: Supple, no JVD, carotid bruits, or masses. Cardiac: RRR, no murmurs, rubs, or gallops. No clubbing, cyanosis, edema.  Radials/DP/PT 2+ and equal bilaterally.  Respiratory:  Respirations regular and unlabored, clear to auscultation bilaterally. GI: Soft, nontender, nondistended. MS: No deformity or atrophy. Skin: Warm and dry, no rash. Neuro:  Strength and sensation are intact. Psych: Normal affect.  Assessment & Plan    HFrEF / ICM - Euvolemic and well compensated on exam. Weight down 2 lbs. NYHA II. Encouraged to continue Lasix/Potassium three days per week as this controls his lower extremity edema. Additional GDMT includes Coreg. Unable to add ACE/ARB/ARNI due to relative hypotension. Low salt diet and fluid restriction encouraged.   Biventricular ICD in situ - Continue to follow with EP. Device life approx 8 months, on 30 day checks.   CAD - Stable with no anginal symptoms. No indication for ischemic evaluation. EKG today no acute ST/T wave changes. GDMT includes rosuvastatin, coreg, PRN nitroglycerin. No aspirin due to chronic anticoagulation. Heart healthy diet and regular cardiovascular exercise encouraged.   Persistent atrial  fibrillation / Chronic anticoagulation / On Amiodarone therapy - AV-paced today on EKG suggestive he is maintaining NSR. Continue Amiodarone 100mg , Coreg 3.125mg  BID, Eliquis 5mg  BID. Does not meet dose reduction criteria. CHADS2VASc of 4 (agex2, HF, HTN). Amiodarone and thyroid monitoring, as below. No signs of toxicity.  Hypothyroidism - Continue present dose levothyroxine. Likely due to amiodarone therapy. Plan for repeat lab work 04/07/21 for monitoring.   Balance difficulties - No lightheadedness, near syncope, syncope. Encouraged to discuss possible referral to PT with his primary care provider.    Disposition: Follow up in 3 month(s) with Dr. .   Signed, 06/07/21, NP 02/21/2021, 9:07 AM [facility]

## 2021-02-21 NOTE — Telephone Encounter (Signed)
Attempted ICM Call to patient and left number for return call.

## 2021-02-21 NOTE — Telephone Encounter (Signed)
Spoke with patient.  Advised that I had been contacting his daughter regarding ICM reports due the daughter stated it was difficult for him to hear on the phone.  Daughter is listed on DPR.  Advised I received a message today after his office visit that he would be preferred to be contacted regarding ICM reports.  Reviewed 02/17/2021 ICM report and will contact him for all future reports unless he tells me differently.  He stated it was no problem and appreciated the call.

## 2021-02-28 NOTE — Telephone Encounter (Signed)
Mr. Province had feedback and questions about [employer]'s communication with our team. He thought they would inform us of his monitor not working and they did not. He recevied a new monitor and had a successful transition but felt the communication was lacking (between [employer] and our office).   I clarified that calling us or sending a MyChart message to [place] directly would be helpful. We don't typically get updates like this from [employer].  Mr. Renwick requested a visit with Dr. Graciela Husbands - he is overdue for an EP visits so that has been scheduled for 03/13/21. He mentioned wanting to ask Dr. Graciela Husbands if he has read the book [employer] by Forde Radon because that is a book he felt Dr. Graciela Husbands would enjoy.  Mr. Arrighi has some questions about his currently scheduled remote transmissions. He said he would clarify that with Dr. Mabeline Caras when he comes for his appointment on 7/7.

## 2021-03-04 NOTE — Progress Notes (Signed)
Monthly battery checks are scheduled. 

## 2021-03-13 ENCOUNTER — Ambulatory Visit: Payer: Medicare PPO | Admitting: Internal Medicine

## 2021-03-13 ENCOUNTER — Other Ambulatory Visit: Payer: Self-pay

## 2021-03-13 ENCOUNTER — Encounter: Payer: Self-pay | Admitting: Internal Medicine

## 2021-03-13 VITALS — BP 122/76 | HR 80 | Ht 71.0 in | Wt 229.6 lb

## 2021-03-13 DIAGNOSIS — E032 Hypothyroidism due to medicaments and other exogenous substances: Secondary | ICD-10-CM

## 2021-03-13 DIAGNOSIS — I4819 Other persistent atrial fibrillation: Secondary | ICD-10-CM | POA: Diagnosis not present

## 2021-03-13 DIAGNOSIS — Z79899 Other long term (current) drug therapy: Secondary | ICD-10-CM

## 2021-03-13 DIAGNOSIS — I255 Ischemic cardiomyopathy: Secondary | ICD-10-CM

## 2021-03-13 DIAGNOSIS — I5022 Chronic systolic (congestive) heart failure: Secondary | ICD-10-CM

## 2021-03-13 DIAGNOSIS — Z95810 Presence of automatic (implantable) cardiac defibrillator: Secondary | ICD-10-CM

## 2021-03-13 NOTE — Progress Notes (Signed)
Patient ID: [name], male   DOB: 1941/06/24, 80 y.o.   MRN: [id number]     Patient Care Team: Kaleen Mask, MD as PCP - General Nahser, Deloris Ping, MD as PCP - Cardiology (Cardiology) Duke Salvia, MD as PCP - Electrophysiology (Cardiology) Marcello Fennel, PA-C as Physician Assistant (Urology)   HPI  [name] is a 80 y.o. male seen in followup for  CRT-D upgrade for congestive heart failure in the setting of ischemic heart disease with device generator replacement  2/16.   Hx of Atrial fib on apixoban no bleeding except bruising; he also takes amiodarone  Patient denies symptoms of GI intolerance, sun sensitivity, neurological symptoms attributable to amiodarone.        Today,the patient denies chest pain, shortness of breath, nocturnal dyspnea, orthopnea   There have been no palpitations,or syncope.  Complains of nerve pain by device site ,that occurs intermittently and do not cause a lot of pain -most consistent with diaphragmatic stimulation.  Also complains of fatigue and his legs give way if he walks too far.  This more limiting than chest pain or shortness of breath. Also significant orthostatic LH, particularly in he morning, having to sit for minutes at a time  He spends more time inside the house than outside since his wife died;childrens are struggling with related concerns    wife -[name]           In the morning he has to take minutes prior to arising from bed to stop the dizziness  He sleeps with 2 pillows at night and 1 in between his legs   DATE TEST EF%   1/10 Echo   30-35 %   3/17 Echo  35-40 %   9/20 Echo 25-30%   9/20 TEE 25-30%   3/21 Echo 30-35%    Date Cr K Hgb  7/19 1.02 4.2 15.3  11/20 1.27 4.6 15.8  9/21  1.08 4.4   2/22 1.23 4.2 15.3          Wife passed away in 04-Jan-2016   Past Medical History:  Diagnosis Date   Arthritis    knees, back    BPH (benign prostatic hypertrophy)    Chronic kidney disease    BPH    Chronic systolic heart failure (HCC)    a. s/p MDT single chamber ICD 01-04-03 as part of MASTER study b. upgrade to CRTD 2009-01-03; CRTD gen change 2015-01-04   Coronary artery disease    a. s/p anterior MI and CYPHER stent to LAD 2004-01-04   Dyslipidemia    Gout    Ischemic cardiomyopathy    Paroxysmal atrial fibrillation (HCC)    Ventricular tachycardia (HCC)     Past Surgical History:  Procedure Laterality Date   APPENDECTOMY     BI-VENTRICULAR IMPLANTABLE CARDIOVERTER DEFIBRILLATOR UPGRADE N/A 11/07/2014   a. MDT single chamber ICD implanted 01/04/2003 as part of MASTER study; upgrade to CRTD Jan 03, 2009; gen change 01/04/2015   CARDIAC CATHETERIZATION  01/04/04   CARDIOVERSION N/A 07/04/2019   Procedure: CARDIOVERSION;  Surgeon: Wendall Stade, MD;  Location: [facility] ENDOSCOPY;  Service: Cardiovascular;  Laterality: N/A;   CARDIOVERSION N/A 08/17/2019   Procedure: CARDIOVERSION;  Surgeon: Vesta Mixer, MD;  Location: [facility] ENDOSCOPY;  Service: Cardiovascular;  Laterality: N/A;   CORONARY ANGIOPLASTY     5 stents    KNEE ARTHROSCOPY     OTHER SURGICAL HISTORY     right ear surgery as a child  PROSTATECTOMY  11/06/2011   Procedure: PROSTATECTOMY SUPRAPUBIC;  Surgeon: Kathi Ludwig, MD;  Location: [facility] ORS;  Service: Urology;  Laterality: N/A;  Open Suprapubic Prostatectomy   TEE WITHOUT CARDIOVERSION N/A 05/16/2019   Procedure: TRANSESOPHAGEAL ECHOCARDIOGRAM (TEE);  Surgeon: Sande Rives, MD;  Location: [facility] ENDOSCOPY;  Service: Cardiology;  Laterality: N/A;    Current Outpatient Medications  Medication Sig Dispense Refill   allopurinol (ZYLOPRIM) 100 MG tablet Take 100 mg by mouth daily.     amiodarone (PACERONE) 200 MG tablet Take 0.5 tablets (100 mg total) by mouth daily. 45 tablet 3   apixaban (ELIQUIS) 5 MG TABS tablet Take 1 tablet (5 mg total) by mouth 2 (two) times daily. 180 tablet 1   carvedilol (COREG) 3.125 MG tablet TAKE 1 TABLET BY MOUTH  TWICE DAILY 180 tablet 3   furosemide (LASIX) 40 MG tablet  Take 1 tablet (40 mg total) by mouth every Monday, Wednesday, and Friday. 36 tablet 3   latanoprost (XALATAN) 0.005 % ophthalmic solution Place 1 drop into both eyes at bedtime.      levothyroxine (SYNTHROID) 100 MCG tablet Take 100 mcg by mouth daily before breakfast.     Lysine 500 MG CAPS Take 1 capsule by mouth daily.     naproxen sodium (ALEVE) 220 MG tablet Take 220 mg by mouth daily as needed (pain).     nitroGLYCERIN (NITROSTAT) 0.4 MG SL tablet Place 1 tablet (0.4 mg total) under the tongue every 5 (five) minutes as needed for chest pain. 30 tablet 3   Polyethyl Glycol-Propyl Glycol 0.4-0.3 % SOLN Place 1 drop into both eyes 2 (two) times daily.     potassium chloride (KLOR-CON) 10 MEQ tablet Take 1 tablet (10 mEq total) by mouth every Monday, Wednesday, and Friday. 36 tablet 3   rosuvastatin (CRESTOR) 10 MG tablet TAKE ONE-HALF TABLET BY  MOUTH DAILY 45 tablet 3   TURMERIC PO Take 538 mg by mouth daily.      No current facility-administered medications for this visit.    Allergies  Allergen Reactions   Atorvastatin Other (See Comments)    MUSCLE ACHE    Physical Exam: BP 122/76   Pulse 80   Ht 5\' 11"  (1.803 m)   Wt 229 lb 9.6 oz (104.1 kg)   SpO2 93%   BMI 32.02 kg/m  Well developed and well nourished in no acute distress HENT normal Neck supple with JVP-flat Lungs Clear Device pocket well healed; without hematoma or erythema.  There is no tethering  Regular rate and rhythm, no  gallop No / murmur Abd-soft with active BS No Clubbing cyanosis tr edema Skin-warm and dry A & Oriented  Grossly normal sensory and motor function  ECG: AV pacing with a negative QRS lead V1 and negative lead 1  Assessment and  Plan:  Atrial fibrillation  Ischemic cardiomyopathy and prior stenting  Hypotension precluding ACE/ARB/ASNI/MRA  Orthostatic weakness-- without evidence of orthostatic hypotension  Implantable defibrillator The patient's device was interrogated.  The  information was reviewed. No changes were made in the programming.     Grieving   No interval atrial fibrillation which he is aware--continue  Amiodarone at 100 mg daily.  Needs amiodarone surveillance.    No bleeding Continue apixaban 5 mg twice daily  Orthostatic symptoms without demonstrable orthostatic hypotension.  We will have him raise the head of his bed 4 inches.  Stresses at home following the loss of his wife    No ischemia. Continue carvedilol,  crestor   Discussed extensively the above       [employer] as a scribe for Sherryl Manges, MD.,have documented all relevant documentation on the behalf of Sherryl Manges, MD,as directed by  Sherryl Manges, MD while in the presence of Sherryl Manges, MD.  I, Sherryl Manges, MD, have reviewed all documentation for this visit. The documentation on 03/13/21 for the exam, diagnosis, procedures, and orders are all accurate and complete.

## 2021-03-13 NOTE — Patient Instructions (Addendum)
Medication Instructions:  Your physician recommends that you continue on your current medications as directed. Please refer to the Current Medication list given to you today.  *If you need a refill on your cardiac medications before your next appointment, please call your pharmacy*   Lab Work: TSH and Liver panel today   If you have labs (blood work) drawn today and your tests are completely normal, you will receive your results only by: MyChart Message (if you have MyChart) OR A paper copy in the mail If you have any lab test that is abnormal or we need to change your treatment, we will call you to review the results.   Testing/Procedures: None ordered.    Follow-Up: At [facility], you and your health needs are our priority.  As part of our continuing mission to provide you with exceptional heart care, we have created designated Provider Care Teams.  These Care Teams include your primary Cardiologist (physician) and Advanced Practice Providers (APPs -  Physician Assistants and Nurse Practitioners) who all work together to provide you with the care you need, when you need it.  We recommend signing up for the patient portal called "MyChart".  Sign up information is provided on this After Visit Summary.  MyChart is used to connect with patients for Virtual Visits (Telemedicine).  Patients are able to view lab/test results, encounter notes, upcoming appointments, etc.  Non-urgent messages can be sent to your provider as well.   To learn more about what you can do with MyChart, go to [email or web address].    Your next appointment:   6 month(s)  The format for your next appointment:   In Person  Provider:   Sherryl Manges, MD

## 2021-03-14 LAB — HEPATIC FUNCTION PANEL
ALT: 18 IU/L (ref 0–44)
AST: 21 IU/L (ref 0–40)
Albumin: 4.2 g/dL (ref 3.6–4.6)
Alkaline Phosphatase: 86 IU/L (ref 44–121)
Bilirubin Total: 0.4 mg/dL (ref 0.0–1.2)
Bilirubin, Direct: 0.14 mg/dL (ref 0.00–0.40)
Total Protein: 7.2 g/dL (ref 6.0–8.5)

## 2021-03-14 LAB — TSH: TSH: 6.48 u[IU]/mL — ABNORMAL HIGH (ref 0.450–4.500)

## 2021-03-17 ENCOUNTER — Ambulatory Visit (INDEPENDENT_AMBULATORY_CARE_PROVIDER_SITE_OTHER): Payer: Medicare PPO

## 2021-03-17 DIAGNOSIS — I255 Ischemic cardiomyopathy: Secondary | ICD-10-CM | POA: Diagnosis not present

## 2021-03-17 LAB — CUP PACEART REMOTE DEVICE CHECK
Battery Remaining Longevity: 7 mo
Battery Voltage: 2.82 V
Brady Statistic AP VP Percent: 99.82 %
Brady Statistic AP VS Percent: 0.04 %
Brady Statistic AS VP Percent: 0.14 %
Brady Statistic AS VS Percent: 0 %
Brady Statistic RA Percent Paced: 99.85 %
Brady Statistic RV Percent Paced: 99.94 %
Date Time Interrogation Session: 20220711062904
HighPow Impedance: 47 Ohm
HighPow Impedance: 59 Ohm
Implantable Lead Implant Date: 20040315
Implantable Lead Implant Date: 20100611
Implantable Lead Implant Date: 20100611
Implantable Lead Location: 753858
Implantable Lead Location: 753859
Implantable Lead Location: 753860
Implantable Lead Model: 4196
Implantable Lead Model: 5076
Implantable Lead Model: 6947
Implantable Pulse Generator Implant Date: 20160302
Lead Channel Impedance Value: 342 Ohm
Lead Channel Impedance Value: 361 Ohm
Lead Channel Impedance Value: 456 Ohm
Lead Channel Impedance Value: 456 Ohm
Lead Channel Impedance Value: 551 Ohm
Lead Channel Impedance Value: 874 Ohm
Lead Channel Pacing Threshold Amplitude: 0.75 V
Lead Channel Pacing Threshold Amplitude: 0.875 V
Lead Channel Pacing Threshold Pulse Width: 0.4 ms
Lead Channel Pacing Threshold Pulse Width: 0.4 ms
Lead Channel Sensing Intrinsic Amplitude: 11.625 mV
Lead Channel Sensing Intrinsic Amplitude: 14.25 mV
Lead Channel Sensing Intrinsic Amplitude: 2 mV
Lead Channel Sensing Intrinsic Amplitude: 2 mV
Lead Channel Setting Pacing Amplitude: 1.5 V
Lead Channel Setting Pacing Amplitude: 1.5 V
Lead Channel Setting Pacing Amplitude: 2.25 V
Lead Channel Setting Pacing Pulse Width: 0.4 ms
Lead Channel Setting Pacing Pulse Width: 0.6 ms
Lead Channel Setting Sensing Sensitivity: 0.3 mV

## 2021-03-19 ENCOUNTER — Telehealth: Payer: Self-pay | Admitting: Internal Medicine

## 2021-03-19 NOTE — Telephone Encounter (Signed)
Left message to call back  

## 2021-03-19 NOTE — Telephone Encounter (Signed)
Patient is returning call to discuss lab results. 

## 2021-03-21 MED ORDER — LEVOTHYROXINE SODIUM 125 MCG PO TABS: 125.0000 ug | ORAL_TABLET | Freq: Every day | ORAL | 0 refills | Status: DC

## 2021-03-21 NOTE — Telephone Encounter (Signed)
-----   Message from Duke Salvia, MD sent at 03/15/2021  2:19 PM EDT ----- Please Inform Patient  Labs are normal x TSH remains elevated Lets have him increase his synthroid to 125 mcg /d and follow up with his PCP in 8-12 weeks   Thanks

## 2021-03-21 NOTE — Telephone Encounter (Signed)
The patient has been notified of the result and verbalized understanding.  Allquestions (if any) were answered. Sampson Goon, RN 03/21/2021 11:12 AM     Will send in increase synthroid dose per Dr. Graciela Husbands for 12 weeks supply then PCP to manage. Patient to make appt with PCP to follow up on TSHlevels/management. Will forward lab to PCP.

## 2021-03-21 NOTE — Telephone Encounter (Signed)
[name] is returning [name]'s call for his lab results. Please advise.

## 2021-03-24 ENCOUNTER — Telehealth: Payer: Self-pay

## 2021-03-24 ENCOUNTER — Ambulatory Visit (INDEPENDENT_AMBULATORY_CARE_PROVIDER_SITE_OTHER): Payer: Medicare PPO

## 2021-03-24 DIAGNOSIS — Z95810 Presence of automatic (implantable) cardiac defibrillator: Secondary | ICD-10-CM

## 2021-03-24 DIAGNOSIS — I5022 Chronic systolic (congestive) heart failure: Secondary | ICD-10-CM

## 2021-03-24 NOTE — Telephone Encounter (Signed)
Returned call to patient as requested by voice mail message.  He reports he needs the future dates to send in remote transmissions.  He has trouble with signal in his house and takes it outside to send the report.  Requested he send one today to review fluid levels and next one will be due 8/11.  He will send one today for review.

## 2021-03-24 NOTE — Progress Notes (Signed)
Spoke with patient and heart failure questions reviewed.  Pt asymptomatic for fluid accumulation and feeing well.  No changes and encouraged to call if experiencing any fluid symptoms.

## 2021-03-24 NOTE — Progress Notes (Signed)
EPIC Encounter for ICM Monitoring  Patient Name: [name] is a 80 y.o. male Date: 03/24/2021 Primary Care Physican: Kaleen Mask, MD Primary Cardiologist: Nahser Electrophysiologist: Joycelyn Schmid Pacing: 99.9%        02/21/2021 Office Weight:  224 lbs  Battery Longevity: 7 months (Next Battery Check 8/13)                                                          Attempted call to patient and unable to reach.  Left message to return call. Transmission reviewed.    Optivol thoracic impedance normal.    Prescribed: Furosemide 40 mg take 1 tablet every Monday, Wed and Friday Potassium 10 mEq ke 1 tablet every Monday, Wed and Friday   Labs: 10/16/2020 Creatinine 1.23, BUN 19, Potassium 4.2, Sodium 139, GFR 55-64 09/10/2020 Creatinine 1.13, BUN 17, Potassium 4.2, Sodium 137, GFR 61-71  A complete set of results can be found in Results Review.   Recommendations:  Left voice mail with ICM number and encouraged to call if experiencing any fluid symptoms.   Follow-up plan: ICM clinic phone appointment on 04/17/2021.   91 day device clinic remote transmission 06/19/2021.     EP/Cardiology Office Visits:  05/30/2021 with Dr Elease Hashimoto.  Recall 09/09/2021 with Dr Graciela Husbands.   Copy of ICM check sent to Dr. Graciela Husbands.   3 month ICM trend: 03/24/2021.    1 Year ICM trend:       Karie Soda, RN 03/24/2021 1:08 PM

## 2021-04-07 ENCOUNTER — Other Ambulatory Visit: Payer: Medicare PPO

## 2021-04-09 NOTE — Progress Notes (Signed)
Remote ICD transmission.   

## 2021-04-16 LAB — CUP PACEART REMOTE DEVICE CHECK
Battery Remaining Longevity: 5 mo
Battery Voltage: 2.82 V
Brady Statistic AP VP Percent: 99.84 %
Brady Statistic AP VS Percent: 0.05 %
Brady Statistic AS VP Percent: 0.11 %
Brady Statistic AS VS Percent: 0 %
Brady Statistic RA Percent Paced: 99.88 %
Brady Statistic RV Percent Paced: 99.94 %
Date Time Interrogation Session: 20220810074800
HighPow Impedance: 50 Ohm
HighPow Impedance: 64 Ohm
Implantable Lead Implant Date: 20040315
Implantable Lead Implant Date: 20100611
Implantable Lead Implant Date: 20100611
Implantable Lead Location: 753858
Implantable Lead Location: 753859
Implantable Lead Location: 753860
Implantable Lead Model: 4196
Implantable Lead Model: 5076
Implantable Lead Model: 6947
Implantable Pulse Generator Implant Date: 20160302
Lead Channel Impedance Value: 399 Ohm
Lead Channel Impedance Value: 418 Ohm
Lead Channel Impedance Value: 456 Ohm
Lead Channel Impedance Value: 456 Ohm
Lead Channel Impedance Value: 513 Ohm
Lead Channel Impedance Value: 817 Ohm
Lead Channel Pacing Threshold Amplitude: 0.625 V
Lead Channel Pacing Threshold Amplitude: 0.75 V
Lead Channel Pacing Threshold Pulse Width: 0.4 ms
Lead Channel Pacing Threshold Pulse Width: 0.4 ms
Lead Channel Sensing Intrinsic Amplitude: 1.25 mV
Lead Channel Sensing Intrinsic Amplitude: 1.25 mV
Lead Channel Sensing Intrinsic Amplitude: 11.625 mV
Lead Channel Sensing Intrinsic Amplitude: 14.25 mV
Lead Channel Setting Pacing Amplitude: 1.5 V
Lead Channel Setting Pacing Amplitude: 1.5 V
Lead Channel Setting Pacing Amplitude: 2.25 V
Lead Channel Setting Pacing Pulse Width: 0.4 ms
Lead Channel Setting Pacing Pulse Width: 0.6 ms
Lead Channel Setting Sensing Sensitivity: 0.3 mV

## 2021-04-17 ENCOUNTER — Ambulatory Visit (INDEPENDENT_AMBULATORY_CARE_PROVIDER_SITE_OTHER): Payer: Medicare PPO

## 2021-04-17 DIAGNOSIS — Z95810 Presence of automatic (implantable) cardiac defibrillator: Secondary | ICD-10-CM | POA: Diagnosis not present

## 2021-04-17 DIAGNOSIS — I255 Ischemic cardiomyopathy: Secondary | ICD-10-CM

## 2021-04-17 DIAGNOSIS — I5022 Chronic systolic (congestive) heart failure: Secondary | ICD-10-CM

## 2021-04-18 ENCOUNTER — Telehealth: Payer: Self-pay

## 2021-04-18 NOTE — Telephone Encounter (Signed)
Attempted ICM call and patient returned call ° °

## 2021-04-18 NOTE — Progress Notes (Signed)
EPIC Encounter for ICM Monitoring  Patient Name: [name] is a 80 y.o. male Date: 04/18/2021 Primary Care Physican: Kaleen Mask, MD Primary Cardiologist: Nahser Electrophysiologist: Joycelyn Schmid Pacing: 99.9%        02/21/2021 Office Weight:  224 lbs   Battery Longevity: 5 months (Next Battery check 9/12)                                                          Spoke with patient and heart failure questions reviewed.  Pt asymptomatic for fluid accumulation and feeling well.  Optivol thoracic impedance suggesting normal fluid levels.    Prescribed: Furosemide 40 mg take 1 tablet every Monday, Wed and Friday Potassium 10 mEq ke 1 tablet every Monday, Wed and Friday   Labs: 10/16/2020 Creatinine 1.23, BUN 19, Potassium 4.2, Sodium 139, GFR 55-64 09/10/2020 Creatinine 1.13, BUN 17, Potassium 4.2, Sodium 137, GFR 61-71  A complete set of results can be found in Results Review.   Recommendations:  No changes and encouraged to call if experiencing any fluid symptoms.   Follow-up plan: ICM clinic phone appointment on 05/19/2021.   91 day device clinic remote transmission 06/19/2021.     EP/Cardiology Office Visits:  05/30/2021 with Dr Elease Hashimoto.  Recall 09/09/2021 with Dr Graciela Husbands.   Copy of ICM check sent to Dr. Graciela Husbands.    3 month ICM trend: 04/16/2021.    1 Year ICM trend:       Karie Soda, RN 04/18/2021 12:41 PM

## 2021-05-08 NOTE — Progress Notes (Signed)
Remote ICD transmission.   

## 2021-05-19 ENCOUNTER — Ambulatory Visit (INDEPENDENT_AMBULATORY_CARE_PROVIDER_SITE_OTHER): Payer: Medicare PPO

## 2021-05-19 DIAGNOSIS — Z95810 Presence of automatic (implantable) cardiac defibrillator: Secondary | ICD-10-CM | POA: Diagnosis not present

## 2021-05-19 DIAGNOSIS — I255 Ischemic cardiomyopathy: Secondary | ICD-10-CM

## 2021-05-19 DIAGNOSIS — I5022 Chronic systolic (congestive) heart failure: Secondary | ICD-10-CM

## 2021-05-21 LAB — CUP PACEART REMOTE DEVICE CHECK
Battery Remaining Longevity: 5 mo
Battery Voltage: 2.81 V
Brady Statistic AP VP Percent: 99.81 %
Brady Statistic AP VS Percent: 0.06 %
Brady Statistic AS VP Percent: 0.13 %
Brady Statistic AS VS Percent: 0 %
Brady Statistic RA Percent Paced: 99.87 %
Brady Statistic RV Percent Paced: 99.93 %
Date Time Interrogation Session: 20220912071857
HighPow Impedance: 49 Ohm
HighPow Impedance: 64 Ohm
Implantable Lead Implant Date: 20040315
Implantable Lead Implant Date: 20100611
Implantable Lead Implant Date: 20100611
Implantable Lead Location: 753858
Implantable Lead Location: 753859
Implantable Lead Location: 753860
Implantable Lead Model: 4196
Implantable Lead Model: 5076
Implantable Lead Model: 6947
Implantable Pulse Generator Implant Date: 20160302
Lead Channel Impedance Value: 418 Ohm
Lead Channel Impedance Value: 418 Ohm
Lead Channel Impedance Value: 418 Ohm
Lead Channel Impedance Value: 475 Ohm
Lead Channel Impedance Value: 513 Ohm
Lead Channel Impedance Value: 779 Ohm
Lead Channel Pacing Threshold Amplitude: 0.625 V
Lead Channel Pacing Threshold Amplitude: 0.625 V
Lead Channel Pacing Threshold Pulse Width: 0.4 ms
Lead Channel Pacing Threshold Pulse Width: 0.4 ms
Lead Channel Sensing Intrinsic Amplitude: 1.5 mV
Lead Channel Sensing Intrinsic Amplitude: 1.5 mV
Lead Channel Sensing Intrinsic Amplitude: 11.625 mV
Lead Channel Sensing Intrinsic Amplitude: 14.25 mV
Lead Channel Setting Pacing Amplitude: 1.5 V
Lead Channel Setting Pacing Amplitude: 1.5 V
Lead Channel Setting Pacing Amplitude: 2.25 V
Lead Channel Setting Pacing Pulse Width: 0.4 ms
Lead Channel Setting Pacing Pulse Width: 0.6 ms
Lead Channel Setting Sensing Sensitivity: 0.3 mV

## 2021-05-21 NOTE — Progress Notes (Signed)
EPIC Encounter for ICM Monitoring  Patient Name: [name] is a 80 y.o. male Date: 05/21/2021 Primary Care Physican: Kaleen Mask, MD Primary Cardiologist: Nahser Electrophysiologist: Joycelyn Schmid Pacing: 99.9%        02/21/2021 Office Weight:  224 lbs   Battery Longevity: 5 months                                                           Spoke with patient and heart failure questions reviewed.  Pt asymptomatic for fluid accumulation and feeling well.   Optivol thoracic impedance suggesting normal fluid levels.    Prescribed: Furosemide 40 mg take 1 tablet every Monday, Wed and Friday Potassium 10 mEq ke 1 tablet every Monday, Wed and Friday   Labs: 10/16/2020 Creatinine 1.23, BUN 19, Potassium 4.2, Sodium 139, GFR 55-64 09/10/2020 Creatinine 1.13, BUN 17, Potassium 4.2, Sodium 137, GFR 61-71  A complete set of results can be found in Results Review.   Recommendations:  No changes and encouraged to call if experiencing any fluid symptoms.   Follow-up plan: ICM clinic phone appointment on 06/20/2021.   91 day device clinic remote transmission 06/19/2021.     EP/Cardiology Office Visits:  05/30/2021 with Dr Elease Hashimoto.  Recall 09/09/2021 with Dr Graciela Husbands.   Copy of ICM check sent to Dr. Graciela Husbands.     3 month ICM trend: 05/19/2021.    1 Year ICM trend:       Karie Soda, RN 05/21/2021 2:06 PM

## 2021-05-27 NOTE — Addendum Note (Signed)
Addended by: Elease Etienne A on: 05/27/2021 10:15 AM   Modules accepted: Level of Service

## 2021-05-27 NOTE — Progress Notes (Signed)
Remote ICD transmission.   

## 2021-05-28 ENCOUNTER — Other Ambulatory Visit: Payer: Self-pay | Admitting: Cardiovascular Disease

## 2021-05-30 ENCOUNTER — Encounter: Payer: Self-pay | Admitting: Cardiovascular Disease

## 2021-05-30 ENCOUNTER — Other Ambulatory Visit: Payer: Self-pay

## 2021-05-30 ENCOUNTER — Ambulatory Visit: Payer: Medicare PPO | Admitting: Cardiovascular Disease

## 2021-05-30 VITALS — BP 110/68 | HR 83 | Ht 71.0 in | Wt 228.6 lb

## 2021-05-30 DIAGNOSIS — Z7901 Long term (current) use of anticoagulants: Secondary | ICD-10-CM | POA: Diagnosis not present

## 2021-05-30 DIAGNOSIS — I5022 Chronic systolic (congestive) heart failure: Secondary | ICD-10-CM | POA: Diagnosis not present

## 2021-05-30 DIAGNOSIS — I4819 Other persistent atrial fibrillation: Secondary | ICD-10-CM

## 2021-05-30 DIAGNOSIS — I255 Ischemic cardiomyopathy: Secondary | ICD-10-CM | POA: Diagnosis not present

## 2021-05-30 DIAGNOSIS — I2510 Atherosclerotic heart disease of native coronary artery without angina pectoris: Secondary | ICD-10-CM

## 2021-05-30 LAB — HEPATIC FUNCTION PANEL
ALT: 18 IU/L (ref 0–44)
AST: 24 IU/L (ref 0–40)
Albumin: 4.2 g/dL (ref 3.6–4.6)
Alkaline Phosphatase: 92 IU/L (ref 44–121)
Bilirubin Total: 0.7 mg/dL (ref 0.0–1.2)
Bilirubin, Direct: 0.2 mg/dL (ref 0.00–0.40)
Total Protein: 6.8 g/dL (ref 6.0–8.5)

## 2021-05-30 LAB — LIPID PANEL
Chol/HDL Ratio: 4.6 ratio (ref 0.0–5.0)
Cholesterol, Total: 147 mg/dL (ref 100–199)
HDL: 32 mg/dL — ABNORMAL LOW (ref >39)
LDL Chol Calc (NIH): 83 mg/dL (ref 0–99)
Triglycerides: 189 mg/dL — ABNORMAL HIGH (ref 0–149)
VLDL Cholesterol Cal: 32 mg/dL (ref 5–40)

## 2021-05-30 LAB — CBC
Hematocrit: 47.6 % (ref 37.5–51.0)
Hemoglobin: 16.2 g/dL (ref 13.0–17.7)
MCH: 32 pg (ref 26.6–33.0)
MCHC: 34 g/dL (ref 31.5–35.7)
MCV: 94 fL (ref 79–97)
Platelets: 233 10*3/uL (ref 150–450)
RBC: 5.06 x10E6/uL (ref 4.14–5.80)
RDW: 12.6 % (ref 11.6–15.4)
WBC: 8.1 10*3/uL (ref 3.4–10.8)

## 2021-05-30 LAB — BASIC METABOLIC PANEL
BUN/Creatinine Ratio: 18 (ref 10–24)
BUN: 20 mg/dL (ref 8–27)
CO2: 22 mmol/L (ref 20–29)
Calcium: 9.4 mg/dL (ref 8.6–10.2)
Chloride: 102 mmol/L (ref 96–106)
Creatinine, Ser: 1.11 mg/dL (ref 0.76–1.27)
Glucose: 135 mg/dL — ABNORMAL HIGH (ref 65–99)
Potassium: 4.3 mmol/L (ref 3.5–5.2)
Sodium: 139 mmol/L (ref 134–144)
eGFR: 67 mL/min/{1.73_m2} (ref >59)

## 2021-05-30 NOTE — Patient Instructions (Signed)
Medication Instructions:  Your physician recommends that you continue on your current medications as directed. Please refer to the Current Medication list given to you today.  *If you need a refill on your cardiac medications before your next appointment, please call your pharmacy*   Lab Work: TODAY - CBC, BMET, Lipid, Liver panel If you have labs (blood work) drawn today and your tests are completely normal, you will receive your results only by: MyChart Message (if you have MyChart) OR A paper copy in the mail If you have any lab test that is abnormal or we need to change your treatment, we will call you to review the results.   Testing/Procedures: None Ordered   Follow-Up: At [facility], you and your health needs are our priority.  As part of our continuing mission to provide you with exceptional heart care, we have created designated Provider Care Teams.  These Care Teams include your primary Cardiologist (physician) and Advanced Practice Providers (APPs -  Physician Assistants and Nurse Practitioners) who all work together to provide you with the care you need, when you need it.   Your next appointment:   1 year(s)  The format for your next appointment:   In Person  Provider:   You may see Kristeen Miss, MD or one of the following Advanced Practice Providers on your designated Care Team:   Tereso Newcomer, PA-C [place], [place]
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
## 2021-06-19 ENCOUNTER — Ambulatory Visit (INDEPENDENT_AMBULATORY_CARE_PROVIDER_SITE_OTHER): Payer: Medicare PPO

## 2021-06-19 DIAGNOSIS — I255 Ischemic cardiomyopathy: Secondary | ICD-10-CM

## 2021-06-19 LAB — CUP PACEART REMOTE DEVICE CHECK
Battery Remaining Longevity: 4 mo
Battery Voltage: 2.8 V
Brady Statistic AP VP Percent: 99.62 %
Brady Statistic AP VS Percent: 0.12 %
Brady Statistic AS VP Percent: 0.26 %
Brady Statistic AS VS Percent: 0 %
Brady Statistic RA Percent Paced: 99.73 %
Brady Statistic RV Percent Paced: 99.8 %
Date Time Interrogation Session: 20221013074841
HighPow Impedance: 48 Ohm
HighPow Impedance: 61 Ohm
Implantable Lead Implant Date: 20040315
Implantable Lead Implant Date: 20100611
Implantable Lead Implant Date: 20100611
Implantable Lead Location: 753858
Implantable Lead Location: 753859
Implantable Lead Location: 753860
Implantable Lead Model: 4196
Implantable Lead Model: 5076
Implantable Lead Model: 6947
Implantable Pulse Generator Implant Date: 20160302
Lead Channel Impedance Value: 342 Ohm
Lead Channel Impedance Value: 418 Ohm
Lead Channel Impedance Value: 475 Ohm
Lead Channel Impedance Value: 475 Ohm
Lead Channel Impedance Value: 608 Ohm
Lead Channel Impedance Value: 988 Ohm
Lead Channel Pacing Threshold Amplitude: 0.75 V
Lead Channel Pacing Threshold Amplitude: 0.875 V
Lead Channel Pacing Threshold Pulse Width: 0.4 ms
Lead Channel Pacing Threshold Pulse Width: 0.4 ms
Lead Channel Sensing Intrinsic Amplitude: 11.625 mV
Lead Channel Sensing Intrinsic Amplitude: 14.25 mV
Lead Channel Sensing Intrinsic Amplitude: 2 mV
Lead Channel Sensing Intrinsic Amplitude: 2 mV
Lead Channel Setting Pacing Amplitude: 1.5 V
Lead Channel Setting Pacing Amplitude: 1.5 V
Lead Channel Setting Pacing Amplitude: 2.25 V
Lead Channel Setting Pacing Pulse Width: 0.4 ms
Lead Channel Setting Pacing Pulse Width: 0.6 ms
Lead Channel Setting Sensing Sensitivity: 0.3 mV

## 2021-06-20 ENCOUNTER — Ambulatory Visit (INDEPENDENT_AMBULATORY_CARE_PROVIDER_SITE_OTHER): Payer: Medicare PPO

## 2021-06-20 DIAGNOSIS — Z95810 Presence of automatic (implantable) cardiac defibrillator: Secondary | ICD-10-CM

## 2021-06-20 DIAGNOSIS — I5022 Chronic systolic (congestive) heart failure: Secondary | ICD-10-CM | POA: Diagnosis not present

## 2021-06-20 NOTE — Progress Notes (Signed)
EPIC Encounter for ICM Monitoring  Patient Name: [name] is a 80 y.o. male Date: 06/20/2021 Primary Care Physican: Kaleen Mask, MD Primary Cardiologist: Nahser Electrophysiologist: Joycelyn Schmid Pacing: 99.8%        06/20/2021 Office Weight:  226 lbs   Battery Longevity: 4 months                                                           Spoke with patient and heart failure questions reviewed.  Pt asymptomatic for fluid accumulation and feeling well.   Optivol thoracic impedance suggesting normal fluid levels.    Prescribed: Furosemide 40 mg take 1 tablet every Monday, Wed and Friday Potassium 10 mEq ke 1 tablet every Monday, Wed and Friday   Labs: 10/16/2020 Creatinine 1.23, BUN 19, Potassium 4.2, Sodium 139, GFR 55-64 09/10/2020 Creatinine 1.13, BUN 17, Potassium 4.2, Sodium 137, GFR 61-71  A complete set of results can be found in Results Review.   Recommendations:  No changes and encouraged to call if experiencing any fluid symptoms.   Follow-up plan: ICM clinic phone appointment on 07/21/2021.   91 day device clinic remote transmission 09/22/2021.     EP/Cardiology Office Visits:   Recall 09/09/2021 with Dr Graciela Husbands.   Copy of ICM check sent to Dr. Graciela Husbands.   3 month ICM trend: 06/19/2021.    1 Year ICM trend:       Karie Soda, RN 06/20/2021 4:28 PM

## 2021-06-27 NOTE — Progress Notes (Signed)
Remote ICD transmission.   

## 2021-06-30 ENCOUNTER — Telehealth: Payer: Self-pay | Admitting: Internal Medicine

## 2021-06-30 NOTE — Telephone Encounter (Signed)
Pt c/o medication issue:  1. Name of Medication: levothyroxine (SYNTHROID) 125 MCG tablet   2. How are you currently taking this medication (dosage and times per day)? As directed   3. Are you having a reaction (difficulty breathing--STAT)? no  4. What is your medication issue? Patient said his pharmacy did not get a new rx to refill this medicine. He wanted to know if he still needs to be taking it. Please advise

## 2021-06-30 NOTE — Telephone Encounter (Signed)
Spoke with pt and advised per Dr Graciela Husbands thyroid is now to be followed by PCP.  Pt was advised of this when results of TSH were called to pt on 03/21/2021.  Pt verbalizes understanding and states he will contact PCP tomorrow as PCP completed labs about 10 days ago.

## 2021-07-21 ENCOUNTER — Ambulatory Visit (INDEPENDENT_AMBULATORY_CARE_PROVIDER_SITE_OTHER): Payer: Medicare PPO

## 2021-07-21 DIAGNOSIS — Z95810 Presence of automatic (implantable) cardiac defibrillator: Secondary | ICD-10-CM

## 2021-07-21 DIAGNOSIS — I5022 Chronic systolic (congestive) heart failure: Secondary | ICD-10-CM | POA: Diagnosis not present

## 2021-07-21 DIAGNOSIS — I255 Ischemic cardiomyopathy: Secondary | ICD-10-CM

## 2021-07-22 LAB — CUP PACEART REMOTE DEVICE CHECK
Battery Remaining Longevity: 3 mo
Battery Voltage: 2.78 V
Brady Statistic AP VP Percent: 99.36 %
Brady Statistic AP VS Percent: 0.33 %
Brady Statistic AS VP Percent: 0.31 %
Brady Statistic AS VS Percent: 0 %
Brady Statistic RA Percent Paced: 99.68 %
Brady Statistic RV Percent Paced: 99.66 %
Date Time Interrogation Session: 20221115141042
HighPow Impedance: 47 Ohm
HighPow Impedance: 63 Ohm
Implantable Lead Implant Date: 20040315
Implantable Lead Implant Date: 20100611
Implantable Lead Implant Date: 20100611
Implantable Lead Location: 753858
Implantable Lead Location: 753859
Implantable Lead Location: 753860
Implantable Lead Model: 4196
Implantable Lead Model: 5076
Implantable Lead Model: 6947
Implantable Pulse Generator Implant Date: 20160302
Lead Channel Impedance Value: 342 Ohm
Lead Channel Impedance Value: 418 Ohm
Lead Channel Impedance Value: 418 Ohm
Lead Channel Impedance Value: 456 Ohm
Lead Channel Impedance Value: 551 Ohm
Lead Channel Impedance Value: 874 Ohm
Lead Channel Pacing Threshold Amplitude: 0.625 V
Lead Channel Pacing Threshold Amplitude: 0.625 V
Lead Channel Pacing Threshold Pulse Width: 0.4 ms
Lead Channel Pacing Threshold Pulse Width: 0.4 ms
Lead Channel Sensing Intrinsic Amplitude: 16 mV
Lead Channel Sensing Intrinsic Amplitude: 16 mV
Lead Channel Sensing Intrinsic Amplitude: 2 mV
Lead Channel Sensing Intrinsic Amplitude: 2 mV
Lead Channel Setting Pacing Amplitude: 1.5 V
Lead Channel Setting Pacing Amplitude: 1.5 V
Lead Channel Setting Pacing Amplitude: 2.25 V
Lead Channel Setting Pacing Pulse Width: 0.4 ms
Lead Channel Setting Pacing Pulse Width: 0.6 ms
Lead Channel Setting Sensing Sensitivity: 0.3 mV

## 2021-07-22 NOTE — Progress Notes (Signed)
EPIC Encounter for ICM Monitoring  Patient Name: [name] is a 80 y.o. male Date: 07/22/2021 Primary Care Physican: Kaleen Mask, MD Primary Cardiologist: Nahser Electrophysiologist: Joycelyn Schmid Pacing: 99.7%        06/20/2021 Office Weight:  226 lbs   Battery Longevity: 3 months                                                           Spoke with patient and heart failure questions reviewed.  Pt asymptomatic for fluid accumulation and feeling well.  Pt reports it is hard on him to take Furosemide 3 times a week, keeps him in the bathroom.  He requested I call [name] to discuss my chart and did speak with her.  She reports they have fixed the mychart problem.  Advised if he needs to send a mychart message about the device then to send to Dr Graciela Husbands and will be routed to the device clinic for follow up.    Optivol thoracic impedance suggesting possible minimal amount of fluid accumulation for past 2 days but pt due to take prescribed Furosemide tomorrow, 11/16.    Prescribed: Furosemide 40 mg take 1 tablet every Monday, Wed and Friday Potassium 10 mEq ke 1 tablet every Monday, Wed and Friday   Labs: 10/16/2020 Creatinine 1.23, BUN 19, Potassium 4.2, Sodium 139, GFR 55-64 09/10/2020 Creatinine 1.13, BUN 17, Potassium 4.2, Sodium 137, GFR 61-71  A complete set of results can be found in Results Review.   Recommendations:  No changes and encouraged to call if experiencing any fluid symptoms.   Follow-up plan: ICM clinic phone appointment on 08/25/2021.   91 day device clinic remote transmission 09/22/2021.     EP/Cardiology Office Visits:   Recall 09/09/2021 with Dr Graciela Husbands.   Copy of ICM check sent to Dr. Graciela Husbands.   3 month ICM trend: 07/21/2021.    12-14 Month ICM trend:       Karie Soda, RN 07/22/2021 1:13 PM

## 2021-07-29 NOTE — Addendum Note (Signed)
Addended by: Elease Etienne A on: 07/29/2021 09:17 AM   Modules accepted: Level of Service

## 2021-07-29 NOTE — Progress Notes (Signed)
Remote ICD transmission.   

## 2021-08-12 ENCOUNTER — Telehealth: Payer: Self-pay | Admitting: Cardiovascular Disease

## 2021-08-12 NOTE — Telephone Encounter (Signed)
Spoke with patient.  Advised received remote transmission and fluid levels reviewed.  He reports not sleeping Monday night and did not feel well that night.   Advised the pharmacist will call him to review meds and check if he may be having side effects from one of his medications.   12/6 Optivol suggesting normal fluid levels today but was suggesting a minimal amount of fluid accumulation from 12/1-12/5.  No suggestion of dryness.

## 2021-08-12 NOTE — Telephone Encounter (Signed)
Those are side effects of potassium supplements, but he is taking such a low dose that I find it odd that it would be causing this all of a sudden. He could try stopping it and see if his symptoms improved. Would probably want to check a BMP in about a week to make sure K was still good with lasix. If no improvement, he should see PCP or GI about this

## 2021-08-12 NOTE — Telephone Encounter (Signed)
Pt c/o medication issue:  1. Name of Medication: potassium chloride (KLOR-CON) 10 MEQ tablet  2. How are you currently taking this medication (dosage and times per day)? Take 1 tablet (10 mEq total) by mouth every Monday, Wednesday, and Friday.  3. Are you having a reaction (difficulty breathing--STAT)? no  4. What is your medication issue? Daughter states he is having stomach pain, constipation, diarrhea, and swelling in the abdominal area.  Is there anything they can do to easy his symptoms.   She also wanted to let us know that he might be having knee replacement so he might be needing clearance in the near future.

## 2021-08-12 NOTE — Telephone Encounter (Signed)
I doubt this is related to his heart or the potassium - in high doses, it can cause some GI upset and diarrhea.   PN   With the pharmD and MD advisement, the patient's daughter was advised these symptoms seem to be unlikely related to heart or potassium and they should contact his PCP or GI.  Verbalized understanding.

## 2021-08-12 NOTE — Telephone Encounter (Signed)
Patient complaining of stomach pain, constipation, diarrhea, and swelling in the abdominal area.  Is there anything they can do to easy his symptoms.   The patient feels like this is potassium related, he has been taking the medication for about a year and it has gradually gotten worse, mostly in the last two weeks. He looked at the side effect list from the pharmacy and circled all his symptoms asking his daughter to give [place] a call.   Will reach out to pharmacy for advisement and Randon Goldsmith RN with CHF to run a fluid report.

## 2021-08-12 NOTE — Telephone Encounter (Signed)
Spoke with patient.  Advised would like to check remote transmission fluid levels since he is having some symptoms.  Advised monitor is disconnected and will need to check if the monitor is plugged.  Requested to send manual remote transmission for review.  He agreed to send report.

## 2021-08-21 ENCOUNTER — Ambulatory Visit (INDEPENDENT_AMBULATORY_CARE_PROVIDER_SITE_OTHER): Payer: Medicare PPO

## 2021-08-21 DIAGNOSIS — I255 Ischemic cardiomyopathy: Secondary | ICD-10-CM

## 2021-08-22 LAB — CUP PACEART REMOTE DEVICE CHECK
Battery Remaining Longevity: 2 mo
Battery Voltage: 2.76 V
Brady Statistic AP VP Percent: 98.71 %
Brady Statistic AP VS Percent: 0.34 %
Brady Statistic AS VP Percent: 0.94 %
Brady Statistic AS VS Percent: 0.01 %
Brady Statistic RA Percent Paced: 99.01 %
Brady Statistic RV Percent Paced: 99.14 %
Date Time Interrogation Session: 20221216131505
HighPow Impedance: 48 Ohm
HighPow Impedance: 61 Ohm
Implantable Lead Implant Date: 20040315
Implantable Lead Implant Date: 20100611
Implantable Lead Implant Date: 20100611
Implantable Lead Location: 753858
Implantable Lead Location: 753859
Implantable Lead Location: 753860
Implantable Lead Model: 4196
Implantable Lead Model: 5076
Implantable Lead Model: 6947
Implantable Pulse Generator Implant Date: 20160302
Lead Channel Impedance Value: 342 Ohm
Lead Channel Impedance Value: 399 Ohm
Lead Channel Impedance Value: 456 Ohm
Lead Channel Impedance Value: 475 Ohm
Lead Channel Impedance Value: 589 Ohm
Lead Channel Impedance Value: 931 Ohm
Lead Channel Pacing Threshold Amplitude: 0.625 V
Lead Channel Pacing Threshold Amplitude: 0.75 V
Lead Channel Pacing Threshold Pulse Width: 0.4 ms
Lead Channel Pacing Threshold Pulse Width: 0.4 ms
Lead Channel Sensing Intrinsic Amplitude: 1.875 mV
Lead Channel Sensing Intrinsic Amplitude: 1.875 mV
Lead Channel Sensing Intrinsic Amplitude: 16 mV
Lead Channel Sensing Intrinsic Amplitude: 16 mV
Lead Channel Setting Pacing Amplitude: 1.5 V
Lead Channel Setting Pacing Amplitude: 1.5 V
Lead Channel Setting Pacing Amplitude: 2.25 V
Lead Channel Setting Pacing Pulse Width: 0.4 ms
Lead Channel Setting Pacing Pulse Width: 0.6 ms
Lead Channel Setting Sensing Sensitivity: 0.3 mV

## 2021-08-25 ENCOUNTER — Ambulatory Visit (INDEPENDENT_AMBULATORY_CARE_PROVIDER_SITE_OTHER): Payer: Medicare PPO

## 2021-08-25 DIAGNOSIS — Z95810 Presence of automatic (implantable) cardiac defibrillator: Secondary | ICD-10-CM

## 2021-08-25 DIAGNOSIS — I5022 Chronic systolic (congestive) heart failure: Secondary | ICD-10-CM | POA: Diagnosis not present

## 2021-08-27 NOTE — Progress Notes (Signed)
EPIC Encounter for ICM Monitoring  Patient Name: [name] is a 80 y.o. male Date: 08/27/2021 Primary Care Physican: Kaleen Mask, MD Primary Cardiologist: Nahser Electrophysiologist: Joycelyn Schmid Pacing: 99.1%        06/20/2021 Office Weight:  226 lbs   Battery Longevity: 2 months                                                           Spoke with patient and heart failure questions reviewed.  Pt asymptomatic for fluid accumulation and feeling well.  Pt would like to talk to physician about getting a knee replacement.  Message sent to EP scheduler since he is due for routine OV with Dr Graciela Husbands 09/2021.   Optivol thoracic impedance suggesting normal fluid levels.   Prescribed: Furosemide 40 mg take 1 tablet every Monday, Wed and Friday Potassium 10 mEq ke 1 tablet every Monday, Wed and Friday   Labs: 10/16/2020 Creatinine 1.23, BUN 19, Potassium 4.2, Sodium 139, GFR 55-64 09/10/2020 Creatinine 1.13, BUN 17, Potassium 4.2, Sodium 137, GFR 61-71  A complete set of results can be found in Results Review.   Recommendations:  No changes and encouraged to call if experiencing any fluid symptoms.   Follow-up plan: ICM clinic phone appointment on 09/29/2021.   91 day device clinic remote transmission 09/22/2021.     EP/Cardiology Office Visits:   Recall 09/09/2021 with Dr Graciela Husbands.   Copy of ICM check sent to Dr. Graciela Husbands.    3 month ICM trend: 08/22/2021.    12-14 Month ICM trend:       Karie Soda, RN 08/27/2021 8:43 AM

## 2021-09-02 NOTE — Progress Notes (Signed)
Remote ICD transmission.   

## 2021-09-02 NOTE — Addendum Note (Signed)
Addended by: Elease Etienne A on: 09/02/2021 02:57 PM   Modules accepted: Level of Service

## 2021-09-12 ENCOUNTER — Other Ambulatory Visit: Payer: Self-pay | Admitting: Family

## 2021-09-12 DIAGNOSIS — I4819 Other persistent atrial fibrillation: Secondary | ICD-10-CM

## 2021-09-12 DIAGNOSIS — Z7901 Long term (current) use of anticoagulants: Secondary | ICD-10-CM

## 2021-09-12 NOTE — Telephone Encounter (Signed)
Prescription refill request for Eliquis received. Indication:Afib Last office visit:9/22 Scr:1.1 Age: 81 Weight:103.7 kg  Prescription refilled

## 2021-09-12 NOTE — Telephone Encounter (Signed)
Eliquis refill  

## 2021-09-22 ENCOUNTER — Ambulatory Visit (INDEPENDENT_AMBULATORY_CARE_PROVIDER_SITE_OTHER): Payer: Medicare PPO

## 2021-09-22 DIAGNOSIS — I255 Ischemic cardiomyopathy: Secondary | ICD-10-CM | POA: Diagnosis not present

## 2021-09-23 ENCOUNTER — Telehealth: Payer: Self-pay | Admitting: *Deleted

## 2021-09-23 ENCOUNTER — Telehealth: Payer: Self-pay | Admitting: Internal Medicine

## 2021-09-23 ENCOUNTER — Other Ambulatory Visit: Payer: Self-pay | Admitting: Orthopedic Surgery

## 2021-09-23 LAB — CUP PACEART REMOTE DEVICE CHECK
Battery Remaining Longevity: 2 mo
Battery Voltage: 2.74 V
Brady Statistic AP VP Percent: 98.03 %
Brady Statistic AP VS Percent: 0.32 %
Brady Statistic AS VP Percent: 1.64 %
Brady Statistic AS VS Percent: 0.01 %
Brady Statistic RA Percent Paced: 98.22 %
Brady Statistic RV Percent Paced: 99.02 %
Date Time Interrogation Session: 20230117102953
HighPow Impedance: 48 Ohm
HighPow Impedance: 62 Ohm
Implantable Lead Implant Date: 20040315
Implantable Lead Implant Date: 20100611
Implantable Lead Implant Date: 20100611
Implantable Lead Location: 753858
Implantable Lead Location: 753859
Implantable Lead Location: 753860
Implantable Lead Model: 4196
Implantable Lead Model: 5076
Implantable Lead Model: 6947
Implantable Pulse Generator Implant Date: 20160302
Lead Channel Impedance Value: 399 Ohm
Lead Channel Impedance Value: 418 Ohm
Lead Channel Impedance Value: 456 Ohm
Lead Channel Impedance Value: 456 Ohm
Lead Channel Impedance Value: 551 Ohm
Lead Channel Impedance Value: 893 Ohm
Lead Channel Pacing Threshold Amplitude: 0.625 V
Lead Channel Pacing Threshold Amplitude: 0.75 V
Lead Channel Pacing Threshold Pulse Width: 0.4 ms
Lead Channel Pacing Threshold Pulse Width: 0.4 ms
Lead Channel Sensing Intrinsic Amplitude: 16 mV
Lead Channel Sensing Intrinsic Amplitude: 16 mV
Lead Channel Sensing Intrinsic Amplitude: 2.25 mV
Lead Channel Sensing Intrinsic Amplitude: 2.25 mV
Lead Channel Setting Pacing Amplitude: 1.5 V
Lead Channel Setting Pacing Amplitude: 1.5 V
Lead Channel Setting Pacing Amplitude: 2.25 V
Lead Channel Setting Pacing Pulse Width: 0.4 ms
Lead Channel Setting Pacing Pulse Width: 0.6 ms
Lead Channel Setting Sensing Sensitivity: 0.3 mV

## 2021-09-23 NOTE — Telephone Encounter (Signed)
Pt wishes to confirm his next remote transmission.  Pt advised he is scheduled for remote transmission on 09/29/2020.  Pt verbalizes understanding and agrees with current plan.

## 2021-09-23 NOTE — Telephone Encounter (Signed)
° °  Primary Cardiologist: Kristeen Miss, MD  Chart reviewed as part of pre-operative protocol coverage. Given past medical history and time since last visit, based on ACC/AHA guidelines, [name] would be at acceptable risk for the planned procedure without further cardiovascular testing.   His RCRI is a class III risk, 6.6% risk of major cardiac event.  He is able to complete greater than 4 METS of physical activity.  Patient with diagnosis of Afib on Eliquis for anticoagulation.     Procedure: LEFT TOTAL KNEE ARTHROPLASTY Date of procedure: 10/06/21     CHA2DS2-VASc Score = 4   This indicates a 4.8% annual risk of stroke. The patient's score is based upon: CHF History: 1 HTN History: 0 Diabetes History: 0 Stroke History: 0 Vascular Disease History: 1 Age Score: 2 Gender Score: 0       CrCl 63.9 ml/min   Per office protocol, patient can hold Eliquis for 3 days prior to procedure.    For orthopedic procedures please be sure to resume therapeutic (not prophylactic) dosing.  Patient was advised that if he develops new symptoms prior to surgery to contact our office to arrange a follow-up appointment.  He verbalized understanding.  I will route this recommendation to the requesting party via Epic fax function and remove from pre-op pool.  Please call with questions.  [name]. Danyella Mcginty NP-C    09/23/2021, 12:40 PM [facility] [address] Office [phone] Fax [phone]

## 2021-09-23 NOTE — Telephone Encounter (Signed)
° °  Pre-operative Risk Assessment    Patient Name: [name]  DOB: 05/23/1941 MRN: [id number]      Request for Surgical Clearance    Procedure:   LEFT TOTAL KNEE ARTHROPLASTY  Date of Surgery:  Clearance 10/06/21                                 Surgeon:  DR. Jodi Geralds Surgeon's Group or Practice Name:  [facility] Phone number:  [phone] Fax number:  [phone] ATTN: JUDY DANIELS   Type of Clearance Requested:   - Medical  - Pharmacy:  Hold Apixaban (Eliquis)     Type of Anesthesia:  Spinal   Additional requests/questions:    Elpidio Anis   09/23/2021, 9:55 AM

## 2021-09-23 NOTE — Telephone Encounter (Signed)
Patient with diagnosis of Afib on Eliquis for anticoagulation.    Procedure: LEFT TOTAL KNEE ARTHROPLASTY Date of procedure: 10/06/21   CHA2DS2-VASc Score = 4   This indicates a 4.8% annual risk of stroke. The patient's score is based upon: CHF History: 1 HTN History: 0 Diabetes History: 0 Stroke History: 0 Vascular Disease History: 1 Age Score: 2 Gender Score: 0      CrCl 63.9 ml/min  Per office protocol, patient can hold Eliquis for 3 days prior to procedure.    For orthopedic procedures please be sure to resume therapeutic (not prophylactic) dosing.

## 2021-09-23 NOTE — Telephone Encounter (Signed)
° °  Pt requesting to speak with Dr. Lubertha Basque nurse, he said he needs Dr. Ladona Ridgel know he is having knee replacement surgery and he wants Dr. Ladona Ridgel to approved it. I informed him that his surgeon needs to send clearance request to Dr. Ladona Ridgel, he said ok and he still wants a call back from Dr. Lubertha Basque nurse

## 2021-09-24 ENCOUNTER — Other Ambulatory Visit: Payer: Self-pay | Admitting: Cardiovascular Disease

## 2021-09-29 ENCOUNTER — Ambulatory Visit: Payer: Medicare PPO | Admitting: Internal Medicine

## 2021-09-29 ENCOUNTER — Encounter: Payer: Self-pay | Admitting: Internal Medicine

## 2021-09-29 ENCOUNTER — Other Ambulatory Visit: Payer: Self-pay

## 2021-09-29 ENCOUNTER — Ambulatory Visit (INDEPENDENT_AMBULATORY_CARE_PROVIDER_SITE_OTHER): Payer: Medicare PPO

## 2021-09-29 VITALS — BP 122/72 | HR 75 | Ht 71.0 in | Wt 236.4 lb

## 2021-09-29 DIAGNOSIS — Z95810 Presence of automatic (implantable) cardiac defibrillator: Secondary | ICD-10-CM

## 2021-09-29 DIAGNOSIS — I5022 Chronic systolic (congestive) heart failure: Secondary | ICD-10-CM | POA: Diagnosis not present

## 2021-09-29 DIAGNOSIS — I4819 Other persistent atrial fibrillation: Secondary | ICD-10-CM | POA: Diagnosis not present

## 2021-09-29 DIAGNOSIS — E032 Hypothyroidism due to medicaments and other exogenous substances: Secondary | ICD-10-CM

## 2021-09-29 DIAGNOSIS — Z79899 Other long term (current) drug therapy: Secondary | ICD-10-CM

## 2021-09-29 DIAGNOSIS — I255 Ischemic cardiomyopathy: Secondary | ICD-10-CM

## 2021-09-29 LAB — CUP PACEART INCLINIC DEVICE CHECK
Battery Remaining Longevity: 2 mo
Battery Voltage: 2.7 V
Brady Statistic AP VP Percent: 98.92 %
Brady Statistic AP VS Percent: 0.18 %
Brady Statistic AS VP Percent: 0.89 %
Brady Statistic AS VS Percent: 0.01 %
Brady Statistic RA Percent Paced: 99.05 %
Brady Statistic RV Percent Paced: 99.56 %
Date Time Interrogation Session: 20230123172740
HighPow Impedance: 53 Ohm
HighPow Impedance: 71 Ohm
Implantable Lead Implant Date: 20040315
Implantable Lead Implant Date: 20100611
Implantable Lead Implant Date: 20100611
Implantable Lead Location: 753858
Implantable Lead Location: 753859
Implantable Lead Location: 753860
Implantable Lead Model: 4196
Implantable Lead Model: 5076
Implantable Lead Model: 6947
Implantable Pulse Generator Implant Date: 20160302
Lead Channel Impedance Value: 1026 Ohm
Lead Channel Impedance Value: 456 Ohm
Lead Channel Impedance Value: 475 Ohm
Lead Channel Impedance Value: 475 Ohm
Lead Channel Impedance Value: 513 Ohm
Lead Channel Impedance Value: 665 Ohm
Lead Channel Pacing Threshold Amplitude: 0.75 V
Lead Channel Pacing Threshold Amplitude: 0.75 V
Lead Channel Pacing Threshold Amplitude: 2.75 V
Lead Channel Pacing Threshold Pulse Width: 0.4 ms
Lead Channel Pacing Threshold Pulse Width: 0.4 ms
Lead Channel Pacing Threshold Pulse Width: 0.6 ms
Lead Channel Sensing Intrinsic Amplitude: 1.875 mV
Lead Channel Sensing Intrinsic Amplitude: 1.875 mV
Lead Channel Sensing Intrinsic Amplitude: 16 mV
Lead Channel Sensing Intrinsic Amplitude: 18.125 mV
Lead Channel Setting Pacing Amplitude: 1.5 V
Lead Channel Setting Pacing Amplitude: 1.5 V
Lead Channel Setting Pacing Amplitude: 2.25 V
Lead Channel Setting Pacing Pulse Width: 0.4 ms
Lead Channel Setting Pacing Pulse Width: 1.5 ms
Lead Channel Setting Sensing Sensitivity: 0.3 mV

## 2021-09-29 MED ORDER — FUROSEMIDE 40 MG PO TABS: 40.0000 mg | ORAL_TABLET | Freq: Every day | ORAL | 3 refills | Status: DC

## 2021-09-29 NOTE — Progress Notes (Signed)
Patient ID: [name], male   DOB: [birth year], 81 y.o.   MRN: [id number]     Patient Care Team: Kaleen MaskElkins, Wilson Oliver, MD as PCP - General Nahser, Deloris PingPhilip J, MD as PCP - Cardiology (Cardiology) Duke SalviaKlein, Malan Werk C, MD as PCP - Electrophysiology (Cardiology) Marcello FennelBruning, Ashlyn, PA-C as Physician Assistant (Urology)   HPI  [name] is a 81 y.o. male seen in followup for  CRT-D upgrade for congestive heart failure in the setting of ischemic heart disease with device generator replacement  2/16.   Hx of Atrial fib on apixoban; he also takes amiodarone.  No bleeding  Patient denies symptoms of GI intolerance, sun sensitivity, neurological symptoms attributable to amiodarone.     Denies chest pain or shortness of breath.  Some peripheral edema.  More of late.  Ambulation is limited largely by his knees.  He is able to walk into the grocery store without stopping without dyspnea.  Sometimes that his knees hurt "like the Lynford HumphreyDickens "    He spends more time inside the house than outside since his wife died [id number]  ;childrens are struggling with related concerns    wife -Thurston Holenne       DATE TEST EF%   1/10 Echo   30-35 %   3/17 Echo  35-40 %   9/20 Echo 25-30%   9/20 TEE 25-30%   3/21 Echo 30-35%   4/21 Myoview  23% Fixed perfusion defect-Anteroseptal No ischemia   Date Cr K Hgb TSH ALT  7/19 1.02 4.2 15.3]    11/20 1.27 4.6 15.8    9/21  1.08 4.4     2/22 1.23 4.2 15.3 8.36   9/22 1.11 4.3 16.2 6.48 (7/22) 18          Wife passed away in 2017   Past Medical History:  Diagnosis Date   Arthritis    knees, back    BPH (benign prostatic hypertrophy)    Chronic kidney disease    BPH   Chronic systolic heart failure (HCC)    a. s/p MDT single chamber ICD 2004 as part of MASTER study b. upgrade to CRTD 2010; CRTD gen change 2016   Coronary artery disease    a. s/p anterior MI and CYPHER stent to LAD 2005   Dyslipidemia    Gout    Ischemic cardiomyopathy    Paroxysmal  atrial fibrillation (HCC)    Ventricular tachycardia     Past Surgical History:  Procedure Laterality Date   APPENDECTOMY     BI-VENTRICULAR IMPLANTABLE CARDIOVERTER DEFIBRILLATOR UPGRADE N/A 11/07/2014   a. MDT single chamber ICD implanted 2004 as part of MASTER study; upgrade to CRTD 2010; gen change 2016   CARDIAC CATHETERIZATION  2005   CARDIOVERSION N/A 07/04/2019   Procedure: CARDIOVERSION;  Surgeon: Wendall StadeNishan, Peter C, MD;  [facility];  Service: Cardiovascular;  Laterality: N/A;   CARDIOVERSION N/A 08/17/2019   Procedure: CARDIOVERSION;  Surgeon: Vesta MixerNahser, Philip J, MD;  [facility];  Service: Cardiovascular;  Laterality: N/A;   CORONARY ANGIOPLASTY     5 stents    KNEE ARTHROSCOPY     OTHER SURGICAL HISTORY     right ear surgery as a child    PROSTATECTOMY  11/06/2011   Procedure: PROSTATECTOMY SUPRAPUBIC;  Surgeon: Kathi LudwigSigmund I Tannenbaum, MD;  Location: [facility];  Service: Urology;  Laterality: N/A;  Open Suprapubic Prostatectomy   TEE WITHOUT CARDIOVERSION N/A 05/16/2019   Procedure: TRANSESOPHAGEAL ECHOCARDIOGRAM (TEE);  Surgeon: Sande Rives'Neal, Luray Thomas,  MD;  Location: [facility] ENDOSCOPY;  Service: Cardiology;  Laterality: N/A;    Current Outpatient Medications  Medication Sig Dispense Refill   allopurinol (ZYLOPRIM) 100 MG tablet Take 100 mg by mouth daily.     amiodarone (PACERONE) 200 MG tablet Take 0.5 tablets (100 mg total) by mouth daily. 45 tablet 3   aspirin EC 81 MG tablet Take 81 mg by mouth every 4 (four) hours as needed for moderate pain. Swallow whole.     calcium carbonate (TUMS - DOSED IN MG ELEMENTAL CALCIUM) 500 MG chewable tablet Chew 1 tablet by mouth daily as needed for indigestion or heartburn.     carvedilol (COREG) 3.125 MG tablet TAKE 1 TABLET TWICE DAILY 180 tablet 2   ELIQUIS 5 MG TABS tablet TAKE 1 TABLET TWICE DAILY 180 tablet 1   furosemide (LASIX) 40 MG tablet TAKE 1 TABLET BY MOUTH EVERY MONDAY, WEDNESDAY, AND FRIDAY 36 tablet 3   ibuprofen  (ADVIL) 200 MG tablet Take 200 mg by mouth every 6 (six) hours as needed for moderate pain.     latanoprost (XALATAN) 0.005 % ophthalmic solution Place 1 drop into both eyes at bedtime.      Lysine 500 MG CAPS Take 1 capsule by mouth daily.     naproxen sodium (ALEVE) 220 MG tablet Take 220 mg by mouth daily as needed (pain).     nitroGLYCERIN (NITROSTAT) 0.4 MG SL tablet Place 1 tablet (0.4 mg total) under the tongue every 5 (five) minutes as needed for chest pain. 30 tablet 3   Polyethyl Glycol-Propyl Glycol 0.4-0.3 % SOLN Place 1 drop into both eyes daily.     rosuvastatin (CRESTOR) 10 MG tablet TAKE ONE-HALF TABLET BY  MOUTH DAILY 45 tablet 3   TURMERIC PO Take 1 tablet by mouth daily.     levothyroxine (SYNTHROID) 125 MCG tablet Take 1 tablet (125 mcg total) by mouth daily before breakfast. 84 tablet 0   potassium chloride (KLOR-CON) 10 MEQ tablet Take 1 tablet (10 mEq total) by mouth every Monday, Wednesday, and Friday. 36 tablet 3   No current facility-administered medications for this visit.    Allergies  Allergen Reactions   Atorvastatin Other (See Comments)    MUSCLE ACHE    Physical Exam: BP 122/72    Pulse 75    Ht 5\' 11"  (1.803 m)    Wt 236 lb 6.4 oz (107.2 kg)    SpO2 95%    BMI 32.97 kg/m  Well developed and well nourished in no acute distress HENT normal Neck supple with [id number] Clear Device pocket well healed; without hematoma or erythema.  There is no tethering  Regular rate and rhythm, no  gallop No  murmur Abd-soft with active BS No Clubbing cyanosis 1-2+ edema Skin-warm and dry A & Oriented  Grossly normal sensory and motor function  ECG AV pacing w intermittent BIV pacing competing with RV pacing    Assessment and  Plan:  Atrial fibrillation persistent   Ischemic cardiomyopathy and prior stenting  Hypotension precluding ACE/ARB/ASNI/MRA  Orthostatic weakness-- without evidence of orthostatic hypotension  Implantable defibrillator Medtronic    Preoperative evaluation    Device is approaching ERI.  About 2 months.  Also noted to have LV threshold above the programmed output, noted on his ECG.  Increased pulse width from 0.6--1.5 ms.  LV pacing was confirmed.  As he approaches generator replacement, we will need to carefully consider whether we replace his Medtronic device with a device as  it does not use a Doubler and would delay the need for generator replacement the next time.  No interval atrial fibrillation of which she is aware.  We will continue his amiodarone at 100 mg daily.  Needs amiodarone surveillance laboratories.  Hypotension has precluded up titration of his afterload medications.  We will continue for now carvedilol 3.125 mg twice daily and consider at the next visit trying spironolactone 12.5.  He has knee surgery upcoming.  His functional status is  hard to determine given the precluding effect of knee pain.  I think when he has things that he has to do i.e. going shopping, he is able to function at the level around 4 METS.  Have reiterated that he should hold his Eliquis for 72 hours and his NSAIDs starting today

## 2021-09-29 NOTE — Patient Instructions (Signed)
Medication Instructions:  Your physician has recommended you make the following change in your medication:   Begin taking Furosemide 40mg  - 1 tablet by mouth daily  *If you need a refill on your cardiac medications before your next appointment, please call your pharmacy*   Lab Work: Liver panel and TSH today  If you have labs (blood work) drawn today and your tests are completely normal, you will receive your results only by: MyChart Message (if you have MyChart) OR A paper copy in the mail If you have any lab test that is abnormal or we need to change your treatment, we will call you to review the results.   Testing/Procedures: None ordered.    Follow-Up: At [facility], you and your health needs are our priority.  As part of our continuing mission to provide you with exceptional heart care, we have created designated Provider Care Teams.  These Care Teams include your primary Cardiologist (physician) and Advanced Practice Providers (APPs -  Physician Assistants and Nurse Practitioners) who all work together to provide you with the care you need, when you need it.  We recommend signing up for the patient portal called "MyChart".  Sign up information is provided on this After Visit Summary.  MyChart is used to connect with patients for Virtual Visits (Telemedicine).  Patients are able to view lab/test results, encounter notes, upcoming appointments, etc.  Non-urgent messages can be sent to your provider as well.   To learn more about what you can do with MyChart, go to [facility].    Your next appointment:   12/01/2021 at 1040am with 12/03/2021, PA-C

## 2021-09-30 ENCOUNTER — Encounter (HOSPITAL_COMMUNITY): Payer: Self-pay

## 2021-09-30 ENCOUNTER — Encounter: Payer: Self-pay | Admitting: Internal Medicine

## 2021-09-30 LAB — HEPATIC FUNCTION PANEL
ALT: 18 IU/L (ref 0–44)
AST: 21 IU/L (ref 0–40)
Albumin: 4.3 g/dL (ref 3.6–4.6)
Alkaline Phosphatase: 89 IU/L (ref 44–121)
Bilirubin Total: 0.5 mg/dL (ref 0.0–1.2)
Bilirubin, Direct: 0.13 mg/dL (ref 0.00–0.40)
Total Protein: 6.6 g/dL (ref 6.0–8.5)

## 2021-09-30 LAB — TSH: TSH: 1.63 u[IU]/mL (ref 0.450–4.500)

## 2021-09-30 NOTE — Patient Instructions (Addendum)
DUE TO COVID-19 ONLY ONE VISITOR IS ALLOWED TO COME WITH YOU AND STAY IN THE WAITING ROOM ONLY DURING PRE OP AND PROCEDURE DAY OF SURGERY IF YOU ARE GOING HOME AFTER SURGERY. IF YOU ARE SPENDING THE NIGHT 2 PEOPLE MAY VISIT WITH YOU IN YOUR PRIVATE ROOM AFTER SURGERY UNTIL VISITING  HOURS ARE OVER AT 800 PM AND 1  VISITOR  MAY  SPEND THE NIGHT.   [name]               [name]    Your procedure is scheduled on: 10/06/21   Report to [facility] Main  Entrance   Report to admitting at  7:50 AM     Call this number if you have problems the morning of surgery [phone]    No food after midnight.    You may have clear liquid until 7:30 AM.    At 7:15 AM drink pre surgery drink.   Nothing by mouth after 7:30 AM.     CLEAR LIQUID DIET   Foods Allowed                                                                     Foods Excluded  Coffee and tea, regular and decaf                             liquids that you cannot  Plain Jell-O any favor except red or purple                                           see through such as: Fruit ices (not with fruit pulp)                                     milk, soups, orange juice  Iced Popsicles                                    All solid food Carbonated beverages, regular and diet                                    Cranberry, grape and apple juices Sports drinks like Gatorade Lightly seasoned clear broth or consume(fat free) Sugar     BRUSH YOUR TEETH MORNING OF SURGERY AND RINSE YOUR MOUTH OUT, NO CHEWING GUM CANDY OR MINTS.     Take these medicines the morning of surgery with A SIP OF WATER: Amiodarone, Carvedilol, Allopurinol, Levothyroxine  Stop taking Eliquis___________on _1/27_________as instructed by __Dr. Kline___________.  Stop taking __ASA__________as directed by your Surgeon/Cardiologist.  Contact your Surgeon/Cardiologist for instructions on Anticoagulant Therapy prior to surgery.                                   You may not have any metal  on your body including               piercings  Do not wear jewelry,  lotions, powders or  deodorant              Men may shave face and neck.   Do not bring valuables to the hospital. [facility] IS NOT             RESPONSIBLE   FOR VALUABLES.  Contacts, dentures or bridgework may not be worn into surgery.     Patients discharged the day of surgery will not be allowed to drive home.  IF YOU ARE HAVING SURGERY AND GOING HOME THE SAME DAY, YOU MUST HAVE AN ADULT TO DRIVE YOU HOME AND BE WITH YOU FOR 24 HOURS. YOU MAY GO HOME BY TAXI OR UBER OR ORTHERWISE, BUT AN ADULT MUST ACCOMPANY YOU HOME AND STAY WITH YOU FOR 24 HOURS.  Name and phone number of your driver:  Special Instructions: N/A              Please read over the following fact sheets you were given: _____________________________________________________________________             [facility] Pre[facility] Before surgery, you can play an important role.  Because skin is not sterile, your skin needs to be as free of germs as possible.  You can reduce the number of germs on your skin by washing with CHG (chlorahexidine gluconate) soap before surgery.  CHG is an antiseptic cleaner which kills germs and bonds with the skin to continue killing germs even after washing. Please DO NOT use if you have an allergy to CHG or antibacterial soaps.  If your skin becomes reddened/irritated stop using the CHG and inform your nurse when you arrive at Short Stay. You may shave your face/neck. Please follow these instructions carefully:  1.  Shower with CHG Soap the night before surgery and the  morning of Surgery.  2.  If you choose to wash your hair, wash your hair first as usual with your  normal  shampoo.  3.  After you shampoo, rinse your hair and body thoroughly to remove the  shampoo.                            4.  Use CHG as you would any other liquid soap.  You can apply chg directly  to the skin and  wash                       Gently with a scrungie or clean washcloth.  5.  Apply the CHG Soap to your body ONLY FROM THE NECK DOWN.   Do not use on face/ open                           Wound or open sores. Avoid contact with eyes, ears mouth and genitals (private parts).                       Wash face,  Genitals (private parts) with your normal soap.             6.  Wash thoroughly, paying special attention to the area where your surgery  will be performed.  7.  Thoroughly rinse your body with warm water from the neck down.  8.  DO NOT shower/wash with your normal soap after using and rinsing off  the CHG Soap.                9.  Pat yourself dry with a clean towel.            10.  Wear clean pajamas.            11.  Place clean sheets on your bed the night of your first shower and do not  sleep with pets. Day of Surgery : Do not apply any lotions/deodorants the morning of surgery.  Please wear clean clothes to the hospital/surgery center.   ________________________________________________________________________   Incentive Spirometer  An incentive spirometer is a tool that can help keep your lungs clear and active. This tool measures how well you are filling your lungs with each breath. Taking long deep breaths may help reverse or decrease the chance of developing breathing (pulmonary) problems (especially infection) following: A long period of time when you are unable to move or be active. BEFORE THE PROCEDURE  If the spirometer includes an indicator to show your best effort, your nurse or respiratory therapist will set it to a desired goal. If possible, sit up straight or lean slightly forward. Try not to slouch. Hold the incentive spirometer in an upright position. INSTRUCTIONS FOR USE  Sit on the edge of your bed if possible, or sit up as far as you can in bed or on a chair. Hold the incentive spirometer in an upright position. Breathe out normally. Place the mouthpiece in your  mouth and seal your lips tightly around it. Breathe in slowly and as deeply as possible, raising the piston or the ball toward the top of the column. Hold your breath for 3-5 seconds or for as long as possible. Allow the piston or ball to fall to the bottom of the column. Remove the mouthpiece from your mouth and breathe out normally. Rest for a few seconds and repeat Steps 1 through 7 at least 10 times every 1-2 hours when you are awake. Take your time and take a few normal breaths between deep breaths. The spirometer may include an indicator to show your best effort. Use the indicator as a goal to work toward during each repetition. After each set of 10 deep breaths, practice coughing to be sure your lungs are clear. If you have an incision (the cut made at the time of surgery), support your incision when coughing by placing a pillow or rolled up towels firmly against it. Once you are able to get out of bed, walk around indoors and cough well. You may stop using the incentive spirometer when instructed by your caregiver.  RISKS AND COMPLICATIONS Take your time so you do not get dizzy or light-headed. If you are in pain, you may need to take or ask for pain medication before doing incentive spirometry. It is harder to take a deep breath if you are having pain. AFTER USE Rest and breathe slowly and easily. It can be helpful to keep track of a log of your progress. Your caregiver can provide you with a simple table to help with this. If you are using the spirometer at home, follow these instructions: SEEK MEDICAL CARE IF:  You are having difficultly using the spirometer. You have trouble using the spirometer as often as instructed. Your pain medication is not giving enough relief while using the spirometer. You develop fever of 100.5 F (38.1 C) or higher. SEEK  IMMEDIATE MEDICAL CARE IF:  You cough up bloody sputum that had not been present before. You develop fever of 102 F (38.9 C) or  greater. You develop worsening pain at or near the incision site. MAKE SURE YOU:  Understand these instructions. Will watch your condition. Will get help right away if you are not doing well or get worse. Document Released: 01/04/2007 Document Revised: 11/16/2011 Document Reviewed: 03/07/2007 [facility] Patient Information 2014 [place].   ________________________________________________________________________

## 2021-09-30 NOTE — Progress Notes (Signed)
PERIOPERATIVE PRESCRIPTION FOR IMPLANTED CARDIAC DEVICE PROGRAMMING  Patient Information: Name:  [name]  DOB:  03/29/1941  MRN:  [id number]    Vonzella Nipple, RN  P Cv Div Heartcare Device Planned Procedure:  Left total Knee arthroscopy  Surgeon:  Dr. Jodi Geralds  Date of Procedure:  10/06/21  Cautery will be used.  Position during surgery:     Please send documentation back to:  [facility] (Fax # [phone])   Vonzella Nipple, RN  09/30/2021 8:54 AM  Device Information:  Clinic EP Physician:  Sherryl Manges, MD   Device Type:  Defibrillator Manufacturer and Phone #:  Medtronic: [id number] Pacemaker Dependent?:  No. Date of Last Device Check:  09/29/21 Normal Device Function?:  Yes.    Electrophysiologist's Recommendations:  Have magnet available. Provide continuous ECG monitoring when magnet is used or reprogramming is to be performed.  Procedure should not interfere with device function.  No device programming or magnet placement needed.  Per Device Clinic Standing Orders, Linton Ham, RN  10:13 AM 09/30/2021

## 2021-09-30 NOTE — Progress Notes (Signed)
EPIC Encounter for ICM Monitoring  Patient Name: [name] is a 81 y.o. male Date: 09/30/2021 Primary Care Physican: Kaleen Mask, MD Primary Cardiologist: Nahser Electrophysiologist: Joycelyn Schmid Pacing: 98.2%        09/29/2021 Office Weight:  236 lbs   Battery Longevity: 2 months                                                           Spoke with patient and heart failure questions reviewed.  Pt asymptomatic for fluid accumulation.  Reports feeling well at this time and voices no complaints.    Optivol thoracic impedance suggesting normal fluid levels.   Prescribed: Furosemide 40 mg take 1 tablet every Monday, Wed and Friday Potassium 10 mEq ke 1 tablet every Monday, Wed and Friday   Labs: 10/16/2020 Creatinine 1.23, BUN 19, Potassium 4.2, Sodium 139, GFR 55-64 09/10/2020 Creatinine 1.13, BUN 17, Potassium 4.2, Sodium 137, GFR 61-71  A complete set of results can be found in Results Review.   Recommendations:  No changes and encouraged to call if experiencing any fluid symptoms.   Follow-up plan: ICM clinic phone appointment on 11/03/2021.   91 day device clinic remote transmission 12/01/2021.     EP/Cardiology Office Visits:  None   Copy of ICM check sent to Dr. Graciela Husbands.   3 month ICM trend: 09/29/2021.    12-14 Month ICM trend:     Karie Soda, RN 09/30/2021 2:07 PM

## 2021-10-01 ENCOUNTER — Encounter (HOSPITAL_COMMUNITY)
Admission: RE | Admit: 2021-10-01 | Discharge: 2021-10-01 | Disposition: A | Discharge status: Home / Self Care | Payer: Medicare PPO | Source: Ambulatory Visit | Attending: Orthopedic Surgery | Admitting: Orthopedic Surgery

## 2021-10-01 ENCOUNTER — Ambulatory Visit (HOSPITAL_COMMUNITY)
Admission: RE | Admit: 2021-10-01 | Discharge: 2021-10-01 | Disposition: A | Discharge status: Home / Self Care | Payer: Medicare PPO | Source: Ambulatory Visit | Attending: Orthopedic Surgery | Admitting: Orthopedic Surgery

## 2021-10-01 ENCOUNTER — Encounter (HOSPITAL_COMMUNITY): Payer: Self-pay

## 2021-10-01 ENCOUNTER — Telehealth: Payer: Self-pay | Admitting: Internal Medicine

## 2021-10-01 ENCOUNTER — Other Ambulatory Visit: Payer: Self-pay

## 2021-10-01 VITALS — BP 97/62 | HR 83 | Temp 98.0°F | Resp 20 | Ht 71.0 in | Wt 230.0 lb

## 2021-10-01 DIAGNOSIS — Z01818 Encounter for other preprocedural examination: Secondary | ICD-10-CM

## 2021-10-01 DIAGNOSIS — I10 Essential (primary) hypertension: Secondary | ICD-10-CM | POA: Insufficient documentation

## 2021-10-01 DIAGNOSIS — Z01811 Encounter for preprocedural respiratory examination: Secondary | ICD-10-CM | POA: Insufficient documentation

## 2021-10-01 HISTORY — DX: Cardiac arrhythmia, unspecified: I49.9

## 2021-10-01 HISTORY — DX: Presence of automatic (implantable) cardiac defibrillator: Z95.810

## 2021-10-01 LAB — SURGICAL PCR SCREEN
MRSA, PCR: NEGATIVE
Staphylococcus aureus: NEGATIVE

## 2021-10-01 LAB — CBC
HCT: 46.4 % (ref 39.0–52.0)
Hemoglobin: 15.3 g/dL (ref 13.0–17.0)
MCH: 32.3 pg (ref 26.0–34.0)
MCHC: 33 g/dL (ref 30.0–36.0)
MCV: 97.9 fL (ref 80.0–100.0)
Platelets: 249 10*3/uL (ref 150–400)
RBC: 4.74 MIL/uL (ref 4.22–5.81)
RDW: 12.7 % (ref 11.5–15.5)
WBC: 6.6 10*3/uL (ref 4.0–10.5)
nRBC: 0 % (ref 0.0–0.2)

## 2021-10-01 LAB — BASIC METABOLIC PANEL
Anion gap: 7 (ref 5–15)
BUN: 20 mg/dL (ref 8–23)
CO2: 25 mmol/L (ref 22–32)
Calcium: 9 mg/dL (ref 8.9–10.3)
Chloride: 104 mmol/L (ref 98–111)
Creatinine, Ser: 1.2 mg/dL (ref 0.61–1.24)
GFR, Estimated: >60 mL/min (ref >60)
Glucose, Bld: 239 mg/dL — ABNORMAL HIGH (ref 70–99)
Potassium: 4.2 mmol/L (ref 3.5–5.1)
Sodium: 136 mmol/L (ref 135–145)

## 2021-10-01 NOTE — Telephone Encounter (Signed)
Pt is wanting to let Dr. Graciela Husbands know that he gave him the wrong date of his wife's death.. the correct date is 2017/03/26

## 2021-10-01 NOTE — Progress Notes (Signed)
COVID test- NA   PCP - Dr. Hart Carwin Cardiologist - Dr. Wilmon Arms and Dr. Katherina Right  Chest x-ray - 10/01/21 -epic EKG - 09/29/21--epic Stress Test - 2021 ECHO - 2021 Cardiac Cath - 2005 (multi. Up to 5 stents) Pacemaker/ICD device last checked:09/29/21- orders on chart  Sleep Study - no CPAP -   Fasting Blood Sugar - NA Checks Blood Sugar _____ times a day  Blood Thinner Instructions:Eliquis and ASA/ Dr. Clide Cliff Aspirin Instructions:Stop 3 days prior to DOS/ Clide Cliff Last Dose:10/03/21  Anesthesia review: yes  Patient denies shortness of breath, fever, cough and chest pain at PAT appointment Pt has pain in both knees and doesn't do much. He lives alone and can climb 1 flight of stairs.  Patient verbalized understanding of instructions that were given to them at the PAT appointment. Patient was also instructed that they will need to review over the PAT instructions again at home before surgery. Yes

## 2021-10-02 ENCOUNTER — Encounter: Payer: Medicare PPO | Admitting: Internal Medicine

## 2021-10-02 NOTE — Progress Notes (Signed)
Anesthesia Chart Review   Case: [id number] Date/Time: 10/06/21 1020   Procedure: TOTAL KNEE ARTHROPLASTY (Left: Knee)   Anesthesia type: Spinal   Pre-op diagnosis: LEFT KNEE DEGENERATIVE JOINT DISEASE   Location: [facility] ROOM 07 / [facility]   Surgeons: Dorna Leitz, MD       DISCUSSION:81 y.o. never smoker with h/o a-fib, CAD, ischemic heart disease with AICD in place (device orders in 09/30/2021 progress note), CKD, left knee djd scheduled for above procedure 10/06/2021 with Dr. Dorna Leitz.   Pt last seen by cardiology 09/29/2021. Per OV note, "He has knee surgery upcoming.  His functional status is  hard to determine given the precluding effect of knee pain.  I think when he has things that he has to do i.e. going shopping, he is able to function at the level around 4 METS.   Have reiterated that he should hold his Eliquis for 72 hours and his NSAIDs starting today"  Anticipate pt can proceed with planned procedure barring acute status change.   VS: BP 97/62    Pulse 83    Temp 36.7 C (Oral)    Resp 20    Ht 5\' 11"  (1.803 m)    Wt 104.3 kg    SpO2 96%    BMI 32.08 kg/m   PROVIDERS: Leonard Downing, MD is PCP   Virl Axe, MD is Cardiologist  LABS: Labs reviewed: Acceptable for surgery. (all labs ordered are listed, but only abnormal results are displayed)  Labs Reviewed  BASIC METABOLIC PANEL - Abnormal; Notable for the following components:      Result Value   Glucose, Bld 239 (*)    All other components within normal limits  SURGICAL PCR SCREEN  CBC  TYPE AND SCREEN     IMAGES:   EKG: 09/29/2021 Rate 76 bpm    CV: Echo 11/23/2019 1. Left ventricular ejection fraction, by estimation, is 30 to 35%. The  left ventricle has moderately decreased function. The left ventricle  demonstrates regional wall motion abnormalities (see scoring  diagram/findings for description). The left  ventricular internal cavity size was mildly dilated. Left ventricular  diastolic  parameters are consistent with Grade II diastolic dysfunction  (pseudonormalization). Elevated left ventricular end-diastolic pressure.   2. Right ventricular systolic function is normal. The right ventricular  size is normal.   3. The mitral valve is normal in structure. Mild mitral valve  regurgitation. No evidence of mitral stenosis.   4. The aortic valve is tricuspid. Aortic valve regurgitation is mild. No  aortic stenosis is present.   5. Aortic dilatation noted. There is mild dilatation of the ascending  aorta.   6. The inferior vena cava is normal in size with greater than 50%  respiratory variability, suggesting right atrial pressure of 3 mmHg. Past Medical History:  Diagnosis Date   AICD (automatic cardioverter/defibrillator) present    Arthritis    knees, back    BPH (benign prostatic hypertrophy)    CHF (congestive heart failure) (HCC)    Chronic kidney disease    BPH   Chronic systolic heart failure ([place])    a. s/p MDT single chamber ICD 2004 as part of MASTER study b. upgrade to CRTD 2010; CRTD gen change 2016   Coronary artery disease    a. s/p anterior MI and CYPHER stent to LAD 2005   Dyslipidemia    Dysrhythmia    a-fib   Gout    Ischemic cardiomyopathy    Paroxysmal atrial fibrillation ([place])  Ventricular tachycardia     Past Surgical History:  Procedure Laterality Date   APPENDECTOMY  1970   BI-VENTRICULAR IMPLANTABLE CARDIOVERTER DEFIBRILLATOR UPGRADE N/A 11/07/2014   a. MDT single chamber ICD implanted 2004 as part of MASTER study; upgrade to CRTD 2010; gen change 2016   CARDIAC CATHETERIZATION  09/08/2003   x3 total of 5 stents   CARDIOVERSION N/A 07/04/2019   Procedure: CARDIOVERSION;  Surgeon: Josue Hector, MD;  Location: [place];  Service: Cardiovascular;  Laterality: N/A;   CARDIOVERSION N/A 08/17/2019   Procedure: CARDIOVERSION;  Surgeon: Thayer Headings, MD;  Location: [facility];  Service: Cardiovascular;  Laterality: N/A;    PROSTATECTOMY  11/06/2011   Procedure: PROSTATECTOMY SUPRAPUBIC;  Surgeon: Ailene Rud, MD;  Location: [facility];  Service: Urology;  Laterality: N/A;  Open Suprapubic Prostatectomy   TEE WITHOUT CARDIOVERSION N/A 05/16/2019   Procedure: TRANSESOPHAGEAL ECHOCARDIOGRAM (TEE);  Surgeon: Geralynn Rile, MD;  Location: [place];  Service: Cardiology;  Laterality: N/A;    MEDICATIONS:  allopurinol (ZYLOPRIM) 100 MG tablet   amiodarone (PACERONE) 200 MG tablet   aspirin EC 81 MG tablet   calcium carbonate (TUMS - DOSED IN MG ELEMENTAL CALCIUM) 500 MG chewable tablet   carvedilol (COREG) 3.125 MG tablet   ELIQUIS 5 MG TABS tablet   furosemide (LASIX) 40 MG tablet   ibuprofen (ADVIL) 200 MG tablet   latanoprost (XALATAN) 0.005 % ophthalmic solution   levothyroxine (SYNTHROID) 125 MCG tablet   Lysine 500 MG CAPS   naproxen sodium (ALEVE) 220 MG tablet   nitroGLYCERIN (NITROSTAT) 0.4 MG SL tablet   Polyethyl Glycol-Propyl Glycol 0.4-0.3 % SOLN   potassium chloride (KLOR-CON) 10 MEQ tablet   rosuvastatin (CRESTOR) 10 MG tablet   TURMERIC PO   No current facility-administered medications for this encounter.   Konrad Felix Ward, PA-C [facility] [phone]

## 2021-10-02 NOTE — Anesthesia Preprocedure Evaluation (Addendum)
Anesthesia Evaluation  Patient identified by MRN, date of birth, ID band Patient awake    Reviewed: Allergy & Precautions, NPO status , Patient's Chart, lab work & pertinent test results  Airway Mallampati: III  TM Distance: >3 FB Neck ROM: Full    Dental no notable dental hx. (+) Teeth Intact, Dental Advisory Given   Pulmonary neg pulmonary ROS,    Pulmonary exam normal breath sounds clear to auscultation       Cardiovascular hypertension, + CAD and +CHF  Normal cardiovascular exam+ dysrhythmias Atrial Fibrillation + Cardiac Defibrillator  Rhythm:Regular Rate:Normal  Hx of ischemioc cardiomyopathy  Echo 11/23/2019 1. Left ventricular ejection fraction, by estimation, is 30 to 35%. The  left ventricle has moderately decreased function. The left ventricle  demonstrates regional wall motion abnormalities (see scoring  diagram/findings for description). The left  ventricular internal cavity size was mildly dilated. Left ventricular  diastolic parameters are consistent with Grade II diastolic dysfunction  (pseudonormalization). Elevated left ventricular end-diastolic pressure.  2. Right ventricular systolic function is normal. The right ventricular  size is normal.  3. The mitral valve is normal in structure. Mild mitral valve  regurgitation. No evidence of mitral stenosis.  4. The aortic valve is tricuspid. Aortic valve regurgitation is mild. No  aortic stenosis is present.  5. Aortic dilatation noted. There is mild dilatation of the ascending  aorta.  6. The inferior vena cava is normal in size with greater than 50%  respiratory variability, suggesting right atrial pressure of 3 mmHg.   Neuro/Psych negative neurological ROS  negative psych ROS   GI/Hepatic   Endo/Other    Renal/GU Renal diseaseLab Results      Component                Value               Date                      CREATININE               1.20                 10/01/2021                BUN                      20                  10/01/2021                NA                       136                 10/01/2021                K                        4.2                 10/01/2021                CL                       104                 10/01/2021  CO2                      25                  10/01/2021                Musculoskeletal  (+) Arthritis ,   Abdominal (+) + obese (BMI 32.08),   Peds  Hematology Lab Results      Component                Value               Date                      WBC                      6.6                 10/01/2021                HGB                      15.3                10/01/2021                HCT                      46.4                10/01/2021                MCV                      97.9                10/01/2021                PLT                      249                 10/01/2021              Anesthesia Other Findings   Reproductive/Obstetrics                             Anesthesia Physical Anesthesia Plan  ASA: 4  Anesthesia Plan: Regional and Spinal   Post-op Pain Management: Minimal or no pain anticipated and Regional block   Induction:   PONV Risk Score and Plan: 2 and Treatment may vary due to age or medical condition  Airway Management Planned: Natural Airway and Nasal Cannula  Additional Equipment: None  Intra-op Plan:   Post-operative Plan:   Informed Consent: I have reviewed the patients History and Physical, chart, labs and discussed the procedure including the risks, benefits and alternatives for the proposed anesthesia with the patient or authorized representative who has indicated his/her understanding and acceptance.     Dental advisory given  Plan Discussed with: CRNA and Anesthesiologist  Anesthesia Plan Comments: (See PAT note 10/01/2021, Konrad Felix Ward, PA-C  Took Eliquis 1/26)        Anesthesia Quick Evaluation

## 2021-10-02 NOTE — Telephone Encounter (Signed)
Spoke with pt's daughter, Denyse Dago and advised of normal lab results.  Asked that she let pt know RN received the updated information re: his wife's date of death.  Pt's daughter verbalizes understanding and thanked Therapist, sports for the call.

## 2021-10-03 NOTE — Progress Notes (Signed)
Remote ICD transmission.   

## 2021-10-06 ENCOUNTER — Ambulatory Visit (HOSPITAL_COMMUNITY): Payer: Medicare PPO | Admitting: Physician Assistant

## 2021-10-06 ENCOUNTER — Observation Stay (HOSPITAL_COMMUNITY)
Admission: RE | Admit: 2021-10-06 | Discharge: 2021-10-07 | Disposition: A | Discharge status: Home Health | Payer: Medicare PPO | Source: Ambulatory Visit | Attending: Orthopedic Surgery | Admitting: Orthopedic Surgery

## 2021-10-06 ENCOUNTER — Ambulatory Visit (HOSPITAL_COMMUNITY): Payer: Medicare PPO | Admitting: Anesthesiology

## 2021-10-06 ENCOUNTER — Other Ambulatory Visit: Payer: Self-pay

## 2021-10-06 ENCOUNTER — Encounter (HOSPITAL_COMMUNITY)
Admission: RE | Disposition: A | Discharge status: Home Health | Payer: Self-pay | Source: Ambulatory Visit | Attending: Orthopedic Surgery

## 2021-10-06 ENCOUNTER — Encounter (HOSPITAL_COMMUNITY): Payer: Self-pay | Admitting: Orthopedic Surgery

## 2021-10-06 DIAGNOSIS — I480 Paroxysmal atrial fibrillation: Secondary | ICD-10-CM | POA: Diagnosis not present

## 2021-10-06 DIAGNOSIS — Z95810 Presence of automatic (implantable) cardiac defibrillator: Secondary | ICD-10-CM | POA: Insufficient documentation

## 2021-10-06 DIAGNOSIS — N189 Chronic kidney disease, unspecified: Secondary | ICD-10-CM | POA: Diagnosis not present

## 2021-10-06 DIAGNOSIS — Z7901 Long term (current) use of anticoagulants: Secondary | ICD-10-CM | POA: Insufficient documentation

## 2021-10-06 DIAGNOSIS — I5022 Chronic systolic (congestive) heart failure: Secondary | ICD-10-CM | POA: Insufficient documentation

## 2021-10-06 DIAGNOSIS — Z20822 Contact with and (suspected) exposure to covid-19: Secondary | ICD-10-CM | POA: Diagnosis not present

## 2021-10-06 DIAGNOSIS — I2510 Atherosclerotic heart disease of native coronary artery without angina pectoris: Secondary | ICD-10-CM | POA: Diagnosis not present

## 2021-10-06 DIAGNOSIS — M1712 Unilateral primary osteoarthritis, left knee: Principal | ICD-10-CM

## 2021-10-06 HISTORY — PX: TOTAL KNEE ARTHROPLASTY: SHX125

## 2021-10-06 LAB — TYPE AND SCREEN
ABO/RH(D): A POS
Antibody Screen: NEGATIVE

## 2021-10-06 LAB — RESP PANEL BY RT-PCR (FLU A&B, COVID) ARPGX2
Influenza A by PCR: NEGATIVE
Influenza B by PCR: NEGATIVE
SARS Coronavirus 2 by RT PCR: NEGATIVE

## 2021-10-06 LAB — ABO/RH: ABO/RH(D): A POS

## 2021-10-06 SURGERY — ARTHROPLASTY, KNEE, TOTAL
Anesthesia: Regional | Site: Knee | Laterality: Left

## 2021-10-06 MED ORDER — AMIODARONE HCL 100 MG PO TABS
100.0000 mg | ORAL_TABLET | Freq: Every day | ORAL | Status: DC
  Administered 2021-10-06 – 2021-10-07 (×2): 100 mg via ORAL
  Filled 2021-10-06 (×2): qty 1

## 2021-10-06 MED ORDER — FENTANYL CITRATE PF 50 MCG/ML IJ SOSY
50.0000 ug | PREFILLED_SYRINGE | INTRAMUSCULAR | Status: DC
  Administered 2021-10-06: 25 ug via INTRAVENOUS
  Filled 2021-10-06: qty 2

## 2021-10-06 MED ORDER — SODIUM CHLORIDE 0.9 % IR SOLN
Status: DC | PRN
  Administered 2021-10-06: 1000 mL

## 2021-10-06 MED ORDER — METOCLOPRAMIDE HCL 5 MG PO TABS: 5.0000 mg | ORAL_TABLET | Freq: Three times a day (TID) | ORAL | Status: DC | PRN

## 2021-10-06 MED ORDER — DEXAMETHASONE SODIUM PHOSPHATE 10 MG/ML IJ SOLN
INTRAMUSCULAR | Status: AC
  Filled 2021-10-06: qty 1

## 2021-10-06 MED ORDER — BUPIVACAINE-EPINEPHRINE 0.5% -1:200000 IJ SOLN
INTRAMUSCULAR | Status: DC | PRN
  Administered 2021-10-06: 30 mL

## 2021-10-06 MED ORDER — TRANEXAMIC ACID-NACL 1000-0.7 MG/100ML-% IV SOLN
1000.0000 mg | Freq: Once | INTRAVENOUS | Status: AC
  Administered 2021-10-06: 1000 mg via INTRAVENOUS
  Filled 2021-10-06: qty 100

## 2021-10-06 MED ORDER — ALUM & MAG HYDROXIDE-SIMETH 200-200-20 MG/5ML PO SUSP: 30.0000 mL | ORAL | Status: DC | PRN

## 2021-10-06 MED ORDER — PHENYLEPHRINE 40 MCG/ML (10ML) SYRINGE FOR IV PUSH (FOR BLOOD PRESSURE SUPPORT)
PREFILLED_SYRINGE | INTRAVENOUS | Status: AC
  Filled 2021-10-06: qty 10

## 2021-10-06 MED ORDER — BISACODYL 5 MG PO TBEC: 5.0000 mg | DELAYED_RELEASE_TABLET | Freq: Every day | ORAL | Status: DC | PRN

## 2021-10-06 MED ORDER — DEXAMETHASONE SODIUM PHOSPHATE 10 MG/ML IJ SOLN
10.0000 mg | Freq: Two times a day (BID) | INTRAMUSCULAR | Status: DC
  Administered 2021-10-06 – 2021-10-07 (×2): 10 mg via INTRAVENOUS
  Filled 2021-10-06 (×2): qty 1

## 2021-10-06 MED ORDER — PROPOFOL 1000 MG/100ML IV EMUL
INTRAVENOUS | Status: AC
  Filled 2021-10-06: qty 100

## 2021-10-06 MED ORDER — ROPIVACAINE HCL 5 MG/ML IJ SOLN
INTRAMUSCULAR | Status: DC | PRN
Start: 2021-10-06 — End: 2021-10-06
  Administered 2021-10-06: 30 mL via PERINEURAL

## 2021-10-06 MED ORDER — PROPOFOL 500 MG/50ML IV EMUL
INTRAVENOUS | Status: DC | PRN
  Administered 2021-10-06: 60 ug/kg/min via INTRAVENOUS

## 2021-10-06 MED ORDER — FUROSEMIDE 40 MG PO TABS
40.0000 mg | ORAL_TABLET | Freq: Every day | ORAL | Status: DC
Start: 2021-10-06 — End: 2021-10-07
  Administered 2021-10-06 – 2021-10-07 (×2): 40 mg via ORAL
  Filled 2021-10-06 (×2): qty 1

## 2021-10-06 MED ORDER — LACTATED RINGERS IV SOLN: INTRAVENOUS | Status: DC

## 2021-10-06 MED ORDER — ONDANSETRON HCL 4 MG/2ML IJ SOLN: 4.0000 mg | Freq: Once | INTRAMUSCULAR | Status: DC | PRN

## 2021-10-06 MED ORDER — METHOCARBAMOL 500 MG PO TABS
500.0000 mg | ORAL_TABLET | Freq: Four times a day (QID) | ORAL | Status: DC | PRN
  Administered 2021-10-06: 500 mg via ORAL
  Filled 2021-10-06: qty 1

## 2021-10-06 MED ORDER — DOCUSATE SODIUM 100 MG PO CAPS
100.0000 mg | ORAL_CAPSULE | Freq: Two times a day (BID) | ORAL | Status: DC
  Administered 2021-10-06 – 2021-10-07 (×2): 100 mg via ORAL
  Filled 2021-10-06 (×2): qty 1

## 2021-10-06 MED ORDER — BUPIVACAINE LIPOSOME 1.3 % IJ SUSP
INTRAMUSCULAR | Status: DC | PRN
  Administered 2021-10-06: 30 mL

## 2021-10-06 MED ORDER — HYDROCODONE-ACETAMINOPHEN 5-325 MG PO TABS
1.0000 | ORAL_TABLET | ORAL | Status: DC | PRN
  Administered 2021-10-06 (×2): 1 via ORAL
  Administered 2021-10-07: 2 via ORAL
  Filled 2021-10-06: qty 1
  Filled 2021-10-06 (×2): qty 2

## 2021-10-06 MED ORDER — CHLORHEXIDINE GLUCONATE 0.12 % MT SOLN
15.0000 mL | Freq: Once | OROMUCOSAL | Status: AC
  Administered 2021-10-06: 15 mL via OROMUCOSAL

## 2021-10-06 MED ORDER — DEXAMETHASONE SODIUM PHOSPHATE 10 MG/ML IJ SOLN
INTRAMUSCULAR | Status: DC | PRN
  Administered 2021-10-06: 4 mg via INTRAVENOUS

## 2021-10-06 MED ORDER — FENTANYL CITRATE PF 50 MCG/ML IJ SOSY
PREFILLED_SYRINGE | INTRAMUSCULAR | Status: AC
  Administered 2021-10-06: 25 ug via INTRAVENOUS
  Filled 2021-10-06: qty 1

## 2021-10-06 MED ORDER — METHOCARBAMOL 500 MG IVPB - SIMPLE MED
500.0000 mg | Freq: Four times a day (QID) | INTRAVENOUS | Status: DC | PRN
  Filled 2021-10-06: qty 50

## 2021-10-06 MED ORDER — POLYETHYLENE GLYCOL 3350 17 G PO PACK: 17.0000 g | PACK | Freq: Every day | ORAL | Status: DC | PRN

## 2021-10-06 MED ORDER — FENTANYL CITRATE PF 50 MCG/ML IJ SOSY
25.0000 ug | PREFILLED_SYRINGE | INTRAMUSCULAR | Status: DC | PRN
  Administered 2021-10-06 (×3): 25 ug via INTRAVENOUS

## 2021-10-06 MED ORDER — METHOCARBAMOL 500 MG IVPB - SIMPLE MED
INTRAVENOUS | Status: AC
  Administered 2021-10-06: 500 mg via INTRAVENOUS
  Filled 2021-10-06: qty 50

## 2021-10-06 MED ORDER — MORPHINE SULFATE (PF) 2 MG/ML IV SOLN
0.5000 mg | INTRAVENOUS | Status: DC | PRN
  Administered 2021-10-07: 1 mg via INTRAVENOUS
  Filled 2021-10-06: qty 1

## 2021-10-06 MED ORDER — CLONIDINE HCL (ANALGESIA) 100 MCG/ML EP SOLN
EPIDURAL | Status: DC | PRN
  Administered 2021-10-06: 100 ug

## 2021-10-06 MED ORDER — APIXABAN 5 MG PO TABS
5.0000 mg | ORAL_TABLET | Freq: Two times a day (BID) | ORAL | Status: DC
  Administered 2021-10-07: 5 mg via ORAL
  Filled 2021-10-06: qty 1

## 2021-10-06 MED ORDER — HYDROCODONE-ACETAMINOPHEN 5-325 MG PO TABS: 1.0000 | ORAL_TABLET | Freq: Four times a day (QID) | ORAL | 0 refills | Status: DC | PRN

## 2021-10-06 MED ORDER — LEVOTHYROXINE SODIUM 125 MCG PO TABS
125.0000 ug | ORAL_TABLET | Freq: Every day | ORAL | Status: DC
  Administered 2021-10-07: 125 ug via ORAL
  Filled 2021-10-06: qty 1

## 2021-10-06 MED ORDER — SODIUM CHLORIDE 0.9% FLUSH
INTRAVENOUS | Status: DC | PRN
  Administered 2021-10-06: 50 mL

## 2021-10-06 MED ORDER — PHENYLEPHRINE 40 MCG/ML (10ML) SYRINGE FOR IV PUSH (FOR BLOOD PRESSURE SUPPORT)
PREFILLED_SYRINGE | INTRAVENOUS | Status: DC | PRN
  Administered 2021-10-06: 80 ug via INTRAVENOUS
  Administered 2021-10-06: 40 ug via INTRAVENOUS
  Administered 2021-10-06 (×3): 80 ug via INTRAVENOUS
  Administered 2021-10-06: 40 ug via INTRAVENOUS

## 2021-10-06 MED ORDER — ONDANSETRON HCL 4 MG/2ML IJ SOLN
INTRAMUSCULAR | Status: DC | PRN
  Administered 2021-10-06: 4 mg via INTRAVENOUS

## 2021-10-06 MED ORDER — ONDANSETRON HCL 4 MG/2ML IJ SOLN
INTRAMUSCULAR | Status: AC
  Filled 2021-10-06: qty 2

## 2021-10-06 MED ORDER — CEFAZOLIN SODIUM-DEXTROSE 2-4 GM/100ML-% IV SOLN
2.0000 g | Freq: Four times a day (QID) | INTRAVENOUS | Status: AC
  Administered 2021-10-06 (×2): 2 g via INTRAVENOUS
  Filled 2021-10-06 (×2): qty 100

## 2021-10-06 MED ORDER — ZOLPIDEM TARTRATE 5 MG PO TABS: 5.0000 mg | ORAL_TABLET | Freq: Every evening | ORAL | Status: DC | PRN

## 2021-10-06 MED ORDER — 0.9 % SODIUM CHLORIDE (POUR BTL) OPTIME
TOPICAL | Status: DC | PRN
  Administered 2021-10-06: 1000 mL

## 2021-10-06 MED ORDER — ONDANSETRON HCL 4 MG/2ML IJ SOLN
4.0000 mg | Freq: Four times a day (QID) | INTRAMUSCULAR | Status: DC | PRN
  Administered 2021-10-07: 4 mg via INTRAVENOUS
  Filled 2021-10-06: qty 2

## 2021-10-06 MED ORDER — CARVEDILOL 3.125 MG PO TABS
3.1250 mg | ORAL_TABLET | Freq: Two times a day (BID) | ORAL | Status: DC
  Administered 2021-10-06 – 2021-10-07 (×2): 3.125 mg via ORAL
  Filled 2021-10-06 (×2): qty 1

## 2021-10-06 MED ORDER — SODIUM CHLORIDE 0.9 % IV SOLN: INTRAVENOUS | Status: DC

## 2021-10-06 MED ORDER — ACETAMINOPHEN 10 MG/ML IV SOLN: 1000.0000 mg | Freq: Once | INTRAVENOUS | Status: DC | PRN

## 2021-10-06 MED ORDER — WATER FOR IRRIGATION, STERILE IR SOLN
Status: DC | PRN
  Administered 2021-10-06: 2000 mL

## 2021-10-06 MED ORDER — DIPHENHYDRAMINE HCL 12.5 MG/5ML PO ELIX: 12.5000 mg | ORAL_SOLUTION | ORAL | Status: DC | PRN

## 2021-10-06 MED ORDER — POTASSIUM CHLORIDE ER 10 MEQ PO TBCR
10.0000 meq | EXTENDED_RELEASE_TABLET | ORAL | Status: DC
  Administered 2021-10-06: 10 meq via ORAL
  Filled 2021-10-06 (×2): qty 1

## 2021-10-06 MED ORDER — ONDANSETRON HCL 4 MG PO TABS: 4.0000 mg | ORAL_TABLET | Freq: Four times a day (QID) | ORAL | Status: DC | PRN

## 2021-10-06 MED ORDER — LATANOPROST 0.005 % OP SOLN
1.0000 [drp] | Freq: Every day | OPHTHALMIC | Status: DC
  Administered 2021-10-06: 1 [drp] via OPHTHALMIC
  Filled 2021-10-06: qty 2.5

## 2021-10-06 MED ORDER — LIDOCAINE HCL (PF) 2 % IJ SOLN
INTRAMUSCULAR | Status: AC
  Filled 2021-10-06: qty 5

## 2021-10-06 MED ORDER — TIZANIDINE HCL 2 MG PO TABS: 2.0000 mg | ORAL_TABLET | Freq: Three times a day (TID) | ORAL | 0 refills | Status: DC | PRN

## 2021-10-06 MED ORDER — ORAL CARE MOUTH RINSE: 15.0000 mL | Freq: Once | OROMUCOSAL | Status: AC

## 2021-10-06 MED ORDER — BUPIVACAINE LIPOSOME 1.3 % IJ SUSP
INTRAMUSCULAR | Status: AC
  Filled 2021-10-06: qty 20

## 2021-10-06 MED ORDER — PROPOFOL 10 MG/ML IV BOLUS
INTRAVENOUS | Status: AC
  Filled 2021-10-06: qty 20

## 2021-10-06 MED ORDER — PROPOFOL 10 MG/ML IV BOLUS
INTRAVENOUS | Status: DC | PRN
  Administered 2021-10-06 (×2): 20 mg via INTRAVENOUS

## 2021-10-06 MED ORDER — NITROGLYCERIN 0.4 MG SL SUBL: 0.4000 mg | SUBLINGUAL_TABLET | SUBLINGUAL | Status: DC | PRN

## 2021-10-06 MED ORDER — LIDOCAINE 2% (20 MG/ML) 5 ML SYRINGE
INTRAMUSCULAR | Status: DC | PRN
  Administered 2021-10-06: 20 mg via INTRAVENOUS

## 2021-10-06 MED ORDER — METOCLOPRAMIDE HCL 5 MG/ML IJ SOLN: 5.0000 mg | Freq: Three times a day (TID) | INTRAMUSCULAR | Status: DC | PRN

## 2021-10-06 MED ORDER — BUPIVACAINE IN DEXTROSE 0.75-8.25 % IT SOLN
INTRATHECAL | Status: DC | PRN
  Administered 2021-10-06: 1.8 mL via INTRATHECAL

## 2021-10-06 MED ORDER — TRANEXAMIC ACID-NACL 1000-0.7 MG/100ML-% IV SOLN
1000.0000 mg | INTRAVENOUS | Status: AC
  Administered 2021-10-06: 1000 mg via INTRAVENOUS
  Filled 2021-10-06: qty 100

## 2021-10-06 MED ORDER — FENTANYL CITRATE PF 50 MCG/ML IJ SOSY
PREFILLED_SYRINGE | INTRAMUSCULAR | Status: AC
  Administered 2021-10-06: 25 ug via INTRAVENOUS
  Filled 2021-10-06: qty 2

## 2021-10-06 MED ORDER — CEFAZOLIN SODIUM-DEXTROSE 2-4 GM/100ML-% IV SOLN
2.0000 g | INTRAVENOUS | Status: AC
  Administered 2021-10-06: 2 g via INTRAVENOUS
  Filled 2021-10-06: qty 100

## 2021-10-06 MED ORDER — BUPIVACAINE-EPINEPHRINE (PF) 0.25% -1:200000 IJ SOLN
INTRAMUSCULAR | Status: AC
  Filled 2021-10-06: qty 30

## 2021-10-06 MED ORDER — ACETAMINOPHEN 325 MG PO TABS: 325.0000 mg | ORAL_TABLET | Freq: Four times a day (QID) | ORAL | Status: DC | PRN

## 2021-10-06 SURGICAL SUPPLY — 50 items
ATTUNE MED DOME PAT 41 KNEE (Knees) ×1 IMPLANT
ATTUNE PS FEM LT SZ 7 CEM KNEE (Femur) ×1 IMPLANT
ATTUNE PSRP INSR SZ7 5 KNEE (Insert) ×1 IMPLANT
BAG COUNTER SPONGE SURGICOUNT (BAG) ×1 IMPLANT
BAG ZIPLOCK 12X15 (MISCELLANEOUS) ×2 IMPLANT
BASE TIBIAL ROT PLAT SZ 8 KNEE (Knees) IMPLANT
BENZOIN TINCTURE PRP APPL 2/3 (GAUZE/BANDAGES/DRESSINGS) ×2 IMPLANT
BLADE SAGITTAL 25.0X1.19X90 (BLADE) ×2 IMPLANT
BLADE SAW SGTL 13.0X1.19X90.0M (BLADE) ×2 IMPLANT
BLADE SURG SZ10 CARB STEEL (BLADE) ×4 IMPLANT
BNDG ELASTIC 6X5.8 VLCR STR LF (GAUZE/BANDAGES/DRESSINGS) ×2 IMPLANT
BOOTIES KNEE HIGH SLOAN (MISCELLANEOUS) ×2 IMPLANT
BOWL SMART MIX CTS (DISPOSABLE) ×2 IMPLANT
CEMENT HV SMART SET (Cement) ×4 IMPLANT
CLSR STERI-STRIP ANTIMIC 1/2X4 (GAUZE/BANDAGES/DRESSINGS) ×1 IMPLANT
COVER SURGICAL LIGHT HANDLE (MISCELLANEOUS) ×2 IMPLANT
CUFF TOURN SGL QUICK 34 (TOURNIQUET CUFF) ×2
CUFF TRNQT CYL 34X4.125X (TOURNIQUET CUFF) ×1 IMPLANT
DECANTER SPIKE VIAL GLASS SM (MISCELLANEOUS) ×4 IMPLANT
DRAPE INCISE IOBAN 66X45 STRL (DRAPES) ×2 IMPLANT
DRAPE U-SHAPE 47X51 STRL (DRAPES) ×2 IMPLANT
DRSG AQUACEL AG ADV 3.5X10 (GAUZE/BANDAGES/DRESSINGS) ×2 IMPLANT
DURAPREP 26ML APPLICATOR (WOUND CARE) ×2 IMPLANT
ELECT REM PT RETURN 15FT ADLT (MISCELLANEOUS) ×2 IMPLANT
GLOVE SRG 8 PF TXTR STRL LF DI (GLOVE) ×2 IMPLANT
GLOVE SURG NEOP MICRO LF SZ7.5 (GLOVE) ×4 IMPLANT
GLOVE SURG UNDER POLY LF SZ8 (GLOVE) ×4
GOWN STRL REUS W/TWL XL LVL3 (GOWN DISPOSABLE) ×4 IMPLANT
HANDPIECE INTERPULSE COAX TIP (DISPOSABLE) ×2
HOLDER FOLEY CATH W/STRAP (MISCELLANEOUS) ×1 IMPLANT
HOOD PEEL AWAY FLYTE STAYCOOL (MISCELLANEOUS) ×6 IMPLANT
KIT TURNOVER KIT A (KITS) ×1 IMPLANT
MANIFOLD NEPTUNE II (INSTRUMENTS) ×2 IMPLANT
NEEDLE HYPO 22GX1.5 SAFETY (NEEDLE) ×4 IMPLANT
NS IRRIG 1000ML POUR BTL (IV SOLUTION) ×2 IMPLANT
PACK TOTAL KNEE CUSTOM (KITS) ×2 IMPLANT
PADDING CAST COTTON 6X4 STRL (CAST SUPPLIES) ×2 IMPLANT
PROTECTOR NERVE ULNAR (MISCELLANEOUS) ×2 IMPLANT
SET HNDPC FAN SPRY TIP SCT (DISPOSABLE) ×1 IMPLANT
SPONGE T-LAP 18X18 ~~LOC~~+RFID (SPONGE) ×6 IMPLANT
SPONGE T-LAP 4X18 ~~LOC~~+RFID (SPONGE) IMPLANT
STRIP CLOSURE SKIN 1/2X4 (GAUZE/BANDAGES/DRESSINGS) IMPLANT
SUT MNCRL AB 3-0 PS2 18 (SUTURE) ×2 IMPLANT
SUT VIC AB 0 CT1 36 (SUTURE) ×2 IMPLANT
SUT VIC AB 1 CT1 36 (SUTURE) ×4 IMPLANT
SYR CONTROL 10ML LL (SYRINGE) ×4 IMPLANT
TIBIAL BASE ROT PLAT SZ 8 KNEE (Knees) ×2 IMPLANT
TRAY FOLEY MTR SLVR 16FR STAT (SET/KITS/TRAYS/PACK) ×2 IMPLANT
WATER STERILE IRR 1000ML POUR (IV SOLUTION) ×4 IMPLANT
WRAP KNEE MAXI GEL POST OP (GAUZE/BANDAGES/DRESSINGS) ×2 IMPLANT

## 2021-10-06 NOTE — Progress Notes (Signed)
AssistedDr. Houser with left, ultrasound guided, adductor canal block. Side rails up, monitors on throughout procedure. See vital signs in flow sheet. Tolerated Procedure well.  

## 2021-10-06 NOTE — Anesthesia Postprocedure Evaluation (Signed)
Anesthesia Post Note  Patient: [name]  Procedure(s) Performed: TOTAL KNEE ARTHROPLASTY (Left: Knee)     Patient location during evaluation: Nursing Unit Anesthesia Type: Regional and Spinal Level of consciousness: oriented and awake and alert Pain management: pain level controlled Vital Signs Assessment: post-procedure vital signs reviewed and stable Respiratory status: spontaneous breathing and respiratory function stable Cardiovascular status: blood pressure returned to baseline and stable Postop Assessment: no headache, no backache, no apparent nausea or vomiting and patient able to bend at knees Anesthetic complications: no   No notable events documented.  Last Vitals:  Vitals:   10/06/21 1515 10/06/21 1530  BP: 137/74 139/71  Pulse: 60 60  Resp: 17 15  Temp:    SpO2: 96% 97%    Last Pain:  Vitals:   10/06/21 1530  TempSrc:   PainSc: [place]

## 2021-10-06 NOTE — Op Note (Signed)
PATIENT ID:      [name]  MRN:     [id number] DOB/AGE:    March 10, 1941 / 81 y.o.       OPERATIVE REPORT   DATE OF PROCEDURE:  10/06/2021      PREOPERATIVE DIAGNOSIS:   LEFT KNEE DEGENERATIVE JOINT DISEASE      Estimated body mass index is 32.08 kg/m as calculated from the following:   Height as of 10/01/21: 5\' 11"  (1.803 m).   Weight as of 10/01/21: 104.3 kg.                                                       POSTOPERATIVE DIAGNOSIS:   Same                                                                  PROCEDURE:  Procedure(s): TOTAL KNEE ARTHROPLASTY Using DepuyAttune RP implants #7 Femur, #8Tibia, 5 mm Attune RP bearing, 41 Patella    SURGEON: 10/03/21  ASSISTANT:   jim Bethune PA-C   (Present and scrubbed throughout the case, critical for assistance with exposure, retraction, instrumentation, and closure.)        ANESTHESIA: spinal, 20cc Exparel, 50cc 0.25% Marcaine EBL: min cc FLUID REPLACEMENT: unk cc crystaloid TOURNIQUET: DRAINS: None TRANEXAMIC ACID: 1gm IV, 2gm topical COMPLICATIONS:  None         INDICATIONS FOR PROCEDURE: The patient has  LEFT KNEE DEGENERATIVE JOINT DISEASE, mild varus deformities, XR shows bone on bone arthritis, lateral subluxation of tibia. Patient has failed all conservative measures including anti-inflammatory medicines, narcotics, attempts at exercise and weight loss, cortisone injections and viscosupplementation.  Risks and benefits of surgery have been discussed, questions answered.   DESCRIPTION OF PROCEDURE: The patient identified by armband, received  IV antibiotics, in the holding area at [facility]. Patient taken to the operating room, appropriate anesthetic monitors were attached, and spinal anesthesia was  induced. IV Tranexamic acid was given.Tourniquet applied high to the operative thigh. Lateral post and foot positioner applied to the table, the lower extremity was then prepped and draped in usual sterile fashion from  the toes to the tourniquet. Time-out procedure was performed. [facility] PAC, was present and scrubbed throughout the case, critical for assistance with, positioning, exposure, retraction, instrumentation, and closure.The skin and subcutaneous tissue along the incision was injected with 20 cc of a mixture of Exparel and Marcaine solution, using a 20-gauge by 1-1/2 inch needle. We began the operation, with the knee flexed 130 degrees, by making the anterior midline incision starting at handbreadth above the patella going over the patella 1 cm medial to and 4 cm distal to the tibial tubercle. Small bleeders in the skin and the subcutaneous tissue identified and cauterized. Transverse retinaculum was incised and reflected medially and a medial parapatellar arthrotomy was accomplished. the patella was everted and theprepatellar fat pad resected. The superficial medial collateral ligament was then elevated from anterior to posterior along the proximal flare of the tibia and anterior half of the menisci resected. The knee was hyperflexed exposing bone on bone arthritis. Peripheral  and notch osteophytes as well as the cruciate ligaments were then resected. We continued to work our way around posteriorly along the proximal tibia, and externally rotated the tibia subluxing it out from underneath the femur. A McHale PCL retractor was placed through the notch and a lateral Hohmann retractor placed, and we then entered the proximal tibia in line with the Depuy starter drill in line with the axis of the tibia followed by an intramedullary guide rod and 0-degree posterior slope cutting guide. The tibial cutting guide, 4 degree posterior sloped, was pinned into place allowing resection of 2 mm of bone medially and 10 mm of bone laterally. Satisfied with the tibial resection, we then entered the distal femur 2 mm anterior to the PCL origin with the intramedullary guide rod and applied the distal femoral cutting guide set at 9 mm,  with 5 degrees of valgus. This was pinned along the epicondylar axis. At this point, the distal femoral cut was accomplished without difficulty. We then sized for a #7 femoral component and pinned the guide in 3 degrees of external rotation. The chamfer cutting guide was pinned into place. The anterior, posterior, and chamfer cuts were accomplished without difficulty followed by the Attune RP box cutting guide and the box cut. We also removed posterior osteophytes from the posterior femoral condyles. The posterior capsule was injected with Exparel solution. The knee was brought into full extension. We checked our extension gap and fit a 5 mm bearing. Distracting in extension with a lamina spreader,  bleeders in the posterior capsule, Posterior medial and posterior lateral gutter were cauterized.  The transexamic acid-soaked sponge was then placed in the gap of the knee in extension. The knee was flexed 30. The posterior patella cut was accomplished with the 9.5 mm Attune cutting guide, sized for a 63mm dome, and the fixation pegs drilled.The knee was then once again hyperflexed exposing the proximal tibia. We sized for a # 8 tibial base plate, applied the smokestack and the conical reamer followed by the the Delta fin keel punch. We then hammered into place the Attune RP trial femoral component, drilled the lugs, inserted a  5 mm trial bearing, trial patellar button, and took the knee through range of motion from 0-130 degrees. Medial and lateral ligamentous stability was checked. No thumb pressure was required for patellar Tracking. The tourniquet was released at 45 min. All trial components were removed, mating surfaces irrigated with pulse lavage, and dried with suction and sponges. 10 cc of the Exparel solution was applied to the cancellus bone of the patella distal femur and proximal tibia.  After waiting 30 seconds, the bony surfaces were again, dried with sponges. A double batch of DePuy HV cement was mixed  and applied to all bony metallic mating surfaces except for the posterior condyles of the femur itself. In order, we hammered into place the tibial tray and removed excess cement, the femoral component and removed excess cement. The final Attune RP bearing was inserted, and the knee brought to full extension with compression. The patellar button was clamped into place, and excess cement removed. The knee was held at 30 flexion with compression, while the cement cured. The wound was irrigated out with normal saline solution pulse lavage. The rest of the Exparel was injected into the parapatellar arthrotomy, subcutaneous tissues, and periosteal tissues. The parapatellar arthrotomy was closed with running #1 Vicryl suture. The subcutaneous tissue with 3-0 undyed Vicryl suture, and the skin with running 3-0 SQ vicryl. An  Aquacil and Ace wrap were applied. The patient was taken to recovery room without difficulty.   James JuniorJohn L Esmerelda [name] 10/06/2021, 1:54 PM

## 2021-10-06 NOTE — H&P (Signed)
TOTAL KNEE ADMISSION H&P  Patient is being admitted for left total knee arthroplasty.  Subjective:  Chief Complaint:left knee pain.  HPI: [name], 81 y.o. male, has a history of pain and functional disability in the left knee due to arthritis and has failed non-surgical conservative treatments for greater than 12 weeks to includeNSAID's and/or analgesics, corticosteriod injections, viscosupplementation injections, flexibility and strengthening excercises, weight reduction as appropriate, and activity modification.  Onset of symptoms was gradual, starting 5 years ago with gradually worsening course since that time. The patient noted no past surgery on the left knee(s).  Patient currently rates pain in the left knee(s) at 6 out of 10 with activity. Patient has night pain, worsening of pain with activity and weight bearing, pain that interferes with activities of daily living, and pain with passive range of motion.  Patient has evidence of subchondral cysts, subchondral sclerosis, and joint space narrowing by imaging studies. This patient has had failure of previous osteotomy, distal femur fracture, and failure of all reasonable conservative care . There is no active infection.  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 s/p MDT single chamber ICD 2004 as part of MASTER study b. upgrade to CRTD 2010; CRTD gen change 2016   Coronary artery disease    a. s/p anterior MI and CYPHER stent to LAD 2005   Dyslipidemia    Dysrhythmia    a-fib   Gout    Ischemic cardiomyopathy    Paroxysmal atrial fibrillation ([place])    Ventricular tachycardia     Past Surgical History:  Procedure Laterality Date   APPENDECTOMY  1970   BI-VENTRICULAR IMPLANTABLE CARDIOVERTER DEFIBRILLATOR UPGRADE N/A 11/07/2014   a. MDT single chamber ICD implanted 2004 as part of MASTER study; upgrade to CRTD 2010; gen change 2016   CARDIAC CATHETERIZATION  09/08/2003   x3 total of 5 stents   CARDIOVERSION N/A 07/04/2019   Procedure: CARDIOVERSION;  Surgeon: Josue Hector, MD;  Location: [facility];  Service: Cardiovascular;  Laterality: N/A;   CARDIOVERSION N/A 08/17/2019   Procedure: CARDIOVERSION;  Surgeon: Thayer Headings, MD;  Location[facility] ENDOSCOPY;  Service: Cardiovascular;  Laterality: N/A;   PROSTATECTOMY  11/06/2011   Procedure: PROSTATECTOMY SUPRAPUBIC;  Surgeon: Ailene Rud, MD;  Location: [facility];  Service: Urology;  Laterality: N/A;  Open Suprapubic Prostatectomy   TEE WITHOUT CARDIOVERSION N/A 05/16/2019   Procedure: TRANSESOPHAGEAL ECHOCARDIOGRAM (TEE);  Surgeon: Geralynn Rile, MD;  Location: [facility];  Service: Cardiology;  Laterality: N/A;    Current Facility-Administered Medications  Medication Dose Route Frequency Provider Last Rate Last Admin   ceFAZolin (ANCEF) IVPB 2g/100 mL premix  2 g Intravenous On Call to OR Dorna Leitz, MD       fentaNYL (SUBLIMAZE) injection 50-100 mcg  50-100 mcg Intravenous UD Barnet Glasgow, MD   25 mcg at 10/06/21 1110   lactated ringers infusion   Intravenous Continuous Janeece Riggers, MD 10 mL/hr at 10/06/21 1050 Continued from Pre-op at 10/06/21 1050   tranexamic acid (CYKLOKAPRON) IVPB 1,000 mg  1,000 mg Intravenous To OR Dorna Leitz, MD       Allergies   Allergen Reactions   Atorvastatin Other (See Comments)    MUSCLE ACHE     Social History   Tobacco Use   Smoking status: Never   Smokeless tobacco: Never  Substance Use Topics   Alcohol use: No    Comment: occasional beer     Family History  Problem Relation Age of Onset   Heart disease Sister        3 64f the 4 had HD   Heart disease Brother        2 of 3 had HD   Heart disease Sister        1 of the 6 has HD   Heart disease Brother      Review of Systems ROS: I have reviewed the patient's review of systems thoroughly and there are no positive responses as relates to the HPI.  Objective:  Physical Exam  Vital signs in last 24 hours: Temp:  [97.5 F (36.4 C)] 97.5 F (36.4 C) (01/30 0830) Pulse Rate:  [59-65] 59 (01/30 1115) Resp:  [15-19] 19 (01/30 1115) BP: (147-162)/(74-88) 162/88 (01/30 1115) SpO2:  [98 %] 98 % (01/30 1115) Well-developed well-nourished patient in no acute distress. Alert and oriented x3 HEENT:within normal limits Cardiac: Regular rate and rhythm Pulmonary: Lungs clear to auscultation Abdomen: Soft and nontender.  Normal active bowel sounds  Musculoskeletal: (Left knee: Painful range of motion.  Limited range of motion.  No instability.  Trace effusion.  No erythema or warmth. Labs: Recent Results (from the past 2160 hour(s))  CUP PACEART REMOTE DEVICE CHECK     Status: None   Collection Time: 07/22/21  2:10 PM  Result Value Ref Range   Date Time Interrogation Session [id number]    Pulse Generator Manufacturer MERM    Pulse Gen Model DTBA1D1 Viva XT CRT-D    Pulse Gen Serial Number [id number]    Clinic Name [facility]    Implantable Pulse Generator Type Cardiac Resynch Therapy Defibulator    Implantable Pulse Generator Implant Date BQ:8430484    Implantable Lead Manufacturer [facility]    Implantable Lead Model 4196 Attain Ability    Implantable Lead Serial Number [id number]    Implantable Lead Implant Date WK:9005716    Implantable  Lead Location Detail 1 Cardiac Vein (Left)    Implantable Lead Location [id number]    Implantable Lead Manufacturer [facility]    Implantable Lead Model 5076 CapSureFix Novus    Implantable Lead Serial Number [id number]    Implantable Lead Implant Date WK:9005716    Implantable Lead Location Detail 1 APPENDAGE    Implantable Lead Location [id number]    Implantable Lead Manufacturer [facility]    Implantable Lead Model [id number] Sprint Quattro Secure    Implantable Lead Serial Number [id number]    Implantable Lead Implant Date RX:2452613    Implantable Lead Location Detail 1 APEX    Implantable Lead Special Function      Diagnosis:  Ischemic heart disease w/bronchial arrest   Implantable Lead Location [phone]    Lead Channel Setting Sensing Sensitivity 0.3 mV   Lead Channel Setting Pacing Amplitude 1.5 V   Lead Channel Setting  Pacing Pulse Width 0.4 ms   Lead Channel Setting Pacing Amplitude 1.5 V   Lead Channel Setting Pacing Pulse Width 0.6 ms   Lead Channel Setting Pacing Amplitude 2.25 V   Lead Channel Setting Pacing Capture Mode Fixed Pacing    Lead Channel Impedance Value 456 ohm   Lead Channel Sensing Intrinsic Amplitude 2 mV   Lead Channel Sensing Intrinsic Amplitude 2 mV   Lead Channel Pacing Threshold Amplitude 0.625 V   Lead Channel Pacing Threshold Pulse Width 0.4 ms   Lead Channel Impedance Value 418 ohm   Lead Channel Impedance Value 342 ohm   Lead Channel Sensing Intrinsic Amplitude 16 mV   Lead Channel Sensing Intrinsic Amplitude 16 mV   Lead Channel Pacing Threshold Amplitude 0.625 V   Lead Channel Pacing Threshold Pulse Width 0.4 ms   HighPow Impedance 47 ohm   HighPow Impedance 63 ohm   Lead Channel Impedance Value 874 ohm   Lead Channel Impedance Value 551 ohm   Lead Channel Impedance Value 418 ohm   Battery Status OK    Battery Remaining Longevity 3 mo   Battery Voltage 2.78 V   Brady Statistic RA Percent Paced 99.68 %   Brady Statistic RV Percent Paced 99.66 %   Brady Statistic AP  VP Percent 99.36 %   Brady Statistic AS VP Percent 0.31 %   Brady Statistic AP VS Percent 0.33 %   Brady Statistic AS VS Percent 0 %  CUP PACEART REMOTE DEVICE CHECK     Status: None   Collection Time: 08/22/21  1:15 PM  Result Value Ref Range   Date Time Interrogation Session [phone]    Pulse Generator Manufacturer MERM    Pulse Gen Model DTBA1D1 Viva XT CRT-D    Pulse Gen Serial Number [id number]    Clinic Name [facility]    Implantable Pulse Generator Type Cardiac Resynch Therapy Defibulator    Implantable Pulse Generator Implant Date BQ:8430484    Implantable Lead Manufacturer MERM    Implantable Lead Model 4196 Attain Ability    Implantable Lead Serial Number [id number]    Implantable Lead Implant Date WK:9005716    Implantable Lead Location Detail 1 Cardiac Vein (Left)    Implantable Lead Location [id number]    Implantable Lead Manufacturer [facility]    Implantable Lead Model 5076 CapSureFix Novus    Implantable Lead Serial Number [id number]    Implantable Lead Implant Date WK:9005716    Implantable Lead Location Detail 1 APPENDAGE    Implantable Lead Location [id number]    Implantable Lead Manufacturer [facility]    Implantable Lead Model [phone] Sprint Quattro Secure    Implantable Lead Serial Number [id number]    Implantable Lead Implant Date RX:2452613    Implantable Lead Location Detail 1 APEX    Implantable Lead Special Function      Diagnosis:  Ischemic heart disease w/bronchial arrest   Implantable Lead Location [id number]    Lead Channel Setting Sensing Sensitivity 0.3 mV   Lead Channel Setting Pacing Amplitude 1.5 V   Lead Channel Setting Pacing Pulse Width 0.4 ms   Lead Channel Setting Pacing Amplitude 1.5 V   Lead Channel Setting Pacing Pulse Width 0.6 ms   Lead Channel Setting Pacing Amplitude 2.25 V   Lead Channel Setting Pacing Capture Mode Fixed Pacing    Lead Channel Impedance Value 456 ohm   Lead Channel Sensing Intrinsic Amplitude 1.875 mV   Lead Channel Sensing  Intrinsic Amplitude 1.875 mV   Lead Channel  Pacing Threshold Amplitude 0.625 V   Lead Channel Pacing Threshold Pulse Width 0.4 ms   Lead Channel Impedance Value 399 ohm   Lead Channel Impedance Value 342 ohm   Lead Channel Sensing Intrinsic Amplitude 16 mV   Lead Channel Sensing Intrinsic Amplitude 16 mV   Lead Channel Pacing Threshold Amplitude 0.75 V   Lead Channel Pacing Threshold Pulse Width 0.4 ms   HighPow Impedance 48 ohm   HighPow Impedance 61 ohm   Lead Channel Impedance Value 931 ohm   Lead Channel Impedance Value 589 ohm   Lead Channel Impedance Value 475 ohm   Battery Status OK    Battery Remaining Longevity 2 mo   Battery Voltage 2.76 V   Brady Statistic RA Percent Paced 99.01 %   Brady Statistic RV Percent Paced 99.14 %   Brady Statistic AP VP Percent 98.71 %   Brady Statistic AS VP Percent 0.94 %   Brady Statistic AP VS Percent 0.34 %   Brady Statistic AS VS Percent 0.01 %  CUP PACEART REMOTE DEVICE CHECK     Status: None   Collection Time: 09/23/21 10:29 AM  Result Value Ref Range   Date Time Interrogation Session [phone]    Pulse Generator Manufacturer MERM    Pulse Gen Model DTBA1D1 Viva XT CRT-D    Pulse Gen Serial Number [id number]    Clinic Name [name]    Implantable Pulse Generator Type Cardiac Resynch Therapy Defibulator    Implantable Pulse Generator Implant Date LQ:7431572    Implantable Lead Manufacturer MERM    Implantable Lead Model 4196 Attain Ability    Implantable Lead Serial Number [id number]    Implantable Lead Implant Date ZP:2808749    Implantable Lead Location Detail 1 Cardiac Vein (Left)    Implantable Lead Location [id number]    Implantable Lead Manufacturer [facility]    Implantable Lead Model 5076 CapSureFix Novus    Implantable Lead Serial Number [id number]    Implantable Lead Implant Date ZP:2808749    Implantable Lead Location Detail 1 APPENDAGE    Implantable Lead Location [id number]    Implantable Lead Manufacturer [facility]     Implantable Lead Model [id number] Sprint Quattro Secure    Implantable Lead Serial Number [id number]    Implantable Lead Implant Date GR:2380182    Implantable Lead Location Detail 1 APEX    Implantable Lead Special Function      Diagnosis:  Ischemic heart disease w/bronchial arrest   Implantable Lead Location [id number]    Lead Channel Setting Sensing Sensitivity 0.3 mV   Lead Channel Setting Pacing Amplitude 1.5 V   Lead Channel Setting Pacing Pulse Width 0.4 ms   Lead Channel Setting Pacing Amplitude 1.5 V   Lead Channel Setting Pacing Pulse Width 0.6 ms   Lead Channel Setting Pacing Amplitude 2.25 V   Lead Channel Setting Pacing Capture Mode Fixed Pacing    Lead Channel Impedance Value 456 ohm   Lead Channel Sensing Intrinsic Amplitude 2.25 mV   Lead Channel Sensing Intrinsic Amplitude 2.25 mV   Lead Channel Pacing Threshold Amplitude 0.625 V   Lead Channel Pacing Threshold Pulse Width 0.4 ms   Lead Channel Impedance Value 399 ohm   Lead Channel Impedance Value 418 ohm   Lead Channel Sensing Intrinsic Amplitude 16 mV   Lead Channel Sensing Intrinsic Amplitude 16 mV   Lead Channel Pacing Threshold Amplitude 0.75 V   Lead Channel Pacing Threshold Pulse Width 0.4 ms   HighPow Impedance 48 ohm  HighPow Impedance 62 ohm   Lead Channel Impedance Value 893 ohm   Lead Channel Impedance Value 551 ohm   Lead Channel Impedance Value 456 ohm   Battery Status OK    Battery Remaining Longevity 2 mo   Battery Voltage 2.74 V   Brady Statistic RA Percent Paced 98.22 %   Brady Statistic RV Percent Paced 99.02 %   Brady Statistic AP VP Percent 98.03 %   Brady Statistic AS VP Percent 1.64 %   Brady Statistic AP VS Percent 0.32 %   Brady Statistic AS VS Percent 0.01 %  Hepatic function panel     Status: None   Collection Time: 09/29/21  4:12 PM  Result Value Ref Range   Total Protein 6.6 6.0 - 8.5 g/dL   Albumin 4.3 3.6 - 4.6 g/dL   Bilirubin Total 0.5 0.0 - 1.2 mg/dL   Bilirubin, Direct 0.13 0.00 -  0.40 mg/dL   Alkaline Phosphatase 89 44 - 121 IU/L   AST 21 0 - 40 IU/L   ALT 18 0 - 44 IU/L  TSH     Status: None   Collection Time: 09/29/21  4:12 PM  Result Value Ref Range   TSH 1.630 0.450 - 4.500 uIU/mL  CUP PACEART INCLINIC DEVICE CHECK     Status: None   Collection Time: 09/29/21  5:27 PM  Result Value Ref Range   Date Time Interrogation Session [id number]    Pulse Generator Manufacturer MERM    Pulse Gen Model DTBA1D1 Viva XT CRT-D    Pulse Gen Serial Number [id number]    Clinic Name [place]    Implantable Pulse Generator Type Cardiac Resynch Therapy Defibulator    Implantable Pulse Generator Implant Date LQ:7431572    Implantable Lead Manufacturer MERM    Implantable Lead Model 4196 Attain Ability    Implantable Lead Serial Number [id number]    Implantable Lead Implant Date ZP:2808749    Implantable Lead Location Detail 1 Cardiac Vein (Left)    Implantable Lead Location [id number]    Implantable Lead Manufacturer MERM    Implantable Lead Model 5076 CapSureFix Novus    Implantable Lead Serial Number [id number]    Implantable Lead Implant Date ZP:2808749    Implantable Lead Location Detail 1 APPENDAGE    Implantable Lead Location [id number]    Implantable Lead Manufacturer [facility]    Implantable Lead Model [phone] Sprint Quattro Secure    Implantable Lead Serial Number [id number]    Implantable Lead Implant Date GR:2380182    Implantable Lead Location Detail 1 APEX    Implantable Lead Special Function      Diagnosis:  Ischemic heart disease w/bronchial arrest   Implantable Lead Location [phone]    Lead Channel Setting Sensing Sensitivity 0.3 mV   Lead Channel Setting Pacing Amplitude 1.5 V   Lead Channel Setting Pacing Pulse Width 0.4 ms   Lead Channel Setting Pacing Amplitude 1.5 V   Lead Channel Setting Pacing Pulse Width 1.5 ms   Lead Channel Setting Pacing Amplitude 2.25 V   Lead Channel Setting Pacing Capture Mode Fixed Pacing    Lead Channel Impedance Value 456 ohm    Lead Channel Sensing Intrinsic Amplitude 1.875 mV   Lead Channel Sensing Intrinsic Amplitude 1.875 mV   Lead Channel Pacing Threshold Amplitude 0.75 V   Lead Channel Pacing Threshold Pulse Width 0.4 ms   Lead Channel Impedance Value 513 ohm   Lead Channel Impedance Value 475 ohm   Lead Channel Sensing Intrinsic Amplitude  18.125 mV   Lead Channel Sensing Intrinsic Amplitude 16 mV   Lead Channel Pacing Threshold Amplitude 0.75 V   Lead Channel Pacing Threshold Pulse Width 0.4 ms   HighPow Impedance 53 ohm   HighPow Impedance 71 ohm   Lead Channel Impedance Value 1,026 ohm   Lead Channel Impedance Value 665 ohm   Lead Channel Impedance Value 475 ohm   Lead Channel Pacing Threshold Amplitude 2.75 V   Lead Channel Pacing Threshold Pulse Width 0.60 ms   Battery Status OK    Battery Remaining Longevity 2 mo   Battery Voltage 2.70 V   Brady Statistic RA Percent Paced 99.05 %   Brady Statistic RV Percent Paced 99.56 %   Brady Statistic AP VP Percent 98.92 %   Brady Statistic AS VP Percent 0.89 %   Brady Statistic AP VS Percent 0.18 %   Brady Statistic AS VS Percent 0.01 %   Eval Rhythm SB w/ 1st AV block   Basic metabolic panel per protocol     Status: Abnormal   Collection Time: 10/01/21  9:20 AM  Result Value Ref Range   Sodium 136 135 - 145 mmol/L   Potassium 4.2 3.5 - 5.1 mmol/L   Chloride 104 98 - 111 mmol/L   CO2 25 22 - 32 mmol/L   Glucose, Bld 239 (H) 70 - 99 mg/dL    Comment: Glucose reference range applies only to samples taken after fasting for at least 8 hours.   BUN 20 8 - 23 mg/dL   Creatinine, Ser 1.20 0.61 - 1.24 mg/dL   Calcium 9.0 8.9 - 10.3 mg/dL   GFR, Estimated >60 >60 mL/min    Comment: (NOTE) Calculated using the CKD-EPI Creatinine Equation (2021)    Anion gap 7 5 - 15    Comment: Performed at [facility], [place] [address], [place], [place] [address]  CBC per protocol     Status: None   Collection Time: 10/01/21  9:20 AM  Result Value  Ref Range   WBC 6.6 4.0 - 10.5 K/uL   RBC 4.74 4.22 - 5.81 MIL/uL   Hemoglobin 15.3 13.0 - 17.0 g/dL   HCT 46.4 39.0 - 52.0 %   MCV 97.9 80.0 - 100.0 fL   MCH 32.3 26.0 - 34.0 pg   MCHC 33.0 30.0 - 36.0 g/dL   RDW 12.7 11.5 - 15.5 %   Platelets 249 150 - 400 K/uL   nRBC 0.0 0.0 - 0.2 %    Comment: Performed at [facility], [place] [address] [place], [place] [address]  Type and screen Order type and screen if day of surgery is less than 15 days from draw of preadmission visit or order morning of surgery if day of surgery is greater than 6 days from preadmission visit.     Status: None   Collection Time: 10/01/21  9:20 AM  Result Value Ref Range   ABO/RH(D) A POS    Antibody Screen NEG    Sample Expiration 10/09/2021,2359    Extend sample reason      NO TRANSFUSIONS OR PREGNANCY IN THE PAST 3 MONTHS Performed at [name], [place] [address], [place], [place] [address]   Surgical PCR Screen     Status: None   Collection Time: 10/01/21  9:20 AM   Specimen: Nasal Mucosa; Nasal Swab  Result Value Ref Range   MRSA, PCR NEGATIVE NEGATIVE   Staphylococcus aureus NEGATIVE NEGATIVE    Comment: (  NOTE) The Xpert SA Assay (FDA approved for NASAL specimens in patients 1 years of age and older), is one component of a comprehensive surveillance program. It is not intended to diagnose infection nor to guide or monitor treatment. Performed at [facility], [place] [address] [place], [place] [address]   ABO/Rh     Status: None   Collection Time: 10/06/21  9:07 AM  Result Value Ref Range   ABO/RH(D)      A POS Performed at [facility], [place] [address], [place], [place] [address]      Estimated body mass index is 32.08 kg/m as calculated from the following:   Height as of 10/01/21: 5\' 11"  (1.803 m).   Weight as of 10/01/21: 104.3 kg.   Imaging Review Plain radiographs demonstrate severe degenerative joint disease of  the left knee(s). The overall alignment issignificant varus. The bone quality appears to be fair for age and reported activity level.      Assessment/Plan:  End stage arthritis, left knee   The patient history, physical examination, clinical judgment of the provider and imaging studies are consistent with end stage degenerative joint disease of the left knee(s) and total knee arthroplasty is deemed medically necessary. The treatment options including medical management, injection therapy arthroscopy and arthroplasty were discussed at length. The risks and benefits of total knee arthroplasty were presented and reviewed. The risks due to aseptic loosening, infection, stiffness, patella tracking problems, thromboembolic complications and other imponderables were discussed. The patient acknowledged the explanation, agreed to proceed with the plan and consent was signed. Patient is being admitted for inpatient treatment for surgery, pain control, PT, OT, prophylactic antibiotics, VTE prophylaxis, progressive ambulation and ADL's and discharge planning. The patient is planning to be discharged home with home health services     Patient's anticipated LOS is less than 2 midnights, meeting these requirements: - Younger than 68 - Lives within 1 hour of care - Has a competent adult at home to recover with post-op recover - NO history of  - Chronic pain requiring opiods  - Diabetes  - Coronary Artery Disease  - Heart failure  - Heart attack  - Stroke  - DVT/VTE  - Cardiac arrhythmia  - Respiratory Failure/COPD  - Renal failure  - Anemia  - Advanced Liver disease

## 2021-10-06 NOTE — Anesthesia Procedure Notes (Signed)
Anesthesia Regional Block: Adductor canal block   Pre-Anesthetic Checklist: , timeout performed,  Correct Patient, Correct Site, Correct Laterality,  Correct Procedure, Correct Position, site marked,  Risks and benefits discussed,  Surgical consent,  Pre-op evaluation,  At surgeon's request and post-op pain management  Laterality: Lower and Left  Prep: chloraprep       Needles:  Injection technique: Single-shot  Needle Type: Echogenic Needle     Needle Length: 9cm  Needle Gauge: 22     Additional Needles:   Procedures:,,,, ultrasound used (permanent image in chart),,    Narrative:  Start time: 10/06/2021 11:08 AM End time: 10/06/2021 11:13 AM Injection made incrementally with aspirations every 5 mL.  Performed by: Personally  Anesthesiologist: Trevor Iha, MD  Additional Notes: Block assessed prior to surgery. Pt tolerated procedure well.

## 2021-10-06 NOTE — Anesthesia Procedure Notes (Signed)
Procedure Name: MAC Date/Time: 10/06/2021 12:41 PM Performed by: Eben Burow, CRNA Pre-anesthesia Checklist: Patient identified, Emergency Drugs available, Suction available, Patient being monitored and Timeout performed Oxygen Delivery Method: Simple face mask Placement Confirmation: positive ETCO2

## 2021-10-06 NOTE — Anesthesia Procedure Notes (Addendum)
Spinal  Patient location during procedure: OR Start time: 10/06/2021 12:44 PM Reason for block: surgical anesthesia Staffing Performed: resident/CRNA  Anesthesiologist: Barnet Glasgow, MD Resident/CRNA: Eben Burow, CRNA Preanesthetic Checklist Completed: patient identified, IV checked, site marked, risks and benefits discussed, surgical consent, monitors and equipment checked, pre-op evaluation and timeout performed Spinal Block Patient position: sitting Prep: DuraPrep and site prepped and draped Patient monitoring: heart rate, cardiac monitor, continuous pulse ox and blood pressure Approach: midline Location: L3-4 Injection technique: single-shot Needle Needle type: Pencan  Needle gauge: 24 G Needle length: 10 cm Assessment Events: CSF return Additional Notes Pt placed in sitting position, spinal kit expiration date checked and verified, + CSF, - heme, pt tolerated well. Sensory block adequate. Dr Valma Cava present and supervising throughout placement of SAB.

## 2021-10-06 NOTE — Discharge Instructions (Signed)

## 2021-10-06 NOTE — Progress Notes (Signed)
Orthopedic Tech Progress Note Patient Details:  [name] 10/28/1940 [id number]  CPM Left Knee CPM Left Knee: On Left Knee Flexion (Degrees): 90 Left Knee Extension (Degrees): 0 Additional Comments: foot roll  Post Interventions Patient Tolerated: Well Instructions Provided: Care of device, Adjustment of device  Saul Fordyce 10/06/2021, 2:33 PM

## 2021-10-06 NOTE — Transfer of Care (Signed)
Immediate Anesthesia Transfer of Care Note  Patient: [name]  Procedure(s) Performed: TOTAL KNEE ARTHROPLASTY (Left: Knee)  Patient Location: PACU  Anesthesia Type:Spinal  Level of Consciousness: awake, drowsy and patient cooperative  Airway & Oxygen Therapy: Patient Spontanous Breathing and Patient connected to face mask oxygen  Post-op Assessment: Report given to RN and Post -op Vital signs reviewed and stable  Post vital signs: Reviewed and stable  Last Vitals:  Vitals Value Taken Time  BP 122/67 10/06/21 1428  Temp    Pulse 58 10/06/21 1429  Resp 22 10/06/21 1429  SpO2 97 % 10/06/21 1429  Vitals shown include unvalidated device data.  Last Pain:  Vitals:   10/06/21 0830  TempSrc: Oral         Complications: No notable events documented.

## 2021-10-07 ENCOUNTER — Ambulatory Visit: Payer: Medicare PPO | Admitting: Cardiovascular Disease

## 2021-10-07 ENCOUNTER — Encounter (HOSPITAL_COMMUNITY): Payer: Self-pay | Admitting: Orthopedic Surgery

## 2021-10-07 DIAGNOSIS — M1712 Unilateral primary osteoarthritis, left knee: Secondary | ICD-10-CM | POA: Diagnosis not present

## 2021-10-07 LAB — CBC
HCT: 39.6 % (ref 39.0–52.0)
Hemoglobin: 13.4 g/dL (ref 13.0–17.0)
MCH: 32.6 pg (ref 26.0–34.0)
MCHC: 33.8 g/dL (ref 30.0–36.0)
MCV: 96.4 fL (ref 80.0–100.0)
Platelets: 208 10*3/uL (ref 150–400)
RBC: 4.11 MIL/uL — ABNORMAL LOW (ref 4.22–5.81)
RDW: 12.4 % (ref 11.5–15.5)
WBC: 14.2 10*3/uL — ABNORMAL HIGH (ref 4.0–10.5)
nRBC: 0 % (ref 0.0–0.2)

## 2021-10-07 NOTE — Evaluation (Signed)
Physical Therapy Evaluation Patient Details Name: [name] MRN: [id number] DOB: 1941/04/19 Today's Date: 10/07/2021  History of Present Illness  81 yo male s/p L TKA 10/06/21.  Clinical Impression  On eval, pt required Min A for mobility. He walked ~75 feet with a RW. Moderate pain with activity. Will plan to have a 2nd session prior to d/c home later today if pt meets his PT goals.        Recommendations for follow up therapy are one component of a multi-disciplinary discharge planning process, led by the attending physician.  Recommendations may be updated based on patient status, additional functional criteria and insurance authorization.  Follow Up Recommendations Follow physician's recommendations for discharge plan and follow up therapies    Assistance Recommended at Discharge Frequent or constant Supervision/Assistance  Patient can return home with the following  A little help with walking and/or transfers;A little help with bathing/dressing/bathroom;Help with stairs or ramp for entrance;Assist for transportation;Assistance with cooking/housework    Equipment Recommendations None recommended by PT  Recommendations for Other Services       Functional Status Assessment Patient has had a recent decline in their functional status and demonstrates the ability to make significant improvements in function in a reasonable and predictable amount of time.     Precautions / Restrictions Precautions Precautions: Fall;Knee Restrictions Weight Bearing Restrictions: No Other Position/Activity Restrictions: WBAT      Mobility  Bed Mobility Overal bed mobility: Needs Assistance Bed Mobility: Supine to Sit     Supine to sit: Min guard     General bed mobility comments: Min guard for safety. Cues provided.    Transfers Overall transfer level: Needs assistance Equipment used: Rolling walker (2 wheels) Transfers: Sit to/from Stand Sit to Stand: Min assist, From elevated  surface           General transfer comment: Assist to rise, steady, control descent. Cues for safety, technique, hand/LE placement    Ambulation/Gait Ambulation/Gait assistance: Min assist Gait Distance (Feet): 75 Feet Assistive device: Rolling walker (2 wheels) Gait Pattern/deviations: Step-to pattern, Step-through pattern, Decreased stride length       General Gait Details: Cues for safety, sequencing, proper use of RW. Pt denied dizziness. Tolerated distance well.  Stairs            Wheelchair Mobility    Modified Rankin (Stroke Patients Only)       Balance Overall balance assessment: Needs assistance         Standing balance support: Bilateral upper extremity supported Standing balance-Leahy Scale: Poor                               Pertinent Vitals/Pain Pain Assessment Pain Assessment: 0-10 Pain Score: 5  Pain Location: L knee Pain Descriptors / Indicators: Discomfort, Sore, Operative site guarding, Grimacing Pain Intervention(s): Limited activity within patient's tolerance, Monitored during session, Ice applied, Repositioned    Home Living Family/patient expects to be discharged to:: Private residence Living Arrangements: Alone Available Help at Discharge: Family Type of Home: House Home Access: Stairs to enter Entrance Stairs-Rails: None Entrance Stairs-Number of Steps: 2-3   Home Layout: One level Home Equipment: Conservation officer, nature (2 wheels) Additional Comments: has a CPM on rental    Prior Function Prior Level of Function : Independent/Modified Independent                     Hand Dominance  Extremity/Trunk Assessment   Upper Extremity Assessment Upper Extremity Assessment: Overall WFL for tasks assessed    Lower Extremity Assessment Lower Extremity Assessment: Generalized weakness    Cervical / Trunk Assessment Cervical / Trunk Assessment: Normal  Communication   Communication: No difficulties   Cognition Arousal/Alertness: Awake/alert Behavior During Therapy: WFL for tasks assessed/performed Overall Cognitive Status: Within Functional Limits for tasks assessed                                          General Comments      Exercises Total Joint Exercises Ankle Circles/Pumps: AROM, Both, 10 reps Quad Sets: AROM, Both, 10 reps Hip ABduction/ADduction: AROM, AAROM, Left, 10 reps Straight Leg Raises: AROM, AAROM, Left, 10 reps Knee Flexion: AAROM, Left, 10 reps, Seated Goniometric ROM: ~10-70 degrees   Assessment/Plan    PT Assessment Patient needs continued PT services  PT Problem List Decreased strength;Decreased mobility;Decreased range of motion;Decreased activity tolerance;Decreased balance;Decreased knowledge of use of DME;Pain       PT Treatment Interventions DME instruction;Gait training;Therapeutic activities;Therapeutic exercise;Patient/family education;Balance training;Functional mobility training;Stair training    PT Goals (Current goals can be found in the Care Plan section)  Acute Rehab PT Goals Patient Stated Goal: regain PLOF/independence PT Goal Formulation: With patient Time For Goal Achievement: 10/21/21 Potential to Achieve Goals: Good    Frequency 7X/week     Co-evaluation               AM-PAC PT "6 Clicks" Mobility  Outcome Measure Help needed turning from your back to your side while in a flat bed without using bedrails?: A Little Help needed moving from lying on your back to sitting on the side of a flat bed without using bedrails?: A Little Help needed moving to and from a bed to a chair (including a wheelchair)?: A Little Help needed standing up from a chair using your arms (e.g., wheelchair or bedside chair)?: A Little Help needed to walk in hospital room?: A Little Help needed climbing 3-5 steps with a railing? : A Little 6 Click Score: 18    End of Session Equipment Utilized During Treatment: Gait  belt Activity Tolerance: Patient tolerated treatment well Patient left: in chair;with call bell/phone within reach   PT Visit Diagnosis: Pain;Other abnormalities of gait and mobility (R26.89) Pain - Right/Left: Left Pain - part of body: Knee    Time: [id number] PT Time Calculation (min) (ACUTE ONLY): 20 min   Charges:   PT Evaluation $PT Eval Low Complexity: [place], PT Acute Rehabilitation  Office: [phone] Pager: [phone]

## 2021-10-07 NOTE — Discharge Summary (Signed)
Patient ID: [name] MRN: [id number] DOB/AGE: [birth year] 82 y.o.  Admit date: 10/06/2021 Discharge date: 10/07/2021  Admission Diagnoses:  Principal Problem:   Primary osteoarthritis of left knee   Discharge Diagnoses:  Same  Past Medical History:  Diagnosis Date   AICD (automatic cardioverter/defibrillator) present    Arthritis    knees, back    BPH (benign prostatic hypertrophy)    CHF (congestive heart failure) (HCC)    Chronic kidney disease    BPH   Chronic systolic heart failure ([place])    a. s/p MDT single chamber ICD 2004 as part of MASTER study b. upgrade to CRTD 2010; CRTD gen change 2016   Coronary artery disease    a. s/p anterior MI and CYPHER stent to LAD 2005   Dyslipidemia    Dysrhythmia    a-fib   Gout    Ischemic cardiomyopathy    Paroxysmal atrial fibrillation (HCC)    Ventricular tachycardia     Surgeries: Procedure(s):Left TOTAL KNEE ARTHROPLASTY on 10/06/2021   Consultants: None  Discharged Condition: Improved  Hospital Course: FERANDO SKILLIN is an 81 y.o. male who was admitted 10/06/2021 for operative treatment ofPrimary osteoarthritis of left knee. Patient has severe unremitting pain that affects sleep, daily activities, and work/hobbies. After pre-op clearance the patient was taken to the operating room on 10/06/2021 and underwent  Procedure(s):Left TOTAL KNEE ARTHROPLASTY.    Patient was given perioperative antibiotics:  Anti-infectives (From admission, onward)    Start     Dose/Rate Route Frequency Ordered Stop   10/06/21 1800  ceFAZolin (ANCEF) IVPB 2g/100 mL premix        2 g 200 mL/hr over 30 Minutes Intravenous Every 6 hours 10/06/21 1704 10/07/21 0029   10/06/21 0830  ceFAZolin (ANCEF) IVPB 2g/100 mL premix        2 g 200 mL/hr over 30 Minutes Intravenous On call to O.R. 10/06/21 [id number] 10/06/21 1300        Patient was given sequential compression devices, early ambulation, and chemoprophylaxis to prevent DVT.  Patient  benefited maximally from hospital stay and there were no complications.    Recent vital signs: Patient Vitals for the past 24 hrs:  BP Temp Temp src Pulse Resp SpO2 Height Weight  10/07/21 0531 113/66 97.6 F (36.4 C) Oral 60 16 96 % -- --  10/07/21 0151 (!) 115/54 98.3 F (36.8 C) Axillary 63 18 96 % -- --  10/06/21 2235 129/64 97.8 F (36.6 C) Axillary 60 18 98 % -- --  10/06/21 1802 (!) 143/75 (!) 97.3 F (36.3 C) Oral 60 16 99 % 5\' 11"  (1.803 m) 104.3 kg  10/06/21 1612 137/70 97.6 F (36.4 C) Oral (!) 59 14 96 % -- --  10/06/21 1545 134/72 97.7 F (36.5 C) -- 60 12 97 % -- --  10/06/21 1530 139/71 -- -- 60 15 97 % -- --  10/06/21 1515 137/74 -- -- 60 17 96 % -- --  10/06/21 1510 -- -- -- 60 -- -- -- --  10/06/21 1500 132/70 -- -- 60 13 97 % -- --  10/06/21 1445 132/74 -- -- 62 14 97 % -- --  10/06/21 1430 119/76 -- -- (!) 58 (!) 22 97 % -- --  10/06/21 1428 122/67 98 F (36.7 C) -- (!) 59 18 96 % -- --  10/06/21 1125 -- -- -- 66 13 98 % -- --  10/06/21 1120 -- -- -- (!) 59 16 99 % -- --  10/06/21 1115 (!) 162/88 -- -- (!) 59 19 98 % -- --  10/06/21 1110 -- -- -- (!) 59 18 99 % -- --  10/06/21 1105 -- -- -- (!) 59 16 99 % -- --  10/06/21 1100 (!) 145/82 -- -- 63 17 100 % -- --  10/06/21 1055 (!) 150/82 -- -- 66 (!) 22 97 % -- --     Recent laboratory studies:  Recent Labs    10/07/21 0313  WBC 14.2*  HGB 13.4  HCT 39.6  PLT 208     Discharge Medications:   Allergies as of 10/07/2021       Reactions   Atorvastatin Other (See Comments)   MUSCLE ACHE         Medication List     STOP taking these medications    aspirin EC 81 MG tablet   ibuprofen 200 MG tablet Commonly known as: ADVIL   Lysine 500 MG Caps   naproxen sodium 220 MG tablet Commonly known as: ALEVE   TURMERIC PO       TAKE these medications    allopurinol 100 MG tablet Commonly known as: ZYLOPRIM Take 100 mg by mouth daily.   amiodarone 200 MG tablet Commonly known as:  PACERONE Take 0.5 tablets (100 mg total) by mouth daily.   calcium carbonate 500 MG chewable tablet Commonly known as: TUMS - dosed in mg elemental calcium Chew 1 tablet by mouth daily as needed for indigestion or heartburn.   carvedilol 3.125 MG tablet Commonly known as: COREG TAKE 1 TABLET TWICE DAILY   Eliquis 5 MG Tabs tablet Generic drug: apixaban TAKE 1 TABLET TWICE DAILY   furosemide 40 MG tablet Commonly known as: LASIX Take 1 tablet (40 mg total) by mouth daily.   HYDROcodone-acetaminophen 5-325 MG tablet Commonly known as: Norco Take 1-2 tablets by mouth every 6 (six) hours as needed for moderate pain.   latanoprost 0.005 % ophthalmic solution Commonly known as: XALATAN Place 1 drop into both eyes at bedtime.   levothyroxine 125 MCG tablet Commonly known as: Synthroid Take 1 tablet (125 mcg total) by mouth daily before breakfast.   nitroGLYCERIN 0.4 MG SL tablet Commonly known as: NITROSTAT Place 1 tablet (0.4 mg total) under the tongue every 5 (five) minutes as needed for chest pain.   Polyethyl Glycol-Propyl Glycol 0.4-0.3 % Soln Place 1 drop into both eyes daily.   potassium chloride 10 MEQ tablet Commonly known as: KLOR-CON Take 1 tablet (10 mEq total) by mouth every Monday, Wednesday, and Friday.   rosuvastatin 10 MG tablet Commonly known as: CRESTOR TAKE ONE-HALF TABLET BY  MOUTH DAILY   tiZANidine 2 MG tablet Commonly known as: ZANAFLEX Take 1 tablet (2 mg total) by mouth every 8 (eight) hours as needed for muscle spasms.               Durable Medical Equipment  (From admission, onward)           Start     Ordered   10/06/21 1612  DME Walker rolling  Once       Question:  Patient needs a walker to treat with the following condition  Answer:  Primary osteoarthritis of left knee   10/06/21 1611   10/06/21 1612  DME 3 n 1  Once        10/06/21 1611              Discharge Care Instructions  (From admission, onward)  Start     Ordered   10/07/21 0000  Weight bearing as tolerated       Question Answer Comment  Laterality left   Extremity Lower      10/07/21 1005            Diagnostic Studies: DG Chest 2 View  Result Date: 10/01/2021 CLINICAL DATA:  Preoperative chest radiograph. Left knee replacement scheduled 10/06/2021. EXAM: CHEST - 2 VIEW COMPARISON:  03/20/2020 FINDINGS: Left chest wall cardiac AICD is again seen with leads overlying the right atrium, right ventricle, and coronary sinus. A coronary artery stent is again noted. Heart size is at the upper limits of normal, unchanged. Mediastinal contours are within normal limits. The lungs are clear. No pleural effusion pneumothorax. Moderate multilevel degenerative disc changes of the thoracic spine. IMPRESSION: No active cardiopulmonary disease. Electronically Signed   By: Yvonne Kendall M.D.   On: 10/01/2021 16:17   CUP PACEART INCLINIC DEVICE CHECK  Result Date: 09/29/2021 CRT-D device check in office. Thresholds and sensing consistent with previous device measurements. Lead impedance trends stable over time. No mode switch episodes recorded. No ventricular arrhythmia episodes recorded. Patient bi-ventricularly pacing 99.6% of the time. Device programmed with appropriate safety margins. Intermittent LV capture noted with output programmed 2.25V@0 .59ms. Per SK pulse width programmed 1.66ms. Heart failure diagnostics reviewed and trends are stable for patient. Audible alerts demonstrated for patient. Estimated longevity 2 months.  Patient enrolled in remote follow up.Theodoro Doing BSN,RN,CCDS  CUP PACEART REMOTE DEVICE CHECK  Result Date: 09/23/2021 Scheduled remote reviewed. Normal device function.  Battery estimated 61mo Next remote 1/23, office 1/26 [place]   Disposition: Discharge disposition: 01-Home or Self Care       Discharge Instructions     CPM   Complete by: As directed    Continuous passive motion machine (CPM):      Use the  CPM from 0 to 60 for 8 hours per day.      You may increase by 5-10 per day.  You may break it up into 2 or 3 sessions per day.      Use CPM for 1-2 weeks or until you are told to stop.   Call MD / Call 911   Complete by: As directed    If you experience chest pain or shortness of breath, CALL 911 and be transported to the hospital emergency room.  If you develope a fever above 101 F, pus (white drainage) or increased drainage or redness at the wound, or calf pain, call your surgeon's office.   Constipation Prevention   Complete by: As directed    Drink plenty of fluids.  Prune juice may be helpful.  You may use a stool softener, such as Colace (over the counter) 100 mg twice a day.  Use MiraLax (over the counter) for constipation as needed.   Diet general   Complete by: As directed    Do not put a pillow under the knee. Place it under the heel.   Complete by: As directed    Increase activity slowly as tolerated   Complete by: As directed    Post-operative opioid taper instructions:   Complete by: As directed    POST-OPERATIVE OPIOID TAPER INSTRUCTIONS: It is important to wean off of your opioid medication as soon as possible. If you do not need pain medication after your surgery it is ok to stop day one. Opioids include: Codeine, Hydrocodone(Norco, Vicodin), Oxycodone(Percocet, oxycontin) and hydromorphone amongst others.  Long term and even  short term use of opiods can cause: Increased pain response Dependence Constipation Depression Respiratory depression And more.  Withdrawal symptoms can include Flu like symptoms Nausea, vomiting And more Techniques to manage these symptoms Hydrate well Eat regular healthy meals Stay active Use relaxation techniques(deep breathing, meditating, yoga) Do Not substitute Alcohol to help with tapering If you have been on opioids for less than two weeks and do not have pain than it is ok to stop all together.  Plan to wean off of  opioids This plan should start within one week post op of your joint replacement. Maintain the same interval or time between taking each dose and first decrease the dose.  Cut the total daily intake of opioids by one tablet each day Next start to increase the time between doses. The last dose that should be eliminated is the evening dose.      Weight bearing as tolerated   Complete by: As directed    Laterality: left   Extremity: Lower        Follow-up Information     Dorna Leitz, MD. Schedule an appointment as soon as possible for a visit in 2 week(s).   Specialty: Orthopedic Surgery Contact information: [place] [address] [phone]                  Signed: Erlene Senters 10/07/2021, 10:05 AM

## 2021-10-07 NOTE — Progress Notes (Signed)
Pt was discharged home today. Instructions were reviewed with patient, and questions were answered. Pt was taken to main entrance via wheelchair by NT.  

## 2021-10-07 NOTE — Progress Notes (Signed)
Subjective: 1 Day Post-Op Procedure(s) (LRB): TOTAL KNEE ARTHROPLASTY (Left) Patient reports pain as mild.  The patient is sitting on the side of the bed.  Foley catheter was removed this morning.  When seen at 81 had not voided yet.  Daughter at bedside.  Taking by mouth okay.  Objective: Vital signs in last 24 hours: Temp:  [97.3 F (36.3 C)-98.3 F (36.8 C)] 97.6 F (36.4 C) (01/31 0531) Pulse Rate:  [58-66] 60 (01/31 0531) Resp:  [12-22] 16 (01/31 0531) BP: (113-162)/(54-88) 113/66 (01/31 0531) SpO2:  [96 %-100 %] 96 % (01/31 0531) Weight:  [104.3 kg] 104.3 kg (01/30 1802)  Intake/Output from previous day: 01/30 0701 - 01/31 0700 In: 3389.4 [P.O.:220; I.V.:2719.4; IV Piggyback:450] Out: 1875 [Urine:1825; Blood:50] Intake/Output this shift: No intake/output data recorded.  Recent Labs    10/07/21 0313  HGB 13.4   Recent Labs    10/07/21 0313  WBC 14.2*  RBC 4.11*  HCT 39.6  PLT 208   No results for input(s): NA, K, CL, CO2, BUN, CREATININE, GLUCOSE, CALCIUM in the last 72 hours. No results for input(s): LABPT, INR in the last 72 hours. Left knee exam: Neurovascular intact Sensation intact distally Intact pulses distally Dorsiflexion/Plantar flexion intact Incision: dressing C/D/I No cellulitis present   Assessment/Plan: 1 Day Post-Op Procedure(s) (LRB): TOTAL KNEE ARTHROPLASTY (Left) Plan: Resume Eliquis 5 mg b.I.d. which she took preoperatively for atrial fit. Up with therapy Weight-bear as tolerated on left. Discharge home today after physical therapy. Follow-up with Dr. Berenice Primas in 2 weeks.   Patient's anticipated LOS is less than 2 midnights, meeting these requirements: - Lives within 1 hour of care - Has a competent adult at home to recover with post-op recover - NO history of  - Chronic pain requiring opiods  - Diabetes  - Coronary Artery Disease  - Heart failure  - Heart attack  - Stroke  - DVT/VTE   - Respiratory Failure/COPD  - Renal  failure  - Anemia  - Advanced Liver disease     Erlene Senters 10/07/2021, 10:01 AM

## 2021-10-07 NOTE — Plan of Care (Signed)

## 2021-10-07 NOTE — Progress Notes (Signed)
Physical Therapy Treatment Patient Details Name: [name] MRN: [id number] DOB: 1940/11/14 Today's Date: 10/07/2021   History of Present Illness 81 yo male s/p L TKA 10/06/21.    PT Comments    Progressing with mobility. Reviewed/practiced gait and stair training. Family declined to come into hospital for education. Issued stair handout to patient. All education completed.   Recommendations for follow up therapy are one component of a multi-disciplinary discharge planning process, led by the attending physician.  Recommendations may be updated based on patient status, additional functional criteria and insurance authorization.  Follow Up Recommendations  Follow physician's recommendations for discharge plan and follow up therapies     Assistance Recommended at Discharge Frequent or constant Supervision/Assistance  Patient can return home with the following A little help with walking and/or transfers;A little help with bathing/dressing/bathroom;Help with stairs or ramp for entrance;Assist for transportation;Assistance with cooking/housework   Equipment Recommendations  None recommended by PT    Recommendations for Other Services       Precautions / Restrictions Precautions Precautions: Fall;Knee Restrictions Weight Bearing Restrictions: No Other Position/Activity Restrictions: WBAT     Mobility  Bed Mobility Overal bed mobility: Needs Assistance Bed Mobility: Supine to Sit     Supine to sit: Min guard     General bed mobility comments: Min guard for safety. Cues provided.    Transfers Overall transfer level: Needs assistance Equipment used: Rolling walker (2 wheels) Transfers: Sit to/from Stand Sit to Stand: Min guard, From elevated surface           General transfer comment: Increased time. Cues for safety, technique, hand/LE placement    Ambulation/Gait Ambulation/Gait assistance: Min guard Gait Distance (Feet): 75 Feet Assistive device: Rolling  walker (2 wheels) Gait Pattern/deviations: Step-through pattern, Decreased stride length, Trunk flexed       General Gait Details: Cues for safety, sequencing, proper use of RW. Pt denied dizziness. Tolerated distance well.   Stairs Stairs: Yes Stairs assistance: Min assist Stair Management: Step to pattern, Backwards, With walker Number of Stairs: 2 General stair comments: Pt reported he will not have any railings to use so practiced going up backwards with RW. Cues for safety, technique, sequence.   Wheelchair Mobility    Modified Rankin (Stroke Patients Only)       Balance Overall balance assessment: Needs assistance         Standing balance support: During functional activity, Bilateral upper extremity supported Standing balance-Leahy Scale: Fair                              Cognition Arousal/Alertness: Awake/alert Behavior During Therapy: WFL for tasks assessed/performed Overall Cognitive Status: Within Functional Limits for tasks assessed                                          Exercises     General Comments        Pertinent Vitals/Pain Pain Assessment Pain Assessment: 0-10 Pain Score: 5  Pain Location: L knee Pain Descriptors / Indicators: Discomfort, Sore, Operative site guarding, Grimacing Pain Intervention(s): Monitored during session, Repositioned    Home Living                          Prior Function  PT Goals (current goals can now be found in the care plan section) Acute Rehab PT Goals Patient Stated Goal: regain PLOF/independence PT Goal Formulation: With patient Time For Goal Achievement: 10/21/21 Potential to Achieve Goals: Good Progress towards PT goals: Progressing toward goals    Frequency    7X/week      PT Plan Current plan remains appropriate    Co-evaluation              AM-PAC PT "6 Clicks" Mobility   Outcome Measure  Help needed turning from your back  to your side while in a flat bed without using bedrails?: A Little Help needed moving from lying on your back to sitting on the side of a flat bed without using bedrails?: A Little Help needed moving to and from a bed to a chair (including a wheelchair)?: A Little Help needed standing up from a chair using your arms (e.g., wheelchair or bedside chair)?: A Little Help needed to walk in hospital room?: A Little Help needed climbing 3-5 steps with a railing? : A Little 6 Click Score: 18    End of Session Equipment Utilized During Treatment: Gait belt Activity Tolerance: Patient tolerated treatment well Patient left: in bed;with call bell/phone within reach   PT Visit Diagnosis: Pain;Other abnormalities of gait and mobility (R26.89) Pain - Right/Left: Left Pain - part of body: Knee     Time: [id number] PT Time Calculation (min) (ACUTE ONLY): 23 min  Charges:  $Gait Training: 23-37 mins                         Doreatha Massed, PT Acute Rehabilitation  Office: [phone] Pager: [phone]

## 2021-10-07 NOTE — TOC Transition Note (Signed)
Transition of Care [facility]) - CM/SW Discharge Note   Patient Details  Name: [name] MRN: [id number] Date of Birth: 11/04/1940  [facility] CM/SW Contact:  Lennart Pall, LCSW Phone Number: 10/07/2021, 1:31 PM   Clinical Narrative:    Met with pt and confirming he has all needed DME at home.  HHPT prearranged with [facility].  No further TOC needs.   Final next level of care: [place] Barriers to Discharge: No Barriers Identified   Patient Goals and CMS Choice Patient states their goals for this hospitalization and ongoing recovery are:: return home      Discharge Placement                       Discharge Plan and Services                DME Arranged: N/A DME Agency: NA       HH Arranged: PT [place] Agency: [facility]        Social Determinants of Health (SDOH) Interventions     Readmission Risk Interventions No flowsheet data found.

## 2021-10-14 ENCOUNTER — Other Ambulatory Visit: Payer: Self-pay | Admitting: Cardiovascular Disease

## 2021-10-17 ENCOUNTER — Other Ambulatory Visit: Payer: Self-pay | Admitting: *Deleted

## 2021-10-17 MED ORDER — POTASSIUM CHLORIDE ER 10 MEQ PO TBCR: 10.0000 meq | EXTENDED_RELEASE_TABLET | Freq: Every day | ORAL | 1 refills | Status: DC

## 2021-10-20 ENCOUNTER — Telehealth: Payer: Self-pay

## 2021-10-20 NOTE — Telephone Encounter (Signed)
Returned call to daughter, [name] (per DPR), as requested by voice mail message.  She reports device is alarming every morning at 6:50 AM for a couple of days.  Pt has been staying with her after knee surgery and did not have the monitor with him.  He currently at home and requested to have patient to send remote transmission to review for alarm reason.  Last [facility] remote transmission estimated ERI at 2 months. Will send to device clinic triage for follow up on ERI.

## 2021-10-20 NOTE — Telephone Encounter (Signed)
Spoke with patients daughter she agreed to appointment with Oda Kilts on 10/22/21 at 11:20 to discuss generator change out.

## 2021-10-22 ENCOUNTER — Encounter: Payer: Self-pay | Admitting: Student

## 2021-10-22 ENCOUNTER — Other Ambulatory Visit: Payer: Self-pay

## 2021-10-22 ENCOUNTER — Ambulatory Visit: Payer: Medicare PPO | Admitting: Student

## 2021-10-22 VITALS — BP 122/66 | HR 68 | Ht 71.0 in | Wt 233.0 lb

## 2021-10-22 DIAGNOSIS — I255 Ischemic cardiomyopathy: Secondary | ICD-10-CM | POA: Diagnosis not present

## 2021-10-22 DIAGNOSIS — Z95810 Presence of automatic (implantable) cardiac defibrillator: Secondary | ICD-10-CM

## 2021-10-22 DIAGNOSIS — Z79899 Other long term (current) drug therapy: Secondary | ICD-10-CM | POA: Diagnosis not present

## 2021-10-22 DIAGNOSIS — I4819 Other persistent atrial fibrillation: Secondary | ICD-10-CM

## 2021-10-22 DIAGNOSIS — I10 Essential (primary) hypertension: Secondary | ICD-10-CM

## 2021-10-22 LAB — CUP PACEART INCLINIC DEVICE CHECK
Battery Remaining Longevity: 1 mo — CL
Battery Voltage: 2.7 V
Brady Statistic AP VP Percent: 90.19 %
Brady Statistic AP VS Percent: 0.22 %
Brady Statistic AS VP Percent: 9.52 %
Brady Statistic AS VS Percent: 0.06 %
Brady Statistic RA Percent Paced: 89.96 %
Brady Statistic RV Percent Paced: 97.51 %
Date Time Interrogation Session: 20230215114203
HighPow Impedance: 50 Ohm
HighPow Impedance: 67 Ohm
Implantable Lead Implant Date: 20040315
Implantable Lead Implant Date: 20100611
Implantable Lead Implant Date: 20100611
Implantable Lead Location: 753858
Implantable Lead Location: 753859
Implantable Lead Location: 753860
Implantable Lead Model: 4196
Implantable Lead Model: 5076
Implantable Lead Model: 6947
Implantable Pulse Generator Implant Date: 20160302
Lead Channel Impedance Value: 418 Ohm
Lead Channel Impedance Value: 418 Ohm
Lead Channel Impedance Value: 456 Ohm
Lead Channel Impedance Value: 532 Ohm
Lead Channel Impedance Value: 646 Ohm
Lead Channel Impedance Value: 988 Ohm
Lead Channel Pacing Threshold Amplitude: 0.625 V
Lead Channel Pacing Threshold Amplitude: 0.875 V
Lead Channel Pacing Threshold Pulse Width: 0.4 ms
Lead Channel Pacing Threshold Pulse Width: 0.4 ms
Lead Channel Sensing Intrinsic Amplitude: 1.25 mV
Lead Channel Sensing Intrinsic Amplitude: 1.75 mV
Lead Channel Sensing Intrinsic Amplitude: 16 mV
Lead Channel Sensing Intrinsic Amplitude: 16.75 mV
Lead Channel Setting Pacing Amplitude: 1.5 V
Lead Channel Setting Pacing Amplitude: 1.5 V
Lead Channel Setting Pacing Amplitude: 2.75 V
Lead Channel Setting Pacing Pulse Width: 0.4 ms
Lead Channel Setting Pacing Pulse Width: 1.5 ms
Lead Channel Setting Sensing Sensitivity: 0.3 mV

## 2021-10-22 LAB — CBC
Hematocrit: 39.4 % (ref 37.5–51.0)
Hemoglobin: 13.4 g/dL (ref 13.0–17.7)
MCH: 32.2 pg (ref 26.6–33.0)
MCHC: 34 g/dL (ref 31.5–35.7)
MCV: 95 fL (ref 79–97)
Platelets: 288 10*3/uL (ref 150–450)
RBC: 4.16 x10E6/uL (ref 4.14–5.80)
RDW: 12.4 % (ref 11.6–15.4)
WBC: 8.9 10*3/uL (ref 3.4–10.8)

## 2021-10-22 LAB — BASIC METABOLIC PANEL
BUN/Creatinine Ratio: 13 (ref 10–24)
BUN: 15 mg/dL (ref 8–27)
CO2: 26 mmol/L (ref 20–29)
Calcium: 9.6 mg/dL (ref 8.6–10.2)
Chloride: 98 mmol/L (ref 96–106)
Creatinine, Ser: 1.15 mg/dL (ref 0.76–1.27)
Glucose: 123 mg/dL — ABNORMAL HIGH (ref 70–99)
Potassium: 4.6 mmol/L (ref 3.5–5.2)
Sodium: 139 mmol/L (ref 134–144)
eGFR: 64 mL/min/{1.73_m2} (ref >59)

## 2021-10-22 NOTE — H&P (View-Only) (Signed)
Electrophysiology Office Note Date: 10/22/2021  ID:  [name], DOB 11/26/1940, MRN [id number]  PCP: Kaleen Mask, MD Primary Cardiologist: Kristeen Miss, MD Electrophysiologist: Sherryl Manges, MD   CC: Routine ICD follow-up  [name] is a 81 y.o. male seen today for Sherryl Manges, MD for acute visit due to Device at [facility] .  Since last being seen in our clinic the patient reports doing well overall.  he denies chest pain, palpitations, dyspnea, PND, orthopnea, nausea, vomiting, dizziness, syncope, edema, weight gain, or early satiety. He has not had ICD shocks.   Device History: MDT CRT-D, gen change 11/07/2014 RA lead 2010 RV lead 2004 LV lead 2011  Past Medical History:  Diagnosis Date   AICD (automatic cardioverter/defibrillator) present    Arthritis    knees, back    BPH (benign prostatic hypertrophy)    CHF (congestive heart failure) (HCC)    Chronic kidney disease    BPH   Chronic systolic heart failure (HCC)    a. s/p MDT single chamber ICD 2004 as part of MASTER study b. upgrade to CRTD 2010; CRTD gen change 2016   Coronary artery disease    a. s/p anterior MI and CYPHER stent to LAD 2005   Dyslipidemia    Dysrhythmia    a-fib   Gout    Ischemic cardiomyopathy    Paroxysmal atrial fibrillation (HCC)    Ventricular tachycardia    Past Surgical History:  Procedure Laterality Date   APPENDECTOMY  1970   BI-VENTRICULAR IMPLANTABLE CARDIOVERTER DEFIBRILLATOR UPGRADE N/A 11/07/2014   a. MDT single chamber ICD implanted 2004 as part of MASTER study; upgrade to CRTD 2010; gen change 2016   CARDIAC CATHETERIZATION  09/08/2003   x3 total of 5 stents   CARDIOVERSION N/A 07/04/2019   Procedure: CARDIOVERSION;  Surgeon: Wendall Stade, MD;  Location: [facility] ENDOSCOPY;  Service: Cardiovascular;  Laterality: N/A;   CARDIOVERSION N/A 08/17/2019   Procedure: CARDIOVERSION;  Surgeon: Vesta Mixer, MD;  Location: [facility] ENDOSCOPY;  Service: Cardiovascular;   Laterality: N/A;   PROSTATECTOMY  11/06/2011   Procedure: PROSTATECTOMY SUPRAPUBIC;  Surgeon: Kathi Ludwig, MD;  Location: [facility] ORS;  Service: Urology;  Laterality: N/A;  Open Suprapubic Prostatectomy   TEE WITHOUT CARDIOVERSION N/A 05/16/2019   Procedure: TRANSESOPHAGEAL ECHOCARDIOGRAM (TEE);  Surgeon: Sande Rives, MD;  Location[facility] ENDOSCOPY;  Service: Cardiology;  Laterality: N/A;   TOTAL KNEE ARTHROPLASTY Left 10/06/2021   Procedure: TOTAL KNEE ARTHROPLASTY;  Surgeon: Jodi Geralds, MD;  Location: [facility] ORS;  Service: Orthopedics;  Laterality: Left;    Current Outpatient Medications  Medication Sig Dispense Refill   allopurinol (ZYLOPRIM) 100 MG tablet Take 100 mg by mouth daily.     amiodarone (PACERONE) 200 MG tablet Take 0.5 tablets (100 mg total) by mouth daily. 45 tablet 3   calcium carbonate (TUMS - DOSED IN MG ELEMENTAL CALCIUM) 500 MG chewable tablet Chew 1 tablet by mouth daily as needed for indigestion or heartburn.     carvedilol (COREG) 3.125 MG tablet TAKE 1 TABLET TWICE DAILY 180 tablet 2   ELIQUIS 5 MG TABS tablet TAKE 1 TABLET TWICE DAILY 180 tablet 1   furosemide (LASIX) 40 MG tablet Take 1 tablet (40 mg total) by mouth daily. 90 tablet 3   HYDROcodone-acetaminophen (NORCO) 5-325 MG tablet Take 1-2 tablets by mouth every 6 (six) hours as needed for moderate pain. 40 tablet 0   latanoprost (XALATAN) 0.005 % ophthalmic solution Place 1 drop  into both eyes at bedtime.      levothyroxine (SYNTHROID) 125 MCG tablet Take 1 tablet (125 mcg total) by mouth daily before breakfast. 84 tablet 0   nitroGLYCERIN (NITROSTAT) 0.4 MG SL tablet Place 1 tablet (0.4 mg total) under the tongue every 5 (five) minutes as needed for chest pain. 30 tablet 3   Polyethyl Glycol-Propyl Glycol 0.4-0.3 % SOLN Place 1 drop into both eyes daily.     potassium chloride (KLOR-CON) 10 MEQ tablet Take 1 tablet (10 mEq total) by mouth daily. 90 tablet 1   rosuvastatin (CRESTOR) 10 MG tablet TAKE  ONE-HALF TABLET BY  MOUTH DAILY 45 tablet 3   tiZANidine (ZANAFLEX) 2 MG tablet Take 1 tablet (2 mg total) by mouth every 8 (eight) hours as needed for muscle spasms. 40 tablet 0   No current facility-administered medications for this visit.    Allergies:   Atorvastatin   Social History: Social History   Socioeconomic History   Marital status: Married    Spouse name: Not on file   Number of children: Not on file   Years of education: Not on file   Highest education level: Not on file  Occupational History   Not on file  Tobacco Use   Smoking status: Never    Passive exposure: Never   Smokeless tobacco: Never  Vaping Use   Vaping Use: Never used  Substance and Sexual Activity   Alcohol use: No    Comment: occasional beer    Drug use: No   Sexual activity: Not on file  Other Topics Concern   Not on file  Social History Narrative   Not on file   Social Determinants of Health   Financial Resource Strain: Not on file  Food Insecurity: Not on file  Transportation Needs: Not on file  Physical Activity: Not on file  Stress: Not on file  Social Connections: Not on file  Intimate Partner Violence: Not on file    Family History: Family History  Problem Relation Age of Onset   Heart disease Sister        3 26f the 4 had HD   Heart disease Brother        2 of 3 had HD   Heart disease Sister        1 of the 6 has HD   Heart disease Brother     Review of Systems: All other systems reviewed and are otherwise negative except as noted above.   Physical Exam: Vitals:   10/22/21 1119  BP: 122/66  Pulse: 68  SpO2: 95%  Weight: 233 lb (105.7 kg)  Height: 5\' 11"  (1.803 m)     GEN- The patient is well appearing, alert and oriented x 3 today.   HEENT: normocephalic, atraumatic; sclera clear, conjunctiva pink; hearing intact; oropharynx clear; neck supple, no JVP Lymph- no cervical lymphadenopathy Lungs- Clear to ausculation bilaterally, normal work of breathing.  No  wheezes, rales, rhonchi Heart- Regular rate and rhythm, no murmurs, rubs or gallops, PMI not laterally displaced GI- soft, non-tender, non-distended, bowel sounds present, no hepatosplenomegaly Extremities- no clubbing or cyanosis. No edema; DP/PT/radial pulses 2+ bilaterally MS- no significant deformity or atrophy Skin- warm and dry, no rash or lesion; ICD pocket well healed Psych- euthymic mood, full affect Neuro- strength and sensation are intact  ICD interrogation- reviewed in detail today,  See PACEART report  EKG:  EKG is ordered today. Personal review of EKG ordered today shows V paced at 68  bpm, intermittent loss of LV capture.   Recent Labs: 09/29/2021: ALT 18; TSH 1.630 10/01/2021: BUN 20; Creatinine, Ser 1.20; Potassium 4.2; Sodium 136 10/07/2021: Hemoglobin 13.4; Platelets 208   Wt Readings from Last 3 Encounters:  10/22/21 233 lb (105.7 kg)  10/06/21 230 lb (104.3 kg)  10/01/21 230 lb (104.3 kg)     Other studies Reviewed: Additional studies/ records that were reviewed today include: Previous EP office notes.   Assessment and Plan:  Atrial fibrillation persistent    Ischemic cardiomyopathy and prior stenting   Hypotension precluding ACE/ARB/ASNI/MRA   Orthostatic weakness-- without evidence of orthostatic hypotension   Implantable defibrillator [employer]   Normal function for device at ERI as of 10/01/21. At previous visit with Dr. Caryl Comes, discussed the possibility of replacing his MDT generator with a [facility] [employer] device as "it does not use a doubler" and would result in prolonged battery life.   Pt is willing to consider BSx generator in effort to prolong battery.   Of note, his LV amplitude is again at threshold of 2.25V @ 1.5 ms. Amplitude changed to 2.75V @ 1.5 ms  Current medicines are reviewed at length with the patient today.    Labs/ tests ordered today include:  Orders Placed This Encounter  Procedures   Basic metabolic panel   CBC   EKG  12-Lead   Disposition:   Follow up with Dr. Caryl Comes as usual post gen change    Signed, [name] Friar, PA-C  10/22/2021 11:37 AM  [facility] [address] [place] [address] [phone] (office) [phone] (fax)

## 2021-10-22 NOTE — Patient Instructions (Signed)
Medication Instructions: Your physician recommends that you continue on your current medications as directed. Please refer to the Current Medication list given to you today.  Labwork: Your physician has recommended that you have lab work today: BMET and CBC   Procedures/Testing: Your physician has recommended that you have a Generator Change of your device on 11/12/2021. This is a procedure that replaces a Pacemaker ICD generator that is at the end of its service life. The remaining lifespan of a pacemaker is determined during visits to the [facility]. The battery in a pacemaker does not stop suddenly but rather loses its charge slowly, which lets the cardiologist plan the replacement date.  Follow-Up: Your physician recommends that you schedule a follow-up appointment in 10 - 14 days from 11/12/2021 with the Device clinic for a wound check  Your physician recommends that you schedule a follow-up appointment in 3 months from 11/12/2021 with Dr. Caryl Comes.   If you need a refill on your cardiac medications before your next appointment, please call your pharmacy.   -------------------------------------------------------------------------------------------------------------  Please wash with the CHG Soap the night before and morning of procedure (follow instruction page "Preparing For Surgery").   Please report to the [facility] at 6:30am ([place], [place] [id number])  DO NOT eat or drink anything after midnight the night before procedure  Hold all of your morning medications the day of your procedure.  **HOLD ELIQUIS 2 DAYS PRIOR TO PROCEDURE, LAST DOSE WILL BE ON 3/5 IN THE PM**   You will need someone to drive you home after the procedure ---------------------------------------------------------------------------------------- [facility] - Preparing For Surgery  Before surgery, you can play an important role. Because  skin is not sterile, your skin needs to be as free of germs as possible. You can reduce the number of germs on your skin by washing with CHG (chlorahexidine gluconate) Soap before surgery.  CHG is an antiseptic cleaner which kills germs and bonds with the skin to continue killing germs even after washing.   Please do not use if you have an allergy to CHG or antibacterial soaps.  If your skin becomes reddened/irritated stop using the CHG.   Do not shave (including legs and underarms) for at least 48 hours prior to first CHG shower.  It is OK to shave your face.  Please follow these instructions carefully:  1.  Shower the night before surgery and the morning of surgery with CHG.  2.  If you choose to wash your hair, wash your hair first as usual with your normal shampoo.  3.  After you shampoo, rinse your hair and body thoroughly to remove the shampoo.  4.  Use CHG as you would any other liquid soap.  You can apply CHG directly to the skin and wash gently with a clean washcloth. 5.  Apply the CHG Soap to your body ONLY FROM THE NECK DOWN.  Do not use on open wounds or open sores.  Avoid contact with your eyes, ears, mouth and genitals (private parts).  Wash genitals (private parts) with your normal soap.  6.  Wash thoroughly, paying special attention to the area where your surgery will be performed.  7.  Thoroughly rinse your body with warm water from the neck down.   8.  DO NOT shower/wash with your normal soap after using and rinsing off the CHG soap.  9.  Pat yourself dry with a clean towel.  10.  Wear clean pajamas.           11.  Place clean sheets on your bed the night of your first shower and do not sleep with pets.  Day of Surgery: Do not apply any deodorants/lotions.  Please wear clean clothes to the hospital/surgery center.

## 2021-10-22 NOTE — Progress Notes (Signed)
° ° °Electrophysiology Office Note °Date: 10/22/2021 ° °ID:  [name], DOB 09/06/1941, MRN [id number] ° °PCP: Elkins, Wilson Oliver, MD °Primary Cardiologist: Philip Nahser, MD °Electrophysiologist: Steven Klein, MD  ° °CC: Routine ICD follow-up ° °[name] is a 80 y.o. male seen today for Steven Klein, MD for acute visit due to Device at [facility] .  Since last being seen in our clinic the patient reports doing well overall.  he denies chest pain, palpitations, dyspnea, PND, orthopnea, nausea, vomiting, dizziness, syncope, edema, weight gain, or early satiety. He has not had ICD shocks.  ° °Device History: °MDT CRT-D, gen change 11/07/2014 °RA lead 2010 °RV lead 2004 °LV lead 2011 ° °Past Medical History:  °Diagnosis Date  ° AICD (automatic cardioverter/defibrillator) present   ° Arthritis   ° knees, back   ° BPH (benign prostatic hypertrophy)   ° CHF (congestive heart failure) (HCC)   ° Chronic kidney disease   ° BPH  ° Chronic systolic heart failure (HCC)   ° a. s/p MDT single chamber ICD 2004 as part of MASTER study b. upgrade to CRTD 2010; CRTD gen change 2016  ° Coronary artery disease   ° a. s/p anterior MI and CYPHER stent to LAD 2005  ° Dyslipidemia   ° Dysrhythmia   ° a-fib  ° Gout   ° Ischemic cardiomyopathy   ° Paroxysmal atrial fibrillation (HCC)   ° Ventricular tachycardia   ° °Past Surgical History:  °Procedure Laterality Date  ° APPENDECTOMY  1970  ° BI-VENTRICULAR IMPLANTABLE CARDIOVERTER DEFIBRILLATOR UPGRADE N/A 11/07/2014  ° a. MDT single chamber ICD implanted 2004 as part of MASTER study; upgrade to CRTD 2010; gen change 2016  ° CARDIAC CATHETERIZATION  09/08/2003  ° x3 total of 5 stents  ° CARDIOVERSION N/A 07/04/2019  ° Procedure: CARDIOVERSION;  Surgeon: Nishan, Peter C, MD;  Location: [facility];  Service: Cardiovascular;  Laterality: N/A;  ° CARDIOVERSION N/A 08/17/2019  ° Procedure: CARDIOVERSION;  Surgeon: Nahser, Philip J, MD;  Location: [facility];  Service: Cardiovascular;   Laterality: N/A;  ° PROSTATECTOMY  11/06/2011  ° Procedure: PROSTATECTOMY SUPRAPUBIC;  Surgeon: Sigmund I Tannenbaum, MD;  Location: [facility];  Service: Urology;  Laterality: N/A;  Open Suprapubic Prostatectomy  ° TEE WITHOUT CARDIOVERSION N/A 05/16/2019  ° Procedure: TRANSESOPHAGEAL ECHOCARDIOGRAM (TEE);  Surgeon: O'Neal, Belleville Thomas, MD;  Location: [facility];  Service: Cardiology;  Laterality: N/A;  ° TOTAL KNEE ARTHROPLASTY Left 10/06/2021  ° Procedure: TOTAL KNEE ARTHROPLASTY;  Surgeon: Graves, John, MD;  Location: [facility];  Service: Orthopedics;  Laterality: Left;  ° ° °Current Outpatient Medications  °Medication Sig Dispense Refill  ° allopurinol (ZYLOPRIM) 100 MG tablet Take 100 mg by mouth daily.    ° amiodarone (PACERONE) 200 MG tablet Take 0.5 tablets (100 mg total) by mouth daily. 45 tablet 3  ° calcium carbonate (TUMS - DOSED IN MG ELEMENTAL CALCIUM) 500 MG chewable tablet Chew 1 tablet by mouth daily as needed for indigestion or heartburn.    ° carvedilol (COREG) 3.125 MG tablet TAKE 1 TABLET TWICE DAILY 180 tablet 2  ° ELIQUIS 5 MG TABS tablet TAKE 1 TABLET TWICE DAILY 180 tablet 1  ° furosemide (LASIX) 40 MG tablet Take 1 tablet (40 mg total) by mouth daily. 90 tablet 3  ° HYDROcodone-acetaminophen (NORCO) 5-325 MG tablet Take 1-2 tablets by mouth every 6 (six) hours as needed for moderate pain. 40 tablet 0  ° latanoprost (XALATAN) 0.005 % ophthalmic solution Place 1 drop   into both eyes at bedtime.      levothyroxine (SYNTHROID) 125 MCG tablet Take 1 tablet (125 mcg total) by mouth daily before breakfast. 84 tablet 0   nitroGLYCERIN (NITROSTAT) 0.4 MG SL tablet Place 1 tablet (0.4 mg total) under the tongue every 5 (five) minutes as needed for chest pain. 30 tablet 3   Polyethyl Glycol-Propyl Glycol 0.4-0.3 % SOLN Place 1 drop into both eyes daily.     potassium chloride (KLOR-CON) 10 MEQ tablet Take 1 tablet (10 mEq total) by mouth daily. 90 tablet 1   rosuvastatin (CRESTOR) 10 MG tablet TAKE  ONE-HALF TABLET BY  MOUTH DAILY 45 tablet 3   tiZANidine (ZANAFLEX) 2 MG tablet Take 1 tablet (2 mg total) by mouth every 8 (eight) hours as needed for muscle spasms. 40 tablet 0   No current facility-administered medications for this visit.    Allergies:   Atorvastatin   Social History: Social History   Socioeconomic History   Marital status: Married    Spouse name: Not on file   Number of children: Not on file   Years of education: Not on file   Highest education level: Not on file  Occupational History   Not on file  Tobacco Use   Smoking status: Never    Passive exposure: Never   Smokeless tobacco: Never  Vaping Use   Vaping Use: Never used  Substance and Sexual Activity   Alcohol use: No    Comment: occasional beer    Drug use: No   Sexual activity: Not on file  Other Topics Concern   Not on file  Social History Narrative   Not on file   Social Determinants of Health   Financial Resource Strain: Not on file  Food Insecurity: Not on file  Transportation Needs: Not on file  Physical Activity: Not on file  Stress: Not on file  Social Connections: Not on file  Intimate Partner Violence: Not on file    Family History: Family History  Problem Relation Age of Onset   Heart disease Sister        3 64f the 4 had HD   Heart disease Brother        2 of 3 had HD   Heart disease Sister        1 of the 6 has HD   Heart disease Brother     Review of Systems: All other systems reviewed and are otherwise negative except as noted above.   Physical Exam: Vitals:   10/22/21 1119  BP: 122/66  Pulse: 68  SpO2: 95%  Weight: 233 lb (105.7 kg)  Height: 5\' 11"  (1.803 m)     GEN- The patient is well appearing, alert and oriented x 3 today.   HEENT: normocephalic, atraumatic; sclera clear, conjunctiva pink; hearing intact; oropharynx clear; neck supple, no JVP Lymph- no cervical lymphadenopathy Lungs- Clear to ausculation bilaterally, normal work of breathing.  No  wheezes, rales, rhonchi Heart- Regular rate and rhythm, no murmurs, rubs or gallops, PMI not laterally displaced GI- soft, non-tender, non-distended, bowel sounds present, no hepatosplenomegaly Extremities- no clubbing or cyanosis. No edema; DP/PT/radial pulses 2+ bilaterally MS- no significant deformity or atrophy Skin- warm and dry, no rash or lesion; ICD pocket well healed Psych- euthymic mood, full affect Neuro- strength and sensation are intact  ICD interrogation- reviewed in detail today,  See PACEART report  EKG:  EKG is ordered today. Personal review of EKG ordered today shows V paced at 68  bpm, intermittent loss of LV capture.   Recent Labs: 09/29/2021: ALT 18; TSH 1.630 10/01/2021: BUN 20; Creatinine, Ser 1.20; Potassium 4.2; Sodium 136 10/07/2021: Hemoglobin 13.4; Platelets 208   Wt Readings from Last 3 Encounters:  10/22/21 233 lb (105.7 kg)  10/06/21 230 lb (104.3 kg)  10/01/21 230 lb (104.3 kg)     Other studies Reviewed: Additional studies/ records that were reviewed today include: Previous EP office notes.   Assessment and Plan:  Atrial fibrillation persistent    Ischemic cardiomyopathy and prior stenting   Hypotension precluding ACE/ARB/ASNI/MRA   Orthostatic weakness-- without evidence of orthostatic hypotension   Implantable defibrillator [employer]   Normal function for device at [facility] as of 10/01/21. At previous visit with Dr. Caryl Comes, discussed the possibility of replacing his MDT generator with a [facility] [employer] device as "it does not use a doubler" and would result in prolonged battery life.   Pt is willing to consider BSx generator in effort to prolong battery.   Of note, his LV amplitude is again at threshold of 2.25V @ 1.5 ms. Amplitude changed to 2.75V @ 1.5 ms  Current medicines are reviewed at length with the patient today.    Labs/ tests ordered today include:  Orders Placed This Encounter  Procedures   Basic metabolic panel   CBC   EKG  12-Lead   Disposition:   Follow up with Dr. Caryl Comes as usual post gen change    Signed, [name] Friar, PA-C  10/22/2021 11:37 AM  [facility] [address] [place] [address] [phone] (office) [phone] (fax)

## 2021-11-03 ENCOUNTER — Ambulatory Visit (INDEPENDENT_AMBULATORY_CARE_PROVIDER_SITE_OTHER): Payer: Medicare PPO

## 2021-11-03 DIAGNOSIS — Z95810 Presence of automatic (implantable) cardiac defibrillator: Secondary | ICD-10-CM

## 2021-11-03 DIAGNOSIS — I5022 Chronic systolic (congestive) heart failure: Secondary | ICD-10-CM

## 2021-11-05 NOTE — Progress Notes (Signed)
EPIC Encounter for ICM Monitoring  Patient Name: [name] is a 81 y.o. male Date: 11/05/2021 Primary Care Physican: Kaleen Mask, MD Primary Cardiologist: Nahser Electrophysiologist: Joycelyn Schmid Pacing: 99%        09/29/2021 Office Weight:  236 lbs                                                           Spoke with patient and heart failure questions reviewed.  Pt asymptomatic for fluid accumulation.  Reports feeling well at this time and voices no complaints.  Battery replacement scheduled for 3/8.   Optivol thoracic impedance suggesting normal fluid levels.   Prescribed: Furosemide 40 mg take 1 tablet every Monday, Wed and Friday Potassium 10 mEq ke 1 tablet every Monday, Wed and Friday   Labs: 10/22/2021 Creatinine 1.15, BUN 15, Potassium 4.6, Sodium 139 10/01/2021 Creatinine 1.20, BUN 20, Potassium 4.2, Sodium 136, GFR >60  A complete set of results can be found in Results Review.   Recommendations:  No changes and encouraged to call if experiencing any fluid symptoms.   Follow-up plan: ICM clinic phone appointment on 12/22/2021.   91 day device clinic remote transmission 12/01/2021.     EP/Cardiology Office Visits:  02/17/2022 with Dr Graciela Husbands.   Copy of ICM check sent to Dr. Graciela Husbands.    3 month ICM trend: 11/03/2021.    12-14 Month ICM trend:     Karie Soda, RN 11/05/2021 4:17 PM

## 2021-11-11 NOTE — Pre-Procedure Instructions (Signed)
Attempted to call patient regarding procedure instructions.  Answering machine full. ?

## 2021-11-12 ENCOUNTER — Encounter (HOSPITAL_COMMUNITY): Payer: Self-pay | Admitting: Internal Medicine

## 2021-11-12 ENCOUNTER — Ambulatory Visit (HOSPITAL_COMMUNITY)
Admission: RE | Admit: 2021-11-12 | Discharge: 2021-11-12 | Disposition: A | Discharge status: Home / Self Care | Payer: Medicare PPO | Attending: Internal Medicine | Admitting: Internal Medicine

## 2021-11-12 ENCOUNTER — Ambulatory Visit (HOSPITAL_COMMUNITY)
Admission: RE | Disposition: A | Discharge status: Home / Self Care | Payer: Medicare PPO | Source: Home / Self Care | Attending: Internal Medicine

## 2021-11-12 ENCOUNTER — Other Ambulatory Visit: Payer: Self-pay

## 2021-11-12 DIAGNOSIS — Z955 Presence of coronary angioplasty implant and graft: Secondary | ICD-10-CM | POA: Diagnosis not present

## 2021-11-12 DIAGNOSIS — I5022 Chronic systolic (congestive) heart failure: Secondary | ICD-10-CM | POA: Diagnosis not present

## 2021-11-12 DIAGNOSIS — Z4502 Encounter for adjustment and management of automatic implantable cardiac defibrillator: Secondary | ICD-10-CM | POA: Insufficient documentation

## 2021-11-12 DIAGNOSIS — I959 Hypotension, unspecified: Secondary | ICD-10-CM | POA: Insufficient documentation

## 2021-11-12 DIAGNOSIS — Z79899 Other long term (current) drug therapy: Secondary | ICD-10-CM | POA: Insufficient documentation

## 2021-11-12 DIAGNOSIS — Z7901 Long term (current) use of anticoagulants: Secondary | ICD-10-CM | POA: Insufficient documentation

## 2021-11-12 DIAGNOSIS — E785 Hyperlipidemia, unspecified: Secondary | ICD-10-CM | POA: Insufficient documentation

## 2021-11-12 DIAGNOSIS — I255 Ischemic cardiomyopathy: Secondary | ICD-10-CM | POA: Insufficient documentation

## 2021-11-12 DIAGNOSIS — Z79890 Hormone replacement therapy (postmenopausal): Secondary | ICD-10-CM | POA: Diagnosis not present

## 2021-11-12 DIAGNOSIS — I4819 Other persistent atrial fibrillation: Secondary | ICD-10-CM | POA: Insufficient documentation

## 2021-11-12 DIAGNOSIS — M109 Gout, unspecified: Secondary | ICD-10-CM | POA: Diagnosis not present

## 2021-11-12 DIAGNOSIS — I252 Old myocardial infarction: Secondary | ICD-10-CM | POA: Diagnosis not present

## 2021-11-12 DIAGNOSIS — N189 Chronic kidney disease, unspecified: Secondary | ICD-10-CM | POA: Diagnosis not present

## 2021-11-12 DIAGNOSIS — I2510 Atherosclerotic heart disease of native coronary artery without angina pectoris: Secondary | ICD-10-CM | POA: Diagnosis not present

## 2021-11-12 HISTORY — PX: BIV ICD GENERATOR CHANGEOUT: EP1194

## 2021-11-12 SURGERY — BIV ICD GENERATOR CHANGEOUT

## 2021-11-12 MED ORDER — SODIUM CHLORIDE 0.9 % IR SOLN
Status: DC | PRN
  Administered 2021-11-12: 250 mL

## 2021-11-12 MED ORDER — SODIUM CHLORIDE 0.9 % IV SOLN
80.0000 mg | INTRAVENOUS | Status: AC
  Administered 2021-11-12: 80 mg

## 2021-11-12 MED ORDER — SODIUM CHLORIDE 0.9 % IV SOLN: INTRAVENOUS | Status: AC

## 2021-11-12 MED ORDER — MIDAZOLAM HCL 5 MG/5ML IJ SOLN
INTRAMUSCULAR | Status: DC | PRN
  Administered 2021-11-12 (×3): 1 mg via INTRAVENOUS

## 2021-11-12 MED ORDER — CEFAZOLIN SODIUM-DEXTROSE 2-4 GM/100ML-% IV SOLN
2.0000 g | INTRAVENOUS | Status: AC
  Administered 2021-11-12: 2 g via INTRAVENOUS

## 2021-11-12 MED ORDER — ACETAMINOPHEN 325 MG PO TABS
325.0000 mg | ORAL_TABLET | ORAL | Status: DC | PRN
  Filled 2021-11-12: qty 2

## 2021-11-12 MED ORDER — CEPHALEXIN 500 MG PO CAPS: 500.0000 mg | ORAL_CAPSULE | Freq: Four times a day (QID) | ORAL | 0 refills | Status: DC

## 2021-11-12 MED ORDER — SODIUM CHLORIDE 0.9 % IV SOLN
INTRAVENOUS | Status: AC
  Filled 2021-11-12: qty 2

## 2021-11-12 MED ORDER — CHLORHEXIDINE GLUCONATE 4 % EX LIQD: 4.0000 "application " | Freq: Once | CUTANEOUS | Status: DC

## 2021-11-12 MED ORDER — HEPARIN (PORCINE) IN NACL 1000-0.9 UT/500ML-% IV SOLN
INTRAVENOUS | Status: AC
  Filled 2021-11-12: qty 500

## 2021-11-12 MED ORDER — SODIUM CHLORIDE 0.9 % IV SOLN: INTRAVENOUS | Status: DC

## 2021-11-12 MED ORDER — POVIDONE-IODINE 10 % EX SWAB
2.0000 | Freq: Once | CUTANEOUS | Status: DC
Start: 2021-11-12 — End: 2021-11-12

## 2021-11-12 MED ORDER — MIDAZOLAM HCL 5 MG/5ML IJ SOLN
INTRAMUSCULAR | Status: AC
  Filled 2021-11-12: qty 5

## 2021-11-12 MED ORDER — CEFAZOLIN SODIUM-DEXTROSE 2-4 GM/100ML-% IV SOLN
INTRAVENOUS | Status: AC
  Filled 2021-11-12: qty 100

## 2021-11-12 MED ORDER — FENTANYL CITRATE (PF) 100 MCG/2ML IJ SOLN
INTRAMUSCULAR | Status: DC | PRN
  Administered 2021-11-12 (×3): 25 ug via INTRAVENOUS

## 2021-11-12 MED ORDER — FENTANYL CITRATE (PF) 100 MCG/2ML IJ SOLN
INTRAMUSCULAR | Status: AC
  Filled 2021-11-12: qty 2

## 2021-11-12 SURGICAL SUPPLY — 9 items
CABLE SURGICAL S-101-97-12 (CABLE) ×1 IMPLANT
DEVICE DISSECT PLASMABLAD 3.0S (MISCELLANEOUS) IMPLANT
HEMOSTAT SURGICEL 2X4 FIBR (HEMOSTASIS) ×1 IMPLANT
ICD MOMENTUM G125 (ICD Generator) ×1 IMPLANT
PAD DEFIB RADIO PHYSIO CONN (PAD) ×1 IMPLANT
PLASMABLADE 3.0S (MISCELLANEOUS) ×2
POUCH AIGIS-R ANTIBACT ICD (Mesh General) ×2 IMPLANT
POUCH AIGIS-R ANTIBACT ICD LRG (Mesh General) IMPLANT
TRAY PACEMAKER INSERTION (PACKS) ×1 IMPLANT

## 2021-11-12 NOTE — Progress Notes (Addendum)
Dr Graciela Husbands in to see client and checked client's left knee post-op site and advised client he would be starting client on antibiotic. Dr Graciela Husbands also notified redness noted left upper chest and no new orders noted ?

## 2021-11-12 NOTE — Interval H&P Note (Signed)
History and Physical Interval Note: ? ?11/12/2021 ?7:57 AM ? ?[name]  has presented today for surgery, with the diagnosis of eri.  The various methods of treatment have been discussed with the patient and family. After consideration of risks, benefits and other options for treatment, the patient has consented to  Procedure(s): ?BIV ICD GENERATOR CHANGEOUT (N/A) as a surgical intervention.  The patient's history has been reviewed, patient examined, no change in status, stable for surgery.  I have reviewed the patient's chart and labs.  Questions were answered to the patient's satisfaction.   ? ? ?Virl Axe ? ? ?

## 2021-11-13 MED FILL — Heparin Sod (Porcine)-NaCl IV Soln 1000 Unit/500ML-0.9%: INTRAVENOUS | Qty: 500 | Status: AC

## 2021-11-17 ENCOUNTER — Encounter: Payer: Self-pay | Admitting: Internal Medicine

## 2021-11-17 LAB — AEROBIC/ANAEROBIC CULTURE W GRAM STAIN (SURGICAL/DEEP WOUND)
Culture: NO GROWTH
Gram Stain: NONE SEEN

## 2021-11-18 ENCOUNTER — Telehealth: Payer: Self-pay | Admitting: Internal Medicine

## 2021-11-18 MED ORDER — DOXYCYCLINE HYCLATE 100 MG PO CAPS: ORAL_CAPSULE | ORAL | 0 refills | Status: DC

## 2021-11-18 NOTE — Telephone Encounter (Signed)
Duke Salvia, MD  ?11/18/2021  4:32 PM EDT   ?  ?Please inform the patient that the bacteria there is thought by the infectious disease team to possibly represent a real infection.  The antimicrobial pouch that we implanted should have antibiotic coverage sufficient but the recommendation was to put him on doxycycline 100 mg twice a day for 4 weeks.  In the event that there is redness or tenderness that develops please let us know that might well require [place] to remove the device.  ? ?

## 2021-11-18 NOTE — Telephone Encounter (Signed)
I called and spoke with the patient's daughter, [name] (ok per DPR), regarding wound culture results and Dr. Odessa Fleming recommendations as stated below to: ? ?1) START doxycycline 100 mg BID x 4 weeks ?2) Please contact our office ASAP if they notice redness/ tenderness to the generator change site that is new or different than what he has experienced since he came home- daughter reports some redness/ tenderness, but what is described sound as though this is typically what we see post procedure. She is aware if he develops a fever at all, they should call as well.  ? ?The patient will keep his appointment on 11/26/21 with the Device Clinic for a wound check. ? ?He currently has ~ 3 days left on Keflex. ?I advised to finish out they day today on Keflex, then he can start the doxycycline dosing as above tomorrow. ? ?[name] voices understanding of the above results and recommendations and is agreeable.  ? ?  ? ?

## 2021-11-26 ENCOUNTER — Ambulatory Visit (INDEPENDENT_AMBULATORY_CARE_PROVIDER_SITE_OTHER): Payer: Medicare PPO

## 2021-11-26 ENCOUNTER — Other Ambulatory Visit: Payer: Self-pay

## 2021-11-26 DIAGNOSIS — I255 Ischemic cardiomyopathy: Secondary | ICD-10-CM | POA: Diagnosis not present

## 2021-11-26 LAB — CUP PACEART INCLINIC DEVICE CHECK
Battery Remaining Longevity: 102 mo
Brady Statistic RA Percent Paced: 82 %
Brady Statistic RV Percent Paced: 97 %
Date Time Interrogation Session: 20230322193545
HighPow Impedance: 47 Ohm
Implantable Lead Implant Date: 20040315
Implantable Lead Implant Date: 20100611
Implantable Lead Implant Date: 20100611
Implantable Lead Location: 753858
Implantable Lead Location: 753859
Implantable Lead Location: 753860
Implantable Lead Model: 4196
Implantable Lead Model: 5076
Implantable Lead Model: 6947
Implantable Pulse Generator Implant Date: 20230308
Lead Channel Impedance Value: 449 Ohm
Lead Channel Impedance Value: 510 Ohm
Lead Channel Impedance Value: 549 Ohm
Lead Channel Pacing Threshold Amplitude: 0.7 V
Lead Channel Pacing Threshold Amplitude: 0.7 V
Lead Channel Pacing Threshold Amplitude: 2 V
Lead Channel Pacing Threshold Pulse Width: 0.4 ms
Lead Channel Pacing Threshold Pulse Width: 0.4 ms
Lead Channel Pacing Threshold Pulse Width: 1.5 ms
Lead Channel Sensing Intrinsic Amplitude: 2.4 mV
Lead Channel Sensing Intrinsic Amplitude: 22 mV
Lead Channel Sensing Intrinsic Amplitude: 25 mV
Lead Channel Setting Pacing Amplitude: 1.5 V
Lead Channel Setting Pacing Amplitude: 2 V
Lead Channel Setting Pacing Amplitude: 2.7 V
Lead Channel Setting Pacing Pulse Width: 0.4 ms
Lead Channel Setting Pacing Pulse Width: 1.5 ms
Lead Channel Setting Sensing Sensitivity: 0.5 mV
Lead Channel Setting Sensing Sensitivity: 1 mV
Pulse Gen Serial Number: 152344

## 2021-11-26 NOTE — Progress Notes (Signed)
Wound check appointment s/p CRT-D gen change 11/12/21. Steri-strips removed. Wound without redness. Large hematoma noted. Assessed by Charlcie Cradle, PA. Patient to hold eliquis until return appointment 12/01/21 and instructed to notify device clinic if hematoma worsens. Of note patient on multiple antibiotics for left knee infection and prophylactic for gen change. Incision edges approximated, wound well healed. Normal device function. RA, RV tresholds, sensing, and impedances consistent with implant measurements. LV threshold elevated at 3.3 at 0.4. Consulted with Industry who advises to extend pulsewidt to 1.5 and program 2.7V/1.53ms. Device programmed for chronic leads. Histogram distribution appropriate for patient and level of activity. No mode switches or ventricular arrhythmias noted. Patient educated about wound care, arm mobility, lifting restrictions, shock plan. Patient enrolled in remote monitoring with next transmission scheduled 02/12/22. 91 day follow up with Dr. Caryl Comes 02/17/22. ? ? ? ? ? ?

## 2021-11-26 NOTE — Patient Instructions (Addendum)
? ?  After Your ICD ?(Implantable Cardiac Defibrillator) ? ? ? ?Monitor your defibrillator site for redness, swelling, and drainage. Call the device clinic at [phone] if you experience these symptoms or fever/chills. ? ?Your incision was closed with Dermabond:  You may shower and wash your incision with soap and water. Avoid lotions, ointments, or perfums over your incision until it is well-healed.   ?You may use a hot tub or a pool after your wound check appointment if the incision is completely closed. ? ?Do not lift, push or pull greater than 10 pounds with the affected arm until 6 weeks after your procedure. There are no other restrictions in arm movement after your wound check appointment. ? ?Your ICD is not MRI compatible. ? ?Your ICD is designed to protect you from life threatening heart rhythms. Because of this, you may receive a shock.  ? ?1 shock with no symptoms:  Call the office during business hours. ?1 shock with symptoms (chest pain, chest pressure, dizziness, lightheadedness, shortness of breath, overall feeling unwell):  Call 911. ?If you experience 2 or more shocks in 24 hours:  Call 911. ?If you receive a shock, you should not drive.  ?[place] DMV - no driving for 6 months if you receive appropriate therapy from your ICD.  ? ?ICD Alerts:  Some alerts are vibratory and others beep. These are NOT emergencies. Please call our office to let us know. If this occurs at night or on weekends, it can wait until the next business day. Send a remote transmission. ? ?If your device is capable of reading fluid status (for heart failure), you will be offered monthly monitoring to review this with you.  ? ?Remote monitoring is used to monitor your ICD from home. This monitoring is scheduled every 91 days by our office. It allows [place] to keep an eye on the functioning of your device to ensure it is working properly. You will routinely see your Electrophysiologist annually (more often if necessary).  ?

## 2021-12-01 ENCOUNTER — Other Ambulatory Visit: Payer: Self-pay

## 2021-12-01 ENCOUNTER — Encounter: Payer: Medicare PPO | Admitting: Student

## 2021-12-01 ENCOUNTER — Ambulatory Visit (INDEPENDENT_AMBULATORY_CARE_PROVIDER_SITE_OTHER): Payer: Medicare PPO

## 2021-12-01 DIAGNOSIS — I255 Ischemic cardiomyopathy: Secondary | ICD-10-CM

## 2021-12-01 NOTE — Progress Notes (Signed)
Patient seen in device clinic for wound recheck. Per Dr. Lovena Le, patient is to continue to hold eliquis. Would like for patient to come back in end of next week. Apt made 12/11/21 @ 3:20 pm, Dr. Caryl Comes will be in office at that time. ? ?Patient reports hematoma has not changed in size. No redness noted. Edges well approximated.  ? ? ?

## 2021-12-01 NOTE — Patient Instructions (Addendum)
Please continue to monitor your site. If you see swelling increasing in size, redness or drainage, call the device clinic. ? ?Also, continue to HOLD your eliquis at this time and we will readdress at your next appointment.  ? ?[facility] ?[phone] ?

## 2021-12-02 ENCOUNTER — Other Ambulatory Visit: Payer: Self-pay | Admitting: Physician Assistant

## 2021-12-11 ENCOUNTER — Ambulatory Visit (INDEPENDENT_AMBULATORY_CARE_PROVIDER_SITE_OTHER): Payer: Medicare PPO

## 2021-12-11 DIAGNOSIS — I255 Ischemic cardiomyopathy: Secondary | ICD-10-CM

## 2021-12-11 NOTE — Patient Instructions (Signed)
Please leave pocket pal in place as much as you can. Use the freezer pack and be sure to place it between the pocket pal strap and sleeve. If you see increased swelling, any bleeding, drainage, fever or chills, call the device clinic. ? ?DO NOT TAKE ELIQUIS AT THIS TIME. Will discuss at next visit with Dr. Graciela Husbands on 12/19/21. ? ?Device Clinic ?[phone] ?

## 2021-12-11 NOTE — Progress Notes (Signed)
Patient seen in device clinic for wound recheck. Dr. Caryl Comes in to see patient and verbal order to place pocket pal. Pocket pal education completed with patient verbal understanding. Patient lives alone but will call his family to help him when he needs assistance.  ? ?Please leave pocket pal in place as much as you can. Use the freezer pack and be sure to place it between the pocket pal strap and sleeve. If you see increased swelling, any bleeding, drainage, fever or chills, call the device clinic. ? ?DO NOT TAKE ELIQUIS AT THIS TIME. Will discuss at next visit with Dr. Caryl Comes on 12/19/21. ?

## 2021-12-19 ENCOUNTER — Ambulatory Visit (INDEPENDENT_AMBULATORY_CARE_PROVIDER_SITE_OTHER): Payer: Medicare PPO

## 2021-12-19 DIAGNOSIS — Z95810 Presence of automatic (implantable) cardiac defibrillator: Secondary | ICD-10-CM

## 2021-12-19 NOTE — Patient Instructions (Signed)
Resume Eliquis at normal dose on 12/20/21  ? ?Call Device clinic at [phone] for increased swelling, new bruising or if existing swelling becomes firm to touch. ?

## 2021-12-19 NOTE — Progress Notes (Addendum)
Wound re-check, no new bruising site soft to touch. Per SK patient to resume Eliquis on 12/20/21.  ? ? ? ?

## 2022-01-19 ENCOUNTER — Ambulatory Visit (INDEPENDENT_AMBULATORY_CARE_PROVIDER_SITE_OTHER): Payer: Medicare PPO

## 2022-01-19 DIAGNOSIS — I5022 Chronic systolic (congestive) heart failure: Secondary | ICD-10-CM | POA: Diagnosis not present

## 2022-01-19 DIAGNOSIS — Z95810 Presence of automatic (implantable) cardiac defibrillator: Secondary | ICD-10-CM

## 2022-01-21 ENCOUNTER — Telehealth: Payer: Self-pay

## 2022-01-21 NOTE — Progress Notes (Signed)
EPIC Encounter for ICM Monitoring ? ?Patient Name: [name] is a 81 y.o. male ?Date: 01/21/2022 ?Primary Care Physican: Kaleen Mask, MD ?Primary Cardiologist: Nahser ?Electrophysiologist: Graciela Husbands ?Last Weight: 236 lbs ?Right Ventricular:   99% ?Left Ventricular:     99%       ? ?Attempted call to patient and unable to reach.  Left detailed message per DPR regarding transmission. Transmission reviewed.  ?  ?[employer] Heart Failure Index is 1 suggesting normal fluid levels ? ?Prescribed: ?Furosemide 40 mg take 1 tablet by mouth daily ?Potassium 10 mEq ke 1 tablet by mouth daily ?  ?Labs: ?10/22/2021 Creatinine 1.15, BUN 15, Potassium 4.6, Sodium 139 ?10/01/2021 Creatinine 1.20, BUN 20, Potassium 4.2, Sodium 136, GFR >60  ?A complete set of results can be found in Results Review. ?  ?Recommendations:  Left voice mail with ICM number and encouraged to call if experiencing any fluid symptoms. ?  ?Follow-up plan: ICM clinic phone appointment on 02/23/2022.   91 day device clinic remote transmission 02/12/2022.   ?  ?EP/Cardiology Office Visits:  02/17/2022 with Dr Graciela Husbands. ?  ?Copy of ICM check sent to Dr. Graciela Husbands.  ? ?3 Month Trend ? ? ? ?8 Day Data Trend ?       ? ? ?Karie Soda, RN ?01/21/2022 ?7:44 AM ?

## 2022-01-21 NOTE — Telephone Encounter (Signed)
Remote ICM transmission received.  Attempted call to patient regarding ICM remote transmission and left detailed message per DPR.  Advised to return call for any fluid symptoms or questions. Next ICM remote transmission scheduled 02/23/2022.    

## 2022-02-12 ENCOUNTER — Ambulatory Visit (INDEPENDENT_AMBULATORY_CARE_PROVIDER_SITE_OTHER): Payer: Medicare PPO

## 2022-02-12 DIAGNOSIS — I255 Ischemic cardiomyopathy: Secondary | ICD-10-CM

## 2022-02-12 LAB — CUP PACEART REMOTE DEVICE CHECK
Battery Remaining Longevity: 114 mo
Battery Remaining Percentage: 100 %
Brady Statistic RA Percent Paced: 88 %
Brady Statistic RV Percent Paced: 98 %
Date Time Interrogation Session: 20230608072500
HighPow Impedance: 48 Ohm
Implantable Lead Implant Date: 20040315
Implantable Lead Implant Date: 20100611
Implantable Lead Implant Date: 20100611
Implantable Lead Location: 753858
Implantable Lead Location: 753859
Implantable Lead Location: 753860
Implantable Lead Model: 4196
Implantable Lead Model: 5076
Implantable Lead Model: 6947
Implantable Pulse Generator Implant Date: 20230308
Lead Channel Impedance Value: 465 Ohm
Lead Channel Impedance Value: 503 Ohm
Lead Channel Impedance Value: 545 Ohm
Lead Channel Pacing Threshold Amplitude: 0.6 V
Lead Channel Pacing Threshold Amplitude: 0.7 V
Lead Channel Pacing Threshold Amplitude: 2.6 V
Lead Channel Pacing Threshold Pulse Width: 0.4 ms
Lead Channel Pacing Threshold Pulse Width: 0.4 ms
Lead Channel Pacing Threshold Pulse Width: 1.5 ms
Lead Channel Setting Pacing Amplitude: 1.5 V
Lead Channel Setting Pacing Amplitude: 2 V
Lead Channel Setting Pacing Amplitude: 2.7 V
Lead Channel Setting Pacing Pulse Width: 0.4 ms
Lead Channel Setting Pacing Pulse Width: 1.5 ms
Lead Channel Setting Sensing Sensitivity: 0.5 mV
Lead Channel Setting Sensing Sensitivity: 1 mV
Pulse Gen Serial Number: 152344

## 2022-02-17 ENCOUNTER — Encounter: Payer: Self-pay | Admitting: Internal Medicine

## 2022-02-17 ENCOUNTER — Telehealth: Payer: Self-pay | Admitting: Internal Medicine

## 2022-02-17 ENCOUNTER — Ambulatory Visit (INDEPENDENT_AMBULATORY_CARE_PROVIDER_SITE_OTHER): Payer: Medicare PPO | Admitting: Internal Medicine

## 2022-02-17 VITALS — BP 114/66 | HR 60 | Ht 71.0 in | Wt 233.8 lb

## 2022-02-17 DIAGNOSIS — I4819 Other persistent atrial fibrillation: Secondary | ICD-10-CM | POA: Diagnosis not present

## 2022-02-17 DIAGNOSIS — I5022 Chronic systolic (congestive) heart failure: Secondary | ICD-10-CM

## 2022-02-17 DIAGNOSIS — Z95810 Presence of automatic (implantable) cardiac defibrillator: Secondary | ICD-10-CM | POA: Diagnosis not present

## 2022-02-17 DIAGNOSIS — I255 Ischemic cardiomyopathy: Secondary | ICD-10-CM

## 2022-02-17 DIAGNOSIS — Z79899 Other long term (current) drug therapy: Secondary | ICD-10-CM

## 2022-02-17 MED ORDER — SPIRONOLACTONE 25 MG PO TABS: 12.5000 mg | ORAL_TABLET | Freq: Every day | ORAL | 3 refills | Status: DC

## 2022-02-17 NOTE — Telephone Encounter (Signed)
Pt's medication was sent to pt's pharmacy as requested. Confirmation received.  °

## 2022-02-17 NOTE — Patient Instructions (Addendum)
Medication Instructions:  Your physician has recommended you make the following change in your medication:   ** Begin Spironolactone 25mg  - 1/2 tablet by mouth at bedtime  ** Increase your Furosemide to 80mg  - 2 tablets by mouth daily x 5 doses then return to your normal dosing of 40mg  - 1 tablet daily  *If you need a refill on your cardiac medications before your next appointment, please call your pharmacy*   Lab Work: CBC and BMET in 2 weeks 03/03/2022 - you may come anytime bettween 8am and 430pm that day.  If you have labs (blood work) drawn today and your tests are completely normal, you will receive your results only by: [place] (if you have MyChart) OR A paper copy in the mail If you have any lab test that is abnormal or we need to change your treatment, we will call you to review the results.   Testing/Procedures: None ordered.    Follow-Up: At [facility], you and your health needs are our priority.  As part of our continuing mission to provide you with exceptional heart care, we have created designated Provider Care Teams.  These Care Teams include your primary Cardiologist (physician) and Advanced Practice Providers (APPs -  Physician Assistants and Nurse Practitioners) who all work together to provide you with the care you need, when you need it.  We recommend signing up for the patient portal called "MyChart".  Sign up information is provided on this After Visit Summary.  MyChart is used to connect with patients for Virtual Visits (Telemedicine).  Patients are able to view lab/test results, encounter notes, upcoming appointments, etc.  Non-urgent messages can be sent to your provider as well.   To learn more about what you can do with MyChart, go to [email or web address].    Your next appointment:   4-6 weeks with Dr Caryl Comes - 03/31/2022 at [facility]

## 2022-02-17 NOTE — Progress Notes (Signed)
Patient ID: [name], male   DOB: 1941-08-02, 81 y.o.   MRN: [id number]     Patient Care Team: Kaleen Mask, MD as PCP - General Nahser, Deloris Ping, MD as PCP - Cardiology (Cardiology) Duke Salvia, MD as PCP - Electrophysiology (Cardiology) Marcello Fennel, PA-C as Physician Assistant (Urology)   HPI  [name] is a 81 y.o. male seen in followup for  CRT-D upgrade for congestive heart failure in the setting of ischemic heart disease with device generator replacement  2/16; 3/23.  Atrial fib on apixoban and amiodarone. no bleeding  Patient denies symptoms of respiratory, GI intolerance, sun sensitivity, neurological symptoms attributable to amiodarone.      The patient denies chest pain, nocturnal dyspnea, orthopnea .  There have been no palpitation, lightheadedness or syncope.  Complains of lightheadedness in the past 2/2 entresto and spiro together.     He spends more time inside the house than outside since his wife died 84; childrens are struggling with related concerns    wife -[name]       DATE TEST EF%   1/10 Echo   30-35 %   3/17 Echo  35-40 %   9/20 Echo 25-30%   9/20 TEE 25-30%   3/21 Echo 30-35%   4/21 Myoview  23% Fixed perfusion defect-Anteroseptal No ischemia   Date Cr K Hgb TSH ALT  7/19 1.02 4.2 15.3]    11/20 1.27 4.6 15.8    9/21  1.08 4.4     2/22 1.23 4.2 15.3 8.36   9/22 1.11 4.3 16.2 6.48 (7/22) 18  2/23 1.15 4.3 13.4            Wife passed away in 2016-01-06   Past Medical History:  Diagnosis Date   AICD (automatic cardioverter/defibrillator) present    Arthritis    knees, back    BPH (benign prostatic hypertrophy)    CHF (congestive heart failure) (HCC)    Chronic kidney disease    BPH   Chronic systolic heart failure (HCC)    a. s/p MDT single chamber ICD 2003-01-06 as part of MASTER study b. upgrade to CRTD 2009-01-05; CRTD gen change 01-06-2015   Coronary artery disease    a. s/p anterior MI and CYPHER stent to LAD 2004/01/06    Dyslipidemia    Dysrhythmia    a-fib   Gout    Ischemic cardiomyopathy    Paroxysmal atrial fibrillation (HCC)    Ventricular tachycardia     Past Surgical History:  Procedure Laterality Date   APPENDECTOMY  1970   BI-VENTRICULAR IMPLANTABLE CARDIOVERTER DEFIBRILLATOR UPGRADE N/A 11/07/2014   a. MDT single chamber ICD implanted 01/06/03 as part of MASTER study; upgrade to CRTD 01-05-2009; gen change Jan 06, 2015   BIV ICD GENERATOR CHANGEOUT N/A 11/12/2021   Procedure: BIV ICD GENERATOR CHANGEOUT;  Surgeon: Duke Salvia, MD;  Location: [facility] INVASIVE CV LAB;  Service: Cardiovascular;  Laterality: N/A;   CARDIAC CATHETERIZATION  09/08/2003   x3 total of 5 stents   CARDIOVERSION N/A 07/04/2019   Procedure: CARDIOVERSION;  Surgeon: Wendall Stade, MD;  Location: [facility] ENDOSCOPY;  Service: Cardiovascular;  Laterality: N/A;   CARDIOVERSION N/A 08/17/2019   Procedure: CARDIOVERSION;  Surgeon: Vesta Mixer, MD;  Location: [facility] ENDOSCOPY;  Service: Cardiovascular;  Laterality: N/A;   PROSTATECTOMY  11/06/2011   Procedure: PROSTATECTOMY SUPRAPUBIC;  Surgeon: Kathi Ludwig, MD;  Location: [facility] ORS;  Service: Urology;  Laterality: N/A;  Open Suprapubic Prostatectomy  TEE WITHOUT CARDIOVERSION N/A 05/16/2019   Procedure: TRANSESOPHAGEAL ECHOCARDIOGRAM (TEE);  Surgeon: Geralynn Rile, MD;  Location: [place];  Service: Cardiology;  Laterality: N/A;   TOTAL KNEE ARTHROPLASTY Left 10/06/2021   Procedure: TOTAL KNEE ARTHROPLASTY;  Surgeon: Dorna Leitz, MD;  Location: [facility]S;  Service: Orthopedics;  Laterality: Left;    Current Outpatient Medications  Medication Sig Dispense Refill   allopurinol (ZYLOPRIM) 100 MG tablet Take 100 mg by mouth daily.     amiodarone (PACERONE) 200 MG tablet TAKE 1/2 TABLET EVERY DAY 45 tablet 1   calcium carbonate (TUMS - DOSED IN MG ELEMENTAL CALCIUM) 500 MG chewable tablet Chew 1 tablet by mouth daily as needed for indigestion or heartburn.     carvedilol (COREG) 3.125  MG tablet TAKE 1 TABLET TWICE DAILY 180 tablet 2   doxycycline (VIBRAMYCIN) 100 MG capsule Take 1 capsule (100 mg) by mouth twice daily x 4 weeks 56 capsule 0   ELIQUIS 5 MG TABS tablet TAKE 1 TABLET TWICE DAILY 180 tablet 1   furosemide (LASIX) 40 MG tablet Take 1 tablet (40 mg total) by mouth daily. 90 tablet 3   HYDROcodone-acetaminophen (NORCO) 5-325 MG tablet Take 1-2 tablets by mouth every 6 (six) hours as needed for moderate pain. 40 tablet 0   latanoprost (XALATAN) 0.005 % ophthalmic solution Place 1 drop into both eyes at bedtime.      levothyroxine (SYNTHROID) 125 MCG tablet Take 125 mcg by mouth daily before breakfast.     nitroGLYCERIN (NITROSTAT) 0.4 MG SL tablet Place 1 tablet (0.4 mg total) under the tongue every 5 (five) minutes as needed for chest pain. 30 tablet 3   Polyethyl Glycol-Propyl Glycol 0.4-0.3 % SOLN Place 1 drop into both eyes daily.     potassium chloride (KLOR-CON) 10 MEQ tablet Take 1 tablet (10 mEq total) by mouth daily. 90 tablet 1   rosuvastatin (CRESTOR) 10 MG tablet TAKE ONE-HALF TABLET BY  MOUTH DAILY 45 tablet 3   tiZANidine (ZANAFLEX) 2 MG tablet Take 1 tablet (2 mg total) by mouth every 8 (eight) hours as needed for muscle spasms. 40 tablet 0   No current facility-administered medications for this visit.    Allergies  Allergen Reactions   Atorvastatin Other (See Comments)    MUSCLE ACHE    Physical Exam: BP 114/66   Pulse 60   Ht 5\' 11"  (1.803 m)   Wt 233 lb 12.8 oz (106.1 kg)   SpO2 92%   BMI 32.61 kg/m   Well developed and well nourished in no acute distress HENT normal Neck supple   Clear Device pocket well healed; without hematoma or erythema.  There is no tethering  Regular rate and rhythm, no murmur Abd-soft with active BS No Clubbing cyanosis 2+ edema Skin-warm and dry A & Oriented  Grossly normal sensory and motor function  ECG P synchronous pacing with a QR in lead I and RS in lead V1  ECG AV pacing w intermittent BIV pacing  competing with RV pacing    Assessment and  Plan:  Atrial fibrillation persistent   Ischemic cardiomyopathy and prior stenting  Hypotension precluding ACE/ARB/ASNI/MRA  Orthostatic weakness-- without evidence of orthostatic hypotension  Implantable defibrillator [employer]   Hemoglobin lower   Device generator replacement well-tolerated.  LV threshold remains elevated, hence, a Chemical engineer device was implanted.  Programmed at 0.3 V above threshold today.  Still with some edema.  We will increase his furosemide from 40--80 daily x5 days.  In the past, has not tolerated Entresto nor did he tolerate spironolactone in conjunction with the [facility]; we will try the Spiriva by itself at 12.5 mg and have him take it at night.  We will check a metabolic profile in a couple of weeks we will see him again in the office in 4-6 weeks and at that time if we can tolerate it we will begin an SGLT2  His hemoglobin has been persistently lower, we will recheck a CBC.  Continue him on his Eliquis at his current dosing.  I have also given him the information for [email or web address]

## 2022-02-17 NOTE — Telephone Encounter (Signed)
*  STAT* If patient is at the pharmacy, call can be transferred to refill team.   1. Which medications need to be refilled? (please list name of each medication and dose if known)  spironolactone (ALDACTONE) 25 MG tablet  2. Which pharmacy/location (including street and city if local pharmacy) is medication to be sent to? [facility] - [place], [place] - [address]   3. Do they need a 30 day or 90 day supply?  90 day supply  Patient states [facility] does not have Spironolactone and he would like to have the current Rx transferred to [facility].

## 2022-02-20 NOTE — Progress Notes (Signed)
Remote ICD transmission.   

## 2022-02-23 ENCOUNTER — Ambulatory Visit (INDEPENDENT_AMBULATORY_CARE_PROVIDER_SITE_OTHER): Payer: Medicare PPO

## 2022-02-23 DIAGNOSIS — I5022 Chronic systolic (congestive) heart failure: Secondary | ICD-10-CM | POA: Diagnosis not present

## 2022-02-23 DIAGNOSIS — Z95810 Presence of automatic (implantable) cardiac defibrillator: Secondary | ICD-10-CM

## 2022-02-24 NOTE — Progress Notes (Signed)
EPIC Encounter for ICM Monitoring  Patient Name: [name] is a 81 y.o. male Date: 02/24/2022 Primary Care Physican: Kaleen Mask, MD Primary Cardiologist: Nahser Electrophysiologist: Graciela Husbands 02/17/2022 Office Weight:     233 lbs Right Ventricular:   96% Left Ventricular:     99%                                                                                     Spoke with patient and heart failure questions reviewed.  Pt asymptomatic for fluid accumulation.  Reports feeling well at this time and voices no complaints.    HeartLogic Heart Failure Index is 7 suggesting normal fluid levels.   Prescribed: Furosemide 40 mg take 1 tablet by mouth daily Potassium 10 mEq ke 1 tablet by mouth daily Spironolactone 25 mg take 0.5 tablet (12.5 mg total) daily at bedtime   Labs: 10/22/2021 Creatinine 1.15, BUN 15, Potassium 4.6, Sodium 139 10/01/2021 Creatinine 1.20, BUN 20, Potassium 4.2, Sodium 136, GFR >60  A complete set of results can be found in Results Review.   Recommendations: No changes and encouraged to call if experiencing any fluid symptoms.   Follow-up plan: ICM clinic phone appointment on 04/06/2022.   91 day device clinic remote transmission 05/14/2022.     EP/Cardiology Office Visits:  03/31/2022 with Dr Graciela Husbands.   Copy of ICM check sent to Dr. Graciela Husbands.   3 Month Trend    8 Day Data Trend          Karie Soda, RN 02/24/2022 9:06 AM

## 2022-03-03 ENCOUNTER — Other Ambulatory Visit: Payer: Medicare PPO

## 2022-03-03 DIAGNOSIS — I255 Ischemic cardiomyopathy: Secondary | ICD-10-CM

## 2022-03-03 DIAGNOSIS — I5022 Chronic systolic (congestive) heart failure: Secondary | ICD-10-CM

## 2022-03-03 DIAGNOSIS — Z95810 Presence of automatic (implantable) cardiac defibrillator: Secondary | ICD-10-CM

## 2022-03-03 DIAGNOSIS — Z79899 Other long term (current) drug therapy: Secondary | ICD-10-CM

## 2022-03-03 DIAGNOSIS — I4819 Other persistent atrial fibrillation: Secondary | ICD-10-CM

## 2022-03-04 LAB — BASIC METABOLIC PANEL
BUN/Creatinine Ratio: 14 (ref 10–24)
BUN: 16 mg/dL (ref 8–27)
CO2: 23 mmol/L (ref 20–29)
Calcium: 9 mg/dL (ref 8.6–10.2)
Chloride: 102 mmol/L (ref 96–106)
Creatinine, Ser: 1.15 mg/dL (ref 0.76–1.27)
Glucose: 193 mg/dL — ABNORMAL HIGH (ref 70–99)
Potassium: 4.4 mmol/L (ref 3.5–5.2)
Sodium: 140 mmol/L (ref 134–144)
eGFR: 64 mL/min/{1.73_m2} (ref >59)

## 2022-03-04 LAB — CBC
Hematocrit: 44.1 % (ref 37.5–51.0)
Hemoglobin: 14.9 g/dL (ref 13.0–17.7)
MCH: 31.4 pg (ref 26.6–33.0)
MCHC: 33.8 g/dL (ref 31.5–35.7)
MCV: 93 fL (ref 79–97)
Platelets: 245 10*3/uL (ref 150–450)
RBC: 4.74 x10E6/uL (ref 4.14–5.80)
RDW: 13.2 % (ref 11.6–15.4)
WBC: 6.5 10*3/uL (ref 3.4–10.8)

## 2022-03-30 DIAGNOSIS — Z95810 Presence of automatic (implantable) cardiac defibrillator: Secondary | ICD-10-CM | POA: Insufficient documentation

## 2022-03-31 ENCOUNTER — Ambulatory Visit (INDEPENDENT_AMBULATORY_CARE_PROVIDER_SITE_OTHER): Payer: Medicare PPO | Admitting: Internal Medicine

## 2022-03-31 ENCOUNTER — Encounter: Payer: Self-pay | Admitting: Internal Medicine

## 2022-03-31 ENCOUNTER — Other Ambulatory Visit: Payer: Self-pay | Admitting: Cardiovascular Disease

## 2022-03-31 VITALS — BP 108/66 | HR 60 | Ht 71.0 in | Wt 232.6 lb

## 2022-03-31 DIAGNOSIS — I5022 Chronic systolic (congestive) heart failure: Secondary | ICD-10-CM | POA: Diagnosis not present

## 2022-03-31 DIAGNOSIS — I4819 Other persistent atrial fibrillation: Secondary | ICD-10-CM

## 2022-03-31 DIAGNOSIS — Z79899 Other long term (current) drug therapy: Secondary | ICD-10-CM

## 2022-03-31 DIAGNOSIS — I255 Ischemic cardiomyopathy: Secondary | ICD-10-CM

## 2022-03-31 DIAGNOSIS — Z95810 Presence of automatic (implantable) cardiac defibrillator: Secondary | ICD-10-CM | POA: Diagnosis not present

## 2022-03-31 LAB — CUP PACEART INCLINIC DEVICE CHECK
Battery Remaining Longevity: 108 mo
Brady Statistic RA Percent Paced: 92 %
Date Time Interrogation Session: 20230725163904
Implantable Lead Implant Date: 20040315
Implantable Lead Implant Date: 20100611
Implantable Lead Implant Date: 20100611
Implantable Lead Location: 753858
Implantable Lead Location: 753859
Implantable Lead Location: 753860
Implantable Lead Model: 4196
Implantable Lead Model: 5076
Implantable Lead Model: 6947
Implantable Pulse Generator Implant Date: 20230308
Lead Channel Pacing Threshold Amplitude: 0.6 V
Lead Channel Pacing Threshold Amplitude: 0.7 V
Lead Channel Pacing Threshold Amplitude: 2.7 V
Lead Channel Pacing Threshold Pulse Width: 0.4 ms
Lead Channel Pacing Threshold Pulse Width: 0.4 ms
Lead Channel Pacing Threshold Pulse Width: 1.5 ms
Lead Channel Sensing Intrinsic Amplitude: 23.3 mV
Lead Channel Sensing Intrinsic Amplitude: 23.8 mV
Pulse Gen Serial Number: 152344

## 2022-03-31 MED ORDER — EMPAGLIFLOZIN 10 MG PO TABS: ORAL_TABLET | ORAL | 1 refills | Status: DC

## 2022-03-31 NOTE — Progress Notes (Signed)
Patient ID: [name], male   DOB: 16-Sep-1940, 81 y.o.   MRN: [id number]     Patient Care Team: Kaleen Mask, MD as PCP - General Nahser, Deloris Ping, MD as PCP - Cardiology (Cardiology) Duke Salvia, MD as PCP - Electrophysiology (Cardiology) Marcello Fennel, PA-C as Physician Assistant (Urology)   HPI  [name] is a 81 y.o. male seen in followup for  CRT-D upgrade for congestive heart failure in the setting of ischemic heart disease with device generator replacement  2/16; 3/23.  Persistent atrial fib on apixoban and amiodarone. no bleeding  Patient denies symptoms of respiratory, GI intolerance, sun sensitivity, neurological symptoms attributable to amiodarone.          The patient denies chest pain, nocturnal dyspnea, orthopnea or peripheral edema.  There have been no palpitations or syncope.  Complains of lightheadedness with resumption of spironolactone.  No presyncope.  Mild dyspnea..   He spends more time inside the house than outside since his wife died 89; childrens are struggling with related concerns    wife -[name]       DATE TEST EF%   1/10 Echo   30-35 %   3/17 Echo  35-40 %   9/20 Echo 25-30%   9/20 TEE 25-30%   3/21 Echo 30-35%   4/21 Myoview  23% Fixed perfusion defect-Anteroseptal No ischemia   Date Cr K Hgb TSH ALT  7/19 1.02 4.2 15.3]    11/20 1.27 4.6 15.8    9/21  1.08 4.4     2/22 1.23 4.2 15.3 8.36   9/22 1.11 4.3 16.2 6.48 (7/22) 18  2/23 1.15 4.3 13.4    6/ 23 1.15 4.4 14.9            Wife passed away in 31-Dec-2015   Past Medical History:  Diagnosis Date   AICD (automatic cardioverter/defibrillator) present    Arthritis    knees, back    BPH (benign prostatic hypertrophy)    CHF (congestive heart failure) (HCC)    Chronic kidney disease    BPH   Chronic systolic heart failure (HCC)    a. s/p MDT single chamber ICD 2002-12-31 as part of MASTER study b. upgrade to CRTD 12/30/2008; CRTD gen change 2014-12-31   Coronary artery disease     a. s/p anterior MI and CYPHER stent to LAD 12-31-2003   Dyslipidemia    Dysrhythmia    a-fib   Gout    Ischemic cardiomyopathy    Paroxysmal atrial fibrillation (HCC)    Ventricular tachycardia (HCC)     Past Surgical History:  Procedure Laterality Date   APPENDECTOMY  1970   BI-VENTRICULAR IMPLANTABLE CARDIOVERTER DEFIBRILLATOR UPGRADE N/A 11/07/2014   a. MDT single chamber ICD implanted 31-Dec-2002 as part of MASTER study; upgrade to CRTD 2008/12/30; gen change 2014-12-31   BIV ICD GENERATOR CHANGEOUT N/A 11/12/2021   Procedure: BIV ICD GENERATOR CHANGEOUT;  Surgeon: Duke Salvia, MD;  Location: [facility] INVASIVE CV LAB;  Service: Cardiovascular;  Laterality: N/A;   CARDIAC CATHETERIZATION  09/08/2003   x3 total of 5 stents   CARDIOVERSION N/A 07/04/2019   Procedure: CARDIOVERSION;  Surgeon: Wendall Stade, MD;  Location: [facility] ENDOSCOPY;  Service: Cardiovascular;  Laterality: N/A;   CARDIOVERSION N/A 08/17/2019   Procedure: CARDIOVERSION;  Surgeon: Vesta Mixer, MD;  Location: [facility] ENDOSCOPY;  Service: Cardiovascular;  Laterality: N/A;   PROSTATECTOMY  11/06/2011   Procedure: PROSTATECTOMY SUPRAPUBIC;  Surgeon: Kathi Ludwig, MD;  Location: [facility] OR[facility];  Service: Urology;  Laterality: N/A;  Open Suprapubic Prostatectomy   TEE WITHOUT CARDIOVERSION N/A 05/16/2019   Procedure: TRANSESOPHAGEAL ECHOCARDIOGRAM (TEE);  Surgeon: Sande Rives, MD;  Location: [facility] ENDOSCOPY;  Service: Cardiology;  Laterality: N/A;   TOTAL KNEE ARTHROPLASTY Left 10/06/2021   Procedure: TOTAL KNEE ARTHROPLASTY;  Surgeon: Jodi Geralds, MD;  Location: [facility];  Service: Orthopedics;  Laterality: Left;    Current Outpatient Medications  Medication Sig Dispense Refill   allopurinol (ZYLOPRIM) 100 MG tablet Take 100 mg by mouth daily.     amiodarone (PACERONE) 200 MG tablet TAKE 1/2 TABLET EVERY DAY 45 tablet 1   calcium carbonate (TUMS - DOSED IN MG ELEMENTAL CALCIUM) 500 MG chewable tablet Chew 1 tablet by mouth daily as needed  for indigestion or heartburn.     carvedilol (COREG) 3.125 MG tablet TAKE 1 TABLET TWICE DAILY 180 tablet 2   doxycycline (VIBRAMYCIN) 100 MG capsule Take 1 capsule (100 mg) by mouth twice daily x 4 weeks 56 capsule 0   ELIQUIS 5 MG TABS tablet TAKE 1 TABLET TWICE DAILY 180 tablet 1   furosemide (LASIX) 40 MG tablet Take 1 tablet (40 mg total) by mouth daily. 90 tablet 3   HYDROcodone-acetaminophen (NORCO) 5-325 MG tablet Take 1-2 tablets by mouth every 6 (six) hours as needed for moderate pain. 40 tablet 0   latanoprost (XALATAN) 0.005 % ophthalmic solution Place 1 drop into both eyes at bedtime.      levothyroxine (SYNTHROID) 125 MCG tablet Take 125 mcg by mouth daily before breakfast.     nitroGLYCERIN (NITROSTAT) 0.4 MG SL tablet Place 1 tablet (0.4 mg total) under the tongue every 5 (five) minutes as needed for chest pain. 30 tablet 3   Polyethyl Glycol-Propyl Glycol 0.4-0.3 % SOLN Place 1 drop into both eyes daily.     potassium chloride (KLOR-CON) 10 MEQ tablet Take 1 tablet (10 mEq total) by mouth daily. 90 tablet 1   rosuvastatin (CRESTOR) 10 MG tablet TAKE 1/2 TABLET EVERY DAY 15 tablet 1   spironolactone (ALDACTONE) 25 MG tablet Take 0.5 tablets (12.5 mg total) by mouth at bedtime. 45 tablet 3   tiZANidine (ZANAFLEX) 2 MG tablet Take 1 tablet (2 mg total) by mouth every 8 (eight) hours as needed for muscle spasms. 40 tablet 0   No current facility-administered medications for this visit.    Allergies  Allergen Reactions   Atorvastatin Other (See Comments)    MUSCLE ACHE    Physical Exam: BP 108/66   Pulse 60   Ht 5\' 11"  (1.803 m)   Wt 232 lb 9.6 oz (105.5 kg)   SpO2 93%   BMI 32.44 kg/m    Well developed and well nourished in no acute distress HENT normal Neck supple with JVP-flat Clear Device pocket well healed; without hematoma or erythema.  There is no tethering  Regular rate and rhythm, no  gallop No / murmur Abd-soft with active BS No Clubbing cyanosis  edema Skin-warm and dry A & Oriented  Grossly normal sensory and motor function  ECG AV pacing at 60 QRS morphology Rs lead V1 Qr lead I     Assessment and  Plan:  Atrial fibrillation persistent   Ischemic cardiomyopathy and prior stenting  Hypotension precluding ACE/ARB/ASNI/MRA reexpose 6/23  Orthostatic weakness-- without evidence of orthostatic hypotension  Implantable defibrillator [employer]-CRT with elevated LV threshold     LV threshold remains elevated; with a specific type device we can stay  half volts above threshold without needing to doubler  He did not tolerate spironolactone; we will discontinue it.  We will try him on Jardiance.  We will start at 5 mg first to see how he tolerates it.  If this works okay for the first week, then we will increase it to 10.  No angina.  Continue his Eliquis.  No interval atrial fibrillation.  Also continue on his Eliquis.  His hemoglobin is improved.  Continue amiodarone at 100 mg a day.  Needs amiodarone surveillance laboratories.  Euvolemic.  Continue furosemide 40.  Care again

## 2022-03-31 NOTE — Patient Instructions (Addendum)
Medication Instructions:  Your physician has recommended you make the following change in your medication:   ** Stop Spironolactone  ** Start Jardiance 10mg  - 1/2 tablet by mouth daily before breakfast.   *If you need a refill on your cardiac medications before your next appointment, please call your pharmacy*   Lab Work: TSH and Liver panel today  If you have labs (blood work) drawn today and your tests are completely normal, you will receive your results only by: MyChart Message (if you have MyChart) OR A paper copy in the mail If you have any lab test that is abnormal or we need to change your treatment, we will call you to review the results.   Testing/Procedures: None ordered.    Follow-Up: At [facility], you and your health needs are our priority.  As part of our continuing mission to provide you with exceptional heart care, we have created designated Provider Care Teams.  These Care Teams include your primary Cardiologist (physician) and Advanced Practice Providers (APPs -  Physician Assistants and Nurse Practitioners) who all work together to provide you with the care you need, when you need it.  We recommend signing up for the patient portal called "MyChart".  Sign up information is provided on this After Visit Summary.  MyChart is used to connect with patients for Virtual Visits (Telemedicine).  Patients are able to view lab/test results, encounter notes, upcoming appointments, etc.  Non-urgent messages can be sent to your provider as well.   To learn more about what you can do with MyChart, go to [facility].    Your next appointment:   2-3 week virtual visit with Dr [email or web address] -  Dr Graciela Husbands scheduler will call you to schedule this appointment.  Follow up with Dr Odessa Fleming as recommended September 2023  Dr October 2023 12 months  Important Information About Sugar

## 2022-04-01 LAB — HEPATIC FUNCTION PANEL
ALT: 35 IU/L (ref 0–44)
AST: 26 IU/L (ref 0–40)
Albumin: 3.9 g/dL (ref 3.7–4.7)
Alkaline Phosphatase: 108 IU/L (ref 44–121)
Bilirubin Total: 0.5 mg/dL (ref 0.0–1.2)
Bilirubin, Direct: 0.18 mg/dL (ref 0.00–0.40)
Total Protein: 6.5 g/dL (ref 6.0–8.5)

## 2022-04-01 LAB — TSH: TSH: 1.18 u[IU]/mL (ref 0.450–4.500)

## 2022-04-06 ENCOUNTER — Ambulatory Visit (INDEPENDENT_AMBULATORY_CARE_PROVIDER_SITE_OTHER): Payer: Medicare PPO

## 2022-04-06 DIAGNOSIS — I5022 Chronic systolic (congestive) heart failure: Secondary | ICD-10-CM | POA: Diagnosis not present

## 2022-04-06 DIAGNOSIS — Z95810 Presence of automatic (implantable) cardiac defibrillator: Secondary | ICD-10-CM

## 2022-04-09 ENCOUNTER — Telehealth: Payer: Self-pay

## 2022-04-09 NOTE — Telephone Encounter (Signed)
Remote ICM transmission received.  Attempted call to patient regarding ICM remote transmission and left message per DPR to return call.   

## 2022-04-09 NOTE — Progress Notes (Signed)
EPIC Encounter for ICM Monitoring  Patient Name: [name] is a 81 y.o. male Date: 04/09/2022 Primary Care Physican: Kaleen Mask, MD Primary Cardiologist: Nahser Electrophysiologist: Graciela Husbands 02/17/2022 Office Weight:     233 lbs Right Ventricular:   96% Left Ventricular:     99%                                                                                     Attempted call to patient and unable to reach.   Transmission reviewed.    HeartLogic Heart Failure Index is 0 suggesting normal fluid levels.   Prescribed: Furosemide 40 mg take 1 tablet by mouth daily Potassium 10 mEq ke 1 tablet by mouth daily Spironolactone 25 mg take 0.5 tablet (12.5 mg total) daily at bedtime   Labs: 10/22/2021 Creatinine 1.15, BUN 15, Potassium 4.6, Sodium 139 10/01/2021 Creatinine 1.20, BUN 20, Potassium 4.2, Sodium 136, GFR >60  A complete set of results can be found in Results Review.   Recommendations:  Unable to reach.     Follow-up plan: ICM clinic phone appointment on 05/12/2022.   91 day device clinic remote transmission 05/14/2022.     EP/Cardiology Office Visits:  04/20/2022 with Dr Graciela Husbands.  05/25/2022 with Dr Elease Hashimoto.   Copy of ICM check sent to Dr. Graciela Husbands.   3 Month Trend    8 Day Data Trend          Karie Soda, RN 04/09/2022 10:37 AM

## 2022-04-20 ENCOUNTER — Encounter: Payer: Self-pay | Admitting: Internal Medicine

## 2022-04-20 ENCOUNTER — Ambulatory Visit (INDEPENDENT_AMBULATORY_CARE_PROVIDER_SITE_OTHER): Payer: Medicare PPO | Admitting: Internal Medicine

## 2022-04-20 VITALS — Ht 71.0 in

## 2022-04-20 DIAGNOSIS — Z95810 Presence of automatic (implantable) cardiac defibrillator: Secondary | ICD-10-CM

## 2022-04-20 DIAGNOSIS — I5022 Chronic systolic (congestive) heart failure: Secondary | ICD-10-CM | POA: Diagnosis not present

## 2022-04-20 DIAGNOSIS — I255 Ischemic cardiomyopathy: Secondary | ICD-10-CM

## 2022-04-20 DIAGNOSIS — I4819 Other persistent atrial fibrillation: Secondary | ICD-10-CM | POA: Diagnosis not present

## 2022-04-20 MED ORDER — EMPAGLIFLOZIN 10 MG PO TABS: 10.0000 mg | ORAL_TABLET | Freq: Every day | ORAL | 2 refills | Status: DC

## 2022-04-20 NOTE — Progress Notes (Signed)
Electrophysiology TeleHealth Note   Due to national recommendations of social distancing due to COVID 19, an audio/video telehealth visit is felt to be most appropriate for this patient at this time.  See MyChart message from today for the patient's consent to telehealth for [facility].   Date:  04/20/2022   ID:  [name], DOB 08/18/1941, MRN [id number]  Location: patient's home  Provider location: [address] [place]  Evaluation Performed: Follow-up visit  PCP:  Kaleen Mask, MD  Cardiologist:   PNa Electrophysiologist:  SK   Chief Complaint:  dyspnea and afib   History of Present Illness:    [name] is a 81 y.o. male who presents via audio/video conferencing for a telehealth visit today.  Since last being seen in our clinic for  CRT-D upgrade for congestive heart failure in the setting of ischemic heart disease with device generator replacement  2/16; 3/23.   Persistent atrial fib on apixoban and amiodarone. no bleeding   Patient denies symptoms of respiratory, GI intolerance, sun sensitivity, neurological symptoms attributable to amiodarone.       Intolerant  of spiro 2/2 lightheadedness before       We trying low dose jardiance.      DATE TEST EF%    1/10 Echo   30-35 %    3/17 Echo  35-40 %    9/20 Echo 25-30%    9/20 TEE 25-30%    3/21 Echo 30-35%    4/21 Myoview  23% Fixed perfusion defect-Anteroseptal No ischemia    Date Cr K Hgb TSH ALT  7/19 1.02 4.2 15.3]      11/20 1.27 4.6 15.8      9/21  1.08 4.4        2/22 1.23 4.2 15.3 8.36    9/22 1.11 4.3 16.2 6.48 (7/22) 18  2/23 1.15 4.3 13.4      6/ 23 1.15 4.4 14.9  1.18 26        Past Medical History:  Diagnosis Date   AICD (automatic cardioverter/defibrillator) present    Arthritis    knees, back    BPH (benign prostatic hypertrophy)    CHF (congestive heart failure) (HCC)    Chronic kidney disease    BPH   Chronic systolic heart failure (HCC)    a.  s/p MDT single chamber ICD 2004 as part of MASTER study b. upgrade to CRTD 2010; CRTD gen change 2016   Coronary artery disease    a. s/p anterior MI and CYPHER stent to LAD 2005   Dyslipidemia    Dysrhythmia    a-fib   Gout    Ischemic cardiomyopathy    Paroxysmal atrial fibrillation (HCC)    Ventricular tachycardia (HCC)     Past Surgical History:  Procedure Laterality Date   APPENDECTOMY  1970   BI-VENTRICULAR IMPLANTABLE CARDIOVERTER DEFIBRILLATOR UPGRADE N/A 11/07/2014   a. MDT single chamber ICD implanted 2004 as part of MASTER study; upgrade to CRTD 2010; gen change 2016   BIV ICD GENERATOR CHANGEOUT N/A 11/12/2021   Procedure: BIV ICD GENERATOR CHANGEOUT;  Surgeon: Duke Salvia, MD;  Location: [facility] INVASIVE CV LAB;  Service: Cardiovascular;  Laterality: N/A;   CARDIAC CATHETERIZATION  09/08/2003   x3 total of 5 stents   CARDIOVERSION N/A 07/04/2019   Procedure: CARDIOVERSION;  Surgeon: Wendall Stade, MD;  Location: [facility] ENDOSCOPY;  Service: Cardiovascular;  Laterality: N/A;   CARDIOVERSION N/A 08/17/2019  Procedure: CARDIOVERSION;  Surgeon: Vesta Mixer, MD;  Location: [facility] ENDOSCOPY;  Service: Cardiovascular;  Laterality: N/A;   PROSTATECTOMY  11/06/2011   Procedure: PROSTATECTOMY SUPRAPUBIC;  Surgeon: Kathi Ludwig, MD;  Location: [facility] ORS;  Service: Urology;  Laterality: N/A;  Open Suprapubic Prostatectomy   TEE WITHOUT CARDIOVERSION N/A 05/16/2019   Procedure: TRANSESOPHAGEAL ECHOCARDIOGRAM (TEE);  Surgeon: Sande Rives, MD;  Location: [facility] ENDOSCOPY;  Service: Cardiology;  Laterality: N/A;   TOTAL KNEE ARTHROPLASTY Left 10/06/2021   Procedure: TOTAL KNEE ARTHROPLASTY;  Surgeon: Jodi Geralds, MD;  Location: [facility] ORS;  Service: Orthopedics;  Laterality: Left;    Current Outpatient Medications  Medication Sig Dispense Refill   allopurinol (ZYLOPRIM) 100 MG tablet Take 100 mg by mouth daily.     amiodarone (PACERONE) 200 MG tablet TAKE 1/2 TABLET EVERY DAY 45  tablet 1   calcium carbonate (TUMS - DOSED IN MG ELEMENTAL CALCIUM) 500 MG chewable tablet Chew 1 tablet by mouth daily as needed for indigestion or heartburn.     carvedilol (COREG) 3.125 MG tablet TAKE 1 TABLET TWICE DAILY 180 tablet 2   ELIQUIS 5 MG TABS tablet TAKE 1 TABLET TWICE DAILY 180 tablet 1   empagliflozin (JARDIANCE) 10 MG TABS tablet Take 1/2 tablet by mouth daily before breakfast 45 tablet 1   furosemide (LASIX) 40 MG tablet Take 1 tablet (40 mg total) by mouth daily. 90 tablet 3   HYDROcodone-acetaminophen (NORCO) 5-325 MG tablet Take 1-2 tablets by mouth every 6 (six) hours as needed for moderate pain. 40 tablet 0   latanoprost (XALATAN) 0.005 % ophthalmic solution Place 1 drop into both eyes at bedtime.      levothyroxine (SYNTHROID) 125 MCG tablet Take 125 mcg by mouth daily before breakfast.     nitroGLYCERIN (NITROSTAT) 0.4 MG SL tablet Place 1 tablet (0.4 mg total) under the tongue every 5 (five) minutes as needed for chest pain. 30 tablet 3   Polyethyl Glycol-Propyl Glycol 0.4-0.3 % SOLN Place 1 drop into both eyes daily.     potassium chloride (KLOR-CON) 10 MEQ tablet Take 1 tablet (10 mEq total) by mouth daily. 90 tablet 1   rosuvastatin (CRESTOR) 10 MG tablet TAKE 1/2 TABLET EVERY DAY 15 tablet 1   tiZANidine (ZANAFLEX) 2 MG tablet Take 1 tablet (2 mg total) by mouth every 8 (eight) hours as needed for muscle spasms. (Patient not taking: Reported on 04/20/2022) 40 tablet 0   No current facility-administered medications for this visit.    Allergies:   Atorvastatin   Social History:  The patient  reports that he has never smoked. He has never been exposed to tobacco smoke. He has never used smokeless tobacco. He reports that he does not drink alcohol and does not use drugs.   Family History:  The patient's   family history includes Heart disease in his brother, brother, sister, and sister.   ROS:  Please see the history of present illness.   All other systems are  personally reviewed and negative.    Exam:    Vital Signs:  Ht 5\' 11"  (1.803 m)   BMI 32.44 kg/m        Labs/Other Tests and Data Reviewed:    Recent Labs: 03/03/2022: BUN 16; Creatinine, Ser 1.15; Hemoglobin 14.9; Platelets 245; Potassium 4.4; Sodium 140 03/31/2022: ALT 35; TSH 1.180   Wt Readings from Last 3 Encounters:  03/31/22 232 lb 9.6 oz (105.5 kg)  02/17/22 233 lb 12.8 oz (106.1 kg)  11/12/21  230 lb (104.3 kg)         ASSESSMENT & PLAN:    Atrial fibrillation persistent    Ischemic cardiomyopathy and prior stenting   Hypotension precluding ACE/ARB/ASNI/MRA reexpose 6/23   Orthostatic weakness-- without evidence of orthostatic hypotension   Implantable defibrillator [employer]-CRT with elevated LV threshold  Tolerating jardiance at 5mg  daily; will increase to 10 mg daily  BP--120 tolerating without lightheadedness     COVID 19 screen The patient denies symptoms of COVID 19 at this time.  The importance of social distancing was discussed today.  Follow-up:  16m     Current medicines are reviewed at length with the patient today.   The patient  concerns regarding his medicines.  The following changes were made today:    Labs/ tests ordered today include:  No orders of the defined types were placed in this encounter.   Future tests ( post COVID )   in   months  Patient Risk:  after full review of this patients clinical status, I feel that they are at moderate risk at this time.  Today, I have spent 11 minutes with the patient with telehealth technology discussing the above.  Signed, 11m, MD  04/20/2022 2:09 PM     [facility] [address] [place] [phone] (office) [phone] (fax)

## 2022-04-20 NOTE — Patient Instructions (Signed)
Medication Instructions:  Your physician has recommended you make the following change in your medication:   ** Increase Jardiance 10mg  to 1 tablet by mouth daily before breakfast   *If you need a refill on your cardiac medications before your next appointment, please call your pharmacy*   Lab Work: None ordered.  If you have labs (blood work) drawn today and your tests are completely normal, you will receive your results only by: MyChart Message (if you have MyChart) OR A paper copy in the mail If you have any lab test that is abnormal or we need to change your treatment, we will call you to review the results.   Testing/Procedures: None ordered.    Follow-Up: At [facility], you and your health needs are our priority.  As part of our continuing mission to provide you with exceptional heart care, we have created designated Provider Care Teams.  These Care Teams include your primary Cardiologist (physician) and Advanced Practice Providers (APPs -  Physician Assistants and Nurse Practitioners) who all work together to provide you with the care you need, when you need it.  We recommend signing up for the patient portal called "MyChart".  Sign up information is provided on this After Visit Summary.  MyChart is used to connect with patients for Virtual Visits (Telemedicine).  Patients are able to view lab/test results, encounter notes, upcoming appointments, etc.  Non-urgent messages can be sent to your provider as well.   To learn more about what you can do with MyChart, go to [facility].    Your next appointment:   6 months with Dr [email or web address]  Important Information About Sugar

## 2022-04-21 ENCOUNTER — Other Ambulatory Visit: Payer: Self-pay

## 2022-04-21 MED ORDER — POTASSIUM CHLORIDE ER 10 MEQ PO TBCR: 10.0000 meq | EXTENDED_RELEASE_TABLET | Freq: Every day | ORAL | 3 refills | Status: DC

## 2022-04-29 ENCOUNTER — Telehealth: Payer: Self-pay | Admitting: Internal Medicine

## 2022-04-29 NOTE — Telephone Encounter (Signed)
Spoke to pt's dtr (dpr on file) Discussed w/ Megan, pharmD.  Advised that pt can try taking it w/ food to see if this relieves stomach pain or pt can decrease back  to 5mg  until hearing back from Dr. nurse. Aware his nurse will follow up once she returns to office. Dtr verbalized understanding and agreeable to plan.

## 2022-04-29 NOTE — Telephone Encounter (Signed)
Pt c/o medication issue:  1. Name of Medication: empagliflozin (JARDIANCE) 10 MG TABS tablet  2. How are you currently taking this medication (dosage and times per day)? Take 1 tablet (10 mg total) by mouth daily before breakfast.  3. Are you having a reaction (difficulty breathing--STAT)? no  4. What is your medication issue? His body not reacting to the medication well. Using the bathroom way more and his stomach is causing him pain. Please advise

## 2022-04-30 NOTE — Telephone Encounter (Signed)
Attempted phone call to pt's daughter and left voicemail message to contact RN at [phone]. 

## 2022-04-30 NOTE — Progress Notes (Signed)
Cardiology Office Note Date:  04/30/2022  Patient ID:  [name], [name] Oct 27, 1940, MRN [id number] PCP:  Kaleen Mask, MD  Cardiologist:  Dr. Elease Hashimoto EP: Dr. Graciela Husbands    Chief Complaint:  increased AFib burden  History of Present Illness: [name] is a 81 y.o. male with history of CAD, chronic CHF (systolic), ICD, BPH,  HLD, gout, AFib.  He has been seeing Dr. Graciela Husbands frequently of late,  June 13th, s/p gen change, volume on board and meds adjusted, mentions in the past, has not tolerated Entresto nor did he tolerate spironolactone in conjunction with the Entresto, and spironolactone was started alone, his lasix increased for 5 days.  Planned to try and add on SGLT2i at his next visit Note mentions intolerant of amiodarone with GI intolerance, sun sensitivity, and neuro symptoms  >> though is on low dose amio  03/31/22, did not tolerate spironolactone (unclear why), started on Jardiance 5mg  with plans to increase if able, planned for amio labs Discussed elevated LV threshold known, kept at 1/2V, no need for doubler with BSci.  04/20/22: tolerating jardiance with good BP, no orthostatic lightheadedness and increased to 20mg  daily  TODAY He is accompanied by his daughter He is feeling pretty awful, probably about the 15th-16th they recall because his power was out for several hours then. He is very fatigued, weak, not near syncopal. Gets lightheaded on standing, this is worse, but not new. He has had some CP the last couple days, locates to the epigastrium, sharp, non-radiating, not positional, has tried ASA and TUMS without relief, s/l NTG did help. None today   Recalls last year his rhythm corrected but 5 minutes later back out and they had to shock him again     Device information MDT CRT-D, gen change 11/07/2014, gen change 11/12/21 now with a BSci generator RA lead 2010 RV lead 2004 LV lead 2011  Known to have LV lead diaphragmatic stim that has been very difficult  to program around Known high LV threshold   Past Medical History:  Diagnosis Date   AICD (automatic cardioverter/defibrillator) present    Arthritis    knees, back    BPH (benign prostatic hypertrophy)    CHF (congestive heart failure) (HCC)    Chronic kidney disease    BPH   Chronic systolic heart failure (HCC)    a. s/p MDT single chamber ICD 2004 as part of MASTER study b. upgrade to CRTD 2010; CRTD gen change 2016   Coronary artery disease    a. s/p anterior MI and CYPHER stent to LAD 2005   Dyslipidemia    Dysrhythmia    a-fib   Gout    Ischemic cardiomyopathy    Paroxysmal atrial fibrillation (HCC)    Ventricular tachycardia (HCC)     Past Surgical History:  Procedure Laterality Date   APPENDECTOMY  1970   BI-VENTRICULAR IMPLANTABLE CARDIOVERTER DEFIBRILLATOR UPGRADE N/A 11/07/2014   a. MDT single chamber ICD implanted 2004 as part of MASTER study; upgrade to CRTD 2010; gen change 2016   BIV ICD GENERATOR CHANGEOUT N/A 11/12/2021   Procedure: BIV ICD GENERATOR CHANGEOUT;  Surgeon: 2017, MD;  Location: [facility] INVASIVE CV LAB;  Service: Cardiovascular;  Laterality: N/A;   CARDIAC CATHETERIZATION  09/08/2003   x3 total of 5 stents   CARDIOVERSION N/A 07/04/2019   Procedure: CARDIOVERSION;  Surgeon: 11/06/2003, MD;  Location: [facility] ENDOSCOPY;  Service: Cardiovascular;  Laterality: N/A;   CARDIOVERSION N/A 08/17/2019  Procedure: CARDIOVERSION;  Surgeon: Vesta Mixer, MD;  Location: [facility] ENDOSCOPY;  Service: Cardiovascular;  Laterality: N/A;   PROSTATECTOMY  11/06/2011   Procedure: PROSTATECTOMY SUPRAPUBIC;  Surgeon: Kathi Ludwig, MD;  Location: [facility] ORS;  Service: Urology;  Laterality: N/A;  Open Suprapubic Prostatectomy   TEE WITHOUT CARDIOVERSION N/A 05/16/2019   Procedure: TRANSESOPHAGEAL ECHOCARDIOGRAM (TEE);  Surgeon: Sande Rives, MD;  Location: [facility] ENDOSCOPY;  Service: Cardiology;  Laterality: N/A;   TOTAL KNEE ARTHROPLASTY Left 10/06/2021    Procedure: TOTAL KNEE ARTHROPLASTY;  Surgeon: Jodi Geralds, MD;  Location: [facility] ORS;  Service: Orthopedics;  Laterality: Left;    Current Outpatient Medications  Medication Sig Dispense Refill   allopurinol (ZYLOPRIM) 100 MG tablet Take 100 mg by mouth daily.     amiodarone (PACERONE) 200 MG tablet TAKE 1/2 TABLET EVERY DAY 45 tablet 1   calcium carbonate (TUMS - DOSED IN MG ELEMENTAL CALCIUM) 500 MG chewable tablet Chew 1 tablet by mouth daily as needed for indigestion or heartburn.     carvedilol (COREG) 3.125 MG tablet TAKE 1 TABLET TWICE DAILY 180 tablet 2   ELIQUIS 5 MG TABS tablet TAKE 1 TABLET TWICE DAILY 180 tablet 1   empagliflozin (JARDIANCE) 10 MG TABS tablet Take 1 tablet (10 mg total) by mouth daily before breakfast. 90 tablet 2   furosemide (LASIX) 40 MG tablet Take 1 tablet (40 mg total) by mouth daily. 90 tablet 3   HYDROcodone-acetaminophen (NORCO) 5-325 MG tablet Take 1-2 tablets by mouth every 6 (six) hours as needed for moderate pain. 40 tablet 0   latanoprost (XALATAN) 0.005 % ophthalmic solution Place 1 drop into both eyes at bedtime.      levothyroxine (SYNTHROID) 125 MCG tablet Take 125 mcg by mouth daily before breakfast.     nitroGLYCERIN (NITROSTAT) 0.4 MG SL tablet Place 1 tablet (0.4 mg total) under the tongue every 5 (five) minutes as needed for chest pain. 30 tablet 3   Polyethyl Glycol-Propyl Glycol 0.4-0.3 % SOLN Place 1 drop into both eyes daily.     potassium chloride (KLOR-CON) 10 MEQ tablet Take 1 tablet (10 mEq total) by mouth daily. 90 tablet 3   rosuvastatin (CRESTOR) 10 MG tablet TAKE 1/2 TABLET EVERY DAY 15 tablet 1   tiZANidine (ZANAFLEX) 2 MG tablet Take 1 tablet (2 mg total) by mouth every 8 (eight) hours as needed for muscle spasms. (Patient not taking: Reported on 04/20/2022) 40 tablet 0   No current facility-administered medications for this visit.    Allergies:   Atorvastatin   Social History:  The patient  reports that he has never smoked. He  has never been exposed to tobacco smoke. He has never used smokeless tobacco. He reports that he does not drink alcohol and does not use drugs.   Family History:  The patient's family history includes Heart disease in his brother, brother, sister, and sister.  ROS:  Please see the history of present illness.  All other systems are reviewed and otherwise negative.   PHYSICAL EXAM:  VS:  There were no vitals taken for this visit. BMI: There is no height or weight on file to calculate BMI. Well nourished, well developed, in no acute distress  HEENT: normocephalic, atraumatic  Neck: no JVD, carotid bruits or masses Cardiac:  irreg-irreg; no significant murmurs, no rubs, or gallops Lungs:   CTA b/l no wheezing, rhonchi or rales  Abd: soft, nontender MS: no deformity or atrophy Ext: trace ankle edema Skin: warm and  dry, no rash Neuro:  No gross deficits appreciated Psych: euthymic mood, full affect  ICD site is stable, no tethering or discomfort   EKG:  done today and reviewed by myself: AFib VPaces with some intrinsic/fused beats, 71bpm  ICD interrogation done today and reviewed by myself:  Battery and lead measurements are stable from last RV threshold unchanged from last, outputs are increased for 2:1 LV threshold also unchanged from last, 2.7/1.5 outouts is at 3V, I did not change it with known hx of stim from this lead. AFib probably since the 16th, log suggests intermittent, histograms looks persistent since then.   11/23/2019: TTE IMPRESSIONS  1. Left ventricular ejection fraction, by estimation, is 30 to 35%. The  left ventricle has moderately decreased function. The left ventricle  demonstrates regional wall motion abnormalities (see scoring  diagram/findings for description). The left  ventricular internal cavity size was mildly dilated. Left ventricular  diastolic parameters are consistent with Grade II diastolic dysfunction  (pseudonormalization). Elevated left  ventricular end-diastolic pressure.   2. Right ventricular systolic function is normal. The right ventricular  size is normal.   3. The mitral valve is normal in structure. Mild mitral valve  regurgitation. No evidence of mitral stenosis.   4. The aortic valve is tricuspid. Aortic valve regurgitation is mild. No  aortic stenosis is present.   5. Aortic dilatation noted. There is mild dilatation of the ascending  aorta.   6. The inferior vena cava is normal in size with greater than 50%  respiratory variability, suggesting right atrial pressure of 3 mmHg.    12/20/2019: Stress myoview The left ventricular ejection fraction is severely decreased (<30%). Nuclear stress EF: 23%. There was no ST segment deviation noted during stress. There is a large defect of severe severity present in the basal anteroseptal, basal inferoseptal, mid anterior, mid anteroseptal, mid inferoseptal, apical anterior, apical septal and apex location. This consistent with prior infarct/scar. No ischemia. Findings consistent with prior myocardial infarction. This is a high risk study.   Recent Labs: 03/03/2022: BUN 16; Creatinine, Ser 1.15; Hemoglobin 14.9; Platelets 245; Potassium 4.4; Sodium 140 03/31/2022: ALT 35; TSH 1.180  05/30/2021: Chol/HDL Ratio 4.6; Cholesterol, Total 147; HDL 32; LDL Chol Calc (NIH) 83; Triglycerides 189   CrCl cannot be calculated (Patient's most recent lab result is older than the maximum 21 days allowed.).   Wt Readings from Last 3 Encounters:  03/31/22 232 lb 9.6 oz (105.5 kg)  02/17/22 233 lb 12.8 oz (106.1 kg)  11/12/21 230 lb (104.3 kg)     Other studies reviewed: Additional studies/records reviewed today include: summarized above  ASSESSMENT AND PLAN:  1. CRT-D      Intact function, 92% BV pacing      Has been dependent in the past, not today      RV outputs adjusted for 2:1  2.  ICM 3. Chronic CHF      Exam looks OK currently  I think symptoms primarily are the  AFib , but they also think the increased dose of Jardiance is contributing He has been very sensitive/intolerant to meds all along BP does not allow much room Sounds like he gets orthostatic quickly with meds as well  Will stop Jardiance for now  4. CAD     Epigastruc discomfort as discussed above, suspect the AFib      C/w Dr. Acie Fredrickson   5. Persistent Afib     CHA2DS2Vasc is 4, on Eliquis      on amiodarone  Will get DCCV next week, he reports excellent medication compliance and no missed Eliquis doses  We will have him back in a couple weeks, if maintaining SR and see how he is feeling His daughter asks about stopping the statin to see if that will help, not for now.    Disposition: as above  Current medicines are reviewed at length with the patient today.  The patient did not have any concerns regarding medicines.  Venetia Night, PA-C 04/30/2022 4:30 PM     [address] [place] [address] [phone] (office)  [phone] (fax)

## 2022-04-30 NOTE — H&P (View-Only) (Signed)
Cardiology Office Note Date:  04/30/2022  Patient ID:  [name], [name] Oct 27, 1940, MRN [id number] PCP:  Kaleen Mask, MD  Cardiologist:  Dr. Elease Hashimoto EP: Dr. Graciela Husbands    Chief Complaint:  increased AFib burden  History of Present Illness: [name] is a 81 y.o. male with history of CAD, chronic CHF (systolic), ICD, BPH,  HLD, gout, AFib.  He has been seeing Dr. Graciela Husbands frequently of late,  June 13th, s/p gen change, volume on board and meds adjusted, mentions in the past, has not tolerated Entresto nor did he tolerate spironolactone in conjunction with the Entresto, and spironolactone was started alone, his lasix increased for 5 days.  Planned to try and add on SGLT2i at his next visit Note mentions intolerant of amiodarone with GI intolerance, sun sensitivity, and neuro symptoms  >> though is on low dose amio  03/31/22, did not tolerate spironolactone (unclear why), started on Jardiance 5mg  with plans to increase if able, planned for amio labs Discussed elevated LV threshold known, kept at 1/2V, no need for doubler with BSci.  04/20/22: tolerating jardiance with good BP, no orthostatic lightheadedness and increased to 20mg  daily  TODAY He is accompanied by his daughter He is feeling pretty awful, probably about the 15th-16th they recall because his power was out for several hours then. He is very fatigued, weak, not near syncopal. Gets lightheaded on standing, this is worse, but not new. He has had some CP the last couple days, locates to the epigastrium, sharp, non-radiating, not positional, has tried ASA and TUMS without relief, s/l NTG did help. None today   Recalls last year his rhythm corrected but 5 minutes later back out and they had to shock him again     Device information MDT CRT-D, gen change 11/07/2014, gen change 11/12/21 now with a BSci generator RA lead 2010 RV lead 2004 LV lead 2011  Known to have LV lead diaphragmatic stim that has been very difficult  to program around Known high LV threshold   Past Medical History:  Diagnosis Date   AICD (automatic cardioverter/defibrillator) present    Arthritis    knees, back    BPH (benign prostatic hypertrophy)    CHF (congestive heart failure) (HCC)    Chronic kidney disease    BPH   Chronic systolic heart failure (HCC)    a. s/p MDT single chamber ICD 2004 as part of MASTER study b. upgrade to CRTD 2010; CRTD gen change 2016   Coronary artery disease    a. s/p anterior MI and CYPHER stent to LAD 2005   Dyslipidemia    Dysrhythmia    a-fib   Gout    Ischemic cardiomyopathy    Paroxysmal atrial fibrillation (HCC)    Ventricular tachycardia (HCC)     Past Surgical History:  Procedure Laterality Date   APPENDECTOMY  1970   BI-VENTRICULAR IMPLANTABLE CARDIOVERTER DEFIBRILLATOR UPGRADE N/A 11/07/2014   a. MDT single chamber ICD implanted 2004 as part of MASTER study; upgrade to CRTD 2010; gen change 2016   BIV ICD GENERATOR CHANGEOUT N/A 11/12/2021   Procedure: BIV ICD GENERATOR CHANGEOUT;  Surgeon: 2017, MD;  Location: [facility] INVASIVE CV LAB;  Service: Cardiovascular;  Laterality: N/A;   CARDIAC CATHETERIZATION  09/08/2003   x3 total of 5 stents   CARDIOVERSION N/A 07/04/2019   Procedure: CARDIOVERSION;  Surgeon: 11/06/2003, MD;  Location: [facility] ENDOSCOPY;  Service: Cardiovascular;  Laterality: N/A;   CARDIOVERSION N/A 08/17/2019  Procedure: CARDIOVERSION;  Surgeon: Vesta Mixer, MD;  Location: [facility] ENDOSCOPY;  Service: Cardiovascular;  Laterality: N/A;   PROSTATECTOMY  11/06/2011   Procedure: PROSTATECTOMY SUPRAPUBIC;  Surgeon: Kathi Ludwig, MD;  Location: [facility] ORS;  Service: Urology;  Laterality: N/A;  Open Suprapubic Prostatectomy   TEE WITHOUT CARDIOVERSION N/A 05/16/2019   Procedure: TRANSESOPHAGEAL ECHOCARDIOGRAM (TEE);  Surgeon: Sande Rives, MD;  Location: [facility] ENDOSCOPY;  Service: Cardiology;  Laterality: N/A;   TOTAL KNEE ARTHROPLASTY Left 10/06/2021    Procedure: TOTAL KNEE ARTHROPLASTY;  Surgeon: Jodi Geralds, MD;  Location: [facility] ORS;  Service: Orthopedics;  Laterality: Left;    Current Outpatient Medications  Medication Sig Dispense Refill   allopurinol (ZYLOPRIM) 100 MG tablet Take 100 mg by mouth daily.     amiodarone (PACERONE) 200 MG tablet TAKE 1/2 TABLET EVERY DAY 45 tablet 1   calcium carbonate (TUMS - DOSED IN MG ELEMENTAL CALCIUM) 500 MG chewable tablet Chew 1 tablet by mouth daily as needed for indigestion or heartburn.     carvedilol (COREG) 3.125 MG tablet TAKE 1 TABLET TWICE DAILY 180 tablet 2   ELIQUIS 5 MG TABS tablet TAKE 1 TABLET TWICE DAILY 180 tablet 1   empagliflozin (JARDIANCE) 10 MG TABS tablet Take 1 tablet (10 mg total) by mouth daily before breakfast. 90 tablet 2   furosemide (LASIX) 40 MG tablet Take 1 tablet (40 mg total) by mouth daily. 90 tablet 3   HYDROcodone-acetaminophen (NORCO) 5-325 MG tablet Take 1-2 tablets by mouth every 6 (six) hours as needed for moderate pain. 40 tablet 0   latanoprost (XALATAN) 0.005 % ophthalmic solution Place 1 drop into both eyes at bedtime.      levothyroxine (SYNTHROID) 125 MCG tablet Take 125 mcg by mouth daily before breakfast.     nitroGLYCERIN (NITROSTAT) 0.4 MG SL tablet Place 1 tablet (0.4 mg total) under the tongue every 5 (five) minutes as needed for chest pain. 30 tablet 3   Polyethyl Glycol-Propyl Glycol 0.4-0.3 % SOLN Place 1 drop into both eyes daily.     potassium chloride (KLOR-CON) 10 MEQ tablet Take 1 tablet (10 mEq total) by mouth daily. 90 tablet 3   rosuvastatin (CRESTOR) 10 MG tablet TAKE 1/2 TABLET EVERY DAY 15 tablet 1   tiZANidine (ZANAFLEX) 2 MG tablet Take 1 tablet (2 mg total) by mouth every 8 (eight) hours as needed for muscle spasms. (Patient not taking: Reported on 04/20/2022) 40 tablet 0   No current facility-administered medications for this visit.    Allergies:   Atorvastatin   Social History:  The patient  reports that he has never smoked. He  has never been exposed to tobacco smoke. He has never used smokeless tobacco. He reports that he does not drink alcohol and does not use drugs.   Family History:  The patient's family history includes Heart disease in his brother, brother, sister, and sister.  ROS:  Please see the history of present illness.  All other systems are reviewed and otherwise negative.   PHYSICAL EXAM:  VS:  There were no vitals taken for this visit. BMI: There is no height or weight on file to calculate BMI. Well nourished, well developed, in no acute distress  HEENT: normocephalic, atraumatic  Neck: no JVD, carotid bruits or masses Cardiac:  irreg-irreg; no significant murmurs, no rubs, or gallops Lungs:   CTA b/l no wheezing, rhonchi or rales  Abd: soft, nontender MS: no deformity or atrophy Ext: trace ankle edema Skin: warm and  dry, no rash Neuro:  No gross deficits appreciated Psych: euthymic mood, full affect  ICD site is stable, no tethering or discomfort   EKG:  done today and reviewed by myself: AFib VPaces with some intrinsic/fused beats, 71bpm  ICD interrogation done today and reviewed by myself:  Battery and lead measurements are stable from last RV threshold unchanged from last, outputs are increased for 2:1 LV threshold also unchanged from last, 2.7/1.5 outouts is at 3V, I did not change it with known hx of stim from this lead. AFib probably since the 16th, log suggests intermittent, histograms looks persistent since then.   11/23/2019: TTE IMPRESSIONS  1. Left ventricular ejection fraction, by estimation, is 30 to 35%. The  left ventricle has moderately decreased function. The left ventricle  demonstrates regional wall motion abnormalities (see scoring  diagram/findings for description). The left  ventricular internal cavity size was mildly dilated. Left ventricular  diastolic parameters are consistent with Grade II diastolic dysfunction  (pseudonormalization). Elevated left  ventricular end-diastolic pressure.   2. Right ventricular systolic function is normal. The right ventricular  size is normal.   3. The mitral valve is normal in structure. Mild mitral valve  regurgitation. No evidence of mitral stenosis.   4. The aortic valve is tricuspid. Aortic valve regurgitation is mild. No  aortic stenosis is present.   5. Aortic dilatation noted. There is mild dilatation of the ascending  aorta.   6. The inferior vena cava is normal in size with greater than 50%  respiratory variability, suggesting right atrial pressure of 3 mmHg.    12/20/2019: Stress myoview The left ventricular ejection fraction is severely decreased (<30%). Nuclear stress EF: 23%. There was no ST segment deviation noted during stress. There is a large defect of severe severity present in the basal anteroseptal, basal inferoseptal, mid anterior, mid anteroseptal, mid inferoseptal, apical anterior, apical septal and apex location. This consistent with prior infarct/scar. No ischemia. Findings consistent with prior myocardial infarction. This is a high risk study.   Recent Labs: 03/03/2022: BUN 16; Creatinine, Ser 1.15; Hemoglobin 14.9; Platelets 245; Potassium 4.4; Sodium 140 03/31/2022: ALT 35; TSH 1.180  05/30/2021: Chol/HDL Ratio 4.6; Cholesterol, Total 147; HDL 32; LDL Chol Calc (NIH) 83; Triglycerides 189   CrCl cannot be calculated (Patient's most recent lab result is older than the maximum 21 days allowed.).   Wt Readings from Last 3 Encounters:  03/31/22 232 lb 9.6 oz (105.5 kg)  02/17/22 233 lb 12.8 oz (106.1 kg)  11/12/21 230 lb (104.3 kg)     Other studies reviewed: Additional studies/records reviewed today include: summarized above  ASSESSMENT AND PLAN:  1. CRT-D      Intact function, 92% BV pacing      Has been dependent in the past, not today      RV outputs adjusted for 2:1  2.  ICM 3. Chronic CHF      Exam looks OK currently  I think symptoms primarily are the  AFib , but they also think the increased dose of Jardiance is contributing He has been very sensitive/intolerant to meds all along BP does not allow much room Sounds like he gets orthostatic quickly with meds as well  Will stop Jardiance for now  4. CAD     Epigastruc discomfort as discussed above, suspect the AFib      C/w Dr. Acie Fredrickson   5. Persistent Afib     CHA2DS2Vasc is 4, on Eliquis      on amiodarone  Will get DCCV next week, he reports excellent medication compliance and no missed Eliquis doses  We will have him back in a couple weeks, if maintaining SR and see how he is feeling His daughter asks about stopping the statin to see if that will help, not for now.    Disposition: as above  Current medicines are reviewed at length with the patient today.  The patient did not have any concerns regarding medicines.  Venetia Night, PA-C 04/30/2022 4:30 PM     [place] [address] [place] [address] [phone] (office)  [phone] (fax)

## 2022-05-01 ENCOUNTER — Ambulatory Visit (INDEPENDENT_AMBULATORY_CARE_PROVIDER_SITE_OTHER): Payer: Medicare PPO | Admitting: Physician Assistant

## 2022-05-01 ENCOUNTER — Encounter: Payer: Self-pay | Admitting: Physician Assistant

## 2022-05-01 ENCOUNTER — Encounter: Payer: Self-pay | Admitting: *Deleted

## 2022-05-01 VITALS — BP 100/56 | HR 69 | Ht 70.0 in | Wt 230.0 lb

## 2022-05-01 DIAGNOSIS — I4819 Other persistent atrial fibrillation: Secondary | ICD-10-CM

## 2022-05-01 DIAGNOSIS — Z95810 Presence of automatic (implantable) cardiac defibrillator: Secondary | ICD-10-CM

## 2022-05-01 DIAGNOSIS — I5022 Chronic systolic (congestive) heart failure: Secondary | ICD-10-CM

## 2022-05-01 DIAGNOSIS — I2510 Atherosclerotic heart disease of native coronary artery without angina pectoris: Secondary | ICD-10-CM | POA: Diagnosis not present

## 2022-05-01 DIAGNOSIS — I255 Ischemic cardiomyopathy: Secondary | ICD-10-CM | POA: Diagnosis not present

## 2022-05-01 LAB — CUP PACEART INCLINIC DEVICE CHECK
Date Time Interrogation Session: 20230825174543
HighPow Impedance: 51 Ohm
Implantable Lead Implant Date: 20040315
Implantable Lead Implant Date: 20100611
Implantable Lead Implant Date: 20100611
Implantable Lead Location: 753858
Implantable Lead Location: 753859
Implantable Lead Location: 753860
Implantable Lead Model: 4196
Implantable Lead Model: 5076
Implantable Lead Model: 6947
Implantable Pulse Generator Implant Date: 20230308
Lead Channel Impedance Value: 457 Ohm
Lead Channel Impedance Value: 492 Ohm
Lead Channel Impedance Value: 570 Ohm
Lead Channel Pacing Threshold Amplitude: 0.6 V
Lead Channel Pacing Threshold Amplitude: 1.2 V
Lead Channel Pacing Threshold Amplitude: 2.7 V
Lead Channel Pacing Threshold Pulse Width: 0.4 ms
Lead Channel Pacing Threshold Pulse Width: 0.4 ms
Lead Channel Pacing Threshold Pulse Width: 1.5 ms
Lead Channel Sensing Intrinsic Amplitude: 17.6 mV
Lead Channel Sensing Intrinsic Amplitude: 25 mV
Lead Channel Sensing Intrinsic Amplitude: 3.9 mV
Lead Channel Setting Pacing Amplitude: 2 V
Lead Channel Setting Pacing Amplitude: 2.5 V
Lead Channel Setting Pacing Amplitude: 3 V
Lead Channel Setting Pacing Pulse Width: 0.4 ms
Lead Channel Setting Pacing Pulse Width: 1.5 ms
Lead Channel Setting Sensing Sensitivity: 0.5 mV
Lead Channel Setting Sensing Sensitivity: 1 mV
Pulse Gen Serial Number: 152344

## 2022-05-01 NOTE — Patient Instructions (Addendum)
Medication Instructions:   Your physician recommends that you continue on your current medications as directed. Please refer to the Current Medication list given to you today.  *If you need a refill on your cardiac medications before your next appointment, please call your pharmacy*   Lab Work:  BMET AND CBC TODAY   If you have labs (blood work) drawn today and your tests are completely normal, you will receive your results only by: MyChart Message (if you have MyChart) OR A paper copy in the mail If you have any lab test that is abnormal or we need to change your treatment, we will call you to review the results.   Testing/Procedures: Your physician has recommended that you have a Cardioversion (DCCV). Electrical Cardioversion uses a jolt of electricity to your heart either through paddles or wired patches attached to your chest. This is a controlled, usually prescheduled, procedure. Defibrillation is done under light anesthesia in the hospital, and you usually go home the day of the procedure. This is done to get your heart back into a normal rhythm. You are not awake for the procedure. Please see the instruction sheet given to you today.    Follow-Up: At [facility], you and your health needs are our priority.  As part of our continuing mission to provide you with exceptional heart care, we have created designated Provider Care Teams.  These Care Teams include your primary Cardiologist (physician) and Advanced Practice Providers (APPs -  Physician Assistants and Nurse Practitioners) who all work together to provide you with the care you need, when you need it.  We recommend signing up for the patient portal called "MyChart".  Sign up information is provided on this After Visit Summary.  MyChart is used to connect with patients for Virtual Visits (Telemedicine).  Patients are able to view lab/test results, encounter notes, upcoming appointments, etc.  Non-urgent messages can be sent to  your provider as well.   To learn more about what you can do with MyChart, go to [email or web address].    Your next appointment:   2 week(s) AFTER 8-28 POST CARDIOVERSION   The format for your next appointment:   In Person  Provider:   Francis Dowse, PA-C    Other Instructions   Important Information About Sugar

## 2022-05-02 LAB — BASIC METABOLIC PANEL
BUN/Creatinine Ratio: 14 (ref 10–24)
BUN: 19 mg/dL (ref 8–27)
CO2: 22 mmol/L (ref 20–29)
Calcium: 8.9 mg/dL (ref 8.6–10.2)
Chloride: 99 mmol/L (ref 96–106)
Creatinine, Ser: 1.36 mg/dL — ABNORMAL HIGH (ref 0.76–1.27)
Glucose: 185 mg/dL — ABNORMAL HIGH (ref 70–99)
Potassium: 4.2 mmol/L (ref 3.5–5.2)
Sodium: 138 mmol/L (ref 134–144)
eGFR: 52 mL/min/{1.73_m2} — ABNORMAL LOW (ref >59)

## 2022-05-02 LAB — CBC
Hematocrit: 47.1 % (ref 37.5–51.0)
Hemoglobin: 15.8 g/dL (ref 13.0–17.7)
MCH: 31.9 pg (ref 26.6–33.0)
MCHC: 33.5 g/dL (ref 31.5–35.7)
MCV: 95 fL (ref 79–97)
Platelets: 231 10*3/uL (ref 150–450)
RBC: 4.96 x10E6/uL (ref 4.14–5.80)
RDW: 12.6 % (ref 11.6–15.4)
WBC: 7.7 10*3/uL (ref 3.4–10.8)

## 2022-05-03 NOTE — Anesthesia Preprocedure Evaluation (Signed)
Anesthesia Evaluation  Patient identified by MRN, date of birth, ID band Patient awake    Reviewed: Allergy & Precautions, NPO status , Patient's Chart, lab work & pertinent test results  Airway Mallampati: III  TM Distance: >3 FB Neck ROM: Limited    Dental  (+) Teeth Intact, Dental Advisory Given   Pulmonary neg pulmonary ROS,    Pulmonary exam normal breath sounds clear to auscultation       Cardiovascular hypertension, Pt. on home beta blockers and Pt. on medications + CAD, + Past MI, + Cardiac Stents and +CHF  + dysrhythmias Atrial Fibrillation + Cardiac Defibrillator + Valvular Problems/Murmurs MR and AI  Rhythm:Irregular Rate:Normal  Pacer interrogation 03/2022 % A Paced  31 % RV Paced 88 % LV Paced 92  Hx of ischemioc cardiomyopathy   Echo 11/23/2019 1. Left ventricular ejection fraction, by estimation, is 30 to 35%. The left ventricle has moderately decreased function. The left ventricle demonstrates regional wall motion abnormalities (see scoring diagram/findings for description). The left ventricular internal cavity size was mildly dilated. Left ventricular diastolic parameters are consistent with Grade II diastolic dysfunction  (pseudonormalization). Elevated left ventricular end-diastolic pressure.  2. Right ventricular systolic function is normal. The right ventricular size is normal.  3. The mitral valve is normal in structure. Mild mitral valve  regurgitation. No evidence of mitral stenosis.  4. The aortic valve is tricuspid. Aortic valve regurgitation is mild. No aortic stenosis is present.  5. Aortic dilatation noted. There is mild dilatation of the ascending aorta.  6. The inferior vena cava is normal in size with greater than 50% respiratory variability, suggesting right atrial pressure of 3 mmHg.   Neuro/Psych negative neurological ROS  negative psych ROS   GI/Hepatic   Endo/Other     Renal/GU Renal diseaseLab Results      Component                Value               Date                      CREATININE               1.20                10/01/2021                BUN                      20                  10/01/2021                NA                       136                 10/01/2021                K                        4.2                 10/01/2021                CL  104                 10/01/2021                CO2                      25                  10/01/2021                Musculoskeletal  (+) Arthritis ,   Abdominal (+) + obese (BMI 32.08),   Peds  Hematology  (+) Blood dyscrasia, anemia , Lab Results      Component                Value               Date                      WBC                      6.6                 10/01/2021                HGB                      15.3                10/01/2021                HCT                      46.4                10/01/2021                MCV                      97.9                10/01/2021                PLT                      249                 10/01/2021              Anesthesia Other Findings   Reproductive/Obstetrics                           Anesthesia Physical  Anesthesia Plan  ASA: 4  Anesthesia Plan: General   Post-op Pain Management:    Induction: Intravenous  PONV Risk Score and Plan: 2 and Propofol infusion and TIVA  Airway Management Planned: Mask  Additional Equipment: None  Intra-op Plan:   Post-operative Plan:   Informed Consent: I have reviewed the patients History and Physical, chart, labs and discussed the procedure including the risks, benefits and alternatives for the proposed anesthesia with the patient or authorized representative who has indicated his/her understanding and acceptance.     Dental advisory given  Plan Discussed with: CRNA  Anesthesia Plan Comments: (See PAT note 10/01/2021, Jodell Cipro Ward, PA-C  Took Eliquis 1/26)  Anesthesia Quick Evaluation  

## 2022-05-04 ENCOUNTER — Ambulatory Visit (HOSPITAL_COMMUNITY)
Admission: RE | Admit: 2022-05-04 | Discharge: 2022-05-04 | Disposition: A | Discharge status: Home / Self Care | Payer: Medicare PPO | Attending: Cardiology | Admitting: Cardiology

## 2022-05-04 ENCOUNTER — Ambulatory Visit (HOSPITAL_COMMUNITY): Payer: Medicare PPO | Admitting: Anesthesiology

## 2022-05-04 ENCOUNTER — Encounter (HOSPITAL_COMMUNITY)
Admission: RE | Disposition: A | Discharge status: Home / Self Care | Payer: Self-pay | Source: Home / Self Care | Attending: Cardiology

## 2022-05-04 ENCOUNTER — Encounter (HOSPITAL_COMMUNITY): Payer: Self-pay | Admitting: Cardiology

## 2022-05-04 ENCOUNTER — Ambulatory Visit (HOSPITAL_BASED_OUTPATIENT_CLINIC_OR_DEPARTMENT_OTHER): Payer: Medicare PPO | Admitting: Anesthesiology

## 2022-05-04 ENCOUNTER — Other Ambulatory Visit: Payer: Self-pay

## 2022-05-04 DIAGNOSIS — E785 Hyperlipidemia, unspecified: Secondary | ICD-10-CM | POA: Insufficient documentation

## 2022-05-04 DIAGNOSIS — I5022 Chronic systolic (congestive) heart failure: Secondary | ICD-10-CM | POA: Diagnosis not present

## 2022-05-04 DIAGNOSIS — I2510 Atherosclerotic heart disease of native coronary artery without angina pectoris: Secondary | ICD-10-CM

## 2022-05-04 DIAGNOSIS — I4891 Unspecified atrial fibrillation: Secondary | ICD-10-CM

## 2022-05-04 DIAGNOSIS — M109 Gout, unspecified: Secondary | ICD-10-CM | POA: Diagnosis not present

## 2022-05-04 DIAGNOSIS — N400 Enlarged prostate without lower urinary tract symptoms: Secondary | ICD-10-CM | POA: Diagnosis not present

## 2022-05-04 DIAGNOSIS — I4819 Other persistent atrial fibrillation: Secondary | ICD-10-CM | POA: Diagnosis present

## 2022-05-04 DIAGNOSIS — I255 Ischemic cardiomyopathy: Secondary | ICD-10-CM | POA: Diagnosis not present

## 2022-05-04 DIAGNOSIS — I509 Heart failure, unspecified: Secondary | ICD-10-CM

## 2022-05-04 DIAGNOSIS — Z95810 Presence of automatic (implantable) cardiac defibrillator: Secondary | ICD-10-CM | POA: Insufficient documentation

## 2022-05-04 DIAGNOSIS — I110 Hypertensive heart disease with heart failure: Secondary | ICD-10-CM | POA: Diagnosis not present

## 2022-05-04 DIAGNOSIS — I252 Old myocardial infarction: Secondary | ICD-10-CM

## 2022-05-04 DIAGNOSIS — Z7901 Long term (current) use of anticoagulants: Secondary | ICD-10-CM | POA: Diagnosis not present

## 2022-05-04 DIAGNOSIS — Z79899 Other long term (current) drug therapy: Secondary | ICD-10-CM | POA: Insufficient documentation

## 2022-05-04 HISTORY — PX: CARDIOVERSION: SHX1299

## 2022-05-04 LAB — POCT I-STAT, CHEM 8
BUN: 17 mg/dL (ref 8–23)
Calcium, Ion: 1.18 mmol/L (ref 1.15–1.40)
Chloride: 101 mmol/L (ref 98–111)
Creatinine, Ser: 1.2 mg/dL (ref 0.61–1.24)
Glucose, Bld: 131 mg/dL — ABNORMAL HIGH (ref 70–99)
HCT: 47 % (ref 39.0–52.0)
Hemoglobin: 16 g/dL (ref 13.0–17.0)
Potassium: 4.4 mmol/L (ref 3.5–5.1)
Sodium: 140 mmol/L (ref 135–145)
TCO2: 29 mmol/L (ref 22–32)

## 2022-05-04 SURGERY — CARDIOVERSION
Anesthesia: General

## 2022-05-04 MED ORDER — LIDOCAINE 2% (20 MG/ML) 5 ML SYRINGE
INTRAMUSCULAR | Status: DC | PRN
  Administered 2022-05-04: 100 mg via INTRAVENOUS

## 2022-05-04 MED ORDER — SODIUM CHLORIDE 0.9 % IV SOLN: INTRAVENOUS | Status: DC

## 2022-05-04 MED ORDER — PHENYLEPHRINE HCL (PRESSORS) 10 MG/ML IV SOLN
INTRAVENOUS | Status: DC | PRN
  Administered 2022-05-04: 80 ug via INTRAVENOUS

## 2022-05-04 MED ORDER — PROPOFOL 10 MG/ML IV BOLUS
INTRAVENOUS | Status: DC | PRN
  Administered 2022-05-04: 60 mg via INTRAVENOUS

## 2022-05-04 NOTE — Interval H&P Note (Signed)
History and Physical Interval Note:  05/04/2022 7:37 AM  [name]  has presented today for surgery, with the diagnosis of AFIB.  The various methods of treatment have been discussed with the patient and family. After consideration of risks, benefits and other options for treatment, the patient has consented to  Procedure(s): CARDIOVERSION (N/A) as a surgical intervention.  The patient's history has been reviewed, patient examined, no change in status, stable for surgery.  I have reviewed the patient's chart and labs.  Questions were answered to the patient's satisfaction.     Armanda Magic

## 2022-05-04 NOTE — CV Procedure (Signed)
    Electrical Cardioversion Procedure Note MASE DHONDT [id number] [birth year]  Procedure: Electrical Cardioversion Indications:  Atrial Fibrillation  Time Out: Verified patient identification, verified procedure,medications/allergies/relevent history reviewed, required imaging and test results available.  Performed  Procedure Details  The patient was NPO after midnight. Anesthesia was administered at the beside  by Dr.Germeroth with 60mg  of propofol and 100mg  Lidocaine.  Cardioversion was done with synchronized biphasic defibrillation with AP pads with 150watts.  The patient converted to normal sinus rhythm. The patient tolerated the procedure well   IMPRESSION:  Successful cardioversion of atrial fibrillation    Kalisha Keadle 05/04/2022, 7:37 AM

## 2022-05-04 NOTE — Discharge Instructions (Signed)
Electrical Cardioversion  Electrical cardioversion is the delivery of a jolt of electricity to restore a normal rhythm to the heart. A rhythm that is too fast or is not regular keeps the heart from pumping well. In this procedure, sticky patches or metal paddles are placed on the chest to deliver electricity to the heart from a device.  What can I expect after the procedure?  Your blood pressure, heart rate, breathing rate, and blood oxygen level will be monitored until you leave the hospital or clinic.  Your heart rhythm will be watched to make sure it does not change.  You may have some redness on the skin where the shocks were given.If this occurs, can use hydrocortisone cream or Aloe vera.  Follow these instructions at home:  Do not drive for 24 hours if you were given a sedative during your procedure.  Take over-the-counter and prescription medicines only as told by your health care provider.  Ask your health care provider how to check your pulse. Check it often.  Rest for 48 hours after the procedure or as told by your health care provider.  Avoid or limit your caffeine use as told by your health care provider.  Keep all follow-up visits as told by your health care provider. This is important.  Contact a health care provider if:  You feel like your heart is beating too quickly or your pulse is not regular.  You have a serious muscle cramp that does not go away.  Get help right away if:  You have discomfort in your chest.  You are dizzy or you feel faint.  You have trouble breathing or you are short of breath.  Your speech is slurred.  You have trouble moving an arm or leg on one side of your body.  Your fingers or toes turn cold or blue.  Summary  Electrical cardioversion is the delivery of a jolt of electricity to restore a normal rhythm to the heart.  This procedure may be done right away in an emergency or may be a scheduled procedure if the condition is not  an emergency.  Generally, this is a safe procedure.  After the procedure, check your pulse often as told by your health care provider.  This information is not intended to replace advice given to you by your health care provider. Make sure you discuss any questions you have with your health care provider. Document Revised: 03/27/2019 Document Reviewed: 03/27/2019 Elsevier Patient Education  2021 Elsevier Inc.  

## 2022-05-04 NOTE — Anesthesia Procedure Notes (Signed)
Procedure Name: General with mask airway Date/Time: 05/04/2022 7:56 AM  Performed by: Audie Pinto, CRNAPre-anesthesia Checklist: Patient identified, Emergency Drugs available, Suction available and Patient being monitored Patient Re-evaluated:Patient Re-evaluated prior to induction Oxygen Delivery Method: Ambu bag Induction Type: IV induction Placement Confirmation: positive ETCO2 Dental Injury: Teeth and Oropharynx as per pre-operative assessment

## 2022-05-04 NOTE — Anesthesia Postprocedure Evaluation (Signed)
Anesthesia Post Note  Patient: [name]  Procedure(s) Performed: CARDIOVERSION     Patient location during evaluation: Endoscopy Anesthesia Type: General Level of consciousness: patient cooperative and awake and alert Pain management: pain level controlled Vital Signs Assessment: post-procedure vital signs reviewed and stable Respiratory status: spontaneous breathing Cardiovascular status: stable Anesthetic complications: no   No notable events documented.  Last Vitals:  Vitals:   05/04/22 0820 05/04/22 0830  BP: 119/74 128/77  Pulse: 61 65  Resp: 19 10  Temp:    SpO2: 94% 96%    Last Pain:  Vitals:   05/04/22 0830  TempSrc:   PainSc: 0-No pain                 Lewie Loron

## 2022-05-04 NOTE — Transfer of Care (Signed)
Immediate Anesthesia Transfer of Care Note  Patient: [name]  Procedure(s) Performed: CARDIOVERSION  Patient Location: Endoscopy Unit  Anesthesia Type:General  Level of Consciousness: drowsy and patient cooperative  Airway & Oxygen Therapy: Patient Spontanous Breathing and Patient connected to nasal cannula oxygen  Post-op Assessment: Report given to RN and Post -op Vital signs reviewed and stable  Post vital signs: Reviewed and stable  Last Vitals:  Vitals Value Taken Time  BP 118/71   Temp    Pulse 66   Resp 15   SpO2 98%     Last Pain:  Vitals:   05/04/22 0707  TempSrc: Temporal  PainSc: 0-No pain         Complications: No notable events documented.

## 2022-05-06 ENCOUNTER — Encounter (HOSPITAL_COMMUNITY): Payer: Self-pay | Admitting: Cardiology

## 2022-05-06 ENCOUNTER — Encounter: Payer: Medicare PPO | Admitting: Student

## 2022-05-12 ENCOUNTER — Ambulatory Visit (INDEPENDENT_AMBULATORY_CARE_PROVIDER_SITE_OTHER): Payer: Medicare PPO

## 2022-05-12 DIAGNOSIS — Z95810 Presence of automatic (implantable) cardiac defibrillator: Secondary | ICD-10-CM | POA: Diagnosis not present

## 2022-05-12 DIAGNOSIS — I5022 Chronic systolic (congestive) heart failure: Secondary | ICD-10-CM

## 2022-05-12 NOTE — Progress Notes (Signed)
EPIC Encounter for ICM Monitoring  Patient Name: [name] is a 81 y.o. male Date: 05/12/2022 Primary Care Physican: Kaleen Mask, MD Primary Cardiologist: Nahser Electrophysiologist: Graciela Husbands 02/17/2022 Office Weight:  233 lbs 05/12/2022 Weight: 230 lbs Right Ventricular:   92% Left Ventricular:     94%                                                                                     Spoke with patient and heart failure questions reviewed.  Pt reports swelling of right foot and lack of energy.  He had stomach virus with vomiting for past several days and increased fluid intake.  Offered to call daughter about retaining fluid and he declined.     HeartLogic Heart Failure Index is 32 suggesting and thoracic impedance trending down suggesting possible fluid accumulation starting 8/25.   Suggesting Afib 8/16-8/28.  Cardioversion completed 05/04/22.    Prescribed: Furosemide 40 mg take 1 tablet by mouth daily Potassium 10 mEq ke 1 tablet by mouth daily Spironolactone 25 mg take 0.5 tablet (12.5 mg total) daily at bedtime   Labs: 05/01/2022 Creatinine 1.36, BUN 19, Potassium 4.2, Sodium 138, GFR 52 03/03/2022 Creatinine 1.15, BUN 16, Potassium 4.4, Sodium 140, GFR 64 10/22/2021 Creatinine 1.15, BUN 15, Potassium 4.6, Sodium 139 10/01/2021 Creatinine 1.20, BUN 20, Potassium 4.2, Sodium 136, GFR >60  A complete set of results can be found in Results Review.   Recommendations:  Advised to limit salt intake and to call if he develops any further symptoms of fluid.  Confirmed he is taking Lasix daily as prescribed.      Follow-up plan: ICM clinic phone appointment on 05/18/2022 to recheck fluid levels.   91 day device clinic remote transmission 05/14/2022.     EP/Cardiology Office Visits:  Recall 03/26/2023 with Dr Graciela Husbands.  05/25/2022 with Dr Elease Hashimoto.   Copy of ICM check sent to Dr. Graciela Husbands and Dr Melburn Popper for review and recommendations if needed.    3 Month Trend    8 Day Data Trend           Karie Soda, RN 05/12/2022 4:37 PM

## 2022-05-12 NOTE — Telephone Encounter (Signed)
See Renee Ursuy's OV note dated 05/01/2022.

## 2022-05-13 NOTE — Progress Notes (Addendum)
Returned call to daughter, [name] (listed on DPR) as requested by voice mail message.  She reports she is concerned that patient is not feeling well.  He was feeling better immediately after the cardioversion but has progressively been been feeling worse.  He has no energy.  Dicussed remote transmission results and explained the episode of Afib can trigger fluid accumulation which is what is showing on the report.   Discussed his diet and patient does not live with her and unsure what he eats at home.  He does occasionally have meals with her.  Encouraged to review patient's typically foods he eats and limit salt intake to 2000 mg daily.  Advised to avoid salt shaker.  Advised to use local ER if condition worsens before 9/18 OV with Dr Elease Hashimoto.   Will recheck fluid levels on 9/11.

## 2022-05-14 ENCOUNTER — Ambulatory Visit (INDEPENDENT_AMBULATORY_CARE_PROVIDER_SITE_OTHER): Payer: Medicare PPO

## 2022-05-14 DIAGNOSIS — I5022 Chronic systolic (congestive) heart failure: Secondary | ICD-10-CM | POA: Diagnosis not present

## 2022-05-14 LAB — CUP PACEART REMOTE DEVICE CHECK
Battery Remaining Longevity: 78 mo
Battery Remaining Percentage: 86 %
Brady Statistic RA Percent Paced: 37 %
Brady Statistic RV Percent Paced: 92 %
Date Time Interrogation Session: 20230907064600
HighPow Impedance: 48 Ohm
Implantable Lead Implant Date: 20040315
Implantable Lead Implant Date: 20100611
Implantable Lead Implant Date: 20100611
Implantable Lead Location: 753858
Implantable Lead Location: 753859
Implantable Lead Location: 753860
Implantable Lead Model: 4196
Implantable Lead Model: 5076
Implantable Lead Model: 6947
Implantable Pulse Generator Implant Date: 20230308
Lead Channel Impedance Value: 445 Ohm
Lead Channel Impedance Value: 490 Ohm
Lead Channel Impedance Value: 550 Ohm
Lead Channel Pacing Threshold Amplitude: 0.6 V
Lead Channel Pacing Threshold Amplitude: 0.9 V
Lead Channel Pacing Threshold Amplitude: 2.9 V
Lead Channel Pacing Threshold Pulse Width: 0.4 ms
Lead Channel Pacing Threshold Pulse Width: 0.4 ms
Lead Channel Pacing Threshold Pulse Width: 1.5 ms
Lead Channel Setting Pacing Amplitude: 2 V
Lead Channel Setting Pacing Amplitude: 2.5 V
Lead Channel Setting Pacing Amplitude: 3 V
Lead Channel Setting Pacing Pulse Width: 0.4 ms
Lead Channel Setting Pacing Pulse Width: 1.5 ms
Lead Channel Setting Sensing Sensitivity: 0.5 mV
Lead Channel Setting Sensing Sensitivity: 1 mV
Pulse Gen Serial Number: 152344

## 2022-05-18 ENCOUNTER — Ambulatory Visit (INDEPENDENT_AMBULATORY_CARE_PROVIDER_SITE_OTHER): Payer: Medicare PPO

## 2022-05-18 DIAGNOSIS — I5022 Chronic systolic (congestive) heart failure: Secondary | ICD-10-CM

## 2022-05-18 DIAGNOSIS — Z95810 Presence of automatic (implantable) cardiac defibrillator: Secondary | ICD-10-CM

## 2022-05-18 NOTE — Progress Notes (Signed)
EPIC Encounter for ICM Monitoring  Patient Name: [name] is a 81 y.o. male Date: 05/18/2022 Primary Care Physican: Kaleen Mask, MD Primary Cardiologist: Nahser Electrophysiologist: Graciela Husbands 02/17/2022 Office Weight:  233 lbs 05/12/2022 Weight: 230 lbs 05/18/2022 Weight: 225-230 lbs  Right Ventricular:   92% Left Ventricular:     94%                                                                                     Spoke with patient and heart failure questions reviewed.  Pt reports slight swelling of right foot and still does not have a lot of energy.  He is feeling better and the GI issues from last week has resolved.     HeartLogic Heart Failure Index is 30 and thoracic impedance is now trending up compared to last weeks report.  Index decreased from 32 to 30 and still suggesting ongoing possible fluid accumulation starting 8/25 (correlates with AFib episode).   Report does not suggest any further Afib episodes since cardioversion 05/04/22.    Prescribed: Furosemide 40 mg take 1 tablet by mouth daily Potassium 10 mEq ke 1 tablet by mouth daily Spironolactone 25 mg take 0.5 tablet (12.5 mg total) daily at bedtime   Labs: 05/01/2022 Creatinine 1.36, BUN 19, Potassium 4.2, Sodium 138, GFR 52 03/03/2022 Creatinine 1.15, BUN 16, Potassium 4.4, Sodium 140, GFR 64 10/22/2021 Creatinine 1.15, BUN 15, Potassium 4.6, Sodium 139 10/01/2021 Creatinine 1.20, BUN 20, Potassium 4.2, Sodium 136, GFR >60  A complete set of results can be found in Results Review.   Recommendations:  Pt is taking Lasix as prescribed.  Copy sent to Dr Elease Hashimoto for review for recommendations if needed and for upcoming 9/18 OV.     Follow-up plan: ICM clinic phone appointment on 05/25/2022 to recheck fluid levels.   91 day device clinic remote transmission 08/13/2022.     EP/Cardiology Office Visits:  Recall 03/26/2023 with Dr Graciela Husbands.  05/25/2022 with Dr Elease Hashimoto.   Copy of ICM check sent to Dr. Graciela Husbands.    3 Month  Trend    8 Day Data Trend          Karie Soda, RN 05/18/2022 2:12 PM

## 2022-05-24 ENCOUNTER — Encounter: Payer: Self-pay | Admitting: Cardiovascular Disease
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
## 2022-05-25 ENCOUNTER — Ambulatory Visit: Payer: Medicare PPO | Attending: Cardiovascular Disease | Admitting: Cardiovascular Disease

## 2022-05-25 ENCOUNTER — Ambulatory Visit (INDEPENDENT_AMBULATORY_CARE_PROVIDER_SITE_OTHER): Payer: Medicare PPO

## 2022-05-25 ENCOUNTER — Encounter: Payer: Self-pay | Admitting: Cardiovascular Disease

## 2022-05-25 VITALS — BP 114/60 | HR 61 | Ht 71.0 in | Wt 229.4 lb

## 2022-05-25 DIAGNOSIS — I25118 Atherosclerotic heart disease of native coronary artery with other forms of angina pectoris: Secondary | ICD-10-CM

## 2022-05-25 DIAGNOSIS — R0789 Other chest pain: Secondary | ICD-10-CM | POA: Diagnosis not present

## 2022-05-25 DIAGNOSIS — Z95810 Presence of automatic (implantable) cardiac defibrillator: Secondary | ICD-10-CM | POA: Diagnosis not present

## 2022-05-25 DIAGNOSIS — I5022 Chronic systolic (congestive) heart failure: Secondary | ICD-10-CM

## 2022-05-25 LAB — BASIC METABOLIC PANEL
BUN/Creatinine Ratio: 16 (ref 10–24)
BUN: 16 mg/dL (ref 8–27)
CO2: 26 mmol/L (ref 20–29)
Calcium: 9.4 mg/dL (ref 8.6–10.2)
Chloride: 100 mmol/L (ref 96–106)
Creatinine, Ser: 1.03 mg/dL (ref 0.76–1.27)
Glucose: 165 mg/dL — ABNORMAL HIGH (ref 70–99)
Potassium: 4.6 mmol/L (ref 3.5–5.2)
Sodium: 142 mmol/L (ref 134–144)
eGFR: 73 mL/min/{1.73_m2} (ref >59)

## 2022-05-25 LAB — LIPID PANEL
Chol/HDL Ratio: 4 ratio (ref 0.0–5.0)
Cholesterol, Total: 133 mg/dL (ref 100–199)
HDL: 33 mg/dL — ABNORMAL LOW (ref >39)
LDL Chol Calc (NIH): 72 mg/dL (ref 0–99)
Triglycerides: 164 mg/dL — ABNORMAL HIGH (ref 0–149)
VLDL Cholesterol Cal: 28 mg/dL (ref 5–40)

## 2022-05-25 LAB — ALT: ALT: 26 IU/L (ref 0–44)

## 2022-05-25 NOTE — Patient Instructions (Signed)
Medication Instructions:  Your physician recommends that you continue on your current medications as directed. Please refer to the Current Medication list given to you today.  *If you need a refill on your cardiac medications before your next appointment, please call your pharmacy*   Lab Work: Lipids, ALT, BMET today If you have labs (blood work) drawn today and your tests are completely normal, you will receive your results only by: MyChart Message (if you have MyChart) OR A paper copy in the mail If you have any lab test that is abnormal or we need to change your treatment, we will call you to review the results.   Testing/Procedures: Cardiac PET scan Your physician has requested that you have cardiac CT. Cardiac computed tomography (CT) is a painless test that uses an x-ray machine to take clear, detailed pictures of your heart. For further information please visit [email or web address] Please follow instruction sheet as given.  Follow-Up: At [facility], you and your health needs are our priority.  As part of our continuing mission to provide you with exceptional heart care, we have created designated Provider Care Teams.  These Care Teams include your primary Cardiologist (physician) and Advanced Practice Providers (APPs -  Physician Assistants and Nurse Practitioners) who all work together to provide you with the care you need, when you need it.  Your next appointment:   3 month(s)  The format for your next appointment:   In Person  Provider:   Kristeen Miss, MD      Other Instructions How to Prepare for Your Cardiac PET/CT Stress Test:  1. Please do not take these medications before your test:   Medications that may interfere with the cardiac pharmacological stress agent (ex. nitrates - including erectile dysfunction medications or beta-blockers) the day of the exam. (Erectile dysfunction medication should be held for at least 72 hrs prior to test) Theophylline  containing medications for 12 hours. Dipyridamole 48 hours prior to the test. Your remaining medications may be taken with water.  2. Nothing to eat or drink, except water, 3 hours prior to arrival time.   NO caffeine/decaffeinated products, or chocolate 12 hours prior to arrival.  3. NO perfume, cologne or lotion  4. Total time is 1 to 2 hours; you may want to bring reading material for the waiting time.  5. Please report to Admitting at the [facility] Main Entrance 60 minutes early for your test.  [address] [place], [place] [address]  Diabetic Preparation:  Hold oral medications. You may take NPH and Lantus insulin. Do not take Humalog or Humulin R (Regular Insulin) the day of your test. Check blood sugars prior to leaving the house. If able to eat breakfast prior to 3 hour fasting, you may take all medications, including your insulin, Do not worry if you miss your breakfast dose of insulin - start at your next meal.  IF YOU THINK YOU MAY BE PREGNANT, OR ARE NURSING PLEASE INFORM THE TECHNOLOGIST.  In preparation for your appointment, medication and supplies will be purchased.  Appointment availability is limited, so if you need to cancel or reschedule, please call the Radiology Department at [phone]  24 hours in advance to avoid a cancellation fee of $100.00  What to Expect After you Arrive:  Once you arrive and check in for your appointment, you will be taken to a preparation room within the Radiology Department.  A technologist or Nurse will obtain your medical history, verify that you are  correctly prepped for the exam, and explain the procedure.  Afterwards,  an IV will be started in your arm and electrodes will be placed on your skin for EKG monitoring during the stress portion of the exam. Then you will be escorted to the PET/CT scanner.  There, staff will get you positioned on the scanner and obtain a blood pressure and EKG.  During the exam, you will  continue to be connected to the EKG and blood pressure machines.  A small, safe amount of a radioactive tracer will be injected in your IV to obtain a series of pictures of your heart along with an injection of a stress agent.    After your Exam:  It is recommended that you eat a meal and drink a caffeinated beverage to counter act any effects of the stress agent.  Drink plenty of fluids for the remainder of the day and urinate frequently for the first couple of hours after the exam.  Your doctor will inform you of your test results within 7-10 business days.  For questions about your test or how to prepare for your test, please call: Marchia Bond, Cardiac Imaging Nurse Navigator  Gordy Clement, Cardiac Imaging Nurse Navigator Office: [phone]   Important Information About Sugar

## 2022-05-28 ENCOUNTER — Telehealth: Payer: Self-pay

## 2022-05-28 NOTE — Telephone Encounter (Signed)
The patient daughter Nira Conn agreed to have the pt send missed ICM transmission.

## 2022-05-29 NOTE — Progress Notes (Signed)
EPIC Encounter for ICM Monitoring  Patient Name: [name] is a 81 y.o. male Date: 05/29/2022 Primary Care Physican: Leonard Downing, MD Primary Cardiologist: Nahser Electrophysiologist: Caryl Comes 02/17/2022 Office Weight:  233 lbs 05/12/2022 Weight: 230 lbs 05/18/2022 Weight: 225-230 lbs   Right Ventricular:   93% Left Ventricular:     93%                                                                                     Spoke with patient and heart failure questions reviewed.  Pt reports he is feeling well and had a good visit with Dr Acie Fredrickson.     HeartLogic Heart Failure Index decreased to 19 suggesting fluid levels are returning to normal.   Prescribed: Furosemide 40 mg take 1 tablet by mouth daily Potassium 10 mEq ke 1 tablet by mouth daily Spironolactone 25 mg take 0.5 tablet (12.5 mg total) daily at bedtime   Labs: 05/01/2022 Creatinine 1.36, BUN 19, Potassium 4.2, Sodium 138, GFR 52 03/03/2022 Creatinine 1.15, BUN 16, Potassium 4.4, Sodium 140, GFR 64 10/22/2021 Creatinine 1.15, BUN 15, Potassium 4.6, Sodium 139 10/01/2021 Creatinine 1.20, BUN 20, Potassium 4.2, Sodium 136, GFR >60  A complete set of results can be found in Results Review.   Recommendations:  Pt is taking Lasix as prescribed.  Copy sent to Dr Acie Fredrickson for review for recommendations if needed and for upcoming 9/18 OV.     Follow-up plan: ICM clinic phone appointment on 06/29/2022.   91 day device clinic remote transmission 08/13/2022.     EP/Cardiology Office Visits:  Recall 03/26/2023 with Dr Caryl Comes.    Copy of ICM check sent to Dr. Caryl Comes.    3 month ICM trend: 05/28/2022.     8 day trend:     Rosalene Billings, RN 05/29/2022 1:24 PM

## 2022-05-30 NOTE — Progress Notes (Signed)
Remote ICD transmission.   

## 2022-06-15 ENCOUNTER — Other Ambulatory Visit: Payer: Self-pay | Admitting: Cardiovascular Disease

## 2022-06-29 ENCOUNTER — Ambulatory Visit (INDEPENDENT_AMBULATORY_CARE_PROVIDER_SITE_OTHER): Payer: Medicare PPO

## 2022-06-29 DIAGNOSIS — Z95810 Presence of automatic (implantable) cardiac defibrillator: Secondary | ICD-10-CM

## 2022-06-29 DIAGNOSIS — I5022 Chronic systolic (congestive) heart failure: Secondary | ICD-10-CM

## 2022-06-29 NOTE — Progress Notes (Signed)
EPIC Encounter for ICM Monitoring  Patient Name: [name] is a 81 y.o. male Date: 06/29/2022 Primary Care Physican: Leonard Downing, MD Primary Cardiologist: Nahser Electrophysiologist: Caryl Comes 02/17/2022 Office Weight:  233 lbs 05/12/2022 Weight: 230 lbs 05/18/2022 Weight: 225-230 lbs 06/29/2022 Weight: 222 lbs (wt loss from dieting)   Right and left Ventricular:   92%                                                                              Spoke with daughter and patient and heart failure questions reviewed.  Transmission results reviewed.  Pt is taking antibiotics for chest and sinus infection with a cough    He some swelling in his legs at this time.      HeartLogic Heart Failure Index 25 suggesting possible fluid accumulation starting 10/17.  Thoracic impedance is trending high suggesting fluid levels may be improving.     Prescribed: Furosemide 40 mg take 1 tablet by mouth daily Potassium 10 mEq ke 1 tablet by mouth daily Spironolactone 25 mg take 0.5 tablet (12.5 mg total) daily at bedtime   Labs: 05/01/2022 Creatinine 1.36, BUN 19, Potassium 4.2, Sodium 138, GFR 52 03/03/2022 Creatinine 1.15, BUN 16, Potassium 4.4, Sodium 140, GFR 64 10/22/2021 Creatinine 1.15, BUN 15, Potassium 4.6, Sodium 139 10/01/2021 Creatinine 1.20, BUN 20, Potassium 4.2, Sodium 136, GFR >60  A complete set of results can be found in Results Review.   Recommendations:  Copy sent to Dr Acie Fredrickson for review and recommendations if needed.     Follow-up plan: ICM clinic phone appointment on 07/06/2022 to recheck fluid levels.   91 day device clinic remote transmission 08/13/2022.     EP/Cardiology Office Visits:  Recall 03/26/2023 with Dr Caryl Comes. 09/21/2022 with Dr Acie Fredrickson.   Copy of ICM check sent to Dr. Caryl Comes.    3 Month Trend    8 Day Data Trend          Rosalene Billings, RN 06/29/2022 7:27 AM

## 2022-07-08 NOTE — Progress Notes (Signed)
No ICM remote transmission received for 07/06/2022 and next ICM transmission scheduled for 07/20/2022.

## 2022-07-17 ENCOUNTER — Telehealth (HOSPITAL_COMMUNITY): Payer: Self-pay | Admitting: Emergency Medicine

## 2022-07-17 NOTE — Telephone Encounter (Signed)
Reaching out to patient to offer assistance regarding upcoming cardiac imaging study; pt verbalizes understanding of appt date/time, parking situation and where to check in, pre-test NPO status and medications ordered, and verified current allergies; name and call back number provided for further questions should they arise James Alexandria RN Navigator Cardiac Imaging [name] [phone] office [phone] cell  Arrival [address] [name] [address] Spoke to daughter- verbalized understanding to hold carvedilol, nitro NPO 3 hr prior No caffeine 12 hr Stiffness in shoulders

## 2022-07-20 ENCOUNTER — Ambulatory Visit (INDEPENDENT_AMBULATORY_CARE_PROVIDER_SITE_OTHER): Payer: Medicare PPO

## 2022-07-20 DIAGNOSIS — I5022 Chronic systolic (congestive) heart failure: Secondary | ICD-10-CM | POA: Diagnosis not present

## 2022-07-20 DIAGNOSIS — Z95810 Presence of automatic (implantable) cardiac defibrillator: Secondary | ICD-10-CM

## 2022-07-21 ENCOUNTER — Encounter (HOSPITAL_COMMUNITY)
Admission: RE | Admit: 2022-07-21 | Discharge: 2022-07-21 | Disposition: A | Discharge status: Home / Self Care | Payer: Medicare PPO | Source: Ambulatory Visit | Attending: Cardiovascular Disease | Admitting: Cardiovascular Disease

## 2022-07-21 ENCOUNTER — Encounter (HOSPITAL_COMMUNITY): Payer: Self-pay

## 2022-07-21 DIAGNOSIS — Z95810 Presence of automatic (implantable) cardiac defibrillator: Secondary | ICD-10-CM | POA: Diagnosis present

## 2022-07-21 DIAGNOSIS — I25118 Atherosclerotic heart disease of native coronary artery with other forms of angina pectoris: Secondary | ICD-10-CM | POA: Diagnosis not present

## 2022-07-21 DIAGNOSIS — R0789 Other chest pain: Secondary | ICD-10-CM

## 2022-07-21 LAB — NM PET CT CARDIAC PERFUSION MULTI W/ABSOLUTE BLOODFLOW
LV sys vol: 172 mL
MBFR: 1.83
Nuc Rest EF: 15 %
Nuc Stress EF: 10 %
Rest MBF: 0.53 ml/g/min
ST Depression (mm): 0 mm
Stress MBF: 0.97 ml/g/min

## 2022-07-21 MED ORDER — REGADENOSON 0.4 MG/5ML IV SOLN
INTRAVENOUS | Status: AC
  Administered 2022-07-21: 0.4 mg via INTRAVENOUS
  Filled 2022-07-21: qty 5

## 2022-07-21 MED ORDER — REGADENOSON 0.4 MG/5ML IV SOLN: 0.4000 mg | Freq: Once | INTRAVENOUS | Status: AC

## 2022-07-21 MED ORDER — RUBIDIUM RB82 GENERATOR (RUBYFILL)
25.0000 | PACK | Freq: Once | INTRAVENOUS | Status: AC
  Administered 2022-07-21: 26.9 via INTRAVENOUS

## 2022-07-21 NOTE — Progress Notes (Signed)
Pt arrived to PET scan ambulatory and reports following instructions for test. No caffeine, no food, held meds 20g PIV to L AC PVCs to EKG

## 2022-07-21 NOTE — Progress Notes (Signed)
Pt tolerated exam without incident; pt denies lightheadedness or dizziness; pt ambulatory to lobby steady gait noted  

## 2022-07-23 ENCOUNTER — Telehealth: Payer: Self-pay

## 2022-07-23 NOTE — Telephone Encounter (Signed)
Transmission received 07/23/2022

## 2022-07-24 NOTE — Progress Notes (Signed)
EPIC Encounter for ICM Monitoring  Patient Name: [name] is a 81 y.o. male Date: 07/24/2022 Primary Care Physican: Kaleen Mask, MD Primary Cardiologist: Nahser Electrophysiologist: Graciela Husbands 02/17/2022 Office Weight:  233 lbs 05/12/2022 Weight: 230 lbs 05/18/2022 Weight: 225-230 lbs 06/29/2022 Weight: 222 lbs (wt loss from dieting)   Right and left Ventricular:   92%                                                                              Attempted call to patient and unable to reach.  Left detailed message per DPR regarding transmission. Transmission reviewed.    HeartLogic Heart Failure Index 2 suggesting fluid levels returned to normal.     Prescribed: Furosemide 40 mg take 1 tablet by mouth daily Potassium 10 mEq ke 1 tablet by mouth daily Spironolactone 25 mg take 0.5 tablet (12.5 mg total) daily at bedtime   Labs: 05/01/2022 Creatinine 1.36, BUN 19, Potassium 4.2, Sodium 138, GFR 52 03/03/2022 Creatinine 1.15, BUN 16, Potassium 4.4, Sodium 140, GFR 64 10/22/2021 Creatinine 1.15, BUN 15, Potassium 4.6, Sodium 139 10/01/2021 Creatinine 1.20, BUN 20, Potassium 4.2, Sodium 136, GFR >60  A complete set of results can be found in Results Review.   Recommendations:  Left voice mail with ICM number and encouraged to call if experiencing any fluid symptoms.   Follow-up plan: ICM clinic phone appointment on 08/17/2022.   91 day device clinic remote transmission 08/13/2022.     EP/Cardiology Office Visits:  Recall 03/26/2023 with Dr Graciela Husbands. 09/21/2022 with Dr Elease Hashimoto.   Copy of ICM check sent to Dr. Graciela Husbands.    3 Month Trend    8 Day Data Trend          Karie Soda, RN 07/24/2022 5:12 PM

## 2022-07-28 ENCOUNTER — Other Ambulatory Visit: Payer: Self-pay | Admitting: Family

## 2022-07-28 DIAGNOSIS — Z7901 Long term (current) use of anticoagulants: Secondary | ICD-10-CM

## 2022-07-28 DIAGNOSIS — I4819 Other persistent atrial fibrillation: Secondary | ICD-10-CM

## 2022-07-28 NOTE — Telephone Encounter (Signed)
Please review for refill. Thank you! 

## 2022-07-28 NOTE — Telephone Encounter (Signed)
Eliquis 5mg  refill request received. Patient is 81 years old, weight-104.1kg, Crea-1.03 on 05/25/2022, Diagnosis-Afib, and last seen by Dr. 05/27/2022 on 05/25/2022. Dose is appropriate based on dosing criteria. Will send in refill to requested pharmacy.

## 2022-08-13 ENCOUNTER — Ambulatory Visit (INDEPENDENT_AMBULATORY_CARE_PROVIDER_SITE_OTHER): Payer: Medicare PPO

## 2022-08-13 ENCOUNTER — Other Ambulatory Visit: Payer: Self-pay | Admitting: Cardiovascular Disease

## 2022-08-13 DIAGNOSIS — I255 Ischemic cardiomyopathy: Secondary | ICD-10-CM

## 2022-08-13 LAB — CUP PACEART REMOTE DEVICE CHECK
Battery Remaining Longevity: 90 mo
Battery Remaining Percentage: 100 %
Brady Statistic RA Percent Paced: 77 %
Brady Statistic RV Percent Paced: 91 %
Date Time Interrogation Session: 20231207072400
HighPow Impedance: 46 Ohm
Implantable Lead Connection Status: 753985
Implantable Lead Connection Status: 753985
Implantable Lead Connection Status: 753985
Implantable Lead Implant Date: 20040315
Implantable Lead Implant Date: 20100611
Implantable Lead Implant Date: 20100611
Implantable Lead Location: 753858
Implantable Lead Location: 753859
Implantable Lead Location: 753860
Implantable Lead Model: 4196
Implantable Lead Model: 5076
Implantable Lead Model: 6947
Implantable Pulse Generator Implant Date: 20230308
Lead Channel Impedance Value: 451 Ohm
Lead Channel Impedance Value: 468 Ohm
Lead Channel Impedance Value: 501 Ohm
Lead Channel Pacing Threshold Amplitude: 0.6 V
Lead Channel Pacing Threshold Amplitude: 1 V
Lead Channel Pacing Threshold Amplitude: 2.7 V
Lead Channel Pacing Threshold Pulse Width: 0.4 ms
Lead Channel Pacing Threshold Pulse Width: 0.4 ms
Lead Channel Pacing Threshold Pulse Width: 1.5 ms
Lead Channel Setting Pacing Amplitude: 2 V
Lead Channel Setting Pacing Amplitude: 2.5 V
Lead Channel Setting Pacing Amplitude: 3 V
Lead Channel Setting Pacing Pulse Width: 0.4 ms
Lead Channel Setting Pacing Pulse Width: 1.5 ms
Lead Channel Setting Sensing Sensitivity: 0.5 mV
Lead Channel Setting Sensing Sensitivity: 1 mV
Pulse Gen Serial Number: 152344

## 2022-08-13 NOTE — Telephone Encounter (Signed)
Rx refill sent to pharmacy. 

## 2022-08-17 ENCOUNTER — Ambulatory Visit (INDEPENDENT_AMBULATORY_CARE_PROVIDER_SITE_OTHER): Payer: Medicare PPO

## 2022-08-17 DIAGNOSIS — I5022 Chronic systolic (congestive) heart failure: Secondary | ICD-10-CM | POA: Diagnosis not present

## 2022-08-17 DIAGNOSIS — Z95810 Presence of automatic (implantable) cardiac defibrillator: Secondary | ICD-10-CM | POA: Diagnosis not present

## 2022-08-17 NOTE — Progress Notes (Unsigned)
EPIC Encounter for ICM Monitoring  Patient Name: [name] is a 81 y.o. male Date: 08/17/2022 Primary Care Physican: Kaleen Mask, MD Primary Cardiologist: Nahser Electrophysiologist: Graciela Husbands 02/17/2022 Office Weight:  233 lbs 05/12/2022 Weight: 230 lbs 05/18/2022 Weight: 225-230 lbs 06/29/2022 Weight: 222 lbs (wt loss from dieting) 08/17/2022 Weight: 225 lbs                                                                             Spoke with patient and heart failure questions reviewed.  Transmission results reviewed.  Pt asymptomatic for fluid accumulation.  Reports feeling well at this time and voices no complaints.     Diet:  He eats restaurant food once a week.  He eats sweets and some snacks.  Does not limit salt intake.   HeartLogic Heart Failure Index 19 suggesting possible fluid accumulation starting 11/20.   Normal fluid level HF index should be <12.   Prescribed: Furosemide 40 mg take 1 tablet by mouth daily Potassium 10 mEq ke 1 tablet by mouth daily Spironolactone 25 mg take 0.5 tablet (12.5 mg total) daily at bedtime   Labs: 05/25/2022 Creatinine 1.03, BUN 16, Potassium 4.6, Sodium 142, GFR 73 05/04/2022 Creatinine 1.20, BUN 17, Potassium 4.4, Sodium  05/01/2022 Creatinine 1.36, BUN 19, Potassium 4.2, Sodium 138, GFR 52 03/03/2022 Creatinine 1.15, BUN 16, Potassium 4.4, Sodium 140, GFR 64 10/22/2021 Creatinine 1.15, BUN 15, Potassium 4.6, Sodium 139 10/01/2021 Creatinine 1.20, BUN 20, Potassium 4.2, Sodium 136, GFR >60  A complete set of results can be found in Results Review.   Recommendations:  Recommendation to limit salt intake to 2000 mg daily and fluid intake to 64 oz daily.  Encouraged to call if experiencing any fluid symptoms.    Follow-up plan: ICM clinic phone appointment on 08/24/2022 to recheck fluid levels.   91 day device clinic remote transmission 11/12/2022.     EP/Cardiology Office Visits:  Recall 03/26/2023 with Dr Graciela Husbands. 09/21/2022 with  Dr Elease Hashimoto.   Copy of ICM check sent to Dr. Graciela Husbands.  Copy sent to Dr Elease Hashimoto for review and recommendations if needed.    3 Month Trend    8 Day Data Trend          Karie Soda, RN 08/17/2022 10:45 AM

## 2022-08-19 NOTE — Progress Notes (Signed)
No recommendations from Dr Elease Hashimoto received at this time.  Recheck fluid levels 12/18.

## 2022-08-24 ENCOUNTER — Ambulatory Visit (INDEPENDENT_AMBULATORY_CARE_PROVIDER_SITE_OTHER): Payer: Medicare PPO

## 2022-08-24 DIAGNOSIS — I5022 Chronic systolic (congestive) heart failure: Secondary | ICD-10-CM

## 2022-08-24 DIAGNOSIS — Z95810 Presence of automatic (implantable) cardiac defibrillator: Secondary | ICD-10-CM

## 2022-08-26 NOTE — Progress Notes (Signed)
EPIC Encounter for ICM Monitoring  Patient Name: [name] is a 81 y.o. male Date: 08/26/2022 Primary Care Physican: Kaleen Mask, MD Primary Cardiologist: Nahser Electrophysiologist: Graciela Husbands 02/17/2022 Office Weight:  233 lbs 05/12/2022 Weight: 230 lbs 05/18/2022 Weight: 225-230 lbs 06/29/2022 Weight: 222 lbs (wt loss from dieting) 08/17/2022 Weight: 225 lbs 08/26/2022 Weight: 224 lbs                                                                        Spoke with patient and heart failure questions reviewed.  Transmission results reviewed.  Pt asymptomatic for fluid accumulation.  Reports feeling well at this time and voices no complaints.     Diet:  He eats restaurant food once a week.  He eats sweets and some snacks.  Does not limit salt intake.    HeartLogic Heart Failure Index decreased from 21 to 14 suggesting fluid levels close to normal.   Normal fluid level HF index should be <12.   Prescribed: Furosemide 40 mg take 1 tablet by mouth daily Potassium 10 mEq ke 1 tablet by mouth daily Spironolactone 25 mg take 0.5 tablet (12.5 mg total) daily at bedtime   Labs: 05/25/2022 Creatinine 1.03, BUN 16, Potassium 4.6, Sodium 142, GFR 73 05/04/2022 Creatinine 1.20, BUN 17, Potassium 4.4, Sodium  05/01/2022 Creatinine 1.36, BUN 19, Potassium 4.2, Sodium 138, GFR 52 03/03/2022 Creatinine 1.15, BUN 16, Potassium 4.4, Sodium 140, GFR 64 10/22/2021 Creatinine 1.15, BUN 15, Potassium 4.6, Sodium 139 10/01/2021 Creatinine 1.20, BUN 20, Potassium 4.2, Sodium 136, GFR >60  A complete set of results can be found in Results Review.   Recommendations:  No changes and encouraged to call if experiencing any fluid symptoms.   Follow-up plan: ICM clinic phone appointment on 09/28/2022.   91 day device clinic remote transmission 11/12/2022.     EP/Cardiology Office Visits:  Recall 03/26/2023 with Dr Graciela Husbands. 09/21/2022 with Dr Elease Hashimoto.   Copy of ICM check sent to Dr. Graciela Husbands.  3 Month  Trend    8 Day Data Trend          Karie Soda, RN 08/26/2022 8:20 AM

## 2022-09-04 NOTE — Progress Notes (Signed)
Remote ICD transmission.   

## 2022-09-14 ENCOUNTER — Telehealth: Payer: Self-pay

## 2022-09-14 NOTE — Telephone Encounter (Signed)
Device alert for persistent AF since 12/19, overall controlled rates Burden 15%, Eliquis HL=11 Route to triage for persistent AF LA  Patient has known AF, on [place], V rates controlled. Flagging HL score for Sharman Cheek, RN.  Note HL score is down slightly from mid-December.  Sees Dr. Acie Fredrickson on 09/21/22. Will forward to EP scheduler to get in with Dr. Caryl Comes, due in February.

## 2022-09-20 ENCOUNTER — Encounter: Payer: Self-pay | Admitting: Cardiovascular Disease
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
## 2022-09-21 ENCOUNTER — Ambulatory Visit: Payer: Medicare PPO | Attending: Cardiovascular Disease | Admitting: Cardiovascular Disease

## 2022-09-21 ENCOUNTER — Encounter: Payer: Self-pay | Admitting: Cardiovascular Disease

## 2022-09-21 VITALS — BP 98/54 | HR 66 | Ht 71.0 in | Wt 222.8 lb

## 2022-09-21 DIAGNOSIS — I2510 Atherosclerotic heart disease of native coronary artery without angina pectoris: Secondary | ICD-10-CM

## 2022-09-21 DIAGNOSIS — I4819 Other persistent atrial fibrillation: Secondary | ICD-10-CM

## 2022-09-21 DIAGNOSIS — I5022 Chronic systolic (congestive) heart failure: Secondary | ICD-10-CM | POA: Diagnosis not present

## 2022-09-21 NOTE — Patient Instructions (Addendum)
Medication Instructions:  STOP ASPIRIN *If you need a refill on your cardiac medications before your next appointment, please call your pharmacy*   Lab Work: NONE If you have labs (blood work) drawn today and your tests are completely normal, you will receive your results only by: [place] (if you have MyChart) OR A paper copy in the mail If you have any lab test that is abnormal or we need to change your treatment, we will call you to review the results.   Testing/Procedures: NONE   Follow-Up: At [facility], you and your health needs are our priority.  As part of our continuing mission to provide you with exceptional heart care, we have created designated Provider Care Teams.  These Care Teams include your primary Cardiologist (physician) and Advanced Practice Providers (APPs -  Physician Assistants and Nurse Practitioners) who all work together to provide you with the care you need, when you need it.  We recommend signing up for the patient portal called "MyChart".  Sign up information is provided on this After Visit Summary.  MyChart is used to connect with patients for Virtual Visits (Telemedicine).  Patients are able to view lab/test results, encounter notes, upcoming appointments, etc.  Non-urgent messages can be sent to your provider as well.   To learn more about what you can do with MyChart, go to [email or web address].    Your next appointment:   6 month(s)  Provider:   Christen Bame NP    Other Instructions NONE

## 2022-09-28 ENCOUNTER — Ambulatory Visit: Payer: Medicare PPO | Attending: Internal Medicine

## 2022-09-28 DIAGNOSIS — Z95810 Presence of automatic (implantable) cardiac defibrillator: Secondary | ICD-10-CM

## 2022-09-28 DIAGNOSIS — I5022 Chronic systolic (congestive) heart failure: Secondary | ICD-10-CM | POA: Diagnosis not present

## 2022-09-29 ENCOUNTER — Other Ambulatory Visit: Payer: Self-pay | Admitting: *Deleted

## 2022-09-29 MED ORDER — FUROSEMIDE 40 MG PO TABS: 40.0000 mg | ORAL_TABLET | Freq: Every day | ORAL | 3 refills | Status: DC

## 2022-09-29 MED ORDER — POTASSIUM CHLORIDE ER 10 MEQ PO TBCR: 10.0000 meq | EXTENDED_RELEASE_TABLET | Freq: Every day | ORAL | 3 refills | Status: DC

## 2022-10-02 NOTE — Progress Notes (Signed)
EPIC Encounter for ICM Monitoring  Patient Name: [name] is a 82 y.o. male Date: 10/02/2022 Primary Care Physican: Leonard Downing, MD Primary Cardiologist: Nahser Electrophysiologist: Caryl Comes 02/17/2022 Office Weight:  233 lbs 05/12/2022 Weight: 230 lbs 05/18/2022 Weight: 225-230 lbs 06/29/2022 Weight: 222 lbs (wt loss from dieting) 08/17/2022 Weight: 225 lbs 08/26/2022 Weight: 224 lbs  Time in AT/AF 24.0 hours AT/AF 25 %                                                                        Spoke with patient and heart failure questions reviewed.  Transmission results reviewed.  Pt asymptomatic for fluid accumulation.  Reports feeling well at this time and voices no complaints.     Diet:  He eats restaurant food once a week.  He eats sweets and some snacks.  Does not limit salt intake.    HeartLogic Heart Failure Index 7 suggesting normal fluid levels.   Prescribed: Furosemide 40 mg take 1 tablet by mouth daily Potassium 10 mEq ke 1 tablet by mouth daily Spironolactone 25 mg take 0.5 tablet (12.5 mg total) daily at bedtime   Labs: 05/25/2022 Creatinine 1.03, BUN 16, Potassium 4.6, Sodium 142, GFR 73 05/04/2022 Creatinine 1.20, BUN 17, Potassium 4.4, Sodium  05/01/2022 Creatinine 1.36, BUN 19, Potassium 4.2, Sodium 138, GFR 52 03/03/2022 Creatinine 1.15, BUN 16, Potassium 4.4, Sodium 140, GFR 64 10/22/2021 Creatinine 1.15, BUN 15, Potassium 4.6, Sodium 139 10/01/2021 Creatinine 1.20, BUN 20, Potassium 4.2, Sodium 136, GFR >60  A complete set of results can be found in Results Review.   Recommendations:  No changes and encouraged to call if experiencing any fluid symptoms.   Follow-up plan: ICM clinic phone appointment on 11/02/2022.   91 day device clinic remote transmission 11/12/2022.     EP/Cardiology Office Visits:  Recall 03/26/2023 with Dr Caryl Comes.    Copy of ICM check sent to Dr. Caryl Comes.  3 Month HeartLogicT Heart Failure Index:    8 Day Data Trend:           Rosalene Billings, RN 10/02/2022 4:28 PM

## 2022-10-09 ENCOUNTER — Other Ambulatory Visit: Payer: Self-pay

## 2022-10-09 NOTE — Telephone Encounter (Signed)
[facility] mail order pharmacy is requesting a refill on Levothyroxine. Would Dr. Acie Fredrickson like to refill this medication? Please address

## 2022-10-14 ENCOUNTER — Encounter: Payer: Medicare PPO | Attending: Nurse Practitioner | Admitting: Registered"

## 2022-10-14 ENCOUNTER — Encounter: Payer: Self-pay | Admitting: Registered"

## 2022-10-14 DIAGNOSIS — E119 Type 2 diabetes mellitus without complications: Secondary | ICD-10-CM | POA: Diagnosis present

## 2022-10-14 NOTE — Patient Instructions (Addendum)
Your mouth blisters might be related to food allergies People who are allergic to grass pollen can have an OAS reaction to tomatoes, peaches, celery, melons, or potatoes. [email or web address]  When eating oatmeal only use blueberries instead of the other fruits and peanuts that you have been using  Since cantaloupe might be bothering you, bananas might be a problem too.  Stop drinking orange juice  Consider eating more vegetables, maybe ask daughter how she prepares okra so you can make at home  Continue using olive oil instead of butter or lard.  Add more protein to your diet. Tuna fish, salmon patties, eggs, nuts, Greek yogurt, chicken  Use 100% Whole Grain bread, crackers and cereal.

## 2022-10-14 NOTE — Progress Notes (Signed)
Diabetes Self-Management Education  Visit Type: First/Initial  Appt. Start Time: 1530 Appt. End Time: 1640  10/14/2022  Mr. [name], identified by name and date of birth, is a 82 y.o. male with a diagnosis of Diabetes: Type 2.   ASSESSMENT  There were no vitals taken for this visit. There is no height or weight on file to calculate BMI.  A1c 6.6%  DM Medication: metformin - pt states he started the medication but then was having some a-fib and was concerned that the Metformin contributed to a-fib so he stopped. Pt states he has appt with cardiologist tomorrow and will ask about the metformin then  Pt states since Dx he has stopped drinking soda, switched to whole grain bread, olive oil, gave the candy in his house to his grandson. Pt states he needs to do better with eating more vegetables. Pt reports sometimes he will just eat rice for a meal because he doesn't want to bother cooking other food just for himself. Pt states he was told to eat frozen vegetables instead of canned to lower sodium intake. Pt states he didn't think it was a good idea because frozen foods are high in sodium.   Pt reports he has always loved sweets and it was hard to give up soda. Pt states he has been drinking a lot of orange juice. Pt states the juice may be aggravating the blisters in his mouth.  Pt main concern at this time is the blisters in his mouth. Pt reports several foods that precipitates sxs: tomatoes, peanuts cantaloupe, strawberries. Pt states they are very painful and only thing he has found that helps is to swab with hydrogen peroxide.  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 Pt had c/o itching all over x1-2 weeks (no rash). Pt states he stopped eating beef, pork, lamb and after that appointment and sxs have resolved. Pt states it has been difficult to avoid these meats and he has lost 10 lbs. Pt states he has been eating chicken, eggs, fish, poultry  Pt reports of  paint in knees and hips, has not been walking much d/t pain. Pt reports he was having trouble with stomach, pain starts in back and hips pain goes around to stomach. Pt states he plans to reports sxs at next orthopedic visit.  Pt wants to wait for cardiologist appt before deciding if he wants another diabetes education visit.   Diabetes Self-Management Education - 10/14/22 1500       Visit Information   Visit Type First/Initial      Initial Visit   Diabetes Type Type 2    Date Diagnosed Dec 2023    Are you currently following a meal plan? Yes   alpha gal - no meat   What type of meal plan do you follow? no mammal meal    Are you taking your medications as prescribed? No      Psychosocial Assessment   Patient Belief/Attitude about Diabetes Motivated to manage diabetes    How often do you need to have someone help you when you read instructions, pamphlets, or other written materials from your doctor or pharmacy? 1 - Never      Complications   Last HgB A1C per patient/outside source 6.6 %   referral lab   How often do you check your blood sugar? 0 times/day (not testing)    Have you had a dilated eye exam in the past 12 months? Yes    Have you  had a dental exam in the past 12 months? Yes    Are you checking your feet? Yes    How many days per week are you checking your feet? 2      Dietary Intake   Breakfast oatmeal, peanuts, berries, cantoupe    Snack (morning) snickers    Dinner green beans, slaw, fried onion rings    Snack (evening) apple or orange    Beverage(s) hot tea, oj, milk, cranberry juice      Activity / Exercise   Activity / Exercise Type Light (walking / raking leaves)    How many days per week do you exercise? 5    How many minutes per day do you exercise? 10    Total minutes per week of exercise 50      Patient Education   Previous Diabetes Education No    Healthy Eating Plate Method      Individualized Goals (developed by patient)   Nutrition General  guidelines for healthy choices and portions discussed      Outcomes   Expected Outcomes Demonstrated interest in learning. Expect positive outcomes    Future DMSE PRN    Program Status Not Completed             Individualized Plan for Diabetes Self-Management Training:   Learning Objective:  Patient will have a greater understanding of diabetes self-management. Patient education plan is to attend individual and/or group sessions per assessed needs and concerns.  Patient Instructions  Your mouth blisters might be related to food allergies People who are allergic to grass pollen can have an OAS reaction to tomatoes, peaches, celery, melons, or potatoes. [email or web address]  When eating oatmeal only use blueberries instead of the other fruits and peanuts that you have been using  Since cantaloupe might be bothering you, bananas might be a problem too.  Stop drinking orange juice  Consider eating more vegetables, maybe ask daughter how she prepares okra so you can make at home  Continue using olive oil instead of butter or lard.  Add more protein to your diet. Tuna fish, salmon patties, eggs, nuts, Greek yogurt, chicken  Use 100% Whole Grain bread, crackers and cereal.   Expected Outcomes:  Demonstrated interest in learning. Expect positive outcomes  Education material provided: My Plate, Food label  If problems or questions, patient to contact team via:  Phone  Future DSME appointment: PRN

## 2022-10-19 ENCOUNTER — Ambulatory Visit: Payer: Medicare PPO | Attending: Internal Medicine | Admitting: Internal Medicine

## 2022-10-19 ENCOUNTER — Encounter: Payer: Self-pay | Admitting: Internal Medicine

## 2022-10-19 VITALS — BP 124/68 | HR 70 | Ht 71.0 in | Wt 219.0 lb

## 2022-10-19 DIAGNOSIS — Z95810 Presence of automatic (implantable) cardiac defibrillator: Secondary | ICD-10-CM | POA: Diagnosis not present

## 2022-10-19 DIAGNOSIS — I255 Ischemic cardiomyopathy: Secondary | ICD-10-CM | POA: Diagnosis not present

## 2022-10-19 DIAGNOSIS — I4819 Other persistent atrial fibrillation: Secondary | ICD-10-CM

## 2022-10-19 DIAGNOSIS — I5022 Chronic systolic (congestive) heart failure: Secondary | ICD-10-CM

## 2022-10-19 NOTE — Progress Notes (Signed)
Patient ID: [name], male   DOB: 07/06/1941, 82 y.o.   MRN: [id number]     Patient Care Team: Leonard Downing, MD as PCP - General Nahser, Wonda Cheng, MD as PCP - Cardiology (Cardiology) Deboraha Sprang, MD as PCP - Electrophysiology (Cardiology) Freeman Caldron, PA-C as Physician Assistant (Urology)   HPI  [name] is a 82 y.o. male seen in followup for  CRT-D upgrade for congestive heart failure in the setting of ischemic heart disease with device generator replacement  2/16; 3/23.  Persistent atrial fib on apixoban and amiodarone. no bleeding  Patient denies symptoms of respiratory, GI intolerance, sun sensitivity, neurological symptoms attributable to amiodarone.     Reverted to afib, but had not noted any significant improvement following DCCV 9/23  Felt lousy these last months, dyspnea, at baseline, but GI issues and orthopedic back and hip          He spends more time inside the house than outside since his wife died 69; childrens are struggling with related concerns    wife -[name]       DATE TEST EF%   1/10 Echo   30-35 %   3/17 Echo  35-40 %   9/20 Echo 25-30%   9/20 TEE 25-30%   3/21 Echo 30-35%   4/21 Myoview  23% Fixed perfusion defect-Anteroseptal No ischemia   Date Cr K Hgb TSH ALT  7/19 1.02 4.2 15.3]    11/20 1.27 4.6 15.8    9/21  1.08 4.4     2/22 1.23 4.2 15.3 8.36   9/22 1.11 4.3 16.2 6.48 (7/22) 18  2/23 1.15 4.3 13.4    6/ 23 1.15 4.4 14.9            Wife passed away in Dec 15, 2015   Past Medical History:  Diagnosis Date   AICD (automatic cardioverter/defibrillator) present    Arthritis    knees, back    BPH (benign prostatic hypertrophy)    CHF (congestive heart failure) ([place])    Chronic kidney disease    BPH   Chronic systolic heart failure ([place])    a. s/p MDT single chamber ICD 12/15/02 as part of MASTER study b. upgrade to CRTD Dec 14, 2008; CRTD gen change December 15, 2014   Coronary artery disease    a. s/p anterior MI and CYPHER stent  to LAD 2003/12/15   Dyslipidemia    Dysrhythmia    a-fib   Gout    Ischemic cardiomyopathy    Paroxysmal atrial fibrillation ([place])    Ventricular tachycardia ([place])     Past Surgical History:  Procedure Laterality Date   APPENDECTOMY  1970   BI-VENTRICULAR IMPLANTABLE CARDIOVERTER DEFIBRILLATOR UPGRADE N/A 11/07/2014   a. MDT single chamber ICD implanted 2002/12/15 as part of MASTER study; upgrade to CRTD 2010; gen change 2014/12/15   BIV ICD GENERATOR CHANGEOUT N/A 11/12/2021   Procedure: BIV ICD GENERATOR CHANGEOUT;  Surgeon: Deboraha Sprang, MD;  Location: [facility];  Service: Cardiovascular;  Laterality: N/A;   CARDIAC CATHETERIZATION  09/08/2003   x3 total of 5 stents   CARDIOVERSION N/A 07/04/2019   Procedure: CARDIOVERSION;  Surgeon: Josue Hector, MD;  Location: [facility] ENDOSCOPY;  Service: Cardiovascular;  Laterality: N/A;   CARDIOVERSION N/A 08/17/2019   Procedure: CARDIOVERSION;  Surgeon: Thayer Headings, MD;  Location: [facility] ENDOSCOPY;  Service: Cardiovascular;  Laterality: N/A;   CARDIOVERSION N/A 05/04/2022   Procedure: CARDIOVERSION;  Surgeon: Sueanne Margarita, MD;  Location: [facility]  ENDOSCOPY;  Service: Cardiovascular;  Laterality: N/A;   PROSTATECTOMY  11/06/2011   Procedure: PROSTATECTOMY SUPRAPUBIC;  Surgeon: Ailene Rud, MD;  Location: [facility];  Service: Urology;  Laterality: N/A;  Open Suprapubic Prostatectomy   TEE WITHOUT CARDIOVERSION N/A 05/16/2019   Procedure: TRANSESOPHAGEAL ECHOCARDIOGRAM (TEE);  Surgeon: Geralynn Rile, MD;  Location: [facility];  Service: Cardiology;  Laterality: N/A;   TOTAL KNEE ARTHROPLASTY Left 10/06/2021   Procedure: TOTAL KNEE ARTHROPLASTY;  Surgeon: Dorna Leitz, MD;  Location: [facility];  Service: Orthopedics;  Laterality: Left;    Current Outpatient Medications  Medication Sig Dispense Refill   allopurinol (ZYLOPRIM) 100 MG tablet Take 100 mg by mouth at bedtime.     amiodarone (PACERONE) 200 MG tablet Take 0.5 tablets (100 mg total) by  mouth daily. 45 tablet 2   apixaban (ELIQUIS) 5 MG TABS tablet TAKE 1 TABLET TWICE DAILY 180 tablet 2   carvedilol (COREG) 3.125 MG tablet TAKE 1 TABLET TWICE DAILY 180 tablet 2   EPINEPHrine 0.3 mg/0.3 mL IJ SOAJ injection Inject 0.3 mg into the muscle as needed.     furosemide (LASIX) 40 MG tablet Take 1 tablet (40 mg total) by mouth daily. 90 tablet 3   latanoprost (XALATAN) 0.005 % ophthalmic solution Place 1 drop into both eyes at bedtime.      levothyroxine (SYNTHROID) 125 MCG tablet Take 125 mcg by mouth daily before breakfast.     Lysine 1000 MG TABS Take 1 tablet by mouth daily in the afternoon.     metFORMIN (GLUCOPHAGE) 500 MG tablet Take 500 mg by mouth daily.     nitroGLYCERIN (NITROSTAT) 0.4 MG SL tablet Place 1 tablet (0.4 mg total) under the tongue every 5 (five) minutes as needed for chest pain. 30 tablet 3   Polyethyl Glycol-Propyl Glycol 0.4-0.3 % SOLN Place 1 drop into both eyes daily.     potassium chloride (KLOR-CON) 10 MEQ tablet Take 1 tablet (10 mEq total) by mouth daily. 90 tablet 3   rosuvastatin (CRESTOR) 10 MG tablet Take 0.5 tablets (5 mg total) by mouth daily. 45 tablet 1   tiZANidine (ZANAFLEX) 2 MG tablet Take 2 mg by mouth at bedtime.     TURMERIC PO Take 1 tablet by mouth daily in the afternoon.     No current facility-administered medications for this visit.    Allergies  Allergen Reactions   Lipitor [Atorvastatin] Other (See Comments)    MUSCLE ACHE    Jardiance [Empagliflozin] Other (See Comments)    Intolerance    Physical Exam: BP 124/68   Pulse 70   Ht 5' 11"$  (1.803 m)   Wt 219 lb (99.3 kg)   SpO2 96%   BMI 30.54 kg/m   Well developed and well nourished in no acute distress HENT normal Neck supple with JVP-flat Clear Device pocket well healed; without hematoma or erythema.  There is no tethering  Regular rate and rhythm, no  murmur Abd-soft with active BS No Clubbing cyanosis edema Skin-warm and dry A & Oriented  Grossly normal  sensory and motor function  ECG    Device function is  normal. Programming changes inactivate HIGH VOLTAGE THERAPIES a reprogrammed to VVIR see Paceart for details    ECG AV pacing at 60 QRS morphology Rs lead V1 Qr lead I     Assessment and  Plan:  Atrial fibrillation persistent   Ischemic cardiomyopathy and prior stenting  Hypotension precluding ACE/ARB/ASNI/MRA reexpose 6/23  Orthostatic weakness-- without evidence of  orthostatic hypotension  Implantable defibrillator [employer]-CRT with elevated LV threshold     Lengthy discussion, we will inactivate high-voltage therapies.  He would also like a DNR order.  We will reprogram his device also to VVIR to try to augment ventricular pacing at the greater than basal rates.  [place].  No chest pain.  Continue anti-ischemic therapies

## 2022-10-19 NOTE — Patient Instructions (Signed)
Medication Instructions:  Your physician has recommended you make the following change in your medication:   ** Stop Amiodarone  *If you need a refill on your cardiac medications before your next appointment, please call your pharmacy*   Lab Work: None ordered.  If you have labs (blood work) drawn today and your tests are completely normal, you will receive your results only by: [place] (if you have MyChart) OR A paper copy in the mail If you have any lab test that is abnormal or we need to change your treatment, we will call you to review the results.   Testing/Procedures: None ordered.    Follow-Up: At [facility], you and your health needs are our priority.  As part of our continuing mission to provide you with exceptional heart care, we have created designated Provider Care Teams.  These Care Teams include your primary Cardiologist (physician) and Advanced Practice Providers (APPs -  Physician Assistants and Nurse Practitioners) who all work together to provide you with the care you need, when you need it.  We recommend signing up for the patient portal called "MyChart".  Sign up information is provided on this After Visit Summary.  MyChart is used to connect with patients for Virtual Visits (Telemedicine).  Patients are able to view lab/test results, encounter notes, upcoming appointments, etc.  Non-urgent messages can be sent to your provider as well.   To learn more about what you can do with MyChart, go to [email or web address].    Your next appointment:   6 months with Dr Caryl Comes

## 2022-10-21 ENCOUNTER — Ambulatory Visit (INDEPENDENT_AMBULATORY_CARE_PROVIDER_SITE_OTHER): Payer: Medicare PPO

## 2022-10-21 ENCOUNTER — Telehealth: Payer: Self-pay

## 2022-10-21 DIAGNOSIS — I5022 Chronic systolic (congestive) heart failure: Secondary | ICD-10-CM

## 2022-10-21 DIAGNOSIS — Z95810 Presence of automatic (implantable) cardiac defibrillator: Secondary | ICD-10-CM

## 2022-10-21 LAB — CUP PACEART INCLINIC DEVICE CHECK
Battery Remaining Longevity: 72 mo
Brady Statistic RA Percent Paced: 26 %
Brady Statistic RV Percent Paced: 86 %
Date Time Interrogation Session: 20240212085138
HighPow Impedance: 51 Ohm
Implantable Lead Connection Status: 753985
Implantable Lead Connection Status: 753985
Implantable Lead Connection Status: 753985
Implantable Lead Implant Date: 20040315
Implantable Lead Implant Date: 20100611
Implantable Lead Implant Date: 20100611
Implantable Lead Location: 753858
Implantable Lead Location: 753859
Implantable Lead Location: 753860
Implantable Lead Model: 4196
Implantable Lead Model: 5076
Implantable Lead Model: 6947
Implantable Pulse Generator Implant Date: 20230308
Lead Channel Impedance Value: 467 Ohm
Lead Channel Impedance Value: 505 Ohm
Lead Channel Impedance Value: 523 Ohm
Lead Channel Pacing Threshold Amplitude: 0.8 V
Lead Channel Pacing Threshold Amplitude: 2.4 V
Lead Channel Pacing Threshold Pulse Width: 0.4 ms
Lead Channel Pacing Threshold Pulse Width: 1.5 ms
Lead Channel Sensing Intrinsic Amplitude: 0.5 mV
Lead Channel Sensing Intrinsic Amplitude: 23.9 mV
Lead Channel Sensing Intrinsic Amplitude: 7.6 mV
Pulse Gen Serial Number: 152344

## 2022-10-21 NOTE — Progress Notes (Signed)
Spoke with daughter Nira Conn and advised Dr Caryl Comes recommended to take Lasix 80 mg daily x 3 days and Potassium 2 tablets daily x 3 days.  After 3rd day, go back to prescribed dosages of Lasix 40 mg daily and Potassium 10 meq daily.  Advised if patient has any side effects of taking extra dosages, resume prescribed dosage and call back.  She agreed to plan and will inform patient on recommendations.

## 2022-10-21 NOTE — Progress Notes (Signed)
Per Secure message:

## 2022-10-21 NOTE — Progress Notes (Signed)
EPIC Encounter for ICM Monitoring  Patient Name: [name] is a 82 y.o. male Date: 10/21/2022 Primary Care Physican: Leonard Downing, MD Primary Cardiologist: Nahser Electrophysiologist: Caryl Comes 02/17/2022 Office Weight:  233 lbs 05/12/2022 Weight: 230 lbs 05/18/2022 Weight: 225-230 lbs 06/29/2022 Weight: 222 lbs (wt loss from dieting) 08/17/2022 Weight: 225 lbs 08/26/2022 Weight: 224 lbs   Time in AT/AF 24.0 hours                                                                        Attempted call to patient and unable to reach.  Left message to return call. Transmission reviewed.  Spoke with daughter Britt Bottom and advised of possible fluid accumulation.  She stated pt is feeling poorly and tired which he usually does when in Afib.  He saw Dr Caryl Comes on 2/12 but no adjustment to Lasix for possible fluid accumulation.     Diet:  He eats restaurant food once a week.  He eats sweets and some snacks.  Does not limit salt intake.    HeartLogic Heart Failure Index 19 suggesting possible fluid accumulation starting 2/8.    Prescribed: Furosemide 40 mg take 1 tablet by mouth daily Potassium 10 mEq ke 1 tablet by mouth daily Spironolactone 25 mg take 0.5 tablet (12.5 mg total) daily at bedtime   Labs: 05/25/2022 Creatinine 1.03, BUN 16, Potassium 4.6, Sodium 142, GFR 73 05/04/2022 Creatinine 1.20, BUN 17, Potassium 4.4, Sodium  05/01/2022 Creatinine 1.36, BUN 19, Potassium 4.2, Sodium 138, GFR 52 03/03/2022 Creatinine 1.15, BUN 16, Potassium 4.4, Sodium 140, GFR 64 10/22/2021 Creatinine 1.15, BUN 15, Potassium 4.6, Sodium 139 10/01/2021 Creatinine 1.20, BUN 20, Potassium 4.2, Sodium 136, GFR >60  A complete set of results can be found in Results Review.   Recommendations:  Message sent to Dr Caryl Comes for recommendation on Lasix adjustment.     Follow-up plan: ICM clinic phone appointment on 10/27/2022 to recheck fluid levels.   91 day device clinic remote transmission 11/12/2022.      EP/Cardiology Office Visits:  Recall 03/26/2023 with Dr Caryl Comes.    Copy of ICM check sent to Dr. Caryl Comes.  3 Month HeartLogicT Heart Failure Index:    8 Day Data Trend:          Rosalene Billings, RN 10/21/2022 11:57 AM

## 2022-10-21 NOTE — Telephone Encounter (Signed)
Remote ICM transmission received.  Attempted call to patient regarding ICM remote transmission and left message to return call   

## 2022-10-27 ENCOUNTER — Ambulatory Visit: Payer: Medicare PPO

## 2022-10-27 DIAGNOSIS — I5022 Chronic systolic (congestive) heart failure: Secondary | ICD-10-CM

## 2022-10-27 DIAGNOSIS — Z95810 Presence of automatic (implantable) cardiac defibrillator: Secondary | ICD-10-CM

## 2022-10-28 NOTE — Progress Notes (Signed)
EPIC Encounter for ICM Monitoring  Patient Name: [name] is a 82 y.o. male Date: 10/28/2022 Primary Care Physican: Leonard Downing, MD Primary Cardiologist: Nahser Electrophysiologist: Caryl Comes 02/17/2022 Office Weight:  233 lbs 05/12/2022 Weight: 230 lbs 05/18/2022 Weight: 225-230 lbs 06/29/2022 Weight: 222 lbs (wt loss from dieting) 08/17/2022 Weight: 225 lbs 08/26/2022 Weight: 224 lbs   Time in AT/AF 24.0 hours                                                                        Spoke with patient and heart failure questions reviewed.  Transmission results reviewed.  Pt asymptomatic for fluid accumulation.  Pt currently has a cold.   Diet:  He eats restaurant food once a week.  He eats sweets and some snacks.  Does not limit salt intake.    HeartLogic Heart Failure Index 15 suggesting fluid levels returning to normal after taking 80 mg Furosemide x 3 days.    Prescribed: Furosemide 40 mg take 1 tablet by mouth daily Potassium 10 mEq ke 1 tablet by mouth daily Spironolactone 25 mg take 0.5 tablet (12.5 mg total) daily at bedtime   Labs: 05/25/2022 Creatinine 1.03, BUN 16, Potassium 4.6, Sodium 142, GFR 73 05/04/2022 Creatinine 1.20, BUN 17, Potassium 4.4, Sodium  05/01/2022 Creatinine 1.36, BUN 19, Potassium 4.2, Sodium 138, GFR 52 03/03/2022 Creatinine 1.15, BUN 16, Potassium 4.4, Sodium 140, GFR 64 10/22/2021 Creatinine 1.15, BUN 15, Potassium 4.6, Sodium 139 10/01/2021 Creatinine 1.20, BUN 20, Potassium 4.2, Sodium 136, GFR >60  A complete set of results can be found in Results Review.   Recommendations:  No changes and encouraged to call if experiencing any fluid symptoms.   Follow-up plan: ICM clinic phone appointment on 11/09/2022.   91 day device clinic remote transmission 11/12/2022.     EP/Cardiology Office Visits:  Recall 03/26/2023 with Dr Caryl Comes.    Copy of ICM check sent to Dr. Caryl Comes.  3 Month HeartLogicT Heart Failure Index:     8 Day Data Trend:           Rosalene Billings, RN 10/28/2022 10:37 AM

## 2022-10-30 ENCOUNTER — Telehealth: Payer: Self-pay

## 2022-10-30 NOTE — Telephone Encounter (Signed)
Received call from daughter Britt Bottom, per DPR.  She asked why Amiodarone was stopped by Dr Caryl Comes.  Advised Amiodarone is used to great arrhythmias such as Afib and helps heart to stay in rhythm.  Per Dr Aquilla Hacker most recent OV note, stopped Amiodraone since remains in afib and is not working to keep the heart in rhythm.  She appreciated the information and was not able to come with patient to his last appointment and did not know he Amiodarone was stopped.

## 2022-11-09 ENCOUNTER — Ambulatory Visit: Payer: Medicare PPO | Attending: Internal Medicine

## 2022-11-09 DIAGNOSIS — I5022 Chronic systolic (congestive) heart failure: Secondary | ICD-10-CM

## 2022-11-09 DIAGNOSIS — Z95810 Presence of automatic (implantable) cardiac defibrillator: Secondary | ICD-10-CM

## 2022-11-11 LAB — CUP PACEART REMOTE DEVICE CHECK
Battery Remaining Longevity: 66 mo
Battery Remaining Percentage: 78 %
Brady Statistic RA Percent Paced: 0 %
Brady Statistic RV Percent Paced: 72 %
Date Time Interrogation Session: 20240306075900
HighPow Impedance: 49 Ohm
Implantable Lead Connection Status: 753985
Implantable Lead Connection Status: 753985
Implantable Lead Connection Status: 753985
Implantable Lead Implant Date: 20040315
Implantable Lead Implant Date: 20100611
Implantable Lead Implant Date: 20100611
Implantable Lead Location: 753858
Implantable Lead Location: 753859
Implantable Lead Location: 753860
Implantable Lead Model: 4196
Implantable Lead Model: 5076
Implantable Lead Model: 6947
Implantable Pulse Generator Implant Date: 20230308
Lead Channel Impedance Value: 453 Ohm
Lead Channel Impedance Value: 462 Ohm
Lead Channel Impedance Value: 464 Ohm
Lead Channel Pacing Threshold Amplitude: 0.8 V
Lead Channel Pacing Threshold Amplitude: 3.1 V
Lead Channel Pacing Threshold Pulse Width: 0.4 ms
Lead Channel Pacing Threshold Pulse Width: 1.5 ms
Lead Channel Setting Pacing Amplitude: 2 V
Lead Channel Setting Pacing Amplitude: 2.5 V
Lead Channel Setting Pacing Amplitude: 3 V
Lead Channel Setting Pacing Pulse Width: 0.4 ms
Lead Channel Setting Pacing Pulse Width: 1.5 ms
Lead Channel Setting Sensing Sensitivity: 0.5 mV
Lead Channel Setting Sensing Sensitivity: 1 mV
Pulse Gen Serial Number: 152344

## 2022-11-12 ENCOUNTER — Ambulatory Visit: Payer: Medicare PPO

## 2022-11-12 DIAGNOSIS — I255 Ischemic cardiomyopathy: Secondary | ICD-10-CM | POA: Diagnosis not present

## 2022-11-12 NOTE — Progress Notes (Signed)
EPIC Encounter for ICM Monitoring  Patient Name: [name] is a 82 y.o. male Date: 11/12/2022 Primary Care Physican: Leonard Downing, MD Primary Cardiologist: Nahser Electrophysiologist: Caryl Comes 02/17/2022 Office Weight:  233 lbs 05/12/2022 Weight: 230 lbs 05/18/2022 Weight: 225-230 lbs 06/29/2022 Weight: 222 lbs (wt loss from dieting) 08/17/2022 Weight: 225 lbs 08/26/2022 Weight: 224 lbs   Time in AT/AF 24.0 hours                                                                        Spoke with patient and heart failure questions reviewed.  Transmission results reviewed.  Pt asymptomatic for fluid accumulation but does feel weak.  He said his home therapist reported he has low BP.  Pt asking if some of his meds can be adjusted so his BP is not so low.    Diet:  He eats restaurant food once a week.  He eats sweets and some snacks.  Does not limit salt intake.    HeartLogic Heart Failure Index 10 suggesting normal fluid levels.    Prescribed: Furosemide 40 mg take 1 tablet by mouth daily Potassium 10 mEq ke 1 tablet by mouth daily Spironolactone 25 mg take 0.5 tablet (12.5 mg total) daily at bedtime   Labs: 05/25/2022 Creatinine 1.03, BUN 16, Potassium 4.6, Sodium 142, GFR 73 05/04/2022 Creatinine 1.20, BUN 17, Potassium 4.4, Sodium  05/01/2022 Creatinine 1.36, BUN 19, Potassium 4.2, Sodium 138, GFR 52 03/03/2022 Creatinine 1.15, BUN 16, Potassium 4.4, Sodium 140, GFR 64 10/22/2021 Creatinine 1.15, BUN 15, Potassium 4.6, Sodium 139 10/01/2021 Creatinine 1.20, BUN 20, Potassium 4.2, Sodium 136, GFR >60  A complete set of results can be found in Results Review.   Recommendations:  Advised to call Dr Lanny Hurst office to discuss low BP and he may need an appt to be evaluated.  He will call the office today.      Follow-up plan: ICM clinic phone appointment on 12/14/2022.   91 day device clinic remote transmission 02/11/2023.     EP/Cardiology Office Visits:  Recall 03/26/2023 with Dr  Caryl Comes.    Copy of ICM check sent to Dr. Caryl Comes.  3 Month HeartLogicT Heart Failure Index:    8 Day Data Trend:          Rosalene Billings, RN 11/12/2022 8:04 AM

## 2022-11-27 ENCOUNTER — Telehealth: Payer: Self-pay | Admitting: Internal Medicine

## 2022-11-27 NOTE — Telephone Encounter (Signed)
Spoke with daughter to advise that the prescription for amiodarone has been discontinued at [facility].  No further concerns at this time.

## 2022-11-27 NOTE — Telephone Encounter (Signed)
Pt states [facility] keeps sending him refills for Amiodarone but it has been discontinued a few weeks ago. He is asking if someone can call them and let them know to stop sending it.

## 2022-12-08 ENCOUNTER — Telehealth: Payer: Self-pay | Admitting: Cardiovascular Disease

## 2022-12-08 NOTE — Telephone Encounter (Signed)
Spoke to the patient, he stated of feeling weak, fatigue, feet and legs are little swollen. However, this is an on going issue.Physical therapy  monitors pt blood pressure readings every other day. Spoke with MD he advised the patient to hold his carvedilol for a couple of days to see if his symptoms improve. Patient voiced understanding.    Blood pressure readings 4/2 ( this AM) 100/60 4/1 110/70 3/27 94/58 3/25 110/62  Weights 4/2 218 lb 4/1 218 lb 3/31 218 lb 3/30 217 lb 3/29 217 lb

## 2022-12-08 NOTE — Telephone Encounter (Signed)
Pt c/o BP issue: STAT if pt c/o blurred vision, one-sided weakness or slurred speech  1. What are your last 5 BP readings? 98/60, 114/62, 94/58, 102/62, 100/58  2. Are you having any other symptoms (ex. Dizziness, headache, blurred vision, passed out)? Weak and lightheaded at times  3. What is your BP issue? Blood pressure running low

## 2022-12-14 ENCOUNTER — Ambulatory Visit: Payer: Medicare PPO | Attending: Internal Medicine

## 2022-12-14 DIAGNOSIS — I5022 Chronic systolic (congestive) heart failure: Secondary | ICD-10-CM | POA: Diagnosis not present

## 2022-12-14 DIAGNOSIS — Z95810 Presence of automatic (implantable) cardiac defibrillator: Secondary | ICD-10-CM | POA: Diagnosis not present

## 2022-12-15 NOTE — Progress Notes (Signed)
EPIC Encounter for ICM Monitoring  Patient Name: [name] is a 82 y.o. male Date: 12/15/2022 Primary Care Physican: Kaleen Mask, MD rimary Cardiologist: Nahser Electrophysiologist: Graciela Husbands 02/17/2022 Office Weight:  233 lbs 05/12/2022 Weight: 230 lbs 05/18/2022 Weight: 225-230 lbs 06/29/2022 Weight: 222 lbs (wt loss from dieting) 08/17/2022 Weight: 225 lbs 08/26/2022 Weight: 224 lbs 12/15/2022 Weight: 219 lbs   Time in AT/AF 24.0 hours                                                                        Spoke with patient and heart failure questions reviewed.  Transmission results reviewed.  Pt reports ankle swelling but denies any breathing difficulties.   Diet:  He eats restaurant food once a week.  He eats sweets and some snacks.  Does not limit salt intake.    HeartLogic Heart Failure Index 17 and decreased thoracic impedance suggesting possible fluid accumulation starting 3/22.    Prescribed: Furosemide 40 mg take 1 tablet by mouth daily Potassium 10 mEq ke 1 tablet by mouth daily Spironolactone 25 mg take 0.5 tablet (12.5 mg total) daily at bedtime   Labs: 09/05/2022 Creatinine 1.17, GFR 62 05/25/2022 Creatinine 1.03, BUN 16, Potassium 4.6, Sodium 142, GFR 73 05/04/2022 Creatinine 1.20, BUN 17, Potassium 4.4, Sodium  05/01/2022 Creatinine 1.36, BUN 19, Potassium 4.2, Sodium 138, GFR 52 03/03/2022 Creatinine 1.15, BUN 16, Potassium 4.4, Sodium 140, GFR 64 10/22/2021 Creatinine 1.15, BUN 15, Potassium 4.6, Sodium 139 10/01/2021 Creatinine 1.20, BUN 20, Potassium 4.2, Sodium 136, GFR >60  A complete set of results can be found in Results Review.   Recommendations:  Advised will send copy to [name], [place] for review at 12/17/2022 OV and any recommendations will be provided at that time if needed.        Follow-up plan: ICM clinic phone appointment on 12/14/2022.   91 day device clinic remote transmission 02/11/2023.     EP/Cardiology Office Visits:  Recall 03/26/2023  with Dr Graciela Husbands.    Copy of ICM check sent to Dr. Graciela Husbands.  3 Month HeartLogicT Heart Failure Index:    8 Day Data Trend:          Karie Soda, RN 12/15/2022 10:22 AM

## 2022-12-15 NOTE — Progress Notes (Signed)
Remote ICD transmission.   

## 2022-12-17 ENCOUNTER — Ambulatory Visit: Payer: Medicare PPO | Attending: General Practice | Admitting: Physician Assistant

## 2022-12-17 ENCOUNTER — Encounter: Payer: Self-pay | Admitting: Physician Assistant

## 2022-12-17 VITALS — BP 111/67 | HR 71 | Ht 71.0 in | Wt 221.4 lb

## 2022-12-17 DIAGNOSIS — I5022 Chronic systolic (congestive) heart failure: Secondary | ICD-10-CM

## 2022-12-17 DIAGNOSIS — E032 Hypothyroidism due to medicaments and other exogenous substances: Secondary | ICD-10-CM

## 2022-12-17 DIAGNOSIS — I255 Ischemic cardiomyopathy: Secondary | ICD-10-CM

## 2022-12-17 DIAGNOSIS — Z79899 Other long term (current) drug therapy: Secondary | ICD-10-CM

## 2022-12-17 DIAGNOSIS — I4819 Other persistent atrial fibrillation: Secondary | ICD-10-CM

## 2022-12-17 DIAGNOSIS — I10 Essential (primary) hypertension: Secondary | ICD-10-CM

## 2022-12-17 NOTE — Progress Notes (Signed)
Office Visit    Patient Name: [name] Date of Encounter: 12/17/2022  PCP:  Kaleen Mask, MD   [facility]  Cardiologist:  Kristeen Miss, MD  Advanced Practice Provider:  No care team member to display Electrophysiologist:  Sherryl Manges, MD   HPI    [name] is a 82 y.o. male with a past medical history of AICD, CHF, chronic systolic heart failure, CAD, hyperlipidemia, ischemic cardiomyopathy, paroxysmal atrial fibrillation presents today for follow-up appointment.  He has had persistent atrial fibrillation on apixaban and amiodarone.  No recent bleeding.  Reverted to atrial fibrillation but had not noted any significant improvement following today's 9/23.  Mobilizing past couple months with dyspnea at baseline but also suffers with GI issues and orthopedic hip and back pain.  Spends more time inside the house and outside since his wife passed away.  His device was reprogrammed to VVIR to try and augment ventricular pacing when he was seen by Dr. Graciela Husbands in February.  He was last seen by Dr. Elease Hashimoto January 2024 and at that time does not have any chest pain.  He was losing some weight.  He tried spironolactone for his CHF symptoms and it caused hypotension.  He also tried Jardiance in the past. He has diabetes mellitus and still was started on metformin.  Was taking ASA 325 for pain but encouraged him to take Tylenol instead. He was encouraged to follow-up in 6 months.  Today, he tells me that Feb he fell out of the bed and spent the night on the floor. He stopped his coreg for a few days. He is now back on it. He was encouraged to continue lasix and potassium. He has had some swelling in his legs. He is requesting his thyroid and sugars be checked today. His BP is low normal today in the office and his log showed SBP from 100-130.   Reports no shortness of breath nor dyspnea on exertion. Reports no chest pain, pressure, or tightness. No orthopnea,  PND. Reports no palpitations.   Past Medical History    Past Medical History:  Diagnosis Date   AICD (automatic cardioverter/defibrillator) present    Arthritis    knees, back    BPH (benign prostatic hypertrophy)    CHF (congestive heart failure)    Chronic kidney disease    BPH   Chronic systolic heart failure    a. s/p MDT single chamber ICD 2004 as part of MASTER study b. upgrade to CRTD 2010; CRTD gen change 2016   Coronary artery disease    a. s/p anterior MI and CYPHER stent to LAD 2005   Dyslipidemia    Dysrhythmia    a-fib   Gout    Ischemic cardiomyopathy    Paroxysmal atrial fibrillation    Ventricular tachycardia    Past Surgical History:  Procedure Laterality Date   APPENDECTOMY  1970   BI-VENTRICULAR IMPLANTABLE CARDIOVERTER DEFIBRILLATOR UPGRADE N/A 11/07/2014   a. MDT single chamber ICD implanted 2004 as part of MASTER study; upgrade to CRTD 2010; gen change 2016   BIV ICD GENERATOR CHANGEOUT N/A 11/12/2021   Procedure: BIV ICD GENERATOR CHANGEOUT;  Surgeon: Duke Salvia, MD;  Location: [facility] INVASIVE CV LAB;  Service: Cardiovascular;  Laterality: N/A;   CARDIAC CATHETERIZATION  09/08/2003   x3 total of 5 stents   CARDIOVERSION N/A 07/04/2019   Procedure: CARDIOVERSION;  Surgeon: Wendall Stade, MD;  Location: [facility] ENDOSCOPY;  Service: Cardiovascular;  Laterality: N/A;   CARDIOVERSION N/A 08/17/2019   Procedure: CARDIOVERSION;  Surgeon: Elease Hashimoto Deloris Ping, MD;  Location: [facility] ENDOSCOPY;  Service: Cardiovascular;  Laterality: N/A;   CARDIOVERSION N/A 05/04/2022   Procedure: CARDIOVERSION;  Surgeon: Quintella Reichert, MD;  Location: [facility] ENDOSCOPY;  Service: Cardiovascular;  Laterality: N/A;   PROSTATECTOMY  11/06/2011   Procedure: PROSTATECTOMY SUPRAPUBIC;  Surgeon: Kathi Ludwig, MD;  Location: [facility] ORS;  Service: Urology;  Laterality: N/A;  Open Suprapubic Prostatectomy   TEE WITHOUT CARDIOVERSION N/A 05/16/2019   Procedure: TRANSESOPHAGEAL ECHOCARDIOGRAM (TEE);   Surgeon: Sande Rives, MD;  Location: [facility] ENDOSCOPY;  Service: Cardiology;  Laterality: N/A;   TOTAL KNEE ARTHROPLASTY Left 10/06/2021   Procedure: TOTAL KNEE ARTHROPLASTY;  Surgeon: Jodi Geralds, MD;  Location: [facility] ORS;  Service: Orthopedics;  Laterality: Left;    Allergies  Allergies  Allergen Reactions   Lipitor [Atorvastatin] Other (See Comments)    MUSCLE ACHE    Jardiance [Empagliflozin] Other (See Comments)    Intolerance    EKGs/Labs/Other Studies Reviewed:   The following studies were reviewed today:  NM PET/CT 07/2022   Findings are consistent with prior myocardial infarction. 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   LV perfusion is abnormal. There is evidence of infarction. Defect 1: There is a large defect with severe reduction in uptake present in the apical to basal anterior, anteroseptal and apex location(s) that is fixed. There is abnormal wall motion in the defect area. Consistent with infarction. Defect 2: There is a small defect with moderate reduction in uptake present in the apical to mid inferoseptal location(s) that is fixed. There is abnormal wall motion in the defect area. Consistent with infarction.   Rest left ventricular function is abnormal. Rest global function is severely reduced. There were multiple regional abnormalities. Rest EF: 15 %. Stress left ventricular function is abnormal. Stress global function is severely reduced. There were multiple regional abnormalities. Stress EF: 10 %. End systolic cavity size is severely enlarged. Recommend TTE for further evaluation of LVEF.   Myocardial blood flow was computed to be 0.36ml/g/min at rest and 0.38ml/g/min at stress. Global myocardial blood flow reserve was 1.83 and was abnormal.   Coronary calcium was present on the attenuation correction CT images. Severe  coronary calcifications were present. Coronary calcifications were present in the left anterior descending artery, left circumflex artery and right coronary artery distribution(s).     EKG:  EKG is not ordered today.  Recent Labs: 03/31/2022: TSH 1.180 05/01/2022: Platelets 231 05/04/2022: Hemoglobin 16.0 05/25/2022: ALT 26; BUN 16; Creatinine, Ser 1.03; Potassium 4.6; Sodium 142  Recent Lipid Panel    Component Value Date/Time   CHOL 133 05/25/2022 1019   TRIG 164 (H) 05/25/2022 1019   HDL 33 (L) 05/25/2022 1019   CHOLHDL 4.0 05/25/2022 1019   CHOLHDL 4.3 06/24/2016 1201   VLDL 31 (H) 06/24/2016 1201   LDLCALC 72 05/25/2022 1019   LDLDIRECT 66.5 07/21/2011 0926     Home Medications   Current Meds  Medication Sig   allopurinol (ZYLOPRIM) 100 MG tablet Take 100 mg by mouth at bedtime.   apixaban (ELIQUIS) 5 MG TABS tablet TAKE 1 TABLET TWICE DAILY   carvedilol (COREG) 3.125 MG tablet TAKE 1 TABLET TWICE DAILY   EPINEPHrine 0.3 mg/0.3 mL IJ SOAJ injection Inject 0.3 mg into the muscle as needed.  furosemide (LASIX) 40 MG tablet Take 1 tablet (40 mg total) by mouth daily.   latanoprost (XALATAN) 0.005 % ophthalmic solution Place 1 drop into both eyes at bedtime.    levothyroxine (SYNTHROID) 125 MCG tablet Take 125 mcg by mouth daily before breakfast.   Lysine 1000 MG TABS Take 1 tablet by mouth daily in the afternoon.   metFORMIN (GLUCOPHAGE) 500 MG tablet Take 500 mg by mouth daily.   nitroGLYCERIN (NITROSTAT) 0.4 MG SL tablet Place 1 tablet (0.4 mg total) under the tongue every 5 (five) minutes as needed for chest pain.   Polyethyl Glycol-Propyl Glycol 0.4-0.3 % SOLN Place 1 drop into both eyes daily.   potassium chloride (KLOR-CON) 10 MEQ tablet Take 1 tablet (10 mEq total) by mouth daily.   rosuvastatin (CRESTOR) 10 MG tablet Take 0.5 tablets (5 mg total) by mouth daily.   tiZANidine (ZANAFLEX) 2 MG tablet Take 2 mg by mouth at bedtime.   TURMERIC PO Take 1 tablet by mouth  daily in the afternoon.     Review of Systems      All other systems reviewed and are otherwise negative except as noted above.  Physical Exam    VS:  BP 111/67   Pulse 71   Ht 5\' 11"  (1.803 m)   Wt 221 lb 6.4 oz (100.4 kg)   SpO2 93%   BMI 30.88 kg/m  , BMI Body mass index is 30.88 kg/m.  Wt Readings from Last 3 Encounters:  12/17/22 221 lb 6.4 oz (100.4 kg)  10/19/22 219 lb (99.3 kg)  09/21/22 222 lb 12.8 oz (101.1 kg)     GEN: Well nourished, well developed, in no acute distress. HEENT: normal. Neck: Supple, no JVD, carotid bruits, or masses. Cardiac: RRR, no murmurs, rubs, or gallops. No clubbing, cyanosis, trace edema.  Radials/PT 2+ and equal bilaterally.  Respiratory:  Respirations regular and unlabored, clear to auscultation bilaterally. GI: Soft, nontender, nondistended. MS: No deformity or atrophy. Skin: Warm and dry, no rash. Neuro:  Strength and sensation are intact. Psych: Normal affect.  Assessment & Plan    Coronary artery disease status post anterior wall MI -no angina -Continue current medications which include Eliquis 5 mg twice a day, carvedilol 3.125 mg twice a day, Lasix 40 mg daily, nitro as needed, Crestor 10 mg daily  CHF -Euvolemic on exam but does endorse trace lower extremity edema -Would hesitate increasing Lasix at the current moment with issues surrounding hypotension (blood pressure stable today 111/67) -ordered BNP today  Hypertension now occasional hypotension -Blood pressure log provided today and for the most part SBP 100-130 -No medication changes at this time -Continue carvedilol 3.125 mg twice a day  Biventricular PPM/AICD -follows with Dr. Graciela HusbandsKlein  Hyperlipidemia -LDL 72, at goal.  Triglycerides 164, slightly above goal -will be due to repeat lipid panel in the fall  Afib -stable, continue Eliquis and coreg         Disposition: Follow up 6 months with Kristeen MissPhilip Nahser, MD or APP.  Signed, Sharlene Doryessa N Durrel Mcnee,  PA-C 12/17/2022, 3:00 PM [facility]

## 2022-12-17 NOTE — Patient Instructions (Signed)
Medication Instructions:  Your physician recommends that you continue on your current medications as directed. Please refer to the Current Medication list given to you today.  *If you need a refill on your cardiac medications before your next appointment, please call your pharmacy*  Lab Work: BNP, TSH, A1C--TODAY If you have labs (blood work) drawn today and your tests are completely normal, you will receive your results only by: MyChart Message (if you have MyChart) OR A paper copy in the mail If you have any lab test that is abnormal or we need to change your treatment, we will call you to review the results.   Follow-Up: At [facility], you and your health needs are our priority.  As part of our continuing mission to provide you with exceptional heart care, we have created designated Provider Care Teams.  These Care Teams include your primary Cardiologist (physician) and Advanced Practice Providers (APPs -  Physician Assistants and Nurse Practitioners) who all work together to provide you with the care you need, when you need it.  Your next appointment:   6 month(s)  Provider:   Kristeen Miss, MD    Other Instructions Weigh every morning after using the restroom, before breakfast and let us know if you have a weight gain of 2 or more lbs overnight or 5 or more lbs in a week  Low-Sodium Eating Plan Sodium, which is an element that makes up salt, helps you maintain a healthy balance of fluids in your body. Too much sodium can increase your blood pressure and cause fluid and waste to be held in your body. Your health care provider or dietitian may recommend following this plan if you have high blood pressure (hypertension), kidney disease, liver disease, or heart failure. Eating less sodium can help lower your blood pressure, reduce swelling, and protect your heart, liver, and kidneys. What are tips for following this plan? Reading food labels The Nutrition Facts label lists  the amount of sodium in one serving of the food. If you eat more than one serving, you must multiply the listed amount of sodium by the number of servings. Choose foods with less than 140 mg of sodium per serving. Avoid foods with 300 mg of sodium or more per serving. Shopping  Look for lower-sodium products, often labeled as "low-sodium" or "no salt added." Always check the sodium content, even if foods are labeled as "unsalted" or "no salt added." Buy fresh foods. Avoid canned foods and pre-made or frozen meals. Avoid canned, cured, or processed meats. Buy breads that have less than 80 mg of sodium per slice. Cooking  Eat more home-cooked food and less restaurant, buffet, and fast food. Avoid adding salt when cooking. Use salt-free seasonings or herbs instead of table salt or sea salt. Check with your health care provider or pharmacist before using salt substitutes. Cook with plant-based oils, such as canola, sunflower, or olive oil. Meal planning When eating at a restaurant, ask that your food be prepared with less salt or no salt, if possible. Avoid dishes labeled as brined, pickled, cured, smoked, or made with soy sauce, miso, or teriyaki sauce. Avoid foods that contain MSG (monosodium glutamate). MSG is sometimes added to [place] food, bouillon, and some canned foods. Make meals that can be grilled, baked, poached, roasted, or steamed. These are generally made with less sodium. General information Most people on this plan should limit their sodium intake to 1,500-2,000 mg (milligrams) of sodium each day. What foods should I eat?  Fruits Fresh, frozen, or canned fruit. Fruit juice. Vegetables Fresh or frozen vegetables. "No salt added" canned vegetables. "No salt added" tomato sauce and paste. Low-sodium or reduced-sodium tomato and vegetable juice. Grains Low-sodium cereals, including oats, puffed wheat and rice, and shredded wheat. Low-sodium crackers. Unsalted rice. Unsalted pasta.  Low-sodium bread. Whole-grain breads and whole-grain pasta. Meats and other proteins Fresh or frozen (no salt added) meat, poultry, seafood, and fish. Low-sodium canned tuna and salmon. Unsalted nuts. Dried peas, beans, and lentils without added salt. Unsalted canned beans. Eggs. Unsalted nut butters. Dairy Milk. Soy milk. Cheese that is naturally low in sodium, such as ricotta cheese, fresh mozzarella, or Swiss cheese. Low-sodium or reduced-sodium cheese. Cream cheese. Yogurt. Seasonings and condiments Fresh and dried herbs and spices. Salt-free seasonings. Low-sodium mustard and ketchup. Sodium-free salad dressing. Sodium-free light mayonnaise. Fresh or refrigerated horseradish. Lemon juice. Vinegar. Other foods Homemade, reduced-sodium, or low-sodium soups. Unsalted popcorn and pretzels. Low-salt or salt-free chips. The items listed above may not be a complete list of foods and beverages you can eat. Contact a dietitian for more information. What foods should I avoid? Vegetables Sauerkraut, pickled vegetables, and relishes. Olives. [place] fries. Onion rings. Regular canned vegetables (not low-sodium or reduced-sodium). Regular canned tomato sauce and paste (not low-sodium or reduced-sodium). Regular tomato and vegetable juice (not low-sodium or reduced-sodium). Frozen vegetables in sauces. Grains Instant hot cereals. Bread stuffing, pancake, and biscuit mixes. Croutons. Seasoned rice or pasta mixes. Noodle soup cups. Boxed or frozen macaroni and cheese. Regular salted crackers. Self-rising flour. Meats and other proteins Meat or fish that is salted, canned, smoked, spiced, or pickled. Precooked or cured meat, such as sausages or meat loaves. Tomasa Blase. Ham. Pepperoni. Hot dogs. Corned beef. Chipped beef. Salt pork. Jerky. Pickled herring. Anchovies and sardines. Regular canned tuna. Salted nuts. Dairy Processed cheese and cheese spreads. Hard cheeses. Cheese curds. Blue cheese. Feta cheese. String  cheese. Regular cottage cheese. Buttermilk. Canned milk. Fats and oils Salted butter. Regular margarine. Ghee. Bacon fat. Seasonings and condiments Onion salt, garlic salt, seasoned salt, table salt, and sea salt. Canned and packaged gravies. Worcestershire sauce. Tartar sauce. Barbecue sauce. Teriyaki sauce. Soy sauce, including reduced-sodium. Steak sauce. Fish sauce. Oyster sauce. Cocktail sauce. Horseradish that you find on the shelf. Regular ketchup and mustard. Meat flavorings and tenderizers. Bouillon cubes. Hot sauce. Pre-made or packaged marinades. Pre-made or packaged taco seasonings. Relishes. Regular salad dressings. Salsa. Other foods Salted popcorn and pretzels. Corn chips and puffs. Potato and tortilla chips. Canned or dried soups. Pizza. Frozen entrees and pot pies. The items listed above may not be a complete list of foods and beverages you should avoid. Contact a dietitian for more information. Summary Eating less sodium can help lower your blood pressure, reduce swelling, and protect your heart, liver, and kidneys. Most people on this plan should limit their sodium intake to 1,500-2,000 mg (milligrams) of sodium each day. Canned, boxed, and frozen foods are high in sodium. Restaurant foods, fast foods, and pizza are also very high in sodium. You also get sodium by adding salt to food. Try to cook at home, eat more fresh fruits and vegetables, and eat less fast food and canned, processed, or prepared foods. This information is not intended to replace advice given to you by your health care provider. Make sure you discuss any questions you have with your health care provider. Document Revised: 07/31/2019 Document Reviewed: 07/26/2019 Elsevier Patient Education  2023 Elsevier Inc.   Heart-Healthy Eating Plan Many factors  influence your heart health, including eating and exercise habits. Heart health is also called coronary health. Coronary risk increases with abnormal blood fat  (lipid) levels. A heart-healthy eating plan includes limiting unhealthy fats, increasing healthy fats, limiting salt (sodium) intake, and making other diet and lifestyle changes. What is my plan? Your health care provider may recommend that: You limit your fat intake to _________% or less of your total calories each day. You limit your saturated fat intake to _________% or less of your total calories each day. You limit the amount of cholesterol in your diet to less than _________ mg per day. You limit the amount of sodium in your diet to less than _________ mg per day. What are tips for following this plan? Cooking Cook foods using methods other than frying. Baking, boiling, grilling, and broiling are all good options. Other ways to reduce fat include: Removing the skin from poultry. Removing all visible fats from meats. Steaming vegetables in water or broth. Meal planning  At meals, imagine dividing your plate into fourths: Fill one-half of your plate with vegetables and green salads. Fill one-fourth of your plate with whole grains. Fill one-fourth of your plate with lean protein foods. Eat 2-4 cups of vegetables per day. One cup of vegetables equals 1 cup (91 g) broccoli or cauliflower florets, 2 medium carrots, 1 large bell pepper, 1 large sweet potato, 1 large tomato, 1 medium white potato, 2 cups (150 g) raw leafy greens. Eat 1-2 cups of fruit per day. One cup of fruit equals 1 small apple, 1 large banana, 1 cup (237 g) mixed fruit, 1 large orange,  cup (82 g) dried fruit, 1 cup (240 mL) 100% fruit juice. Eat more foods that contain soluble fiber. Examples include apples, broccoli, carrots, beans, peas, and barley. Aim to get 25-30 g of fiber per day. Increase your consumption of legumes, nuts, and seeds to 4-5 servings per week. One serving of dried beans or legumes equals  cup (90 g) cooked, 1 serving of nuts is  oz (12 almonds, 24 pistachios, or 7 walnut halves), and 1 serving of  seeds equals  oz (8 g). Fats Choose healthy fats more often. Choose monounsaturated and polyunsaturated fats, such as olive and canola oils, avocado oil, flaxseeds, walnuts, almonds, and seeds. Eat more omega-3 fats. Choose salmon, mackerel, sardines, tuna, flaxseed oil, and ground flaxseeds. Aim to eat fish at least 2 times each week. Check food labels carefully to identify foods with trans fats or high amounts of saturated fat. Limit saturated fats. These are found in animal products, such as meats, butter, and cream. Plant sources of saturated fats include palm oil, palm kernel oil, and coconut oil. Avoid foods with partially hydrogenated oils in them. These contain trans fats. Examples are stick margarine, some tub margarines, cookies, crackers, and other baked goods. Avoid fried foods. General information Eat more home-cooked food and less restaurant, buffet, and fast food. Limit or avoid alcohol. Limit foods that are high in added sugar and simple starches such as foods made using white refined flour (white breads, pastries, sweets). Lose weight if you are overweight. Losing just 5-10% of your body weight can help your overall health and prevent diseases such as diabetes and heart disease. Monitor your sodium intake, especially if you have high blood pressure. Talk with your health care provider about your sodium intake. Try to incorporate more vegetarian meals weekly. What foods should I eat? Fruits All fresh, canned (in natural juice), or frozen fruits.  Vegetables Fresh or frozen vegetables (raw, steamed, roasted, or grilled). Green salads. Grains Most grains. Choose whole wheat and whole grains most of the time. Rice and pasta, including brown rice and pastas made with whole wheat. Meats and other proteins Lean, well-trimmed beef, veal, pork, and lamb. Chicken and [place] without skin. All fish and shellfish. Wild duck, rabbit, pheasant, and venison. Egg whites or low-cholesterol egg  substitutes. Dried beans, peas, lentils, and tofu. Seeds and most nuts. Dairy Low-fat or nonfat cheeses, including ricotta and mozzarella. Skim or 1% milk (liquid, powdered, or evaporated). Buttermilk made with low-fat milk. Nonfat or low-fat yogurt. Fats and oils Non-hydrogenated (trans-free) margarines. Vegetable oils, including soybean, sesame, sunflower, olive, avocado, peanut, safflower, corn, canola, and cottonseed. Salad dressings or mayonnaise made with a vegetable oil. Beverages Water (mineral or sparkling). Coffee and tea. Unsweetened ice tea. Diet beverages. Sweets and desserts Sherbet, gelatin, and fruit ice. Small amounts of dark chocolate. Limit all sweets and desserts. Seasonings and condiments All seasonings and condiments. The items listed above may not be a complete list of foods and beverages you can eat. Contact a dietitian for more options. What foods should I avoid? Fruits Canned fruit in heavy syrup. Fruit in cream or butter sauce. Fried fruit. Limit coconut. Vegetables Vegetables cooked in cheese, cream, or butter sauce. Fried vegetables. Grains Breads made with saturated or trans fats, oils, or whole milk. Croissants. Sweet rolls. Donuts. High-fat crackers, such as cheese crackers and chips. Meats and other proteins Fatty meats, such as hot dogs, ribs, sausage, bacon, rib-eye roast or steak. High-fat deli meats, such as salami and bologna. Caviar. Domestic duck and goose. Organ meats, such as liver. Dairy Cream, sour cream, cream cheese, and creamed cottage cheese. Whole-milk cheeses. Whole or 2% milk (liquid, evaporated, or condensed). Whole buttermilk. Cream sauce or high-fat cheese sauce. Whole-milk yogurt. Fats and oils Meat fat, or shortening. Cocoa butter, hydrogenated oils, palm oil, coconut oil, palm kernel oil. Solid fats and shortenings, including bacon fat, salt pork, lard, and butter. Nondairy cream substitutes. Salad dressings with cheese or sour  cream. Beverages Regular sodas and any drinks with added sugar. Sweets and desserts Frosting. Pudding. Cookies. Cakes. Pies. Milk chocolate or white chocolate. Buttered syrups. Full-fat ice cream or ice cream drinks. The items listed above may not be a complete list of foods and beverages to avoid. Contact a dietitian for more information. Summary Heart-healthy meal planning includes limiting unhealthy fats, increasing healthy fats, limiting salt (sodium) intake and making other diet and lifestyle changes. Lose weight if you are overweight. Losing just 5-10% of your body weight can help your overall health and prevent diseases such as diabetes and heart disease. Focus on eating a balance of foods, including fruits and vegetables, low-fat or nonfat dairy, lean protein, nuts and legumes, whole grains, and heart-healthy oils and fats. This information is not intended to replace advice given to you by your health care provider. Make sure you discuss any questions you have with your health care provider. Document Revised: 09/29/2021 Document Reviewed: 09/29/2021 Elsevier Patient Education  2023 [employer].

## 2022-12-18 LAB — HEMOGLOBIN A1C
Est. average glucose Bld gHb Est-mCnc: 151 mg/dL
Hgb A1c MFr Bld: 6.9 % — ABNORMAL HIGH (ref 4.8–5.6)

## 2022-12-18 LAB — PRO B NATRIURETIC PEPTIDE: NT-Pro BNP: 1790 pg/mL — ABNORMAL HIGH (ref 0–486)

## 2022-12-18 LAB — TSH: TSH: 1.12 u[IU]/mL (ref 0.450–4.500)

## 2022-12-21 ENCOUNTER — Ambulatory Visit: Payer: Medicare PPO | Attending: Internal Medicine

## 2022-12-21 DIAGNOSIS — I5022 Chronic systolic (congestive) heart failure: Secondary | ICD-10-CM

## 2022-12-21 DIAGNOSIS — Z95810 Presence of automatic (implantable) cardiac defibrillator: Secondary | ICD-10-CM

## 2022-12-23 NOTE — Progress Notes (Signed)
  Received: Today Sharlene Dory, PA-C  Nyaira Hodgens, Josephine Igo, RN Okay yes, please remind the patient to weigh daily and if weight increases by 2 lbs overnight or 5 lbs in a week he can take the extra lasix dose. Also, have him give our office a call in a few days to give [place] an update.  Thanks Sharlene Dory, PA-C

## 2022-12-23 NOTE — Progress Notes (Signed)
Spoke with daughter and reviewed transmission results.  She stated patient has not felt very well over the last few days.  Advised of possible fluid accumulation which report shows it is improving after taking extra Furosemide as instructed by [name].  Advised will recheck fluid levels on 4/22 to ensure fluid accumulation does not worsen.

## 2022-12-23 NOTE — Progress Notes (Signed)
Call to daughter (pt was with her) and advised of Tessa Conte's recommendation to weigh daily and take extra Lasix dose if weight increases by 2 lbs overnight or 5 lbs in a week he can take the extra lasix dose. Also, he should call her office in a few days to provide update on how he is doing.    She verbalized understanding and told patient since he was with her.

## 2022-12-23 NOTE — Progress Notes (Signed)
Attempted call to patient and unable to reach.    

## 2022-12-23 NOTE — Progress Notes (Signed)
EPIC Encounter for ICM Monitoring  Patient Name: [name] is a 82 y.o. male Date: 12/23/2022 Primary Care Physican: James Mask, MD Primary Cardiologist: [name] Electrophysiologist: [name] 02/17/2022 Office Weight:  233 lbs 05/12/2022 Weight: 230 lbs 05/18/2022 Weight: 225-230 lbs 06/29/2022 Weight: 222 lbs (wt loss from dieting) 08/17/2022 Weight: 225 lbs 08/26/2022 Weight: 224 lbs 12/15/2022 Weight: 219 lbs 12/23/2022 Weight: 217 lbs   Time in AT/AF 24.0 hours                                                                        Spoke with patient and heart failure questions reviewed.  Transmission results reviewed.  Pt reports minimal ankle swelling but denies any breathing difficulties.   Diet:  He eats restaurant food once a week.  He eats sweets and some snacks.  Does not limit salt intake.    HeartLogic Heart Failure Index decreased from 17 to 16 suggesting possible fluid accumulation but improving.   Thoracic impedance trending up suggesting improvement of fluid levels   Prescribed: Furosemide 40 mg take 1 tablet by mouth daily Potassium 10 mEq ke 1 tablet by mouth daily Spironolactone 25 mg take 0.5 tablet (12.5 mg total) daily at bedtime   Labs: 12/17/2022 BNP 1,790 09/05/2022 Creatinine 1.17, GFR 62 05/25/2022 Creatinine 1.03, BUN 16, Potassium 4.6, Sodium 142, GFR 73 05/04/2022 Creatinine 1.20, BUN 17, Potassium 4.4, Sodium  05/01/2022 Creatinine 1.36, BUN 19, Potassium 4.2, Sodium 138, GFR 52 03/03/2022 Creatinine 1.15, BUN 16, Potassium 4.4, Sodium 140, GFR 64 10/22/2021 Creatinine 1.15, BUN 15, Potassium 4.6, Sodium 139 10/01/2021 Creatinine 1.20, BUN 20, Potassium 4.2, Sodium 136, GFR >60  A complete set of results can be found in Results Review.   Recommendations:  Pt reports [name], [place] advised to increase Furosemide to 40 mg twice a day with potassium x 5 days (in response to elevated BNP) which will end today, 4/17   Follow-up plan: ICM  clinic phone appointment on 01/18/2023.   91 day device clinic remote transmission 02/11/2023.     EP/Cardiology Office Visits:  Recall 03/26/2023 with Dr [name].    Copy of ICM check sent to Dr. Graciela [name] and James Favre PA for FYI after 4/11 OV.  3 Month HeartLogicT Heart Failure Index:    8 Day Data Trend:          James Soda, RN 12/23/2022 12:36 PM

## 2022-12-28 ENCOUNTER — Ambulatory Visit: Payer: Medicare PPO | Attending: Internal Medicine

## 2022-12-28 DIAGNOSIS — I5022 Chronic systolic (congestive) heart failure: Secondary | ICD-10-CM

## 2022-12-28 DIAGNOSIS — Z95810 Presence of automatic (implantable) cardiac defibrillator: Secondary | ICD-10-CM

## 2022-12-29 NOTE — Progress Notes (Signed)
EPIC Encounter for ICM Monitoring  Patient Name: [name] is a 82 y.o. male Date: 12/29/2022 Primary Care Physican: Kaleen Mask, MD Primary Cardiologist: Nahser Electrophysiologist: Graciela Husbands 08/17/2022 Weight: 225 lbs 08/26/2022 Weight: 224 lbs 12/15/2022 Weight: 219 lbs 12/23/2022 Weight: 217 lbs   Time in AT/AF 24.0 hours                                                                        Spoke with daughter/patient and heart failure questions reviewed.  Transmission results reviewed.  Pt reports he is feeling well.   Diet:  He eats restaurant food once a week.  He eats sweets and some snacks.  Does not limit salt intake.    HeartLogic Heart Failure Index decreased from 16 to 10 suggesting fluid levels returned to normal after taking extra Furosemide x 5 days.      Prescribed: Furosemide 40 mg take 1 tablet by mouth daily Potassium 10 mEq ke 1 tablet by mouth daily Spironolactone 25 mg take 0.5 tablet (12.5 mg total) daily at bedtime   Labs: 12/17/2022 BNP 1,790 09/05/2022 Creatinine 1.17, GFR 62 05/25/2022 Creatinine 1.03, BUN 16, Potassium 4.6, Sodium 142, GFR 73 05/04/2022 Creatinine 1.20, BUN 17, Potassium 4.4, Sodium  05/01/2022 Creatinine 1.36, BUN 19, Potassium 4.2, Sodium 138, GFR 52 03/03/2022 Creatinine 1.15, BUN 16, Potassium 4.4, Sodium 140, GFR 64 10/22/2021 Creatinine 1.15, BUN 15, Potassium 4.6, Sodium 139 10/01/2021 Creatinine 1.20, BUN 20, Potassium 4.2, Sodium 136, GFR >60  A complete set of results can be found in Results Review.   Recommendations:  No changes and encouraged to call if experiencing any fluid symptoms.   Follow-up plan: ICM clinic phone appointment on 01/18/2023.   91 day device clinic remote transmission 02/11/2023.     EP/Cardiology Office Visits:  Recall 03/26/2023 with Dr Graciela Husbands.    Copy of ICM check sent to Dr. Graciela Husbands.  3 Month HeartLogicT Heart Failure Index:    8 Day Data Trend:          Karie Soda,  RN 12/29/2022 12:59 PM

## 2023-01-05 ENCOUNTER — Ambulatory Visit: Payer: Medicare PPO | Admitting: General Practice

## 2023-01-18 ENCOUNTER — Ambulatory Visit: Payer: Medicare PPO | Attending: Internal Medicine

## 2023-01-18 DIAGNOSIS — Z95810 Presence of automatic (implantable) cardiac defibrillator: Secondary | ICD-10-CM | POA: Diagnosis not present

## 2023-01-18 DIAGNOSIS — I5022 Chronic systolic (congestive) heart failure: Secondary | ICD-10-CM

## 2023-01-22 NOTE — Progress Notes (Signed)
EPIC Encounter for ICM Monitoring  Patient Name: [name] is a 82 y.o. male Date: 01/22/2023 Primary Care Physican: Kaleen Mask, MD Primary Cardiologist: Nahser Electrophysiologist: Graciela Husbands 08/17/2022 Weight: 225 lbs 08/26/2022 Weight: 224 lbs 12/15/2022 Weight: 219 lbs 12/23/2022 Weight: 217 lbs 01/22/2023 Weight: 217-220   Time in AT/AF 24.0 hours                                                                        Spoke with patient and heart failure questions reviewed.  Transmission results reviewed.  Pt reports he is feeling well.   Diet:  He eats restaurant food once a week.  He eats sweets and some snacks.  Does not limit salt intake.    HeartLogic Heart Failure Index 8 suggesting normal fluid levels.      Prescribed: Furosemide 40 mg take 1 tablet by mouth daily Potassium 10 mEq ke 1 tablet by mouth daily Spironolactone 25 mg take 0.5 tablet (12.5 mg total) daily at bedtime   Labs: 12/17/2022 BNP 1,790 09/05/2022 Creatinine 1.17, GFR 62 05/25/2022 Creatinine 1.03, BUN 16, Potassium 4.6, Sodium 142, GFR 73 05/04/2022 Creatinine 1.20, BUN 17, Potassium 4.4, Sodium  05/01/2022 Creatinine 1.36, BUN 19, Potassium 4.2, Sodium 138, GFR 52 03/03/2022 Creatinine 1.15, BUN 16, Potassium 4.4, Sodium 140, GFR 64 10/22/2021 Creatinine 1.15, BUN 15, Potassium 4.6, Sodium 139 10/01/2021 Creatinine 1.20, BUN 20, Potassium 4.2, Sodium 136, GFR >60  A complete set of results can be found in Results Review.   Recommendations:  No changes and encouraged to call if experiencing any fluid symptoms.   Follow-up plan: ICM clinic phone appointment on 02/22/2023.   91 day device clinic remote transmission 02/11/2023.     EP/Cardiology Office Visits:  Recall 03/26/2023 with Dr Graciela Husbands.    Copy of ICM check sent to Dr. Graciela Husbands.  3 Month HeartLogicT Heart Failure Index:    8 Day Data Trend:          Karie Soda, RN 01/22/2023 12:45 PM

## 2023-02-11 ENCOUNTER — Ambulatory Visit (INDEPENDENT_AMBULATORY_CARE_PROVIDER_SITE_OTHER): Payer: Medicare PPO

## 2023-02-11 DIAGNOSIS — I255 Ischemic cardiomyopathy: Secondary | ICD-10-CM

## 2023-02-11 LAB — CUP PACEART REMOTE DEVICE CHECK
Battery Remaining Longevity: 66 mo
Battery Remaining Percentage: 77 %
Brady Statistic RA Percent Paced: 0 %
Brady Statistic RV Percent Paced: 61 %
Date Time Interrogation Session: 20240606065400
HighPow Impedance: 45 Ohm
Implantable Lead Connection Status: 753985
Implantable Lead Connection Status: 753985
Implantable Lead Connection Status: 753985
Implantable Lead Implant Date: 20040315
Implantable Lead Implant Date: 20100611
Implantable Lead Implant Date: 20100611
Implantable Lead Location: 753858
Implantable Lead Location: 753859
Implantable Lead Location: 753860
Implantable Lead Model: 4196
Implantable Lead Model: 5076
Implantable Lead Model: 6947
Implantable Pulse Generator Implant Date: 20230308
Lead Channel Impedance Value: 447 Ohm
Lead Channel Impedance Value: 450 Ohm
Lead Channel Impedance Value: 478 Ohm
Lead Channel Pacing Threshold Amplitude: 0.8 V
Lead Channel Pacing Threshold Amplitude: 2.5 V
Lead Channel Pacing Threshold Pulse Width: 0.4 ms
Lead Channel Pacing Threshold Pulse Width: 1.5 ms
Lead Channel Setting Pacing Amplitude: 2 V
Lead Channel Setting Pacing Amplitude: 2.5 V
Lead Channel Setting Pacing Amplitude: 3 V
Lead Channel Setting Pacing Pulse Width: 0.4 ms
Lead Channel Setting Pacing Pulse Width: 1.5 ms
Lead Channel Setting Sensing Sensitivity: 0.5 mV
Lead Channel Setting Sensing Sensitivity: 1 mV
Pulse Gen Serial Number: 152344

## 2023-02-22 ENCOUNTER — Encounter: Payer: Self-pay | Admitting: Cardiovascular Disease

## 2023-03-01 ENCOUNTER — Ambulatory Visit: Payer: Medicare PPO | Attending: Internal Medicine

## 2023-03-01 DIAGNOSIS — Z95810 Presence of automatic (implantable) cardiac defibrillator: Secondary | ICD-10-CM | POA: Diagnosis not present

## 2023-03-01 DIAGNOSIS — I5022 Chronic systolic (congestive) heart failure: Secondary | ICD-10-CM

## 2023-03-02 NOTE — Progress Notes (Signed)
EPIC Encounter for ICM Monitoring  Patient Name: [name] is a 82 y.o. male Date: 03/02/2023 Primary Care Physican: Kaleen Mask, MD Primary Cardiologist: Nahser Electrophysiologist: Graciela Husbands 08/17/2022 Weight: 225 lbs 08/26/2022 Weight: 224 lbs 12/15/2022 Weight: 219 lbs 12/23/2022 Weight: 217 lbs 01/22/2023 Weight: 217-220   Time in AT/AF 24.0 hours                                                                        Spoke with patient and heart failure questions reviewed.  Transmission results reviewed.  Pt reports he does not have a lot of energy and has some allergies.     Diet:  He eats restaurant food once a week.  He eats sweets and some snacks.  Does not limit salt intake.    [employer]ic Heart Failure Index 6 suggesting normal fluid levels.      Prescribed: Furosemide 40 mg take 1 tablet by mouth daily Potassium 10 mEq ke 1 tablet by mouth daily Spironolactone 25 mg take 0.5 tablet (12.5 mg total) daily at bedtime   Labs: 12/17/2022 BNP 1,790 09/05/2022 Creatinine 1.17, GFR 62 05/25/2022 Creatinine 1.03, BUN 16, Potassium 4.6, Sodium 142, GFR 73 05/04/2022 Creatinine 1.20, BUN 17, Potassium 4.4, Sodium  05/01/2022 Creatinine 1.36, BUN 19, Potassium 4.2, Sodium 138, GFR 52 03/03/2022 Creatinine 1.15, BUN 16, Potassium 4.4, Sodium 140, GFR 64 10/22/2021 Creatinine 1.15, BUN 15, Potassium 4.6, Sodium 139 10/01/2021 Creatinine 1.20, BUN 20, Potassium 4.2, Sodium 136, GFR >60  A complete set of results can be found in Results Review.   Recommendations:  No changes and encouraged to call if experiencing any fluid symptoms.   Follow-up plan: ICM clinic phone appointment on 04/05/2023.   91 day device clinic remote transmission 05/13/2023.     EP/Cardiology Office Visits:  Recall 03/26/2023 with Dr Graciela Husbands.   06/15/2023 with Dr Elease Hashimoto.   Copy of ICM check sent to Dr. Graciela Husbands.  3 Month HeartLogicT Heart Failure Index:    8 Day Data Trend:          Karie Soda,  RN 03/02/2023 3:29 PM

## 2023-03-03 NOTE — Progress Notes (Signed)
Remote ICD transmission.   

## 2023-03-24 ENCOUNTER — Other Ambulatory Visit: Payer: Self-pay | Admitting: Cardiovascular Disease

## 2023-03-28 LAB — LAB REPORT - SCANNED: EGFR: 67

## 2023-03-29 ENCOUNTER — Telehealth: Payer: Self-pay | Admitting: Gastroenterology

## 2023-03-29 NOTE — Telephone Encounter (Signed)
Inbound call from patient requesting to schedule referral. Has referral for a consult for anorectal manometry. Please advise, thank you.

## 2023-03-30 ENCOUNTER — Other Ambulatory Visit: Payer: Self-pay

## 2023-03-30 DIAGNOSIS — R159 Full incontinence of feces: Secondary | ICD-10-CM

## 2023-03-30 NOTE — Telephone Encounter (Signed)
Scheduled for first opening on [facility] schedule 07/14/23. Called the patient. No answer. Left him a message that the date will be 07/14/23 and the arrival will be 10:00 am. I will mail to him the detailed instructions.

## 2023-03-30 NOTE — Telephone Encounter (Signed)
PT is calling for an update on scheduling. Please advise.

## 2023-04-02 LAB — LAB REPORT - SCANNED: A1c: 6.5

## 2023-04-05 ENCOUNTER — Ambulatory Visit: Payer: Medicare PPO | Attending: Internal Medicine

## 2023-04-05 DIAGNOSIS — I5022 Chronic systolic (congestive) heart failure: Secondary | ICD-10-CM | POA: Diagnosis not present

## 2023-04-05 DIAGNOSIS — Z95810 Presence of automatic (implantable) cardiac defibrillator: Secondary | ICD-10-CM | POA: Diagnosis not present

## 2023-04-06 NOTE — Progress Notes (Signed)
EPIC Encounter for ICM Monitoring  Patient Name: [name] is a 82 y.o. male Date: 04/06/2023 Primary Care Physican: Kaleen Mask, MD Primary Cardiologist: Nahser Electrophysiologist: Graciela Husbands 08/17/2022 Weight: 225 lbs 08/26/2022 Weight: 224 lbs 12/15/2022 Weight: 219 lbs 12/23/2022 Weight: 217 lbs 01/22/2023 Weight: 217-220   Time in AT/AF 24.0 hours                                                                        Spoke with patient and heart failure questions reviewed.  Transmission results reviewed.  Pt asymptomatic for fluid accumulation.  Reports feeling well at this time and voices no complaints.     Diet:  He eats restaurant food once a week.  He eats sweets and some snacks.  Does not limit salt intake.    HeartLogic Heart Failure Index 6 suggesting normal fluid levels.      Prescribed: Furosemide 40 mg take 1 tablet by mouth daily Potassium 10 mEq ke 1 tablet by mouth daily Spironolactone 25 mg take 0.5 tablet (12.5 mg total) daily at bedtime   Labs: 12/17/2022 BNP 1,790 09/05/2022 Creatinine 1.17, GFR 62 05/25/2022 Creatinine 1.03, BUN 16, Potassium 4.6, Sodium 142, GFR 73 05/04/2022 Creatinine 1.20, BUN 17, Potassium 4.4, Sodium  05/01/2022 Creatinine 1.36, BUN 19, Potassium 4.2, Sodium 138, GFR 52 03/03/2022 Creatinine 1.15, BUN 16, Potassium 4.4, Sodium 140, GFR 64 10/22/2021 Creatinine 1.15, BUN 15, Potassium 4.6, Sodium 139 10/01/2021 Creatinine 1.20, BUN 20, Potassium 4.2, Sodium 136, GFR >60  A complete set of results can be found in Results Review.   Recommendations:  No changes and encouraged to call if experiencing any fluid symptoms.   Follow-up plan: ICM clinic phone appointment on 05/11/2023.   91 day device clinic remote transmission 05/13/2023.     EP/Cardiology Office Visits:  Recall 04/17/2023 with Dr Graciela Husbands.   06/15/2023 with Dr Elease Hashimoto.   Copy of ICM check sent to Dr. Graciela Husbands.  3 Month HeartLogicT Heart Failure Index:    8 Day Data  Trend:          Karie Soda, RN 04/06/2023 10:07 AM

## 2023-04-24 ENCOUNTER — Other Ambulatory Visit: Payer: Self-pay | Admitting: Cardiovascular Disease

## 2023-04-24 DIAGNOSIS — I4819 Other persistent atrial fibrillation: Secondary | ICD-10-CM

## 2023-04-24 DIAGNOSIS — Z7901 Long term (current) use of anticoagulants: Secondary | ICD-10-CM

## 2023-04-26 NOTE — Telephone Encounter (Signed)
Prescription refill request for Eliquis received. Indication:afib Last office visit:4/24 Scr:1.09  7/24 Age: 82 Weight:100.4  kg  Prescription refilled

## 2023-04-27 ENCOUNTER — Encounter: Payer: Medicare PPO | Attending: Physician Assistant | Admitting: Physician Assistant

## 2023-04-27 DIAGNOSIS — I5042 Chronic combined systolic (congestive) and diastolic (congestive) heart failure: Secondary | ICD-10-CM | POA: Insufficient documentation

## 2023-04-27 DIAGNOSIS — Z7901 Long term (current) use of anticoagulants: Secondary | ICD-10-CM | POA: Diagnosis not present

## 2023-04-27 DIAGNOSIS — N189 Chronic kidney disease, unspecified: Secondary | ICD-10-CM | POA: Diagnosis not present

## 2023-04-27 DIAGNOSIS — I2510 Atherosclerotic heart disease of native coronary artery without angina pectoris: Secondary | ICD-10-CM | POA: Diagnosis not present

## 2023-04-27 DIAGNOSIS — I480 Paroxysmal atrial fibrillation: Secondary | ICD-10-CM | POA: Insufficient documentation

## 2023-04-27 DIAGNOSIS — L97822 Non-pressure chronic ulcer of other part of left lower leg with fat layer exposed: Secondary | ICD-10-CM | POA: Insufficient documentation

## 2023-04-27 DIAGNOSIS — Z950 Presence of cardiac pacemaker: Secondary | ICD-10-CM | POA: Insufficient documentation

## 2023-04-27 DIAGNOSIS — H409 Unspecified glaucoma: Secondary | ICD-10-CM | POA: Insufficient documentation

## 2023-04-27 DIAGNOSIS — I739 Peripheral vascular disease, unspecified: Secondary | ICD-10-CM | POA: Diagnosis not present

## 2023-04-27 DIAGNOSIS — M1990 Unspecified osteoarthritis, unspecified site: Secondary | ICD-10-CM | POA: Insufficient documentation

## 2023-04-27 DIAGNOSIS — I890 Lymphedema, not elsewhere classified: Secondary | ICD-10-CM | POA: Diagnosis present

## 2023-04-27 DIAGNOSIS — I130 Hypertensive heart and chronic kidney disease with heart failure and stage 1 through stage 4 chronic kidney disease, or unspecified chronic kidney disease: Secondary | ICD-10-CM | POA: Diagnosis not present

## 2023-04-27 DIAGNOSIS — I872 Venous insufficiency (chronic) (peripheral): Secondary | ICD-10-CM | POA: Insufficient documentation

## 2023-05-04 ENCOUNTER — Encounter: Payer: Medicare PPO | Admitting: Internal Medicine

## 2023-05-04 DIAGNOSIS — L97822 Non-pressure chronic ulcer of other part of left lower leg with fat layer exposed: Secondary | ICD-10-CM | POA: Diagnosis not present

## 2023-05-04 NOTE — Progress Notes (Signed)
LASH, HOLLOMON ([id number]) [id number] Page 1 of 6 Visit Report for 05/04/2023 HPI Details Patient Name: Date of Service: CALE, MAZZOCCHI RD L. 05/04/2023 2:30 PM Medical Record Number: [id number] Patient Account Number: [id number] Date of Birth/Sex: Treating RN: 1941/01/24 (82 y.o. [name]) Yevonne Pax Primary Care Provider: Windle Guard Other Clinician: Referring Provider: Treating Provider/Extender: RO BSO N, MICHA EL Toya Smothers in Treatment: 1 History of Present Illness HPI Description: 03/18/17 on evaluation today patient presents for initial visit concerning an ulcer he has on the left lateral lower extremity which has been present since mid February 2018. Unfortunately due to other family circumstances evaluation and treatment of this wound has been put on hold until this point when they could finally get him into be seen. He does have high blood pressure but has no history of diabetes though he does tell me that his cardiologist had been monitoring him for hyperglycemia and he is "borderline". He does have some discomfort in regard to this wound but fortunately this is not severe and typically with cleansing. His main concern is why this wound is not healing as in the past he has never had any difficulty with healing. The initial injury was during the period of time where he was helping a friend with some work and scrape the leg on a brick during that process. It has just never healed since that time. Patient is on chronic anticoagulant therapy and has not had any cardiovascular procedures such as arterial or venous imaging. 03/25/2017 - I'm seeing the patient for the first time today and notes that he has had a injury to the left lower extremity since mid February and from what I understand a hematoma opened out into a lacerated wound and this has been slow to heal. He has had no history of previous arterial or venous problems and has had some cardiac  history with stents placed. He is not a diabetic and not a smoker. During his previous visit there has been a arterial and venous duplex study ordered and this is pending 04/01/2017 -- arterial and venous duplex studies still pending 04/08/2017 -- lower extremity venous duplex reflux evaluation done on 04/02/2017 showed no evidence of venous incompetence in both left and right great saphenous veins. lower extremity arterial duplex evaluation done on 04/06/2017 -- no evidence of hemodynamically significant arterial occlusive disease bilaterally. the right ABI was 1.19 with a toe pressure of 0.83 and the left ABI was 1.25 with a toe pressures of 0.78 and triphasic flow. these were all normal. 04/15/17 on evaluation today patient appears to be doing fairly well in regard to his right lower extremity wound. He has been tolerating the dressing without complication. His biggest thing is that he states he was "hoping that today would be the last visit and he will not have to come back". Nonetheless he still has an open wound noted and I explained to him that we do want to see him back to obviously work toward getting this to close appropriately. Fortunately we did have the results as noted above of his arterial duplex study and venous studies which appear to be doing very well. Overall I do believe he is progressing nicely. No fevers, chills, nausea, or vomiting noted at this time. 04/22/17 on evaluation today patient's left lower from the wound appears to be doing about the same as last week's evaluation. He also has a superficial skin tear in the lateral periwound that I believe is due to the current  dressing that is the [facility] Dressing actually sticking to his skin. I think this could be potentially damaging the wound bed as well. With that being said he is not having any significant pain. No fevers, chills, nausea, or vomiting noted at this time. Readmission: 02/26/2020 patient presents today for  reevaluation here clinically has not been seen since August 2018. With that being said he tells me that about a week ago he did sustain an injury to his leg during a fall. Fortunately there is no signs of active infection at this time. No fevers, chills, nausea, vomiting, or diarrhea. He tells me that he has "not been putting antibiotic ointment on it because that will not help it heal". Mainly they have just been wrapping this with nothing on it and cleaning with saline in between. Fortunately there is no signs of obvious infection although I am concerned about that possibility with the drainage she is having I think it may be more due to swelling than anything. He does have a wound on the upper arm as well on the right but this appears to be almost completely healed I am not really concerned about this I think just a protective bandage is all we really need here. 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 03/04/2020 upon evaluation today patient actually appears to be doing quite well with regard to his wounds he is tolerating the dressing changes without complication and overall he is going require some sharp debridement today but this is minimal and for the most part his wounds are healed. Overall I think he is progressing quite nicely. Readmission: 04-27-2023 upon evaluation today patient presents for reevaluation here in the clinic although it has been a number of years since have seen him in fact it looks to be 3 years and a little bit more. Nonetheless at this point today he does have an issue that is going on with a wound on the left posterior lower leg this does not appear to be too significant compared to what we previously dealt with. I think this is something looks to be a possibility of healing quite rapidly which is good news. Fortunately however I do not see any  signs of infection at the moment which is very good news. He has been compression socks that he does wear. Patient has a history of lymphedema, chronic venous insufficiency, hypertension, long-term use of anticoagulant therapy, he has a cardiac pacemaker, atrial fibrillation, coronary artery disease, chronic kidney disease, and congestive heart failure. 8/27; the patient's wound on the right medial lower leg is healed. He does not have medical grade compression stockings Electronic Signature(s) Signed: 05/04/2023 5:06:52 PM By: Baltazar Najjar MD Koons, [name] ([id number]) [id number] Page 2 of 6 Entered By: Baltazar Najjar on 05/04/2023 14:23:09 -------------------------------------------------------------------------------- Physical Exam Details Patient Name: Date of Service: DARTANIAN, FAHRENKRUG RD L. 05/04/2023 2:30 PM Medical Record Number: [id number] Patient Account Number: [id number] Date of Birth/Sex: Treating RN: [birth year] (82 y.o. [name]) Yevonne Pax Primary Care Provider: Windle Guard Other Clinician: Referring Provider: Treating Provider/Extender: RO BSO N, MICHA EL Toya Smothers in Treatment: 1 Constitutional Sitting or standing Blood Pressure is within target range for patient.. Pulse regular and within target range for patient.[name] Respirations regular, non-labored and within target range.. Temperature is normal and within the target range for the patient.[name] appears in no distress. Notes Wound exam; the area on  the left medial lower leg is healed. The patient has skin changes of hemosiderin chronic venous insufficiency and some degree of edema that is nonpitting in the right leg suggestive of lymphedema. Electronic Signature(s) Signed: 05/04/2023 5:06:52 PM By: Baltazar Najjar MD Entered By: Baltazar Najjar on 05/04/2023 14:24:09 -------------------------------------------------------------------------------- Physician Orders Details Patient Name:  Date of Service: [name] 05/04/2023 2:30 PM Medical Record Number: [id number] Patient Account Number: [id number] Date of Birth/Sex: Treating RN: [birth year] (82 y.o. [name] Primary Care Provider: Windle Guard Other Clinician: Referring Provider: Treating Provider/Extender: RO BSO Dorris Carnes, MICHA EL Toya Smothers in Treatment: 1 Verbal / Phone Orders: No Diagnosis Coding Discharge From [facility] Services Discharge from Wound Care Center Treatment Complete Wear compression garments daily. Put garments on first thing when you wake up and remove them before bed. - Tubi grip size D until they arrive Moisturize legs daily after removing compression garments. Electronic Signature(s) Signed: 05/04/2023 2:18:03 PM By: Yevonne Pax RN Signed: 05/04/2023 5:06:52 PM By: Baltazar Najjar MD Entered By: Yevonne Pax on 05/04/2023 14:18:03 Odaniel, [name] ([id number]) [id number] Page 3 of 6 -------------------------------------------------------------------------------- Problem List Details Patient Name: Date of Service: ZIAH, DEIS RD L. 05/04/2023 2:30 PM Medical Record Number: [id number] Patient Account Number: [id number] Date of Birth/Sex: Treating RN: 1941/01/19 (82 y.o. 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/20/2024 No Yes I10 Essential (primary) hypertension 04/27/2023 No Yes Z79.01 Long term (current) use of anticoagulants 04/27/2023 No Yes Z95.0 Presence of cardiac  pacemaker 04/27/2023 No Yes I25.10 Atherosclerotic heart disease of native coronary artery without angina pectoris 04/27/2023 No Yes N18.9 Chronic kidney disease, unspecified 04/27/2023 No Yes I50.42 Chronic combined systolic (congestive) and diastolic (congestive) heart failure 04/27/2023 No Yes I48.0 Paroxysmal atrial fibrillation 04/27/2023 No Yes Inactive Problems Resolved Problems Electronic Signature(s) Signed: 05/04/2023 5:06:52 PM By: Baltazar Najjar MD Savona, [name] ([id number]) [id number] Page 4 of 6 Entered By: Baltazar Najjar on 05/04/2023 14:22:06 -------------------------------------------------------------------------------- Progress Note Details Patient Name: Date of Service: KAYLA, MERTEL RD L. 05/04/2023 2:30 PM Medical Record Number: [id number] Patient Account Number: [id number] Date of Birth/Sex: Treating RN: [birth year] (82 y.o. [name]) Yevonne Pax Primary Care Provider: Windle Guard Other Clinician: Referring Provider: Treating Provider/Extender: RO BSO N, MICHA EL Toya Smothers in Treatment: 1 Subjective History of Present Illness (HPI) 03/18/17 on evaluation today patient presents for initial visit concerning an ulcer he has on the left lateral lower extremity which has been present since mid February 2018. Unfortunately due to other family circumstances evaluation and treatment of this wound has been put on hold until this point when they could finally get him into be seen. He does have high blood pressure but has no history of diabetes though he does tell me that his cardiologist had been monitoring him for hyperglycemia and he is "borderline". He does have some discomfort in regard to this wound but fortunately this is not severe and typically with cleansing. His main concern is why this wound is not healing as in the past he has never had any difficulty with healing. The initial injury was during the period  of time where he was helping  a friend with some work and scrape the leg on a brick during that process. It has just never healed since that time. Patient is on chronic anticoagulant therapy and has not had any cardiovascular procedures such as arterial or venous imaging. 03/25/2017 - I'm seeing the patient for the first time today and notes that he has had a injury to the left lower extremity since mid February and from what I understand a hematoma opened out into a lacerated wound and this has been slow to heal. He has had no history of previous arterial or venous problems and has had some cardiac history with stents placed. He is not a diabetic and not a smoker. During his previous visit there has been a arterial and venous duplex study ordered and this is pending 04/01/2017 -- arterial and venous duplex studies still pending 04/08/2017 -- lower extremity venous duplex reflux evaluation done on 04/02/2017 showed no evidence of venous incompetence in both left and right great saphenous veins. lower extremity arterial duplex evaluation done on 04/06/2017 -- no evidence of hemodynamically significant arterial occlusive disease bilaterally. the right ABI was 1.19 with a toe pressure of 0.83 and the left ABI was 1.25 with a toe pressures of 0.78 and triphasic flow. these were all normal. 04/15/17 on evaluation today patient appears to be doing fairly well in regard to his right lower extremity wound. He has been tolerating the dressing without complication. His biggest thing is that he states he was "hoping that today would be the last visit and he will not have to come back". Nonetheless he still has an open wound noted and I explained to him that we do want to see him back to obviously work toward getting this to close appropriately. Fortunately we did have the results as noted above of his arterial duplex study and venous studies which appear to be doing very well. Overall I do believe he is progressing nicely. No fevers, chills,  nausea, or vomiting noted at this time. 04/22/17 on evaluation today patient's left lower from the wound appears to be doing about the same as last week's evaluation. He also has a superficial skin tear in the lateral periwound that I believe is due to the current dressing that is the [facility] Dressing actually sticking to his skin. I think this could be potentially damaging the wound bed as well. With that being said he is not having any significant pain. No fevers, chills, nausea, or vomiting noted at this time. Readmission: 02/26/2020 patient presents today for reevaluation here clinically has not been seen since August 2018. With that being said he tells me that about a week ago he did sustain an injury to his leg during a fall. Fortunately there is no signs of active infection at this time. No fevers, chills, nausea, vomiting, or diarrhea. He tells me that he has "not been putting antibiotic ointment on it because that will not help it heal". Mainly they have just been wrapping this with nothing on it and cleaning with saline in between. Fortunately there is no signs of obvious infection although I am concerned about that possibility with the drainage she is having I think it may be more due to swelling than anything. He does have a wound on the upper arm as well on the right but this appears to be almost completely healed I am not really concerned about this I think just a protective bandage is all we really  need here. 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 03/04/2020 upon evaluation today patient actually appears to be doing quite well with regard to his wounds he is tolerating the dressing changes without complication and overall he is going require some sharp debridement today but this is minimal and for the most part his wounds are healed. Overall I think he  is progressing quite nicely. Readmission: 04-27-2023 upon evaluation today patient presents for reevaluation here in the clinic although it has been a number of years since have seen him in fact it looks to be 3 years and a little bit more. Nonetheless at this point today he does have an issue that is going on with a wound on the left posterior lower leg this does not appear to be too significant compared to what we previously dealt with. I think this is something looks to be a possibility of healing quite rapidly which is good news. Fortunately however I do not see any signs of infection at the moment which is very good news. He has been compression socks that he does wear. Patient has a history of lymphedema, chronic venous insufficiency, hypertension, long-term use of anticoagulant therapy, he has a cardiac pacemaker, atrial fibrillation, coronary artery disease, chronic kidney disease, and congestive heart failure. 8/27; the patient's wound on the right medial lower leg is healed. He does not have medical grade compression stockings Behrmann, Laban L ([id number]) [id number] Page 5 of 6 Objective Constitutional Sitting or standing Blood Pressure is within target range for patient.. Pulse regular and within target range for patient.[name] Respirations regular, non-labored and within target range.. Temperature is normal and within the target range for the patient.[name] appears in no distress. Vitals Time Taken: 3:56 PM, Height: 69 in, Weight: 220 lbs, BMI: 32.5, Temperature: 99 F, Pulse: 81 bpm, Respiratory Rate: 18 breaths/min, Blood Pressure: 103/55 mmHg. General Notes: Wound exam; the area on the left medial lower leg is healed. The patient has skin changes of hemosiderin chronic venous insufficiency and some degree of edema that is nonpitting in the right leg suggestive of lymphedema. Integumentary (Hair, Skin) Wound #6 status is Open. Original cause of wound was Trauma. The  date acquired was: 03/08/2023. The wound has been in treatment 1 weeks. The wound is located on the Left,Posterior Lower Leg. The wound measures 0cm length x 0cm width x 0cm depth; 0cm^2 area and 0cm^3 volume. There is no tunneling or undermining noted. There is a none present amount of drainage noted. There is no granulation within the wound bed. There is no necrotic tissue within the wound bed. 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 Put garments on first thing when you wake up and remove them before bed. - Tubi grip size D until they arrive Moisturize legs daily after removing compression garments. 1. The patient can be discharged from the wound care center the wound is healed 2. Gave him instructions on elastic  therapy 20/30 below-knee stockings 3. Skin lubrication to the lower extremities nightly Electronic Signature(s) Signed: 05/04/2023 5:06:52 PM By: Baltazar Najjar MD Entered By: Baltazar Najjar on 05/04/2023 14:24:46 -------------------------------------------------------------------------------- SuperBill Details Patient Name: Date of Service: [name] 05/04/2023 DONYAE, TRIPP ([id number]) [id number] Page 6 of 6 Medical Record Number: [id number] Patient Account Number: [id number] Date of  Birth/Sex: Treating RN: 10/26/1940 (82 y.o. [name]) Yevonne Pax Primary Care Provider: Windle Guard Other Clinician: Referring Provider: Treating Provider/Extender: RO BSO Dorris Carnes, MICHA EL Toya Smothers in Treatment: 1 Diagnosis Coding ICD-10 Codes Code Description I89.0 Lymphedema, not elsewhere classified I87.332 Chronic venous hypertension (idiopathic) with ulcer and inflammation of left lower extremity L97.822 Non-pressure chronic ulcer of other part of left lower leg with fat layer exposed I10 Essential (primary) hypertension Z79.01 Long term (current) use of anticoagulants Z95.0 Presence of cardiac pacemaker I25.10 Atherosclerotic heart disease of native coronary artery without angina pectoris N18.9 Chronic kidney disease, unspecified I50.42 Chronic combined systolic (congestive) and diastolic (congestive) heart failure I48.0 Paroxysmal atrial fibrillation Facility Procedures : CPT4 Code: [id number] Description: 74259 - WOUND CARE VISIT-LEV 1 EST PT Modifier: Quantity: 1 Physician Procedures : CPT4 Code Description Modifier [id number] - WC PHYS LEVEL 3 - EST PT ICD-10 Diagnosis Description I89.0 Lymphedema, not elsewhere classified I87.332 Chronic venous hypertension (idiopathic) with ulcer and inflammation of left lower extremity Quantity: 1 Electronic Signature(s) Signed: 05/04/2023 5:06:52 PM By: Baltazar Najjar MD Previous Signature: 05/04/2023 2:18:39 PM Version By: Yevonne Pax RN Entered By: Baltazar Najjar on 05/04/2023 14:25:05

## 2023-05-06 ENCOUNTER — Ambulatory Visit: Payer: Medicare PPO | Admitting: Internal Medicine

## 2023-05-07 NOTE — Progress Notes (Signed)
[name], [name] ([id number]) [id number] Page 1 of 8 Visit Report for 05/04/2023 Arrival Information Details Patient Name: Date of Service: [name], James RD [name]. 05/04/2023 2:30 PM Medical Record Number: [id number] Patient Account Number: [id number] Date of Birth/Sex: Treating RN: January [birth year], [name] (82 y.o. [name]) Yevonne Pax Primary Care Ozias Dicenzo: Windle Guard Other Clinician: Referring Cheree Fowles: Treating Jarvis Knodel/Extender: RO BSO N, MICHA EL Toya Smothers in Treatment: 1 Visit Information History Since Last Visit Added or deleted any medications: No Patient Arrived: Ambulatory Any new allergies or adverse reactions: No Arrival Time: 13:49 Had a fall or experienced change in No Accompanied By: self activities of daily living that James affect Transfer Assistance: None risk of falls: Patient Identification Verified: Yes Signs or symptoms of abuse/neglect since last visito No Secondary Verification Process Completed: Yes Hospitalized since last visit: No Patient Requires Transmission-Based Precautions: No Implantable device outside of the clinic excluding No Patient Has Alerts: No cellular tissue based products placed in the center since last visit: Has Dressing in Place as Prescribed: Yes Has Compression in Place as Prescribed: Yes Pain Present Now: No Electronic Signature(s) Signed: 05/07/2023 11:58:03 AM By: Yevonne Pax RN Entered By: Yevonne Pax on [name]/27/2024 10:50:24 -------------------------------------------------------------------------------- Clinic Level of Care Assessment Details Patient Name: Date of Service: [name], James RD [name]. 05/04/2023 2:30 PM Medical Record Number: [id number] Patient Account Number: [id number] Date of Birth/Sex: Treating RN: August [birth year], [name] (82 y.o. [name]) Yevonne Pax Primary Care Dayln Tugwell: Windle Guard Other Clinician: Referring Anishka Bushard: Treating Darren Caldron/Extender: RO BSO N, MICHA EL Toya Smothers in Treatment:  1 Clinic Level of Care Assessment Items TOOL 4 Quantity Score X- 1 0 Use when only an EandM is performed on FOLLOW-UP visit ASSESSMENTS - Nursing Assessment / Reassessment []  - 0 Reassessment of Co-morbidities (includes updates in patient status) []  - 0 Reassessment of Adherence to Treatment Plan [name], [name] ([id number]) [id number] Page 2 of 8 ASSESSMENTS - Wound and Skin A ssessment / Reassessment X - Simple Wound Assessment / Reassessment - one wound 1 5 []  - 0 Complex Wound Assessment / Reassessment - multiple wounds []  - 0 Dermatologic / Skin Assessment (not related to wound area) ASSESSMENTS - Focused Assessment []  - 0 Circumferential Edema Measurements - multi extremities []  - 0 Nutritional Assessment / Counseling / Intervention []  - 0 Lower Extremity Assessment (monofilament, tuning fork, pulses) []  - 0 Peripheral Arterial Disease Assessment (using hand held doppler) ASSESSMENTS - Ostomy and/or Continence Assessment and Care []  - 0 Incontinence Assessment and Management []  - 0 Ostomy Care Assessment and Management (repouching, etc.) PROCESS - Coordination of Care X - Simple Patient / Family Education for ongoing care 1 15 []  - 0 Complex (extensive) Patient / Family Education for ongoing care []  - 0 Staff obtains Chiropractor, Records, T Results / Process Orders est []  - 0 Staff telephones HHA, Nursing Homes / Clarify orders / etc []  - 0 Routine Transfer to another Facility (non-emergent condition) []  - 0 Routine Hospital Admission (non-emergent condition) []  - 0 New Admissions / Manufacturing engineer / Ordering NPWT Apligraf, etc. , []  - 0 Emergency Hospital Admission (emergent condition) X- 1 10 Simple Discharge Coordination []  - 0 Complex (extensive) Discharge Coordination PROCESS - Special Needs []  - 0 Pediatric / Minor Patient Management []  - 0 Isolation Patient Management []  - 0 Hearing / Language / Visual special  needs []  - 0 Assessment of Community assistance (transportation, D/C planning, etc.) []  - 0 Additional assistance / Altered mentation []  - 0 Support Surface(s) Assessment (bed,  cushion, seat, etc.) INTERVENTIONS - Wound Cleansing / Measurement []  - 0 Simple Wound Cleansing - one wound []  - 0 Complex Wound Cleansing - multiple wounds []  - 0 Wound Imaging (photographs - any number of wounds) []  - 0 Wound Tracing (instead of photographs) []  - 0 Simple Wound Measurement - one wound []  - 0 Complex Wound Measurement - multiple wounds INTERVENTIONS - Wound Dressings []  - 0 Small Wound Dressing one or multiple wounds []  - 0 Medium Wound Dressing one or multiple wounds []  - 0 Large Wound Dressing one or multiple wounds []  - 0 Application of Medications - topical []  - 0 Application of Medications - injection INTERVENTIONS - Miscellaneous []  - 0 External ear exam [name], [name] ([id number]) [id number] Page 3 of 8 []  - 0 Specimen Collection (cultures, biopsies, blood, body fluids, etc.) []  - 0 Specimen(s) / Culture(s) sent or taken to Lab for analysis []  - 0 Patient Transfer (multiple staff / Michiel Sites Lift / Similar devices) []  - 0 Simple Staple / Suture removal (25 or less) []  - 0 Complex Staple / Suture removal (26 or more) []  - 0 Hypo / Hyperglycemic Management (close monitor of Blood Glucose) []  - 0 Ankle / Brachial Index (ABI) - do not check if billed separately X- 1 5 Vital Signs Has the patient been seen at the hospital within the last three years: Yes Total Score: 35 Level Of Care: New/Established - Level 1 Electronic Signature(s) Signed: 05/07/2023 11:58:03 AM By: Yevonne Pax RN Entered By: Yevonne Pax on [name]/27/2024 11:18:32 -------------------------------------------------------------------------------- Encounter Discharge Information Details Patient Name: Date of Service: James Kil RD [name]. 05/04/2023 2:30 PM Medical Record Number:  [id number] Patient Account Number: [id number] Date of Birth/Sex: Treating RN: [birth year] (82 y.o. [name] Primary Care Froilan Mclean: Windle Guard Other Clinician: Referring Madalene Mickler: Treating Jerico Grisso/Extender: RO BSO N, MICHA EL Toya Smothers in Treatment: 1 Encounter Discharge Information Items Discharge Condition: Stable Ambulatory Status: Ambulatory Discharge Destination: Home Transportation: Private Auto Accompanied By: self Schedule Follow-up Appointment: Yes Clinical Summary of Care: Electronic Signature(s) Signed: 05/04/2023 2:20:29 PM By: Yevonne Pax RN Entered By: Yevonne Pax on [name]/27/2024 11:20:29 Lower Extremity Assessment Details -------------------------------------------------------------------------------- [name] ([id number]) [id number] Page 4 of 8 Patient Name: Date of Service: GABLE, PERCH RD [name]. 05/04/2023 2:30 PM Medical Record Number: [id number] Patient Account Number: [id number] Date of Birth/Sex: Treating RN: [birth year][name] (82 y.o. [name]) Yevonne Pax Primary Care Etola Mull: Windle Guard Other Clinician: Referring Bea Duren: Treating Vessie Olmsted/Extender: RO BSO N, MICHA EL Toya Smothers in Treatment: 1 Edema Assessment Left: Right: Assessed: No No Edema: No Calf Left: Right: Point of Measurement: 38 cm From Medial Instep 34 cm Ankle Left: Right: Point of Measurement: 12 cm From Medial Instep 22 cm Knee To Floor Left: Right: From Medial Instep 46 cm Vascular Assessment Left: Right: Extremity colors, hair growth, and conditions: Hair Growth on Extremity: No Temperature of Extremity: Warm Capillary Refill: < 3 seconds Dependent Rubor: No Blanched when Elevated: No Lipodermatosclerosis: No Toe Nail Assessment Left: Right: Thick: No Discolored: No Deformed: No Improper Length and Hygiene: No Electronic Signature(s) Signed: 05/07/2023 11:58:03 AM By: Yevonne Pax RN Entered By: Yevonne Pax on  [name]/27/2024 11:10:21 -------------------------------------------------------------------------------- Multi Wound Chart Details Patient Name: Date of Service: James Kil RD [name]. 05/04/2023 2:30 PM Medical Record Number: [id number] Patient Account Number: [id number] Date of Birth/Sex: Treating RN: April [birth year], [name] (82 y.o. [name] Primary Care Kayslee Furey: Windle Guard Other Clinician: Referring Daric Koren: Treating Edinson Domeier/Extender: RO BSO N, MICHA EL  Adonis Housekeeper Weeks in Treatment: 1 Vital Signs Height(in): 69 Pulse(bpm): 81 Weight(lbs): 220 Blood Pressure(mmHg): 103/55 Body Mass Index(BMI): 32.5 Temperature(F): 99 [name], [name] ([id number]) [id number] Page 5 of 8 Respiratory Rate(breaths/min): 18 [6:Photos: No Photos Left, Posterior Lower Leg Wound Location: Trauma Wounding Event: Venous Leg Ulcer Primary Etiology: Cataracts, Glaucoma, Arrhythmia, Comorbid History: Coronary Artery Disease, Hypertension, Myocardial Infarction, Peripheral Arterial Disease,  Peripheral Venous Disease, Gout, Osteoarthritis 03/08/2023 Date Acquired: 1 Weeks of Treatment: Open Wound Status: No Wound Recurrence: 0x0x0 Measurements [name] x W x D (cm) 0 A (cm) : rea 0 Volume (cm) : 100.00% % Reduction in Area: 100.00% % Reduction in  Volume: Full Thickness Without Exposed Classification: Support Structures None Present Exudate Amount: None Present (0%) Granulation Amount: None Present (0%) Necrotic Amount: Fascia: No Exposed Structures: Fat Layer (Subcutaneous Tissue): No Tendon: No  Muscle: No Joint: No Bone: No None Epithelialization:] [N/A:N/A N/A N/A N/A N/A N/A N/A N/A N/A N/A N/A N/A N/A N/A N/A N/A N/A N/A N/A N/A] Treatment Notes Electronic Signature(s) Signed: 05/04/2023 2:17:20 PM By: Yevonne Pax RN Entered By: Yevonne Pax on [name]/27/2024 11:17:20 -------------------------------------------------------------------------------- Multi-Disciplinary Care Plan Details Patient Name:  Date of Service: James Kil RD [name]. 05/04/2023 2:30 PM Medical Record Number: [id number] Patient Account Number: [id number] Date of Birth/Sex: Treating RN: [birth year][name] (82 y.o. [name] Primary Care Cayde Held: Windle Guard Other Clinician: Referring Amaree Loisel: Treating Korine Winton/Extender: RO BSO N, MICHA EL Toya Smothers in Treatment: 1 Active Inactive Electronic Signature(s) Signed: 05/04/2023 2:19:06 PM By: Yevonne Pax RN Entered By: Yevonne Pax on [name]/27/2024 11:19:06 [name], [name] ([id number]) [id number] Page 6 of 8 -------------------------------------------------------------------------------- Pain Assessment Details Patient Name: Date of Service: [name], James RD [name]. 05/04/2023 2:30 PM Medical Record Number: [id number] Patient Account Number: [id number] Date of Birth/Sex: Treating RN: [birth year] (82 y.o. [name]) Yevonne Pax Primary Care Jeyren Danowski: Windle Guard Other Clinician: Referring Tristine Langi: Treating Maximilian Tallo/Extender: RO BSO N, MICHA EL Toya Smothers in Treatment: 1 Active Problems Location of Pain Severity and Description of Pain Patient Has Paino No Site Locations Pain Management and Medication Current Pain Management: Electronic Signature(s) Signed: 05/07/2023 11:58:03 AM By: Yevonne Pax RN Entered By: Yevonne Pax on [name]/27/2024 11:[name]:53 -------------------------------------------------------------------------------- Patient/Caregiver Education Details Patient Name: Date of Service: James Kil RD Elbert Ewings 8/27/2024andnbsp2:30 PM Medical Record Number: [id number] Patient Account Number: [id number] Date of Birth/Gender: Treating RN: [birth year][name] (82 y.o. [name] Primary Care Physician: Windle Guard Other Clinician: Referring Physician: Treating Physician/Extender: Chauncey Mann, MICHA EL Toya Smothers in Treatment: 1 Mcglinchey, [name] ([id number]) [phone].pdf Page 7 of 8 Education  Assessment Education Provided To: Patient Education Topics Provided Wound/Skin Impairment: Handouts: Other: discharge instructions Methods: Explain/Verbal Responses: State content correctly Electronic Signature(s) Signed: 05/07/2023 11:58:03 AM By: Yevonne Pax RN Entered By: Yevonne Pax on [name]/27/2024 11:19:36 -------------------------------------------------------------------------------- Wound Assessment Details Patient Name: Date of Service: James Kil RD [name]. 05/04/2023 2:30 PM Medical Record Number: [id number] Patient Account Number: [id number] Date of Birth/Sex: Treating RN: [name], [name] (82 y.o. [name]) Yevonne Pax Primary Care Derryck Shahan: Windle Guard Other Clinician: Referring [name]: Treating Arlenis Blaydes/Extender: RO BSO N, MICHA EL Toya Smothers in Treatment: 1 Wound Status Wound Number: 6 Primary Venous Leg Ulcer Etiology: Wound Location: Left, Posterior Lower Leg Wound Open Wounding Event: Trauma Status: Date Acquired: 03/08/2023 Comorbid Cataracts, Glaucoma, Arrhythmia, Coronary Artery Disease, Weeks Of Treatment: 1 History: Hypertension, Myocardial Infarction, Peripheral Arterial Disease, Clustered Wound: No Peripheral Venous Disease, Gout, Osteoarthritis Wound Measurements Length: (cm) Width: (cm)  Depth: (cm) Area: (cm) Volume: (cm) 0 % Reduction in Area: 100% 0 % Reduction in Volume: 100% 0 Epithelialization: None 0 Tunneling: No 0 Undermining: No Wound Description Classification: Full Thickness Without Exposed Support Structures Exudate Amount: None Present Foul Odor After Cleansing: No Slough/Fibrino No Wound Bed Granulation Amount: None Present (0%) Exposed Structure Necrotic Amount: None Present (0%) Fascia Exposed: No Fat Layer (Subcutaneous Tissue) Exposed: No Tendon Exposed: No Muscle Exposed: No Joint Exposed: No Bone Exposed: No Electronic Signature(s) Signed: 05/07/2023 11:58:03 AM By: Yevonne Pax RN Mustapha, [name] ([id number])  [id number] Page 8 of 8 Entered By: Yevonne Pax on [name]/27/2024 11:09:24 -------------------------------------------------------------------------------- Vitals Details Patient Name: Date of Service: GURSHAWN, PICKET RD [name]. 05/04/2023 2:30 PM Medical Record Number: [id number] Patient Account Number: [id number] Date of Birth/Sex: Treating RN: [birth year] (82 y.o. [name]) Yevonne Pax Primary Care Analysa Nutting: Windle Guard Other Clinician: Referring Ercia Crisafulli: Treating Katera Rybka/Extender: RO BSO N, MICHA EL Toya Smothers in Treatment: 1 Vital Signs Time Taken: 15:56 Temperature (F): 99 Height (in): 69 Pulse (bpm): 81 Weight (lbs): 220 Respiratory Rate (breaths/min): 18 Body Mass Index (BMI): 32.5 Blood Pressure (mmHg): 103/55 Reference Range: 80 - 120 mg / dl Electronic Signature(s) Signed: 05/07/2023 11:58:03 AM By: Yevonne Pax RN Entered By: Yevonne Pax on [name]/27/2024 11:[name]:35

## 2023-05-07 NOTE — Progress Notes (Signed)
LESHUN, STENGLEIN ([id number]) [phone] [id number].pdf Page 1 of 5 Visit Report for 04/27/2023 Abuse Risk Screen Details Patient Name: Date of Service: [name], James RD L. 04/27/2023 9:30 A M Medical Record Number: [id number] Patient Account Number: [id number] Date of Birth/Sex: Treating RN: [birth year] (82 y.o. [name]) Yevonne Pax Primary Care Loni Delbridge: Windle Guard Other Clinician: Referring Estevon Fluke: Treating Nita Whitmire/Extender: Ashley Mariner in Treatment: 0 Abuse Risk Screen Items Answer ABUSE RISK SCREEN: Has anyone close to you tried to hurt or harm you recentlyo No Do you feel uncomfortable with anyone in your familyo No Has anyone forced you do things that you didnt want to doo No Electronic Signature(s) Signed: 05/07/2023 11:59:03 AM By: Yevonne Pax RN Entered By: Yevonne Pax on 04/27/2023 06:46:55 -------------------------------------------------------------------------------- Activities of Daily Living Details Patient Name: Date of Service: [name], [name] 04/27/2023 9:30 A M Medical Record Number: [id number] Patient Account Number: [id number] Date of Birth/Sex: Treating RN: 08-Jun-1941 (82 y.o. [name]) Yevonne Pax Primary Care Britini Garcilazo: Windle Guard Other Clinician: Referring Adellyn Capek: Treating Mekala Winger/Extender: Ashley Mariner in Treatment: 0 Activities of Daily Living Items Answer Activities of Daily Living (Please select one for each item) Drive Automobile Completely Able T Medications ake Completely Able Use T elephone Completely Able Care for Appearance Completely Able Use T oilet Completely Able Bath / Shower Completely Able Dress Self Completely Able Feed Self Completely Able Walk Completely Able Get In / Out Bed Completely Able Housework Completely Able ASAIAH, GUMAN ([id number]) [id number] [id number] Page 2 of 5 Prepare Meals Completely Able Handle Money Completely Able Shop  for Self Completely Able Electronic Signature(s) Signed: 05/07/2023 11:59:03 AM By: Yevonne Pax RN Entered By: Yevonne Pax on 04/27/2023 06:47:24 -------------------------------------------------------------------------------- Education Screening Details Patient Name: Date of Service: [name] 04/27/2023 9:30 A M Medical Record Number: [id number] Patient Account Number: [id number] Date of Birth/Sex: Treating RN: 1941-05-11 (82 y.o. [name]) Yevonne Pax Primary Care Markel Kurtenbach: Windle Guard Other Clinician: Referring Jaxyn Mestas: Treating Azizah Lisle/Extender: Ashley Mariner in Treatment: 0 Primary Learner Assessed: Patient Learning Preferences/Education Level/Primary Language Learning Preference: Explanation Highest Education Level: College or Above Preferred Language: English Cognitive Barrier Language Barrier: No Translator Needed: No Memory Deficit: No Emotional Barrier: No Cultural/Religious Beliefs Affecting Medical Care: No Physical Barrier Impaired Vision: Yes Glasses Impaired Hearing: No Decreased Hand dexterity: No Knowledge/Comprehension Knowledge Level: Medium Comprehension Level: High Ability to understand written instructions: High Ability to understand verbal instructions: High Motivation Anxiety Level: Anxious Cooperation: Cooperative Education Importance: Acknowledges Need Interest in Health Problems: Asks Questions Perception: Coherent Willingness to Engage in Self-Management High Activities: Readiness to Engage in Self-Management High Activities: Electronic Signature(s) Signed: 05/07/2023 11:59:03 AM By: Yevonne Pax RN Entered By: Yevonne Pax on 04/27/2023 06:47:51 Holstad, [name] ([id number]) [id number] Page 3 of 5 -------------------------------------------------------------------------------- Fall Risk Assessment Details Patient Name: Date of Service: [name], [name] 04/27/2023 9:30 A  M Medical Record Number: [id number] Patient Account Number: [id number] Date of Birth/Sex: Treating RN: 1941/05/07 (82 y.o. [name]) Yevonne Pax Primary Care Noelly Lasseigne: Windle Guard Other Clinician: Referring Inocencio Roy: Treating Delawrence Fridman/Extender: Ashley Mariner in Treatment: 0 Fall Risk Assessment Items Have you had 2 or more falls in the last 12 monthso 0 No Have you had any fall that resulted in injury in the last 12 monthso 0 No FALLS RISK SCREEN History of falling - immediate or within 3 months 0 No Secondary diagnosis (Do you have 2 or more medical diagnoseso) 0 No Ambulatory aid None/bed  rest/wheelchair/nurse 0 Yes Crutches/cane/walker 0 No Furniture 0 No Intravenous therapy Access/Saline/Heparin Lock 0 No Gait/Transferring Normal/ bed rest/ wheelchair 0 Yes Weak (short steps with or without shuffle, stooped but able to lift head while walking, may seek 0 No support from furniture) Impaired (short steps with shuffle, may have difficulty arising from chair, head down, impaired 0 No balance) Mental Status Oriented to own ability 0 Yes Electronic Signature(s) Signed: 05/07/2023 11:59:03 AM By: Yevonne Pax RN Entered By: Yevonne Pax on 04/27/2023 06:48:26 -------------------------------------------------------------------------------- Foot Assessment Details Patient Name: Date of Service: [name] 04/27/2023 9:30 A M Medical Record Number: [id number] Patient Account Number: [id number] Date of Birth/Sex: Treating RN: 07-12-1941 (82 y.o. [name]) Yevonne Pax Primary Care [name]: Windle Guard Other Clinician: Referring Christyne Mccain: Treating Yecheskel Kurek/Extender: Ashley Mariner in Treatment: 0 Foot Assessment Items Site Locations [place], [place] L ([id number]) [id number] [id number] Page 4 of 5 + = Sensation present, - = Sensation absent, C = Callus, U = Ulcer R = Redness, W = Warmth, M = Maceration, PU = Pre-ulcerative  lesion F = Fissure, S = Swelling, D = Dryness Assessment Right: Left: Other Deformity: No No Prior Foot Ulcer: No No Prior Amputation: No No Charcot Joint: No No Ambulatory Status: Ambulatory Without Help Gait: Steady Electronic Signature(s) Signed: 05/07/2023 11:59:03 AM By: Yevonne Pax RN Entered By: Yevonne Pax on 04/27/2023 06:59:00 -------------------------------------------------------------------------------- Nutrition Risk Screening Details Patient Name: Date of Service: EARNESTINE, ZUKER RD L. 04/27/2023 9:30 A M Medical Record Number: [id number] Patient Account Number: [id number] Date of Birth/Sex: Treating RN: [birth year] (82 y.o. [name]) Yevonne Pax Primary Care Lamont Glasscock: Windle Guard Other Clinician: Referring Quency Tober: Treating Erendida Wrenn/Extender: Ashley Mariner in Treatment: 0 Height (in): 69 Weight (lbs): 220 Body Mass Index (BMI): 32.5 Nutrition Risk Screening Items Score Screening NUTRITION RISK SCREEN: I have an illness or condition that made me change the kind and/or amount of food I eat 0 No I eat fewer than two meals per day 0 No I eat few fruits and vegetables, or milk products 0 No I have three or more drinks of beer, liquor or wine almost every day 0 No I have tooth or mouth problems that make it hard for me to eat 0 No I don't always have enough money to buy the food I need 0 No [place], Eliazar L ([id number]) [id number] Page 5 of 5 I eat alone most of the time 0 No I take three or more different prescribed or over-the-counter drugs a day 1 Yes Without wanting to, I have lost or gained 10 pounds in the last six months 0 No I am not always physically able to shop, cook and/or feed myself 0 No Nutrition Protocols Good Risk Protocol 0 No interventions needed Moderate Risk Protocol High Risk Proctocol Risk Level: Good Risk Score: 1 Electronic Signature(s) Signed: 05/07/2023 11:59:03 AM By: Yevonne Pax  RN Entered By: Yevonne Pax on 04/27/2023 06:48:40

## 2023-05-07 NOTE — Progress Notes (Signed)
DANYON, BELT ([id number]) [id number] Page 1 of 10 Visit Report for 04/27/2023 Allergy List Details Patient Name: Date of Service: [name], James RD [name]. 04/27/2023 9:30 A M Medical Record Number: [id number] Patient Account Number: [id number] Date of Birth/Sex: Treating RN: 1941-01-06 (82 y.o. [name]) [name] Primary Care [name]: [name] James Clinician: Referring [name]: Treating [name]/Extender: [name] Allergies Active Allergies Jardiance Allergy Notes Electronic Signature(s) Signed: 05/07/2023 11:59:03 AM By: James Pax RN Entered By: [name] on 04/27/2023 06:45:12 -------------------------------------------------------------------------------- Arrival Information Details Patient Name: Date of Service: [name]. 04/27/2023 9:30 A M Medical Record Number: [id number] Patient Account Number: [id number] Date of Birth/Sex: Treating RN: 1941-05-23 (82 y.o. [name]) [name] Primary Care [name]: [name] [name]: Treating [name] in Treatment: [name] Visit Information Patient Arrived: Ambulatory Arrival Time: 09:38 Accompanied By: self Transfer Assistance: None Patient Identification Verified: Yes Secondary Verification Process Completed: Yes Patient Requires Transmission-Based Precautions: No Patient Has Alerts: No [name], [name] ([id number]) Electronic Signature(s) Signed: 05/07/2023 11:59:03 AM By: James Pax RN Entered By: [name] History Since Last Visit Added or deleted any medications: No Any new allergies or adverse reactions: No Had a fall or experienced change in activities of daily living that may affect risk of falls: No Signs or symptoms of abuse/neglect since last visito No Hospitalized since last visit: No Implantable device outside of the clinic excluding cellular tissue based products placed in  the center since last visit: No Has Dressing in Place as Prescribed: Yes Pain Present Now: Yes [phone].pdf Page 2 of 10 arrie on 04/27/2023 06:39:31 -------------------------------------------------------------------------------- Clinic Level of Care Assessment Details Patient Name: Date of Service: [name]. 04/27/2023 9:30 A M Medical Record Number: [id number] Patient Account Number: [id number] Date of Birth/Sex: Treating RN: 17-Aug-1941 (82 y.o. [name]) [name] Primary Care [name]: [name] James Clinician: Referring [name]: Treating [name] in Treatment: [name] Clinic Level of Care Assessment Items TOOL 1 Quantity Score X- 1 [name] Use when EandM and Procedure is performed on INITIAL visit ASSESSMENTS - Nursing Assessment / Reassessment X- 1 20 General Physical Exam (combine w/ comprehensive assessment (listed just below) when performed on new pt. evals) X- 1 25 Comprehensive Assessment (HX, ROS, Risk Assessments, Wounds Hx, etc.) ASSESSMENTS - Wound and Skin Assessment / Reassessment []  - [name] Dermatologic / Skin Assessment (not related to wound area) ASSESSMENTS - Ostomy and/or Continence Assessment and Care []  - [name] Incontinence Assessment and Management []  - [name] Ostomy Care Assessment and Management (repouching, etc.) PROCESS - Coordination of Care X - Simple Patient / Family Education for ongoing care 1 15 []  - [name] Complex (extensive) Patient / Family Education for ongoing care []  - [name] Staff obtains Chiropractor, Records, T Results / Process Orders est []  - [name] Staff telephones HHA, Nursing Homes / Clarify orders / etc []  - [name] Routine Transfer to another Facility (non-emergent condition) []  - [name] Routine Hospital Admission (non-emergent condition) X- 1 15 New Admissions / Manufacturing engineer / Ordering NPWT Apligraf, etc. , []  - [name] Emergency Hospital Admission (emergent condition) PROCESS - Special  Needs []  - [name] Pediatric / Minor Patient Management []  - [name] Isolation Patient Management []  - [name] Hearing / Language / Visual special needs []  - [name] Assessment of Community assistance (transportation, D/C planning, etc.) []  - [name] Additional assistance / Altered mentation []  - [name] Support Surface(s) Assessment (bed, cushion, seat, etc.)  INTERVENTIONS - Miscellaneous []  - [name] External ear exam []  - [name] Patient Transfer (multiple staff / Teddy Spike / Similar devices) [name], [name] ([id number]) [phone].pdf Page 3 of 10 []  - [name] Simple Staple / Suture removal (25 or less) []  - [name] Complex Staple / Suture removal (26 or more) []  - [name] Hypo/Hyperglycemic Management (do not check if billed separately) X- 1 15 Ankle / Brachial Index (ABI) - do not check if billed separately Has the patient been seen at the hospital within the last three years: Yes Total Score: 90 Level Of Care: New/Established - Level 3 Electronic Signature(s) Signed: 05/07/2023 11:59:03 AM By: James Pax RN Entered By: [name] on 04/27/2023 07:46:40 -------------------------------------------------------------------------------- Compression Therapy Details Patient Name: Date of Service: [name]. 04/27/2023 9:30 A M Medical Record Number: [id number] Patient Account Number: [id number] Date of Birth/Sex: Treating RN: [birth year] (82 y.o. [name]) [name] Primary Care [name]: [name] James Clinician: Referring [name]: Treating [name] in Treatment: [name] Compression Therapy Performed for Wound Assessment: Wound #6 Left,Posterior Lower Leg Performed By: Clinician James Pax, RN Compression Type: Double Layer Post Procedure Diagnosis Same as Pre-procedure Electronic Signature(s) Signed: 05/07/2023 11:59:03 AM By: James Pax RN Entered By: [name] on 04/27/2023  07:46:08 -------------------------------------------------------------------------------- Encounter Discharge Information Details Patient Name: Date of Service: [name]. 04/27/2023 9:30 A M Medical Record Number: [id number] Patient Account Number: [id number] Date of Birth/Sex: Treating RN: [birth year] (82 y.o. [name]) [name] Primary Care Kenndra Morris: [name] James Clinician: Referring Piccola Arico: Treating Dorcas Melito/Extender: [name] Encounter Discharge Information Items Discharge Condition: Stable Ambulatory Status: Ambulatory Discharge Destination: Home [name], [name] ([id number]) [id number] Page 4 of 10 Transportation: Private Auto Accompanied By: self Schedule Follow-up Appointment: Yes Clinical Summary of Care: Electronic Signature(s) Signed: 05/07/2023 11:59:03 AM By: James Pax RN Entered By: [name] on 04/27/2023 07:47:19 -------------------------------------------------------------------------------- Lower Extremity Assessment Details Patient Name: Date of Service: [name], James RD [name]. 04/27/2023 9:30 A M Medical Record Number: [id number] Patient Account Number: [id number] Date of Birth/Sex: Treating RN: [birth year] (82 y.o. [name]) [name] Primary Care Jamille Yoshino: [name] James Clinician: Referring Niaya Hickok: Treating Emmalyn Hinson/Extender: [name] Edema Assessment Assessed: [Left: No] [Right: No] Edema: [Left: Ye] [Right: s] Calf Left: Right: Point of Measurement: 38 cm From Medial Instep 35 cm Ankle Left: Right: Point of Measurement: 12 cm From Medial Instep 22.5 cm Knee To Floor Left: Right: From Medial Instep 48 cm Vascular Assessment Pulses: Dorsalis Pedis Palpable: [Left:Yes] Doppler Audible: [Left:Yes] Extremity colors, hair growth, and conditions: Extremity Color: [Left:Hyperpigmented] Hair Growth on Extremity:  [Left:No] Temperature of Extremity: [Left:Warm] Capillary Refill: [Left:< 3 seconds] Dependent Rubor: [Left:No] Blanched when Elevated: [Left:No] Lipodermatosclerosis: [Left:No] Blood Pressure: Brachial: [Left:118] Ankle: [Left:Dorsalis Pedis: 110 [name].93] Toe Nail Assessment Left: Right: Thick: No Discolored: No Deformed: No Swingle, Tannen [name] ([id number]) [phone].pdf Page 5 of 10 Improper Length and Hygiene: No Electronic Signature(s) Signed: 05/07/2023 11:59:03 AM By: James Pax RN Entered By: [name] on 04/27/2023 07:42:20 -------------------------------------------------------------------------------- Multi Wound Chart Details Patient Name: Date of Service: [name]. 04/27/2023 9:30 A M Medical Record Number: [id number] Patient Account Number: [id number] Date of Birth/Sex: Treating RN: 1941/01/04 (82 y.o. [name]) [name] Primary Care Armari Fussell: [name] James Clinician: Referring Jadelyn Elks: Treating Davie Claud/Extender: [name] Vital Signs Height(in): 69 Pulse(bpm): 77 Weight(lbs): 220 Blood Pressure(mmHg): 118/69 Body Mass Index(BMI): 32.5 Temperature(F): 98.2  Respiratory Rate(breaths/min): 18 [6:Photos:] [N/A:N/A] Left, Posterior Lower Leg N/A N/A Wound Location: Trauma N/A N/A Wounding Event: Venous Leg Ulcer N/A N/A Primary Etiology: Cataracts, Glaucoma, Arrhythmia, N/A N/A Comorbid History: Coronary Artery Disease, Hypertension, Myocardial Infarction, Peripheral Arterial Disease, Peripheral Venous Disease, Gout, Osteoarthritis 03/08/2023 N/A N/A Date Acquired: [name] N/A N/A Weeks of Treatment: Open N/A N/A Wound Status: No N/A N/A Wound Recurrence: 2x1.2x0.1 N/A N/A Measurements [name] x W x D (cm) 1.885 N/A N/A A (cm) : rea [name].188 N/A N/A Volume (cm) : Full Thickness Without Exposed N/A N/A Classification: Support Structures Medium N/A N/A Exudate Amount: Serosanguineous  N/A N/A Exudate Type: red, brown N/A N/A Exudate Color: Large (67-100%) N/A N/A Granulation Amount: Red, Pink, Pale N/A N/A Granulation Quality: Small (1-33%) N/A N/A Necrotic Amount: Fat Layer (Subcutaneous Tissue): Yes N/A N/A Exposed Structures: Fascia: No Tendon: No Muscle: No Joint: No Bone: No None N/A N/A Epithelialization: [name], [name] ([id number]) [id number] Page 6 of 10 Treatment Notes Electronic Signature(s) Signed: 05/07/2023 11:59:03 AM By: James Pax RN Entered By: [name] on 04/27/2023 07:08:33 -------------------------------------------------------------------------------- Multi-Disciplinary Care Plan Details Patient Name: Date of Service: [name]. 04/27/2023 9:30 A M Medical Record Number: [id number] Patient Account Number: [id number] Date of Birth/Sex: Treating RN: September 30, 1940 (82 y.o. [name]) [name] Primary Care Shakeena Kafer: [name] James Clinician: Referring Bohden Dung: Treating Orin Eberwein/Extender: [name] Active Inactive Necrotic Tissue Nursing Diagnoses: Knowledge deficit related to management of necrotic/devitalized tissue Goals: Patient/caregiver will verbalize understanding of reason and process for debridement of necrotic tissue Date Initiated: 04/27/2023 Target Resolution Date: 05/28/2023 Goal Status: Active Interventions: Assess patient pain level pre-, during and post procedure and prior to discharge Notes: Wound/Skin Impairment Nursing Diagnoses: Knowledge deficit related to ulceration/compromised skin integrity Goals: Patient/caregiver will verbalize understanding of skin care regimen Date Initiated: 04/27/2023 Target Resolution Date: 05/28/2023 Goal Status: Active Ulcer/skin breakdown will have a volume reduction of 30% by week 4 Date Initiated: 04/27/2023 Target Resolution Date: 05/28/2023 Goal Status: Active Ulcer/skin breakdown will have a volume  reduction of 50% by week 8 Date Initiated: 04/27/2023 Target Resolution Date: 06/27/2023 Goal Status: Active Ulcer/skin breakdown will have a volume reduction of 80% by week 12 Date Initiated: 04/27/2023 Target Resolution Date: 07/28/2023 Goal Status: Active Ulcer/skin breakdown will heal within 14 weeks Date Initiated: 04/27/2023 Target Resolution Date: 08/27/2023 Goal Status: Active Interventions: Assess patient/caregiver ability to obtain necessary supplies Assess patient/caregiver ability to perform ulcer/skin care regimen upon admission and as needed Assess ulceration(s) every visit [name], [name] ([id number]) [phone].pdf Page 7 of 10 Notes: Electronic Signature(s) Signed: 05/07/2023 11:59:03 AM By: James Pax RN Entered By: [name] on 04/27/2023 07:10:05 -------------------------------------------------------------------------------- Pain Assessment Details Patient Name: Date of Service: [name], James RD [name]. 04/27/2023 9:30 A M Medical Record Number: [id number] Patient Account Number: [id number] Date of Birth/Sex: Treating RN: July 02, 1941 (82 y.o. [name]) [name] Primary Care Bruna Dills: [name] James Clinician: Referring Aynslee Mulhall: Treating Spence Soberano/Extender: [name] Active Problems Location of Pain Severity and Description of Pain Patient Has Paino Yes Site Locations With Dressing Change: Yes Duration of the Pain. Constant / Intermittento Intermittent How Long Does it Lasto Hours: Minutes: 10 Rate the pain. Current Pain Level: 7 Worst Pain Level: 10 Least Pain Level: [name] Character of Pain Describe the Pain: Burning Pain Management and Medication Current Pain Management: Medication: No Cold Application: No Rest: Yes Massage: No Activity: No T.E.N.S.: No Heat Application: No Leg drop or  elevation: No Is the Current Pain Management Adequate: Inadequate How does your wound impact your  activities of daily livingo Sleep: No Bathing: No Appetite: No Relationship With Others: No Bladder Continence: No Emotions: No Bowel Continence: No Work: No Toileting: No Drive: No Dressing: No Hobbies: No Electronic Signature(s) Signed: 05/07/2023 11:59:03 AM By: James Pax RN [name], [name] ([id number]) [id number].pdf Page 8 of 10 Entered By: [name] on 04/27/2023 06:40:59 -------------------------------------------------------------------------------- Patient/Caregiver Education Details Patient Name: Date of Service: [name], [name] 8/20/2024andnbsp9:30 A M Medical Record Number: [id number] Patient Account Number: [id number] Date of Birth/Gender: Treating RN: 08/30/1941 (82 y.o. [name]) [name] Primary Care Physician: [name] James Clinician: Referring Physician: Treating Physician/Extender: [name] Education Assessment Education Provided To: Patient Education Topics Provided Welcome [facility]-New Patient Packet: o Handouts: Welcome [facility] o Methods: Explain/Verbal Responses: State content correctly Electronic Signature(s) Signed: 05/07/2023 11:59:03 AM By: James Pax RN Entered By: [name] on 04/27/2023 07:10:23 -------------------------------------------------------------------------------- Wound Assessment Details Patient Name: Date of Service: [name]. 04/27/2023 9:30 A M Medical Record Number: [id number] Patient Account Number: [id number] Date of Birth/Sex: Treating RN: 11-12-1940 (82 y.o. [name]) [name] Primary Care Shada Nienaber: [name] James Clinician: Referring Riko Lumsden: Treating Modine Oppenheimer/Extender: [name] Wound Status Wound Number: 6 Primary Venous Leg Ulcer Etiology: Wound Location: Left, Posterior Lower Leg Wound Open Wounding Event: Trauma Status: Date Acquired:  03/08/2023 Comorbid Cataracts, Glaucoma, Arrhythmia, Coronary Artery Disease, Weeks Of Treatment: [name] History: Hypertension, Myocardial Infarction, Peripheral Arterial Disease, Clustered Wound: No Peripheral Venous Disease, Gout, Osteoarthritis Photos [name], [name] ([id number]) [phone].pdf Page 9 of 10 Wound Measurements Length: (cm) 2 Width: (cm) 1.2 Depth: (cm) [name].1 Area: (cm) 1.885 Volume: (cm) [name].188 % Reduction in Area: % Reduction in Volume: Epithelialization: None Tunneling: No Undermining: No Wound Description Classification: Full Thickness Without Exposed Suppor Exudate Amount: Medium Exudate Type: Serosanguineous Exudate Color: red, brown t Structures Foul Odor After Cleansing: No Slough/Fibrino Yes Wound Bed Granulation Amount: Large (67-100%) Exposed Structure Granulation Quality: Red, Pink, Pale Fascia Exposed: No Necrotic Amount: Small (1-33%) Fat Layer (Subcutaneous Tissue) Exposed: Yes Necrotic Quality: Adherent Slough Tendon Exposed: No Muscle Exposed: No Joint Exposed: No Bone Exposed: No Electronic Signature(s) Signed: 05/07/2023 11:59:03 AM By: James Pax RN Entered By: [name] on 04/27/2023 07:05:05 -------------------------------------------------------------------------------- Vitals Details Patient Name: Date of Service: [name]. 04/27/2023 9:30 A M Medical Record Number: [id number] Patient Account Number: [id number] Date of Birth/Sex: Treating RN: 24-Aug-1941 (82 y.o. [name]) [name] Primary Care Brytney Somes: [name] James Clinician: Referring Pershing Skidmore: Treating Storie Heffern/Extender: [name] Vital Signs Time Taken: 09:44 Temperature (F): 98.2 Height (in): 69 Pulse (bpm): 77 Source: Stated Respiratory Rate (breaths/min): 18 Weight (lbs): 220 Blood Pressure (mmHg): 118/69 Source: Stated Reference Range: 80 - 120 mg / dl Body Mass Index (BMI): 32.5 Electronic  Signature(s) Signed: 05/07/2023 11:59:03 AM By: James Pax RN Mom, [name] ([id number]) [phone].pdf Page 10 of 10 Signed: 05/07/2023 11:59:03 AM By: James Pax RN Entered By: [name] on 04/27/2023 06:44:29

## 2023-05-07 NOTE — Progress Notes (Signed)
[name] ([id number]) [id number] Page 1 of 10 Visit Report for 04/27/2023 Chief Complaint Document Details Patient Name: Date of Service: [name], James RD L. 04/27/2023 9:30 A M Medical Record Number: [id number] Patient Account Number: [id number] Date of Birth/Sex: Treating RN: 11-22-1940 (82 y.o. [name]) Yevonne Pax Primary Care Provider: Windle [name] Other Clinician: Referring Provider: Treating Provider/Extender: [name] in Treatment: 0 Information Obtained from: Patient Chief Complaint Left LE Ulcer Electronic Signature(s) Signed: 04/27/2023 10:36:40 AM By: James Derry PA-C Entered By: [name] on 04/27/2023 07:36:40 -------------------------------------------------------------------------------- HPI Details Patient Name: Date of Service: James Kil RD L. 04/27/2023 9:30 A M Medical Record Number: [id number] Patient Account Number: [id number] Date of Birth/Sex: Treating RN: 1940/12/25 (82 y.o. [name] Primary Care Provider: Windle [name] Other Clinician: Referring Provider: Treating Provider/Extender: [name] in Treatment: 0 History of Present Illness HPI Description: 03/18/17 on evaluation today patient presents for initial visit concerning an ulcer he has on the left lateral lower extremity which has been present since mid February 2018. Unfortunately due to other family circumstances evaluation and treatment of this wound has been put on hold until this point when they could finally get him into be seen. He does have high blood pressure but has no history of diabetes though he does tell me that his cardiologist had been monitoring him for hyperglycemia and he is "borderline". He does have some discomfort in regard to this wound but fortunately this is not severe and typically with cleansing. His main concern is why this wound is not healing as in the past he has never had any difficulty  with healing. The initial injury was during the period of time where he was helping a friend with some work and scrape the leg on a brick during that process. It has just never healed since that time. Patient is on chronic anticoagulant therapy and has not had any cardiovascular procedures such as arterial or venous imaging. 03/25/2017 - I'm seeing the patient for the first time today and notes that he has had a injury to the left lower extremity since mid February and from what I understand a hematoma opened out into a lacerated wound and this has been slow to heal. He has had no history of previous arterial or venous problems and has had some cardiac history with stents placed. He is not a diabetic and not a smoker. During his previous visit there has been a arterial and venous duplex study ordered and this is pending 04/01/2017 -- arterial and venous duplex studies still pending 04/08/2017 -- lower extremity venous duplex reflux evaluation done on 04/02/2017 showed no evidence of venous incompetence in both left and right great saphenous veins. lower extremity arterial duplex evaluation done on 04/06/2017 -- no evidence of hemodynamically significant arterial occlusive disease bilaterally. the right ABI [name], James L ([id number]) [phone].pdf Page 2 of 10 was 1.19 with a toe pressure of 0.83 and the left ABI was 1.25 with a toe pressures of 0.78 and triphasic flow. these were all normal. 04/15/17 on evaluation today patient appears to be doing fairly well in regard to his right lower extremity wound. He has been tolerating the dressing without complication. His biggest thing is that he states he was "hoping that today would be the last visit and he will not have to come back". Nonetheless he still has an open wound noted and I explained to him that we do want to see him back to obviously work  toward getting this to close appropriately. Fortunately we did have the results  as noted above of his arterial duplex study and venous studies which appear to be doing very well. Overall I do believe he is progressing nicely. No fevers, chills, nausea, or vomiting noted at this time. 04/22/17 on evaluation today patient's left lower from the wound appears to be doing about the same as last week's evaluation. He also has a superficial skin tear in the lateral periwound that I believe is due to the current dressing that is the [facility] Dressing actually sticking to his skin. I think this could be potentially damaging the wound bed as well. With that being said he is not having any significant pain. No fevers, chills, nausea, or vomiting noted at this time. Readmission: 02/26/2020 patient presents today for reevaluation here clinically has not been seen since August 2018. With that being said he tells me that about a week ago he did sustain an injury to his leg during a fall. Fortunately there is no signs of active infection at this time. No fevers, chills, nausea, vomiting, or diarrhea. He tells me that he has "not been putting antibiotic ointment on it because that will not help it heal". Mainly they have just been wrapping this with nothing on it and cleaning with saline in between. Fortunately there is no signs of obvious infection although I am concerned about that possibility with the drainage she is having I think it may be more due to swelling than anything. He does have a wound on the upper arm as well on the right but this appears to be almost completely healed I am not really concerned about this I think just a protective bandage is all we really need here. 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 03/04/2020 upon evaluation today patient actually appears to be doing quite well with regard to his wounds he is tolerating the dressing changes  without complication and overall he is going require some sharp debridement today but this is minimal and for the most part his wounds are healed. Overall I think he is progressing quite nicely. Readmission: 04-27-2023 upon evaluation today patient presents for reevaluation here in the clinic although it has been a number of years since have seen him in fact it looks to be 3 years and a little bit more. Nonetheless at this point today he does have an issue that is going on with a wound on the left posterior lower leg this does not appear to be too significant compared to what we previously dealt with. I think this is something looks to be a possibility of healing quite rapidly which is good news. Fortunately however I do not see any signs of infection at the moment which is very good news. He has been compression socks that he does wear. Patient has a history of lymphedema, chronic venous insufficiency, hypertension, long-term use of anticoagulant therapy, he has a cardiac pacemaker, atrial fibrillation, coronary artery disease, chronic kidney disease, and congestive heart failure. Electronic Signature(s) Signed: 04/27/2023 6:33:45 PM By: James Derry PA-C Entered By: [name] on 04/27/2023 15:33:45 -------------------------------------------------------------------------------- Physical Exam Details Patient Name: Date of Service: [name], James RD L. 04/27/2023 9:30 A M Medical Record Number: [id number] Patient Account Number: [id number] Date of Birth/Sex: Treating RN: 13-Oct-1940 (82 y.o. [name] Primary Care Provider: Windle [name] Other Clinician: Referring Provider: Treating Provider/Extender:  Stone, Sharma Covert, Wilson Weeks in Treatment: 0 Constitutional sitting or standing blood pressure is within target range for patient.. pulse regular and within target range for patient.[name] respirations regular, non-labored and within target range for patient.[name] temperature within target range  for patient.. Well-nourished and well-hydrated in no acute distress. Eyes conjunctiva clear no eyelid edema noted. pupils equal round and reactive to light and accommodation. Ears, Nose, Mouth, and Throat no gross abnormality of ear auricles or external auditory canals. normal hearing noted during conversation. mucus membranes moist. Respiratory normal breathing without difficulty. Cardiovascular 2+ dorsalis pedis/posterior tibialis pulses. 1+ pitting edema of the bilateral lower extremities. Musculoskeletal normal gait and posture. no significant deformity or arthritic changes, no loss or range of motion, no clubbing. [name], [name] ([id number]) [id number] Page 3 of 10 Psychiatric this patient is able to make decisions and demonstrates good insight into disease process. Alert and Oriented x 3. pleasant and cooperative. Notes Upon inspection patient's wound actually on his leg spontaneously occurred and he tells me at this point that it is really not hurting or causing any trouble except for when I was cleaning it the good news is I did not have to perform any sharp debridement I was able to clean this with saline gauze and this seems to be doing significantly better. I do believe he would benefit from compression wraps. Electronic Signature(s) Signed: 04/27/2023 6:34:12 PM By: James Derry PA-C Entered By: [name] on 04/27/2023 15:34:12 -------------------------------------------------------------------------------- Physician Orders Details Patient Name: Date of Service: James Kil RD L. 04/27/2023 9:30 A M Medical Record Number: [id number] Patient Account Number: [id number] Date of Birth/Sex: Treating RN: 1940-11-05 (82 y.o. 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 Bathing/ Shower/ Hygiene May shower with wound dressing protected with water repellent cover or cast protector. Anesthetic (Use 'Patient Medications' Section for Anesthetic Order Entry) Lidocaine applied to wound bed Edema Control - Lymphedema / Segmental Compressive Device / Other Elevate, Exercise Daily and A void Standing for Long Periods of Time. Elevate legs to the level of the heart and pump ankles as often as possible Elevate leg(s) parallel to the floor when sitting. Wound Treatment Wound #6 - Lower Leg Wound Laterality: Left, Posterior Cleanser: Wound Cleanser 1 x Per Week Discharge Instructions: Wash your hands with soap and water. Remove old dressing, discard into plastic bag and place into trash. Cleanse the wound with Wound Cleanser prior to applying a clean dressing using gauze sponges, not tissues or cotton balls. Do not scrub or use excessive force. Pat dry using gauze sponges, not tissue or cotton balls. Prim Dressing: Silvercel Small 2x2 (in/in) ary 1 x Per Week Tobin, Jvion L ([id number]) [phone].pdf Page 4 of 10 Discharge Instructions: Apply Silvercel Small 2x2 (in/in) as instructed Secondary Dressing: Gauze 1 x Per Week Discharge Instructions: As directed: dry, moistened with  saline or  moistened with Dakins Solution Compression Wrap: Urgo K2 Lite, two layer compression system, regular 1 x Per Week Patient Medications llergies: Jardiance A Notifications Medication Indication Start End 04/28/2023 doxycycline hyclate DOSE 1 - oral 100 mg capsule - 1 capsule oral twice a day x 10 days Electronic Signature(s) Signed: 04/28/2023 11:19:54 AM By: James Derry PA-C Previous Signature: 04/27/2023 6:45:54 PM Version By: James Derry PA-C Entered By: [name] on 04/28/2023 08:19:54 -------------------------------------------------------------------------------- Problem List Details Patient Name: Date of Service: James Kil RD L. 04/27/2023 9:30 A M Medical Record Number: [id number] Patient Account Number: [id number] Date of Birth/Sex: Treating RN: [birth year] (82 y.o. [name]) Yevonne Pax Primary Care Provider: Windle [name] Other Clinician: Referring Provider: Treating Provider/Extender: [name] in Treatment: 0 Active Problems ICD-10 Encounter Code Description Active Date MDM Diagnosis I89.0 Lymphedema, not elsewhere classified 04/27/2023 No Yes I87.332 Chronic venous hypertension (idiopathic) with ulcer and inflammation of left 04/27/2023 No Yes lower extremity L97.822 Non-pressure chronic ulcer of other part of left lower leg with fat layer exposed8/20/2024 No Yes I10 Essential (primary) hypertension 04/27/2023 No Yes Z79.01 Long term (current) use of anticoagulants 04/27/2023 No Yes Z95.0 Presence of cardiac pacemaker 04/27/2023 No Yes Handley, Saintclair L ([id number]) [id number] Page 5 of 10 I25.10 Atherosclerotic heart disease of native coronary artery without angina pectoris 04/27/2023 No Yes N18.9 Chronic kidney disease, unspecified 04/27/2023 No Yes I50.42 Chronic combined systolic (congestive) and diastolic (congestive) heart failure 04/27/2023 No Yes I48.0 Paroxysmal atrial fibrillation 04/27/2023 No Yes Inactive  Problems Resolved Problems Electronic Signature(s) Signed: 04/27/2023 10:36:24 AM By: James Derry PA-C Entered By: [name] on 04/27/2023 07:36:23 -------------------------------------------------------------------------------- Progress Note Details Patient Name: Date of Service: James Kil RD L. 04/27/2023 9:30 A M Medical Record Number: [id number] Patient Account Number: [id number] Date of Birth/Sex: Treating RN: September 01, 1941 (82 y.o. [name]) Yevonne Pax Primary Care Provider: Windle [name] Other Clinician: Referring Provider: Treating Provider/Extender: [name] in Treatment: 0 Subjective Chief Complaint Information obtained from Patient Left LE Ulcer History of Present Illness (HPI) 03/18/17 on evaluation today patient presents for initial visit concerning an ulcer he has on the left lateral lower extremity which has been present since mid February 2018. Unfortunately due to other family circumstances evaluation and treatment of this wound has been put on hold until this point when they could finally get him into be seen. He does have high blood pressure but has no history of diabetes though he does tell me that his cardiologist had been monitoring him for hyperglycemia and he is "borderline". He does have some discomfort in regard to this wound but fortunately this is not severe and typically with cleansing. His main concern is why this wound is not healing as in the past he has never had any difficulty with healing. The initial injury was during the period of time where he was helping a friend with some work and scrape the leg on a brick during that process. It has just never healed since that time. Patient is on chronic anticoagulant therapy and has not had any cardiovascular procedures such as arterial or venous imaging. 03/25/2017 - I'm seeing the patient for the first time today and notes that he has had a injury to the left lower extremity since mid February  and from what I understand a hematoma opened out into a lacerated wound and this has been slow to heal. He has had no history of previous arterial or venous problems and has had some cardiac history with stents  placed. He is not a diabetic and not a smoker. During his previous visit there has been a arterial and venous duplex study ordered and this is pending 04/01/2017 -- arterial and venous duplex studies still pending 04/08/2017 -- lower extremity venous duplex reflux evaluation done on 04/02/2017 showed no evidence of venous incompetence in both left and right great saphenous veins. lower extremity arterial duplex evaluation done on 04/06/2017 -- no evidence of hemodynamically significant arterial occlusive disease bilaterally. the right ABI was 1.19 with a toe pressure of 0.83 and the left ABI was 1.25 with a toe pressures of 0.78 and triphasic flow. these were all normal. 04/15/17 on evaluation today patient appears to be doing fairly well in regard to his right lower extremity wound. He has been tolerating the dressing without complication. His biggest thing is that he states he was "hoping that today would be the last visit and he will not have to come back". Nonetheless he still has an open wound noted and I explained to him that we do want to see him back to obviously work toward getting this to close appropriately. Fortunately we did have the results as noted above of his arterial duplex study and venous studies which appear to be doing very well. Overall I do believe he is progressing nicely. No fevers, chills, nausea, or vomiting noted at this time. 04/22/17 on evaluation today patient's left lower from the wound appears to be doing about the same as last week's evaluation. He also has a superficial skin [name], [name] ([id number]) [phone].pdf Page 6 of 10 tear in the lateral periwound that I believe is due to the current dressing that is the [facility]  Dressing actually sticking to his skin. I think this could be potentially damaging the wound bed as well. With that being said he is not having any significant pain. No fevers, chills, nausea, or vomiting noted at this time. Readmission: 02/26/2020 patient presents today for reevaluation here clinically has not been seen since August 2018. With that being said he tells me that about a week ago he did sustain an injury to his leg during a fall. Fortunately there is no signs of active infection at this time. No fevers, chills, nausea, vomiting, or diarrhea. He tells me that he has "not been putting antibiotic ointment on it because that will not help it heal". Mainly they have just been wrapping this with nothing on it and cleaning with saline in between. Fortunately there is no signs of obvious infection although I am concerned about that possibility with the drainage she is having I think it may be more due to swelling than anything. He does have a wound on the upper arm as well on the right but this appears to be almost completely healed I am not really concerned about this I think just a protective bandage is all we really need here. 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 03/04/2020 upon evaluation today patient actually appears to be doing quite well with regard to his wounds he is tolerating the dressing changes without complication and overall he is going require some sharp debridement today but this is minimal and for the most part his wounds are healed. Overall I think he is progressing quite nicely. Readmission: 04-27-2023 upon evaluation today patient presents for reevaluation here in the clinic although it has been a number of years since have  seen him in fact it looks to be 3 years and a little bit more. Nonetheless at this point today he does have an issue that is  going on with a wound on the left posterior lower leg this does not appear to be too significant compared to what we previously dealt with. I think this is something looks to be a possibility of healing quite rapidly which is good news. Fortunately however I do not see any signs of infection at the moment which is very good news. He has been compression socks that he does wear. Patient has a history of lymphedema, chronic venous insufficiency, hypertension, long-term use of anticoagulant therapy, he has a cardiac pacemaker, atrial fibrillation, coronary artery disease, chronic kidney disease, and congestive heart failure. Patient History Information obtained from Patient. Allergies Jardiance Family History Heart Disease - Father,Siblings, Hypertension - Siblings, No family history of Cancer, Diabetes, Hereditary Spherocytosis, Kidney Disease, Lung Disease, Seizures, Stroke, Thyroid Problems, Tuberculosis. Social History Never smoker, Marital Status - Widowed, Alcohol Use - Never, Drug Use - No History, Caffeine Use - Rarely. Medical History Eyes Patient has history of Cataracts - bilateral removal, Glaucoma Denies history of Optic Neuritis Ear/Nose/Mouth/Throat Denies history of Chronic sinus problems/congestion, Middle ear problems Hematologic/Lymphatic Denies history of Anemia, Hemophilia, Human Immunodeficiency Virus, Lymphedema, Sickle Cell Disease Respiratory Denies history of Aspiration, Asthma, Chronic Obstructive Pulmonary Disease (COPD), Pneumothorax, Sleep Apnea, Tuberculosis Cardiovascular Patient has history of Arrhythmia - a fib, Coronary Artery Disease, Hypertension, Myocardial Infarction - 2000, Peripheral Arterial Disease, Peripheral Venous Disease Gastrointestinal Denies history of Cirrhosis , Colitis, Crohns, Hepatitis A, Hepatitis B, Hepatitis C Endocrine Denies history of Type I Diabetes, Type II Diabetes Genitourinary Denies history of End Stage Renal  Disease Immunological Denies history of Lupus Erythematosus, Raynauds, Scleroderma Integumentary (Skin) Denies history of History of Burn, History of pressure wounds Musculoskeletal Patient has history of Gout, Osteoarthritis Denies history of Rheumatoid Arthritis, Osteomyelitis Neurologic Denies history of Dementia, Neuropathy, Quadriplegia, Paraplegia, Seizure Disorder Oncologic Denies history of Received Chemotherapy, Received Radiation Psychiatric Denies history of Anorexia/bulimia, Confinement Anxiety Medical A Surgical History Notes nd Ear/Nose/Mouth/Throat Hard of Hearing Objective [name], [name] ([id number]) [id number] Page 7 of 10 Constitutional sitting or standing blood pressure is within target range for patient.. pulse regular and within target range for patient.[name] respirations regular, non-labored and within target range for patient.[name] temperature within target range for patient.. Well-nourished and well-hydrated in no acute distress. Vitals Time Taken: 9:44 AM, Height: 69 in, Source: Stated, Weight: 220 lbs, Source: Stated, BMI: 32.5, Temperature: 98.2 F, Pulse: 77 bpm, Respiratory Rate: 18 breaths/min, Blood Pressure: 118/69 mmHg. Eyes conjunctiva clear no eyelid edema noted. pupils equal round and reactive to light and accommodation. Ears, Nose, Mouth, and Throat no gross abnormality of ear auricles or external auditory canals. normal hearing noted during conversation. mucus membranes moist. Respiratory normal breathing without difficulty. Cardiovascular 2+ dorsalis pedis/posterior tibialis pulses. 1+ pitting edema of the bilateral lower extremities. Musculoskeletal normal gait and posture. no significant deformity or arthritic changes, no loss or range of motion, no clubbing. Psychiatric this patient is able to make decisions and demonstrates good insight into disease process. Alert and Oriented x 3. pleasant and cooperative. General  Notes: Upon inspection patient's wound actually on his leg spontaneously occurred and he tells me at this point that it is really not hurting or causing any trouble except for when I was cleaning it the good news is I did not have to perform any sharp debridement I was  able to clean this with saline gauze and this seems to be doing significantly better. I do believe he would benefit from compression wraps. Integumentary (Hair, Skin) Wound #6 status is Open. Original cause of wound was Trauma. The date acquired was: 03/08/2023. The wound is located on the Left,Posterior Lower Leg. The wound measures 2cm length x 1.2cm width x 0.1cm depth; 1.885cm^2 area and 0.188cm^3 volume. There is Fat Layer (Subcutaneous Tissue) exposed. There is no tunneling or undermining noted. There is a medium amount of serosanguineous drainage noted. There is large (67-100%) red, pink, pale granulation within the wound bed. There is a small (1-33%) amount of necrotic tissue within the wound bed including Adherent Slough. 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 There was a Double Layer Compression Therapy Procedure by Yevonne Pax, RN. Post procedure Diagnosis Wound #6: Same as Pre-Procedure Plan Follow-up Appointments: Return Appointment in 1 week. Bathing/ Shower/  Hygiene: May shower with wound dressing protected with water repellent cover or cast protector. Anesthetic (Use 'Patient Medications' Section for Anesthetic Order Entry): Lidocaine applied to wound bed Edema Control - Lymphedema / Segmental Compressive Device / Other: Elevate, Exercise Daily and Avoid Standing for Long Periods of Time. Elevate legs to the level of the heart and pump ankles as often as possible Elevate leg(s) parallel to the floor when sitting. WOUND #6: - Lower Leg Wound Laterality: Left, Posterior Cleanser: Wound Cleanser 1 x Per Week/ [name], James L ([id number]) [id number].pdf Page 8 of 10 Discharge Instructions: Wash your hands with soap and water. Remove old dressing, discard into plastic bag and place into trash. Cleanse the wound with Wound Cleanser prior to applying a clean dressing using gauze sponges, not tissues or cotton balls. Do not scrub or use excessive force. Pat dry using gauze sponges, not tissue or cotton balls. Prim Dressing: Silvercel Small 2x2 (in/in) 1 x Per Week/ ary Discharge Instructions: Apply Silvercel Small 2x2 (in/in) as instructed Secondary Dressing: Gauze 1 x Per Week/ Discharge Instructions: As directed: dry, moistened with saline or moistened with Dakins Solution Com pression Wrap: Urgo K2 Lite, two layer compression system, regular 1 x Per Week/ 1. I would recommend that we have the patient continue to monitor for any signs of infection or worsening. Based on what I am seeing we will get initiate treatment here with silver alginate dressing and an Urgo K2 lite compression wrap. 2. I would recommend as well the patient should continue with the elevation. Once he is healed I think compression socks ongoing again be recommended to continue. We will see patient back for reevaluation in 1 week here in the clinic. If anything worsens or changes patient will contact our office for additional recommendations. Electronic  Signature(s) Signed: 04/27/2023 6:34:39 PM By: James Derry PA-C Entered By: [name] on 04/27/2023 15:34:39 -------------------------------------------------------------------------------- ROS/PFSH Details Patient Name: Date of Service: James Kil RD L. 04/27/2023 9:30 A M Medical Record Number: [id number] Patient Account Number: [id number] Date of Birth/Sex: Treating RN: February 26, 1941 (82 y.o. M) Epps,  Lyla Son Primary Care Provider: Windle [name] Other Clinician: Referring Provider: Treating Provider/Extender: [name] in Treatment: 0 Information Obtained From Patient Eyes Medical History: Positive for: Cataracts - bilateral removal; Glaucoma Negative for: Optic Neuritis Ear/Nose/Mouth/Throat Medical History: Negative for: Chronic sinus problems/congestion; Middle ear problems Past Medical History Notes: Hard of Hearing Hematologic/Lymphatic Medical History: Negative for: Anemia; Hemophilia; Human Immunodeficiency Virus; Lymphedema; Sickle Cell Disease Respiratory Medical History: Negative for: Aspiration; Asthma; Chronic Obstructive Pulmonary Disease (COPD); Pneumothorax; Sleep Apnea; Tuberculosis Cardiovascular Medical History: Positive for: Arrhythmia - a fib; Coronary Artery Disease; Hypertension; Myocardial Infarction - 2000; Peripheral Arterial Disease; Peripheral Venous Disease Gastrointestinal [name], James L ([id number]) [phone].pdf Page 9 of 10 Medical History: Negative for: Cirrhosis ; Colitis; Crohns; Hepatitis A; Hepatitis B; Hepatitis C Endocrine Medical History: Negative for: Type I Diabetes; Type II Diabetes Genitourinary Medical History: Negative for: End Stage Renal Disease Immunological Medical History: Negative for: Lupus Erythematosus; Raynauds; Scleroderma Integumentary (Skin) Medical History: Negative for: History of Burn; History of pressure wounds Musculoskeletal Medical History: Positive  for: Gout; Osteoarthritis Negative for: Rheumatoid Arthritis; Osteomyelitis Neurologic Medical History: Negative for: Dementia; Neuropathy; Quadriplegia; Paraplegia; Seizure Disorder Oncologic Medical History: Negative for: Received Chemotherapy; Received Radiation Psychiatric Medical History: Negative for: Anorexia/bulimia; Confinement Anxiety HBO Extended History Items Eyes: Eyes: Cataracts Glaucoma Immunizations Pneumococcal Vaccine: Received Pneumococcal Vaccination: Yes Received Pneumococcal Vaccination On or After 60th Birthday: Yes Implantable Devices Yes Family and Social History Cancer: No; Diabetes: No; Heart Disease: Yes - Father,Siblings; Hereditary Spherocytosis: No; Hypertension: Yes - Siblings; Kidney Disease: No; Lung Disease: No; Seizures: No; Stroke: No; Thyroid Problems: No; Tuberculosis: No; Never smoker; Marital Status - Widowed; Alcohol Use: Never; Drug Use: No History; Caffeine Use: Rarely; Financial Concerns: No; Food, Clothing or Shelter Needs: No; Support System Lacking: No; Transportation Concerns: No Electronic Signature(s) Signed: 04/27/2023 6:45:54 PM By: James Derry PA-C Signed: 05/07/2023 11:59:03 AM By: Yevonne Pax RN Entered By: Yevonne Pax on 04/27/2023 06:46:47 [name], [name] ([id number]) [id number] Page 10 of 10 -------------------------------------------------------------------------------- SuperBill Details Patient Name: Date of Service: CREE, STRAWDER RD Elbert Ewings 04/27/2023 Medical Record Number: [id number] Patient Account Number: [id number] Date of Birth/Sex: Treating RN: Jan 01, 1941 (82 y.o. [name]) Yevonne Pax Primary Care Provider: Windle [name] Other Clinician: Referring Provider: Treating Provider/Extender: [name] in Treatment: 0 Diagnosis Coding ICD-10 Codes Code Description I89.0 Lymphedema, not elsewhere classified I87.332 Chronic venous hypertension (idiopathic) with ulcer and  inflammation of left lower extremity L97.822 Non-pressure chronic ulcer of other part of left lower leg with fat layer exposed I10 Essential (primary) hypertension Z79.01 Long term (current) use of anticoagulants Z95.0 Presence of cardiac pacemaker I25.10 Atherosclerotic heart disease of native coronary artery without angina pectoris N18.9 Chronic kidney disease, unspecified I50.42 Chronic combined systolic (congestive) and diastolic (congestive) heart failure I48.0 Paroxysmal atrial fibrillation Facility Procedures : CPT4 Code: [id number] Description: 99213 - WOUND CARE VISIT-LEV 3 EST PT Modifier: Quantity: 1 Physician Procedures : CPT4 Code Description Modifier [id number] WC PHYS LEVEL 3 NEW PT ICD-10 Diagnosis Description I89.0 Lymphedema, not elsewhere classified I87.332 Chronic venous hypertension (idiopathic) with ulcer and inflammation of left lower extremity L97.822  Non-pressure chronic ulcer of other part of left lower leg with fat layer exposed I10 Essential (primary) hypertension Quantity: 1 Electronic Signature(s) Signed: 04/27/2023 6:35:17 PM By: James Derry PA-C Entered By: [name] on 04/27/2023 15:35:16

## 2023-05-11 ENCOUNTER — Ambulatory Visit: Payer: Medicare PPO | Attending: Internal Medicine

## 2023-05-11 DIAGNOSIS — I5022 Chronic systolic (congestive) heart failure: Secondary | ICD-10-CM

## 2023-05-11 DIAGNOSIS — Z95810 Presence of automatic (implantable) cardiac defibrillator: Secondary | ICD-10-CM

## 2023-05-12 NOTE — Progress Notes (Signed)
EPIC Encounter for ICM Monitoring  Patient Name: [name] is a 82 y.o. male Date: 05/12/2023 Primary Care Physican: Kaleen Mask, MD Primary Cardiologist: Nahser Electrophysiologist: Graciela Husbands 08/17/2022 Weight: 225 lbs 08/26/2022 Weight: 224 lbs 12/15/2022 Weight: 219 lbs 12/23/2022 Weight: 217 lbs 01/22/2023 Weight: 217-220   Time in AT/AF 24.0 hours                                                                        Spoke with patient and heart failure questions reviewed.  Transmission results reviewed.  Pt asymptomatic for fluid accumulation.  Reports feeling well at this time and voices no complaints.     Diet:  He eats restaurant food once a week.  He eats sweets and some snacks.  Does not limit salt intake.    HeartLogic Heart Failure Index 0 suggesting normal fluid levels.      Prescribed: Furosemide 40 mg take 1 tablet by mouth daily Potassium 10 mEq ke 1 tablet by mouth daily Spironolactone 25 mg take 0.5 tablet (12.5 mg total) daily at bedtime   Labs: 12/17/2022 BNP 1,790 09/05/2022 Creatinine 1.17, GFR 62 05/25/2022 Creatinine 1.03, BUN 16, Potassium 4.6, Sodium 142, GFR 73 05/04/2022 Creatinine 1.20, BUN 17, Potassium 4.4, Sodium  A complete set of results can be found in Results Review.   Recommendations:  No changes and encouraged to call if experiencing any fluid symptoms.   Follow-up plan: ICM clinic phone appointment on 06/14/2023.   91 day device clinic remote transmission 05/13/2023.     EP/Cardiology Office Visits:  Recall 04/17/2023 with Dr Graciela Husbands.   06/15/2023 with Dr Elease Hashimoto.   Copy of ICM check sent to Dr. Graciela Husbands.  3 Month HeartLogicT Heart Failure Index:    8 Day Data Trend:          Karie Soda, RN 05/12/2023 1:38 PM

## 2023-05-13 ENCOUNTER — Ambulatory Visit (INDEPENDENT_AMBULATORY_CARE_PROVIDER_SITE_OTHER): Payer: Medicare PPO

## 2023-05-13 DIAGNOSIS — I255 Ischemic cardiomyopathy: Secondary | ICD-10-CM

## 2023-05-13 LAB — CUP PACEART REMOTE DEVICE CHECK
Battery Remaining Longevity: 66 mo
Battery Remaining Percentage: 75 %
Brady Statistic RA Percent Paced: 0 %
Brady Statistic RV Percent Paced: 62 %
Date Time Interrogation Session: 20240904072700
HighPow Impedance: 45 Ohm
Implantable Lead Connection Status: 753985
Implantable Lead Connection Status: 753985
Implantable Lead Connection Status: 753985
Implantable Lead Implant Date: 20040315
Implantable Lead Implant Date: 20100611
Implantable Lead Implant Date: 20100611
Implantable Lead Location: 753858
Implantable Lead Location: 753859
Implantable Lead Location: 753860
Implantable Lead Model: 4196
Implantable Lead Model: 5076
Implantable Lead Model: 6947
Implantable Pulse Generator Implant Date: 20230308
Lead Channel Impedance Value: 460 Ohm
Lead Channel Impedance Value: 461 Ohm
Lead Channel Impedance Value: 485 Ohm
Lead Channel Pacing Threshold Amplitude: 0.8 V
Lead Channel Pacing Threshold Amplitude: 2.6 V
Lead Channel Pacing Threshold Pulse Width: 0.4 ms
Lead Channel Pacing Threshold Pulse Width: 1.5 ms
Lead Channel Setting Pacing Amplitude: 2 V
Lead Channel Setting Pacing Amplitude: 2.5 V
Lead Channel Setting Pacing Amplitude: 3 V
Lead Channel Setting Pacing Pulse Width: 0.4 ms
Lead Channel Setting Pacing Pulse Width: 1.5 ms
Lead Channel Setting Sensing Sensitivity: 0.5 mV
Lead Channel Setting Sensing Sensitivity: 1 mV
Pulse Gen Serial Number: 152344

## 2023-05-16 NOTE — Progress Notes (Unsigned)
Cardiology Office Note Date:  05/16/2023  Patient ID:  [name], [name] 1940/10/05, MRN [id number] PCP:  Kaleen Mask, MD  Cardiologist:  Dr. Elease Hashimoto EP: Dr. Graciela Husbands    Chief Complaint:  ***  6 mo visit  History of Present Illness: [name] is a 82 y.o. male with history of CAD, chronic CHF (systolic), ICD, BPH,  HLD, gout, AFib.  He saw Dr. Graciela Husbands 10/19/22, discussed no appreciable improvement in symptoms post DCCV 05/2022 Not feeling well, SOB, chronic GI issues and back pain Length talk this visit Pt requested DNR status HV tx were programmed OFF Also >> VVIR Amiodarone stopped  He saw cardiology team 12/17/22, felt to be euvolemic, no changes were made.  *** rates *** BP % *** volume  Device information MDT CRT-D, gen change 11/07/2014, gen change 11/12/21 now with a BSci generator RA lead 2010 RV lead 2004 LV lead 2011  10/19/22 : DNR >> HV tx OFF  Known to have LV lead diaphragmatic stim that has been very difficult to program around Known high LV threshold   Past Medical History:  Diagnosis Date   AICD (automatic cardioverter/defibrillator) present    Arthritis    knees, back    BPH (benign prostatic hypertrophy)    CHF (congestive heart failure) (HCC)    Chronic kidney disease    BPH   Chronic systolic heart failure (HCC)    a. s/p MDT single chamber ICD 2004 as part of MASTER study b. upgrade to CRTD 2010; CRTD gen change 2016   Coronary artery disease    a. s/p anterior MI and CYPHER stent to LAD 2005   Dyslipidemia    Dysrhythmia    a-fib   Gout    Ischemic cardiomyopathy    Paroxysmal atrial fibrillation (HCC)    Ventricular tachycardia (HCC)     Past Surgical History:  Procedure Laterality Date   APPENDECTOMY  1970   BI-VENTRICULAR IMPLANTABLE CARDIOVERTER DEFIBRILLATOR UPGRADE N/A 11/07/2014   a. MDT single chamber ICD implanted 2004 as part of MASTER study; upgrade to CRTD 2010; gen change 2016   BIV ICD GENERATOR CHANGEOUT N/A  11/12/2021   Procedure: BIV ICD GENERATOR CHANGEOUT;  Surgeon: Duke Salvia, MD;  Location: [facility] INVASIVE CV LAB;  Service: Cardiovascular;  Laterality: N/A;   CARDIAC CATHETERIZATION  09/08/2003   x3 total of 5 stents   CARDIOVERSION N/A 07/04/2019   Procedure: CARDIOVERSION;  Surgeon: Wendall Stade, MD;  Location: [facility] ENDOSCOPY;  Service: Cardiovascular;  Laterality: N/A;   CARDIOVERSION N/A 08/17/2019   Procedure: CARDIOVERSION;  Surgeon: Vesta Mixer, MD;  Location: [facility] ENDOSCOPY;  Service: Cardiovascular;  Laterality: N/A;   CARDIOVERSION N/A 05/04/2022   Procedure: CARDIOVERSION;  Surgeon: Quintella Reichert, MD;  Location: [facility] ENDOSCOPY;  Service: Cardiovascular;  Laterality: N/A;   PROSTATECTOMY  11/06/2011   Procedure: PROSTATECTOMY SUPRAPUBIC;  Surgeon: Kathi Ludwig, MD;  Location: [facility] ORS;  Service: Urology;  Laterality: N/A;  Open Suprapubic Prostatectomy   TEE WITHOUT CARDIOVERSION N/A 05/16/2019   Procedure: TRANSESOPHAGEAL ECHOCARDIOGRAM (TEE);  Surgeon: Sande Rives, MD;  Location: [facility] ENDOSCOPY;  Service: Cardiology;  Laterality: N/A;   TOTAL KNEE ARTHROPLASTY Left 10/06/2021   Procedure: TOTAL KNEE ARTHROPLASTY;  Surgeon: Jodi Geralds, MD;  Location: [facility] ORS;  Service: Orthopedics;  Laterality: Left;    Current Outpatient Medications  Medication Sig Dispense Refill   allopurinol (ZYLOPRIM) 100 MG tablet Take 100 mg by mouth at bedtime.  carvedilol (COREG) 3.125 MG tablet TAKE 1 TABLET TWICE DAILY 180 tablet 2   ELIQUIS 5 MG TABS tablet TAKE 1 TABLET TWICE DAILY 180 tablet 3   EPINEPHrine 0.3 mg/0.3 mL IJ SOAJ injection Inject 0.3 mg into the muscle as needed.     furosemide (LASIX) 40 MG tablet Take 1 tablet (40 mg total) by mouth daily. 90 tablet 3   latanoprost (XALATAN) 0.005 % ophthalmic solution Place 1 drop into both eyes at bedtime.      levothyroxine (SYNTHROID) 125 MCG tablet Take 125 mcg by mouth daily before breakfast.     Lysine 1000 MG TABS Take  1 tablet by mouth daily in the afternoon.     metFORMIN (GLUCOPHAGE) 500 MG tablet Take 500 mg by mouth daily.     nitroGLYCERIN (NITROSTAT) 0.4 MG SL tablet Place 1 tablet (0.4 mg total) under the tongue every 5 (five) minutes as needed for chest pain. 30 tablet 3   Polyethyl Glycol-Propyl Glycol 0.4-0.3 % SOLN Place 1 drop into both eyes daily.     potassium chloride (KLOR-CON) 10 MEQ tablet Take 1 tablet (10 mEq total) by mouth daily. 90 tablet 3   rosuvastatin (CRESTOR) 10 MG tablet TAKE 1/2 TABLET EVERY DAY 45 tablet 2   tiZANidine (ZANAFLEX) 2 MG tablet Take 2 mg by mouth at bedtime.     TURMERIC PO Take 1 tablet by mouth daily in the afternoon.     No current facility-administered medications for this visit.    Allergies:   Lipitor [atorvastatin] and Jardiance [empagliflozin]   Social History:  The patient  reports that he has never smoked. He has never been exposed to tobacco smoke. He has never used smokeless tobacco. He reports that he does not drink alcohol and does not use drugs.   Family History:  The patient's family history includes Heart disease in his brother, brother, sister, and sister.  ROS:  Please see the history of present illness.  All other systems are reviewed and otherwise negative.   PHYSICAL EXAM:  VS:  There were no vitals taken for this visit. BMI: There is no height or weight on file to calculate BMI. Well nourished, well developed, in no acute distress  HEENT: normocephalic, atraumatic  Neck: no JVD, carotid bruits or masses Cardiac:  *** irreg-irreg; no significant murmurs, no rubs, or gallops Lungs: ***  CTA b/l no wheezing, rhonchi or rales  Abd: soft, nontender MS: no deformity or atrophy Ext: *** trace ankle edema Skin: warm and dry, no rash Neuro:  No gross deficits appreciated Psych: euthymic mood, full affect  *** ICD site is stable, no tethering or discomfort   EKG:  not done today  ICD interrogation done today and reviewed by myself:   *** Battery and lead measurements are OK ***   11/23/2019: TTE IMPRESSIONS  1. Left ventricular ejection fraction, by estimation, is 30 to 35%. The  left ventricle has moderately decreased function. The left ventricle  demonstrates regional wall motion abnormalities (see scoring  diagram/findings for description). The left  ventricular internal cavity size was mildly dilated. Left ventricular  diastolic parameters are consistent with Grade II diastolic dysfunction  (pseudonormalization). Elevated left ventricular end-diastolic pressure.   2. Right ventricular systolic function is normal. The right ventricular  size is normal.   3. The mitral valve is normal in structure. Mild mitral valve  regurgitation. No evidence of mitral stenosis.   4. The aortic valve is tricuspid. Aortic valve regurgitation is mild. No  aortic stenosis is present.   5. Aortic dilatation noted. There is mild dilatation of the ascending  aorta.   6. The inferior vena cava is normal in size with greater than 50%  respiratory variability, suggesting right atrial pressure of 3 mmHg.    12/20/2019: Stress myoview The left ventricular ejection fraction is severely decreased (<30%). Nuclear stress EF: 23%. There was no ST segment deviation noted during stress. There is a large defect of severe severity present in the basal anteroseptal, basal inferoseptal, mid anterior, mid anteroseptal, mid inferoseptal, apical anterior, apical septal and apex location. This consistent with prior infarct/scar. No ischemia. Findings consistent with prior myocardial infarction. This is a high risk study.   Recent Labs: 05/25/2022: ALT 26; BUN 16; Creatinine, Ser 1.03; Potassium 4.6; Sodium 142 12/17/2022: NT-Pro BNP 1,790; TSH 1.120  05/25/2022: Chol/HDL Ratio 4.0; Cholesterol, Total 133; HDL 33; LDL Chol Calc (NIH) 72; Triglycerides 164   CrCl cannot be calculated (Patient's most recent lab result is older than the maximum 21 days  allowed.).   Wt Readings from Last 3 Encounters:  12/17/22 221 lb 6.4 oz (100.4 kg)  10/19/22 219 lb (99.3 kg)  09/21/22 222 lb 12.8 oz (101.1 kg)     Other studies reviewed: Additional studies/records reviewed today include: summarized above  ASSESSMENT AND PLAN:  1. CRT-D      *** Intact function      *** no programming changes made  2.  ICM 3. Chronic CHF     *** Exam looks OK currently     *** % BV pacing  He has been very sensitive/intolerant to meds all along BP does not allow much room Sounds like he gets orthostatic quickly with meds as well *** c/w Dr. Ladean Raya  4. CAD     ***      C/w Dr. Elease Hashimoto   5. Persistent >> permanent Afib     CHA2DS2Vasc is 4, on Eliquis, *** appropriately dosed     *** rates  6. Secondary hypercoagulable state 2/2 AFib         Disposition: ***  Current medicines are reviewed at length with the patient today.  The patient did not have any concerns regarding medicines.  Norma Fredrickson, PA-C 05/16/2023 5:41 PM     [facility] [address] [place] [address] [phone] (office)  [phone] (fax)

## 2023-05-18 ENCOUNTER — Other Ambulatory Visit: Payer: Self-pay | Admitting: Physician Assistant

## 2023-05-18 ENCOUNTER — Encounter: Payer: Self-pay | Admitting: Physician Assistant

## 2023-05-18 ENCOUNTER — Ambulatory Visit: Payer: Medicare PPO | Attending: Physician Assistant | Admitting: Physician Assistant

## 2023-05-18 DIAGNOSIS — I255 Ischemic cardiomyopathy: Secondary | ICD-10-CM

## 2023-05-18 DIAGNOSIS — I2510 Atherosclerotic heart disease of native coronary artery without angina pectoris: Secondary | ICD-10-CM

## 2023-05-18 DIAGNOSIS — Z95810 Presence of automatic (implantable) cardiac defibrillator: Secondary | ICD-10-CM | POA: Diagnosis not present

## 2023-05-18 DIAGNOSIS — Z79899 Other long term (current) drug therapy: Secondary | ICD-10-CM

## 2023-05-18 DIAGNOSIS — I4821 Permanent atrial fibrillation: Secondary | ICD-10-CM

## 2023-05-18 DIAGNOSIS — I5022 Chronic systolic (congestive) heart failure: Secondary | ICD-10-CM | POA: Diagnosis not present

## 2023-05-18 DIAGNOSIS — D6869 Other thrombophilia: Secondary | ICD-10-CM

## 2023-05-18 LAB — CUP PACEART INCLINIC DEVICE CHECK
Date Time Interrogation Session: 20240910131838
Implantable Lead Connection Status: 753985
Implantable Lead Connection Status: 753985
Implantable Lead Connection Status: 753985
Implantable Lead Implant Date: 20040315
Implantable Lead Implant Date: 20100611
Implantable Lead Implant Date: 20100611
Implantable Lead Location: 753858
Implantable Lead Location: 753859
Implantable Lead Location: 753860
Implantable Lead Model: 4196
Implantable Lead Model: 5076
Implantable Lead Model: 6947
Implantable Pulse Generator Implant Date: 20230308
Lead Channel Pacing Threshold Amplitude: 0.8 V
Lead Channel Pacing Threshold Amplitude: 2.5 V
Lead Channel Pacing Threshold Pulse Width: 0.4 ms
Lead Channel Pacing Threshold Pulse Width: 1.5 ms
Lead Channel Sensing Intrinsic Amplitude: 0.9 mV
Lead Channel Sensing Intrinsic Amplitude: 16.8 mV
Lead Channel Sensing Intrinsic Amplitude: 25 mV
Pulse Gen Serial Number: 152344

## 2023-05-18 NOTE — Patient Instructions (Addendum)
Medication Instructions:   NONE ORDERED  TODAY   *If you need a refill on your cardiac medications before your next appointment, please call your pharmacy*   Lab Work:  CBC  AND BLOOD CULTURE  DOWN STAIRS FIRST FLOOR  [facility] TODAY    If you have labs (blood work) drawn today and your tests are completely normal, you will receive your results only by: MyChart Message (if you have MyChart) OR A paper copy in the mail If you have any lab test that is abnormal or we need to change your treatment, we will call you to review the results.   Testing/Procedures:  CHEST ULTSASOUND  is a noninvasive diagnostic exam that produces images, which used to assess the organs and structures within the chest     Follow-Up: At [facility], you and your health needs are our priority.  As part of our continuing mission to provide you with exceptional heart care, we have created designated Provider Care Teams.  These Care Teams include your primary Cardiologist (physician) and Advanced Practice Providers (APPs -  Physician Assistants and Nurse Practitioners) who all work together to provide you with the care you need, when you need it.  We recommend signing up for the patient portal called "MyChart".  Sign up information is provided on this After Visit Summary.  MyChart is used to connect with patients for Virtual Visits (Telemedicine).  Patients are able to view lab/test results, encounter notes, upcoming appointments, etc.  Non-urgent messages can be sent to your provider as well.   To learn more about what you can do with MyChart, go to [email or web address].    Your next appointment:  ON DAY DR Graciela Husbands IN OFFICE  2 week(s)   Provider:   You may see  one of the following Advanced Practice Providers on your designated Care Team:   Francis Dowse, [place] ( double book if need be )  OR  Otilio Saber      Other Instructions

## 2023-05-19 NOTE — Progress Notes (Unsigned)
Cardiology Office Note Date:  05/19/2023  Patient ID:  [name], [name] 1941/02/23, MRN [id number] PCP:  Kaleen Mask, MD  Cardiologist:  Dr. Elease Hashimoto EP: Dr. Graciela Husbands    Chief Complaint:    ICD site check  History of Present Illness: [name] is a 82 y.o. male with history of CAD, chronic CHF (systolic), ICD, BPH,  HLD, gout, AFib.  He saw Dr. Graciela Husbands 10/19/22, discussed no appreciable improvement in symptoms post DCCV 05/2022 Not feeling well, SOB, chronic GI issues and back pain Length talk this visit Pt requested DNR status HV tx were programmed OFF Also >> VVIR Amiodarone stopped  He saw cardiology team 12/17/22, felt to be euvolemic, no changes were made.  I saw him 05/18/23 He is concerned about swelling at his ICD site He denies fever or symptoms of illness No pain His L shoulder aches when he is first up, but eases off soon after up and around. No CP, palpitations or cardiac awareness Denies any SOB No dizzy spells, near syncope or syncope. He follows with the wound clinic on/off for LE wounds, LLE of late, though healed now He did scrape the back of his R lower leg has a wound there now, though new. We discussed his LE/wound >> he will call the wound clinic today to be evaluated/treated, if unable there will call his PMD  End of day had opportunity to d/w Dr. Graciela Husbands, he wanted to see the patient back in the office/evaluate and discuss with Dr. Ladona Ridgel    TODAY Site feels a little more swollen, more tight and uncomfortable Otherwise no changes from the other day No new symptoms   Device information MDT CRT-D, gen change 11/07/2014, gen change 11/12/21 now with a BSci generator RA lead 2010 RV lead 2004 LV lead 2010  10/19/22 : DNR >> HV tx OFF  Known to have LV lead diaphragmatic stim that has been very difficult to program around Known high LV threshold   Past Medical History:  Diagnosis Date   AICD (automatic cardioverter/defibrillator) present     Arthritis    knees, back    BPH (benign prostatic hypertrophy)    CHF (congestive heart failure) (HCC)    Chronic kidney disease    BPH   Chronic systolic heart failure (HCC)    a. s/p MDT single chamber ICD 2004 as part of MASTER study b. upgrade to CRTD 2010; CRTD gen change 2016   Coronary artery disease    a. s/p anterior MI and CYPHER stent to LAD 2005   Dyslipidemia    Dysrhythmia    a-fib   Gout    Ischemic cardiomyopathy    Paroxysmal atrial fibrillation (HCC)    Ventricular tachycardia (HCC)     Past Surgical History:  Procedure Laterality Date   APPENDECTOMY  1970   BI-VENTRICULAR IMPLANTABLE CARDIOVERTER DEFIBRILLATOR UPGRADE N/A 11/07/2014   a. MDT single chamber ICD implanted 2004 as part of MASTER study; upgrade to CRTD 2010; gen change 2016   BIV ICD GENERATOR CHANGEOUT N/A 11/12/2021   Procedure: BIV ICD GENERATOR CHANGEOUT;  Surgeon: Duke Salvia, MD;  Location: [facility] INVASIVE CV LAB;  Service: Cardiovascular;  Laterality: N/A;   CARDIAC CATHETERIZATION  09/08/2003   x3 total of 5 stents   CARDIOVERSION N/A 07/04/2019   Procedure: CARDIOVERSION;  Surgeon: Wendall Stade, MD;  Location: [facility] ENDOSCOPY;  Service: Cardiovascular;  Laterality: N/A;   CARDIOVERSION N/A 08/17/2019   Procedure: CARDIOVERSION;  Surgeon: Kristeen Miss  J, MD;  Location: [facility] ENDOSCOPY;  Service: Cardiovascular;  Laterality: N/A;   CARDIOVERSION N/A 05/04/2022   Procedure: CARDIOVERSION;  Surgeon: Quintella Reichert, MD;  Location: [facility] ENDOSCOPY;  Service: Cardiovascular;  Laterality: N/A;   PROSTATECTOMY  11/06/2011   Procedure: PROSTATECTOMY SUPRAPUBIC;  Surgeon: Kathi Ludwig, MD;  Location: [facility] ORS;  Service: Urology;  Laterality: N/A;  Open Suprapubic Prostatectomy   TEE WITHOUT CARDIOVERSION N/A 05/16/2019   Procedure: TRANSESOPHAGEAL ECHOCARDIOGRAM (TEE);  Surgeon: Sande Rives, MD;  Location: [facility] ENDOSCOPY;  Service: Cardiology;  Laterality: N/A;   TOTAL KNEE ARTHROPLASTY  Left 10/06/2021   Procedure: TOTAL KNEE ARTHROPLASTY;  Surgeon: Jodi Geralds, MD;  Location: [facility] ORS;  Service: Orthopedics;  Laterality: Left;    Current Outpatient Medications  Medication Sig Dispense Refill   allopurinol (ZYLOPRIM) 100 MG tablet Take 100 mg by mouth at bedtime.     carvedilol (COREG) 3.125 MG tablet TAKE 1 TABLET TWICE DAILY 180 tablet 2   ELIQUIS 5 MG TABS tablet TAKE 1 TABLET TWICE DAILY 180 tablet 3   furosemide (LASIX) 40 MG tablet Take 1 tablet (40 mg total) by mouth daily. 90 tablet 3   latanoprost (XALATAN) 0.005 % ophthalmic solution Place 1 drop into both eyes at bedtime.      levothyroxine (SYNTHROID) 125 MCG tablet Take 125 mcg by mouth daily before breakfast.     Lysine 1000 MG TABS Take 1 tablet by mouth daily in the afternoon.     nitroGLYCERIN (NITROSTAT) 0.4 MG SL tablet Place 1 tablet (0.4 mg total) under the tongue every 5 (five) minutes as needed for chest pain. 30 tablet 3   Polyethyl Glycol-Propyl Glycol 0.4-0.3 % SOLN Place 1 drop into both eyes daily.     potassium chloride (KLOR-CON) 10 MEQ tablet Take 1 tablet (10 mEq total) by mouth daily. 90 tablet 3   rosuvastatin (CRESTOR) 10 MG tablet TAKE 1/2 TABLET EVERY DAY 45 tablet 2   TURMERIC PO Take 1 tablet by mouth daily in the afternoon.     No current facility-administered medications for this visit.    Allergies:   Lipitor [atorvastatin] and Jardiance [empagliflozin]   Social History:  The patient  reports that he has never smoked. He has never been exposed to tobacco smoke. He has never used smokeless tobacco. He reports that he does not drink alcohol and does not use drugs.   Family History:  The patient's family history includes Heart disease in his brother, brother, sister, and sister.  ROS:  Please see the history of present illness.  All other systems are reviewed and otherwise negative.   PHYSICAL EXAM:  VS:  There were no vitals taken for this visit. BMI: There is no height or weight  on file to calculate BMI. Well nourished, well developed, in no acute distress  HEENT: normocephalic, atraumatic  Neck: no JVD, carotid bruits or masses Cardiac:  RRR (paced); no significant murmurs, no rubs, or gallops Lungs: CTA b/l no wheezing, rhonchi or rales  Abd: soft, nontender MS: no deformity or atrophy Ext: has chronic looking skin changes b/l LE, no open wound appreciated LLE He has a bandage posterior RLE (not removed, slight erythema/edema in comparison to the left Skin: warm and dry, no rash Neuro:  No gross deficits appreciated Psych: euthymic mood, full affect  ICD site remains swollen, no erythema, skin changes, heat, obvious fluid collectionat the pocket, slightly fluctuant, no tenderness   EKG:  done today and reviewed by myself AFib,  65bpm, V pacing, PVC  ICD interrogation done 05/18/23 and reviewed by myself:  Check on underlying rhythm today is AFib 60's-70s Programmed VVIR 80 Tachy therapies remain OFF   11/23/2019: TTE IMPRESSIONS  1. Left ventricular ejection fraction, by estimation, is 30 to 35%. The  left ventricle has moderately decreased function. The left ventricle  demonstrates regional wall motion abnormalities (see scoring  diagram/findings for description). The left  ventricular internal cavity size was mildly dilated. Left ventricular  diastolic parameters are consistent with Grade II diastolic dysfunction  (pseudonormalization). Elevated left ventricular end-diastolic pressure.   2. Right ventricular systolic function is normal. The right ventricular  size is normal.   3. The mitral valve is normal in structure. Mild mitral valve  regurgitation. No evidence of mitral stenosis.   4. The aortic valve is tricuspid. Aortic valve regurgitation is mild. No  aortic stenosis is present.   5. Aortic dilatation noted. There is mild dilatation of the ascending  aorta.   6. The inferior vena cava is normal in size with greater than 50%  respiratory  variability, suggesting right atrial pressure of 3 mmHg.    12/20/2019: Stress myoview The left ventricular ejection fraction is severely decreased (<30%). Nuclear stress EF: 23%. There was no ST segment deviation noted during stress. There is a large defect of severe severity present in the basal anteroseptal, basal inferoseptal, mid anterior, mid anteroseptal, mid inferoseptal, apical anterior, apical septal and apex location. This consistent with prior infarct/scar. No ischemia. Findings consistent with prior myocardial infarction. This is a high risk study.   Recent Labs: 05/25/2022: ALT 26; BUN 16; Creatinine, Ser 1.03; Potassium 4.6; Sodium 142 12/17/2022: NT-Pro BNP 1,790; TSH 1.120 05/18/2023: Hemoglobin 15.1; Platelets 244  05/25/2022: Chol/HDL Ratio 4.0; Cholesterol, Total 133; HDL 33; LDL Chol Calc (NIH) 72; Triglycerides 164   CrCl cannot be calculated (Patient's most recent lab result is older than the maximum 21 days allowed.).   Wt Readings from Last 3 Encounters:  12/17/22 221 lb 6.4 oz (100.4 kg)  10/19/22 219 lb (99.3 kg)  09/21/22 222 lb 12.8 oz (101.1 kg)     Other studies reviewed: Additional studies/records reviewed today include: summarized above  ASSESSMENT AND PLAN:  1. CRT-D      VVIR today  Mostly likely pocket infection Dr. Ladona Ridgel recommended CT chest to evaluate for the low chance or AV fistula, or potential lead external to vessel that may be causing swelling, discussed with patient that mechanical/vascular cause of swelling was unlikely  Continues to deny symptoms of illness BC neg so far (x2)  I have ordered a CT w and w/o contrast Reached out to our RN coordinator for cardiac CTs for any particular protocol for CT to look at the subclavian vein/vasculature/leads  I have a back stop appointment in a few weeks, bith Drs Ladona Ridgel and Graciela Husbands have seen him today, discussed likely infection, and plans to discuss further best POC. With no systemic  symptoms, will hold off on antibiotics for the cultures  2.  ICM 3. Chronic CHF     Exam looks OK currently     90 % BV pacing     Heart logic score is zero  He has been very sensitive/intolerant to meds all along BP does not allow much room Sounds like he gets orthostatic quickly with meds as well c/w Dr. Ladean Raya  4. CAD     No symptoms of angina      C/w Dr. Elease Hashimoto   5. Persistent >>  permanent Afib     CHA2DS2Vasc is 4, on Eliquis, appropriately dosed     90% VP by his device check a couple days ago  6. Secondary hypercoagulable state 2/2 AFib         Disposition: as above     Current medicines are reviewed at length with the patient today.  The patient did not have any concerns regarding medicines.  Norma Fredrickson, PA-C 05/19/2023 9:34 AM     [facility] [address] [place] [address] [phone] (office)  [phone] (fax)

## 2023-05-20 ENCOUNTER — Other Ambulatory Visit: Payer: Self-pay | Admitting: *Deleted

## 2023-05-20 ENCOUNTER — Ambulatory Visit: Payer: Medicare PPO | Attending: Internal Medicine | Admitting: Physician Assistant

## 2023-05-20 ENCOUNTER — Encounter: Payer: Self-pay | Admitting: Physician Assistant

## 2023-05-20 VITALS — BP 110/66 | HR 73 | Ht 70.0 in | Wt 225.8 lb

## 2023-05-20 DIAGNOSIS — I5022 Chronic systolic (congestive) heart failure: Secondary | ICD-10-CM | POA: Diagnosis not present

## 2023-05-20 DIAGNOSIS — T827XXD Infection and inflammatory reaction due to other cardiac and vascular devices, implants and grafts, subsequent encounter: Secondary | ICD-10-CM

## 2023-05-20 DIAGNOSIS — Z95810 Presence of automatic (implantable) cardiac defibrillator: Secondary | ICD-10-CM | POA: Diagnosis not present

## 2023-05-20 LAB — CUP PACEART INCLINIC DEVICE CHECK
Date Time Interrogation Session: 20240912171353
Implantable Lead Connection Status: 753985
Implantable Lead Connection Status: 753985
Implantable Lead Connection Status: 753985
Implantable Lead Implant Date: 20040315
Implantable Lead Implant Date: 20100611
Implantable Lead Implant Date: 20100611
Implantable Lead Location: 753858
Implantable Lead Location: 753859
Implantable Lead Location: 753860
Implantable Lead Model: 4196
Implantable Lead Model: 5076
Implantable Lead Model: 6947
Implantable Pulse Generator Implant Date: 20230308
Lead Channel Setting Pacing Amplitude: 2.5 V
Lead Channel Setting Pacing Amplitude: 3 V
Lead Channel Setting Pacing Pulse Width: 0.4 ms
Lead Channel Setting Pacing Pulse Width: 1.5 ms
Lead Channel Setting Sensing Sensitivity: 0.5 mV
Lead Channel Setting Sensing Sensitivity: 1 mV
Pulse Gen Serial Number: 152344

## 2023-05-20 NOTE — Patient Instructions (Signed)
Medication Instructions:   Your physician recommends that you continue on your current medications as directed. Please refer to the Current Medication list given to you today.   *If you need a refill on your cardiac medications before your next appointment, please call your pharmacy*   Lab Work: NONE ORDERED  TODAY    If you have labs (blood work) drawn today and your tests are completely normal, you will receive your results only by: MyChart Message (if you have MyChart) OR A paper copy in the mail If you have any lab test that is abnormal or we need to change your treatment, we will call you to review the results.   Testing/Procedures: Non-Cardiac CT Angiography (CTA), is a special type of CT scan that uses a computer to produce multi-dimensional views of major blood vessels throughout the body. In CT angiography, a contrast material is injected through an IV to help visualize the blood vessels      Follow-Up: At [facility], you and your health needs are our priority.  As part of our continuing mission to provide you with exceptional heart care, we have created designated Provider Care Teams.  These Care Teams include your primary Cardiologist (physician) and Advanced Practice Providers (APPs -  Physician Assistants and Nurse Practitioners) who all work together to provide you with the care you need, when you need it.  We recommend signing up for the patient portal called "MyChart".  Sign up information is provided on this After Visit Summary.  MyChart is used to connect with patients for Virtual Visits (Telemedicine).  Patients are able to view lab/test results, encounter notes, upcoming appointments, etc.  Non-urgent messages can be sent to your provider as well.   To learn more about what you can do with MyChart, go to [email or web address].    Your next appointment:  AS SCHEDULED    Provider:   Francis Dowse, PA-C

## 2023-05-20 NOTE — Progress Notes (Signed)
Remote ICD transmission.   

## 2023-05-21 ENCOUNTER — Ambulatory Visit: Payer: Medicare PPO | Attending: Physician Assistant

## 2023-05-21 DIAGNOSIS — I5022 Chronic systolic (congestive) heart failure: Secondary | ICD-10-CM

## 2023-05-22 LAB — BASIC METABOLIC PANEL
BUN/Creatinine Ratio: 17 (ref 10–24)
BUN: 18 mg/dL (ref 8–27)
CO2: 25 mmol/L (ref 20–29)
Calcium: 9.2 mg/dL (ref 8.6–10.2)
Chloride: 100 mmol/L (ref 96–106)
Creatinine, Ser: 1.06 mg/dL (ref 0.76–1.27)
Glucose: 142 mg/dL — ABNORMAL HIGH (ref 70–99)
Potassium: 4.4 mmol/L (ref 3.5–5.2)
Sodium: 141 mmol/L (ref 134–144)
eGFR: 70 mL/min/{1.73_m2} (ref >59)

## 2023-05-24 ENCOUNTER — Ambulatory Visit (HOSPITAL_BASED_OUTPATIENT_CLINIC_OR_DEPARTMENT_OTHER)
Admission: RE | Admit: 2023-05-24 | Discharge: 2023-05-24 | Disposition: A | Discharge status: Home / Self Care | Payer: Medicare PPO | Source: Ambulatory Visit | Attending: Physician Assistant | Admitting: Physician Assistant

## 2023-05-24 ENCOUNTER — Ambulatory Visit (HOSPITAL_COMMUNITY): Payer: Medicare PPO

## 2023-05-24 DIAGNOSIS — T827XXA Infection and inflammatory reaction due to other cardiac and vascular devices, implants and grafts, initial encounter: Secondary | ICD-10-CM | POA: Diagnosis not present

## 2023-05-24 DIAGNOSIS — I5022 Chronic systolic (congestive) heart failure: Secondary | ICD-10-CM

## 2023-05-24 DIAGNOSIS — T827XXD Infection and inflammatory reaction due to other cardiac and vascular devices, implants and grafts, subsequent encounter: Secondary | ICD-10-CM | POA: Insufficient documentation

## 2023-05-24 LAB — CBC
Hematocrit: 45.1 % (ref 37.5–51.0)
Hemoglobin: 15.1 g/dL (ref 13.0–17.7)
MCH: 31.4 pg (ref 26.6–33.0)
MCHC: 33.5 g/dL (ref 31.5–35.7)
MCV: 94 fL (ref 79–97)
Platelets: 244 10*3/uL (ref 150–450)
RBC: 4.81 x10E6/uL (ref 4.14–5.80)
RDW: 12.6 % (ref 11.6–15.4)
WBC: 6.9 10*3/uL (ref 3.4–10.8)

## 2023-05-24 LAB — CULTURE, BLOOD (SINGLE)

## 2023-05-24 MED ORDER — IOHEXOL 350 MG/ML SOLN
75.0000 mL | Freq: Once | INTRAVENOUS | Status: AC | PRN
  Administered 2023-05-24: 75 mL via INTRAVENOUS

## 2023-05-26 ENCOUNTER — Telehealth: Payer: Self-pay | Admitting: Internal Medicine

## 2023-05-26 ENCOUNTER — Emergency Department (HOSPITAL_COMMUNITY): Payer: Medicare PPO

## 2023-05-26 ENCOUNTER — Encounter (HOSPITAL_COMMUNITY): Payer: Self-pay | Admitting: Emergency Medicine

## 2023-05-26 ENCOUNTER — Inpatient Hospital Stay (HOSPITAL_COMMUNITY): Payer: Medicare PPO

## 2023-05-26 ENCOUNTER — Inpatient Hospital Stay (HOSPITAL_COMMUNITY)
Admission: EM | Admit: 2023-05-26 | Discharge: 2023-05-27 | DRG: 315 | Disposition: A | Discharge status: Other Acute Inpatient Hospital | Payer: Medicare PPO | Attending: Cardiology | Admitting: Cardiology

## 2023-05-26 ENCOUNTER — Other Ambulatory Visit: Payer: Self-pay

## 2023-05-26 ENCOUNTER — Telehealth: Payer: Self-pay

## 2023-05-26 DIAGNOSIS — I2510 Atherosclerotic heart disease of native coronary artery without angina pectoris: Secondary | ICD-10-CM | POA: Diagnosis present

## 2023-05-26 DIAGNOSIS — E785 Hyperlipidemia, unspecified: Secondary | ICD-10-CM | POA: Diagnosis present

## 2023-05-26 DIAGNOSIS — Z79890 Hormone replacement therapy (postmenopausal): Secondary | ICD-10-CM | POA: Diagnosis not present

## 2023-05-26 DIAGNOSIS — Z95810 Presence of automatic (implantable) cardiac defibrillator: Secondary | ICD-10-CM | POA: Diagnosis not present

## 2023-05-26 DIAGNOSIS — Z7901 Long term (current) use of anticoagulants: Secondary | ICD-10-CM | POA: Diagnosis not present

## 2023-05-26 DIAGNOSIS — Z955 Presence of coronary angioplasty implant and graft: Secondary | ICD-10-CM

## 2023-05-26 DIAGNOSIS — I5022 Chronic systolic (congestive) heart failure: Secondary | ICD-10-CM | POA: Diagnosis present

## 2023-05-26 DIAGNOSIS — M7989 Other specified soft tissue disorders: Secondary | ICD-10-CM | POA: Diagnosis not present

## 2023-05-26 DIAGNOSIS — Z66 Do not resuscitate: Secondary | ICD-10-CM | POA: Diagnosis present

## 2023-05-26 DIAGNOSIS — Z79899 Other long term (current) drug therapy: Secondary | ICD-10-CM | POA: Diagnosis not present

## 2023-05-26 DIAGNOSIS — I4821 Permanent atrial fibrillation: Secondary | ICD-10-CM | POA: Diagnosis present

## 2023-05-26 DIAGNOSIS — I110 Hypertensive heart disease with heart failure: Secondary | ICD-10-CM | POA: Diagnosis present

## 2023-05-26 DIAGNOSIS — Y831 Surgical operation with implant of artificial internal device as the cause of abnormal reaction of the patient, or of later complication, without mention of misadventure at the time of the procedure: Secondary | ICD-10-CM | POA: Diagnosis present

## 2023-05-26 DIAGNOSIS — R609 Edema, unspecified: Secondary | ICD-10-CM | POA: Diagnosis not present

## 2023-05-26 DIAGNOSIS — Z6832 Body mass index (BMI) 32.0-32.9, adult: Secondary | ICD-10-CM | POA: Diagnosis not present

## 2023-05-26 DIAGNOSIS — N400 Enlarged prostate without lower urinary tract symptoms: Secondary | ICD-10-CM | POA: Diagnosis present

## 2023-05-26 DIAGNOSIS — I255 Ischemic cardiomyopathy: Secondary | ICD-10-CM | POA: Diagnosis present

## 2023-05-26 DIAGNOSIS — Z8249 Family history of ischemic heart disease and other diseases of the circulatory system: Secondary | ICD-10-CM | POA: Diagnosis not present

## 2023-05-26 DIAGNOSIS — E669 Obesity, unspecified: Secondary | ICD-10-CM | POA: Diagnosis present

## 2023-05-26 DIAGNOSIS — I252 Old myocardial infarction: Secondary | ICD-10-CM | POA: Diagnosis not present

## 2023-05-26 DIAGNOSIS — Z888 Allergy status to other drugs, medicaments and biological substances status: Secondary | ICD-10-CM

## 2023-05-26 DIAGNOSIS — Z96652 Presence of left artificial knee joint: Secondary | ICD-10-CM | POA: Diagnosis present

## 2023-05-26 DIAGNOSIS — I4891 Unspecified atrial fibrillation: Secondary | ICD-10-CM

## 2023-05-26 DIAGNOSIS — T829XXA Unspecified complication of cardiac and vascular prosthetic device, implant and graft, initial encounter: Principal | ICD-10-CM

## 2023-05-26 DIAGNOSIS — E039 Hypothyroidism, unspecified: Secondary | ICD-10-CM | POA: Diagnosis present

## 2023-05-26 DIAGNOSIS — T827XXA Infection and inflammatory reaction due to other cardiac and vascular devices, implants and grafts, initial encounter: Secondary | ICD-10-CM | POA: Diagnosis present

## 2023-05-26 LAB — CBC WITH DIFFERENTIAL/PLATELET
Abs Immature Granulocytes: 0.02 10*3/uL (ref 0.00–0.07)
Basophils Absolute: 0.1 10*3/uL (ref 0.0–0.1)
Basophils Relative: 1 %
Eosinophils Absolute: 0.3 10*3/uL (ref 0.0–0.5)
Eosinophils Relative: 4 %
HCT: 43.2 % (ref 39.0–52.0)
Hemoglobin: 13.9 g/dL (ref 13.0–17.0)
Immature Granulocytes: 0 %
Lymphocytes Relative: 17 %
Lymphs Abs: 1.1 10*3/uL (ref 0.7–4.0)
MCH: 31 pg (ref 26.0–34.0)
MCHC: 32.2 g/dL (ref 30.0–36.0)
MCV: 96.4 fL (ref 80.0–100.0)
Monocytes Absolute: 0.7 10*3/uL (ref 0.1–1.0)
Monocytes Relative: 11 %
Neutro Abs: 4.3 10*3/uL (ref 1.7–7.7)
Neutrophils Relative %: 67 %
Platelets: 246 10*3/uL (ref 150–400)
RBC: 4.48 MIL/uL (ref 4.22–5.81)
RDW: 13.2 % (ref 11.5–15.5)
WBC: 6.4 10*3/uL (ref 4.0–10.5)
nRBC: 0 % (ref 0.0–0.2)

## 2023-05-26 LAB — BASIC METABOLIC PANEL WITH GFR
Anion gap: 8 (ref 5–15)
BUN: 17 mg/dL (ref 8–23)
CO2: 25 mmol/L (ref 22–32)
Calcium: 8.7 mg/dL — ABNORMAL LOW (ref 8.9–10.3)
Chloride: 102 mmol/L (ref 98–111)
Creatinine, Ser: 1.25 mg/dL — ABNORMAL HIGH (ref 0.61–1.24)
GFR, Estimated: 57 mL/min — ABNORMAL LOW (ref >60)
Glucose, Bld: 175 mg/dL — ABNORMAL HIGH (ref 70–99)
Potassium: 4 mmol/L (ref 3.5–5.1)
Sodium: 135 mmol/L (ref 135–145)

## 2023-05-26 MED ORDER — NITROGLYCERIN 0.4 MG SL SUBL: 0.4000 mg | SUBLINGUAL_TABLET | SUBLINGUAL | Status: DC | PRN

## 2023-05-26 MED ORDER — POLYVINYL ALCOHOL 1.4 % OP SOLN
2.0000 [drp] | Freq: Two times a day (BID) | OPHTHALMIC | Status: DC
  Administered 2023-05-27: 2 [drp] via OPHTHALMIC
  Filled 2023-05-26: qty 15

## 2023-05-26 MED ORDER — FUROSEMIDE 20 MG PO TABS
40.0000 mg | ORAL_TABLET | Freq: Every day | ORAL | Status: DC
  Administered 2023-05-27: 40 mg via ORAL
  Filled 2023-05-26: qty 2

## 2023-05-26 MED ORDER — ONDANSETRON HCL 4 MG/2ML IJ SOLN: 4.0000 mg | Freq: Four times a day (QID) | INTRAMUSCULAR | Status: DC | PRN

## 2023-05-26 MED ORDER — LATANOPROST 0.005 % OP SOLN
1.0000 [drp] | Freq: Every day | OPHTHALMIC | Status: DC
  Filled 2023-05-26: qty 2.5

## 2023-05-26 MED ORDER — ALLOPURINOL 100 MG PO TABS: 100.0000 mg | ORAL_TABLET | Freq: Every day | ORAL | Status: DC

## 2023-05-26 MED ORDER — CARVEDILOL 3.125 MG PO TABS
3.1250 mg | ORAL_TABLET | Freq: Two times a day (BID) | ORAL | Status: DC
  Administered 2023-05-26 – 2023-05-27 (×2): 3.125 mg via ORAL
  Filled 2023-05-26 (×2): qty 1

## 2023-05-26 MED ORDER — OXYCODONE HCL 5 MG PO TABS: 5.0000 mg | ORAL_TABLET | Freq: Three times a day (TID) | ORAL | Status: DC | PRN

## 2023-05-26 MED ORDER — POLYETHYL GLYCOL-PROPYL GLYCOL 0.4-0.3 % OP SOLN: 1.0000 [drp] | Freq: Every day | OPHTHALMIC | Status: DC

## 2023-05-26 MED ORDER — ROSUVASTATIN CALCIUM 5 MG PO TABS
5.0000 mg | ORAL_TABLET | Freq: Every day | ORAL | Status: DC
  Administered 2023-05-27: 5 mg via ORAL
  Filled 2023-05-26: qty 1

## 2023-05-26 MED ORDER — POTASSIUM CHLORIDE CRYS ER 10 MEQ PO TBCR
10.0000 meq | EXTENDED_RELEASE_TABLET | Freq: Every day | ORAL | Status: DC
  Administered 2023-05-27: 10 meq via ORAL
  Filled 2023-05-26 (×2): qty 1

## 2023-05-26 MED ORDER — LEVOTHYROXINE SODIUM 25 MCG PO TABS
125.0000 ug | ORAL_TABLET | Freq: Every day | ORAL | Status: DC
  Administered 2023-05-27: 125 ug via ORAL
  Filled 2023-05-26: qty 1

## 2023-05-26 MED ORDER — ACETAMINOPHEN 325 MG PO TABS: 650.0000 mg | ORAL_TABLET | ORAL | Status: DC | PRN

## 2023-05-26 NOTE — Telephone Encounter (Signed)
Renee made aware. Renee to call pt today. Of note, after speaking to daughter of pt the pt contacted laurie short w/ c/o pain and plan. RU made aware.

## 2023-05-26 NOTE — Telephone Encounter (Signed)
Spoke w/ pts daughter, Denny Peon. From her observations the site appears to have increased in size since their visit 9/12 w/ RU. Pt reports increased discomfort from 9/12 in office visit. CT completed. Following up with RU in relation to plan going forward.

## 2023-05-26 NOTE — ED Provider Triage Note (Signed)
Emergency Medicine Provider Triage Evaluation Note  [name] , a 82 y.o. male  was evaluated in triage.  Pt complains of pacemaker pocket swelling.  This began approximately 3 weeks ago.  Became more painful yesterday.  No fevers or chills, nausea or vomiting.  Mild shortness of breath.  Was sent by his cardiology office for further evaluation.  Review of Systems  Positive: As above Negative: As above  Physical Exam  BP 115/71 (BP Location: Right Arm)   Pulse 80   Temp 97.7 F (36.5 C) (Oral)   Resp (!) 22   Ht 5\' 10"  (1.778 m)   Wt 102 kg   SpO2 98%   BMI 32.27 kg/m  Gen:   Awake, no distress   Resp:  Normal effort  MSK:   Moves extremities without difficulty  Other:  Significant swelling to the pacemaker pocket on the left upper chest  Medical Decision Making  Medically screening exam initiated at 2:33 PM.  Appropriate orders placed.  [name] was informed that the remainder of the evaluation will be completed by another provider, this initial triage assessment does not replace that evaluation, and the importance of remaining in the ED until their evaluation is complete.  Workup initiated   Michelle Piper, Cordelia Poche 05/26/23 1435

## 2023-05-26 NOTE — Telephone Encounter (Signed)
Patient is in ER and Luster Landsberg was made aware per Baird Lyons.

## 2023-05-26 NOTE — ED Triage Notes (Signed)
Patient arrives ambulatory by POV c/o left upper chest swelling around his [place] scientific site x 3-4 weeks.

## 2023-05-26 NOTE — ED Provider Notes (Signed)
[place] EMERGENCY DEPARTMENT AT [facility] Provider Note  CSN: [id number] Arrival date & time: 05/26/23 1418  Chief Complaint(s) ICD site swelling  HPI [name] is a 82 y.o. male with history of AICD, CHF, coronary artery disease presenting the emergency department with AICD site swelling.  Has been swelling up for 3 weeks.  Has been more painful.  No fevers, chills, nausea, vomiting, chest pain, shortness of breath.  Site swelling worsened today so came in to be admitted.   Past Medical History Past Medical History:  Diagnosis Date   AICD (automatic cardioverter/defibrillator) present    Arthritis    knees, back    BPH (benign prostatic hypertrophy)    CHF (congestive heart failure) (HCC)    Chronic kidney disease    BPH   Chronic systolic heart failure (HCC)    a. s/p MDT single chamber ICD 2004 as part of MASTER study b. upgrade to CRTD 2010; CRTD gen change 2016   Coronary artery disease    a. s/p anterior MI and CYPHER stent to LAD 2005   Dyslipidemia    Dysrhythmia    a-fib   Gout    Ischemic cardiomyopathy    Paroxysmal atrial fibrillation (HCC)    Ventricular tachycardia [facility])    Patient Active Problem List   Diagnosis Date Noted   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   Controlled type 2 diabetes mellitus without complication (HCC) 10/14/2022   Primary osteoarthritis of left knee 10/06/2021   Ischemic cardiomyopathy 01/19/2013   Biventricular implantable cardioverter-defibrillator in situ 01/28/2012   Anemia 11/06/2011   Chronic systolic CHF (congestive heart failure) (HCC)    Coronary artery disease    Dyslipidemia    Gout    Long term (current) use of anticoagulants 12/18/2010   Persistent atrial fibrillation (HCC) 02/25/2010   ESSENTIAL HYPERTENSION, BENIGN 01/14/2009   Home Medication(s) Prior to Admission medications   Medication Sig Start Date End Date Taking? Authorizing Provider  allopurinol  (ZYLOPRIM) 100 MG tablet Take 100 mg by mouth at bedtime. 05/14/15  Yes [provider]  carvedilol (COREG) 3.125 MG tablet TAKE 1 TABLET TWICE DAILY 06/15/22  Yes Nahser, Deloris Ping, MD  ELIQUIS 5 MG TABS tablet TAKE 1 TABLET TWICE DAILY 04/26/23  Yes Nahser, Deloris Ping, MD  furosemide (LASIX) 40 MG tablet Take 1 tablet (40 mg total) by mouth daily. 09/29/22  Yes Duke Salvia, MD  latanoprost (XALATAN) 0.005 % ophthalmic solution Place 1 drop into both eyes at bedtime.  10/03/13  Yes [provider]  levothyroxine (SYNTHROID) 125 MCG tablet Take 125 mcg by mouth daily before breakfast.   Yes [provider]  nitroGLYCERIN (NITROSTAT) 0.4 MG SL tablet Place 1 tablet (0.4 mg total) under the tongue every 5 (five) minutes as needed for chest pain. 12/12/19  Yes Nahser, Deloris Ping, MD  Polyethyl Glycol-Propyl Glycol 0.4-0.3 % SOLN Place 1 drop into both eyes daily.   Yes [provider]  potassium chloride (KLOR-CON) 10 MEQ tablet Take 1 tablet (10 mEq total) by mouth daily. Patient taking differently: Take 10 mEq by mouth 2 (two) times daily. 09/29/22  Yes Duke Salvia, MD  rosuvastatin (CRESTOR) 10 MG tablet TAKE 1/2 TABLET EVERY DAY 03/24/23  Yes Nahser, Deloris Ping, MD  TURMERIC PO Take 1 tablet by mouth daily in the afternoon.   Yes [provider]  Lysine 1000 MG TABS Take 1 tablet by mouth daily in the afternoon. Patient not taking: Reported  on 05/26/2023    [provider]                                                                                                                                    Past Surgical History Past Surgical History:  Procedure Laterality Date   APPENDECTOMY  1970   BI-VENTRICULAR IMPLANTABLE CARDIOVERTER DEFIBRILLATOR UPGRADE N/A 11/07/2014   a. MDT single chamber ICD implanted 2004 as part of MASTER study; upgrade to CRTD 2010; gen change 2016   BIV ICD GENERATOR CHANGEOUT N/A 11/12/2021   Procedure: BIV ICD GENERATOR  CHANGEOUT;  Surgeon: Duke Salvia, MD;  Location: [facility] INVASIVE CV LAB;  Service: Cardiovascular;  Laterality: N/A;   CARDIAC CATHETERIZATION  09/08/2003   x3 total of 5 stents   CARDIOVERSION N/A 07/04/2019   Procedure: CARDIOVERSION;  Surgeon: Wendall Stade, MD;  Location: [facility] ENDOSCOPY;  Service: Cardiovascular;  Laterality: N/A;   CARDIOVERSION N/A 08/17/2019   Procedure: CARDIOVERSION;  Surgeon: Vesta Mixer, MD;  Location: [facility] ENDOSCOPY;  Service: Cardiovascular;  Laterality: N/A;   CARDIOVERSION N/A 05/04/2022   Procedure: CARDIOVERSION;  Surgeon: Quintella Reichert, MD;  Location: [facility] ENDOSCOPY;  Service: Cardiovascular;  Laterality: N/A;   PROSTATECTOMY  11/06/2011   Procedure: PROSTATECTOMY SUPRAPUBIC;  Surgeon: Kathi Ludwig, MD;  Location: [facility] ORS;  Service: Urology;  Laterality: N/A;  Open Suprapubic Prostatectomy   TEE WITHOUT CARDIOVERSION N/A 05/16/2019   Procedure: TRANSESOPHAGEAL ECHOCARDIOGRAM (TEE);  Surgeon: Sande Rives, MD;  Location: [facility] ENDOSCOPY;  Service: Cardiology;  Laterality: N/A;   TOTAL KNEE ARTHROPLASTY Left 10/06/2021   Procedure: TOTAL KNEE ARTHROPLASTY;  Surgeon: Jodi Geralds, MD;  Location: [facility] ORS;  Service: Orthopedics;  Laterality: Left;   Family History Family History  Problem Relation Age of Onset   Heart disease Sister        3 63f the 4 had HD   Heart disease Brother        2 of 3 had HD   Heart disease Sister        1 of the 6 has HD   Heart disease Brother     Social History Social History   Tobacco Use   Smoking status: Never    Passive exposure: Never   Smokeless tobacco: Never  Vaping Use   Vaping status: Never Used  Substance Use Topics   Alcohol use: No    Comment: occasional beer    Drug use: No   Allergies Lipitor [atorvastatin] and Jardiance [empagliflozin]  Review of Systems Review of Systems  All other systems reviewed and are negative.   Physical Exam Vital Signs  I have reviewed the triage vital  signs BP 115/71 (BP Location: Right Arm)   Pulse 80   Temp 97.7 F (36.5 C) (Oral)   Resp (!) 22   Ht 5\' 10"  (1.778 m)   Wt 102 kg   SpO2 98%  BMI 32.27 kg/m  Physical Exam Vitals and nursing note reviewed.  Constitutional:      General: He is not in acute distress.    Appearance: Normal appearance.  HENT:     Mouth/Throat:     Mouth: Mucous membranes are moist.  Eyes:     Conjunctiva/sclera: Conjunctivae normal.  Cardiovascular:     Rate and Rhythm: Normal rate and regular rhythm.  Pulmonary:     Effort: Pulmonary effort is normal. No respiratory distress.     Breath sounds: Normal breath sounds.  Chest:     Comments: Left upper chest pacemaker site with significant soft tissue swelling, no real appreciable warmth, erythema Abdominal:     General: Abdomen is flat.     Palpations: Abdomen is soft.     Tenderness: There is no abdominal tenderness.  Musculoskeletal:     Right lower leg: No edema.     Left lower leg: No edema.     Comments: Chronic wound to the right posterior calf, approximately 1 x 1 cm, no surrounding warmth or tenderness  Skin:    General: Skin is warm and dry.     Capillary Refill: Capillary refill takes less than 2 seconds.  Neurological:     Mental Status: He is alert and oriented to person, place, and time. Mental status is at baseline.  Psychiatric:        Mood and Affect: Mood normal.        Behavior: Behavior normal.     ED Results and Treatments Labs (all labs ordered are listed, but only abnormal results are displayed) Labs Reviewed  BASIC METABOLIC PANEL - Abnormal; Notable for the following components:      Result Value   Glucose, Bld 175 (*)    Creatinine, Ser 1.25 (*)    Calcium 8.7 (*)    GFR, Estimated 57 (*)    All other components within normal limits  CULTURE, BLOOD (ROUTINE X 2)  CULTURE, BLOOD (ROUTINE X 2)  CBC WITH DIFFERENTIAL/PLATELET                                                                                                                           Radiology UE Venous Duplex ([facility] and [facility] ONLY)  Result Date: 05/26/2023 UPPER VENOUS STUDY  Patient Name:  KEAVON MCVOY Oliger  Date of Exam:   05/26/2023 Medical Rec #: [id number]       Accession #:    [id number] Date of Birth: Mar 12, 1941       Patient Gender: M Patient Age:   88 years Exam Location:  [facility] Procedure:      VAS [place] UPPER EXTREMITY VENOUS DUPLEX Referring Phys: Lyn Hollingshead SCHUTT --------------------------------------------------------------------------------  Indications: Swelling Other Indications: Assess for fistula. Risk Factors: Obesity. Comparison Study: No prior study Performing Technologist: Shona Simpson  Examination Guidelines: A complete evaluation includes B-mode imaging, spectral Doppler, color Doppler, and power Doppler as needed of all accessible portions of each vessel.  Bilateral testing is considered an integral part of a complete examination. Limited examinations for reoccurring indications may be performed as noted.  Right Findings: +----------+------------+---------+-----------+----------+-------+ RIGHT     CompressiblePhasicitySpontaneousPropertiesSummary +----------+------------+---------+-----------+----------+-------+ Subclavian    Full       Yes       Yes                      +----------+------------+---------+-----------+----------+-------+  Left Findings: +----------+------------+---------+-----------+----------+-------+ LEFT      CompressiblePhasicitySpontaneousPropertiesSummary +----------+------------+---------+-----------+----------+-------+ IJV           Full       Yes       Yes                      +----------+------------+---------+-----------+----------+-------+ Subclavian    Full       Yes       Yes                      +----------+------------+---------+-----------+----------+-------+ Axillary      Full       Yes       Yes                       +----------+------------+---------+-----------+----------+-------+ Brachial      Full       Yes       Yes                      +----------+------------+---------+-----------+----------+-------+ Radial        Full       Yes       Yes                      +----------+------------+---------+-----------+----------+-------+ Ulnar         Full       Yes       Yes                      +----------+------------+---------+-----------+----------+-------+ Cephalic      Full       Yes       Yes                      +----------+------------+---------+-----------+----------+-------+ Basilic       Full       Yes       Yes                      +----------+------------+---------+-----------+----------+-------+ Avscular mass seen in anterior left shoulder/pectoral region  Summary:  Right: No evidence of thrombosis in the subclavian.  Left: No evidence of deep vein thrombosis in the upper extremity. No evidence of superficial vein thrombosis in the upper extremity.  *See table(s) above for measurements and observations.    Preliminary    DG Chest 2 View  Result Date: 05/26/2023 CLINICAL DATA:  Pacemaker pocket swelling EXAM: CHEST - 2 VIEW COMPARISON:  05/24/2023 FINDINGS: Frontal and lateral views of the chest demonstrate multi lead pacer/AICD within the left anterior chest wall, proximal lead within the region the right atrium, distal lead in the region of the right ventricle, and additional lead in the region the coronary sinus. Cardiac silhouette is unremarkable. No airspace disease, effusion, or pneumothorax. Bibasilar scarring unchanged. No acute bony abnormalities. IMPRESSION: 1. Stable appearance of the multi lead pacer/AICD. 2. No acute intrathoracic process. Electronically Signed   By: Sharlet Salina  M.D.   On: 05/26/2023 16:08    Pertinent labs & imaging results that were available during my care of the patient were reviewed by me and considered in my medical decision making (see MDM  for details).  Medications Ordered in ED Medications  acetaminophen (TYLENOL) tablet 650 mg (has no administration in time range)  ondansetron (ZOFRAN) injection 4 mg (has no administration in time range)  nitroGLYCERIN (NITROSTAT) SL tablet 0.4 mg (has no administration in time range)  oxyCODONE (Oxy IR/ROXICODONE) immediate release tablet 5 mg (has no administration in time range)  allopurinol (ZYLOPRIM) tablet 100 mg (has no administration in time range)  carvedilol (COREG) tablet 3.125 mg (has no administration in time range)  furosemide (LASIX) tablet 40 mg (has no administration in time range)  rosuvastatin (CRESTOR) tablet 5 mg (has no administration in time range)  levothyroxine (SYNTHROID) tablet 125 mcg (has no administration in time range)  potassium chloride (KLOR-CON) CR tablet 10 mEq (has no administration in time range)  latanoprost (XALATAN) 0.005 % ophthalmic solution 1 drop (has no administration in time range)  Polyethyl Glycol-Propyl Glycol 0.4-0.3 % SOLN 1 drop (has no administration in time range)                                                                                                                                     Procedures Procedures  (including critical care time)  Medical Decision Making / ED Course   MDM:  82 year old male presenting to the emergency department with pacemaker site swelling.  Patient overall well-appearing, physical exam with pacemaker site swelling, no real warmth or erythema, no drainage.  Unclear cause.  Plan per his cardiology team is to admit him to further evaluate this.  Patient appears euvolemic currently.  He is not febrile or septic appearing.  Blood cultures were obtained in triage.  Seems less likely to be an infection but difficult to really tell.  Patient has been admitted by cardiology for further workup.      Additional history obtained: -Additional history obtained from family -External records from  outside source obtained and reviewed including: Chart review including previous notes, labs, imaging, consultation notes including cardiology notes   Lab Tests: -I ordered, reviewed, and interpreted labs.   The pertinent results include:   Labs Reviewed  BASIC METABOLIC PANEL - Abnormal; Notable for the following components:      Result Value   Glucose, Bld 175 (*)    Creatinine, Ser 1.25 (*)    Calcium 8.7 (*)    GFR, Estimated 57 (*)    All other components within normal limits  CULTURE, BLOOD (ROUTINE X 2)  CULTURE, BLOOD (ROUTINE X 2)  CBC WITH DIFFERENTIAL/PLATELET    Notable for borderline AKI, normal CBC   Imaging Studies ordered: I ordered imaging studies including CXR On my interpretation imaging demonstrates no acute process I independently visualized and interpreted imaging. I agree with  the radiologist interpretation   Medicines ordered and prescription drug management: Meds ordered this encounter  Medications   acetaminophen (TYLENOL) tablet 650 mg   ondansetron (ZOFRAN) injection 4 mg   nitroGLYCERIN (NITROSTAT) SL tablet 0.4 mg   oxyCODONE (Oxy IR/ROXICODONE) immediate release tablet 5 mg   allopurinol (ZYLOPRIM) tablet 100 mg   carvedilol (COREG) tablet 3.125 mg   furosemide (LASIX) tablet 40 mg   rosuvastatin (CRESTOR) tablet 5 mg   levothyroxine (SYNTHROID) tablet 125 mcg   potassium chloride (KLOR-CON) CR tablet 10 mEq   latanoprost (XALATAN) 0.005 % ophthalmic solution 1 drop   Polyethyl Glycol-Propyl Glycol 0.4-0.3 % SOLN 1 drop    -I have reviewed the patients home medicines and have made adjustments as needed   Consultations Obtained: I requested consultation with the cardiologist,  and discussed lab and imaging findings as well as pertinent plan - they recommend: admission  Social Determinants of Health:  Diagnosis or treatment significantly limited by social determinants of health: obesity   Reevaluation: After the interventions noted  above, I reevaluated the patient and found that their symptoms have improved  Co morbidities that complicate the patient evaluation  Past Medical History:  Diagnosis Date   AICD (automatic cardioverter/defibrillator) present    Arthritis    knees, back    BPH (benign prostatic hypertrophy)    CHF (congestive heart failure) (HCC)    Chronic kidney disease    BPH   Chronic systolic heart failure (HCC)    a. s/p MDT single chamber ICD 2004 as part of MASTER study b. upgrade to CRTD 2010; CRTD gen change 2016   Coronary artery disease    a. s/p anterior MI and CYPHER stent to LAD 2005   Dyslipidemia    Dysrhythmia    a-fib   Gout    Ischemic cardiomyopathy    Paroxysmal atrial fibrillation (HCC)    Ventricular tachycardia (HCC)       Dispostion: Disposition decision including need for hospitalization was considered, and patient discharged from emergency department.    Final Clinical Impression(s) / ED Diagnoses Final diagnoses:  AICD problem     This chart was dictated using voice recognition software.  Despite best efforts to proofread,  errors can occur which can change the documentation meaning.    Lonell Grandchild, MD 05/26/23 [phone]

## 2023-05-26 NOTE — H&P (Addendum)
Cardiology Admission History and Physical   Patient ID: [name] MRN: [id number]; DOB: Mar 27, 1941   Admission date: 05/26/2023  PCP:  Kaleen Mask, MD   [facility] Providers Cardiologist:  Kristeen Miss, MD  Electrophysiologist:  Sherryl Manges, MD  {   Chief Complaint:  ICD pocket swelling and discomfort  Patient Profile:   [name] is a 82 y.o. male with history of CAD, chronic CHF (systolic), ICD, BPH, HLD, gout, AFib,  who is being seen 05/26/2023 for the evaluation of increasing swelling and discomfort at his ICD site.  Device information MDT CRT-D, gen change 11/07/2014, gen change 11/12/21 now with a BSci generator RA lead 2010 RV lead 2004 LV lead 2010   10/19/22 : DNR >> HV tx OFF  History of Present Illness:   Mr. Brading was seen by myself recently with c/o welling at his ICD site, initially slightly uncomfortable, planned or chest wall [place] and BC, after discussion with Dr Graciela Husbands, brought in a couple days later 05/20/23 for him and Dr. Ladona Ridgel to evaluate.  Both felt most likely infection, though no systemic symptoms high supicion/most likely cause was infection Dr. Ladona Ridgel suggested CT scan using lead extraction protocol to better evaluate vasculature. No bruit appreciated  CT resulted and reviewed yesterday (still pending radiologist read, cardiology portion noted)  The patient daughter called today that he was having increasing pain/discomfort at the site and advised to come to the ER for further management and admission to help push the w/u further  No CP, he does mention today feeling a bit nauseous, though no symptoms of fever or systemic illness. No SOB He did feel during the CT scan a brief acute CP at the pcoket site that resolved quickly  Outpt [facility] came back neg x5 days (x2) Though today he mentions that he was on antibiotic via the wound clinic (not on his med list on 05/18/23)  Has had trouble with intermittent b/l LE wounds that he  sees wound care for on ocassion  He also tells [place] about a  month ago he had a root canal.  Swelling started perhaps +/- about that time.  Amoxicillin appears now on his home meds list, seems from June, ? On chronically  Labs are drawn and pending       Past Medical History:  Diagnosis Date   AICD (automatic cardioverter/defibrillator) present    Arthritis    knees, back    BPH (benign prostatic hypertrophy)    CHF (congestive heart failure) (HCC)    Chronic kidney disease    BPH   Chronic systolic heart failure (HCC)    a. s/p MDT single chamber ICD 2004 as part of MASTER study b. upgrade to CRTD 2010; CRTD gen change 2016   Coronary artery disease    a. s/p anterior MI and CYPHER stent to LAD 2005   Dyslipidemia    Dysrhythmia    a-fib   Gout    Ischemic cardiomyopathy    Paroxysmal atrial fibrillation (HCC)    Ventricular tachycardia (HCC)     Past Surgical History:  Procedure Laterality Date   APPENDECTOMY  1970   BI-VENTRICULAR IMPLANTABLE CARDIOVERTER DEFIBRILLATOR UPGRADE N/A 11/07/2014   a. MDT single chamber ICD implanted 2004 as part of MASTER study; upgrade to CRTD 2010; gen change 2016   BIV ICD GENERATOR CHANGEOUT N/A 11/12/2021   Procedure: BIV ICD GENERATOR CHANGEOUT;  Surgeon: [facility] Salvia, MD;  Location: [name] INVASIVE CV LAB;  Service: Cardiovascular;  Laterality: N/A;   CARDIAC CATHETERIZATION  09/08/2003   x3 total of 5 stents   CARDIOVERSION N/A 07/04/2019   Procedure: CARDIOVERSION;  Surgeon: Wendall Stade, MD;  Location: [facility] ENDOSCOPY;  Service: Cardiovascular;  Laterality: N/A;   CARDIOVERSION N/A 08/17/2019   Procedure: CARDIOVERSION;  Surgeon: Vesta Mixer, MD;  Location: [facility] ENDOSCOPY;  Service: Cardiovascular;  Laterality: N/A;   CARDIOVERSION N/A 05/04/2022   Procedure: CARDIOVERSION;  Surgeon: Quintella Reichert, MD;  Location: [facility] ENDOSCOPY;  Service: Cardiovascular;  Laterality: N/A;   PROSTATECTOMY  11/06/2011   Procedure: PROSTATECTOMY  SUPRAPUBIC;  Surgeon: Kathi Ludwig, MD;  Location: [facility] ORS;  Service: Urology;  Laterality: N/A;  Open Suprapubic Prostatectomy   TEE WITHOUT CARDIOVERSION N/A 05/16/2019   Procedure: TRANSESOPHAGEAL ECHOCARDIOGRAM (TEE);  Surgeon: Sande Rives, MD;  Location: [facility] ENDOSCOPY;  Service: Cardiology;  Laterality: N/A;   TOTAL KNEE ARTHROPLASTY Left 10/06/2021   Procedure: TOTAL KNEE ARTHROPLASTY;  Surgeon: Jodi Geralds, MD;  Location: [facility] ORS;  Service: Orthopedics;  Laterality: Left;     Medications Prior to Admission: Prior to Admission medications   Medication Sig Start Date End Date Taking? Authorizing Provider  allopurinol (ZYLOPRIM) 100 MG tablet Take 100 mg by mouth at bedtime. 05/14/15   [provider]  amoxicillin (AMOXIL) 500 MG capsule Take 500 mg by mouth 2 (two) times daily. 02/22/23   [provider]  carvedilol (COREG) 3.125 MG tablet TAKE 1 TABLET TWICE DAILY 06/15/22   Nahser, Deloris Ping, MD  ELIQUIS 5 MG TABS tablet TAKE 1 TABLET TWICE DAILY 04/26/23   Nahser, Deloris Ping, MD  furosemide (LASIX) 40 MG tablet Take 1 tablet (40 mg total) by mouth daily. 09/29/22   [facility] Salvia, MD  latanoprost (XALATAN) 0.005 % ophthalmic solution Place 1 drop into both eyes at bedtime.  10/03/13   [provider]  levothyroxine (SYNTHROID) 125 MCG tablet Take 125 mcg by mouth daily before breakfast.    [provider]  Lysine 1000 MG TABS Take 1 tablet by mouth daily in the afternoon.    [provider]  nitroGLYCERIN (NITROSTAT) 0.4 MG SL tablet Place 1 tablet (0.4 mg total) under the tongue every 5 (five) minutes as needed for chest pain. 12/12/19   Nahser, Deloris Ping, MD  Polyethyl Glycol-Propyl Glycol 0.4-0.3 % SOLN Place 1 drop into both eyes daily.    [provider]  potassium chloride (KLOR-CON) 10 MEQ tablet Take 1 tablet (10 mEq total) by mouth daily. 09/29/22   [facility] Salvia, MD  rosuvastatin (CRESTOR) 10 MG tablet TAKE 1/2 TABLET  EVERY DAY 03/24/23   Nahser, Deloris Ping, MD  TURMERIC PO Take 1 tablet by mouth daily in the afternoon.    [provider]     Allergies:    Allergies  Allergen Reactions   Lipitor [Atorvastatin] Other (See Comments)    MUSCLE ACHE    Jardiance [Empagliflozin] Other (See Comments)    Intolerance     Social History:   Social History   Socioeconomic History   Marital status: Married    Spouse name: Not on file   Number of children: Not on file   Years of education: Not on file   Highest education level: Not on file  Occupational History   Not on file  Tobacco Use   Smoking status: Never    Passive exposure: Never   Smokeless tobacco: Never  Vaping Use   Vaping status: Never Used  Substance and Sexual Activity   Alcohol use: No    Comment: occasional beer    Drug use: No   Sexual activity: Not on file  Other Topics Concern   Not on file  Social History Narrative   Not on file   Social Determinants of Health   Financial Resource Strain: Not on file  Food Insecurity: No Food Insecurity (10/14/2022)   Hunger Vital Sign    Worried About Running Out of Food in the Last Year: Never true    Ran Out of Food in the Last Year: Never true  Transportation Needs: Not on file  Physical Activity: Not on file  Stress: Not on file  Social Connections: Not on file  Intimate Partner Violence: Not on file    Family History:   The patient's family history includes Heart disease in his brother, brother, sister, and sister.    ROS:  Please see the history of present illness.  All other ROS reviewed and negative.     Physical Exam/Data:   Vitals:   05/26/23 1425 05/26/23 1428  BP: 115/71   Pulse: 80   Resp: (!) 22   Temp: 97.7 F (36.5 C)   TempSrc: Oral   SpO2: 98%   Weight:  102 kg  Height:  5\' 10"  (1.778 m)   No intake or output data in the 24 hours ending 05/26/23 1523    05/26/2023    2:28 PM 05/20/2023   12:58 PM 12/17/2022   12:10 PM  Last 3 Weights   Weight (lbs) 224 lb 13.9 oz 225 lb 12.8 oz 221 lb 6.4 oz  Weight (kg) 102 kg 102.422 kg 100.426 kg     Body mass index is 32.27 kg/m.  General:  Well nourished, well developed, in no acute distress HEENT: normal Neck: no JVD Vascular: No carotid bruits; Distal pulses 2+ bilaterally   Cardiac:  RRR; (paced) no murmurs, gallops or rubs Lungs:  CTA b/l, no wheezing, rhonchi or rales  Abd: soft, nontender, no hepatomegaly  Ext: no edema Musculoskeletal:  No deformities, Skin: warm and dry  Neuro:  no focal abnormalities noted Psych:  Normal affect   ICD site w/marked swelling, skin is intact, no thinning, no erythema, tenderness, not warm   EKG:  not done  Relevant CV Studies:  11/23/2019: TTE IMPRESSIONS  1. Left ventricular ejection fraction, by estimation, is 30 to 35%. The  left ventricle has moderately decreased function. The left ventricle  demonstrates regional wall motion abnormalities (see scoring  diagram/findings for description). The left  ventricular internal cavity size was mildly dilated. Left ventricular  diastolic parameters are consistent with Grade II diastolic dysfunction  (pseudonormalization). Elevated left ventricular end-diastolic pressure.   2. Right ventricular systolic function is normal. The right ventricular  size is normal.   3. The mitral valve is normal in structure. Mild mitral valve  regurgitation. No evidence of mitral stenosis.   4. The aortic valve is tricuspid. Aortic valve regurgitation is mild. No  aortic stenosis is present.   5. Aortic dilatation noted. There is mild dilatation of the ascending  aorta.   6. The inferior vena cava is normal in size with greater than 50%  respiratory variability, suggesting right atrial pressure of 3 mmHg.      12/20/2019: Stress myoview The left ventricular ejection fraction is severely decreased (<30%). Nuclear stress EF: 23%. There was no ST segment deviation noted during stress. There is a  large defect of severe severity present in  the basal anteroseptal, basal inferoseptal, mid anterior, mid anteroseptal, mid inferoseptal, apical anterior, apical septal and apex location. This consistent with prior infarct/scar. No ischemia. Findings consistent with prior myocardial infarction. This is a high risk study.    Laboratory Data:  High Sensitivity Troponin:  No results for input(s): "TROPONINIHS" in the last 720 hours.    Chemistry Recent Labs  Lab 05/21/23 1342  NA 141  K 4.4  CL 100  CO2 25  GLUCOSE 142*  BUN 18  CREATININE 1.06  CALCIUM 9.2    No results for input(s): "PROT", "ALBUMIN", "AST", "ALT", "ALKPHOS", "BILITOT" in the last 168 hours. Lipids No results for input(s): "CHOL", "TRIG", "HDL", "LABVLDL", "LDLCALC", "CHOLHDL" in the last 168 hours. HematologyNo results for input(s): "WBC", "RBC", "HGB", "HCT", "MCV", "MCH", "MCHC", "RDW", "PLT" in the last 168 hours. Thyroid No results for input(s): "TSH", "FREET4" in the last 168 hours. BNPNo results for input(s): "BNP", "PROBNP" in the last 168 hours.  DDimer No results for input(s): "DDIMER" in the last 168 hours.   Radiology/Studies:  No results found.   Assessment and Plan:   ICD pocket edema/swelling, discomfort CT reviewed with Dr. Lalla Brothers await radiology input Will get chest wall US done to see if this may help  understand cause of swelling Admit Pain control Will look to discuss with colleagues for further evaluation prior to any decisions about pocket debridement (for symptom relief mostly), system extraction, ? Suppressive antibiotics  Will need some kind of intervention Made triage PA aware of plans for admit Pt is aware I have previously discussed with his daughter as well  Has a couple potential etiologies for infection sources   CAD No CP Home meds  ICM Chronic CHF Does not appear volume OL Home meds  Permanent AFib Not dependent Last week in the office underlying AF  70's Will hold his eliquis For now no heparin until plan/etiology of swelling becomes clear  6. hypothyroid Home levothyroxine   Code status Discussed He is clear, DNR status His ICD tachy therapies are off    Risk Assessment/Risk Scores:   Code Status: Do Not Resuscitate (DNR)    For questions or updates, please contact [facility] Please consult [email or web address] for contact info under     Signed, Sheilah Pigeon, PA-C  05/26/2023 3:23 PM

## 2023-05-26 NOTE — Progress Notes (Signed)
Lower extremity venous duplex completed. Please see CV Procedures for preliminary results.  Shona Simpson, RVT 05/26/23 4:31 PM

## 2023-05-26 NOTE — Telephone Encounter (Signed)
Patient stated around he has some infection around his [employer] insert and he wants to know next steps.

## 2023-05-26 NOTE — Telephone Encounter (Signed)
Received call from patient.  He reports he is having a lot of pain around his device and waiting on answer back from the physician about what to do about his possibly infected device.  Advised his daughter has spoken with the device clinic this AM about this issue and waiting on response back from [place], [place]. He was hoping to get a call back soon.  Advised the device clinic will call him as soon as there are recommendations made from either Renee or one of the physicians.

## 2023-05-27 ENCOUNTER — Ambulatory Visit (HOSPITAL_COMMUNITY): Payer: Medicare PPO

## 2023-05-27 ENCOUNTER — Telehealth: Payer: Self-pay | Admitting: Internal Medicine

## 2023-05-27 DIAGNOSIS — I5022 Chronic systolic (congestive) heart failure: Secondary | ICD-10-CM | POA: Diagnosis not present

## 2023-05-27 DIAGNOSIS — R609 Edema, unspecified: Secondary | ICD-10-CM | POA: Diagnosis not present

## 2023-05-27 LAB — BASIC METABOLIC PANEL
Anion gap: 9 (ref 5–15)
BUN: 17 mg/dL (ref 8–23)
CO2: 24 mmol/L (ref 22–32)
Calcium: 8.3 mg/dL — ABNORMAL LOW (ref 8.9–10.3)
Chloride: 105 mmol/L (ref 98–111)
Creatinine, Ser: 1.12 mg/dL (ref 0.61–1.24)
GFR, Estimated: >60 mL/min (ref >60)
Glucose, Bld: 108 mg/dL — ABNORMAL HIGH (ref 70–99)
Potassium: 4 mmol/L (ref 3.5–5.1)
Sodium: 138 mmol/L (ref 135–145)

## 2023-05-27 MED ORDER — ACETAMINOPHEN 325 MG PO TABS: 650.0000 mg | ORAL_TABLET | ORAL | Status: AC | PRN

## 2023-05-27 NOTE — ED Notes (Signed)
Family updated as to patient's status.

## 2023-05-27 NOTE — ED Notes (Signed)
Providers at bedside.

## 2023-05-27 NOTE — ED Notes (Signed)
This Rn called [facility] to coordinate transport to [facility] and was not provided with an ETA.

## 2023-05-27 NOTE — Discharge Summary (Addendum)
ELECTROPHYSIOLOGY PROCEDURE DISCHARGE SUMMARY    Patient ID: [name],  MRN: [id number], DOB/AGE: Mar 02, 1941 82 y.o.  Admit date: 05/26/2023 Discharge date: 05/27/2023  Primary Care Physician: Kaleen Mask, MD  Primary Cardiologist: Dr. Elease Hashimoto Electrophysiologist: Dr. Graciela Husbands  Primary Discharge Diagnosis:  ICD pocket swelling   Secondary Discharge Diagnosis:  Permanent AFib CAD Chronic CHF (systolic) ICM    Allergies  Allergen Reactions   Lipitor [Atorvastatin] Other (See Comments)    MUSCLE ACHE    Jardiance [Empagliflozin] Other (See Comments)    Intolerance      Procedures This Admission:  none  Brief HPI: [name] is a 82 y.o. male was seen by myself recently with c/o welling at his ICD site, initially slightly uncomfortable, planned or chest wall [place] and BC, after discussion with Dr Graciela Husbands, brought in a couple days later 05/20/23 for him and Dr. Ladona Ridgel to evaluate.  Both felt most likely infection, though no systemic symptoms high supicion/most likely cause was infection Dr. Ladona Ridgel suggested CT scan using lead extraction protocol to better evaluate vasculature. No bruit appreciated CT resulted and reviewed  (still pending radiologist read, cardiology portion noted) The patient daughter called yesterday that he was having increasing pain/discomfort at the site and advised to come to the ER for further management and admission to help with pain management and further evaluate swelling   Hospital Course:  The patient was admitted labs OK, afebrile with stable vitals He reported feeling a bit nauseous yesterday though no other symptoms outside of increasing swelling and pocket pain.    Pocket swelling is marked, though not hot, erythematous, skin is intact and stable, minimally  tender.  Vacular ultrasound done withot help in noting any kind of vascular involvement. Chest wall [place] makes suggestion of hematoma.  Long discussion today with the  patient concners of the age of his lead (s), complexity of his lead extraction given dualk coil and lead position.  STill unclear exactly what the etiology of his swelling is.  Likely infection, though bleeding not ruled out.  Dr. Lalla Brothers discussed case with Dr. Vernard Gambles at [facility] and recommendation is to transfer to their service there for further management and plans for device extraction  Dr. Lalla Brothers discussed at length today with the patient concerns of technical complexity of his situation, and rational for transferring to [facility], he is in agreement. Also discussed during surgery procedure there his DNR status woould be revereted to full code for the procedure, he is in agreement with this as well  I updated his daughter via phone to the plan The patient feels well, denies any CP/SOB, with moderate pocket discomfort. He was examined by Dr. Lalla Brothers and considered stable for discharge to [facility] once a bed is available and transfer arranged (will be done via the [facility] team)  Home eliquis has been held for procedures, his last dose was yesterday (05/26/23) morning, ho heparin initiated given unclear source of his pocket swelling, unable to r/o hematoma, blood  Device information MDT CRT-D, gen change 11/07/2014, gen change 11/12/21 now with a BSci generator RA lead 2010 RV lead 2004 LV lead 2010   10/19/22 : DNR >> HV tx OFF   Physical Exam: Vitals:   05/27/23 0800 05/27/23 0915 05/27/23 1013 05/27/23 1231  BP: 138/78 129/79 117/71 130/86  Pulse: 80 79 81 79  Resp: 16 13  16   Temp:  98.7 F (37.1 C)  (!) 97.5 F (36.4 C)  TempSrc:  Oral  Oral  SpO2: 94% 98%  97%  Weight:      Height:        GEN- The patient is well appearing, alert and oriented x 3 today.   HEENT: normocephalic, atraumatic; sclera clear, conjunctiva pink; hearing intact; oropharynx clear; neck supple, no JVP Lungs- CTA b/l, normal work of breathing.  No wheezes, rales, rhonchi Heart- RRR, no murmurs, rubs or gallops, PMI  not laterally displaced GI- soft, non-tender, non-distended Extremities- no clubbing, cyanosis, or edema MS- no significant deformity or atrophy Skin- warm and dry, no rash or lesion,  left chest ICD pocket with marked swelling Psych- euthymic mood, full affect Neuro- no gross deficits   Labs:   Lab Results  Component Value Date   WBC 6.4 05/26/2023   HGB 13.9 05/26/2023   HCT 43.2 05/26/2023   MCV 96.4 05/26/2023   PLT 246 05/26/2023    Recent Labs  Lab 05/27/23 0526  NA 138  K 4.0  CL 105  CO2 24  BUN 17  CREATININE 1.12  CALCIUM 8.3*  GLUCOSE 108*    Discharge Medications:  Allergies as of 05/27/2023       Reactions   Lipitor [atorvastatin] Other (See Comments)   MUSCLE ACHE    Jardiance [empagliflozin] Other (See Comments)   Intolerance        Medication List     STOP taking these medications    Eliquis 5 MG Tabs tablet Generic drug: apixaban   Lysine 1000 MG Tabs   TURMERIC PO       TAKE these medications    acetaminophen 325 MG tablet Commonly known as: TYLENOL Take 2 tablets (650 mg total) by mouth every 4 (four) hours as needed for headache or mild pain.   allopurinol 100 MG tablet Commonly known as: ZYLOPRIM Take 100 mg by mouth at bedtime.   carvedilol 3.125 MG tablet Commonly known as: COREG TAKE 1 TABLET TWICE DAILY   furosemide 40 MG tablet Commonly known as: LASIX Take 1 tablet (40 mg total) by mouth daily.   latanoprost 0.005 % ophthalmic solution Commonly known as: XALATAN Place 1 drop into both eyes at bedtime.   levothyroxine 125 MCG tablet Commonly known as: SYNTHROID Take 125 mcg by mouth daily before breakfast.   nitroGLYCERIN 0.4 MG SL tablet Commonly known as: NITROSTAT Place 1 tablet (0.4 mg total) under the tongue every 5 (five) minutes as needed for chest pain.   Polyethyl Glycol-Propyl Glycol 0.4-0.3 % Soln Place 1 drop into both eyes daily.   potassium chloride 10 MEQ tablet Commonly known as:  KLOR-CON Take 1 tablet (10 mEq total) by mouth daily. What changed: when to take this   rosuvastatin 10 MG tablet Commonly known as: CRESTOR TAKE 1/2 TABLET EVERY DAY        Disposition: [facility], Dr. Vernard Gambles service   Discharge Instructions     Diet - low sodium heart healthy   Complete by: As directed    Increase activity slowly   Complete by: As directed    No wound care   Complete by: As directed         Duration of Discharge Encounter: Greater than 30 minutes including physician time.  Norma Fredrickson, PA-C 05/27/2023 2:06 PM

## 2023-05-27 NOTE — Telephone Encounter (Signed)
Patient's daughter states the patient was supposed to be transferred to [facility] but is still in an ED holding room. She would like to inform Francis [place]se, [place]. She would like to know if anything can be done to speed up the process of having the patient transferred.

## 2023-05-27 NOTE — ED Notes (Addendum)
This RN assumed care of patient and received off going transfer of care report from off going RN. Pt presented to the ED with swelling around ICD from home. Pt is resting on gurney at this time, respirations are spontaneous, even, unlabored and symmetrical bilaterally. Pt skin tone is appropriate for ethnicity, dry and warm. Pt connected to CCM, pulse ox and BP. Call bell within reach and bed in lowest position.

## 2023-05-27 NOTE — Consult Note (Addendum)
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  Seen at wound care center.  Last visit 05/04/23.  New area to right posterior leg.  Wound type: venous Pressure Injury POA: NA Measurement: 2 cm x 1 cm x 0.2 cm left leg 1 cm x 1.5 cm x 0.1 cm right posterior leg Wound EXB:MWUX and moist Drainage (amount, consistency, odor) minimal serosanguinous   Periwound: edema to left leg Dressing procedure/placement/frequency: Bedside RN to apply: Cleanse bilateral legs with soap and water and pat dry. Apply aquacel [facility] # [id number]) to open wounds.  Cover with gauze. Wrap left left leg with kerlix from below toes to below knee.  Secure with self adherent coban.  Change weekly on Thursday.  Will not follow at this time.  Please re-consult if needed.  Mike Gip MSN, RN, FNP-BC CWON Wound, Ostomy, Continence Nurse Outpatient [facility] [phone] Pager [phone]

## 2023-05-31 LAB — CULTURE, BLOOD (ROUTINE X 2)
Culture: NO GROWTH
Culture: NO GROWTH
Special Requests: ADEQUATE

## 2023-06-03 ENCOUNTER — Telehealth: Payer: Self-pay

## 2023-06-03 NOTE — Telephone Encounter (Signed)
Received call from daughter and she reports patient had device explanted at [facility] and should be discharged today, 9/26.  She asked that his meds be placed on hold that pertain to his device.   She reports she does not want her father to have a bunch of meds that he does not need to be sent to his home.  Encouraged to call the pharmacy to advise which meds to hold if needed depending on which med [facility] has instructed him to take or stop.  She said she is unable to do that due to she is feeling overwhelmed trying to take care of everything and needs assistance. Advised will send to Dr Odessa Fleming nurse for follow up.   Sent to Dr Odessa Fleming nurse to follow up.

## 2023-06-04 ENCOUNTER — Encounter: Payer: Self-pay | Admitting: Internal Medicine

## 2023-06-10 NOTE — Telephone Encounter (Signed)
I called and spoke with the patient's daughter and gave her instructions for how we would have managed the wound had been done here.  Follow-up as scheduled

## 2023-06-12 NOTE — Progress Notes (Unsigned)
Cardiology Office Note Date:  06/12/2023  Patient ID:  [name], [name] [birth year], MRN [id number] PCP:  Kaleen Mask, MD  Cardiologist:  Dr. Elease Hashimoto EP: Dr. Graciela Husbands    Chief Complaint:    *** post op  History of Present Illness: [name] is a 82 y.o. male with history of CAD, chronic CHF (systolic), ICD, BPH,  HLD, gout, AFib.  I saw him in September 10 and 12 Seen in conjunction with Drs Ladona Ridgel and Graciela Husbands, both suspected pocket infection though felt investigation to any potential vascular etiology felt needed  >> CT   [facility] drawn, neg (x2) for 5 days Had been actively on amoxil it seems for some months for his LE wounds  Ended up with progressive pocket swelling pain . Perhaps some vague systemic symptoms of mild nausea, sense of feeling unwell nonspecifically > admitted to [facility] 05/26/23 > transferred to [facility] 05/27/23 for system extraction give age of the system and particularly RV lead position (prox coil at SCV junction)  06/01/23: ICD system extraction by Dr. Vernard Gambles Treated with vancomycin  Discharged 06/03/23 Back on his eliquis On Linezolid (10 days) No re-implant  9/26: WBC 11.2 H/H 13/41 Plts 236 K+ 4.1 BUN/Creat 20/1.1  Tissue culture 06/01/23:  Culture Tissue No aerobic or anaerobic growth  Gram Stain No organisms seen  Gram Stain Rare White blood cells   *** CBC *** ID follow up? *** rates, symptoms, brady? *** new implant CRT-P? *** notes that daughter was confused about his DNR status, pt as well??? *** eliquis, bleeding,    Device information MDT CRT-D, gen change 11/07/2014, gen change 11/12/21 now with a BSci generator System EXTRACTED   10/19/22 Dr. Odessa Fleming note: DNR >> HV tx OFF    Past Medical History:  Diagnosis Date   AICD (automatic cardioverter/defibrillator) present    Arthritis    knees, back    BPH (benign prostatic hypertrophy)    CHF (congestive heart failure) (HCC)    Chronic kidney disease    BPH   Chronic systolic  heart failure (HCC)    a. s/p MDT single chamber ICD 2004 as part of MASTER study b. upgrade to CRTD 2010; CRTD gen change 2016   Coronary artery disease    a. s/p anterior MI and CYPHER stent to LAD 2005   Dyslipidemia    Dysrhythmia    a-fib   Gout    Ischemic cardiomyopathy    Paroxysmal atrial fibrillation (HCC)    Ventricular tachycardia (HCC)     Past Surgical History:  Procedure Laterality Date   APPENDECTOMY  1970   BI-VENTRICULAR IMPLANTABLE CARDIOVERTER DEFIBRILLATOR UPGRADE N/A 11/07/2014   a. MDT single chamber ICD implanted 2004 as part of MASTER study; upgrade to CRTD 2010; gen change 2016   BIV ICD GENERATOR CHANGEOUT N/A 11/12/2021   Procedure: BIV ICD GENERATOR CHANGEOUT;  Surgeon: Duke Salvia, MD;  Location: [facility] INVASIVE CV LAB;  Service: Cardiovascular;  Laterality: N/A;   CARDIAC CATHETERIZATION  09/08/2003   x3 total of 5 stents   CARDIOVERSION N/A 07/04/2019   Procedure: CARDIOVERSION;  Surgeon: Wendall Stade, MD;  Location: [facility] ENDOSCOPY;  Service: Cardiovascular;  Laterality: N/A;   CARDIOVERSION N/A 08/17/2019   Procedure: CARDIOVERSION;  Surgeon: Vesta Mixer, MD;  Location: [facility] ENDOSCOPY;  Service: Cardiovascular;  Laterality: N/A;   CARDIOVERSION N/A 05/04/2022   Procedure: CARDIOVERSION;  Surgeon: Quintella Reichert, MD;  Location: [facility] ENDOSCOPY;  Service: Cardiovascular;  Laterality: N/A;  PROSTATECTOMY  11/06/2011   Procedure: PROSTATECTOMY SUPRAPUBIC;  Surgeon: Kathi Ludwig, MD;  Location: [facility] ORS;  Service: Urology;  Laterality: N/A;  Open Suprapubic Prostatectomy   TEE WITHOUT CARDIOVERSION N/A 05/16/2019   Procedure: TRANSESOPHAGEAL ECHOCARDIOGRAM (TEE);  Surgeon: Sande Rives, MD;  Location: [facility] ENDOSCOPY;  Service: Cardiology;  Laterality: N/A;   TOTAL KNEE ARTHROPLASTY Left 10/06/2021   Procedure: TOTAL KNEE ARTHROPLASTY;  Surgeon: Jodi Geralds, MD;  Location: [facility] ORS;  Service: Orthopedics;  Laterality: Left;    Current  Outpatient Medications  Medication Sig Dispense Refill   acetaminophen (TYLENOL) 325 MG tablet Take 2 tablets (650 mg total) by mouth every 4 (four) hours as needed for headache or mild pain.     allopurinol (ZYLOPRIM) 100 MG tablet Take 100 mg by mouth at bedtime.     carvedilol (COREG) 3.125 MG tablet TAKE 1 TABLET TWICE DAILY 180 tablet 2   furosemide (LASIX) 40 MG tablet Take 1 tablet (40 mg total) by mouth daily. 90 tablet 3   latanoprost (XALATAN) 0.005 % ophthalmic solution Place 1 drop into both eyes at bedtime.      levothyroxine (SYNTHROID) 125 MCG tablet Take 125 mcg by mouth daily before breakfast.     nitroGLYCERIN (NITROSTAT) 0.4 MG SL tablet Place 1 tablet (0.4 mg total) under the tongue every 5 (five) minutes as needed for chest pain. 30 tablet 3   Polyethyl Glycol-Propyl Glycol 0.4-0.3 % SOLN Place 1 drop into both eyes daily.     potassium chloride (KLOR-CON) 10 MEQ tablet Take 1 tablet (10 mEq total) by mouth daily. (Patient taking differently: Take 10 mEq by mouth 2 (two) times daily.) 90 tablet 3   rosuvastatin (CRESTOR) 10 MG tablet TAKE 1/2 TABLET EVERY DAY 45 tablet 2   No current facility-administered medications for this visit.    Allergies:   Lipitor [atorvastatin] and Jardiance [empagliflozin]   Social History:  The patient  reports that he has never smoked. He has never been exposed to tobacco smoke. He has never used smokeless tobacco. He reports that he does not drink alcohol and does not use drugs.   Family History:  The patient's family history includes Heart disease in his brother, brother, sister, and sister.  ROS:  Please see the history of present illness.  All other systems are reviewed and otherwise negative.   PHYSICAL EXAM:  VS:  There were no vitals taken for this visit. BMI: There is no height or weight on file to calculate BMI. Well nourished, well developed, in no acute distress  HEENT: normocephalic, atraumatic  Neck: no JVD, carotid bruits  or masses Cardiac: *** irreg-irreg; no significant murmurs, no rubs, or gallops Lungs: *** CTA b/l no wheezing, rhonchi or rales  Abd: soft, nontender MS: no deformity or atrophy Ext: *** has chronic looking skin changes b/l LE, no open wound appreciated LLE *** He has a bandage posterior RLE (not removed, slight erythema/edema in comparison to the left Skin: warm and dry, no rash Neuro:  No gross deficits appreciated Psych: euthymic mood, full affect  ICD extraction site: ***   EKG:  done today and reviewed by myself ***    11/23/2019: TTE IMPRESSIONS  1. Left ventricular ejection fraction, by estimation, is 30 to 35%. The  left ventricle has moderately decreased function. The left ventricle  demonstrates regional wall motion abnormalities (see scoring  diagram/findings for description). The left  ventricular internal cavity size was mildly dilated. Left ventricular  diastolic parameters are consistent  with Grade II diastolic dysfunction  (pseudonormalization). Elevated left ventricular end-diastolic pressure.   2. Right ventricular systolic function is normal. The right ventricular  size is normal.   3. The mitral valve is normal in structure. Mild mitral valve  regurgitation. No evidence of mitral stenosis.   4. The aortic valve is tricuspid. Aortic valve regurgitation is mild. No  aortic stenosis is present.   5. Aortic dilatation noted. There is mild dilatation of the ascending  aorta.   6. The inferior vena cava is normal in size with greater than 50%  respiratory variability, suggesting right atrial pressure of 3 mmHg.    12/20/2019: Stress myoview The left ventricular ejection fraction is severely decreased (<30%). Nuclear stress EF: 23%. There was no ST segment deviation noted during stress. There is a large defect of severe severity present in the basal anteroseptal, basal inferoseptal, mid anterior, mid anteroseptal, mid inferoseptal, apical anterior, apical  septal and apex location. This consistent with prior infarct/scar. No ischemia. Findings consistent with prior myocardial infarction. This is a high risk study.   Recent Labs: 12/17/2022: NT-Pro BNP 1,790; TSH 1.120 05/26/2023: Hemoglobin 13.9; Platelets 246 05/27/2023: BUN 17; Creatinine, Ser 1.12; Potassium 4.0; Sodium 138  No results found for requested labs within last 365 days.   CrCl cannot be calculated (Unknown ideal weight.).   Wt Readings from Last 3 Encounters:  05/26/23 224 lb 13.9 oz (102 kg)  05/20/23 225 lb 12.8 oz (102.4 kg)  12/17/22 221 lb 6.4 oz (100.4 kg)     Other studies reviewed: Additional studies/records reviewed today include: summarized above  ASSESSMENT AND PLAN:  1. CRT-D     S/p device system extraction at [facility]  *** pocket  2.  ICM 3. Chronic CHF     *** Exam looks OK currently     ***  He has been very sensitive/intolerant to meds all along BP does not allow much room Sounds like he gets orthostatic quickly with meds as well c/w Dr. Ladean Raya  4. CAD     No symptoms of angina      C/w Dr. Elease Hashimoto   5. Persistent >> permanent Afib     CHA2DS2Vasc is 4, on Eliquis, appropriately dosed     *** rate  6. Secondary hypercoagulable state 2/2 AFib         Disposition: ***     Current medicines are reviewed at length with the patient today.  The patient did not have any concerns regarding medicines.  James Fredrickson, PA-C 06/12/2023 8:55 AM     [facility] [address] [place] [address] [phone] (office)  [phone] (fax)

## 2023-06-14 ENCOUNTER — Telehealth: Payer: Self-pay

## 2023-06-14 ENCOUNTER — Ambulatory Visit: Payer: Medicare PPO | Attending: Physician Assistant | Admitting: Physician Assistant

## 2023-06-14 ENCOUNTER — Encounter: Payer: Self-pay | Admitting: Cardiovascular Disease

## 2023-06-14 ENCOUNTER — Encounter: Payer: Self-pay | Admitting: Physician Assistant

## 2023-06-14 VITALS — BP 110/64 | HR 66 | Ht 70.0 in | Wt 222.0 lb

## 2023-06-14 DIAGNOSIS — I255 Ischemic cardiomyopathy: Secondary | ICD-10-CM

## 2023-06-14 DIAGNOSIS — Z79899 Other long term (current) drug therapy: Secondary | ICD-10-CM

## 2023-06-14 DIAGNOSIS — I5022 Chronic systolic (congestive) heart failure: Secondary | ICD-10-CM | POA: Diagnosis not present

## 2023-06-14 DIAGNOSIS — I4821 Permanent atrial fibrillation: Secondary | ICD-10-CM | POA: Diagnosis not present

## 2023-06-14 DIAGNOSIS — D6869 Other thrombophilia: Secondary | ICD-10-CM

## 2023-06-14 LAB — CBC
Hematocrit: 38.9 % (ref 37.5–51.0)
Hemoglobin: 12.5 g/dL — ABNORMAL LOW (ref 13.0–17.7)
MCH: 31.6 pg (ref 26.6–33.0)
MCHC: 32.1 g/dL (ref 31.5–35.7)
MCV: 98 fL — ABNORMAL HIGH (ref 79–97)
Platelets: 224 10*3/uL (ref 150–450)
RBC: 3.96 x10E6/uL — ABNORMAL LOW (ref 4.14–5.80)
RDW: 12.9 % (ref 11.6–15.4)
WBC: 9.9 10*3/uL (ref 3.4–10.8)
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
## 2023-06-14 NOTE — Telephone Encounter (Signed)
Spoke with patient regarding ICM monthly follow up.  Pt had device removed at [facility] and does not plan to replace it at this time.  Advised will no longer receive monthly ICM calls since he does not have a device.  He would like for me to cancel his appt with Dr Elease Hashimoto and will call to reschedule. Advised will send to Dr Harvie Bridge nurse to let her know his is canceling the appt.

## 2023-06-14 NOTE — Patient Instructions (Addendum)
Medication Instructions:   FOR 3 DAYS ONLY : TAKE LASIX 40 MG TWICE  A DAY  THEN RESUME TAKING ONCE A DAY   *If you need a refill on your cardiac medications before your next appointment, please call your pharmacy*   Lab Work: CBC TODAY   If you have labs (blood work) drawn today and your tests are completely normal, you will receive your results only by: MyChart Message (if you have MyChart) OR A paper copy in the mail If you have any lab test that is abnormal or we need to change your treatment, we will call you to review the results.   Testing/Procedures: NONE ORDERED  TODAY     Follow-Up: At [facility], you and your health needs are our priority.  As part of our continuing mission to provide you with exceptional heart care, we have created designated Provider Care Teams.  These Care Teams include your primary Cardiologist (physician) and Advanced Practice Providers (APPs -  Physician Assistants and Nurse Practitioners) who all work together to provide you with the care you need, when you need it.  We recommend signing up for the patient portal called "MyChart".  Sign up information is provided on this After Visit Summary.  MyChart is used to connect with patients for Virtual Visits (Telemedicine).  Patients are able to view lab/test results, encounter notes, upcoming appointments, etc.  Non-urgent messages can be sent to your provider as well.   To learn more about what you can do with MyChart, go to [email or web address].    Your next appointment:  CONTACT [facility] [phone] AS NEEDED FOR  ANY CARDIAC RELATED SYMPTOMS   Provider:   You may see Sherryl Manges, MD or one of the following Advanced Practice Providers on your designated Care Team:   Francis Dowse, [place] Casimiro Needle "Mardelle Matte" Lanna Poche, [place]   Other Instructions

## 2023-06-14 NOTE — Telephone Encounter (Signed)
Rescheduled currently scheduled appt w/nahser for 10/20/23.

## 2023-06-15 ENCOUNTER — Ambulatory Visit: Payer: Medicare PPO | Admitting: Cardiovascular Disease

## 2023-06-23 ENCOUNTER — Encounter (HOSPITAL_COMMUNITY): Payer: Self-pay | Admitting: Gastroenterology

## 2023-06-23 ENCOUNTER — Encounter (HOSPITAL_COMMUNITY)
Admission: RE | Disposition: A | Discharge status: Home / Self Care | Payer: Self-pay | Source: Home / Self Care | Attending: Gastroenterology

## 2023-06-23 ENCOUNTER — Ambulatory Visit (HOSPITAL_COMMUNITY)
Admission: RE | Admit: 2023-06-23 | Discharge: 2023-06-23 | Disposition: A | Discharge status: Home / Self Care | Payer: Medicare PPO | Source: Home / Self Care | Attending: Gastroenterology | Admitting: Gastroenterology

## 2023-06-23 DIAGNOSIS — R159 Full incontinence of feces: Secondary | ICD-10-CM

## 2023-06-23 DIAGNOSIS — K5902 Outlet dysfunction constipation: Secondary | ICD-10-CM | POA: Diagnosis not present

## 2023-06-23 DIAGNOSIS — K810 Acute cholecystitis: Secondary | ICD-10-CM | POA: Diagnosis not present

## 2023-06-23 DIAGNOSIS — K8000 Calculus of gallbladder with acute cholecystitis without obstruction: Secondary | ICD-10-CM | POA: Diagnosis not present

## 2023-06-23 HISTORY — PX: ANAL RECTAL MANOMETRY: SHX6358

## 2023-06-23 SURGERY — MANOMETRY, ANORECTAL

## 2023-06-23 NOTE — Progress Notes (Signed)
Anal rectal manometry performed per protocol without complications.  Patient tolerated well.  Balloon expulsion test performed per protocol without complications.  Patient tolerated well. Patient unable to exxpel balloon after three minutes.

## 2023-06-26 ENCOUNTER — Other Ambulatory Visit: Payer: Self-pay

## 2023-06-26 ENCOUNTER — Inpatient Hospital Stay (HOSPITAL_COMMUNITY)
Admission: EM | Admit: 2023-06-26 | Discharge: 2023-07-01 | DRG: 445 | Disposition: A | Discharge status: Home Health | Payer: Medicare PPO | Attending: Infectious Diseases | Admitting: Infectious Diseases

## 2023-06-26 ENCOUNTER — Emergency Department (HOSPITAL_COMMUNITY): Payer: Medicare PPO

## 2023-06-26 ENCOUNTER — Encounter (HOSPITAL_COMMUNITY): Payer: Self-pay | Admitting: Internal Medicine

## 2023-06-26 ENCOUNTER — Encounter: Payer: Self-pay | Admitting: Cardiovascular Disease

## 2023-06-26 DIAGNOSIS — N400 Enlarged prostate without lower urinary tract symptoms: Secondary | ICD-10-CM | POA: Diagnosis present

## 2023-06-26 DIAGNOSIS — E785 Hyperlipidemia, unspecified: Secondary | ICD-10-CM | POA: Diagnosis present

## 2023-06-26 DIAGNOSIS — E669 Obesity, unspecified: Secondary | ICD-10-CM | POA: Diagnosis present

## 2023-06-26 DIAGNOSIS — I252 Old myocardial infarction: Secondary | ICD-10-CM | POA: Diagnosis not present

## 2023-06-26 DIAGNOSIS — K819 Cholecystitis, unspecified: Principal | ICD-10-CM

## 2023-06-26 DIAGNOSIS — Z955 Presence of coronary angioplasty implant and graft: Secondary | ICD-10-CM

## 2023-06-26 DIAGNOSIS — I951 Orthostatic hypotension: Secondary | ICD-10-CM | POA: Diagnosis present

## 2023-06-26 DIAGNOSIS — K810 Acute cholecystitis: Secondary | ICD-10-CM | POA: Diagnosis present

## 2023-06-26 DIAGNOSIS — I447 Left bundle-branch block, unspecified: Secondary | ICD-10-CM | POA: Diagnosis present

## 2023-06-26 DIAGNOSIS — E039 Hypothyroidism, unspecified: Secondary | ICD-10-CM | POA: Diagnosis present

## 2023-06-26 DIAGNOSIS — I2510 Atherosclerotic heart disease of native coronary artery without angina pectoris: Secondary | ICD-10-CM | POA: Diagnosis present

## 2023-06-26 DIAGNOSIS — Z9079 Acquired absence of other genital organ(s): Secondary | ICD-10-CM

## 2023-06-26 DIAGNOSIS — I5022 Chronic systolic (congestive) heart failure: Secondary | ICD-10-CM | POA: Diagnosis present

## 2023-06-26 DIAGNOSIS — Z6832 Body mass index (BMI) 32.0-32.9, adult: Secondary | ICD-10-CM

## 2023-06-26 DIAGNOSIS — I878 Other specified disorders of veins: Secondary | ICD-10-CM | POA: Diagnosis present

## 2023-06-26 DIAGNOSIS — I255 Ischemic cardiomyopathy: Secondary | ICD-10-CM | POA: Diagnosis present

## 2023-06-26 DIAGNOSIS — Z79890 Hormone replacement therapy (postmenopausal): Secondary | ICD-10-CM | POA: Diagnosis not present

## 2023-06-26 DIAGNOSIS — E1122 Type 2 diabetes mellitus with diabetic chronic kidney disease: Secondary | ICD-10-CM | POA: Diagnosis present

## 2023-06-26 DIAGNOSIS — E66811 Obesity, class 1: Secondary | ICD-10-CM | POA: Diagnosis present

## 2023-06-26 DIAGNOSIS — I130 Hypertensive heart and chronic kidney disease with heart failure and stage 1 through stage 4 chronic kidney disease, or unspecified chronic kidney disease: Secondary | ICD-10-CM | POA: Diagnosis present

## 2023-06-26 DIAGNOSIS — Z66 Do not resuscitate: Secondary | ICD-10-CM | POA: Diagnosis present

## 2023-06-26 DIAGNOSIS — K760 Fatty (change of) liver, not elsewhere classified: Secondary | ICD-10-CM | POA: Diagnosis present

## 2023-06-26 DIAGNOSIS — K8000 Calculus of gallbladder with acute cholecystitis without obstruction: Secondary | ICD-10-CM | POA: Diagnosis present

## 2023-06-26 DIAGNOSIS — N189 Chronic kidney disease, unspecified: Secondary | ICD-10-CM | POA: Diagnosis present

## 2023-06-26 DIAGNOSIS — Z01810 Encounter for preprocedural cardiovascular examination: Secondary | ICD-10-CM | POA: Diagnosis not present

## 2023-06-26 DIAGNOSIS — I872 Venous insufficiency (chronic) (peripheral): Secondary | ICD-10-CM | POA: Diagnosis present

## 2023-06-26 DIAGNOSIS — I4891 Unspecified atrial fibrillation: Secondary | ICD-10-CM | POA: Diagnosis not present

## 2023-06-26 DIAGNOSIS — I4819 Other persistent atrial fibrillation: Secondary | ICD-10-CM | POA: Diagnosis present

## 2023-06-26 DIAGNOSIS — E1151 Type 2 diabetes mellitus with diabetic peripheral angiopathy without gangrene: Secondary | ICD-10-CM | POA: Diagnosis present

## 2023-06-26 DIAGNOSIS — K921 Melena: Secondary | ICD-10-CM | POA: Diagnosis not present

## 2023-06-26 DIAGNOSIS — I25810 Atherosclerosis of coronary artery bypass graft(s) without angina pectoris: Secondary | ICD-10-CM | POA: Diagnosis not present

## 2023-06-26 DIAGNOSIS — I5042 Chronic combined systolic (congestive) and diastolic (congestive) heart failure: Secondary | ICD-10-CM | POA: Diagnosis not present

## 2023-06-26 DIAGNOSIS — I25118 Atherosclerotic heart disease of native coronary artery with other forms of angina pectoris: Secondary | ICD-10-CM | POA: Diagnosis not present

## 2023-06-26 DIAGNOSIS — Z79899 Other long term (current) drug therapy: Secondary | ICD-10-CM | POA: Diagnosis not present

## 2023-06-26 DIAGNOSIS — M793 Panniculitis, unspecified: Secondary | ICD-10-CM | POA: Diagnosis present

## 2023-06-26 DIAGNOSIS — Z7901 Long term (current) use of anticoagulants: Secondary | ICD-10-CM

## 2023-06-26 DIAGNOSIS — I472 Ventricular tachycardia, unspecified: Secondary | ICD-10-CM | POA: Diagnosis present

## 2023-06-26 DIAGNOSIS — Z8249 Family history of ischemic heart disease and other diseases of the circulatory system: Secondary | ICD-10-CM

## 2023-06-26 DIAGNOSIS — Z96652 Presence of left artificial knee joint: Secondary | ICD-10-CM | POA: Diagnosis present

## 2023-06-26 DIAGNOSIS — M1A9XX0 Chronic gout, unspecified, without tophus (tophi): Secondary | ICD-10-CM | POA: Diagnosis present

## 2023-06-26 DIAGNOSIS — K828 Other specified diseases of gallbladder: Secondary | ICD-10-CM | POA: Diagnosis present

## 2023-06-26 DIAGNOSIS — Z888 Allergy status to other drugs, medicaments and biological substances status: Secondary | ICD-10-CM

## 2023-06-26 LAB — BRAIN NATRIURETIC PEPTIDE: B Natriuretic Peptide: 455.8 pg/mL — ABNORMAL HIGH (ref 0.0–100.0)

## 2023-06-26 LAB — BASIC METABOLIC PANEL
Anion gap: 11 (ref 5–15)
BUN: 17 mg/dL (ref 8–23)
CO2: 25 mmol/L (ref 22–32)
Calcium: 9.3 mg/dL (ref 8.9–10.3)
Chloride: 102 mmol/L (ref 98–111)
Creatinine, Ser: 1.13 mg/dL (ref 0.61–1.24)
GFR, Estimated: >60 mL/min (ref >60)
Glucose, Bld: 118 mg/dL — ABNORMAL HIGH (ref 70–99)
Potassium: 4.2 mmol/L (ref 3.5–5.1)
Sodium: 138 mmol/L (ref 135–145)

## 2023-06-26 LAB — LIPASE, BLOOD: Lipase: 29 U/L (ref 11–51)

## 2023-06-26 LAB — TROPONIN I (HIGH SENSITIVITY)
Troponin I (High Sensitivity): 26 ng/L — ABNORMAL HIGH (ref <18)
Troponin I (High Sensitivity): 22 ng/L — ABNORMAL HIGH (ref <18)

## 2023-06-26 LAB — CBC
HCT: 36.3 % — ABNORMAL LOW (ref 39.0–52.0)
Hemoglobin: 11.7 g/dL — ABNORMAL LOW (ref 13.0–17.0)
MCH: 30.7 pg (ref 26.0–34.0)
MCHC: 32.2 g/dL (ref 30.0–36.0)
MCV: 95.3 fL (ref 80.0–100.0)
Platelets: 278 10*3/uL (ref 150–400)
RBC: 3.81 MIL/uL — ABNORMAL LOW (ref 4.22–5.81)
RDW: 13.5 % (ref 11.5–15.5)
WBC: 10.2 10*3/uL (ref 4.0–10.5)
nRBC: 0 % (ref 0.0–0.2)

## 2023-06-26 LAB — HEPATIC FUNCTION PANEL
ALT: 18 U/L (ref 0–44)
AST: 23 U/L (ref 15–41)
Albumin: 3 g/dL — ABNORMAL LOW (ref 3.5–5.0)
Alkaline Phosphatase: 76 U/L (ref 38–126)
Bilirubin, Direct: 0.3 mg/dL — ABNORMAL HIGH (ref 0.0–0.2)
Indirect Bilirubin: 0.6 mg/dL (ref 0.3–0.9)
Total Bilirubin: 0.9 mg/dL (ref 0.3–1.2)
Total Protein: 6 g/dL — ABNORMAL LOW (ref 6.5–8.1)

## 2023-06-26 MED ORDER — CARMEX CLASSIC LIP BALM EX OINT
TOPICAL_OINTMENT | CUTANEOUS | Status: DC | PRN
  Filled 2023-06-26: qty 10

## 2023-06-26 MED ORDER — ONDANSETRON HCL 4 MG/2ML IJ SOLN
4.0000 mg | Freq: Once | INTRAMUSCULAR | Status: DC
  Filled 2023-06-26: qty 2

## 2023-06-26 MED ORDER — LATANOPROST 0.005 % OP SOLN
1.0000 [drp] | Freq: Every day | OPHTHALMIC | Status: DC
  Administered 2023-06-26 – 2023-06-30 (×5): 1 [drp] via OPHTHALMIC
  Filled 2023-06-26: qty 2.5

## 2023-06-26 MED ORDER — HEPARIN (PORCINE) 25000 UT/250ML-% IV SOLN
1800.0000 [IU]/h | INTRAVENOUS | Status: DC
  Administered 2023-06-26: 1300 [IU]/h via INTRAVENOUS
  Filled 2023-06-26 (×2): qty 250

## 2023-06-26 MED ORDER — ROSUVASTATIN CALCIUM 5 MG PO TABS
5.0000 mg | ORAL_TABLET | Freq: Every day | ORAL | Status: DC
  Administered 2023-06-26 – 2023-07-01 (×6): 5 mg via ORAL
  Filled 2023-06-26 (×7): qty 1

## 2023-06-26 MED ORDER — IOHEXOL 350 MG/ML SOLN
75.0000 mL | Freq: Once | INTRAVENOUS | Status: AC | PRN
  Administered 2023-06-26: 75 mL via INTRAVENOUS

## 2023-06-26 MED ORDER — CARVEDILOL 3.125 MG PO TABS
3.1250 mg | ORAL_TABLET | Freq: Two times a day (BID) | ORAL | Status: DC
  Administered 2023-06-26 – 2023-06-30 (×9): 3.125 mg via ORAL
  Filled 2023-06-26 (×10): qty 1

## 2023-06-26 MED ORDER — PIPERACILLIN-TAZOBACTAM 3.375 G IVPB
3.3750 g | Freq: Three times a day (TID) | INTRAVENOUS | Status: DC
  Administered 2023-06-26 – 2023-07-01 (×14): 3.375 g via INTRAVENOUS
  Filled 2023-06-26 (×14): qty 50

## 2023-06-26 MED ORDER — ALLOPURINOL 100 MG PO TABS
100.0000 mg | ORAL_TABLET | Freq: Every day | ORAL | Status: DC
  Administered 2023-06-26 – 2023-06-30 (×5): 100 mg via ORAL
  Filled 2023-06-26 (×5): qty 1

## 2023-06-26 MED ORDER — PIPERACILLIN-TAZOBACTAM 3.375 G IVPB 30 MIN
3.3750 g | Freq: Once | INTRAVENOUS | Status: AC
  Administered 2023-06-26: 3.375 g via INTRAVENOUS
  Filled 2023-06-26: qty 50

## 2023-06-26 NOTE — Hospital Course (Signed)
Patient is a 82 yo with a pertinent history of CHF, Afib on anticoagulation s/p ICD removal September 2024, CKD, V. Tach, CAD, and HTN who presents with abdominal pain.   CT Abd Pelvis W Contrast: IMPRESSION: 1. Cholelithiasis with mild diffuse gallbladder wall edema and mild pericholecystic fat stranding. There is suspected short segment of cystic duct which exhibits hyperattenuating contents, concerning for sludge within the cystic duct. In appropriate clinical settings, these findings suggest early/subacute cholecystitis.  RUQ [place]: IMPRESSION: 1. Gallbladder wall thickening with sludge versus small stone in the gallbladder neck, findings are concerning for acute cholecystitis, although sonographic Murphy sign was negative. Recommend clinical correlation. 2. Hepatic steatosis.    Acute cholecystitis Symptoms and clinical findings concerning for acute cholecystitis. Patient started on IV Zosyn and heparin gtt. VS stable. HIDA scan ordered, pending. Surgery likely pending HIDA scan. Will defer to surgery for recommendations. Patient given heart healthy diet and will be NPO at midnight.  Afib, rate controlled S/p ICD system extraction 06/01/2023. On Eliquis 5 mg BID and Carvedilol/Coreg 3.125 mg BID. EKG on admission with Afib, HR 81 BPM, Qtc 485 ms, LBBB (stable from 06/14/23 EKG). Asymptomatic on exam. Last Eliquis dose yesterday, will hold and start IV heparin for possible surgical procedure.    History of CHF (LVEF 30-35%) CAD MI s/p stent to LAD 2005 HTN History of congestive heart failure, CAD, MI, and HTN. Home medications include carvedilol 3.125 mg BID, furosemide/lasix 40 mg daily, potassium chloride 10 mEq BID. Follows regularly with cardiology and EP. Placed on telemetry. Resumed home carvedilol 3.125 mg BID. Monitored Mg and K and repleted as necessary.  Stating patient on Kcl 10 meq daily Starting patient on home Lasix 40 mg po   HLD  Hepatic steatosis Chronic. Last lipid  panel 05/26/2022: cholesterol 133, triglycerides 164, HDL 33, VLDL 28, will repeat. Previously on atorvastatin but stopped due to joint pain. RUQ showing hepatic steatosis, follow up LFTs on CMP. Resumed home Rosuvastatin 5 mg daily.   CKD Chronic. Serum Cr 1.13 on admission.    Chronic Conditions: Venous insufficiency Chronic. 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 Patient reports use of compression stockings at home. Recommended keeping skin moisturized and legs elevated.  Hypothyroidism Chronic. TSH 1.120 12/17/2022, will check TSH here. Resumed levothyroxine 125 mcg on admission. Gout Chronic. No acute gout flare on admission, has been years since the last one. Resumed home allopurinol 100 mg at bedtime on admission.

## 2023-06-26 NOTE — ED Notes (Signed)
US at bedside

## 2023-06-26 NOTE — H&P (Cosign Needed Addendum)
Date: 06/26/2023               Patient Name:  [name] MRN: [id number]  DOB: Sep 06, 1941 Age / Sex: 82 y.o., male   PCP: Kaleen Mask, MD         Medical Service: Internal Medicine Teaching Service         Attending Physician: Dr. Inez Catalina, MD    First Contact: Dr. Philomena Doheny, MD Pager: [phone]  Second Contact: Dr. Gwenevere Abbot, MD  Pager: [phone]       After Hours (After 5p/  First Contact Pager: [phone]  weekends / holidays): Second Contact Pager: [phone]   Chief Complaint: abdominal pain   History of Present Illness:    Patient is a 83 yo with a pertinent history of CHF, Afib on anticoagulation s/p ICD removal September 2024, CKD, V. Tach, CAD, and HTN who presents with abdominal pain. Patient's daughter and son in law are at bedside.   Patient states he underwent an anal manometry study on Wednesday 10/16 [facility]) without complication. Study was to investigate cause of loose stools/fecal incontinence. Following the procedure he states he began experiencing severe pain on the right side of his abdomen followed by emesis after eating dinner. Pain was worse laying flat, initially sharp and consistent. Took pepto bismol and tums with limited relief. Continued having pain, which became more central and intermittent, worsened with meals. Experienced chills. Oral intake was limited by pain and nausea. Denies hematemesis, diarrhea, CP, SOB, heart palpitations, fever, or dysuria. Last [facility] Thursday 10/17. Denies history of gallstones or alcohol abuse. No recent abdominal surgeries. States he has never experienced this kind of pain before.   Of note patient endorses history of infections, including pocket infection of ICD and wounds on lower legs that take a long time to heal. Last Hgb A1C 6.5%, per patient he is prediabetic and not on any medications. On chart review patient was taking metformin for some time.    Sees Dr. Elease Hashimoto (cardiology)  regularly, last seen June 26, 2023. Also sees Dr. Clide Cliff (cardiology/EP) regularly. PCP is Dr. Jeannetta Nap.    Code Status: confirmed DNR with patient and family present.  Surrogate decision maker: Daughter, [name]  ED Course:  Patient arrived to ED stable, BP 133/79, aHR 87, afebrile. Imaging obtained showing likely acute cholecystitis. Started on IV zosyn and general surgery was consulted.   Meds:  Tylenol, allopurinol, Eliquis, carvedilol, furosemide, latanoprost eye drops, levothyroxine, nitroglycerin PRN, potassium chloride, rosuvastatin. Current Meds  Medication Sig   linezolid (ZYVOX) 600 MG tablet Take 600 mg by mouth 2 (two) times daily.   Allergies: Allergies as of 06/26/2023 - Reviewed 06/26/2023  Allergen Reaction Noted   Lipitor [atorvastatin] Other (See Comments) 12/11/2013   Jardiance [empagliflozin] Other (See Comments) 05/25/2022   Past Medical History:  Diagnosis Date   AICD (automatic cardioverter/defibrillator) present    Arthritis    knees, back    BPH (benign prostatic hypertrophy)    CHF (congestive heart failure) (HCC)    Chronic kidney disease    BPH   Chronic systolic heart failure (HCC)    a. s/p MDT single chamber ICD 2004 as part of MASTER study b. upgrade to CRTD 2010; CRTD gen change 2016   Coronary artery disease    a. s/p anterior MI and CYPHER stent to LAD 2005   Dyslipidemia    Dysrhythmia    a-fib   Gout    Ischemic cardiomyopathy  Paroxysmal atrial fibrillation (HCC)    Ventricular tachycardia (HCC)    Family History: Per chart review. Sister- heart disease  Brother - heart disease   Social History:  - Lives at home alone, independent with most ADLs, gets help from daughter and son in law for IADLs (they live next door) - Retired - No current or former use of tobacco  - Very minimal alcohol use, occasionally a beer in the summer - Denies illicit drug use   Review of Systems: A complete ROS was negative except as per HPI.    Physical Exam: Blood pressure 129/84, pulse 70, temperature 98 F (36.7 C), temperature source Oral, resp. rate 16, height 5\' 10"  (1.778 m), weight 100.7 kg, SpO2 98%. General: Patient resting comfortably in bed in no acute distress.  CV: Heart rhythm regularly irregular, no r/m/g. Pulmonary: Some crackles posterior lower lobes, normal respiratory effort on room air. Abdominal: Soft, slightly distended, epigastric tenderness.  MSK: Bilateral lower extremity edema, erythematous shins with skin atrophy, bilateral pedal pulses intact, no signs of acute gout flare.  Neuro: Alert and oriented, asking and answering questions appropriately, no acute deficits noted.  Psych: Normal mood and affect, slightly anxious discussing previous anesthesia experience.    EKG: personally reviewed my interpretation is Afib, HR 81, Qtc 485 ms, LBBB (stable from 06/14/23 EKG).  CXR: personally reviewed: Cardiac enlargement.  Mild fissure thickening but no Kerley lines or cephalized blood flow.  No effusion, pneumothorax, or consolidation. Coronary stenting. No acute disease.   CT abdomen pelvis with contrast (comparison CT 01/03/21): 1. Cholelithiasis with mild diffuse gallbladder wall edema and mild pericholecystic fat stranding. There is suspected short segment of cystic duct which exhibits hyperattenuating contents, concerning for sludge within the cystic duct. In appropriate clinical settings, these findings suggest early/subacute cholecystitis. 2. Multiple other nonacute observations, as described above.  RUQ [place]: IMPRESSION: 1. Gallbladder wall thickening with sludge versus small stone in the gallbladder neck, findings are concerning for acute cholecystitis, although sonographic Murphy sign was negative. Recommend clinical correlation. 2. Hepatic steatosis.  Assessment & Plan by Problem: Principal Problem:   Acute cholecystitis Patient with intermittent epigastric pain associated with meals  that radiates to the back. Endorses chills, nausea, and vomiting. Denies diarrhea. Limited improvement with tums and pepto bismol. Prior appendectomy, no recent abdominal surgeries. Very occasional alcohol use, no history of alcohol use disorder.   WBC 10.2 WNL, lipase 29. CT and RUQ US showing inflammation of the gallbladder wall as well as gallbladder thickening/edema/sludge and gallstones. On physical exam patient with epigastric tenderness and abdomen was noted to be slightly distended. General surgery was consulted in the ED.   Symptoms and clinical findings concerning for acute cholecystitis. Patient started on IV Zosyn, made NPO, on heparin gtt. VS stable. HIDA scan ordered, pending. Surgery likely pending HIDA scan. Will defer to surgery for recommendations.  Plan:  - f/u HIDA scan/surgery recommendations - NPO  - Heparin gtt - IV Zosyn  - trend temperature  - monitor daily labs   Afib, rate controlled S/p ICD system extraction 06/01/2023. On Eliquis 5 mg BID and Carvedilol/Coreg 3.125 mg BID. EKG on admission with Afib, HR 81 BPM, Qtc 485 ms, LBBB (stable from 06/14/23 EKG). Asymptomatic on exam. Last Eliquis dose yesterday, will hold and start IV heparin for possible surgical procedure. Plan:  - Resume home carvedilol 3.125 mg BID - Restart home Eliquis after surgical evaluation/surgery   History of CHF (LVEF 30-35%) CAD MI s/p stent to LAD 2005  HTN History of congestive heart failure, CAD, MI, and HTN. Home medications include carvedilol 3.125 mg BID, furosemide/lasix 40 mg daily, potassium chloride 10 mEq BID. Sleeps flat and uses only one pillow under his head.   Follows regularly with Dr. Elease Hashimoto, MD [facility]), last visit 06/15/23, 10-month follow up visit with Eligha Bridegroom, NP April 2025). BNP elevated at 455.8. Potassium 4.2 on admission. EKG Afib, stable LBBB. Troponins 26 > 22, peaked. CXR showing cardiac enlargement, negative for effusion or  acute disease. Bilateral leg swelling and abdominal swelling as well as lung crackles on exam. Normal respiratory effort on room air.  Plan:  - On Telemetry  - Resume home carvedilol 3.125 mg BID  - Hold lasix today in setting of NPO and possible surgery; assess volume status tomorrow and resume if appropriate - Strict I/Os - Daily weights  - Monitor daily BMP, replete K as needed to maintain > 4 - F/u magnesium, maintain > 2 and replete as needed  HLD  Hepatic steatosis Chronic. Last lipid panel 05/26/2022: cholesterol 133, triglycerides 164, HDL 33, VLDL 28, will repeat. Previously on atorvastatin but stopped due to joint pain. RUQ showing hepatic steatosis, will trend LFTs on CMP.  - Resume home Rosuvastatin 1/2 10 mg tablet daily  - Follow up CMP  CKD Chronic. Serum Cr 1.13 on admission. Will monitor.   Venous insufficiency Chronic. 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 Patient reports use of compression stockings at home. Recommend keeping skin moisturized and legs elevated.   Hypothyroidism Chronic. TSH 1.120 12/17/2022, will check TSH here.  - Resume home levothyroxine 125 mcg  Gout Chronic. No acute gout flare on admission, has been years since the last one.  - Resume home allopurinol 100 mg at bedtime   VTE prophylaxis: heparin gtt Diet: NPO Status: DNR  Prior to Admission Living Arrangement: Home Anticipated Discharge Location: pending Barriers to Discharge: medical management Dispo: Admit patient to Inpatient with expected length of stay greater than 2 midnights.  Signed: Philomena Doheny, MD 06/26/2023, 3:59 PM  After 5pm on weekdays and 1pm on weekends: On Call pager: [phone]

## 2023-06-26 NOTE — ED Notes (Signed)
Pt co epigastric pain for 3 days, reports had exploratory surgery on Wed this week and has pain since, reports pain worse at night. NAD noted at this time, skin warm and dry, respirations even and unlabored at this time.

## 2023-06-26 NOTE — ED Triage Notes (Signed)
Patient reports intermittent central chest /epigastric pain for 4 days with SOB and emesis , pain increases when lying on bed , no cough or fever .

## 2023-06-26 NOTE — ED Notes (Signed)
ED TO INPATIENT HANDOFF REPORT  ED Nurse Name and Phone #: Murlean Iba Paramedic [phone]  S Name/Age/Gender [name] 82 y.o. male Room/Bed: 007C/007C  Code Status   Code Status: Do not attempt resuscitation (DNR) PRE-ARREST INTERVENTIONS DESIRED  Home/SNF/Other Home Patient oriented to: self, place, time, and situation Is this baseline? Yes   Triage Complete: Triage complete  Chief Complaint Acute cholecystitis [K81.0]  Triage Note Patient reports intermittent central chest /epigastric pain for 4 days with SOB and emesis , pain increases when lying on bed , no cough or fever .    Allergies Allergies  Allergen Reactions   Lipitor [Atorvastatin] Other (See Comments)    MUSCLE ACHE    Jardiance [Empagliflozin] Other (See Comments)    Intolerance     Level of Care/Admitting Diagnosis ED Disposition     ED Disposition  Admit   Condition  --   Comment  Hospital Area: [facility] [[id number]]  Level of Care: Telemetry Medical [104]  May admit patient to [name] or [facility] if equivalent level of care is available:: No  Covid Evaluation: Asymptomatic - no recent exposure (last 10 days) testing not required  Diagnosis: Acute cholecystitis [575.0.ICD-9-CM]  Admitting Physician: Gwenevere Abbot [[id number]]  Attending Physician: Inez Catalina [phone]  Certification:: I certify this patient will need inpatient services for at least 2 midnights  Expected Medical Readiness: 06/29/2023          B Medical/Surgery History Past Medical History:  Diagnosis Date   AICD (automatic cardioverter/defibrillator) present    Arthritis    knees, back    BPH (benign prostatic hypertrophy)    CHF (congestive heart failure) (HCC)    Chronic kidney disease    BPH   Chronic systolic heart failure (HCC)    a. s/p MDT single chamber ICD 2004 as part of MASTER study b. upgrade to CRTD 2010; CRTD gen change 2016   Coronary artery disease    a. s/p anterior MI and  CYPHER stent to LAD 2005   Dyslipidemia    Dysrhythmia    a-fib   Gout    Ischemic cardiomyopathy    Paroxysmal atrial fibrillation (HCC)    Ventricular tachycardia (HCC)    Past Surgical History:  Procedure Laterality Date   APPENDECTOMY  1970   BI-VENTRICULAR IMPLANTABLE CARDIOVERTER DEFIBRILLATOR UPGRADE N/A 11/07/2014   a. MDT single chamber ICD implanted 2004 as part of MASTER study; upgrade to CRTD 2010; gen change 2016   BIV ICD GENERATOR CHANGEOUT N/A 11/12/2021   Procedure: BIV ICD GENERATOR CHANGEOUT;  Surgeon: Duke Salvia, MD;  Location: [facility] INVASIVE CV LAB;  Service: Cardiovascular;  Laterality: N/A;   CARDIAC CATHETERIZATION  09/08/2003   x3 total of 5 stents   CARDIOVERSION N/A 07/04/2019   Procedure: CARDIOVERSION;  Surgeon: Wendall Stade, MD;  Location: [facility] ENDOSCOPY;  Service: Cardiovascular;  Laterality: N/A;   CARDIOVERSION N/A 08/17/2019   Procedure: CARDIOVERSION;  Surgeon: Vesta Mixer, MD;  Location: [facility] ENDOSCOPY;  Service: Cardiovascular;  Laterality: N/A;   CARDIOVERSION N/A 05/04/2022   Procedure: CARDIOVERSION;  Surgeon: Quintella Reichert, MD;  Location: [facility] ENDOSCOPY;  Service: Cardiovascular;  Laterality: N/A;   PROSTATECTOMY  11/06/2011   Procedure: PROSTATECTOMY SUPRAPUBIC;  Surgeon: Kathi Ludwig, MD;  Location: [facility] ORS;  Service: Urology;  Laterality: N/A;  Open Suprapubic Prostatectomy   TEE WITHOUT CARDIOVERSION N/A 05/16/2019   Procedure: TRANSESOPHAGEAL ECHOCARDIOGRAM (TEE);  Surgeon: Sande Rives, MD;  Location: [facility]  ENDOSCOPY;  Service: Cardiology;  Laterality: N/A;   TOTAL KNEE ARTHROPLASTY Left 10/06/2021   Procedure: TOTAL KNEE ARTHROPLASTY;  Surgeon: Jodi Geralds, MD;  Location: [facility] ORS;  Service: Orthopedics;  Laterality: Left;     A IV Location/Drains/Wounds Patient Lines/Drains/Airways Status     Active Line/Drains/Airways     Name Placement date Placement time Site Days   Peripheral IV 06/26/23 20 G Right Antecubital  06/26/23  0659  Antecubital  less than 1            Intake/Output Last 24 hours  Intake/Output Summary (Last 24 hours) at 06/26/2023 1513 Last data filed at 06/26/2023 1421 Gross per 24 hour  Intake --  Output 1000 ml  Net -1000 ml    Labs/Imaging Results for orders placed or performed during the hospital encounter of 06/26/23 (from the past 48 hour(s))  Basic metabolic panel     Status: Abnormal   Collection Time: 06/26/23  6:26 AM  Result Value Ref Range   Sodium 138 135 - 145 mmol/L   Potassium 4.2 3.5 - 5.1 mmol/L   Chloride 102 98 - 111 mmol/L   CO2 25 22 - 32 mmol/L   Glucose, Bld 118 (H) 70 - 99 mg/dL    Comment: Glucose reference range applies only to samples taken after fasting for at least 8 hours.   BUN 17 8 - 23 mg/dL   Creatinine, Ser 1.19 0.61 - 1.24 mg/dL   Calcium 9.3 8.9 - 14.7 mg/dL   GFR, Estimated >82 >95 mL/min    Comment: (NOTE) Calculated using the CKD-EPI Creatinine Equation (2021)    Anion gap 11 5 - 15    Comment: Performed at [facility], [address] [place], [place] [address]  CBC     Status: Abnormal   Collection Time: 06/26/23  6:26 AM  Result Value Ref Range   WBC 10.2 4.0 - 10.5 K/uL   RBC 3.81 (L) 4.22 - 5.81 MIL/uL   Hemoglobin 11.7 (L) 13.0 - 17.0 g/dL   HCT 86.5 (L) 78.4 - 69.6 %   MCV 95.3 80.0 - 100.0 fL   MCH 30.7 26.0 - 34.0 pg   MCHC 32.2 30.0 - 36.0 g/dL   RDW 29.5 28.4 - 13.2 %   Platelets 278 150 - 400 K/uL   nRBC 0.0 0.0 - 0.2 %    Comment: Performed at [facility], [address] [place], [place] [address]  Troponin I (High Sensitivity)     Status: Abnormal   Collection Time: 06/26/23  6:26 AM  Result Value Ref Range   Troponin I (High Sensitivity) 26 (H) <18 ng/L    Comment: (NOTE) Elevated high sensitivity troponin I (hsTnI) values and significant  changes across serial measurements may suggest ACS but many other  chronic and acute conditions are known to elevate hsTnI results.  Refer to the  "Links" section for chest pain algorithms and additional  guidance. Performed at [facility], [address] [place], [place] [address]   Brain natriuretic peptide     Status: Abnormal   Collection Time: 06/26/23  6:26 AM  Result Value Ref Range   B Natriuretic Peptide 455.8 (H) 0.0 - 100.0 pg/mL    Comment: Performed at [facility], [address], [place], [place] [address]  Lipase, blood     Status: None   Collection Time: 06/26/23  8:57 AM  Result Value Ref Range   Lipase 29 11 -  51 U/L    Comment: Performed at [facility], [address], [place], [place] [address]  Hepatic function panel     Status: Abnormal   Collection Time: 06/26/23  8:57 AM  Result Value Ref Range   Total Protein 6.0 (L) 6.5 - 8.1 g/dL   Albumin 3.0 (L) 3.5 - 5.0 g/dL   AST 23 15 - 41 U/L   ALT 18 0 - 44 U/L   Alkaline Phosphatase 76 38 - 126 U/L   Total Bilirubin 0.9 0.3 - 1.2 mg/dL   Bilirubin, Direct 0.3 (H) 0.0 - 0.2 mg/dL   Indirect Bilirubin 0.6 0.3 - 0.9 mg/dL    Comment: Performed at [facility], [address], [place], [place] [address]  Troponin I (High Sensitivity)     Status: Abnormal   Collection Time: 06/26/23  8:57 AM  Result Value Ref Range   Troponin I (High Sensitivity) 22 (H) <18 ng/L    Comment: (NOTE) Elevated high sensitivity troponin I (hsTnI) values and significant  changes across serial measurements may suggest ACS but many other  chronic and acute conditions are known to elevate hsTnI results.  Refer to the "Links" section for chest pain algorithms and additional  guidance. Performed at [facility], [address], [place], [place] [address]    US Abdomen Limited RUQ (LIVER/GB)  Result Date: 06/26/2023 CLINICAL DATA:  Abdominal pain EXAM: ULTRASOUND ABDOMEN LIMITED RIGHT UPPER QUADRANT COMPARISON:  None Available. FINDINGS: Gallbladder: Gallbladder wall thickening. Sludge versus small stone seen in the gallbladder neck. No sonographic  Murphy sign noted by sonographer. Common bile duct: Diameter: 5.5 mm Liver: No focal lesion identified. Increased parenchymal echogenicity. Portal vein is patent on color Doppler imaging with normal direction of blood flow towards the liver. Other: Evaluation limited due to patient body habitus. IMPRESSION: 1. Gallbladder wall thickening with sludge versus small stone in the gallbladder neck, findings are concerning for acute cholecystitis, although sonographic Murphy sign was negative. Recommend clinical correlation. 2. Hepatic steatosis. Electronically Signed   By: Allegra Lai M.D.   On: 06/26/2023 10:48   CT ABDOMEN PELVIS W CONTRAST  Result Date: 06/26/2023 CLINICAL DATA:  epigastric pain EXAM: CT ABDOMEN AND PELVIS WITH CONTRAST TECHNIQUE: Multidetector CT imaging of the abdomen and pelvis was performed using the standard protocol following bolus administration of intravenous contrast. RADIATION DOSE REDUCTION: This exam was performed according to the departmental dose-optimization program which includes automated exposure control, adjustment of the mA and/or kV according to patient size and/or use of iterative reconstruction technique. CONTRAST:  75mL OMNIPAQUE IOHEXOL 350 MG/ML SOLN COMPARISON:  CT scan abdomen and pelvis from 01/03/2021. FINDINGS: Lower chest: There are subpleural atelectatic changes in the visualized lung bases. No overt consolidation. No pleural effusion. The heart is normal in size. No pericardial effusion. Note is made of bilateral mild-to-moderate symmetric gynecomastia. Hepatobiliary: The liver is normal in size. Non-cirrhotic configuration. No suspicious mass. These is mild diffuse hepatic steatosis. No intrahepatic or extrahepatic bile duct dilation. There is small volume dependent sludge/tiny calculi in the gallbladder. There is mild diffuse gallbladder wall edema. There is mild pericholecystic fat stranding. There is suspected short segment of cystic duct which exhibits  hyperattenuating contents (series 6, image 117), concerning for sludge within the cystic duct. In appropriate clinical settings, these findings suggest early/subacute cholecystitis. Correlate clinically. Pancreas: Unremarkable. No pancreatic ductal dilatation or surrounding inflammatory changes. Spleen: Within normal limits. No focal lesion. Adrenals/Urinary Tract: Adrenal glands are unremarkable.  No suspicious renal mass. No hydronephrosis. No renal or ureteric calculi. Unremarkable urinary bladder. Stomach/Bowel: There is a small sliding hiatal hernia. There is a diverticulum arising from the second part of duodenum. No disproportionate dilation of the small or large bowel loops. No evidence of abnormal bowel wall thickening or inflammatory changes. The appendix is surgically absent. Vascular/Lymphatic: No ascites or pneumoperitoneum. No abdominal or pelvic lymphadenopathy, by size criteria. No aneurysmal dilation of the major abdominal arteries. There are mild peripheral atherosclerotic vascular calcifications of the aorta and its major branches. Reproductive: Normal-sized prostate gland exhibiting post TURP defect. Symmetric bilateral seminal vesicles. Other: There is a tiny fat containing umbilical hernia. Tiny right and small left fat containing inguinal hernia noted. Musculoskeletal: No suspicious osseous lesions. There are mild multilevel degenerative changes in the visualized spine. IMPRESSION: 1. Cholelithiasis with mild diffuse gallbladder wall edema and mild pericholecystic fat stranding. There is suspected short segment of cystic duct which exhibits hyperattenuating contents, concerning for sludge within the cystic duct. In appropriate clinical settings, these findings suggest early/subacute cholecystitis. 2. Multiple other nonacute observations, as described above. Aortic Atherosclerosis (ICD10-I70.0). Electronically Signed   By: Jules Schick M.D.   On: 06/26/2023 09:01   DG Chest 2 View  Result  Date: 06/26/2023 CLINICAL DATA:  Chest pain EXAM: CHEST - 2 VIEW COMPARISON:  05/26/2023 FINDINGS: Cardiac enlargement. Mild fissure thickening but no Kerley lines or cephalized blood flow. No effusion, pneumothorax, or consolidation. Coronary stenting. Interval defibrillator explant. IMPRESSION: No evidence of acute disease. Electronically Signed   By: Tiburcio Pea M.D.   On: 06/26/2023 06:44    Pending Labs Unresulted Labs (From admission, onward)     Start     Ordered   06/27/23 0500  CBC  Tomorrow morning,   R        06/26/23 1406   06/27/23 0500  Comprehensive metabolic panel  Tomorrow morning,   R        06/26/23 1406   06/27/23 0500  CBC  Daily,   R      06/26/23 1438   06/27/23 0500  Heparin level (unfractionated)  Daily,   R      06/26/23 1438   06/27/23 0500  APTT  Daily,   R      06/26/23 1438   06/27/23 0500  Lipid panel  Tomorrow morning,   R        06/26/23 1441   06/27/23 0500  Magnesium  Tomorrow morning,   R        06/26/23 1504   06/26/23 2300  APTT  Once-Timed,   TIMED        06/26/23 1438   06/26/23 2300  Heparin level (unfractionated)  Once-Timed,   TIMED        06/26/23 1438            Vitals/Pain Today's Vitals   06/26/23 1207 06/26/23 1230 06/26/23 1315 06/26/23 1408  BP:  135/82 134/65   Pulse:  73 77   Resp:  20 (!) 23   Temp: 98.5 F (36.9 C)     TempSrc: Oral     SpO2:  99% 97%   Weight:    222 lb 0.1 oz (100.7 kg)  Height:    5\' 10"  (1.778 m)  PainSc:        Isolation Precautions No active isolations  Medications Medications  ondansetron (ZOFRAN) injection 4 mg (0 mg Intravenous Hold 06/26/23 0812)  latanoprost (XALATAN) 0.005 % ophthalmic solution 1  drop (has no administration in time range)  heparin ADULT infusion 100 units/mL (25000 units/274mL) (1,300 Units/hr Intravenous New Bag/Given 06/26/23 1509)  rosuvastatin (CRESTOR) tablet 5 mg (has no administration in time range)  carvedilol (COREG) tablet 3.125 mg (has no  administration in time range)  allopurinol (ZYLOPRIM) tablet 100 mg (has no administration in time range)  iohexol (OMNIPAQUE) 350 MG/ML injection 75 mL (75 mLs Intravenous Contrast Given 06/26/23 0834)  piperacillin-tazobactam (ZOSYN) IVPB 3.375 g (0 g Intravenous Stopped 06/26/23 1421)    Mobility walks     Focused Assessments     R Recommendations: See Admitting Provider Note  Report given to:   Additional Notes:

## 2023-06-26 NOTE — Progress Notes (Signed)
PHARMACY - ANTICOAGULATION CONSULT NOTE  Pharmacy Consult for heparin  Indication: atrial fibrillation  Allergies  Allergen Reactions   Lipitor [Atorvastatin] Other (See Comments)    MUSCLE ACHE    Jardiance [Empagliflozin] Other (See Comments)    Intolerance     Patient Measurements:   Heparin Dosing Weight: 94.1kg   Vital Signs: Temp: 98.5 F (36.9 C) (10/19 1207) Temp Source: Oral (10/19 1207) BP: 134/65 (10/19 1315) Pulse Rate: 77 (10/19 1315)  Labs: Recent Labs    06/26/23 0626 06/26/23 0857  HGB 11.7*  --   HCT 36.3*  --   PLT 278  --   CREATININE 1.13  --   TROPONINIHS 26* 22*    Estimated Creatinine Clearance: 58.9 mL/min (by C-G formula based on SCr of 1.13 mg/dL).   Medical History: Past Medical History:  Diagnosis Date   AICD (automatic cardioverter/defibrillator) present    Arthritis    knees, back    BPH (benign prostatic hypertrophy)    CHF (congestive heart failure) (HCC)    Chronic kidney disease    BPH   Chronic systolic heart failure (HCC)    a. s/p MDT single chamber ICD 2004 as part of MASTER study b. upgrade to CRTD 2010; CRTD gen change 2016   Coronary artery disease    a. s/p anterior MI and CYPHER stent to LAD 2005   Dyslipidemia    Dysrhythmia    a-fib   Gout    Ischemic cardiomyopathy    Paroxysmal atrial fibrillation (HCC)    Ventricular tachycardia (HCC)     Assessment: Patient admitted with epigastric pain, concern for cholecystitis. Hx of Afib on Eliquis, last dose 10/18 PM. HgB 11.7 and PLTs 278. Pharmacy consulted to dose heparin gtt.  Will use aPTT for now in the setting of recent DOAC intake.   Goal of Therapy:  Heparin level 0.3-0.7 units/ml aPTT 66-103 Monitor platelets by anticoagulation protocol: Yes   Plan:  No bolus with recent DOAC intake.  Start heparin infusion at 1300 units/hr Check anti-Xa level in 8 hours and daily while on heparin, will check aPTT with recent DOAC intake.  Continue to monitor  H&H and platelets  Estill Batten, PharmD, BCCCP  06/26/2023,2:23 PM

## 2023-06-26 NOTE — Progress Notes (Signed)
ED Pharmacy Antibiotic Sign Off An antibiotic consult was received from an ED provider for Zosyn per pharmacy dosing for intra-abdominal infection. A chart review was completed to assess appropriateness.   The following one time order(s) were placed:  Zosyn 3.375g x1   Further antibiotic and/or antibiotic pharmacy consults should be ordered by the admitting provider if indicated.   Thank you for allowing pharmacy to be a part of this patient's care.   Estill Batten, PharmD, BCCCP  Clinical Pharmacist 06/26/23 12:43 PM

## 2023-06-26 NOTE — Consult Note (Signed)
[name] 05-Apr-1941  [id number].    Requesting MD: Renaye Rakers Chief Complaint/Reason for Consult: ?Cholecystitis  HPI:  82 y/o M w/ a hx of CHF (EF 15%), CAD, Afib on Eliquis, HTN, and Vfib s/p AICD placement and subsequent removal last month for infection who presented to the ED with several days of epigastric pain and nausea. He underwent an anal manometry study on Wednesday.  Following the procedure he reports that he had severe epigastric pain followed by emesis.  Since then he has continued to have frequent epigastric pain that was somewhat better but did not go away.  Last night he could not sleep due to the pain, prompting him to seek evaluation today. At the time of my exam, he reports that his pain has completely subsided.  He denies current complaints.   CT A/P showed cholelithiasis w/ mild GB wall thickening and pericholecystic fat stranding as well as ?sludge within the cystic duct.  US showed similar findings.  LFTs WNL.  WBC normal.  He is AF and HDS.  ROS: Review of Systems  Constitutional: Negative.   HENT: Negative.    Eyes: Negative.   Respiratory: Negative.    Cardiovascular: Negative.   Gastrointestinal:  Positive for abdominal pain and nausea.  Genitourinary: Negative.   Musculoskeletal: Negative.   Skin: Negative.   Neurological: Negative.   Endo/Heme/Allergies: Negative.   Psychiatric/Behavioral: Negative.      Family History  Problem Relation Age of Onset   Heart disease Sister        69 35f the 4 had HD   Heart disease Brother        2 of 3 had HD   Heart disease Sister        1 of the 6 has HD   Heart disease Brother     Past Medical History:  Diagnosis Date   AICD (automatic cardioverter/defibrillator) present    Arthritis    knees, back    BPH (benign prostatic hypertrophy)    CHF (congestive heart failure) (HCC)    Chronic kidney disease    BPH   Chronic systolic heart failure (HCC)    a. s/p MDT single chamber ICD 2004 as part of  MASTER study b. upgrade to CRTD 2010; CRTD gen change 2016   Coronary artery disease    a. s/p anterior MI and CYPHER stent to LAD 2005   Dyslipidemia    Dysrhythmia    a-fib   Gout    Ischemic cardiomyopathy    Paroxysmal atrial fibrillation (HCC)    Ventricular tachycardia (HCC)     Past Surgical History:  Procedure Laterality Date   APPENDECTOMY  1970   BI-VENTRICULAR IMPLANTABLE CARDIOVERTER DEFIBRILLATOR UPGRADE N/A 11/07/2014   a. MDT single chamber ICD implanted 2004 as part of MASTER study; upgrade to CRTD 2010; gen change 2016   BIV ICD GENERATOR CHANGEOUT N/A 11/12/2021   Procedure: BIV ICD GENERATOR CHANGEOUT;  Surgeon: Duke Salvia, MD;  Location: [facility] INVASIVE CV LAB;  Service: Cardiovascular;  Laterality: N/A;   CARDIAC CATHETERIZATION  09/08/2003   x3 total of 5 stents   CARDIOVERSION N/A 07/04/2019   Procedure: CARDIOVERSION;  Surgeon: Wendall Stade, MD;  Location: [facility] ENDOSCOPY;  Service: Cardiovascular;  Laterality: N/A;   CARDIOVERSION N/A 08/17/2019   Procedure: CARDIOVERSION;  Surgeon: Vesta Mixer, MD;  Location: [facility] ENDOSCOPY;  Service: Cardiovascular;  Laterality: N/A;   CARDIOVERSION N/A 05/04/2022   Procedure: CARDIOVERSION;  Surgeon: Quintella Reichert,  MD;  Location: [facility] ENDOSCOPY;  Service: Cardiovascular;  Laterality: N/A;   PROSTATECTOMY  11/06/2011   Procedure: PROSTATECTOMY SUPRAPUBIC;  Surgeon: Kathi Ludwig, MD;  Location: [facility] ORS;  Service: Urology;  Laterality: N/A;  Open Suprapubic Prostatectomy   TEE WITHOUT CARDIOVERSION N/A 05/16/2019   Procedure: TRANSESOPHAGEAL ECHOCARDIOGRAM (TEE);  Surgeon: Sande Rives, MD;  Location: [facility] ENDOSCOPY;  Service: Cardiology;  Laterality: N/A;   TOTAL KNEE ARTHROPLASTY Left 10/06/2021   Procedure: TOTAL KNEE ARTHROPLASTY;  Surgeon: Jodi Geralds, MD;  Location: [facility] ORS;  Service: Orthopedics;  Laterality: Left;    Social History:  reports that he has never smoked. He has never been exposed to  tobacco smoke. He has never used smokeless tobacco. He reports that he does not drink alcohol and does not use drugs.  Allergies:  Allergies  Allergen Reactions   Lipitor [Atorvastatin] Other (See Comments)    MUSCLE ACHE    Jardiance [Empagliflozin] Other (See Comments)    Intolerance     (Not in a hospital admission)   Physical Exam: Blood pressure 131/73, pulse 77, temperature 98.3 F (36.8 C), resp. rate 17, SpO2 94%. Gen: male resting in bed, NAD Resp: equal chest rise Abd: soft, non-distended, NT Neuro: moving all extremities   Results for orders placed or performed during the hospital encounter of 06/26/23 (from the past 48 hour(s))  Basic metabolic panel     Status: Abnormal   Collection Time: 06/26/23  6:26 AM  Result Value Ref Range   Sodium 138 135 - 145 mmol/L   Potassium 4.2 3.5 - 5.1 mmol/L   Chloride 102 98 - 111 mmol/L   CO2 25 22 - 32 mmol/L   Glucose, Bld 118 (H) 70 - 99 mg/dL    Comment: Glucose reference range applies only to samples taken after fasting for at least 8 hours.   BUN 17 8 - 23 mg/dL   Creatinine, Ser 1.91 0.61 - 1.24 mg/dL   Calcium 9.3 8.9 - 47.8 mg/dL   GFR, Estimated >29 >56 mL/min    Comment: (NOTE) Calculated using the CKD-EPI Creatinine Equation (2021)    Anion gap 11 5 - 15    Comment: Performed at [facility], [address], [place], [place] [address]  CBC     Status: Abnormal   Collection Time: 06/26/23  6:26 AM  Result Value Ref Range   WBC 10.2 4.0 - 10.5 K/uL   RBC 3.81 (L) 4.22 - 5.81 MIL/uL   Hemoglobin 11.7 (L) 13.0 - 17.0 g/dL   HCT 65.7 (L) 84.6 - 96.2 %   MCV 95.3 80.0 - 100.0 fL   MCH 30.7 26.0 - 34.0 pg   MCHC 32.2 30.0 - 36.0 g/dL   RDW 95.2 84.1 - 32.4 %   Platelets 278 150 - 400 K/uL   nRBC 0.0 0.0 - 0.2 %    Comment: Performed at [facility], [address], [place], [place] [address]  Troponin I (High Sensitivity)     Status: Abnormal   Collection Time: 06/26/23  6:26 AM  Result Value  Ref Range   Troponin I (High Sensitivity) 26 (H) <18 ng/L    Comment: (NOTE) Elevated high sensitivity troponin I (hsTnI) values and significant  changes across serial measurements may suggest ACS but many other  chronic and acute conditions are known to elevate hsTnI results.  Refer to the "Links" section for chest pain algorithms and additional  guidance. Performed at [facility],  [address], [place], [place] [address]   Brain natriuretic peptide     Status: Abnormal   Collection Time: 06/26/23  6:26 AM  Result Value Ref Range   B Natriuretic Peptide 455.8 (H) 0.0 - 100.0 pg/mL    Comment: Performed at [facility], [address], [place], [place] [address]  Lipase, blood     Status: None   Collection Time: 06/26/23  8:57 AM  Result Value Ref Range   Lipase 29 11 - 51 U/L    Comment: Performed at [facility], [address], [place], [place] [address]  Hepatic function panel     Status: Abnormal   Collection Time: 06/26/23  8:57 AM  Result Value Ref Range   Total Protein 6.0 (L) 6.5 - 8.1 g/dL   Albumin 3.0 (L) 3.5 - 5.0 g/dL   AST 23 15 - 41 U/L   ALT 18 0 - 44 U/L   Alkaline Phosphatase 76 38 - 126 U/L   Total Bilirubin 0.9 0.3 - 1.2 mg/dL   Bilirubin, Direct 0.3 (H) 0.0 - 0.2 mg/dL   Indirect Bilirubin 0.6 0.3 - 0.9 mg/dL    Comment: Performed at [facility], [address], [place], [place] [address]  Troponin I (High Sensitivity)     Status: Abnormal   Collection Time: 06/26/23  8:57 AM  Result Value Ref Range   Troponin I (High Sensitivity) 22 (H) <18 ng/L    Comment: (NOTE) Elevated high sensitivity troponin I (hsTnI) values and significant  changes across serial measurements may suggest ACS but many other  chronic and acute conditions are known to elevate hsTnI results.  Refer to the "Links" section for chest pain algorithms and additional  guidance. Performed at [facility], [address], [place], [place] [address]     US Abdomen Limited RUQ (LIVER/GB)  Result Date: 06/26/2023 CLINICAL DATA:  Abdominal pain EXAM: ULTRASOUND ABDOMEN LIMITED RIGHT UPPER QUADRANT COMPARISON:  None Available. FINDINGS: Gallbladder: Gallbladder wall thickening. Sludge versus small stone seen in the gallbladder neck. No sonographic Murphy sign noted by sonographer. Common bile duct: Diameter: 5.5 mm Liver: No focal lesion identified. Increased parenchymal echogenicity. Portal vein is patent on color Doppler imaging with normal direction of blood flow towards the liver. Other: Evaluation limited due to patient body habitus. IMPRESSION: 1. Gallbladder wall thickening with sludge versus small stone in the gallbladder neck, findings are concerning for acute cholecystitis, although sonographic Murphy sign was negative. Recommend clinical correlation. 2. Hepatic steatosis. Electronically Signed   By: Allegra Lai M.D.   On: 06/26/2023 10:48   CT ABDOMEN PELVIS W CONTRAST  Result Date: 06/26/2023 CLINICAL DATA:  epigastric pain EXAM: CT ABDOMEN AND PELVIS WITH CONTRAST TECHNIQUE: Multidetector CT imaging of the abdomen and pelvis was performed using the standard protocol following bolus administration of intravenous contrast. RADIATION DOSE REDUCTION: This exam was performed according to the departmental dose-optimization program which includes automated exposure control, adjustment of the mA and/or kV according to patient size and/or use of iterative reconstruction technique. CONTRAST:  75mL OMNIPAQUE IOHEXOL 350 MG/ML SOLN COMPARISON:  CT scan abdomen and pelvis from 01/03/2021. FINDINGS: Lower chest: There are subpleural atelectatic changes in the visualized lung bases. No overt consolidation. No pleural effusion. The heart is normal in size. No pericardial effusion. Note is made of bilateral mild-to-moderate symmetric gynecomastia. Hepatobiliary: The liver is normal in size. Non-cirrhotic configuration. No suspicious mass. These is mild  diffuse hepatic steatosis.  No intrahepatic or extrahepatic bile duct dilation. There is small volume dependent sludge/tiny calculi in the gallbladder. There is mild diffuse gallbladder wall edema. There is mild pericholecystic fat stranding. There is suspected short segment of cystic duct which exhibits hyperattenuating contents (series 6, image 117), concerning for sludge within the cystic duct. In appropriate clinical settings, these findings suggest early/subacute cholecystitis. Correlate clinically. Pancreas: Unremarkable. No pancreatic ductal dilatation or surrounding inflammatory changes. Spleen: Within normal limits. No focal lesion. Adrenals/Urinary Tract: Adrenal glands are unremarkable. No suspicious renal mass. No hydronephrosis. No renal or ureteric calculi. Unremarkable urinary bladder. Stomach/Bowel: There is a small sliding hiatal hernia. There is a diverticulum arising from the second part of duodenum. No disproportionate dilation of the small or large bowel loops. No evidence of abnormal bowel wall thickening or inflammatory changes. The appendix is surgically absent. Vascular/Lymphatic: No ascites or pneumoperitoneum. No abdominal or pelvic lymphadenopathy, by size criteria. No aneurysmal dilation of the major abdominal arteries. There are mild peripheral atherosclerotic vascular calcifications of the aorta and its major branches. Reproductive: Normal-sized prostate gland exhibiting post TURP defect. Symmetric bilateral seminal vesicles. Other: There is a tiny fat containing umbilical hernia. Tiny right and small left fat containing inguinal hernia noted. Musculoskeletal: No suspicious osseous lesions. There are mild multilevel degenerative changes in the visualized spine. IMPRESSION: 1. Cholelithiasis with mild diffuse gallbladder wall edema and mild pericholecystic fat stranding. There is suspected short segment of cystic duct which exhibits hyperattenuating contents, concerning for sludge within  the cystic duct. In appropriate clinical settings, these findings suggest early/subacute cholecystitis. 2. Multiple other nonacute observations, as described above. Aortic Atherosclerosis (ICD10-I70.0). Electronically Signed   By: Jules Schick M.D.   On: 06/26/2023 09:01   DG Chest 2 View  Result Date: 06/26/2023 CLINICAL DATA:  Chest pain EXAM: CHEST - 2 VIEW COMPARISON:  05/26/2023 FINDINGS: Cardiac enlargement. Mild fissure thickening but no Kerley lines or cephalized blood flow. No effusion, pneumothorax, or consolidation. Coronary stenting. Interval defibrillator explant. IMPRESSION: No evidence of acute disease. Electronically Signed   By: Tiburcio Pea M.D.   On: 06/26/2023 06:44    Assessment/Plan 82 y/o M w/ a significant cardiac history include CAD, CHF w/ reduced EF (15%), Afib on Eliquis, and Vtach s/p AICD placement and removal for infection who presents with abdominal pain and has imaging concerning for possible cholecystitis   - At the time of my exam, he was not reporting any abdominal pain.  The etiology of the imaging findings is unclear but may be related to cholecystitis versus heart failure - HIDA - Admit to medicine - If his HIDA is positive then he would likely need a perc chole tube as he has prohibitively high risk for a complication from surgery - It would be reasonable to start abx until HIDA returns - Surgery will continue to follow  FEN - NPO VTE - Hold Eliquis, Okay for DVT ppx ID - Zosyn Admit - Medicine  I reviewed last 24 h vitals and pain scores, last 24 h labs and trends, and last 24 h imaging results.  Tacy Learn [facility] 06/26/2023, 11:59 AM Please see Amion for pager number during day hours 7:00am-4:30pm or 7:00am -11:30am on weekends

## 2023-06-26 NOTE — ED Notes (Signed)
Surgery at bedside.

## 2023-06-26 NOTE — Progress Notes (Signed)
Pharmacy Antibiotic Note  [name] is a 82 y.o. male admitted on 06/26/2023 with  acute cholecystitis .  Pharmacy has been consulted for Zosyn dosing.  Plan: Zosyn 3.375gm IV q8h Will f/u micro data, renal function, and pt's clinical condition  Height: 5\' 10"  (177.8 cm) Weight: 100.7 kg (222 lb 0.1 oz) IBW/kg (Calculated) : 73  Temp (24hrs), Avg:98.3 F (36.8 C), Min:98 F (36.7 C), Max:98.5 F (36.9 C)  Recent Labs  Lab 06/26/23 0626  WBC 10.2  CREATININE 1.13    Estimated Creatinine Clearance: 58.9 mL/min (by C-G formula based on SCr of 1.13 mg/dL).    Allergies  Allergen Reactions   Lipitor [Atorvastatin] Other (See Comments)    MUSCLE ACHE    Jardiance [Empagliflozin] Other (See Comments)    Intolerance     Antimicrobials this admission: 10/19 Zosyn >>  Thank you for allowing pharmacy to be a part of this patient's care.  Christoper Fabian, PharmD, BCPS Please see amion for complete clinical pharmacist phone list 06/26/2023 6:07 PM

## 2023-06-26 NOTE — ED Notes (Signed)
Pt to xray

## 2023-06-26 NOTE — Plan of Care (Signed)
  Problem: Education: Goal: Knowledge of General Education information will improve Description: Including pain rating scale, medication(s)/side effects and non-pharmacologic comfort measures Outcome: Progressing   Problem: Activity: Goal: Risk for activity intolerance will decrease Outcome: Progressing   Problem: Pain Managment: Goal: General experience of comfort will improve Outcome: Progressing   

## 2023-06-26 NOTE — ED Provider Notes (Signed)
[place] [facility]MENT AT [facility] Provider Note   CSN: [id number] Arrival date & time: 06/26/23  5176     History  Chief Complaint  Patient presents with   Chest Pain    Hx. CAD/CHF    [name] is a 82 y.o. male status post AICD removal last month, history of CHF, CKD, atrial fibrillation on Eliquis, V. tach, CAD, hypertension presented with epigastric pain that began a few days ago.  Patient states that he had a "ex lap" done a few days ago and since then has had epigastric pain.  Patient states that the pain does result in him having nausea and vomiting and is worse when lying down.  Triage note states the patient is having shortness of breath and chest pain however with me he denies this.  Patient denies any dysuria or bowel symptoms.  Patient denies any abdominal distention or fevers.  Patient denies leg swelling or hemoptysis or cough.  Patient denies hematemesis.  Home Medications Prior to Admission medications   Medication Sig Start Date End Date Taking? Authorizing Provider  linezolid (ZYVOX) 600 MG tablet Take 600 mg by mouth 2 (two) times daily. 06/02/23  Yes [provider]  acetaminophen (TYLENOL) 325 MG tablet Take 2 tablets (650 mg total) by mouth every 4 (four) hours as needed for headache or mild pain. 05/27/23   Sheilah Pigeon, PA-C  allopurinol (ZYLOPRIM) 100 MG tablet Take 100 mg by mouth at bedtime. 05/14/15   [provider]  apixaban (ELIQUIS) 5 MG TABS tablet Take 5 mg by mouth 2 (two) times daily.    [provider]  carvedilol (COREG) 3.125 MG tablet TAKE 1 TABLET TWICE DAILY 06/15/22   Nahser, Deloris Ping, MD  furosemide (LASIX) 40 MG tablet Take 1 tablet (40 mg total) by mouth daily. 09/29/22   Duke Salvia, MD  latanoprost (XALATAN) 0.005 % ophthalmic solution Place 1 drop into both eyes at bedtime.  10/03/13   [provider]  levothyroxine (SYNTHROID) 125 MCG tablet Take 125 mcg by mouth daily  before breakfast.    [provider]  nitroGLYCERIN (NITROSTAT) 0.4 MG SL tablet Place 1 tablet (0.4 mg total) under the tongue every 5 (five) minutes as needed for chest pain. 12/12/19   Nahser, Deloris Ping, MD  Polyethyl Glycol-Propyl Glycol 0.4-0.3 % SOLN Place 1 drop into both eyes daily.    [provider]  potassium chloride (KLOR-CON) 10 MEQ tablet Take 1 tablet (10 mEq total) by mouth daily. Patient taking differently: Take 10 mEq by mouth 2 (two) times daily. 09/29/22   Duke Salvia, MD  rosuvastatin (CRESTOR) 10 MG tablet TAKE 1/2 TABLET EVERY DAY 03/24/23   Nahser, Deloris Ping, MD      Allergies    Lipitor [atorvastatin] and Jardiance [empagliflozin]    Review of Systems   Review of Systems  Cardiovascular:  Positive for chest pain.    Physical Exam Updated Vital Signs BP 131/73   Pulse 77   Temp 98.3 F (36.8 C)   Resp 17   SpO2 94%  Physical Exam Constitutional:      General: He is not in acute distress.    Comments: Does not appear fluid overloaded  Eyes:     Extraocular Movements: Extraocular movements intact.     Conjunctiva/sclera: Conjunctivae normal.     Pupils: Pupils are equal, round, and reactive to light.  Cardiovascular:     Rate and Rhythm: Normal rate and  regular rhythm.     Pulses: Normal pulses.     Heart sounds: Normal heart sounds.  Pulmonary:     Effort: Pulmonary effort is normal. No respiratory distress.     Breath sounds: Normal breath sounds.  Abdominal:     General: There is no distension.     Palpations: Abdomen is soft.     Tenderness: There is abdominal tenderness (Epigastric). There is no guarding or rebound.  Musculoskeletal:     Comments: No calf tenderness or lower leg edema noted  Skin:    General: Skin is warm and dry.     Comments: No surgical scars noted on abdomen  Neurological:     Mental Status: He is alert and oriented to person, place, and time.  Psychiatric:        Mood and Affect: Mood normal.      ED Results / Procedures / Treatments   Labs (all labs ordered are listed, but only abnormal results are displayed) Labs Reviewed  BASIC METABOLIC PANEL - Abnormal; Notable for the following components:      Result Value   Glucose, Bld 118 (*)    All other components within normal limits  CBC - Abnormal; Notable for the following components:   RBC 3.81 (*)    Hemoglobin 11.7 (*)    HCT 36.3 (*)    All other components within normal limits  BRAIN NATRIURETIC PEPTIDE - Abnormal; Notable for the following components:   B Natriuretic Peptide 455.8 (*)    All other components within normal limits  HEPATIC FUNCTION PANEL - Abnormal; Notable for the following components:   Total Protein 6.0 (*)    Albumin 3.0 (*)    Bilirubin, Direct 0.3 (*)    All other components within normal limits  TROPONIN I (HIGH SENSITIVITY) - Abnormal; Notable for the following components:   Troponin I (High Sensitivity) 26 (*)    All other components within normal limits  TROPONIN I (HIGH SENSITIVITY) - Abnormal; Notable for the following components:   Troponin I (High Sensitivity) 22 (*)    All other components within normal limits  LIPASE, BLOOD    EKG EKG Interpretation Date/Time:  Saturday June 26 2023 06:17:54 EDT Ventricular Rate:  81 PR Interval:    QRS Duration:  154 QT Interval:  420 QTC Calculation: 487 R Axis:   -83  Text Interpretation: Atrial fibrillation Indeterminate axis Non-specific intra-ventricular conduction block When compared with ECG of 14-Jun-2023 09:12, PREVIOUS ECG IS PRESENT No significant changes from prior tracing Confirmed by Alvester Chou [phone]) on 06/26/2023 7:03:19 AM  Radiology US Abdomen Limited RUQ (LIVER/GB)  Result Date: 06/26/2023 CLINICAL DATA:  Abdominal pain EXAM: ULTRASOUND ABDOMEN LIMITED RIGHT UPPER QUADRANT COMPARISON:  None Available. FINDINGS: Gallbladder: Gallbladder wall thickening. Sludge versus small stone seen in the gallbladder neck.  No sonographic Murphy sign noted by sonographer. Common bile duct: Diameter: 5.5 mm Liver: No focal lesion identified. Increased parenchymal echogenicity. Portal vein is patent on color Doppler imaging with normal direction of blood flow towards the liver. Other: Evaluation limited due to patient body habitus. IMPRESSION: 1. Gallbladder wall thickening with sludge versus small stone in the gallbladder neck, findings are concerning for acute cholecystitis, although sonographic Murphy sign was negative. Recommend clinical correlation. 2. Hepatic steatosis. Electronically Signed   By: Allegra Lai M.D.   On: 06/26/2023 10:48   CT ABDOMEN PELVIS W CONTRAST  Result Date: 06/26/2023 CLINICAL DATA:  epigastric pain EXAM: CT ABDOMEN AND PELVIS WITH CONTRAST  TECHNIQUE: Multidetector CT imaging of the abdomen and pelvis was performed using the standard protocol following bolus administration of intravenous contrast. RADIATION DOSE REDUCTION: This exam was performed according to the departmental dose-optimization program which includes automated exposure control, adjustment of the mA and/or kV according to patient size and/or use of iterative reconstruction technique. CONTRAST:  75mL OMNIPAQUE IOHEXOL 350 MG/ML SOLN COMPARISON:  CT scan abdomen and pelvis from 01/03/2021. FINDINGS: Lower chest: There are subpleural atelectatic changes in the visualized lung bases. No overt consolidation. No pleural effusion. The heart is normal in size. No pericardial effusion. Note is made of bilateral mild-to-moderate symmetric gynecomastia. Hepatobiliary: The liver is normal in size. Non-cirrhotic configuration. No suspicious mass. These is mild diffuse hepatic steatosis. No intrahepatic or extrahepatic bile duct dilation. There is small volume dependent sludge/tiny calculi in the gallbladder. There is mild diffuse gallbladder wall edema. There is mild pericholecystic fat stranding. There is suspected short segment of cystic duct  which exhibits hyperattenuating contents (series 6, image 117), concerning for sludge within the cystic duct. In appropriate clinical settings, these findings suggest early/subacute cholecystitis. Correlate clinically. Pancreas: Unremarkable. No pancreatic ductal dilatation or surrounding inflammatory changes. Spleen: Within normal limits. No focal lesion. Adrenals/Urinary Tract: Adrenal glands are unremarkable. No suspicious renal mass. No hydronephrosis. No renal or ureteric calculi. Unremarkable urinary bladder. Stomach/Bowel: There is a small sliding hiatal hernia. There is a diverticulum arising from the second part of duodenum. No disproportionate dilation of the small or large bowel loops. No evidence of abnormal bowel wall thickening or inflammatory changes. The appendix is surgically absent. Vascular/Lymphatic: No ascites or pneumoperitoneum. No abdominal or pelvic lymphadenopathy, by size criteria. No aneurysmal dilation of the major abdominal arteries. There are mild peripheral atherosclerotic vascular calcifications of the aorta and its major branches. Reproductive: Normal-sized prostate gland exhibiting post TURP defect. Symmetric bilateral seminal vesicles. Other: There is a tiny fat containing umbilical hernia. Tiny right and small left fat containing inguinal hernia noted. Musculoskeletal: No suspicious osseous lesions. There are mild multilevel degenerative changes in the visualized spine. IMPRESSION: 1. Cholelithiasis with mild diffuse gallbladder wall edema and mild pericholecystic fat stranding. There is suspected short segment of cystic duct which exhibits hyperattenuating contents, concerning for sludge within the cystic duct. In appropriate clinical settings, these findings suggest early/subacute cholecystitis. 2. Multiple other nonacute observations, as described above. Aortic Atherosclerosis (ICD10-I70.0). Electronically Signed   By: Jules Schick M.D.   On: 06/26/2023 09:01   DG Chest 2  View  Result Date: 06/26/2023 CLINICAL DATA:  Chest pain EXAM: CHEST - 2 VIEW COMPARISON:  05/26/2023 FINDINGS: Cardiac enlargement. Mild fissure thickening but no Kerley lines or cephalized blood flow. No effusion, pneumothorax, or consolidation. Coronary stenting. Interval defibrillator explant. IMPRESSION: No evidence of acute disease. Electronically Signed   By: Tiburcio Pea M.D.   On: 06/26/2023 06:44    Procedures Procedures    Medications Ordered in ED Medications  ondansetron (ZOFRAN) injection 4 mg (0 mg Intravenous Hold 06/26/23 0812)  iohexol (OMNIPAQUE) 350 MG/ML injection 75 mL (75 mLs Intravenous Contrast Given 06/26/23 0834)    ED Course/ Medical Decision Making/ A&P Clinical Course as of 06/26/23 1156  Sat Jun 26, 2023  0751 82 yo male presenting to ED with epigastric pain, specific with lying down at night for past 2 nights.  On 06/23/23 he had anal manometry performed at [facility] with balloon expulsion test (failed).  Reports he had regular BM yesterday.  But when he lies down he has  sharp epigastric pain.  +Nausea as well.  Normally on eliquis for A Fib but held morning dose today.  He is comfortable on exam, mild epigastric ttp, no rigidity.  Pending labs and likely CT abdomen pelvis to eval for pancreatitis, post -op complication, perforation, etc.  No signs of sepsis. [MT]  [phone] Of note he is a complicated cardiac patient who had an infected AICD removed at [facility] 3 weeks ago per my review of records, completed IV antibiotics course afterwards.  He is not having active chest pain and I do not believe that this issue is related to a cardiac condition or aortic dissection at this time.  His EKG per my interpretation shows stable atrial fibrillation that is rate controlled. [MT]    Clinical Course User Index [MT] Trifan, Kermit Balo, MD                                 Medical Decision Making Amount and/or Complexity of Data Reviewed Labs: ordered. Radiology:  ordered.  Risk Prescription drug management.   Adyen L Hapke 82 y.o. presented today for epigastric pain. Working DDx that I considered at this time includes, but not limited to, gastroenteritis, colitis, small bowel obstruction, appendicitis, cholecystitis, hepatobiliary pathology, gastritis, PUD, ACS, aortic dissection pancreatitis, nephrolithiasis, AAA, UTI, pyelonephritis, testicular torsion.  R/o DDx: CHF, gastroenteritis, colitis, small bowel obstruction, appendicitis, gastritis, PUD, ACS, aortic dissection pancreatitis, nephrolithiasis, AAA, UTI, pyelonephritis, testicular torsion: These are considered less likely due to history of present illness, physical exam, labs/imaging findings.  Review of prior external notes: 06/23/2023 discharge  Unique Tests and My Interpretation:  CBC with differential: Unremarkable BMP: Unremarkable Hepatic function panel: Unremarkable Troponin: 26, 22 Lipase: Unremarkable BNP: Elevated 455.8 EKG: A-fib 81 bpm, left branch block that has been seen previously, no ST elevations or depressions noted CT Abd/Pelvis with contrast: Cholelithiasis possibly acute versus subacute cholecystitis  Chest x-ray: Unremarkable Right quadrant ultrasound: Acute cholecystitis is suspected  Discussion with Independent Historian: None  Discussion of Management of Tests:  Hillery Hunter, MD General Surgery; Welton Flakes, MD IM Resident  Risk: High: hospitalization or escalation of hospital-level care  Risk Stratification Score: none  Staffed with Trifan, MD   Plan: On exam patient was no acute distress stable vitals.  Patient did have epigastric tenderness on exam without peritoneal signs and the rest of exam was unremarkable.  Patient does note that his epigastric pain is worse and lying down he is having nausea and vomiting which is suspicious for pancreatitis.  Patient denies history of pancreatitis.  Patient reports having a "ex lap" however I do not see any surgical scars on  his abdomen and cannot find history of ex lap however upon chart review it does appear patient anorectal manometry done 3 days ago which is most likely the surgery he is referring to.  Patient did have an AICD removed a month ago.  Given patient's history and physical exam we will scan for pancreatitis and give Zofran for his nausea.  Patient does have elevated BNP with no previous to compare however does not appear fluid overloaded on exams and does not have effusions noted on the chest x-ray or congestion and so doubt CHF exacerbation at this point.  CT scan with my independent interpretation does show enlarged gallbladder and the radiologist does comment on this being possibly acute versus subacute cholecystitis with cholelithiasis present.  Will obtain right upper quadrant ultrasound.  Still waiting for  LFTs and lipase.  Rest of labs are reassuring.  Ultrasound does show acute cholecystitis and so general surgery will be consulted.  Patient last ate yesterday and had a few sips of water at around 10:50 AM today but otherwise has been n.p.o.  I spoke to the general surgeon on-call and he recommends admission to medicine with HIDA scan and that he will come and evaluate patient.  Hospitalist consulted.  Patient started on Zosyn.  I spoke to hospitalist and patient is excepted for admission.  Patient stable for admission at this time.  This chart was dictated using voice recognition software.  Despite best efforts to proofread,  errors can occur which can change the documentation meaning.         Final Clinical Impression(s) / ED Diagnoses Final diagnoses:  Cholecystitis    Rx / DC Orders ED Discharge Orders     None         Remi Deter 06/26/23 1230    Terald Sleeper, MD 06/26/23 [phone]

## 2023-06-26 NOTE — H&P (Incomplete)
Date: 06/26/2023               Patient Name:  [name] MRN: [id number]  DOB: Feb 05, 1941 Age / Sex: 82 y.o., male   PCP: Kaleen Mask, MD         Medical Service: Internal Medicine Teaching Service         Attending Physician: Dr. Renaye Rakers Kermit Balo, MD       *Please do not leave messages for physician in this sticky note. Please page appropriate physician as below.*   Weekday Hours (7AM-5PM):   First Contact: Priscila Colbert Coyer, MD        Pager: [phone]        Second Contact: Gwenevere Abbot, MD    Pager: Meryl Dare [phone]    ** If no return call within 15 minutes (after trying both pagers listed above), please call after hours pagers.   After 5 pm or weekends:  1st Contact: Pager: [phone]  2nd Contact: Pager: [phone]     Chief Complaint: abdominal pain  History of Present Illness:  Pain in right side of stomach on Wednesday.  Pain worse with laying flat.  Not able to eat much due to pain and nausea.  Had an episode of vomiting. Pepto was helping some.  Symptoms worsened  Pain is continuous sharp pain.  Stopped hurting when he arrived to the ED.  Eating makes this worse.  Had been having pain with food and diarrhea for weeks.  No hematemesis.  Last BM was Thursday.   No hx of gallstones, alcohol abuse, no abdominal surgeries.   Review of Systems negative unless stated in the HPI.  States has hx of infections.   Last saw Dr. Clide Cliff early October.   In the ED, imaging was obtained that showed acute cholecystitis. Started on IV zosyn and surgery was consulted for evaluation and IM was consulted for admission.   Past Medical History: Past Medical History:  Diagnosis Date   AICD (automatic cardioverter/defibrillator) present    Arthritis    knees, back    BPH (benign prostatic hypertrophy)    CHF (congestive heart failure) (HCC)    Chronic kidney disease    BPH   Chronic systolic heart failure (HCC)    a. s/p MDT single  chamber ICD 2004 as part of MASTER study b. upgrade to CRTD 2010; CRTD gen change 2016   Coronary artery disease    a. s/p anterior MI and CYPHER stent to LAD 2005   Dyslipidemia    Dysrhythmia    a-fib   Gout    Ischemic cardiomyopathy    Paroxysmal atrial fibrillation (HCC)    Ventricular tachycardia (HCC)     Meds: Current Outpatient Medications  Medication Instructions   acetaminophen (TYLENOL) 650 mg, Oral, Every 4 hours PRN   allopurinol (ZYLOPRIM) 100 mg, Oral, Daily at bedtime   apixaban (ELIQUIS) 5 mg, Oral, 2 times daily   carvedilol (COREG) 3.125 MG tablet TAKE 1 TABLET TWICE DAILY   furosemide (LASIX) 40 mg, Oral, Daily   latanoprost (XALATAN) 0.005 % ophthalmic solution 1 drop, Both Eyes, Daily at bedtime   levothyroxine (SYNTHROID) 125 mcg, Oral, Daily before breakfast   linezolid (ZYVOX) 600 mg, Oral, 2 times daily   nitroGLYCERIN (NITROSTAT) 0.4 mg, Sublingual, Every 5 min PRN   Polyethyl Glycol-Propyl Glycol 0.4-0.3 % SOLN 1 drop, Both Eyes, Daily   potassium chloride (KLOR-CON) 10 MEQ tablet 10 mEq, Oral, Daily   rosuvastatin (CRESTOR) 5 mg,  Oral, Daily    Allergies: Allergies as of 06/26/2023 - Reviewed 06/26/2023  Allergen Reaction Noted   Lipitor [atorvastatin] Other (See Comments) 12/11/2013   Jardiance [empagliflozin] Other (See Comments) 05/25/2022    Past Surgical History: Past Surgical History:  Procedure Laterality Date   APPENDECTOMY  1970   BI-VENTRICULAR IMPLANTABLE CARDIOVERTER DEFIBRILLATOR UPGRADE N/A 11/07/2014   a. MDT single chamber ICD implanted 2004 as part of MASTER study; upgrade to CRTD 2010; gen change 2016   BIV ICD GENERATOR CHANGEOUT N/A 11/12/2021   Procedure: BIV ICD GENERATOR CHANGEOUT;  Surgeon: Duke Salvia, MD;  Location: [facility] INVASIVE CV LAB;  Service: Cardiovascular;  Laterality: N/A;   CARDIAC CATHETERIZATION  09/08/2003   x3 total of 5 stents   CARDIOVERSION N/A 07/04/2019   Procedure: CARDIOVERSION;  Surgeon:  Wendall Stade, MD;  Location: [facility] ENDOSCOPY;  Service: Cardiovascular;  Laterality: N/A;   CARDIOVERSION N/A 08/17/2019   Procedure: CARDIOVERSION;  Surgeon: Vesta Mixer, MD;  Location: [facility] ENDOSCOPY;  Service: Cardiovascular;  Laterality: N/A;   CARDIOVERSION N/A 05/04/2022   Procedure: CARDIOVERSION;  Surgeon: Quintella Reichert, MD;  Location: [facility] ENDOSCOPY;  Service: Cardiovascular;  Laterality: N/A;   PROSTATECTOMY  11/06/2011   Procedure: PROSTATECTOMY SUPRAPUBIC;  Surgeon: Kathi Ludwig, MD;  Location: [facility] ORS;  Service: Urology;  Laterality: N/A;  Open Suprapubic Prostatectomy   TEE WITHOUT CARDIOVERSION N/A 05/16/2019   Procedure: TRANSESOPHAGEAL ECHOCARDIOGRAM (TEE);  Surgeon: Sande Rives, MD;  Location: [facility] ENDOSCOPY;  Service: Cardiology;  Laterality: N/A;   TOTAL KNEE ARTHROPLASTY Left 10/06/2021   Procedure: TOTAL KNEE ARTHROPLASTY;  Surgeon: Jodi Geralds, MD;  Location: [facility] ORS;  Service: Orthopedics;  Laterality: Left;    Family History:  Family History  Problem Relation Age of Onset   Heart disease Sister        3 68f the 4 had HD   Heart disease Brother        2 of 3 had HD   Heart disease Sister        1 of the 6 has HD   Heart disease Brother     Social History:  Lives with Currently works at/ unemployed currently Tobacco- *** ppd x **** years since age/ Denies use. EtOH- *** shots/ week, *** beers/week. Last drink was ***. Denies use.  Illicit drug use- denies use.  IADLs/ADLs- can person independently at baseline     Physical Exam: Blood pressure 135/82, pulse 73, temperature 98.5 F (36.9 C), temperature source Oral, resp. rate 20, SpO2 99%. General:  HENT: Lungs:  Cardiovascular:  Abdomen: MSK:  Skin:  Neuro:  Psych:   Diagnostics:     Latest Ref Rng & Units 06/26/2023    6:26 AM 06/14/2023   10:43 AM 05/26/2023    2:47 PM  CBC  WBC 4.0 - 10.5 K/uL 10.2  9.9  6.4   Hemoglobin 13.0 - 17.0 g/dL 36.6  44.0  34.7   Hematocrit  39.0 - 52.0 % 36.3  38.9  43.2   Platelets 150 - 400 K/uL 278  224  246        Latest Ref Rng & Units 06/26/2023    8:57 AM 06/26/2023    6:26 AM 05/27/2023    5:26 AM  CMP  Glucose 70 - 99 mg/dL  425  956   BUN 8 - 23 mg/dL  17  17   Creatinine 3.87 - 1.24 mg/dL  5.64  3.32   Sodium 951 - 145 mmol/L  138  138   Potassium 3.5 - 5.1 mmol/L  4.2  4.0   Chloride 98 - 111 mmol/L  102  105   CO2 22 - 32 mmol/L  25  24   Calcium 8.9 - 10.3 mg/dL  9.3  8.3   Total Protein 6.5 - 8.1 g/dL 6.0     Total Bilirubin 0.3 - 1.2 mg/dL 0.9     Alkaline Phos 38 - 126 U/L 76     AST 15 - 41 U/L 23     ALT 0 - 44 U/L 18       US Abdomen Limited RUQ (LIVER/GB)  Result Date: 06/26/2023 CLINICAL DATA:  Abdominal pain EXAM: ULTRASOUND ABDOMEN LIMITED RIGHT UPPER QUADRANT COMPARISON:  None Available. FINDINGS: Gallbladder: Gallbladder wall thickening. Sludge versus small stone seen in the gallbladder neck. No sonographic Murphy sign noted by sonographer. Common bile duct: Diameter: 5.5 mm Liver: No focal lesion identified. Increased parenchymal echogenicity. Portal vein is patent on color Doppler imaging with normal direction of blood flow towards the liver. Other: Evaluation limited due to patient body habitus. IMPRESSION: 1. Gallbladder wall thickening with sludge versus small stone in the gallbladder neck, findings are concerning for acute cholecystitis, although sonographic Murphy sign was negative. Recommend clinical correlation. 2. Hepatic steatosis. Electronically Signed   By: Allegra Lai M.D.   On: 06/26/2023 10:48   CT ABDOMEN PELVIS W CONTRAST  Result Date: 06/26/2023 CLINICAL DATA:  epigastric pain EXAM: CT ABDOMEN AND PELVIS WITH CONTRAST TECHNIQUE: Multidetector CT imaging of the abdomen and pelvis was performed using the standard protocol following bolus administration of intravenous contrast. RADIATION DOSE REDUCTION: This exam was performed according to the departmental dose-optimization  program which includes automated exposure control, adjustment of the mA and/or kV according to patient size and/or use of iterative reconstruction technique. CONTRAST:  75mL OMNIPAQUE IOHEXOL 350 MG/ML SOLN COMPARISON:  CT scan abdomen and pelvis from 01/03/2021. FINDINGS: Lower chest: There are subpleural atelectatic changes in the visualized lung bases. No overt consolidation. No pleural effusion. The heart is normal in size. No pericardial effusion. Note is made of bilateral mild-to-moderate symmetric gynecomastia. Hepatobiliary: The liver is normal in size. Non-cirrhotic configuration. No suspicious mass. These is mild diffuse hepatic steatosis. No intrahepatic or extrahepatic bile duct dilation. There is small volume dependent sludge/tiny calculi in the gallbladder. There is mild diffuse gallbladder wall edema. There is mild pericholecystic fat stranding. There is suspected short segment of cystic duct which exhibits hyperattenuating contents (series 6, image 117), concerning for sludge within the cystic duct. In appropriate clinical settings, these findings suggest early/subacute cholecystitis. Correlate clinically. Pancreas: Unremarkable. No pancreatic ductal dilatation or surrounding inflammatory changes. Spleen: Within normal limits. No focal lesion. Adrenals/Urinary Tract: Adrenal glands are unremarkable. No suspicious renal mass. No hydronephrosis. No renal or ureteric calculi. Unremarkable urinary bladder. Stomach/Bowel: There is a small sliding hiatal hernia. There is a diverticulum arising from the second part of duodenum. No disproportionate dilation of the small or large bowel loops. No evidence of abnormal bowel wall thickening or inflammatory changes. The appendix is surgically absent. Vascular/Lymphatic: No ascites or pneumoperitoneum. No abdominal or pelvic lymphadenopathy, by size criteria. No aneurysmal dilation of the major abdominal arteries. There are mild peripheral atherosclerotic vascular  calcifications of the aorta and its major branches. Reproductive: Normal-sized prostate gland exhibiting post TURP defect. Symmetric bilateral seminal vesicles. Other: There is a tiny fat containing umbilical hernia. Tiny right and small left fat containing inguinal hernia noted. Musculoskeletal: No suspicious  osseous lesions. There are mild multilevel degenerative changes in the visualized spine. IMPRESSION: 1. Cholelithiasis with mild diffuse gallbladder wall edema and mild pericholecystic fat stranding. There is suspected short segment of cystic duct which exhibits hyperattenuating contents, concerning for sludge within the cystic duct. In appropriate clinical settings, these findings suggest early/subacute cholecystitis. 2. Multiple other nonacute observations, as described above. Aortic Atherosclerosis (ICD10-I70.0). Electronically Signed   By: Jules Schick M.D.   On: 06/26/2023 09:01   DG Chest 2 View  Result Date: 06/26/2023 CLINICAL DATA:  Chest pain EXAM: CHEST - 2 VIEW COMPARISON:  05/26/2023 FINDINGS: Cardiac enlargement. Mild fissure thickening but no Kerley lines or cephalized blood flow. No effusion, pneumothorax, or consolidation. Coronary stenting. Interval defibrillator explant. IMPRESSION: No evidence of acute disease. Electronically Signed   By: Tiburcio Pea M.D.   On: 06/26/2023 06:44     EKG: personally reviewed my interpretation is  CXR: personally reviewed my interpretation is  Assessment & Plan by Problem:  Present on Admission: **None**    DVT prophx: Diet: Bowel: PRN Code:  Prior to Admission Living Arrangement: Home Anticipated Discharge Location: Home Barriers to Discharge: Medical Workup  Dispo: Admit patient to Inpatient with expected length of stay greater than 2 midnights.  Gwenevere Abbot, MD Eligha Bridegroom. [facility] Internal Medicine Residency, PGY-3 Pager: [phone]

## 2023-06-27 ENCOUNTER — Encounter (HOSPITAL_COMMUNITY): Payer: Self-pay | Admitting: Gastroenterology

## 2023-06-27 DIAGNOSIS — K810 Acute cholecystitis: Secondary | ICD-10-CM

## 2023-06-27 LAB — COMPREHENSIVE METABOLIC PANEL
ALT: 16 U/L (ref 0–44)
AST: 22 U/L (ref 15–41)
Albumin: 2.9 g/dL — ABNORMAL LOW (ref 3.5–5.0)
Alkaline Phosphatase: 87 U/L (ref 38–126)
Anion gap: 9 (ref 5–15)
BUN: 16 mg/dL (ref 8–23)
CO2: 25 mmol/L (ref 22–32)
Calcium: 8.6 mg/dL — ABNORMAL LOW (ref 8.9–10.3)
Chloride: 101 mmol/L (ref 98–111)
Creatinine, Ser: 1.34 mg/dL — ABNORMAL HIGH (ref 0.61–1.24)
GFR, Estimated: 53 mL/min — ABNORMAL LOW (ref >60)
Glucose, Bld: 235 mg/dL — ABNORMAL HIGH (ref 70–99)
Potassium: 3.9 mmol/L (ref 3.5–5.1)
Sodium: 135 mmol/L (ref 135–145)
Total Bilirubin: 1.1 mg/dL (ref 0.3–1.2)
Total Protein: 6 g/dL — ABNORMAL LOW (ref 6.5–8.1)

## 2023-06-27 LAB — LIPID PANEL
Cholesterol: 82 mg/dL (ref 0–200)
HDL: 25 mg/dL — ABNORMAL LOW (ref >40)
LDL Cholesterol: 40 mg/dL (ref 0–99)
Total CHOL/HDL Ratio: 3.3 {ratio}
Triglycerides: 84 mg/dL (ref <150)
VLDL: 17 mg/dL (ref 0–40)

## 2023-06-27 LAB — CBC
HCT: 32.4 % — ABNORMAL LOW (ref 39.0–52.0)
Hemoglobin: 10.3 g/dL — ABNORMAL LOW (ref 13.0–17.0)
MCH: 30.4 pg (ref 26.0–34.0)
MCHC: 31.8 g/dL (ref 30.0–36.0)
MCV: 95.6 fL (ref 80.0–100.0)
Platelets: 251 10*3/uL (ref 150–400)
RBC: 3.39 MIL/uL — ABNORMAL LOW (ref 4.22–5.81)
RDW: 13.8 % (ref 11.5–15.5)
WBC: 6.2 10*3/uL (ref 4.0–10.5)
nRBC: 0 % (ref 0.0–0.2)

## 2023-06-27 LAB — APTT
aPTT: 137 s — ABNORMAL HIGH (ref 24–36)
aPTT: 40 s — ABNORMAL HIGH (ref 24–36)
aPTT: 52 s — ABNORMAL HIGH (ref 24–36)

## 2023-06-27 LAB — MAGNESIUM: Magnesium: 1.9 mg/dL (ref 1.7–2.4)

## 2023-06-27 LAB — HEMOGLOBIN A1C
Hgb A1c MFr Bld: 6.3 % — ABNORMAL HIGH (ref 4.8–5.6)
Mean Plasma Glucose: 134.11 mg/dL

## 2023-06-27 LAB — GLUCOSE, CAPILLARY
Glucose-Capillary: 96 mg/dL (ref 70–99)
Glucose-Capillary: 144 mg/dL — ABNORMAL HIGH (ref 70–99)
Glucose-Capillary: 125 mg/dL — ABNORMAL HIGH (ref 70–99)

## 2023-06-27 LAB — HEPARIN LEVEL (UNFRACTIONATED): Heparin Unfractionated: >1.1 [IU]/mL — ABNORMAL HIGH (ref 0.30–0.70)

## 2023-06-27 MED ORDER — FUROSEMIDE 40 MG PO TABS
40.0000 mg | ORAL_TABLET | Freq: Every day | ORAL | Status: DC
  Administered 2023-06-27 – 2023-06-28 (×2): 40 mg via ORAL
  Filled 2023-06-27 (×2): qty 1

## 2023-06-27 MED ORDER — HEPARIN (PORCINE) 25000 UT/250ML-% IV SOLN
1650.0000 [IU]/h | INTRAVENOUS | Status: DC
  Filled 2023-06-27: qty 250

## 2023-06-27 MED ORDER — POTASSIUM CHLORIDE CRYS ER 10 MEQ PO TBCR
10.0000 meq | EXTENDED_RELEASE_TABLET | Freq: Every day | ORAL | Status: DC
  Administered 2023-06-27 – 2023-07-01 (×5): 10 meq via ORAL
  Filled 2023-06-27 (×5): qty 1

## 2023-06-27 MED ORDER — INSULIN ASPART 100 UNIT/ML IJ SOLN
0.0000 [IU] | Freq: Three times a day (TID) | INTRAMUSCULAR | Status: DC
  Administered 2023-06-27 – 2023-07-01 (×3): 1 [IU] via SUBCUTANEOUS
  Administered 2023-07-01: 3 [IU] via SUBCUTANEOUS

## 2023-06-27 NOTE — Progress Notes (Signed)
PHARMACY - ANTICOAGULATION CONSULT NOTE  Pharmacy Consult for heparin  Indication: atrial fibrillation  Allergies  Allergen Reactions   Lipitor [Atorvastatin] Other (See Comments)    MUSCLE ACHE    Jardiance [Empagliflozin] Other (See Comments)    Intolerance     Patient Measurements: Height: 5\' 10"  (177.8 cm) Weight: 100.7 kg (222 lb 0.1 oz) IBW/kg (Calculated) : 73 Heparin Dosing Weight: 94.1kg   Vital Signs: Temp: 98.3 F (36.8 C) (10/20 0751) BP: 125/71 (10/20 0751) Pulse Rate: 80 (10/20 0751)  Labs: Recent Labs    06/26/23 0626 06/26/23 0857 06/26/23 2341 06/26/23 2342 06/27/23 0919  HGB 11.7*  --   --   --  10.3*  HCT 36.3*  --   --   --  32.4*  PLT 278  --   --   --  251  APTT  --   --  52*  --  40*  HEPARINUNFRC  --   --   --  >1.10*  --   CREATININE 1.13  --   --   --   --   TROPONINIHS 26* 22*  --   --   --     Estimated Creatinine Clearance: 58.9 mL/min (by C-G formula based on SCr of 1.13 mg/dL).   Medical History: Past Medical History:  Diagnosis Date   AICD (automatic cardioverter/defibrillator) present    Arthritis    knees, back    BPH (benign prostatic hypertrophy)    CHF (congestive heart failure) (HCC)    Chronic kidney disease    BPH   Chronic systolic heart failure (HCC)    a. s/p MDT single chamber ICD 2004 as part of MASTER study b. upgrade to CRTD 2010; CRTD gen change 2016   Coronary artery disease    a. s/p anterior MI and CYPHER stent to LAD 2005   Dyslipidemia    Dysrhythmia    a-fib   Gout    Ischemic cardiomyopathy    Paroxysmal atrial fibrillation (HCC)    Ventricular tachycardia (HCC)     Assessment: Patient admitted with epigastric pain, concern for cholecystitis. Hx of Afib on Eliquis, last dose 10/18 PM. HgB 11.7 and PLTs 278. Pharmacy consulted to dose heparin gtt. Will use aPTT for now in the setting of recent DOAC intake.   Initial heparin level >1.1 as expected, aPTT subtherapeutic at 40 seconds on 1550  units/hr. Hgb dropped slightly to 10.3, PLT WNL. No infusion issues or bleeding concerns per RN.  Goal of Therapy:  Heparin level 0.3-0.7 units/ml aPTT 66-103 Monitor platelets by anticoagulation protocol: Yes   Plan:  Increase heparin to 1800 units/hr Repeat aPTT in 8h aPTT and HL and CBC daily while on heparin  Lennie Muckle, PharmD PGY1 Pharmacy Resident 06/27/2023 10:24 AM

## 2023-06-27 NOTE — Progress Notes (Addendum)
Subjective:  Patient was seen and examined in his chair this morning. He expressed some difficulties sleeping overnight due to discomfort with his hospital bed. Patient was able to eat today and tolerated his food well. Discussed plan of HIDA scan per surgery recommendations. Patient had some questions about what procedure surgery team will complete. Deferred decision to surgery team pending results of HIDA. He has not had a bowel movement in the hospital yet. Denies any abdominal pain, nausea, vomiting, fever, or chills.  Objective:  Vital signs in last 24 hours: Vitals:   06/27/23 0019 06/27/23 0500 06/27/23 0549 06/27/23 0751  BP: (!) 105/52  122/78 125/71  Pulse: 67  (!) 57 80  Resp: 15  16 18   Temp: 98 F (36.7 C)   98.3 F (36.8 C)  TempSrc:      SpO2: 96%  93% 98%  Weight:  100.7 kg    Height:       Weight change:   Intake/Output Summary (Last 24 hours) at 06/27/2023 1133 Last data filed at 06/27/2023 0559 Gross per 24 hour  Intake 199.65 ml  Output 1200 ml  Net -1000.35 ml   Supplemental Oxygen: Room Air SpO2: 98   Assessment/Plan:  Principal Problem:   Acute cholecystitis Patient with intermittent epigastric pain associated with meals that radiates to the back. Endorses chills, nausea, and vomiting. Denies diarrhea. Limited improvement with tums and pepto bismol. Prior appendectomy, no recent abdominal surgeries. Very occasional alcohol use, no history of alcohol use disorder.    WBC 10.2 WNL, lipase 29. CT and RUQ US showing inflammation of the gallbladder wall as well as gallbladder thickening/edema/sludge and gallstones. On physical exam patient with epigastric tenderness and abdomen was noted to be slightly distended. General surgery was consulted in the ED.    Symptoms and clinical findings concerning for acute cholecystitis. Patient started on IV Zosyn and on heparin gtt. VS stable. HIDA scan ordered, pending. Surgery likely pending HIDA scan. Will defer to  surgery for recommendations.  Plan:  Patient remains afebrile with no leukocytosis. He has no complaints of abdominal pain or nausea today. - Surgery recommends HIDA scan and likely percutaneous drainage if positive due to cardiac history. ACS NSQIP surgical risk calculator was 5.1% for serious complications. Results documented in media. - NPO at midnight - Heparin gtt - IV Zosyn - trend temperature  - monitor daily labs    Afib, rate controlled S/p ICD system extraction 06/01/2023. On Eliquis 5 mg BID and Carvedilol/Coreg 3.125 mg BID. EKG on admission with Afib, HR 81 BPM, Qtc 485 ms, LBBB (stable from 06/14/23 EKG). Asymptomatic on exam. Last Eliquis dose yesterday, will hold and start IV heparin for possible surgical procedure. Plan:  - Continue home carvedilol 3.125 mg BID - Restart home Eliquis after surgical evaluation/surgery    History of CHF (LVEF 30-35%) CAD MI s/p stent to LAD 2005 HTN History of congestive heart failure, CAD, MI, and HTN. Home medications include carvedilol 3.125 mg BID, furosemide/lasix 40 mg daily, potassium chloride 10 mEq BID. Sleeps flat and uses only one pillow under his head.    Follows regularly with Dr. Elease Hashimoto, MD [facility]), last visit 06/15/23, 51-month follow up visit with Eligha Bridegroom, NP April 2025). BNP elevated at 455.8. Potassium 4.2 on admission. EKG Afib, stable LBBB. Troponins 26 > 22, peaked. CXR showing cardiac enlargement, negative for effusion or acute disease. Bilateral leg swelling and abdominal swelling as well as lung crackles on the left lung  base. Normal respiratory effort on room air.  Potassium 3.9 today. Magnesium 1.9 today. Plan:  - On Telemetry  - Resume home carvedilol 3.125 mg BID  - Will resume home Lasix - Strict I/Os - Daily weights  - Monitor daily BMP, replete K as needed to maintain > 4 - F/u magnesium, maintain > 2 and replete as needed -Continuing home Potassium Cl 10 mEq BID     Chronic Conditions:  HLD  Hepatic steatosis Chronic. Last lipid panel 05/26/2022: cholesterol 133, triglycerides 164, HDL 33, VLDL 28, will repeat. Previously on atorvastatin but stopped due to joint pain. RUQ showing hepatic steatosis, will trend LFTs on CMP.  - Resume home Rosuvastatin 1/2 10 mg tablet daily  - Follow up CMP CKD Chronic. Serum Cr 1.13 on admission. Will monitor.  Venous insufficiency Chronic. 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 Patient reports use of compression stockings at home. Recommend keeping skin moisturized and legs elevated.  Hypothyroidism Chronic. TSH 1.120 12/17/2022, will check TSH here.  - Continue home levothyroxine 125 mcg  Gout Chronic. No acute gout flare on admission, has been years since the last one.  - Continue home allopurinol 100 mg at bedtime    VTE prophylaxis: heparin gtt Diet: NPO Status: DNR  Scherrie November, MS4   LOS: 1 day   Scherrie November, Medical Student 06/27/2023, 11:33 AM

## 2023-06-27 NOTE — Plan of Care (Signed)

## 2023-06-27 NOTE — Progress Notes (Signed)
PHARMACY - ANTICOAGULATION CONSULT NOTE  Pharmacy Consult for heparin  Indication: atrial fibrillation  Allergies  Allergen Reactions   Lipitor [Atorvastatin] Other (See Comments)    MUSCLE ACHE    Jardiance [Empagliflozin] Other (See Comments)    Intolerance     Patient Measurements: Height: 5\' 10"  (177.8 cm) Weight: 100.7 kg (222 lb 0.1 oz) IBW/kg (Calculated) : 73 Heparin Dosing Weight: 94.1kg   Vital Signs: Temp: 98 F (36.7 C) (10/20 0019) Temp Source: Oral (10/19 1552) BP: 105/52 (10/20 0019) Pulse Rate: 67 (10/20 0019)  Labs: Recent Labs    06/26/23 0626 06/26/23 0857 06/26/23 2341 06/26/23 2342  HGB 11.7*  --   --   --   HCT 36.3*  --   --   --   PLT 278  --   --   --   APTT  --   --  52*  --   HEPARINUNFRC  --   --   --  >1.10*  CREATININE 1.13  --   --   --   TROPONINIHS 26* 22*  --   --     Estimated Creatinine Clearance: 58.9 mL/min (by C-G formula based on SCr of 1.13 mg/dL).   Medical History: Past Medical History:  Diagnosis Date   AICD (automatic cardioverter/defibrillator) present    Arthritis    knees, back    BPH (benign prostatic hypertrophy)    CHF (congestive heart failure) (HCC)    Chronic kidney disease    BPH   Chronic systolic heart failure (HCC)    a. s/p MDT single chamber ICD 2004 as part of MASTER study b. upgrade to CRTD 2010; CRTD gen change 2016   Coronary artery disease    a. s/p anterior MI and CYPHER stent to LAD 2005   Dyslipidemia    Dysrhythmia    a-fib   Gout    Ischemic cardiomyopathy    Paroxysmal atrial fibrillation (HCC)    Ventricular tachycardia (HCC)     Assessment: Patient admitted with epigastric pain, concern for cholecystitis. Hx of Afib on Eliquis, last dose 10/18 PM. HgB 11.7 and PLTs 278. Pharmacy consulted to dose heparin gtt. Will use aPTT for now in the setting of recent DOAC intake.   Initial heparin level >1.1 as expected, aPTT subtherapeutic at 52 seconds. No infusion issues or bleeding  concerns per RN.  Goal of Therapy:  Heparin level 0.3-0.7 units/ml aPTT 66-103 Monitor platelets by anticoagulation protocol: Yes   Plan:  Increase heparin 1550 units/h Repeat aPTT in 8h  Fredonia Highland, PharmD, [place], [facility] Clinical Pharmacist Please check AMION for all [facility] Pharmacy numbers 06/27/2023

## 2023-06-27 NOTE — Progress Notes (Signed)
HIDA pending, ok to have diet as tolerated until. Poor surgical candidate given significant cardiac history. Surgery team will reassess patient once HIDA resulted.   Juliet Rude, [facility] Surgery 06/27/2023, 11:08 AM Please see Amion for pager number during day hours 7:00am-4:30pm

## 2023-06-27 NOTE — Progress Notes (Signed)
PHARMACY - ANTICOAGULATION CONSULT NOTE  Pharmacy Consult for heparin  Indication: atrial fibrillation  Allergies  Allergen Reactions   Lipitor [Atorvastatin] Other (See Comments)    MUSCLE ACHE    Jardiance [Empagliflozin] Other (See Comments)    Intolerance     Patient Measurements: Height: 5\' 10"  (177.8 cm) Weight: 100.7 kg (222 lb 0.1 oz) IBW/kg (Calculated) : 73 Heparin Dosing Weight: 94.1kg   Vital Signs: Temp: 98.2 F (36.8 C) (10/20 2023) Temp Source: Oral (10/20 1418) BP: 106/54 (10/20 2023) Pulse Rate: 69 (10/20 2023)  Labs: Recent Labs    06/26/23 0626 06/26/23 0857 06/26/23 2341 06/26/23 2342 06/27/23 0919 06/27/23 1904  HGB 11.7*  --   --   --  10.3*  --   HCT 36.3*  --   --   --  32.4*  --   PLT 278  --   --   --  251  --   APTT  --   --  52*  --  40* 137*  HEPARINUNFRC  --   --   --  >1.10*  --   --   CREATININE 1.13  --   --   --  1.34*  --   TROPONINIHS 26* 22*  --   --   --   --     Estimated Creatinine Clearance: 49.7 mL/min (A) (by C-G formula based on SCr of 1.34 mg/dL (H)).   Medical History: Past Medical History:  Diagnosis Date   AICD (automatic cardioverter/defibrillator) present    Arthritis    knees, back    BPH (benign prostatic hypertrophy)    CHF (congestive heart failure) (HCC)    Chronic kidney disease    BPH   Chronic systolic heart failure (HCC)    a. s/p MDT single chamber ICD 2004 as part of MASTER study b. upgrade to CRTD 2010; CRTD gen change 2016   Coronary artery disease    a. s/p anterior MI and CYPHER stent to LAD 2005   Dyslipidemia    Dysrhythmia    a-fib   Gout    Ischemic cardiomyopathy    Paroxysmal atrial fibrillation (HCC)    Ventricular tachycardia (HCC)     Assessment: Patient admitted with epigastric pain, concern for cholecystitis. Hx of Afib on Eliquis, last dose 10/18 PM. HgB 11.7 and PLTs 278. Pharmacy consulted to dose heparin gtt. Will use aPTT for now in the setting of recent DOAC  intake.   aPTT up to 137 sec (supratherapeutic) on infusion at 1800 units/hr. Appears that dayshift never charted earlier rate increase to 1800 units/hr - on MAR looks like ir was running at 1550 units/hr but RN confirmed it is at 1800 units/hr. No bleeding noted.   Goal of Therapy:  Heparin level 0.3-0.7 units/ml aPTT 66-102 sec Monitor platelets by anticoagulation protocol: Yes   Plan:  Hold heparin x 1 hour Restart heparin at 1650 units/hr aPTT and heparin level 8 hours post restart  Christoper Fabian, PharmD, BCPS Please see amion for complete clinical pharmacist phone list 06/27/2023 10:01 PM

## 2023-06-28 ENCOUNTER — Inpatient Hospital Stay (HOSPITAL_COMMUNITY): Payer: Medicare PPO

## 2023-06-28 ENCOUNTER — Encounter: Payer: Self-pay | Admitting: Cardiovascular Disease

## 2023-06-28 DIAGNOSIS — K810 Acute cholecystitis: Secondary | ICD-10-CM | POA: Diagnosis not present

## 2023-06-28 LAB — CBC
HCT: 33.6 % — ABNORMAL LOW (ref 39.0–52.0)
Hemoglobin: 10.7 g/dL — ABNORMAL LOW (ref 13.0–17.0)
MCH: 30.5 pg (ref 26.0–34.0)
MCHC: 31.8 g/dL (ref 30.0–36.0)
MCV: 95.7 fL (ref 80.0–100.0)
Platelets: 255 10*3/uL (ref 150–400)
RBC: 3.51 MIL/uL — ABNORMAL LOW (ref 4.22–5.81)
RDW: 14 % (ref 11.5–15.5)
WBC: 6 10*3/uL (ref 4.0–10.5)
nRBC: 0 % (ref 0.0–0.2)

## 2023-06-28 LAB — BASIC METABOLIC PANEL
Anion gap: 9 (ref 5–15)
BUN: 16 mg/dL (ref 8–23)
CO2: 24 mmol/L (ref 22–32)
Calcium: 8.7 mg/dL — ABNORMAL LOW (ref 8.9–10.3)
Chloride: 104 mmol/L (ref 98–111)
Creatinine, Ser: 1.19 mg/dL (ref 0.61–1.24)
GFR, Estimated: >60 mL/min (ref >60)
Glucose, Bld: 109 mg/dL — ABNORMAL HIGH (ref 70–99)
Potassium: 4 mmol/L (ref 3.5–5.1)
Sodium: 137 mmol/L (ref 135–145)

## 2023-06-28 LAB — HEPARIN LEVEL (UNFRACTIONATED)
Heparin Unfractionated: >1.1 [IU]/mL — ABNORMAL HIGH (ref 0.30–0.70)
Heparin Unfractionated: 0.78 [IU]/mL — ABNORMAL HIGH (ref 0.30–0.70)

## 2023-06-28 LAB — APTT
aPTT: 152 s — ABNORMAL HIGH (ref 24–36)
aPTT: 78 s — ABNORMAL HIGH (ref 24–36)

## 2023-06-28 LAB — GLUCOSE, CAPILLARY
Glucose-Capillary: 108 mg/dL — ABNORMAL HIGH (ref 70–99)
Glucose-Capillary: 101 mg/dL — ABNORMAL HIGH (ref 70–99)
Glucose-Capillary: 129 mg/dL — ABNORMAL HIGH (ref 70–99)

## 2023-06-28 LAB — TSH: TSH: 0.327 u[IU]/mL — ABNORMAL LOW (ref 0.350–4.500)

## 2023-06-28 MED ORDER — ACETAMINOPHEN 10 MG/ML IV SOLN
1000.0000 mg | Freq: Once | INTRAVENOUS | Status: AC
  Administered 2023-06-28: 1000 mg via INTRAVENOUS
  Filled 2023-06-28: qty 100

## 2023-06-28 MED ORDER — TECHNETIUM TC 99M MEBROFENIN IV KIT
5.0000 | PACK | Freq: Once | INTRAVENOUS | Status: AC | PRN
Start: 2023-06-28 — End: 2023-06-28
  Administered 2023-06-28: 5 via INTRAVENOUS

## 2023-06-28 MED ORDER — HEPARIN (PORCINE) 25000 UT/250ML-% IV SOLN
1550.0000 [IU]/h | INTRAVENOUS | Status: DC
Start: 2023-06-28 — End: 2023-06-28
  Administered 2023-06-28: 1550 [IU]/h via INTRAVENOUS

## 2023-06-28 MED ORDER — SODIUM CHLORIDE 0.9 % IV SOLN
2.0000 g | Freq: Once | INTRAVENOUS | Status: AC
  Administered 2023-06-29: 2 g via INTRAVENOUS
  Filled 2023-06-28 (×2): qty 2

## 2023-06-28 MED ORDER — TECHNETIUM TC 99M MEBROFENIN IV KIT
5.0000 | PACK | Freq: Once | INTRAVENOUS | Status: DC | PRN
Start: 2023-06-28 — End: 2023-07-01

## 2023-06-28 MED ORDER — MORPHINE SULFATE (PF) 4 MG/ML IV SOLN
3.0000 mg | Freq: Once | INTRAVENOUS | Status: AC
  Administered 2023-06-28: 3 mg via INTRAVENOUS
  Filled 2023-06-28: qty 1

## 2023-06-28 MED ORDER — HEPARIN (PORCINE) 25000 UT/250ML-% IV SOLN: 1550.0000 [IU]/h | INTRAVENOUS | Status: DC

## 2023-06-28 NOTE — Consult Note (Signed)
Chief Complaint: Acute cholecystitis. Request is for cholecystomy tube placement  Referring Physician(s): Dr. Emelia Loron  Supervising Physician: Irish Lack  Patient Status: [facility] - In-pt  History of Present Illness: [name] is a 82 y.o. male inpatient. History of CAD, MI s/p PCI, a fib (on eliquis), DM, CKD, HTN, V fib s/p ICD with removal on  9.24.24 at OSH due to infection. Patient presented to the ED at [facility] on 10.19.24 with abdominal pain. Found to have acute cholecystitis. HIDA scan from 10.21.24 positive for acute cholecystitis. Per general surgery patient is not a candidate due to his cardiology status. Team is requesting a cholecystomy tube placement  Currently without any significant complaints. Patient alert and laying in bed,calm. Denies any fevers, headache, chest pain, SOB, cough, abdominal pain, nausea, vomiting or bleeding.  Education provided to the Patient and [name]  regarding cholecystomy tube. The daughter has some concerns regarding the patient's ability to care for a drain. She is requesting possible transfer to [facility] for further surgical drain. This was communicated to the hospitalist. IR will proceed as originally planned with cholecystomy tube placement unless we hear otherwise.  Return precautions and treatment recommendations and follow-up discussed with the patient and his daughter at bedside. Both who are agreeable with the plan.  Advance Care Plan: The advanced care plan/surrogate decision maker was discussed at the time of visit and documented in the medical record. Patient desires DNR   Past Medical History:  Diagnosis Date   AICD (automatic cardioverter/defibrillator) present    Arthritis    knees, back    BPH (benign prostatic hypertrophy)    CHF (congestive heart failure) (HCC)    Chronic kidney disease    BPH   Chronic systolic heart failure (HCC)    a. s/p MDT single chamber ICD 2004 as part of MASTER study b.  upgrade to CRTD 2010; CRTD gen change 2016   Coronary artery disease    a. s/p anterior MI and CYPHER stent to LAD 2005   Dyslipidemia    Dysrhythmia    a-fib   Gout    Ischemic cardiomyopathy    Paroxysmal atrial fibrillation (HCC)    Ventricular tachycardia (HCC)     Past Surgical History:  Procedure Laterality Date   ANAL RECTAL MANOMETRY N/A 06/23/2023   Procedure: ANO RECTAL MANOMETRY;  Surgeon: Napoleon Form, MD;  Location: [facility] ENDOSCOPY;  Service: Gastroenterology;  Laterality: N/A;   APPENDECTOMY  1970   BI-VENTRICULAR IMPLANTABLE CARDIOVERTER DEFIBRILLATOR UPGRADE N/A 11/07/2014   a. MDT single chamber ICD implanted 2004 as part of MASTER study; upgrade to CRTD 2010; gen change 2016   BIV ICD GENERATOR CHANGEOUT N/A 11/12/2021   Procedure: BIV ICD GENERATOR CHANGEOUT;  Surgeon: Duke Salvia, MD;  Location: [facility] INVASIVE CV LAB;  Service: Cardiovascular;  Laterality: N/A;   CARDIAC CATHETERIZATION  09/08/2003   x3 total of 5 stents   CARDIOVERSION N/A 07/04/2019   Procedure: CARDIOVERSION;  Surgeon: Wendall Stade, MD;  Location: [facility] ENDOSCOPY;  Service: Cardiovascular;  Laterality: N/A;   CARDIOVERSION N/A 08/17/2019   Procedure: CARDIOVERSION;  Surgeon: Vesta Mixer, MD;  Location: [facility] ENDOSCOPY;  Service: Cardiovascular;  Laterality: N/A;   CARDIOVERSION N/A 05/04/2022   Procedure: CARDIOVERSION;  Surgeon: Quintella Reichert, MD;  Location: [facility] ENDOSCOPY;  Service: Cardiovascular;  Laterality: N/A;   PROSTATECTOMY  11/06/2011   Procedure: PROSTATECTOMY SUPRAPUBIC;  Surgeon: Kathi Ludwig, MD;  Location: [facility] ORS;  Service: Urology;  Laterality:  N/A;  Open Suprapubic Prostatectomy   TEE WITHOUT CARDIOVERSION N/A 05/16/2019   Procedure: TRANSESOPHAGEAL ECHOCARDIOGRAM (TEE);  Surgeon: Sande Rives, MD;  Location: [facility] ENDOSCOPY;  Service: Cardiology;  Laterality: N/A;   TOTAL KNEE ARTHROPLASTY Left 10/06/2021   Procedure: TOTAL KNEE ARTHROPLASTY;  Surgeon:  Jodi Geralds, MD;  Location: [facility] ORS;  Service: Orthopedics;  Laterality: Left;    Allergies: Lipitor [atorvastatin] and Jardiance [empagliflozin]  Medications: Prior to Admission medications   Medication Sig Start Date End Date Taking? Authorizing Provider  acetaminophen (TYLENOL) 325 MG tablet Take 2 tablets (650 mg total) by mouth every 4 (four) hours as needed for headache or mild pain. 05/27/23  Yes Sheilah Pigeon, PA-C  allopurinol (ZYLOPRIM) 100 MG tablet Take 100 mg by mouth at bedtime. 05/14/15  Yes [provider]  apixaban (ELIQUIS) 5 MG TABS tablet Take 5 mg by mouth 2 (two) times daily.   Yes [provider]  carvedilol (COREG) 3.125 MG tablet TAKE 1 TABLET TWICE DAILY 06/15/22  Yes Nahser, Deloris Ping, MD  furosemide (LASIX) 40 MG tablet Take 1 tablet (40 mg total) by mouth daily. 09/29/22  Yes Duke Salvia, MD  latanoprost (XALATAN) 0.005 % ophthalmic solution Place 1 drop into both eyes at bedtime.  10/03/13  Yes [provider]  levothyroxine (SYNTHROID) 125 MCG tablet Take 125 mcg by mouth daily before breakfast.   Yes [provider]  nitroGLYCERIN (NITROSTAT) 0.4 MG SL tablet Place 1 tablet (0.4 mg total) under the tongue every 5 (five) minutes as needed for chest pain. 12/12/19  Yes Nahser, Deloris Ping, MD  Polyethyl Glycol-Propyl Glycol 0.4-0.3 % SOLN Place 1 drop into both eyes daily.   Yes [provider]  potassium chloride (KLOR-CON) 10 MEQ tablet Take 1 tablet (10 mEq total) by mouth daily. 09/29/22  Yes Duke Salvia, MD  rosuvastatin (CRESTOR) 10 MG tablet TAKE 1/2 TABLET EVERY DAY 03/24/23  Yes Nahser, Deloris Ping, MD  linezolid (ZYVOX) 600 MG tablet Take 600 mg by mouth 2 (two) times daily. Patient not taking: Reported on 06/27/2023 06/02/23   [provider]     Family History  Problem Relation Age of Onset   Heart disease Sister        3 63f the 4 had HD   Heart disease Brother        2 of 3 had HD   Heart disease  Sister        1 of the 6 has HD   Heart disease Brother     Social History   Socioeconomic History   Marital status: Married    Spouse name: Not on file   Number of children: Not on file   Years of education: Not on file   Highest education level: Not on file  Occupational History   Not on file  Tobacco Use   Smoking status: Never    Passive exposure: Never   Smokeless tobacco: Never  Vaping Use   Vaping status: Never Used  Substance and Sexual Activity   Alcohol use: No    Comment: occasional beer    Drug use: No   Sexual activity: Not on file  Other Topics Concern   Not on file  Social History Narrative   Not on file   Social Determinants of Health   Financial Resource Strain: Not on file  Food Insecurity: No Food Insecurity (06/26/2023)   Hunger Vital Sign    Worried About Running Out of Food  in the Last Year: Never true    Ran Out of Food in the Last Year: Never true  Transportation Needs: No Transportation Needs (06/26/2023)   PRAPARE - Administrator, Civil Service (Medical): No    Lack of Transportation (Non-Medical): No  Physical Activity: Not on file  Stress: Not on file  Social Connections: Not on file     Review of Systems: A 12 point ROS discussed and pertinent positives are indicated in the HPI above.  All other systems are negative.  Review of Systems  Vital Signs: BP 135/72 (BP Location: Right Arm)   Pulse 71   Temp 97.6 F (36.4 C) (Oral)   Resp 18   Ht 5\' 10"  (1.778 m)   Wt 222 lb 10.6 oz (101 kg)   SpO2 100%   BMI 31.95 kg/m   Advance Care Plan: The advanced care plan/surrogate decision maker was discussed at the time of visit and documented in the medical record. Daughter [name]    Physical Exam Vitals and nursing note reviewed.  Constitutional:      Appearance: He is well-developed.  HENT:     Head: Normocephalic.  Pulmonary:     Effort: Pulmonary effort is normal.  Musculoskeletal:        General: Normal  range of motion.     Cervical back: Normal range of motion.  Skin:    General: Skin is dry.  Neurological:     Mental Status: He is alert and oriented to person, place, and time.     Imaging: NM Hepatobiliary Liver Func  Result Date: 06/28/2023 CLINICAL DATA:  Cholecystitis.  Abdominal pain. EXAM: NUCLEAR MEDICINE HEPATOBILIARY IMAGING TECHNIQUE: Sequential images of the abdomen were obtained out to 60 minutes following intravenous administration of radiopharmaceutical. RADIOPHARMACEUTICALS:  5.0 mCi Tc-5m  Choletec IV COMPARISON:  CT and ultrasound 06/26/2023 FINDINGS: Prompt clearance radiotracer from blood pool and homogeneous uptake in liver. Counts are evident in the small bowel by 20 minutes. Gallbladder fails to fill at 60 minutes. 3 mg IV morphine was administered to augment filling of the gallbladder. Gallbladder fails to fill following morphine augmentation. IMPRESSION: Non filling of the gallbladder is most consistent ACUTE CHOLECYSTITIS. Patent common bile duct. These results will be called to the ordering clinician or representative by the Radiologist Assistant, and communication documented in the PACS or [employer]. Electronically Signed   By: Genevive Bi M.D.   On: 06/28/2023 14:38   US Abdomen Limited RUQ (LIVER/GB)  Result Date: 06/26/2023 CLINICAL DATA:  Abdominal pain EXAM: ULTRASOUND ABDOMEN LIMITED RIGHT UPPER QUADRANT COMPARISON:  None Available. FINDINGS: Gallbladder: Gallbladder wall thickening. Sludge versus small stone seen in the gallbladder neck. No sonographic Murphy sign noted by sonographer. Common bile duct: Diameter: 5.5 mm Liver: No focal lesion identified. Increased parenchymal echogenicity. Portal vein is patent on color Doppler imaging with normal direction of blood flow towards the liver. Other: Evaluation limited due to patient body habitus. IMPRESSION: 1. Gallbladder wall thickening with sludge versus small stone in the gallbladder neck, findings  are concerning for acute cholecystitis, although sonographic Murphy sign was negative. Recommend clinical correlation. 2. Hepatic steatosis. Electronically Signed   By: Allegra Lai M.D.   On: 06/26/2023 10:48   CT ABDOMEN PELVIS W CONTRAST  Result Date: 06/26/2023 CLINICAL DATA:  epigastric pain EXAM: CT ABDOMEN AND PELVIS WITH CONTRAST TECHNIQUE: Multidetector CT imaging of the abdomen and pelvis was performed using the standard protocol following bolus administration of intravenous contrast. RADIATION DOSE  REDUCTION: This exam was performed according to the departmental dose-optimization program which includes automated exposure control, adjustment of the mA and/or kV according to patient size and/or use of iterative reconstruction technique. CONTRAST:  75mL OMNIPAQUE IOHEXOL 350 MG/ML SOLN COMPARISON:  CT scan abdomen and pelvis from 01/03/2021. FINDINGS: Lower chest: There are subpleural atelectatic changes in the visualized lung bases. No overt consolidation. No pleural effusion. The heart is normal in size. No pericardial effusion. Note is made of bilateral mild-to-moderate symmetric gynecomastia. Hepatobiliary: The liver is normal in size. Non-cirrhotic configuration. No suspicious mass. These is mild diffuse hepatic steatosis. No intrahepatic or extrahepatic bile duct dilation. There is small volume dependent sludge/tiny calculi in the gallbladder. There is mild diffuse gallbladder wall edema. There is mild pericholecystic fat stranding. There is suspected short segment of cystic duct which exhibits hyperattenuating contents (series 6, image 117), concerning for sludge within the cystic duct. In appropriate clinical settings, these findings suggest early/subacute cholecystitis. Correlate clinically. Pancreas: Unremarkable. No pancreatic ductal dilatation or surrounding inflammatory changes. Spleen: Within normal limits. No focal lesion. Adrenals/Urinary Tract: Adrenal glands are unremarkable. No  suspicious renal mass. No hydronephrosis. No renal or ureteric calculi. Unremarkable urinary bladder. Stomach/Bowel: There is a small sliding hiatal hernia. There is a diverticulum arising from the second part of duodenum. No disproportionate dilation of the small or large bowel loops. No evidence of abnormal bowel wall thickening or inflammatory changes. The appendix is surgically absent. Vascular/Lymphatic: No ascites or pneumoperitoneum. No abdominal or pelvic lymphadenopathy, by size criteria. No aneurysmal dilation of the major abdominal arteries. There are mild peripheral atherosclerotic vascular calcifications of the aorta and its major branches. Reproductive: Normal-sized prostate gland exhibiting post TURP defect. Symmetric bilateral seminal vesicles. Other: There is a tiny fat containing umbilical hernia. Tiny right and small left fat containing inguinal hernia noted. Musculoskeletal: No suspicious osseous lesions. There are mild multilevel degenerative changes in the visualized spine. IMPRESSION: 1. Cholelithiasis with mild diffuse gallbladder wall edema and mild pericholecystic fat stranding. There is suspected short segment of cystic duct which exhibits hyperattenuating contents, concerning for sludge within the cystic duct. In appropriate clinical settings, these findings suggest early/subacute cholecystitis. 2. Multiple other nonacute observations, as described above. Aortic Atherosclerosis (ICD10-I70.0). Electronically Signed   By: Jules Schick M.D.   On: 06/26/2023 09:01   DG Chest 2 View  Result Date: 06/26/2023 CLINICAL DATA:  Chest pain EXAM: CHEST - 2 VIEW COMPARISON:  05/26/2023 FINDINGS: Cardiac enlargement. Mild fissure thickening but no Kerley lines or cephalized blood flow. No effusion, pneumothorax, or consolidation. Coronary stenting. Interval defibrillator explant. IMPRESSION: No evidence of acute disease. Electronically Signed   By: Tiburcio Pea M.D.   On: 06/26/2023 06:44     Labs:  CBC: Recent Labs    06/14/23 1043 06/26/23 0626 06/27/23 0919 06/28/23 0828  WBC 9.9 10.2 6.2 6.0  HGB 12.5* 11.7* 10.3* 10.7*  HCT 38.9 36.3* 32.4* 33.6*  PLT 224 278 251 255    COAGS: Recent Labs    06/26/23 2341 06/27/23 0919 06/27/23 1904 06/28/23 0828  APTT 52* 40* 137* 152*    BMP: Recent Labs    05/27/23 0526 06/26/23 0626 06/27/23 0919 06/28/23 0828  NA 138 138 135 137  K 4.0 4.2 3.9 4.0  CL 105 102 101 104  CO2 24 25 25 24   GLUCOSE 108* 118* 235* 109*  BUN 17 17 16 16   CALCIUM 8.3* 9.3 8.6* 8.7*  CREATININE 1.12 1.13 1.34* 1.19  GFRNONAA >60 >60  53* >60    LIVER FUNCTION TESTS: Recent Labs    06/26/23 0857 06/27/23 0919  BILITOT 0.9 1.1  AST 23 22  ALT 18 16  ALKPHOS 76 87  PROT 6.0* 6.0*  ALBUMIN 3.0* 2.9*      Assessment and Plan:  82 y.o male inpatient. History of CAD, MI s/p PCI, a fib (on eliquis), DM, CKD, HTN, V fib s/p ICD with removal on  9.24.24 at OSH due to infection. Patient presented to the ED at [facility] on 10.19.24 with abdominal pain. Found to have acute cholecystitis. HIDA scan from 10.21.24 positive for acute cholecystitis. Per general surgery patient is not a candidate due to his cardiology status. Team is requesting a cholecystomy tube placement  Patient on eliquis prior to admission. Currently on heparin gtt. No leukocytosis. Patient is afebrile. All other labs and medications are within acceptable parameters. No pertinent allergies.   IR consulted for possible cholecystomy tube. Case has been reviewed and procedure approved by Dr. Irish Lack.  Patient tentatively scheduled for 10.22. 24  Team instructed to: Keep Patient to be NPO after midnight Hold heparin gtt 4 hours post procedure. Note sent to pharmacy to hold at 4am.  IR will call patient when ready.  Daughter [name] at bedside expressing concern regarding the procedure and the patient's ability to care for himself with a drain in place. The Daughter  is requesting that the Patient be transferred to [facility] for possible second surgical evaluation. Both the Patient and the daughter agree to move forward with the IR procedure tentatively scheduled for 10.22.24 as planned but would like to entertain the transfer in the interim. This was communicated directly to the bedside RN and sent via EPIC chat to the hospitalist.   Risks and benefits discussed with the patient including, but not limited to bleeding, infection, gallbladder perforation, bile leak, sepsis or even death.  All of the patient's questions were answered, patient is agreeable to proceed.  Consent signed and in chart.   Thank you for this interesting consult.  I greatly enjoyed meeting Atul L Lundahl and look forward to participating in their care.  A copy of this report was sent to the requesting provider on this date.  Electronically Signed: Alene Mires, NP 06/28/2023, 3:36 PM   I spent a total of 40 Minutes    in face to face in clinical consultation, greater than 50% of which was counseling/coordinating care for cholecystomy tube placement

## 2023-06-28 NOTE — Progress Notes (Signed)
PHARMACY - ANTICOAGULATION CONSULT NOTE  Pharmacy Consult for continuous infusion of unfractionated heparin.  Indication: atrial fibrillation  Allergies  Allergen Reactions   Lipitor [Atorvastatin] Other (See Comments)    MUSCLE ACHE    Jardiance [Empagliflozin] Other (See Comments)    Intolerance     Patient Measurements: Height: 5\' 10"  (177.8 cm) Weight: 101 kg (222 lb 10.6 oz) IBW/kg (Calculated) : 73 Heparin Dosing Weight: 94.1kg  Vital Signs: Temp: 97.6 F (36.4 C) (10/21 0752) Temp Source: Oral (10/21 0752) BP: 135/72 (10/21 0752) Pulse Rate: 71 (10/21 0752)  Labs: Recent Labs    06/26/23 0626 06/26/23 0857 06/26/23 2341 06/26/23 2342 06/27/23 0919 06/27/23 1904 06/28/23 0828  HGB 11.7*  --   --   --  10.3*  --  10.7*  HCT 36.3*  --   --   --  32.4*  --  33.6*  PLT 278  --   --   --  251  --  255  APTT  --   --    < >  --  40* 137* 152*  HEPARINUNFRC  --   --   --  >1.10*  --   --  >1.10*  CREATININE 1.13  --   --   --  1.34*  --  1.19  TROPONINIHS 26* 22*  --   --   --   --   --    < > = values in this interval not displayed.    Estimated Creatinine Clearance: 56 mL/min (by C-G formula based on SCr of 1.19 mg/dL).   Medical History: Past Medical History:  Diagnosis Date   AICD (automatic cardioverter/defibrillator) present    Arthritis    knees, back    BPH (benign prostatic hypertrophy)    CHF (congestive heart failure) (HCC)    Chronic kidney disease    BPH   Chronic systolic heart failure (HCC)    a. s/p MDT single chamber ICD 2004 as part of MASTER study b. upgrade to CRTD 2010; CRTD gen change 2016   Coronary artery disease    a. s/p anterior MI and CYPHER stent to LAD 2005   Dyslipidemia    Dysrhythmia    a-fib   Gout    Ischemic cardiomyopathy    Paroxysmal atrial fibrillation (HCC)    Ventricular tachycardia (HCC)     Assessment: Patient admitted with epigastric pain, concern for cholecystitis. Hx of Afib on Eliquis, last dose  10/18 PM. HgB 11.7 and PLTs 278. Pharmacy consulted to dose heparin gtt. Will use aPTT for now in the setting of recent DOAC intake.   aPTT value returned at 0828h today, 152 sec (supratherapeutic) on infusion rate of 1650 units/hr based upon last pharmacy note.  Goal of Therapy:  Heparin level 0.3-0.7 units/ml aPTT 66 - 102 seconds Monitor platelets by anticoagulation protocol: Yes   Plan:  HOLD heparin for 1 hour (Re-commence heparin drip at 1550 units/hr. Will seek clarity on any planned surgery pending based upon HIDA. If no planned surgery in the offing, will recommend switching from CIUHF to LMWH or a DOAC.   Elicia Lamp, PharmD, CPP 06/28/2023,9:42 AM

## 2023-06-28 NOTE — Plan of Care (Signed)
  Problem: Education: Goal: Knowledge of General Education information will improve Description Including pain rating scale, medication(s)/side effects and non-pharmacologic comfort measures Outcome: Progressing   

## 2023-06-28 NOTE — Plan of Care (Signed)
  Problem: Education: Goal: Knowledge of General Education information will improve Description: Including pain rating scale, medication(s)/side effects and non-pharmacologic comfort measures Outcome: Progressing   Problem: Health Behavior/Discharge Planning: Goal: Ability to manage health-related needs will improve Outcome: Progressing   Problem: Clinical Measurements: Goal: Ability to maintain clinical measurements within normal limits will improve Outcome: Progressing Goal: Will remain free from infection Outcome: Progressing Goal: Diagnostic test results will improve Outcome: Progressing Goal: Respiratory complications will improve Outcome: Progressing Goal: Cardiovascular complication will be avoided Outcome: Progressing   Problem: Activity: Goal: Risk for activity intolerance will decrease Outcome: Progressing   Problem: Nutrition: Goal: Adequate nutrition will be maintained Outcome: Progressing   Problem: Coping: Goal: Level of anxiety will decrease Outcome: Progressing   Problem: Elimination: Goal: Will not experience complications related to bowel motility Outcome: Progressing Goal: Will not experience complications related to urinary retention Outcome: Progressing   Problem: Pain Managment: Goal: General experience of comfort will improve Outcome: Progressing   Problem: Safety: Goal: Ability to remain free from injury will improve Outcome: Progressing   Problem: Skin Integrity: Goal: Risk for impaired skin integrity will decrease Outcome: Progressing   Problem: Education: Goal: Knowledge of General Education information will improve Description: Including pain rating scale, medication(s)/side effects and non-pharmacologic comfort measures Outcome: Progressing   Problem: Health Behavior/Discharge Planning: Goal: Ability to manage health-related needs will improve Outcome: Progressing   Problem: Clinical Measurements: Goal: Ability to maintain  clinical measurements within normal limits will improve Outcome: Progressing Goal: Will remain free from infection Outcome: Progressing Goal: Diagnostic test results will improve Outcome: Progressing Goal: Respiratory complications will improve Outcome: Progressing Goal: Cardiovascular complication will be avoided Outcome: Progressing   Problem: Activity: Goal: Risk for activity intolerance will decrease Outcome: Progressing   Problem: Nutrition: Goal: Adequate nutrition will be maintained Outcome: Progressing   Problem: Coping: Goal: Level of anxiety will decrease Outcome: Progressing   Problem: Elimination: Goal: Will not experience complications related to bowel motility Outcome: Progressing Goal: Will not experience complications related to urinary retention Outcome: Progressing   Problem: Pain Managment: Goal: General experience of comfort will improve Outcome: Progressing   Problem: Safety: Goal: Ability to remain free from injury will improve Outcome: Progressing   Problem: Skin Integrity: Goal: Risk for impaired skin integrity will decrease Outcome: Progressing   Problem: Education: Goal: Ability to describe self-care measures that may prevent or decrease complications (Diabetes Survival Skills Education) will improve Outcome: Progressing Goal: Individualized Educational Video(s) Outcome: Progressing   Problem: Coping: Goal: Ability to adjust to condition or change in health will improve Outcome: Progressing   Problem: Fluid Volume: Goal: Ability to maintain a balanced intake and output will improve Outcome: Progressing   Problem: Health Behavior/Discharge Planning: Goal: Ability to identify and utilize available resources and services will improve Outcome: Progressing Goal: Ability to manage health-related needs will improve Outcome: Progressing   Problem: Metabolic: Goal: Ability to maintain appropriate glucose levels will improve Outcome:  Progressing   Problem: Nutritional: Goal: Maintenance of adequate nutrition will improve Outcome: Progressing Goal: Progress toward achieving an optimal weight will improve Outcome: Progressing   Problem: Skin Integrity: Goal: Risk for impaired skin integrity will decrease Outcome: Progressing   Problem: Tissue Perfusion: Goal: Adequacy of tissue perfusion will improve Outcome: Progressing   

## 2023-06-28 NOTE — Progress Notes (Addendum)
Subjective:  Patient seen and examined at bedside today. Overnight, patient received some IV Tylenol for pain management. We discussed how we do not want to use any opioids for pain management as it may delay gastric emptying for the HIDA scan. Patient was frustrated with not being able to undergo the HIDA scan over the weekend. He understands the plan going forward. Patient endorses some RUQ abd pain and dry mouth. Unfortunately, he must remain NPO until the HIDA scan.  Objective:  Vital signs in last 24 hours: Vitals:   06/27/23 1418 06/27/23 2023 06/28/23 0436 06/28/23 0441  BP: 113/64 (!) 106/54 119/74   Pulse: 68 69 92   Resp: 18 17    Temp: 98.2 F (36.8 C) 98.2 F (36.8 C)    TempSrc: Oral     SpO2: 99% 96% 91%   Weight:    101 kg  Height:       Weight change: 0.3 kg  Intake/Output Summary (Last 24 hours) at 06/28/2023 0641 Last data filed at 06/28/2023 6644 Gross per 24 hour  Intake 654.53 ml  Output 600 ml  Net 54.53 ml   Supplemental Oxygen: Room Air SpO2: 91  Physical Exam Constitutional:      General: He is not in acute distress.    Appearance: He is obese. He is not ill-appearing.  HENT:     Head: Normocephalic and atraumatic.  Cardiovascular:     Rate and Rhythm: Normal rate. Rhythm irregular.     Pulses: Normal pulses.  Pulmonary:     Effort: Pulmonary effort is normal.     Breath sounds: Normal breath sounds. No wheezing.  Abdominal:     General: Bowel sounds are normal.     Tenderness: There is abdominal tenderness. There is no guarding or rebound.     Comments: Mild tenderness in the RUQ  Skin:    General: Skin is warm.  Neurological:     Mental Status: He is alert and oriented to person, place, and time.      Assessment/Plan:  Principal Problem:   Acute cholecystitis Patient with intermittent epigastric pain associated with meals that radiates to the back. Endorses chills, nausea, and vomiting. Denies diarrhea. Limited improvement with  tums and pepto bismol. Prior appendectomy, no recent abdominal surgeries. Very occasional alcohol use, no history of alcohol use disorder.    WBC 10.2 WNL, lipase 29. CT and RUQ US showing inflammation of the gallbladder wall as well as gallbladder thickening/edema/sludge and gallstones. On physical exam patient with epigastric tenderness and abdomen was noted to be slightly distended. General surgery was consulted in the ED.    Plan:  Patient received Tylenol overnight for his pain. He remains afebrile with no leukocytosis. WBC is 6 today. - Surgery recommends HIDA scan and likely percutaneous drainage if positive due to cardiac history. ACS NSQIP surgical risk calculator was 5.1% for serious complications. Results documented in media. - NPO at midnight. HIDA scan scheduled for noon. 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 On Eliquis 5 mg BID and Carvedilol/Coreg 3.125 mg BID. EKG on admission with Afib, HR 81 BPM, Qtc 485 ms, LBBB (stable from 06/14/23 EKG). Asymptomatic on exam. Last Eliquis dose yesterday, will hold and start IV heparin for possible surgical procedure. 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 Home medications include carvedilol 3.125 mg BID, furosemide/lasix 40 mg daily, potassium chloride 10 mEq BID. Sleeps flat and uses only one pillow under his head.    Follows regularly with Dr. Elease Hashimoto, MD [facility]), last visit 06/15/23, 38-month follow up visit with Eligha Bridegroom, NP April 2025). BNP elevated at 455.8. Potassium 4.2 on admission. EKG Afib, stable LBBB. Troponins 26 > 22, peaked. CXR showing cardiac enlargement, negative for effusion or acute  disease. Normal respiratory effort on room air and no crackles hear on exam today.  Potassium 4.0 today. Plan:  - On Telemetry  - Resume home carvedilol 3.125 mg BID  - Will hold home Lasix 40 due to patient being NPO and improvement in lung sounds on exam today. Will continue to monitor. - Strict I/Os - Daily weights  - Monitor daily BMP, replete K as needed to maintain > 4 - F/u magnesium, maintain > 2 and replete as needed -Continuing home Potassium Cl 10 mEq BID   Chronic Conditions:  HLD  Hepatic steatosis Chronic. Last lipid panel 05/26/2022: cholesterol 133, triglycerides 164, HDL 33, VLDL 28, will repeat. Previously on atorvastatin but stopped due to joint pain. RUQ showing hepatic steatosis, will trend LFTs on CMP.  - Resume home Rosuvastatin 1/2 10 mg tablet daily  - Follow up CMP CKD Chronic. Serum Cr 1.13 on admission. Cr. 1.19 today. Will continue to monitor.  Venous insufficiency Chronic. 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 Patient reports use of compression stockings at home. Recommend keeping skin moisturized and legs elevated.  Hypothyroidism Chronic. TSH 1.120 12/17/2022, will check TSH here.  - Continue home levothyroxine 125 mcg  Gout Chronic. No acute gout flare on admission, has been years since the last one.  - Continue home allopurinol 100 mg at bedtime    VTE prophylaxis: heparin gtt Diet: NPO Status: DNR   Scherrie November, MS4   LOS: 2 days   Scherrie November, Medical Student 06/28/2023, 6:41 AM

## 2023-06-28 NOTE — Progress Notes (Signed)
   Subjective/Chief Complaint: No complaints right now   Objective: Vital signs in last 24 hours: Temp:  [97.6 F (36.4 C)-98.2 F (36.8 C)] 97.6 F (36.4 C) (10/21 0752) Pulse Rate:  [69-92] 71 (10/21 0752) Resp:  [17-18] 18 (10/21 0752) BP: (106-135)/(54-74) 135/72 (10/21 0752) SpO2:  [91 %-100 %] 100 % (10/21 0752) Weight:  [101 kg] 101 kg (10/21 0441) Last BM Date : 06/27/23  Intake/Output from previous day: 10/20 0701 - 10/21 0700 In: 654.5 [P.O.:477; I.V.:76.5; IV Piggyback:101.1] Out: 600 [Urine:600] Intake/Output this shift: No intake/output data recorded.  Ab soft minimally tender ruq   Lab Results:  Recent Labs    06/27/23 0919 06/28/23 0828  WBC 6.2 6.0  HGB 10.3* 10.7*  HCT 32.4* 33.6*  PLT 251 255   BMET Recent Labs    06/27/23 0919 06/28/23 0828  NA 135 137  K 3.9 4.0  CL 101 104  CO2 25 24  GLUCOSE 235* 109*  BUN 16 16  CREATININE 1.34* 1.19  CALCIUM 8.6* 8.7*   PT/INR No results for input(s): "LABPROT", "INR" in the last 72 hours. ABG No results for input(s): "PHART", "HCO3" in the last 72 hours.  Invalid input(s): "PCO2", "PO2"  Studies/Results: NM Hepatobiliary Liver Func  Result Date: 06/28/2023 CLINICAL DATA:  Cholecystitis.  Abdominal pain. EXAM: NUCLEAR MEDICINE HEPATOBILIARY IMAGING TECHNIQUE: Sequential images of the abdomen were obtained out to 60 minutes following intravenous administration of radiopharmaceutical. RADIOPHARMACEUTICALS:  5.0 mCi Tc-75m  Choletec IV COMPARISON:  CT and ultrasound 06/26/2023 FINDINGS: Prompt clearance radiotracer from blood pool and homogeneous uptake in liver. Counts are evident in the small bowel by 20 minutes. Gallbladder fails to fill at 60 minutes. 3 mg IV morphine was administered to augment filling of the gallbladder. Gallbladder fails to fill following morphine augmentation. IMPRESSION: Non filling of the gallbladder is most consistent ACUTE CHOLECYSTITIS. Patent common bile duct. These  results will be called to the ordering clinician or representative by the Radiologist Assistant, and communication documented in the PACS or [employer]. Electronically Signed   By: Genevive Bi M.D.   On: 06/28/2023 14:38    Anti-infectives: Anti-infectives (From admission, onward)    Start     Dose/Rate Route Frequency Ordered Stop   06/26/23 2100  piperacillin-tazobactam (ZOSYN) IVPB 3.375 g        3.375 g 12.5 mL/hr over 240 Minutes Intravenous Every 8 hours 06/26/23 1810     06/26/23 1245  piperacillin-tazobactam (ZOSYN) IVPB 3.375 g        3.375 g 100 mL/hr over 30 Minutes Intravenous  Once 06/26/23 1243 06/26/23 1421       Assessment/Plan: Cholecystitis -hida positive -with cards history and symptom duration I think perc chole best option, discussed with patient -recommend cardiology evaluation in case surgery does become option   [name] 06/28/2023

## 2023-06-29 ENCOUNTER — Telehealth: Payer: Self-pay | Admitting: Cardiovascular Disease

## 2023-06-29 ENCOUNTER — Inpatient Hospital Stay (HOSPITAL_COMMUNITY): Payer: Medicare PPO

## 2023-06-29 DIAGNOSIS — Z01810 Encounter for preprocedural cardiovascular examination: Secondary | ICD-10-CM | POA: Diagnosis not present

## 2023-06-29 DIAGNOSIS — I4819 Other persistent atrial fibrillation: Secondary | ICD-10-CM

## 2023-06-29 DIAGNOSIS — I4891 Unspecified atrial fibrillation: Secondary | ICD-10-CM

## 2023-06-29 DIAGNOSIS — I5042 Chronic combined systolic (congestive) and diastolic (congestive) heart failure: Secondary | ICD-10-CM | POA: Diagnosis not present

## 2023-06-29 DIAGNOSIS — K810 Acute cholecystitis: Secondary | ICD-10-CM | POA: Diagnosis not present

## 2023-06-29 DIAGNOSIS — I25118 Atherosclerotic heart disease of native coronary artery with other forms of angina pectoris: Secondary | ICD-10-CM

## 2023-06-29 HISTORY — PX: IR PERC CHOLECYSTOSTOMY: IMG2326

## 2023-06-29 LAB — PROTIME-INR
INR: 1.2 (ref 0.8–1.2)
Prothrombin Time: 15.8 s — ABNORMAL HIGH (ref 11.4–15.2)

## 2023-06-29 LAB — GLUCOSE, CAPILLARY
Glucose-Capillary: 88 mg/dL (ref 70–99)
Glucose-Capillary: 100 mg/dL — ABNORMAL HIGH (ref 70–99)
Glucose-Capillary: 113 mg/dL — ABNORMAL HIGH (ref 70–99)
Glucose-Capillary: 201 mg/dL — ABNORMAL HIGH (ref 70–99)

## 2023-06-29 MED ORDER — LIDOCAINE-EPINEPHRINE 1 %-1:100000 IJ SOLN
20.0000 mL | Freq: Once | INTRAMUSCULAR | Status: AC
  Administered 2023-06-29: 5 mL via INTRADERMAL
  Filled 2023-06-29: qty 20

## 2023-06-29 MED ORDER — SODIUM CHLORIDE 0.9% FLUSH
5.0000 mL | Freq: Three times a day (TID) | INTRAVENOUS | Status: DC
  Administered 2023-06-29 – 2023-07-01 (×6): 5 mL

## 2023-06-29 MED ORDER — MIDAZOLAM HCL 2 MG/2ML IJ SOLN
INTRAMUSCULAR | Status: AC | PRN
  Administered 2023-06-29: 1 mg via INTRAVENOUS

## 2023-06-29 MED ORDER — APIXABAN 5 MG PO TABS
5.0000 mg | ORAL_TABLET | Freq: Two times a day (BID) | ORAL | Status: DC
  Administered 2023-06-29 – 2023-07-01 (×5): 5 mg via ORAL
  Filled 2023-06-29 (×5): qty 1

## 2023-06-29 MED ORDER — FENTANYL CITRATE (PF) 100 MCG/2ML IJ SOLN
INTRAMUSCULAR | Status: AC | PRN
  Administered 2023-06-29: 50 ug via INTRAVENOUS

## 2023-06-29 MED ORDER — HYDROMORPHONE HCL 2 MG PO TABS
1.0000 mg | ORAL_TABLET | Freq: Three times a day (TID) | ORAL | Status: DC | PRN
  Administered 2023-06-30: 1 mg via ORAL
  Filled 2023-06-29: qty 1

## 2023-06-29 MED ORDER — ACETAMINOPHEN 500 MG PO TABS
1000.0000 mg | ORAL_TABLET | Freq: Three times a day (TID) | ORAL | Status: DC
  Administered 2023-06-29 – 2023-07-01 (×6): 1000 mg via ORAL
  Filled 2023-06-29 (×6): qty 2

## 2023-06-29 MED ORDER — HYDROMORPHONE HCL 1 MG/ML PO LIQD: 0.5000 mg | Freq: Three times a day (TID) | ORAL | Status: DC | PRN

## 2023-06-29 MED ORDER — IOHEXOL 300 MG/ML  SOLN
50.0000 mL | Freq: Once | INTRAMUSCULAR | Status: AC | PRN
  Administered 2023-06-29: 10 mL

## 2023-06-29 MED ORDER — FENTANYL CITRATE (PF) 100 MCG/2ML IJ SOLN
INTRAMUSCULAR | Status: AC
  Filled 2023-06-29: qty 2

## 2023-06-29 MED ORDER — LIDOCAINE-EPINEPHRINE 1 %-1:100000 IJ SOLN
INTRAMUSCULAR | Status: AC
Start: 2023-06-29 — End: ?
  Filled 2023-06-29: qty 1

## 2023-06-29 MED ORDER — MIDAZOLAM HCL 2 MG/2ML IJ SOLN
INTRAMUSCULAR | Status: AC
  Filled 2023-06-29: qty 2

## 2023-06-29 MED ORDER — HEPARIN (PORCINE) 25000 UT/250ML-% IV SOLN: 1550.0000 [IU]/h | INTRAVENOUS | Status: DC

## 2023-06-29 MED ORDER — HYDROMORPHONE HCL 1 MG/ML PO LIQD
1.0000 mg | Freq: Three times a day (TID) | ORAL | Status: DC | PRN
Start: 2023-06-29 — End: 2023-06-29

## 2023-06-29 MED ORDER — HEPARIN BOLUS VIA INFUSION
4700.0000 [IU] | Freq: Once | INTRAVENOUS | Status: DC
  Filled 2023-06-29: qty 4700

## 2023-06-29 NOTE — Progress Notes (Signed)
Subjective/Chief Complaint: Still with some pain, daughter in room   Objective: Vital signs in last 24 hours: Temp:  [97.6 F (36.4 C)-97.7 F (36.5 C)] 97.6 F (36.4 C) (10/22 0726) Pulse Rate:  [69-71] 69 (10/22 0726) Resp:  [18] 18 (10/22 0726) BP: (105-126)/(61-76) 121/61 (10/22 0726) SpO2:  [96 %-97 %] 97 % (10/22 0726) Weight:  [103 kg] 103 kg (10/22 0500) Last BM Date : 06/27/23  Intake/Output from previous day: 10/21 0701 - 10/22 0700 In: 241.3 [I.V.:141.3; IV Piggyback:100] Out: -  Intake/Output this shift: No intake/output data recorded.  Ab soft mild tender ruq   Lab Results:  Recent Labs    06/27/23 0919 06/28/23 0828  WBC 6.2 6.0  HGB 10.3* 10.7*  HCT 32.4* 33.6*  PLT 251 255   BMET Recent Labs    06/27/23 0919 06/28/23 0828  NA 135 137  K 3.9 4.0  CL 101 104  CO2 25 24  GLUCOSE 235* 109*  BUN 16 16  CREATININE 1.34* 1.19  CALCIUM 8.6* 8.7*   PT/INR Recent Labs    06/28/23 2349  LABPROT 15.8*  INR 1.2   ABG No results for input(s): "PHART", "HCO3" in the last 72 hours.  Invalid input(s): "PCO2", "PO2"  Studies/Results: NM Hepatobiliary Liver Func  Result Date: 06/28/2023 CLINICAL DATA:  Cholecystitis.  Abdominal pain. EXAM: NUCLEAR MEDICINE HEPATOBILIARY IMAGING TECHNIQUE: Sequential images of the abdomen were obtained out to 60 minutes following intravenous administration of radiopharmaceutical. RADIOPHARMACEUTICALS:  5.0 mCi Tc-46m  Choletec IV COMPARISON:  CT and ultrasound 06/26/2023 FINDINGS: Prompt clearance radiotracer from blood pool and homogeneous uptake in liver. Counts are evident in the small bowel by 20 minutes. Gallbladder fails to fill at 60 minutes. 3 mg IV morphine was administered to augment filling of the gallbladder. Gallbladder fails to fill following morphine augmentation. IMPRESSION: Non filling of the gallbladder is most consistent ACUTE CHOLECYSTITIS. Patent common bile duct. These results will be called  to the ordering clinician or representative by the Radiologist Assistant, and communication documented in the PACS or [employer]. Electronically Signed   By: Genevive Bi M.D.   On: 06/28/2023 14:38    Anti-infectives: Anti-infectives (From admission, onward)    Start     Dose/Rate Route Frequency Ordered Stop   06/28/23 1830  cefOXitin (MEFOXIN) 2 g in sodium chloride 0.9 % 100 mL IVPB        2 g 200 mL/hr over 30 Minutes Intravenous  Once 06/28/23 1735 06/29/23 0130   06/26/23 2100  piperacillin-tazobactam (ZOSYN) IVPB 3.375 g        3.375 g 12.5 mL/hr over 240 Minutes Intravenous Every 8 hours 06/26/23 1810     06/26/23 1245  piperacillin-tazobactam (ZOSYN) IVPB 3.375 g        3.375 g 100 mL/hr over 30 Minutes Intravenous  Once 06/26/23 1243 06/26/23 1421       Assessment/Plan: Cholecystitis -hida positive -with cards history and symptom duration I think perc chole best option, discussed with patient. I rediscussed this today with his daughter and him. His symptoms have actually been longer than six days. I absolutely think perc chole is best solution right now and consider surgery later especially after cards periop risk evaluation so we can weigh what best options are -there are even some IR and GI options if surgery ends up being prohibitive that we could discuss at later date -they are agreeable to proceed with drain today -he can have diet advanced once drain in from  my standpoint  Emelia Loron 06/29/2023

## 2023-06-29 NOTE — Consult Note (Addendum)
Cardiology Consultation   Patient ID: [name] MRN: [id number]; DOB: [birth year]  Admit date: 06/26/2023 Date of Consult: 06/29/2023  PCP:  Kaleen Mask, MD   [name], MD  Electrophysiologist:  Sherryl Manges, MD   Patient Profile:   [name] is a 82 y.o. male with a hx of CHF/chronic systolic heart failure, ischemic cardiomyopathy, CAD, HLD history of AICD s/p recent extraction, persistent atrial fibrillation who is being seen 06/29/2023 for the evaluation of pre-operative evaluation at the request of Dr. Ninetta Lights.  History of Present Illness:   [name] is an 82 year old male with above medical history who is followed by Dr. Elease Hashimoto, Dr. Graciela Husbands.  Per chart review, patient has a rather complex and long cardiac history. 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 Had a MDT single chamber ICD placed in 2004, and later had upgrade to CRTD in 2010. Had cardiac catheterization in 2005 and underwent restenting of his LAD at that time. He also has a history of persistent atrial fibrillation, initially was on coumadin, but now on eliquis. Had his ICD generator changed out in 2016. Echocardiogram in 2017 showed EF 35-40%, with regional wall motion abnormalities.   In 05/2019, patient underwent echocardiogram that showed a mass (1.6x2.6 cm) on the posterior aspect of the right atrium. Concerning for vegetation on pacer lead. EF 25-30% with extensive akinesis of the ateroseptum into the apex with myocardial thinning consistent with prior LAD infarction, normal RV function. Underwent TEE in 05/2019 that showed no evidence of vegetation on the pacer leads, no evidence of RA mass. Attempted cardioversion in 06/2019 that was initially successful, but he quickly went back into atrial fibrillation. He was started on amiodarone and again underwent cardioversion in 08/2019.   In 12/2019, patient developed  chest pain and underwent nuclear stress test that was showed findings consistent with prior myocardial infarction, no ischemia, EF 23%. Underwent ICD Gen change in 11/2021. IN 04/2022, patient went back into atrial fibrillation. Was symptomatic, so underwent DCCV on 05/04/22. Complained of chest pain in 07/2022 and underwent cardiac PET 07/21/22 that was a high risk study due to the presence of large anterior and septal infracts consistent with prior LAD MI. There was also severely reduced EF that dropped with stress (EF 15% at rest, 10% with stress). No ischemia noted.   Patient was admitted on 05/26/23 for evaluation of increased swelling and discomfort at his ICD site. He was seen by both Dr. Graciela Husbands and Dr. Ladona Ridgel, both felt that it was likely that he had an infection. Patient went on to have increased pain/discomfort at the ICD site, and he developed nausea. No fever. There were concerns that due to the age of his leads, complexity of the lead extraction given dualk coil and lead position. Ultimately, patient was sent to [facility] for device extraction. There are no plant for re-implant of a device.   Patient was last seen by cardiology on 06/14/23. At that time, patient denied chest pain, palpitations, syncope, near syncope, bleeding. He remained on carvedilol 3.125 mg BID, lasix 40 mg daily, crestor, potassium supplementation. In the past, GDMT for heart failure had been limited because patient gets orthostatic quickly. Also [facility] room for GDMT.    Patient presented to the ED on 10/19 complaining of intermittent central chest/epigastric pain, emesis, and shortness of breath that had been going on for 4 days. Pain increased when lying  on bed. Imaging in the ED suggested acute cholecystitis and patient was started on IV antibiotics. General surgery recommended HIDA scan, and noted that patient would be a poor surgical candidate. Planned to proceed with percutaneous drainage if HIDA scan was positive.   HIDA  scan was positive, and general sugery recommended perc chole. Completed earlier today. Cardiology consulted for perioperative risk evaluation.   On interview, patient reports that his physical activity has been limited by back and shoulder pain. He is only able to walk about 100 yards. Denies chest pain, but he does get short of breath quickly when walking. Notes that he can only walk a few feet before he starts to breathe heavily. Denies syncope, near syncope, palpitations. Feels like he has been doing well since his ICD was removed. Patient would like to consider surgery, but reports that most of the doctors this admission have told him that his heart could not handle it.   Past Medical History:  Diagnosis Date   AICD (automatic cardioverter/defibrillator) present    Arthritis    knees, back    BPH (benign prostatic hypertrophy)    CHF (congestive heart failure) (HCC)    Chronic kidney disease    BPH   Chronic systolic heart failure (HCC)    a. s/p MDT single chamber ICD 2004 as part of MASTER study b. upgrade to CRTD 2010; CRTD gen change 2016   Coronary artery disease    a. s/p anterior MI and CYPHER stent to LAD 2005   Dyslipidemia    Dysrhythmia    a-fib   Gout    Ischemic cardiomyopathy    Paroxysmal atrial fibrillation (HCC)    Ventricular tachycardia (HCC)     Past Surgical History:  Procedure Laterality Date   ANAL RECTAL MANOMETRY N/A 06/23/2023   Procedure: ANO RECTAL MANOMETRY;  Surgeon: Napoleon Form, MD;  Location: [facility] ENDOSCOPY;  Service: Gastroenterology;  Laterality: N/A;   APPENDECTOMY  1970   BI-VENTRICULAR IMPLANTABLE CARDIOVERTER DEFIBRILLATOR UPGRADE N/A 11/07/2014   a. MDT single chamber ICD implanted 2004 as part of MASTER study; upgrade to CRTD 2010; gen change 2016   BIV ICD GENERATOR CHANGEOUT N/A 11/12/2021   Procedure: BIV ICD GENERATOR CHANGEOUT;  Surgeon: Duke Salvia, MD;  Location: [facility] INVASIVE CV LAB;  Service: Cardiovascular;  Laterality:  N/A;   CARDIAC CATHETERIZATION  09/08/2003   x3 total of 5 stents   CARDIOVERSION N/A 07/04/2019   Procedure: CARDIOVERSION;  Surgeon: Wendall Stade, MD;  Location: [facility] ENDOSCOPY;  Service: Cardiovascular;  Laterality: N/A;   CARDIOVERSION N/A 08/17/2019   Procedure: CARDIOVERSION;  Surgeon: Vesta Mixer, MD;  Location: [facility] ENDOSCOPY;  Service: Cardiovascular;  Laterality: N/A;   CARDIOVERSION N/A 05/04/2022   Procedure: CARDIOVERSION;  Surgeon: Quintella Reichert, MD;  Location[facility] ENDOSCOPY;  Service: Cardiovascular;  Laterality: N/A;   PROSTATECTOMY  11/06/2011   Procedure: PROSTATECTOMY SUPRAPUBIC;  Surgeon: Kathi Ludwig, MD;  Location: [facility] ORS;  Service: Urology;  Laterality: N/A;  Open Suprapubic Prostatectomy   TEE WITHOUT CARDIOVERSION N/A 05/16/2019   Procedure: TRANSESOPHAGEAL ECHOCARDIOGRAM (TEE);  Surgeon: Sande Rives, MD;  Location: [facility] ENDOSCOPY;  Service: Cardiology;  Laterality: N/A;   TOTAL KNEE ARTHROPLASTY Left 10/06/2021   Procedure: TOTAL KNEE ARTHROPLASTY;  Surgeon: Jodi Geralds, MD;  Location: [facility] ORS;  Service: Orthopedics;  Laterality: Left;     Home Medications:  Prior to Admission medications   Medication Sig Start Date End Date Taking? Authorizing Provider  acetaminophen (TYLENOL) 325  MG tablet Take 2 tablets (650 mg total) by mouth every 4 (four) hours as needed for headache or mild pain. 05/27/23  Yes Sheilah Pigeon, PA-C  allopurinol (ZYLOPRIM) 100 MG tablet Take 100 mg by mouth at bedtime. 05/14/15  Yes [provider]  apixaban (ELIQUIS) 5 MG TABS tablet Take 5 mg by mouth 2 (two) times daily.   Yes [provider]  carvedilol (COREG) 3.125 MG tablet TAKE 1 TABLET TWICE DAILY 06/15/22  Yes Nahser, Deloris Ping, MD  furosemide (LASIX) 40 MG tablet Take 1 tablet (40 mg total) by mouth daily. 09/29/22  Yes Duke Salvia, MD  latanoprost (XALATAN) 0.005 % ophthalmic solution Place 1 drop into both eyes at bedtime.  10/03/13  Yes  [provider]  levothyroxine (SYNTHROID) 125 MCG tablet Take 125 mcg by mouth daily before breakfast.   Yes [provider]  nitroGLYCERIN (NITROSTAT) 0.4 MG SL tablet Place 1 tablet (0.4 mg total) under the tongue every 5 (five) minutes as needed for chest pain. 12/12/19  Yes Nahser, Deloris Ping, MD  Polyethyl Glycol-Propyl Glycol 0.4-0.3 % SOLN Place 1 drop into both eyes daily.   Yes [provider]  potassium chloride (KLOR-CON) 10 MEQ tablet Take 1 tablet (10 mEq total) by mouth daily. 09/29/22  Yes Duke Salvia, MD  rosuvastatin (CRESTOR) 10 MG tablet TAKE 1/2 TABLET EVERY DAY 03/24/23  Yes Nahser, Deloris Ping, MD  linezolid (ZYVOX) 600 MG tablet Take 600 mg by mouth 2 (two) times daily. Patient not taking: Reported on 06/27/2023 06/02/23   [provider]    Inpatient Medications: Scheduled Meds:  allopurinol  100 mg Oral QHS   apixaban  5 mg Oral BID   carvedilol  3.125 mg Oral BID WC   heparin  4,700 Units Intravenous Once   insulin aspart  0-9 Units Subcutaneous TID WC   latanoprost  1 drop Both Eyes QHS   ondansetron (ZOFRAN) IV  4 mg Intravenous Once   potassium chloride  10 mEq Oral Daily   rosuvastatin  5 mg Oral Daily   sodium chloride flush  5 mL Intracatheter Q8H   Continuous Infusions:  piperacillin-tazobactam (ZOSYN)  IV 3.375 g (06/29/23 0510)   PRN Meds: lip balm, technetium TC 82M mebrofenin  Allergies:    Allergies  Allergen Reactions   Lipitor [Atorvastatin] Other (See Comments)    MUSCLE ACHE    Jardiance [Empagliflozin] Other (See Comments)    Intolerance     Social History:   Social History   Socioeconomic History   Marital status: Married    Spouse name: Not on file   Number of children: Not on file   Years of education: Not on file   Highest education level: Not on file  Occupational History   Not on file  Tobacco Use   Smoking status: Never    Passive exposure: Never   Smokeless tobacco: Never  Vaping  Use   Vaping status: Never Used  Substance and Sexual Activity   Alcohol use: No    Comment: occasional beer    Drug use: No   Sexual activity: Not on file  Other Topics Concern   Not on file  Social History Narrative   Not on file   Social Determinants of Health   Financial Resource Strain: Not on file  Food Insecurity: No Food Insecurity (06/26/2023)   Hunger Vital Sign    Worried About Running Out of Food in the Last Year: Never true  Ran Out of Food in the Last Year: Never true  Transportation Needs: No Transportation Needs (06/26/2023)   PRAPARE - Administrator, Civil Service (Medical): No    Lack of Transportation (Non-Medical): No  Physical Activity: Not on file  Stress: Not on file  Social Connections: Not on file  Intimate Partner Violence: Not At Risk (06/26/2023)   Humiliation, Afraid, Rape, and Kick questionnaire    Fear of Current or Ex-Partner: No    Emotionally Abused: No    Physically Abused: No    Sexually Abused: No    Family History:    Family History  Problem Relation Age of Onset   Heart disease Sister        3 110f the 4 had HD   Heart disease Brother        2 of 3 had HD   Heart disease Sister        1 of the 6 has HD   Heart disease Brother      ROS:  Please see the history of present illness.   All other ROS reviewed and negative.     Physical Exam/Data:   Vitals:   06/29/23 1015 06/29/23 1020 06/29/23 1025 06/29/23 1029  BP: 113/74 126/70 131/69   Pulse: 79 80 79   Resp: 20 20 19    Temp:      TempSrc:      SpO2: 97% 98% 99% 96%  Weight:      Height:        Intake/Output Summary (Last 24 hours) at 06/29/2023 1425 Last data filed at 06/29/2023 1020 Gross per 24 hour  Intake 241.31 ml  Output 20 ml  Net 221.31 ml      06/29/2023    5:00 AM 06/28/2023    4:41 AM 06/27/2023    5:00 AM  Last 3 Weights  Weight (lbs) 227 lb 1.2 oz 222 lb 10.6 oz 222 lb 0.1 oz  Weight (kg) 103 kg 101 kg 100.7 kg     Body  mass index is 32.58 kg/m.  General:  Well nourished, well developed, in no acute distress. Sitting upright in the chair  HEENT: normal Neck: no JVD Vascular: Radial pulses 2+ bilaterally Cardiac:  normal S1, S2; RRR; no murmur  Lungs:  clear to auscultation bilaterally, no wheezing, rhonchi or rales. Normal work of breathing on room air  Abd: soft, mildly tender to palpation   Ext: trace edema in BLE  Musculoskeletal:  No deformities, BUE and BLE strength normal and equal Skin: warm and dry  Neuro:  No focal abnormalities noted Psych:  Normal affect   EKG:  The EKG was personally reviewed and demonstrates:  Atrial fibrillation, HR 81 BPM, nonspecific IVCD  Telemetry:  Telemetry was personally reviewed and demonstrates:  Atrial fibrillation, HR in the 60s-90s   Relevant CV Studies:  PET Stress test from 07/21/22    Findings are consistent with prior myocardial infarction. 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   LV perfusion is abnormal. There is evidence of infarction. Defect 1: There is a large defect with severe reduction in uptake present in the apical to basal anterior, anteroseptal and apex location(s) that is fixed. There is abnormal wall motion in the defect area. Consistent with infarction. Defect 2: There is a small defect with moderate reduction in uptake present  in the apical to mid inferoseptal location(s) that is fixed. There is abnormal wall motion in the defect area. Consistent with infarction.   Rest left ventricular function is abnormal. Rest global function is severely reduced. There were multiple regional abnormalities. Rest EF: 15 %. Stress left ventricular function is abnormal. Stress global function is severely reduced. There were multiple regional abnormalities. Stress EF: 10 %. End systolic cavity size is severely enlarged.  Recommend TTE for further evaluation of LVEF.   Myocardial blood flow was computed to be 0.40ml/g/min at rest and 0.20ml/g/min at stress. Global myocardial blood flow reserve was 1.83 and was abnormal.   Coronary calcium was present on the attenuation correction CT images. Severe coronary calcifications were present. Coronary calcifications were present in the left anterior descending artery, left circumflex artery and right coronary artery distribution(s)  Laboratory Data:  High Sensitivity Troponin:   Recent Labs  Lab 06/26/23 0626 06/26/23 0857  TROPONINIHS 26* 22*     Chemistry Recent Labs  Lab 06/26/23 0626 06/27/23 0919 06/28/23 0828  NA 138 135 137  K 4.2 3.9 4.0  CL 102 101 104  CO2 25 25 24   GLUCOSE 118* 235* 109*  BUN 17 16 16   CREATININE 1.13 1.34* 1.19  CALCIUM 9.3 8.6* 8.7*  MG  --  1.9  --   GFRNONAA >60 53* >60  ANIONGAP 11 9 9     Recent Labs  Lab 06/26/23 0857 06/27/23 0919  PROT 6.0* 6.0*  ALBUMIN 3.0* 2.9*  AST 23 22  ALT 18 16  ALKPHOS 76 87  BILITOT 0.9 1.1   Lipids  Recent Labs  Lab 06/27/23 0919  CHOL 82  TRIG 84  HDL 25*  LDLCALC 40  CHOLHDL 3.3    Hematology Recent Labs  Lab 06/26/23 0626 06/27/23 0919 06/28/23 0828  WBC 10.2 6.2 6.0  RBC 3.81* 3.39* 3.51*  HGB 11.7* 10.3* 10.7*  HCT 36.3* 32.4* 33.6*  MCV 95.3 95.6 95.7  MCH 30.7 30.4 30.5  MCHC 32.2 31.8 31.8  RDW 13.5 13.8 14.0  PLT 278 251 255   Thyroid  Recent Labs  Lab 06/28/23 0828  TSH 0.327*    BNP Recent Labs  Lab 06/26/23 0626  BNP 455.8*    DDimer No results for input(s): "DDIMER" in the last 168 hours.   Radiology/Studies:  NM Hepatobiliary Liver Func  Result Date: 06/28/2023 CLINICAL DATA:  Cholecystitis.  Abdominal pain. EXAM: NUCLEAR MEDICINE HEPATOBILIARY IMAGING TECHNIQUE: Sequential images of the abdomen were obtained out to 60 minutes following intravenous administration of radiopharmaceutical. RADIOPHARMACEUTICALS:  5.0 mCi Tc-20m   Choletec IV COMPARISON:  CT and ultrasound 06/26/2023 FINDINGS: Prompt clearance radiotracer from blood pool and homogeneous uptake in liver. Counts are evident in the small bowel by 20 minutes. Gallbladder fails to fill at 60 minutes. 3 mg IV morphine was administered to augment filling of the gallbladder. Gallbladder fails to fill following morphine augmentation. IMPRESSION: Non filling of the gallbladder is most consistent ACUTE CHOLECYSTITIS. Patent common bile duct. These results will be called to the ordering clinician or representative by the Radiologist Assistant, and communication documented in the PACS or [employer]. Electronically Signed   By: Genevive Bi M.D.   On: 06/28/2023 14:38   US Abdomen Limited RUQ (LIVER/GB)  Result Date: 06/26/2023 CLINICAL DATA:  Abdominal pain EXAM: ULTRASOUND ABDOMEN LIMITED RIGHT UPPER QUADRANT COMPARISON:  None Available. FINDINGS: Gallbladder: Gallbladder wall thickening. Sludge versus small stone seen in the gallbladder neck. No sonographic Murphy sign noted by  sonographer. Common bile duct: Diameter: 5.5 mm Liver: No focal lesion identified. Increased parenchymal echogenicity. Portal vein is patent on color Doppler imaging with normal direction of blood flow towards the liver. Other: Evaluation limited due to patient body habitus. IMPRESSION: 1. Gallbladder wall thickening with sludge versus small stone in the gallbladder neck, findings are concerning for acute cholecystitis, although sonographic Murphy sign was negative. Recommend clinical correlation. 2. Hepatic steatosis. Electronically Signed   By: Allegra Lai M.D.   On: 06/26/2023 10:48   CT ABDOMEN PELVIS W CONTRAST  Result Date: 06/26/2023 CLINICAL DATA:  epigastric pain EXAM: CT ABDOMEN AND PELVIS WITH CONTRAST TECHNIQUE: Multidetector CT imaging of the abdomen and pelvis was performed using the standard protocol following bolus administration of intravenous contrast. RADIATION DOSE  REDUCTION: This exam was performed according to the departmental dose-optimization program which includes automated exposure control, adjustment of the mA and/or kV according to patient size and/or use of iterative reconstruction technique. CONTRAST:  75mL OMNIPAQUE IOHEXOL 350 MG/ML SOLN COMPARISON:  CT scan abdomen and pelvis from 01/03/2021. FINDINGS: Lower chest: There are subpleural atelectatic changes in the visualized lung bases. No overt consolidation. No pleural effusion. The heart is normal in size. No pericardial effusion. Note is made of bilateral mild-to-moderate symmetric gynecomastia. Hepatobiliary: The liver is normal in size. Non-cirrhotic configuration. No suspicious mass. These is mild diffuse hepatic steatosis. No intrahepatic or extrahepatic bile duct dilation. There is small volume dependent sludge/tiny calculi in the gallbladder. There is mild diffuse gallbladder wall edema. There is mild pericholecystic fat stranding. There is suspected short segment of cystic duct which exhibits hyperattenuating contents (series 6, image 117), concerning for sludge within the cystic duct. In appropriate clinical settings, these findings suggest early/subacute cholecystitis. Correlate clinically. Pancreas: Unremarkable. No pancreatic ductal dilatation or surrounding inflammatory changes. Spleen: Within normal limits. No focal lesion. Adrenals/Urinary Tract: Adrenal glands are unremarkable. No suspicious renal mass. No hydronephrosis. No renal or ureteric calculi. Unremarkable urinary bladder. Stomach/Bowel: There is a small sliding hiatal hernia. There is a diverticulum arising from the second part of duodenum. No disproportionate dilation of the small or large bowel loops. No evidence of abnormal bowel wall thickening or inflammatory changes. The appendix is surgically absent. Vascular/Lymphatic: No ascites or pneumoperitoneum. No abdominal or pelvic lymphadenopathy, by size criteria. No aneurysmal dilation  of the major abdominal arteries. There are mild peripheral atherosclerotic vascular calcifications of the aorta and its major branches. Reproductive: Normal-sized prostate gland exhibiting post TURP defect. Symmetric bilateral seminal vesicles. Other: There is a tiny fat containing umbilical hernia. Tiny right and small left fat containing inguinal hernia noted. Musculoskeletal: No suspicious osseous lesions. There are mild multilevel degenerative changes in the visualized spine. IMPRESSION: 1. Cholelithiasis with mild diffuse gallbladder wall edema and mild pericholecystic fat stranding. There is suspected short segment of cystic duct which exhibits hyperattenuating contents, concerning for sludge within the cystic duct. In appropriate clinical settings, these findings suggest early/subacute cholecystitis. 2. Multiple other nonacute observations, as described above. Aortic Atherosclerosis (ICD10-I70.0). Electronically Signed   By: Jules Schick M.D.   On: 06/26/2023 09:01   DG Chest 2 View  Result Date: 06/26/2023 CLINICAL DATA:  Chest pain EXAM: CHEST - 2 VIEW COMPARISON:  05/26/2023 FINDINGS: Cardiac enlargement. Mild fissure thickening but no Kerley lines or cephalized blood flow. No effusion, pneumothorax, or consolidation. Coronary stenting. Interval defibrillator explant. IMPRESSION: No evidence of acute disease. Electronically Signed   By: Tiburcio Pea M.D.   On: 06/26/2023 06:44  Assessment and Plan:   Perioperative Risk Assessment  - Patient has long cardiac history (CAD, ischemic cardiomyopathy, history of ICD s/p explant, atrial fibrillation) - Previously had 2 stents placed to LAD (2001, 2005). Most recent ischemic evaluation was a PET stress test on 07/21/22 that was a high risk study due to presence of large anterior and septal infarcts, severely reduced EF that dropped with stress (EF 15% at rest, 10% with stress)  - Patient previously had ICD implanted in 2004 for primary  prevention- unfortunately, developed possible infection of ICD site in 05/2023 and had device extracted. Treated with Abx after extraction  - Patient reports only walking about 100 yards at a time, and does get significant shortness of breath when walking only a few yards. Denies exertional chest pain.  - With patient's low level of physical activity, he would likely need a nuclear stress test prior to surgery. However, his past nuclear stress test in 12/2019 and his more recent PETs stress test from 2023 were both high risk due to low EF, large area of infarct. Unlikely that repeat stress test would yield different findings.  - Overall, patient is high risk for surgery. Agree with perc chole acutely. With PET stress in 2023 being a high risk study, if there are plans for outpatient cholecystectomy in the future, he would need cardiac catheterization prior to any surgery.  Would recommend outpatient follow-up and consider cardiac catheterization once recovers from acute illness.  Based on results of cath, could proceed with cholecystectomy if no high risk findings.  CAD  - Previously had 2 stents placed to LAD (2001, 2005). Most recent ischemic evaluation was a PET stress test on 07/21/22 that was a high risk study due to presence of large anterior and septal infarcts, severely reduced EF that dropped with stress (EF 15% at rest, 10% with stress)  - Denies chest pain, physical activity limited by back and shoulder pain  - Not on ASA due to eliquis use.  - Continue carvedilol  - Continue crestor   Ischemic Cardiomyopathy  - Most recent echo from 05/2023 at [facility] showed severe LV dysfunction (EF 30%), normal RV function - In the past, patient has not been able to tolerate much GDMT due to low BP, orthostatic hypotension  - Continue carvedilol 3.125 mg BID  - Overall euvolemic on exam today  - Continue lasix   Persistent atrial fibrillation  - Most recently underwent cardioversion in 04/2022  - Now in  afib- HR well controlled, and patient overall asymptomatic - Continue carvedilol, eliquis   Otherwise per primary  - Acute cholecystitis  - Hepatic steatosis  - CKD  - Venous inefficiency - Hypothyroidism      Risk Assessment/Risk Scores:    New York Heart Association (NYHA) Functional Class NYHA Class III  CHA2DS2-VASc Score = 4  This indicates a 4.8% annual risk of stroke. The patient's score is based upon: CHF History: 1 HTN History: 0 Diabetes History: 0 Stroke History: 0 Vascular Disease History: 1 Age Score: 2 Gender Score: 0        For questions or updates, please contact [facility] Please consult [email or web address] for contact info under    Signed, Jonita Albee, PA-C  06/29/2023 2:25 PM  Patient seen and examined.  Agree with above documentation.  Mr. [name] is an 82 year old male with a history of chronic systolic heart failure due to ischemic cardiomyopathy, CAD, status post ICD with recent extraction, persistent atrial fibrillation who we are consulted  for preop evaluation at the request of Dr. Ninetta Lights.  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  Underwent ICD placement 2004, upgraded to CRT in 2010.  Underwent cath in 2005 with restenting of LAD at that time.  In 07/2022 he underwent cardiac PET which showed high risk findings, including large anterior/septal infarcts and severely reduced EF that dropped with stress (EF 15% at rest, dropped to 10% with stress).  No cardiac catheterization was done.  He was admitted 05/26/2023 with ICD infection, he was transferred to [facility] and underwent device extraction; there were no plans for reimplantation.  He presented to ED on 06/26/2023 with epigastric pain and shortness of breath.  Found to have acute cholecystitis and started on IV antibiotics.  He was evaluated by general surgery, felt to be poor surgical candidate and recommended percutaneous cholecystostomy.  This was done today, which patient  tolerated well.  Cardiology consulted for preoperative evaluation in case cholecystectomy is needed.  States that he gets short of breath with minimal exertion, walking up stairs causes him to be short of breath.  Denies chest pain.  On exam, patient is alert and oriented, regular rate and rhythm, no murmurs, lungs CTAB, trace LE edema.  For his preoperative evaluation, he underwent stress PET last year with high risk findings.  He also reports dyspnea with minimal exertion.  He would be high risk for surgical intervention.  I think prior to any surgical intervention he would need cardiac catheterization for further risk stratification given high risk findings on PET.  His acute cholecystitis has been treated with percutaneous cholecystostomy tube placement, though may need cholecystectomy in the future.  Would recommend outpatient follow-up and consider cardiac catheterization once recovers from acute illness.  Based on results of cath, could proceed with cholecystectomy if no high risk findings.  James Ishikawa, MD

## 2023-06-29 NOTE — Progress Notes (Signed)
PHARMACY - ANTICOAGULATION CONSULT NOTE  Pharmacy Consult for apixaban Indication: atrial fibrillation  Allergies  Allergen Reactions   Lipitor [Atorvastatin] Other (See Comments)    MUSCLE ACHE    Jardiance [Empagliflozin] Other (See Comments)    Intolerance     Patient Measurements: Height: 5\' 10"  (177.8 cm) Weight: 103 kg (227 lb 1.2 oz) IBW/kg (Calculated) : 73 :   Vital Signs: Temp: 97.6 F (36.4 C) (10/22 0726) Temp Source: Oral (10/22 0726) BP: 131/69 (10/22 1025) Pulse Rate: 79 (10/22 1025)  Labs: Recent Labs    06/26/23 2342 06/27/23 0919 06/27/23 1904 06/28/23 0828 06/28/23 1811 06/28/23 2349  HGB  --  10.3*  --  10.7*  --   --   HCT  --  32.4*  --  33.6*  --   --   PLT  --  251  --  255  --   --   APTT  --  40* 137* 152* 78*  --   LABPROT  --   --   --   --   --  15.8*  INR  --   --   --   --   --  1.2  HEPARINUNFRC >1.10*  --   --  >1.10* 0.78*  --   CREATININE  --  1.34*  --  1.19  --   --     Estimated Creatinine Clearance: 56.5 mL/min (by C-G formula based on SCr of 1.19 mg/dL).   Medical History: Past Medical History:  Diagnosis Date   AICD (automatic cardioverter/defibrillator) present    Arthritis    knees, back    BPH (benign prostatic hypertrophy)    CHF (congestive heart failure) (HCC)    Chronic kidney disease    BPH   Chronic systolic heart failure (HCC)    a. s/p MDT single chamber ICD 2004 as part of MASTER study b. upgrade to CRTD 2010; CRTD gen change 2016   Coronary artery disease    a. s/p anterior MI and CYPHER stent to LAD 2005   Dyslipidemia    Dysrhythmia    a-fib   Gout    Ischemic cardiomyopathy    Paroxysmal atrial fibrillation (HCC)    Ventricular tachycardia (HCC)      Plan:  Discontinue continuous infusion of unfractionated heparin and all associated labs that had been ordered as per protocol. Will commence apixaban, 5 mg PO BID.   Elicia Lamp, PharmD, CPP 06/29/2023,12:15 PM

## 2023-06-29 NOTE — Progress Notes (Signed)
  PHARMACY - ANTICOAGULATION CONSULT NOTE  Pharmacy Consult for recommencing continuous infusion of unfractionated heparin for atrial fibrillation status-post IR placement of percutaneous cholecystostomy drain placement. Indication: atrial fibrillation  Allergies  Allergen Reactions   Lipitor [Atorvastatin] Other (See Comments)    MUSCLE ACHE    Jardiance [Empagliflozin] Other (See Comments)    Intolerance     Patient Measurements: Height: 5\' 10"  (177.8 cm) Weight: 103 kg (227 lb 1.2 oz) IBW/kg (Calculated) : 73 Heparin Dosing Weight: 94.1 kg  Vital Signs: Temp: 97.6 F (36.4 C) (10/22 0726) Temp Source: Oral (10/22 0726) BP: 131/69 (10/22 1025) Pulse Rate: 79 (10/22 1025)  Labs: Recent Labs    06/26/23 2342 06/27/23 0919 06/27/23 1904 06/28/23 0828 06/28/23 1811 06/28/23 2349  HGB  --  10.3*  --  10.7*  --   --   HCT  --  32.4*  --  33.6*  --   --   PLT  --  251  --  255  --   --   APTT  --  40* 137* 152* 78*  --   LABPROT  --   --   --   --   --  15.8*  INR  --   --   --   --   --  1.2  HEPARINUNFRC >1.10*  --   --  >1.10* 0.78*  --   CREATININE  --  1.34*  --  1.19  --   --     Estimated Creatinine Clearance: 56.5 mL/min (by C-G formula based on SCr of 1.19 mg/dL).   Medical History: Past Medical History:  Diagnosis Date   AICD (automatic cardioverter/defibrillator) present    Arthritis    knees, back    BPH (benign prostatic hypertrophy)    CHF (congestive heart failure) (HCC)    Chronic kidney disease    BPH   Chronic systolic heart failure (HCC)    a. s/p MDT single chamber ICD 2004 as part of MASTER study b. upgrade to CRTD 2010; CRTD gen change 2016   Coronary artery disease    a. s/p anterior MI and CYPHER stent to LAD 2005   Dyslipidemia    Dysrhythmia    a-fib   Gout    Ischemic cardiomyopathy    Paroxysmal atrial fibrillation (HCC)    Ventricular tachycardia (HCC)    Assessment: Patient admitted with epigastric pain, concern for  cholecystitis. Hx of Afib on Eliquis, last dose 10/18 PM. HgB 11.7 and PLTs 278. Pharmacy consulted to dose heparin by continuous infusion intravenously after IR placement of percutaneous drain.Will use aPTT for now in the setting of recent DOAC intake.  Goal of Therapy:  Heparin level 0.3-0.7 units/ml aPTT 66 - 102 seconds Monitor platelets by anticoagulation protocol: Yes   Plan:  Afib/CVA or post interventional neuroradiological    Give 4,700 units bolus x 1. Commence continuous infusion of unfractionated heparin at 1,550 units per hour for which this infusion rate had yielded an appropriate aPTT prior to the heparin having to be discontinued for procedure. Will evaluate response with an aPTT and heparin level, 8 hours after bolus and recommencing.   Elicia Lamp, PharmD,CPP 06/29/2023,10:52 AM

## 2023-06-29 NOTE — Procedures (Signed)
Interventional Radiology Procedure Note  Procedure: Percutaneous Cholecystostomy drain placement   Indication: Acute Cholecystitis  Findings: Please refer to procedural dictation for full description.  Complications: None  EBL: < 10 mL  Acquanetta Belling, MD [phone]

## 2023-06-29 NOTE — Progress Notes (Signed)
Subjective: Patient was seen and examined at bedside this morning. Patient was just brought back to his room after having his percutaneous drain placed. Patient said the procedure went well. He does not report any abdominal pain, nausea, or vomiting. He had a bowel movement this morning and states that his last two bowel movement have appeared darker. He denies any history of GI bleed.  Objective:  Vital signs in last 24 hours: Vitals:   06/28/23 0752 06/28/23 1929 06/29/23 0500 06/29/23 0504  BP: 135/72 126/76  105/63  Pulse: 71 69  71  Resp: 18     Temp: 97.6 F (36.4 C) 97.7 F (36.5 C)  97.6 F (36.4 C)  TempSrc: Oral Oral  Oral  SpO2: 100% 96%  96%  Weight:   103 kg   Height:       Weight change: 2 kg  Intake/Output Summary (Last 24 hours) at 06/29/2023 0454 Last data filed at 06/29/2023 0300 Gross per 24 hour  Intake 241.31 ml  Output --  Net 241.31 ml    Physical Exam Constitutional:      Appearance: He is obese.  HENT:     Head: Normocephalic and atraumatic.  Cardiovascular:     Rate and Rhythm: Normal rate and regular rhythm.     Pulses: Normal pulses.  Pulmonary:     Effort: Pulmonary effort is normal.     Breath sounds: Normal breath sounds.  Abdominal:     General: Abdomen is flat. Bowel sounds are normal.     Tenderness: There is no abdominal tenderness. There is no guarding.     Comments: Percutaneous drain in place without any erythema, tenderness or edema around site.  Musculoskeletal:     Right lower leg: No edema.     Left lower leg: No edema.     Comments: Chronic venous stasis in the bilateral lower extremities.  Skin:    General: Skin is warm.  Neurological:     Mental Status: He is alert and oriented to person, place, and time.      Assessment/Plan:  Principal Problem:   Acute cholecystitis  Principal Problem:   Acute cholecystitis Patient with intermittent epigastric pain associated with meals that radiates to the back.  Endorses chills, nausea, and vomiting. Denies diarrhea. Limited improvement with tums and pepto bismol. Prior appendectomy, no recent abdominal surgeries. Very occasional alcohol use, no history of alcohol use disorder.    WBC 10.2 WNL, lipase 29. CT and RUQ US showing inflammation of the gallbladder wall as well as gallbladder thickening/edema/sludge and gallstones. On physical exam patient with epigastric tenderness and abdomen was noted to be slightly distended. General surgery was consulted in the ED.    Plan:  Patient received Tylenol once for his pain. He remains afebrile with no leukocytosis.  - Surgery recommends HIDA scan and likely percutaneous drainage if positive due to cardiac history. ACS NSQIP surgical risk calculator was 5.1% for serious complications. Results documented in media. - HIDA scan confirmed acute cholecystitis - Patient had percutaneous drainage placed by IR 10/22 - IV Zosyn until cultures from the drainage are resulted -Cardiology consulted to assess patient risk if needing laparoscopic cholecystectomy after drainage. Will follow recommendations.  - trend temperature  - monitor daily labs    Afib, rate controlled S/p ICD system extraction 06/01/2023. On Eliquis 5 mg BID and Carvedilol/Coreg 3.125 mg BID. EKG on admission with Afib, HR 81 BPM, Qtc 485 ms, LBBB (stable from 06/14/23 EKG). Asymptomatic on exam. Last  Eliquis dose yesterday, will hold and start IV heparin for possible surgical procedure. 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 Home medications include carvedilol 3.125 mg BID, furosemide/lasix 40 mg daily, potassium chloride 10 mEq BID. Sleeps flat and uses only one pillow under his head.    Follows regularly with Dr. Elease Hashimoto, MD [facility]), last visit 06/15/23, 91-month  follow up visit with Eligha Bridegroom, NP April 2025). BNP elevated at 455.8. Potassium 4.2 on admission. EKG Afib, stable LBBB. Troponins 26 > 22, peaked. CXR showing cardiac enlargement, negative for effusion or acute disease. Normal respiratory effort on room air and no crackles hear on exam today.  Plan:  - On Telemetry  - Resume home carvedilol 3.125 mg BID  - Will hold home Lasix 40 due to patient being NPO and improvement in lung sounds on exam today. Will continue to monitor. - Strict I/Os - Daily weights  - Monitor daily BMP, replete K as needed to maintain > 4 - F/u magnesium, maintain > 2 and replete as needed -Continuing home Potassium Cl 10 mEq BID     Chronic Conditions:  HLD  Hepatic steatosis Chronic. Last lipid panel 05/26/2022: cholesterol 133, triglycerides 164, HDL 33, VLDL 28, will repeat. Previously on atorvastatin but stopped due to joint pain. RUQ showing hepatic steatosis, will trend LFTs on CMP.  - Resume home Rosuvastatin 1/2 10 mg tablet daily  - Follow up CMP CKD Chronic. Serum Cr 1.13 on admission. Will continue to monitor.  Venous insufficiency Chronic. 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 Patient reports use of compression stockings at home. Recommend keeping skin moisturized and legs elevated.  Hypothyroidism Chronic. TSH 1.120 12/17/2022, will check TSH here. TSH was 0.327. - Continue home levothyroxine 125 mcg  Gout Chronic. No acute gout flare on admission, has been years since the last one.  - Continue home allopurinol 100 mg at bedtime   VTE Prophylaxis: Apixaban 5 BID Diet: Clear liquid Status: Full  Scherrie November, MS4  LOS: 3 days   Scherrie November, Medical Student 06/29/2023, 7:11 AM

## 2023-06-29 NOTE — Plan of Care (Signed)
  Problem: Education: Goal: Knowledge of General Education information will improve Description Including pain rating scale, medication(s)/side effects and non-pharmacologic comfort measures Outcome: Progressing   

## 2023-06-29 NOTE — Telephone Encounter (Signed)
Patient's daughter called stating her father is in the hospital waiting to have gallbladder surgery.  They won't do the surgery without clearance from cardiology, she said they put in a consult yesterday for cardiology but no one every came to see him.  She would like for Dr. Elease Hashimoto to give her a call.

## 2023-06-29 NOTE — Telephone Encounter (Signed)
Called and spoke with daughter [name] to inform her that Dr Elease Hashimoto checked this morning and pt was assigned to cardiology rounding and I had sent a reply via the MyChart message (but has not been read yet). She states they placed a drain instead of doing the surgery and she's upset about this. Advised that the decision for what's done is out of his hands.

## 2023-06-29 NOTE — Progress Notes (Signed)
PHARMACY - ANTICOAGULATION CONSULT NOTE  Pharmacy Consult for heparin  Indication: atrial fibrillation  Allergies  Allergen Reactions   Lipitor [Atorvastatin] Other (See Comments)    MUSCLE ACHE    Jardiance [Empagliflozin] Other (See Comments)    Intolerance     Patient Measurements: Height: 5\' 10"  (177.8 cm) Weight: 101 kg (222 lb 10.6 oz) IBW/kg (Calculated) : 73 Heparin Dosing Weight: 94.1kg   Vital Signs: Temp: 97.7 F (36.5 C) (10/21 1929) Temp Source: Oral (10/21 1929) BP: 126/76 (10/21 1929) Pulse Rate: 69 (10/21 1929)  Labs: Recent Labs    06/26/23 0626 06/26/23 0857 06/26/23 2341 06/26/23 2342 06/27/23 0919 06/27/23 1904 06/28/23 0828 06/28/23 1811 06/28/23 2349  HGB 11.7*  --   --   --  10.3*  --  10.7*  --   --   HCT 36.3*  --   --   --  32.4*  --  33.6*  --   --   PLT 278  --   --   --  251  --  255  --   --   APTT  --   --    < >  --  40* 137* 152* 78*  --   LABPROT  --   --   --   --   --   --   --   --  15.8*  INR  --   --   --   --   --   --   --   --  1.2  HEPARINUNFRC  --   --   --  >1.10*  --   --  >1.10* 0.78*  --   CREATININE 1.13  --   --   --  1.34*  --  1.19  --   --   TROPONINIHS 26* 22*  --   --   --   --   --   --   --    < > = values in this interval not displayed.    Estimated Creatinine Clearance: 56 mL/min (by C-G formula based on SCr of 1.19 mg/dL).   Medical History: Past Medical History:  Diagnosis Date   AICD (automatic cardioverter/defibrillator) present    Arthritis    knees, back    BPH (benign prostatic hypertrophy)    CHF (congestive heart failure) (HCC)    Chronic kidney disease    BPH   Chronic systolic heart failure (HCC)    a. s/p MDT single chamber ICD 2004 as part of MASTER study b. upgrade to CRTD 2010; CRTD gen change 2016   Coronary artery disease    a. s/p anterior MI and CYPHER stent to LAD 2005   Dyslipidemia    Dysrhythmia    a-fib   Gout    Ischemic cardiomyopathy    Paroxysmal atrial  fibrillation (HCC)    Ventricular tachycardia (HCC)     Assessment: Patient admitted with epigastric pain, concern for cholecystitis. Hx of Afib on Eliquis, last dose 10/18 PM. HgB 11.7 and PLTs 278. Pharmacy consulted to dose heparin gtt. Will use aPTT for now in the setting of recent DOAC intake.   aPTT therapeutic at 78 seconds. Heparin level 0.78, still falsely high due to recent DOAC use.  Goal of Therapy:  Heparin level 0.3-0.7 units/ml aPTT 66-102 sec Monitor platelets by anticoagulation protocol: Yes   Plan:  Continue heparin 1550 units/h Daily aPTT, heparin level, CBC  Fredonia Highland, PharmD, BCPS, BCCP Clinical Pharmacist Please check AMION  for all [facility] Pharmacy numbers 06/29/2023

## 2023-06-30 ENCOUNTER — Other Ambulatory Visit (HOSPITAL_COMMUNITY): Payer: Self-pay

## 2023-06-30 ENCOUNTER — Inpatient Hospital Stay (HOSPITAL_COMMUNITY): Payer: Medicare PPO

## 2023-06-30 DIAGNOSIS — I5042 Chronic combined systolic (congestive) and diastolic (congestive) heart failure: Secondary | ICD-10-CM | POA: Diagnosis not present

## 2023-06-30 DIAGNOSIS — K810 Acute cholecystitis: Secondary | ICD-10-CM | POA: Diagnosis not present

## 2023-06-30 DIAGNOSIS — K921 Melena: Secondary | ICD-10-CM

## 2023-06-30 DIAGNOSIS — Z01810 Encounter for preprocedural cardiovascular examination: Secondary | ICD-10-CM | POA: Diagnosis not present

## 2023-06-30 DIAGNOSIS — I2510 Atherosclerotic heart disease of native coronary artery without angina pectoris: Secondary | ICD-10-CM | POA: Diagnosis not present

## 2023-06-30 DIAGNOSIS — I25118 Atherosclerotic heart disease of native coronary artery with other forms of angina pectoris: Secondary | ICD-10-CM | POA: Diagnosis not present

## 2023-06-30 LAB — CBC WITH DIFFERENTIAL/PLATELET
Abs Immature Granulocytes: 0.02 10*3/uL (ref 0.00–0.07)
Basophils Absolute: 0 10*3/uL (ref 0.0–0.1)
Basophils Relative: 1 %
Eosinophils Absolute: 0.2 10*3/uL (ref 0.0–0.5)
Eosinophils Relative: 3 %
HCT: 33.6 % — ABNORMAL LOW (ref 39.0–52.0)
Hemoglobin: 11 g/dL — ABNORMAL LOW (ref 13.0–17.0)
Immature Granulocytes: 0 %
Lymphocytes Relative: 17 %
Lymphs Abs: 1 10*3/uL (ref 0.7–4.0)
MCH: 31.6 pg (ref 26.0–34.0)
MCHC: 32.7 g/dL (ref 30.0–36.0)
MCV: 96.6 fL (ref 80.0–100.0)
Monocytes Absolute: 0.8 10*3/uL (ref 0.1–1.0)
Monocytes Relative: 14 %
Neutro Abs: 4.1 10*3/uL (ref 1.7–7.7)
Neutrophils Relative %: 65 %
Platelets: 265 10*3/uL (ref 150–400)
RBC: 3.48 MIL/uL — ABNORMAL LOW (ref 4.22–5.81)
RDW: 14.2 % (ref 11.5–15.5)
WBC: 6.1 10*3/uL (ref 4.0–10.5)
nRBC: 0 % (ref 0.0–0.2)

## 2023-06-30 LAB — COMPREHENSIVE METABOLIC PANEL
ALT: 16 U/L (ref 0–44)
AST: 21 U/L (ref 15–41)
Albumin: 3 g/dL — ABNORMAL LOW (ref 3.5–5.0)
Alkaline Phosphatase: 80 U/L (ref 38–126)
Anion gap: 9 (ref 5–15)
BUN: 13 mg/dL (ref 8–23)
CO2: 24 mmol/L (ref 22–32)
Calcium: 8.6 mg/dL — ABNORMAL LOW (ref 8.9–10.3)
Chloride: 102 mmol/L (ref 98–111)
Creatinine, Ser: 1.43 mg/dL — ABNORMAL HIGH (ref 0.61–1.24)
GFR, Estimated: 49 mL/min — ABNORMAL LOW (ref >60)
Glucose, Bld: 95 mg/dL (ref 70–99)
Potassium: 4 mmol/L (ref 3.5–5.1)
Sodium: 135 mmol/L (ref 135–145)
Total Bilirubin: 1.2 mg/dL (ref 0.3–1.2)
Total Protein: 6.2 g/dL — ABNORMAL LOW (ref 6.5–8.1)

## 2023-06-30 LAB — GLUCOSE, CAPILLARY
Glucose-Capillary: 103 mg/dL — ABNORMAL HIGH (ref 70–99)
Glucose-Capillary: 142 mg/dL — ABNORMAL HIGH (ref 70–99)
Glucose-Capillary: 93 mg/dL (ref 70–99)
Glucose-Capillary: 215 mg/dL — ABNORMAL HIGH (ref 70–99)

## 2023-06-30 LAB — ECHOCARDIOGRAM COMPLETE
AR max vel: 2.65 cm2
AV Area VTI: 2.8 cm2
AV Area mean vel: 2.52 cm2
AV Mean grad: 3.6 mm[Hg]
AV Peak grad: 7.4 mm[Hg]
Ao pk vel: 1.36 m/s
Area-P 1/2: 4.56 cm2
Height: 70 in
S' Lateral: 5.45 cm
Weight: 3633.18 [oz_av]

## 2023-06-30 LAB — OCCULT BLOOD X 1 CARD TO LAB, STOOL: Fecal Occult Bld: NEGATIVE

## 2023-06-30 MED ORDER — PERFLUTREN LIPID MICROSPHERE
1.0000 mL | INTRAVENOUS | Status: AC | PRN
  Administered 2023-06-30: 6 mL via INTRAVENOUS

## 2023-06-30 MED ORDER — SODIUM CHLORIDE 0.9% FLUSH
10.0000 mL | INTRAVENOUS | 3 refills | Status: DC | PRN
  Filled 2023-06-30: qty 560, 56d supply, fill #0

## 2023-06-30 NOTE — Progress Notes (Signed)
Referring Physician(s): Dr. Dwain Sarna  Supervising Physician: Richarda Overlie  Patient Status:  [facility] - In-pt  Chief Complaint: Acute cholecystitis s/p percutaneous cholecystostomy drain placement in IR 06/29/23  Subjective: Patient resting comfortably in bed. Echocardiogram technician at the bedside finishing up his study. Patient states he feels better today than he did yesterday. He is unsure of discharge plans.   Allergies: Lipitor [atorvastatin] and Jardiance [empagliflozin]  Medications: Prior to Admission medications   Medication Sig Start Date End Date Taking? Authorizing Provider  acetaminophen (TYLENOL) 325 MG tablet Take 2 tablets (650 mg total) by mouth every 4 (four) hours as needed for headache or mild pain. 05/27/23  Yes Sheilah Pigeon, PA-C  allopurinol (ZYLOPRIM) 100 MG tablet Take 100 mg by mouth at bedtime. 05/14/15  Yes [provider]  apixaban (ELIQUIS) 5 MG TABS tablet Take 5 mg by mouth 2 (two) times daily.   Yes [provider]  carvedilol (COREG) 3.125 MG tablet TAKE 1 TABLET TWICE DAILY 06/15/22  Yes Nahser, Deloris Ping, MD  furosemide (LASIX) 40 MG tablet Take 1 tablet (40 mg total) by mouth daily. 09/29/22  Yes Duke Salvia, MD  latanoprost (XALATAN) 0.005 % ophthalmic solution Place 1 drop into both eyes at bedtime.  10/03/13  Yes [provider]  levothyroxine (SYNTHROID) 125 MCG tablet Take 125 mcg by mouth daily before breakfast.   Yes [provider]  nitroGLYCERIN (NITROSTAT) 0.4 MG SL tablet Place 1 tablet (0.4 mg total) under the tongue every 5 (five) minutes as needed for chest pain. 12/12/19  Yes Nahser, Deloris Ping, MD  Polyethyl Glycol-Propyl Glycol 0.4-0.3 % SOLN Place 1 drop into both eyes daily.   Yes [provider]  potassium chloride (KLOR-CON) 10 MEQ tablet Take 1 tablet (10 mEq total) by mouth daily. 09/29/22  Yes Duke Salvia, MD  rosuvastatin (CRESTOR) 10 MG tablet TAKE 1/2 TABLET EVERY DAY 03/24/23  Yes  Nahser, Deloris Ping, MD  linezolid (ZYVOX) 600 MG tablet Take 600 mg by mouth 2 (two) times daily. Patient not taking: Reported on 06/27/2023 06/02/23   [provider]     Vital Signs: BP 113/65 (BP Location: Left Arm)   Pulse 71   Temp 97.7 F (36.5 C) (Oral)   Resp 19   Ht 5\' 10"  (1.778 m)   Wt 227 lb 1.2 oz (103 kg)   SpO2 100%   BMI 32.58 kg/m   Physical Exam Constitutional:      General: He is not in acute distress.    Appearance: He is not ill-appearing.  Pulmonary:     Effort: Pulmonary effort is normal.  Abdominal:     Palpations: Abdomen is soft.     Tenderness: There is abdominal tenderness.     Comments: RUQ drain to gravity. Approximately 80 ml of serosanguineous fluid in bag. Drain easily flushed with 5 ml normal saline. Site mildly tender to palpation. Dressing is clean/dry/intact.   Skin:    General: Skin is warm and dry.  Neurological:     Mental Status: He is alert and oriented to person, place, and time.  Psychiatric:        Mood and Affect: Mood normal.        Behavior: Behavior normal.        Thought Content: Thought content normal.        Judgment: Judgment normal.     Imaging: IR Perc Cholecystostomy  Result Date: 06/29/2023 INDICATION: 82 year old gentleman with acute cholecystitis is  a poor surgical candidate and requires percutaneous cholecystostomy drain placement. EXAM: Ultrasound and fluoroscopy guided cholecystostomy drain placement MEDICATIONS: Patient currently on appropriate inpatient regimen of antibiotics for this procedure. ANESTHESIA/SEDATION: Moderate (conscious) sedation was employed during this procedure. A total of Versed 1 mg and Fentanyl 50 mcg was administered intravenously. Moderate Sedation Time: 10 minutes. The patient's level of consciousness and vital signs were monitored continuously by radiology nursing throughout the procedure under my direct supervision. FLUOROSCOPY TIME:  Radiation Exposure Index (as provided by  the fluoroscopic device): 6 mGy Kerma COMPLICATIONS: None immediate. PROCEDURE: Informed written consent was obtained from the patient after a thorough discussion of the procedural risks, benefits and alternatives. All questions were addressed. Maximal Sterile Barrier Technique was utilized including caps, mask, sterile gowns, sterile gloves, sterile drape, hand hygiene and skin antiseptic. A timeout was performed prior to the initiation of the procedure. Patient positioned supine on the angiography table. Right upper quadrant abdominal skin prepped and draped in the usual sterile fashion. Sterile probe cover and sterile gel utilized throughout the procedure. Using ultrasound and fluoroscoping guidance, the gallbladder was accessed was accessed with a 21 ga needle. Contrast administered through the needle confirmed appropriate access within the gallbladder lumen. 0.018 inch guidewire advanced into the gallbladder lumen and the needle was exchanged for a transitional dilator set. Percutaneous contrast administration demonstrated appropriate access the gallbladder lumen. Filling defect consistent with gallstone. 10.2 fr multipurpose pigtail drain was inserted over the guidewire. The catheter tip was positioned in the gallbladder lumen. The catheter was secured to skin with suture and connected to bag. IMPRESSION: Successful cholecystostomy drain placement (10.2 [place]) Electronically Signed   By: Acquanetta Belling M.D.   On: 06/29/2023 16:58   NM Hepatobiliary Liver Func  Result Date: 06/28/2023 CLINICAL DATA:  Cholecystitis.  Abdominal pain. EXAM: NUCLEAR MEDICINE HEPATOBILIARY IMAGING TECHNIQUE: Sequential images of the abdomen were obtained out to 60 minutes following intravenous administration of radiopharmaceutical. RADIOPHARMACEUTICALS:  5.0 mCi Tc-33m  Choletec IV COMPARISON:  CT and ultrasound 06/26/2023 FINDINGS: Prompt clearance radiotracer from blood pool and homogeneous uptake in liver. Counts are evident  in the small bowel by 20 minutes. Gallbladder fails to fill at 60 minutes. 3 mg IV morphine was administered to augment filling of the gallbladder. Gallbladder fails to fill following morphine augmentation. IMPRESSION: Non filling of the gallbladder is most consistent ACUTE CHOLECYSTITIS. Patent common bile duct. These results will be called to the ordering clinician or representative by the Radiologist Assistant, and communication documented in the PACS or [employer]. Electronically Signed   By: Genevive Bi M.D.   On: 06/28/2023 14:38    Labs:  CBC: Recent Labs    06/26/23 0626 06/27/23 0919 06/28/23 0828 06/30/23 0536  WBC 10.2 6.2 6.0 6.1  HGB 11.7* 10.3* 10.7* 11.0*  HCT 36.3* 32.4* 33.6* 33.6*  PLT 278 251 255 265    COAGS: Recent Labs    06/27/23 0919 06/27/23 1904 06/28/23 0828 06/28/23 1811 06/28/23 2349  INR  --   --   --   --  1.2  APTT 40* 137* 152* 78*  --     BMP: Recent Labs    06/26/23 0626 06/27/23 0919 06/28/23 0828 06/30/23 0536  NA 138 135 137 135  K 4.2 3.9 4.0 4.0  CL 102 101 104 102  CO2 25 25 24 24   GLUCOSE 118* 235* 109* 95  BUN 17 16 16 13   CALCIUM 9.3 8.6* 8.7* 8.6*  CREATININE 1.13 1.34* 1.19 1.43*  GFRNONAA >60 53* >60 49*    LIVER FUNCTION TESTS: Recent Labs    06/26/23 0857 06/27/23 0919 06/30/23 0536  BILITOT 0.9 1.1 1.2  AST 23 22 21   ALT 18 16 16   ALKPHOS 76 87 80  PROT 6.0* 6.0* 6.2*  ALBUMIN 3.0* 2.9* 3.0*    Assessment and Plan:  Acute cholecystitis s/p percutaneous cholecystostomy drain placement in IR 06/29/23  Drain Location: RUQ Size: Fr size: 10 Fr Date of placement: 06/29/23 Currently to: Drain collection device: gravity 24 hour output:  Output by Drain (mL) 06/28/23 0700 - 06/28/23 1459 06/28/23 1500 - 06/28/23 2259 06/28/23 2300 - 06/29/23 0659 06/29/23 0700 - 06/29/23 1459 06/29/23 1500 - 06/29/23 2259 06/29/23 2300 - 06/30/23 0659 06/30/23 0700 - 06/30/23 1421  Biliary Tube Other (Comment)  10 Fr. RUQ    20  40     Interval imaging/drain manipulation:  None  Current examination: Flushes/aspirates easily.  Insertion site unremarkable. Suture and stat lock in place. Dressed appropriately.   Plan: Continue TID flushes with 5 cc NS. Record output Q shift. Dressing changes QD or PRN if soiled.  Call IR APP or on call IR MD if difficulty flushing or sudden change in drain output.  Repeat imaging/possible drain injection once output < 10 mL/QD (excluding flush material). Consideration for drain removal if output is < 10 mL/QD (excluding flush material), pending discussion with the providing surgical service.  Discharge planning: Percutaneous cholecystostomy drain to remain in place at least 6 weeks. Recommend fluoroscopy with injection of the drain in IR to evaluate for patency of the cystic duct. An order has been placed for a scheduler from our clinic to follow up with the patient after discharge to arrange an appointment. A prescription for saline flushes has been submitted to the [facility] pharmacy.  Drain care teaching done with the patient today but his daughter will also need to be taught how to care for the drain.  If the duct is patent and general surgery feels patient is stable for cholecystectomy, the drain would be removed at time of surgery. If the duct is patent and general surgery feels patient is NEVER a candidate for cholecystectomy, drain can be capped for a trial. If symptoms recur, then place to gravity bag again. If trial is successful, discuss possible removal of the drain.  IR will continue to follow - please call with questions or concerns.  Electronically Signed: Alwyn Ren, AGACNP-BC [phone] 06/30/2023, 2:19 PM   I spent a total of 15 Minutes at the the patient's bedside AND on the patient's hospital floor or unit, greater than 50% of which was counseling/coordinating care for percutaneous cholecystostomy.

## 2023-06-30 NOTE — Progress Notes (Signed)
Patient ID: [name], male   DOB: [birth year], 82 y.o.   MRN: [id number] [facility] Surgery Progress Note     Subjective: CC-  Up in chair. Doing well since perc chole placement. Denies abdominal pain. Tolerated breakfast. No n/v. He had a BM this morning. Asking why his stool is black. States that he first noticed it 10/20. Hgb is 11 (12.5 from 2 weeks ago). Denies h/o gastric ulcers. Nonsmoker. Denies alcohol use. Seldomly uses NSAIDs. GI is Dr. Lavon Paganini.  Objective: Vital signs in last 24 hours: Temp:  [97.7 F (36.5 C)-98.3 F (36.8 C)] 97.7 F (36.5 C) (10/23 0723) Pulse Rate:  [70-80] 71 (10/23 0723) Resp:  [18-20] 19 (10/23 0500) BP: (96-131)/(59-74) 113/65 (10/23 0723) SpO2:  [92 %-100 %] 100 % (10/23 0723) Last BM Date : 06/29/23  Intake/Output from previous day: 10/22 0701 - 10/23 0700 In: 890.5 [P.O.:360; I.V.:8; IV Piggyback:507.5] Out: 240 [Urine:180; Drains:60] Intake/Output this shift: No intake/output data recorded.  PE: Gen:  Alert, NAD, pleasant Abd: soft, ND, NT, perc chole present with scant serosanguinous fluid in bag  Lab Results:  Recent Labs    06/28/23 0828 06/30/23 0536  WBC 6.0 6.1  HGB 10.7* 11.0*  HCT 33.6* 33.6*  PLT 255 265   BMET Recent Labs    06/28/23 0828 06/30/23 0536  NA 137 135  K 4.0 4.0  CL 104 102  CO2 24 24  GLUCOSE 109* 95  BUN 16 13  CREATININE 1.19 1.43*  CALCIUM 8.7* 8.6*   PT/INR Recent Labs    06/28/23 2349  LABPROT 15.8*  INR 1.2   CMP     Component Value Date/Time   NA 135 06/30/2023 0536   NA 141 05/21/2023 1342   K 4.0 06/30/2023 0536   CL 102 06/30/2023 0536   CO2 24 06/30/2023 0536   GLUCOSE 95 06/30/2023 0536   BUN 13 06/30/2023 0536   BUN 18 05/21/2023 1342   CREATININE 1.43 (H) 06/30/2023 0536   CREATININE 1.03 06/24/2016 1201   CALCIUM 8.6 (L) 06/30/2023 0536   PROT 6.2 (L) 06/30/2023 0536   PROT 6.5 03/31/2022 1619   ALBUMIN 3.0 (L) 06/30/2023 0536   ALBUMIN 3.9  03/31/2022 1619   AST 21 06/30/2023 0536   ALT 16 06/30/2023 0536   ALKPHOS 80 06/30/2023 0536   BILITOT 1.2 06/30/2023 0536   BILITOT 0.5 03/31/2022 1619   GFRNONAA 49 (L) 06/30/2023 0536   GFRAA 64 10/16/2020 1414   Lipase     Component Value Date/Time   LIPASE 29 06/26/2023 0857       Studies/Results: IR Perc Cholecystostomy  Result Date: 06/29/2023 INDICATION: 82 year old gentleman with acute cholecystitis is a poor surgical candidate and requires percutaneous cholecystostomy drain placement. EXAM: Ultrasound and fluoroscopy guided cholecystostomy drain placement MEDICATIONS: Patient currently on appropriate inpatient regimen of antibiotics for this procedure. ANESTHESIA/SEDATION: Moderate (conscious) sedation was employed during this procedure. A total of Versed 1 mg and Fentanyl 50 mcg was administered intravenously. Moderate Sedation Time: 10 minutes. The patient's level of consciousness and vital signs were monitored continuously by radiology nursing throughout the procedure under my direct supervision. FLUOROSCOPY TIME:  Radiation Exposure Index (as provided by the fluoroscopic device): 6 mGy Kerma COMPLICATIONS: None immediate. PROCEDURE: Informed written consent was obtained from the patient after a thorough discussion of the procedural risks, benefits and alternatives. All questions were addressed. Maximal Sterile Barrier Technique was utilized including caps, mask, sterile gowns, sterile gloves, sterile drape, hand hygiene and  skin antiseptic. A timeout was performed prior to the initiation of the procedure. Patient positioned supine on the angiography table. Right upper quadrant abdominal skin prepped and draped in the usual sterile fashion. Sterile probe cover and sterile gel utilized throughout the procedure. Using ultrasound and fluoroscoping guidance, the gallbladder was accessed was accessed with a 21 ga needle. Contrast administered through the needle confirmed appropriate  access within the gallbladder lumen. 0.018 inch guidewire advanced into the gallbladder lumen and the needle was exchanged for a transitional dilator set. Percutaneous contrast administration demonstrated appropriate access the gallbladder lumen. Filling defect consistent with gallstone. 10.2 fr multipurpose pigtail drain was inserted over the guidewire. The catheter tip was positioned in the gallbladder lumen. The catheter was secured to skin with suture and connected to bag. IMPRESSION: Successful cholecystostomy drain placement (10.2 [place]) Electronically Signed   By: Acquanetta Belling M.D.   On: 06/29/2023 16:58   NM Hepatobiliary Liver Func  Result Date: 06/28/2023 CLINICAL DATA:  Cholecystitis.  Abdominal pain. EXAM: NUCLEAR MEDICINE HEPATOBILIARY IMAGING TECHNIQUE: Sequential images of the abdomen were obtained out to 60 minutes following intravenous administration of radiopharmaceutical. RADIOPHARMACEUTICALS:  5.0 mCi Tc-58m  Choletec IV COMPARISON:  CT and ultrasound 06/26/2023 FINDINGS: Prompt clearance radiotracer from blood pool and homogeneous uptake in liver. Counts are evident in the small bowel by 20 minutes. Gallbladder fails to fill at 60 minutes. 3 mg IV morphine was administered to augment filling of the gallbladder. Gallbladder fails to fill following morphine augmentation. IMPRESSION: Non filling of the gallbladder is most consistent ACUTE CHOLECYSTITIS. Patent common bile duct. These results will be called to the ordering clinician or representative by the Radiologist Assistant, and communication documented in the PACS or [employer]. Electronically Signed   By: Genevive Bi M.D.   On: 06/28/2023 14:38    Anti-infectives: Anti-infectives (From admission, onward)    Start     Dose/Rate Route Frequency Ordered Stop   06/28/23 1830  cefOXitin (MEFOXIN) 2 g in sodium chloride 0.9 % 100 mL IVPB        2 g 200 mL/hr over 30 Minutes Intravenous  Once 06/28/23 1735 06/29/23 0130    06/26/23 2100  piperacillin-tazobactam (ZOSYN) IVPB 3.375 g        3.375 g 12.5 mL/hr over 240 Minutes Intravenous Every 8 hours 06/26/23 1810     06/26/23 1245  piperacillin-tazobactam (ZOSYN) IVPB 3.375 g        3.375 g 100 mL/hr over 30 Minutes Intravenous  Once 06/26/23 1243 06/26/23 1421        Assessment/Plan  Cholecystitis  - s/p perc chole 10/22. Culture pending. Continue antibiotics for about 10 days total. Will arrange follow up with Dr. Dwain Sarna in the office to discuss cholecystectomy. He needs to see IR for drain study prior to this appointment. Appreciate cardiology eval, they will plan for outpatient follow up and consideration of cardiac catheterization once recovers from acute illness. - check stool for blood  ID - zosyn FEN - soft diet VTE - eliquis Foley - none  I reviewed Consultant cardiology notes, last 24 h vitals and pain scores, last 48 h intake and output, last 24 h labs and trends, and last 24 h imaging results.    LOS: 4 days    Franne Forts, [facility] Surgery 06/30/2023, 9:38 AM Please see Amion for pager number during day hours 7:00am-4:30pm

## 2023-06-30 NOTE — Progress Notes (Signed)
Rounding Note    Patient Name: [name] Date of Encounter: 06/30/2023  [facility] Cardiologist: Kristeen Miss, MD   Subjective   Denies any chest pain or dyspnea  Inpatient Medications    Scheduled Meds:  acetaminophen  1,000 mg Oral Q8H   allopurinol  100 mg Oral QHS   apixaban  5 mg Oral BID   carvedilol  3.125 mg Oral BID WC   heparin  4,700 Units Intravenous Once   insulin aspart  0-9 Units Subcutaneous TID WC   latanoprost  1 drop Both Eyes QHS   ondansetron (ZOFRAN) IV  4 mg Intravenous Once   potassium chloride  10 mEq Oral Daily   rosuvastatin  5 mg Oral Daily   sodium chloride flush  5 mL Intracatheter Q8H   Continuous Infusions:  piperacillin-tazobactam (ZOSYN)  IV 12.5 mL/hr at 06/30/23 0536   PRN Meds: HYDROmorphone, lip balm, technetium TC 101M mebrofenin   Vital Signs    Vitals:   06/29/23 1029 06/29/23 2100 06/30/23 0500 06/30/23 0723  BP:  (!) 96/59 113/65 113/65  Pulse:  79 76 71  Resp:  20 19   Temp:  98.3 F (36.8 C) 97.7 F (36.5 C) 97.7 F (36.5 C)  TempSrc:  Oral Oral Oral  SpO2: 96% 92% 93% 100%  Weight:      Height:        Intake/Output Summary (Last 24 hours) at 06/30/2023 1101 Last data filed at 06/30/2023 0536 Gross per 24 hour  Intake 890.5 ml  Output 220 ml  Net 670.5 ml      06/29/2023    5:00 AM 06/28/2023    4:41 AM 06/27/2023    5:00 AM  Last 3 Weights  Weight (lbs) 227 lb 1.2 oz 222 lb 10.6 oz 222 lb 0.1 oz  Weight (kg) 103 kg 101 kg 100.7 kg      Telemetry    Atrial fibrillation with controlled rates- Personally Reviewed  ECG    No new ECG- Personally Reviewed  Physical Exam   GEN: No acute distress.   Neck: No JVD Cardiac: RRR, no murmurs, rubs, or gallops.  Respiratory: Clear to auscultation bilaterally. GI: Soft, nontender, non-distended  MS: No edema; No deformity. Neuro:  Nonfocal  Psych: Normal affect   Labs    High Sensitivity Troponin:   Recent Labs  Lab  06/26/23 0626 06/26/23 0857  TROPONINIHS 26* 22*     Chemistry Recent Labs  Lab 06/26/23 0857 06/27/23 0919 06/28/23 0828 06/30/23 0536  NA  --  135 137 135  K  --  3.9 4.0 4.0  CL  --  101 104 102  CO2  --  25 24 24   GLUCOSE  --  235* 109* 95  BUN  --  16 16 13   CREATININE  --  1.34* 1.19 1.43*  CALCIUM  --  8.6* 8.7* 8.6*  MG  --  1.9  --   --   PROT 6.0* 6.0*  --  6.2*  ALBUMIN 3.0* 2.9*  --  3.0*  AST 23 22  --  21  ALT 18 16  --  16  ALKPHOS 76 87  --  80  BILITOT 0.9 1.1  --  1.2  GFRNONAA  --  53* >60 49*  ANIONGAP  --  9 9 9     Lipids  Recent Labs  Lab 06/27/23 0919  CHOL 82  TRIG 84  HDL 25*  LDLCALC 40  CHOLHDL 3.3  Hematology Recent Labs  Lab 06/27/23 0919 06/28/23 0828 06/30/23 0536  WBC 6.2 6.0 6.1  RBC 3.39* 3.51* 3.48*  HGB 10.3* 10.7* 11.0*  HCT 32.4* 33.6* 33.6*  MCV 95.6 95.7 96.6  MCH 30.4 30.5 31.6  MCHC 31.8 31.8 32.7  RDW 13.8 14.0 14.2  PLT 251 255 265   Thyroid  Recent Labs  Lab 06/28/23 0828  TSH 0.327*    BNP Recent Labs  Lab 06/26/23 0626  BNP 455.8*    DDimer No results for input(s): "DDIMER" in the last 168 hours.   Radiology    IR Perc Cholecystostomy  Result Date: 06/29/2023 INDICATION: 82 year old gentleman with acute cholecystitis is a poor surgical candidate and requires percutaneous cholecystostomy drain placement. EXAM: Ultrasound and fluoroscopy guided cholecystostomy drain placement MEDICATIONS: Patient currently on appropriate inpatient regimen of antibiotics for this procedure. ANESTHESIA/SEDATION: Moderate (conscious) sedation was employed during this procedure. A total of Versed 1 mg and Fentanyl 50 mcg was administered intravenously. Moderate Sedation Time: 10 minutes. The patient's level of consciousness and vital signs were monitored continuously by radiology nursing throughout the procedure under my direct supervision. FLUOROSCOPY TIME:  Radiation Exposure Index (as provided by the fluoroscopic  device): 6 mGy Kerma COMPLICATIONS: None immediate. PROCEDURE: Informed written consent was obtained from the patient after a thorough discussion of the procedural risks, benefits and alternatives. All questions were addressed. Maximal Sterile Barrier Technique was utilized including caps, mask, sterile gowns, sterile gloves, sterile drape, hand hygiene and skin antiseptic. A timeout was performed prior to the initiation of the procedure. Patient positioned supine on the angiography table. Right upper quadrant abdominal skin prepped and draped in the usual sterile fashion. Sterile probe cover and sterile gel utilized throughout the procedure. Using ultrasound and fluoroscoping guidance, the gallbladder was accessed was accessed with a 21 ga needle. Contrast administered through the needle confirmed appropriate access within the gallbladder lumen. 0.018 inch guidewire advanced into the gallbladder lumen and the needle was exchanged for a transitional dilator set. Percutaneous contrast administration demonstrated appropriate access the gallbladder lumen. Filling defect consistent with gallstone. 10.2 fr multipurpose pigtail drain was inserted over the guidewire. The catheter tip was positioned in the gallbladder lumen. The catheter was secured to skin with suture and connected to bag. IMPRESSION: Successful cholecystostomy drain placement (10.2 [place]) Electronically Signed   By: Acquanetta Belling M.D.   On: 06/29/2023 16:58   NM Hepatobiliary Liver Func  Result Date: 06/28/2023 CLINICAL DATA:  Cholecystitis.  Abdominal pain. EXAM: NUCLEAR MEDICINE HEPATOBILIARY IMAGING TECHNIQUE: Sequential images of the abdomen were obtained out to 60 minutes following intravenous administration of radiopharmaceutical. RADIOPHARMACEUTICALS:  5.0 mCi Tc-17m  Choletec IV COMPARISON:  CT and ultrasound 06/26/2023 FINDINGS: Prompt clearance radiotracer from blood pool and homogeneous uptake in liver. Counts are evident in the small  bowel by 20 minutes. Gallbladder fails to fill at 60 minutes. 3 mg IV morphine was administered to augment filling of the gallbladder. Gallbladder fails to fill following morphine augmentation. IMPRESSION: Non filling of the gallbladder is most consistent ACUTE CHOLECYSTITIS. Patent common bile duct. These results will be called to the ordering clinician or representative by the Radiologist Assistant, and communication documented in the PACS or [employer]. Electronically Signed   By: Genevive Bi M.D.   On: 06/28/2023 14:38    Cardiac Studies     Patient Profile     82 y.o. male  history of chronic systolic heart failure due to ischemic cardiomyopathy, CAD, status post ICD with recent  extraction, persistent atrial fibrillation who we are consulted for preop evaluation   Assessment & Plan    Perioperative Risk Assessment: Patient has long cardiac history (CAD, ischemic cardiomyopathy, history of ICD s/p explant, atrial fibrillation). Previously had 2 stents placed to LAD (2001, 2005). Most recent ischemic evaluation was a PET stress test on 07/21/22 that was a high risk study due to presence of large anterior and septal infarcts, severely reduced EF that dropped with stress (EF 15% at rest, 10% with stress). Patient previously had ICD implanted in 2004 for primary prevention- unfortunately, developed possible infection of ICD site in 05/2023 and had device extracted. Treated with Abx after extraction. Patient reports only walking about 100 yards at a time, and does get significant shortness of breath when walking only a few yards. Denies exertional chest pain. His past nuclear stress test in 12/2019 and his more recent PETs stress test from 2023 were both high risk due to low EF, large area of infarct.  - Update echocardiogram - Overall, patient is high risk for surgery. Agree with perc chole acutely. With PET stress in 2023 being a high risk study, if there are plans for outpatient  cholecystectomy in the future, he would need cardiac catheterization prior to any surgery.  Would recommend outpatient follow-up and consider cardiac catheterization once recovers from acute illness.  Based on results of cath, could proceed with cholecystectomy if no high risk findings.   CAD: Previously had 2 stents placed to LAD (2001, 2005). Most recent ischemic evaluation was a PET stress test on 07/21/22 that was a high risk study due to presence of large anterior and septal infarcts, severely reduced EF that dropped with stress (EF 15% at rest, 10% with stress). Denies chest pain, physical activity limited by back and shoulder pain  - Not on ASA due to eliquis use.  - Continue carvedilol  - Continue crestor  - Plan outpatient follow-up to consider cardiac catheterization as above   Ischemic Cardiomyopathy: Most recent echo from 05/2023 at [facility] showed severe LV dysfunction (EF 30%), normal RV function - In the past, patient has not been able to tolerate much GDMT due to low BP, orthostatic hypotension  - Continue carvedilol 3.125 mg BID  - Overall euvolemic on exam today    Persistent atrial fibrillation: Most recently underwent cardioversion in 04/2022. Now in afib- HR well controlled, and patient overall asymptomatic - Continue carvedilol, eliquis    Otherwise per primary  - Acute cholecystitis  - Hepatic steatosis  - CKD  - Venous inefficiency - Hypothyroidism  For questions or updates, please contact [facility] Please consult [email or web address] for contact info under        Signed, Little Ishikawa, MD  06/30/2023, 11:01 AM

## 2023-06-30 NOTE — Plan of Care (Signed)
Patient alert/oriented X4. Patient compliant with medication administration and tolerated IV abx. Patient reported 2 black stools earlier today, pending fecal occult stool sample. Patient ambulated in room/halls independently. Patient has no complaints/issues at this time, VSS.   Problem: Education: Goal: Knowledge of General Education information will improve Description: Including pain rating scale, medication(s)/side effects and non-pharmacologic comfort measures Outcome: Progressing   Problem: Health Behavior/Discharge Planning: Goal: Ability to manage health-related needs will improve Outcome: Progressing   Problem: Clinical Measurements: Goal: Ability to maintain clinical measurements within normal limits will improve Outcome: Progressing   Problem: Clinical Measurements: Goal: Will remain free from infection Outcome: Progressing   Problem: Clinical Measurements: Goal: Diagnostic test results will improve Outcome: Progressing   Problem: Clinical Measurements: Goal: Respiratory complications will improve Outcome: Progressing   Problem: Coping: Goal: Level of anxiety will decrease Outcome: Progressing   Problem: Elimination: Goal: Will not experience complications related to bowel motility Outcome: Progressing   Problem: Elimination: Goal: Will not experience complications related to urinary retention Outcome: Progressing   Problem: Pain Managment: Goal: General experience of comfort will improve Outcome: Progressing   Problem: Safety: Goal: Ability to remain free from injury will improve Outcome: Progressing   Problem: Skin Integrity: Goal: Risk for impaired skin integrity will decrease Outcome: Progressing   Problem: Education: Goal: Knowledge of General Education information will improve Description: Including pain rating scale, medication(s)/side effects and non-pharmacologic comfort measures Outcome: Progressing   Problem: Health Behavior/Discharge  Planning: Goal: Ability to manage health-related needs will improve Outcome: Progressing   Problem: Clinical Measurements: Goal: Ability to maintain clinical measurements within normal limits will improve Outcome: Progressing   Problem: Clinical Measurements: Goal: Will remain free from infection Outcome: Progressing   Problem: Clinical Measurements: Goal: Diagnostic test results will improve Outcome: Progressing   Problem: Clinical Measurements: Goal: Respiratory complications will improve Outcome: Progressing   Problem: Clinical Measurements: Goal: Cardiovascular complication will be avoided Outcome: Progressing   Problem: Activity: Goal: Risk for activity intolerance will decrease Outcome: Progressing   Problem: Nutrition: Goal: Adequate nutrition will be maintained Outcome: Progressing   Problem: Coping: Goal: Level of anxiety will decrease Outcome: Progressing   Problem: Elimination: Goal: Will not experience complications related to bowel motility Outcome: Progressing   Problem: Elimination: Goal: Will not experience complications related to urinary retention Outcome: Progressing   Problem: Pain Managment: Goal: General experience of comfort will improve Outcome: Progressing   Problem: Safety: Goal: Ability to remain free from injury will improve Outcome: Progressing   Problem: Skin Integrity: Goal: Risk for impaired skin integrity will decrease Outcome: Progressing   Problem: Education: Goal: Ability to describe self-care measures that may prevent or decrease complications (Diabetes Survival Skills Education) will improve Outcome: Progressing   Problem: Education: Goal: Individualized Educational Video(s) Outcome: Progressing   Problem: Coping: Goal: Ability to adjust to condition or change in health will improve Outcome: Progressing   Problem: Fluid Volume: Goal: Ability to maintain a balanced intake and output will improve Outcome:  Progressing   Problem: Health Behavior/Discharge Planning: Goal: Ability to identify and utilize available resources and services will improve Outcome: Progressing   Problem: Metabolic: Goal: Ability to maintain appropriate glucose levels will improve Outcome: Progressing   Problem: Nutritional: Goal: Maintenance of adequate nutrition will improve Outcome: Progressing   Problem: Nutritional: Goal: Progress toward achieving an optimal weight will improve Outcome: Progressing   Problem: Skin Integrity: Goal: Risk for impaired skin integrity will decrease Outcome: Progressing   Problem: Tissue Perfusion: Goal: Adequacy of tissue  perfusion will improve Outcome: Progressing

## 2023-06-30 NOTE — Progress Notes (Signed)
Echocardiogram 2D Echocardiogram has been performed.  [name] 06/30/2023, 2:55 PM

## 2023-06-30 NOTE — Progress Notes (Signed)
Subjective: Patient had complaints of two episodes of dark stools yesterday. Hgb remains stable and FOBT ordered. He reports having dark stools again today. We will hold his second dose of Eliquis until the FOBT results. Patient had no complaints of abdominal pain, nausea, vomiting, fever, or chills. He endorsed some pain at the site of the drainage incision. Patient was updated about continuing antibiotics and cardiology's surgical risk assessment.  Objective:  Vital signs in last 24 hours: Vitals:   06/29/23 1029 06/29/23 2100 06/30/23 0500 06/30/23 0723  BP:  (!) 96/59 113/65 113/65  Pulse:  79 76 71  Resp:  20 19   Temp:  98.3 F (36.8 C) 97.7 F (36.5 C) 97.7 F (36.5 C)  TempSrc:  Oral Oral Oral  SpO2: 96% 92% 93% 100%  Weight:      Height:       Weight change:   Intake/Output Summary (Last 24 hours) at 06/30/2023 0733 Last data filed at 06/30/2023 0536 Gross per 24 hour  Intake 890.5 ml  Output 240 ml  Net 650.5 ml    Physical Exam Constitutional:      General: He is not in acute distress.    Appearance: He is obese. He is not ill-appearing.  HENT:     Head: Normocephalic.  Cardiovascular:     Rate and Rhythm: Normal rate. Rhythm irregular.  Pulmonary:     Effort: Pulmonary effort is normal.     Breath sounds: Normal breath sounds.  Abdominal:     General: Bowel sounds are normal.     Tenderness: There is no abdominal tenderness.     Comments: Percutaneous drainage in place.  Musculoskeletal:     Right lower leg: No edema.     Left lower leg: No edema.     Comments: Chronic bilateral venous stasis present.  Skin:    General: Skin is warm.  Neurological:     Mental Status: He is alert and oriented to person, place, and time.  Psychiatric:        Mood and Affect: Mood normal.      Assessment/Plan: Principal Problem:  Acute cholecystitis Patient with intermittent epigastric pain associated with meals that radiates to the back. Endorses chills,  nausea, and vomiting. Denies diarrhea. Limited improvement with tums and pepto bismol. Prior appendectomy, no recent abdominal surgeries. Very occasional alcohol use, no history of alcohol use disorder.    WBC 10.2 WNL, lipase 29. CT and RUQ US showing inflammation of the gallbladder wall as well as gallbladder thickening/edema/sludge and gallstones. On physical exam patient with epigastric tenderness and abdomen was noted to be slightly distended. General surgery was consulted in the ED.    Plan:  Patient received Tylenol once for his pain. He remains afebrile with no leukocytosis.  - Surgery recommends HIDA scan and likely percutaneous drainage if positive due to cardiac history. ACS NSQIP surgical risk calculator was 5.1% for serious complications. Results documented in media. - HIDA scan confirmed acute cholecystitis - Patient had percutaneous drainage placed by IR 10/22 - IV Zosyn until cultures from the drainage are resulted -Cardiology consulted to assess patient risk if needing laparoscopic cholecystectomy after drainage. Patient deemed a high risk and would need a stress test prior to surgery. Stress scan likely to be abnormal due to recent test and patient would need a cardiac catheterization prior to surgery if needed. - trend temperature  - monitor daily labs    Afib, rate controlled S/p ICD system extraction 06/01/2023. On Eliquis  5 mg BID and Carvedilol/Coreg 3.125 mg BID. EKG on admission with Afib, HR 81 BPM, Qtc 485 ms, LBBB (stable from 06/14/23 EKG). Asymptomatic on exam. Patient transitioned from heparin gtt for surgical procedure back to home Eliquis after drainage placement. 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 Home medications include carvedilol 3.125 mg BID, furosemide/lasix 40 mg daily, potassium chloride 10 mEq  BID. Sleeps flat and uses only one pillow under his head.    Follows regularly with Dr. Elease Hashimoto, MD [facility]), last visit 06/15/23, 68-month follow up visit with Eligha Bridegroom, NP April 2025). BNP elevated at 455.8. Potassium 4.2 on admission. EKG Afib, stable LBBB. Troponins 26 > 22, peaked. CXR showing cardiac enlargement, negative for effusion or acute disease. Normal respiratory effort on room air and no crackles heard on exam today.  Plan:  - On Telemetry  - Resume home carvedilol 3.125 mg BID  - Will hold home Lasix 40 due to patient being NPO and improvement in lung sounds on exam today. Will continue to monitor. - Strict I/Os - Daily weights  - Monitor daily BMP, replete K as needed to maintain > 4 - F/u magnesium, maintain > 2 and replete as needed -Continuing home Potassium Cl 10 mEq BID     Chronic Conditions:  HLD  Hepatic steatosis Chronic. Last lipid panel 05/26/2022: cholesterol 133, triglycerides 164, HDL 33, VLDL 28, will repeat. Previously on atorvastatin but stopped due to joint pain. RUQ showing hepatic steatosis, will trend LFTs on CMP.  - Resume home Rosuvastatin 1/2 10 mg tablet daily  - Follow up CMP CKD Chronic. Serum Cr 1.13 on admission. Will continue to monitor.  Venous insufficiency Chronic. 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 Patient reports use of compression stockings at home. Recommend keeping skin moisturized and legs elevated.  Hypothyroidism Chronic. TSH 1.120 12/17/2022, will check TSH here. TSH was 0.327. - Continue home levothyroxine 125 mcg  Gout Chronic. No acute gout flare on admission, has been years since the last one.  - Continue home allopurinol 100 mg at bedtime    VTE Prophylaxis: Apixaban 5 BID Diet: Clear liquid Status: Full   LOS: 4 days   Scherrie November, Medical Student 06/30/2023, 7:33 AM

## 2023-06-30 NOTE — Care Management Important Message (Signed)
Important Message  Patient Details  Name: [name] MRN: [id number] Date of Birth: 08-11-1941   Important Message Given:  Yes - Medicare IM     Sherilyn Banker 06/30/2023, 2:50 PM

## 2023-07-01 ENCOUNTER — Other Ambulatory Visit (HOSPITAL_COMMUNITY): Payer: Self-pay

## 2023-07-01 DIAGNOSIS — K810 Acute cholecystitis: Secondary | ICD-10-CM | POA: Diagnosis not present

## 2023-07-01 DIAGNOSIS — I2510 Atherosclerotic heart disease of native coronary artery without angina pectoris: Secondary | ICD-10-CM

## 2023-07-01 DIAGNOSIS — I4819 Other persistent atrial fibrillation: Secondary | ICD-10-CM | POA: Diagnosis not present

## 2023-07-01 DIAGNOSIS — I25810 Atherosclerosis of coronary artery bypass graft(s) without angina pectoris: Secondary | ICD-10-CM | POA: Diagnosis not present

## 2023-07-01 DIAGNOSIS — I5042 Chronic combined systolic (congestive) and diastolic (congestive) heart failure: Secondary | ICD-10-CM | POA: Diagnosis not present

## 2023-07-01 LAB — BASIC METABOLIC PANEL
Anion gap: 9 (ref 5–15)
BUN: 13 mg/dL (ref 8–23)
CO2: 23 mmol/L (ref 22–32)
Calcium: 8.9 mg/dL (ref 8.9–10.3)
Chloride: 104 mmol/L (ref 98–111)
Creatinine, Ser: 1.46 mg/dL — ABNORMAL HIGH (ref 0.61–1.24)
GFR, Estimated: 47 mL/min — ABNORMAL LOW (ref >60)
Glucose, Bld: 252 mg/dL — ABNORMAL HIGH (ref 70–99)
Potassium: 4.5 mmol/L (ref 3.5–5.1)
Sodium: 136 mmol/L (ref 135–145)

## 2023-07-01 LAB — CBC WITH DIFFERENTIAL/PLATELET
Abs Immature Granulocytes: 0.04 10*3/uL (ref 0.00–0.07)
Basophils Absolute: 0 10*3/uL (ref 0.0–0.1)
Basophils Relative: 0 %
Eosinophils Absolute: 0.2 10*3/uL (ref 0.0–0.5)
Eosinophils Relative: 3 %
HCT: 33.2 % — ABNORMAL LOW (ref 39.0–52.0)
Hemoglobin: 10.9 g/dL — ABNORMAL LOW (ref 13.0–17.0)
Immature Granulocytes: 1 %
Lymphocytes Relative: 7 %
Lymphs Abs: 0.6 10*3/uL — ABNORMAL LOW (ref 0.7–4.0)
MCH: 31.7 pg (ref 26.0–34.0)
MCHC: 32.8 g/dL (ref 30.0–36.0)
MCV: 96.5 fL (ref 80.0–100.0)
Monocytes Absolute: 0.7 10*3/uL (ref 0.1–1.0)
Monocytes Relative: 8 %
Neutro Abs: 7 10*3/uL (ref 1.7–7.7)
Neutrophils Relative %: 81 %
Platelets: 269 10*3/uL (ref 150–400)
RBC: 3.44 MIL/uL — ABNORMAL LOW (ref 4.22–5.81)
RDW: 14.4 % (ref 11.5–15.5)
WBC: 8.6 10*3/uL (ref 4.0–10.5)
nRBC: 0 % (ref 0.0–0.2)

## 2023-07-01 LAB — GLUCOSE, CAPILLARY
Glucose-Capillary: 203 mg/dL — ABNORMAL HIGH (ref 70–99)
Glucose-Capillary: 132 mg/dL — ABNORMAL HIGH (ref 70–99)

## 2023-07-01 MED ORDER — AMOXICILLIN-POT CLAVULANATE 875-125 MG PO TABS
1.0000 | ORAL_TABLET | Freq: Two times a day (BID) | ORAL | 0 refills | Status: DC
  Filled 2023-07-01: qty 15, 8d supply, fill #0

## 2023-07-01 MED ORDER — AMOXICILLIN-POT CLAVULANATE 500-125 MG PO TABS
1.0000 | ORAL_TABLET | Freq: Three times a day (TID) | ORAL | Status: DC
  Filled 2023-07-01 (×2): qty 1

## 2023-07-01 NOTE — Plan of Care (Signed)
Pt discharging home with home health, pt daughter unable to come but RN spoke with daughter via phone and educated pt extensively on flushing tube and emptying drainage bag, and discharge instructions, pt having no other questions.

## 2023-07-01 NOTE — Discharge Instructions (Addendum)
Hello Mr. Shellman, you were admitted for acute cholecystitis, which is inflammation of the gallbladder. You were treated with a percutaneous drainage of the gallbladder and IV antibiotics. We will transition you to an oral antibiotic called Augmentin.  Please continue to take you Augmentin until 10/31.  Please keep the following appointments:  An appointment with Cardiology on 07/20/2023 An appointment with General Surgery on 08/12/2023  Please schedule an appointment with your primary care provider in 1-2 weeks.  Please monitor for the following symptoms: bleeding from your incision site, leakage of bile, or dislodging of the drain. If you have any of the following symptoms, please seek medical attention.   It was a pleasure taking care of you!  Scherrie November Internal Medicine

## 2023-07-01 NOTE — TOC Transition Note (Signed)
Transition of Care [facility]) - CM/SW Discharge Note   Patient Details  Name: [name] MRN: [id number] Date of Birth: 05-20-1941  Transition of Care [facility] CM/SW Contact:  Epifanio Lesches, RN Phone Number: 07/01/2023, 11:44 AM   Clinical Narrative:     Patient will DC to: home Anticipated DC date: 07/01/2023 Family notified: yes Transport by: car  - Presents with c/o abd pain,Acute cholecystitis s/p percutaneous cholecystostomy drain placement in IR 06/29/23   Per MD patient ready for DC to . RN, patient, patient's  daughter notified of DC. Pt agreeable to home health services. Pt without provider preference.Referral made with [facility]  and accepted, start of care 10/25.[name] Pt without DME needs. Post hospital f/u noted on AVS. Pt without RX med concerns. Daughter to provide transportation   RNCM will sign off for now as intervention is no longer needed. Please consult [place] again if new needs arise.     Final next level of care: Home w Home Health Services Barriers to Discharge: No Barriers Identified   Patient Goals and CMS Choice   Choice offered to / list presented to : Patient, Adult Children  Discharge Placement                         Discharge Plan and Services Additional resources added to the After Visit Summary for                            [facility] Arranged: RN, PT, Social Work [facility] Agency: [facility] Date [facility] Agency Contacted: 07/01/23 Time HH Agency Contacted: 1127 Representative spoke with at [facility] Agency: Tresa Endo  Social Determinants of Health (SDOH) Interventions SDOH Screenings   Food Insecurity: No Food Insecurity (06/26/2023)  Housing: Low Risk  (06/26/2023)  Transportation Needs: No Transportation Needs (06/26/2023)  Utilities: Not At Risk (06/26/2023)  Depression (PHQ2-9): Low Risk  (10/14/2022)  Tobacco Use: Low Risk  (06/26/2023)     Readmission Risk Interventions     No data to display

## 2023-07-01 NOTE — Evaluation (Signed)
Physical Therapy Evaluation Patient Details Name: LEUL LUTY MRN: [id number] DOB: 08-Dec-1940 Today's Date: 07/01/2023  History of Present Illness  Patient is an 82 y/o male admitted 06/26/23 with abdominal pain N&V following study performed at [facility] for investigating cause of loose stools.  Found to have cholelithiasis and acute cholecystitis now s/p cholecystostomy drain placed 06/29/23.  PMH positive for CHF, A-fib on [facility] s/p ICD and removal on 9/24, CKD, HTN, arthritis, BPH, dyslipidemia, gout, ischemic cardiomyopathy, v-tach.  Clinical Impression  Patient presents with mobility close to baseline, though with some gait abnormality due to anterior bias and shorter steps.  Able to stop without LOB and demonstrates picking up item from floor with DGI Score 20/24.  Feel appropriate for home with initial HHPT for safety eval.  Patient hopeful for d/c today.  No further skilled PT in the acute setting, will sign off.        If plan is discharge home, recommend the following:     Can travel by private vehicle        Equipment Recommendations None recommended by PT  Recommendations for Other Services       Functional Status Assessment Patient has had a recent decline in their functional status and demonstrates the ability to make significant improvements in function in a reasonable and predictable amount of time.     Precautions / Restrictions Precautions Precautions: Fall Precaution Comments: perc chole drain      Mobility  Bed Mobility Overal bed mobility: Modified Independent                  Transfers Overall transfer level: Modified independent Equipment used: None                    Ambulation/Gait Ambulation/Gait assistance: Supervision Gait Distance (Feet): 150 Feet (x 2) Assistive device: None Gait Pattern/deviations: Step-through pattern, Decreased stride length, Shuffle       General Gait Details: decreased stride length and slight anterior bias  with at times increased speed esp with looking down. No LOB and able to slow speed on command without LOB, see DGI  Stairs Stairs: Yes Stairs assistance: Modified independent (Device/Increase time) Stair Management: Two rails, Alternating pattern, Forwards Number of Stairs: 2 General stair comments: reports has a wall to hold for stairs at entrance at home  Wheelchair Mobility     Tilt Bed    Modified Rankin (Stroke Patients Only)       Balance Overall balance assessment: Needs assistance   Sitting balance-Leahy Scale: Good       Standing balance-Leahy Scale: Good                   Standardized Balance Assessment Standardized Balance Assessment : Dynamic Gait Index   Dynamic Gait Index Level Surface: Mild Impairment Change in Gait Speed: Mild Impairment Gait with Horizontal Head Turns: Normal Gait with Vertical Head Turns: Mild Impairment Gait and Pivot Turn: Normal Step Over Obstacle: Normal Step Around Obstacles: Normal Steps: Mild Impairment Total Score: 20       Pertinent Vitals/Pain Pain Assessment Pain Assessment: 0-10 Pain Score: 1  Pain Location: R side in general Pain Descriptors / Indicators: Tender Pain Intervention(s): Monitored during session    Home Living Family/patient expects to be discharged to:: Private residence Living Arrangements: Alone Available Help at Discharge: Family;Available PRN/intermittently (daughter lives next door) Type of Home: House Home Access: Stairs to enter Entrance Stairs-Rails: None Entrance Stairs-Number of Steps: 2   Home  Layout: Able to live on main level with bedroom/bathroom;Laundry or work area in [employer] Equipment: Agricultural consultant (2 wheels);Cane - single point;Shower seat;Grab bars - tub/shower;Hand held shower head;Grab bars - toilet      Prior Function Prior Level of Function : Independent/Modified Independent;Driving             Mobility Comments: maybe one fall in 6 months, uses  cane or walker when needed, but not all the time ADLs Comments: cooks, cleans     Extremity/Trunk Assessment   Upper Extremity Assessment Upper Extremity Assessment: Overall WFL for tasks assessed    Lower Extremity Assessment Lower Extremity Assessment: Overall WFL for tasks assessed       Communication   Communication Communication: No apparent difficulties  Cognition Arousal: Alert Behavior During Therapy: WFL for tasks assessed/performed Overall Cognitive Status: Within Functional Limits for tasks assessed                                          General Comments      Exercises     Assessment/Plan    PT Assessment All further PT needs can be met in the next venue of care  PT Problem List         PT Treatment Interventions      PT Goals (Current goals can be found in the Care Plan section)  Acute Rehab PT Goals PT Goal Formulation: All assessment and education complete, DC therapy    Frequency       Co-evaluation               AM-PAC PT "6 Clicks" Mobility  Outcome Measure Help needed turning from your back to your side while in a flat bed without using bedrails?: None Help needed moving from lying on your back to sitting on the side of a flat bed without using bedrails?: None Help needed moving to and from a bed to a chair (including a wheelchair)?: None Help needed standing up from a chair using your arms (e.g., wheelchair or bedside chair)?: None Help needed to walk in hospital room?: None Help needed climbing 3-5 steps with a railing? : None 6 Click Score: 24    End of Session Equipment Utilized During Treatment: Gait belt Activity Tolerance: Patient tolerated treatment well Patient left: in bed   PT Visit Diagnosis: Other abnormalities of gait and mobility (R26.89)    Time: 1335-1400 PT Time Calculation (min) (ACUTE ONLY): 25 min   Charges:   PT Evaluation $PT Eval Low Complexity: 1 Low PT Treatments $Gait  Training: 8-22 mins PT General Charges $$ ACUTE PT VISIT: 1 Visit         Sheran Lawless, PT Acute Rehabilitation Services Office:[phone] 07/01/2023   Elray Mcgregor 07/01/2023, 2:26 PM

## 2023-07-01 NOTE — Evaluation (Addendum)
Occupational Therapy Evaluation Patient Details Name: [name] MRN: [id number] DOB: May 17, 1941 Today's Date: 07/01/2023   History of Present Illness Patient is an 82 y/o male admitted 06/26/23 with abdominal pain N&V following study performed at [facility] for investigating cause of loose stools.  Found to have cholelithiasis and acute cholecystitis now s/p cholecystostomy drain placed 06/29/23.  PMH positive for CHF, A-fib on [facility] s/p ICD and removal on 9/24, CKD, HTN, arthritis, BPH, dyslipidemia, gout, ischemic cardiomyopathy, v-tach.   Clinical Impression   Pt in good spirits, eager to return home, able to fully participate. Pt lives alone, daughter is next door, assists with meals 2-3X a week, Pt independent without AD at baseline. Pt currently close to or at baseline, able to complete all activities mod I, did not use AD, able to ambulate ~300 feet. Pt instructed on use of sock aide due to moderate effort to bed down to don/doff socks, but states he would rather complete task without AD. Pt has no acute OT needs, no OT follow up needed. Pt states he has all DME needed to remain safe at home.        If plan is discharge home, recommend the following: Assist for transportation    Functional Status Assessment  Patient has had a recent decline in their functional status and demonstrates the ability to make significant improvements in function in a reasonable and predictable amount of time.  Equipment Recommendations  None recommended by OT    Recommendations for Other Services       Precautions / Restrictions Precautions Precautions: Fall Precaution Comments: perc chole drain Restrictions Weight Bearing Restrictions: No      Mobility Bed Mobility Overal bed mobility: Modified Independent                  Transfers Overall transfer level: Modified independent Equipment used: None                      Balance     Sitting balance-Leahy Scale: Good        Standing balance-Leahy Scale: Good                             ADL either performed or assessed with clinical judgement   ADL Overall ADL's : At baseline;Modified independent                                       General ADL Comments: mod I, good safety awareness     Vision Baseline Vision/History: 1 Wears glasses Ability to See in Adequate Light: 0 Adequate Patient Visual Report: No change from baseline       Perception         Praxis         Pertinent Vitals/Pain Pain Assessment Pain Assessment: 0-10 Pain Score: 2  Pain Location: R abdomen/surgical site when coughs Pain Descriptors / Indicators: Tender Pain Intervention(s): Monitored during session     Extremity/Trunk Assessment Upper Extremity Assessment Upper Extremity Assessment: Overall WFL for tasks assessed   Lower Extremity Assessment Lower Extremity Assessment: Overall WFL for tasks assessed       Communication Communication Communication: No apparent difficulties   Cognition Arousal: Alert Behavior During Therapy: WFL for tasks assessed/performed Overall Cognitive Status: Within Functional Limits for tasks assessed  General Comments       Exercises     Shoulder Instructions      Home Living Family/patient expects to be discharged to:: Private residence Living Arrangements: Alone Available Help at Discharge: Family;Available PRN/intermittently Type of Home: House Home Access: Stairs to enter Entrance Stairs-Number of Steps: 2 Entrance Stairs-Rails: None Home Layout: Able to live on main level with bedroom/bathroom;Laundry or work area in basement     [employer] Shower/Tub: Producer, television/film/video: Standard     Home Equipment: Agricultural consultant (2 wheels);Cane - single point;Shower seat;Grab bars - tub/shower;Hand held shower head;Grab bars - toilet   Additional Comments: Pt lives alone, daughter  lives next door and assists with some meals 2-3 times a week      Prior Functioning/Environment Prior Level of Function : Independent/Modified Independent;Driving             Mobility Comments: reports 1 fall outside, walks without AD typically ADLs Comments: cooks, cleans        OT Problem List: Pain      OT Treatment/Interventions:      OT Goals(Current goals can be found in the care plan section) Acute Rehab OT Goals Patient Stated Goal: to return home OT Goal Formulation: With patient Time For Goal Achievement: 07/15/23 Potential to Achieve Goals: Good  OT Frequency:      Co-evaluation              AM-PAC OT "6 Clicks" Daily Activity     Outcome Measure Help from another person eating meals?: None Help from another person taking care of personal grooming?: None Help from another person toileting, which includes using toliet, bedpan, or urinal?: None Help from another person bathing (including washing, rinsing, drying)?: None Help from another person to put on and taking off regular upper body clothing?: None Help from another person to put on and taking off regular lower body clothing?: None 6 Click Score: 24   End of Session Equipment Utilized During Treatment: Gait belt Nurse Communication: Mobility status  Activity Tolerance: Patient tolerated treatment well Patient left: in chair;with call bell/phone within reach  OT Visit Diagnosis: Pain Pain - Right/Left: Right Pain - part of body:  (abdomen)                Time: 4098-1191 OT Time Calculation (min): 26 min Charges:  OT General Charges $OT Visit: 1 Visit OT Evaluation $OT Eval Low Complexity: 1 Low OT Treatments $Self Care/Home Management : 8-22 mins  [place], OTR/L   Ercel Pepitone R Carla Rashad 07/01/2023, 3:15 PM

## 2023-07-01 NOTE — Discharge Summary (Signed)
Name: [name] MRN: [id number] DOB: [birth year] 82 y.o. 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 Hypertension Hyperlipidemia Hepatic Steatosis History of Chronic Kidney Disease Venous Insufficiency Hypothyroidism Gout     Discharge Medications: Allergies as of 07/01/2023       Reactions   Lipitor [atorvastatin] Other (See Comments)   MUSCLE ACHE    Jardiance [empagliflozin] Other (See Comments)   Intolerance        Medication List     STOP taking these medications    furosemide 40 MG tablet Commonly known as: LASIX   linezolid 600 MG tablet Commonly known as: ZYVOX   potassium chloride 10 MEQ tablet Commonly known as: KLOR-CON       TAKE these medications    acetaminophen 325 MG tablet Commonly known as: TYLENOL Take 2 tablets (650 mg total) by mouth every 4 (four) hours as needed for headache or mild pain.   allopurinol 100 MG tablet Commonly known as: ZYLOPRIM Take 100 mg by mouth at bedtime.   amoxicillin-clavulanate 875-125 MG tablet Commonly known as: AUGMENTIN Take 1 tablet by mouth 2 (two) times daily for 15 doses.   carvedilol 3.125 MG tablet Commonly known as: COREG TAKE 1 TABLET TWICE DAILY   Eliquis 5 MG Tabs tablet Generic drug: apixaban Take 5 mg by mouth 2 (two) times daily.   latanoprost 0.005 % ophthalmic solution Commonly known as: XALATAN Place 1 drop into both eyes at bedtime.   levothyroxine 125 MCG tablet Commonly known as: SYNTHROID Take 125 mcg by mouth daily before breakfast.   nitroGLYCERIN 0.4 MG SL tablet Commonly known as: NITROSTAT Place 1 tablet (0.4 mg total) under the tongue every 5 (five) minutes as needed for chest pain.   Polyethyl Glycol-Propyl Glycol  0.4-0.3 % Soln Place 1 drop into both eyes daily.   rosuvastatin 10 MG tablet Commonly known as: CRESTOR TAKE 1/2 TABLET EVERY DAY   sodium chloride flush 0.9 % Soln Commonly known as: NS Flush gallbladder drain once daily with at least 5 ml normal saline. Discard extra 5ml after use of the 10ml syringe               Durable Medical Equipment  (From admission, onward)           Start     Ordered   07/01/23 1451  DME Walker  Once       Question Answer Comment  Walker: With 5 Inch Wheels   Patient needs a walker to treat with the following condition Physical deconditioning      07/01/23 1451              Discharge Care Instructions  (From admission, onward)           Start     Ordered   07/01/23 0000  Discharge wound care:       Comments: Care per IR team teaching.   07/01/23 1451             Disposition and follow-up:   [name] was discharged from [facility] in Stable condition.  At the hospital follow up visit please address:  1.  His percutaneous gall bladder drainage and need for laparoscopic cholecystectomy in the future. IR recommendation: Recommend fluoroscopy with  Name: [name] MRN: [id number] DOB: [birth year] 82 y.o. 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 Hypertension Hyperlipidemia Hepatic Steatosis History of Chronic Kidney Disease Venous Insufficiency Hypothyroidism Gout     Discharge Medications: Allergies as of 07/01/2023       Reactions   Lipitor [atorvastatin] Other (See Comments)   MUSCLE ACHE    Jardiance [empagliflozin] Other (See Comments)   Intolerance        Medication List     STOP taking these medications    furosemide 40 MG tablet Commonly known as: LASIX   linezolid 600 MG tablet Commonly known as: ZYVOX   potassium chloride 10 MEQ tablet Commonly known as: KLOR-CON       TAKE these medications    acetaminophen 325 MG tablet Commonly known as: TYLENOL Take 2 tablets (650 mg total) by mouth every 4 (four) hours as needed for headache or mild pain.   allopurinol 100 MG tablet Commonly known as: ZYLOPRIM Take 100 mg by mouth at bedtime.   amoxicillin-clavulanate 875-125 MG tablet Commonly known as: AUGMENTIN Take 1 tablet by mouth 2 (two) times daily for 15 doses.   carvedilol 3.125 MG tablet Commonly known as: COREG TAKE 1 TABLET TWICE DAILY   Eliquis 5 MG Tabs tablet Generic drug: apixaban Take 5 mg by mouth 2 (two) times daily.   latanoprost 0.005 % ophthalmic solution Commonly known as: XALATAN Place 1 drop into both eyes at bedtime.   levothyroxine 125 MCG tablet Commonly known as: SYNTHROID Take 125 mcg by mouth daily before breakfast.   nitroGLYCERIN 0.4 MG SL tablet Commonly known as: NITROSTAT Place 1 tablet (0.4 mg total) under the tongue every 5 (five) minutes as needed for chest pain.   Polyethyl Glycol-Propyl Glycol  0.4-0.3 % Soln Place 1 drop into both eyes daily.   rosuvastatin 10 MG tablet Commonly known as: CRESTOR TAKE 1/2 TABLET EVERY DAY   sodium chloride flush 0.9 % Soln Commonly known as: NS Flush gallbladder drain once daily with at least 5 ml normal saline. Discard extra 5ml after use of the 10ml syringe               Durable Medical Equipment  (From admission, onward)           Start     Ordered   07/01/23 1451  DME Walker  Once       Question Answer Comment  Walker: With 5 Inch Wheels   Patient needs a walker to treat with the following condition Physical deconditioning      07/01/23 1451              Discharge Care Instructions  (From admission, onward)           Start     Ordered   07/01/23 0000  Discharge wound care:       Comments: Care per IR team teaching.   07/01/23 1451             Disposition and follow-up:   [name] was discharged from [facility] in Stable condition.  At the hospital follow up visit please address:  1.  His percutaneous gall bladder drainage and need for laparoscopic cholecystectomy in the future. IR recommendation: Recommend fluoroscopy with  General: Abdomen is flat. Bowel sounds are normal.     Comments: Percutaneous drainage in place.   Genitourinary:    Rectum: Guaiac result negative.  Skin:    General: Skin is warm.     Comments: Bilateral chronic venous stasis present.  Neurological:     Mental Status: He is alert and oriented to person, place, and time.      Pertinent Labs, Studies, and Procedures:     Latest Ref Rng & Units 07/01/2023    8:03 AM 06/30/2023    5:36 AM 06/28/2023    8:28 AM  CBC  WBC 4.0 - 10.5 K/uL 8.6  6.1  6.0   Hemoglobin 13.0 - 17.0 g/dL 86.5  78.4  69.6   Hematocrit 39.0 - 52.0 % 33.2  33.6  33.6   Platelets 150 - 400 K/uL 269  265  255        Latest Ref Rng & Units 07/01/2023    8:04 AM 06/30/2023    5:36 AM 06/28/2023    8:28 AM  CMP  Glucose 70 - 99 mg/dL 295  95  284   BUN 8 - 23 mg/dL 13  13  16    Creatinine 0.61 - 1.24 mg/dL 1.32  4.40  1.02   Sodium 135 - 145 mmol/L 136  135  137   Potassium 3.5 - 5.1 mmol/L 4.5  4.0  4.0   Chloride 98 - 111 mmol/L 104  102  104   CO2 22 - 32 mmol/L 23  24  24    Calcium 8.9 - 10.3 mg/dL 8.9  8.6  8.7   Total Protein 6.5 - 8.1 g/dL  6.2    Total Bilirubin 0.3 - 1.2 mg/dL  1.2    Alkaline Phos 38 - 126 U/L  80    AST 15 - 41 U/L  21    ALT 0 - 44 U/L  16      ECHOCARDIOGRAM COMPLETE  Result Date: 06/30/2023    ECHOCARDIOGRAM REPORT   Patient Name:   DRUMMOND HANKES Coffie Date of Exam: 06/30/2023 Medical Rec #:  [id number]      Height:       70.0 in Accession #:    [id number]      Weight:       227.1 lb Date of Birth:  [birth year]      BSA:          2.203 m Patient Age:    82 years       BP:           113/65 mmHg Patient Gender: M              HR:           69 bpm. Exam Location:  Inpatient Procedure: 2D Echo, Cardiac Doppler, Color Doppler and Intracardiac            Opacification Agent Indications:    CHF  History:        Patient has prior history of Echocardiogram examinations, most                 recent 11/23/2019. CHF, Aortic Valve Disease; Risk                 Factors:Hypertension and Diabetes.  Sonographer:    Karma Ganja Referring Phys: [id number] CHRISTOPHER L SCHUMANN  Sonographer Comments: Technically difficult study due to poor echo windows and patient is obese. Image acquisition challenging due to patient body habitus.  General: Abdomen is flat. Bowel sounds are normal.     Comments: Percutaneous drainage in place.   Genitourinary:    Rectum: Guaiac result negative.  Skin:    General: Skin is warm.     Comments: Bilateral chronic venous stasis present.  Neurological:     Mental Status: He is alert and oriented to person, place, and time.      Pertinent Labs, Studies, and Procedures:     Latest Ref Rng & Units 07/01/2023    8:03 AM 06/30/2023    5:36 AM 06/28/2023    8:28 AM  CBC  WBC 4.0 - 10.5 K/uL 8.6  6.1  6.0   Hemoglobin 13.0 - 17.0 g/dL 86.5  78.4  69.6   Hematocrit 39.0 - 52.0 % 33.2  33.6  33.6   Platelets 150 - 400 K/uL 269  265  255        Latest Ref Rng & Units 07/01/2023    8:04 AM 06/30/2023    5:36 AM 06/28/2023    8:28 AM  CMP  Glucose 70 - 99 mg/dL 295  95  284   BUN 8 - 23 mg/dL 13  13  16    Creatinine 0.61 - 1.24 mg/dL 1.32  4.40  1.02   Sodium 135 - 145 mmol/L 136  135  137   Potassium 3.5 - 5.1 mmol/L 4.5  4.0  4.0   Chloride 98 - 111 mmol/L 104  102  104   CO2 22 - 32 mmol/L 23  24  24    Calcium 8.9 - 10.3 mg/dL 8.9  8.6  8.7   Total Protein 6.5 - 8.1 g/dL  6.2    Total Bilirubin 0.3 - 1.2 mg/dL  1.2    Alkaline Phos 38 - 126 U/L  80    AST 15 - 41 U/L  21    ALT 0 - 44 U/L  16      ECHOCARDIOGRAM COMPLETE  Result Date: 06/30/2023    ECHOCARDIOGRAM REPORT   Patient Name:   DRUMMOND HANKES Coffie Date of Exam: 06/30/2023 Medical Rec #:  [id number]      Height:       70.0 in Accession #:    [id number]      Weight:       227.1 lb Date of Birth:  [birth year]      BSA:          2.203 m Patient Age:    82 years       BP:           113/65 mmHg Patient Gender: M              HR:           69 bpm. Exam Location:  Inpatient Procedure: 2D Echo, Cardiac Doppler, Color Doppler and Intracardiac            Opacification Agent Indications:    CHF  History:        Patient has prior history of Echocardiogram examinations, most                 recent 11/23/2019. CHF, Aortic Valve Disease; Risk                 Factors:Hypertension and Diabetes.  Sonographer:    Karma Ganja Referring Phys: [id number] CHRISTOPHER L SCHUMANN  Sonographer Comments: Technically difficult study due to poor echo windows and patient is obese. Image acquisition challenging due to patient body habitus.  General: Abdomen is flat. Bowel sounds are normal.     Comments: Percutaneous drainage in place.   Genitourinary:    Rectum: Guaiac result negative.  Skin:    General: Skin is warm.     Comments: Bilateral chronic venous stasis present.  Neurological:     Mental Status: He is alert and oriented to person, place, and time.      Pertinent Labs, Studies, and Procedures:     Latest Ref Rng & Units 07/01/2023    8:03 AM 06/30/2023    5:36 AM 06/28/2023    8:28 AM  CBC  WBC 4.0 - 10.5 K/uL 8.6  6.1  6.0   Hemoglobin 13.0 - 17.0 g/dL 86.5  78.4  69.6   Hematocrit 39.0 - 52.0 % 33.2  33.6  33.6   Platelets 150 - 400 K/uL 269  265  255        Latest Ref Rng & Units 07/01/2023    8:04 AM 06/30/2023    5:36 AM 06/28/2023    8:28 AM  CMP  Glucose 70 - 99 mg/dL 295  95  284   BUN 8 - 23 mg/dL 13  13  16    Creatinine 0.61 - 1.24 mg/dL 1.32  4.40  1.02   Sodium 135 - 145 mmol/L 136  135  137   Potassium 3.5 - 5.1 mmol/L 4.5  4.0  4.0   Chloride 98 - 111 mmol/L 104  102  104   CO2 22 - 32 mmol/L 23  24  24    Calcium 8.9 - 10.3 mg/dL 8.9  8.6  8.7   Total Protein 6.5 - 8.1 g/dL  6.2    Total Bilirubin 0.3 - 1.2 mg/dL  1.2    Alkaline Phos 38 - 126 U/L  80    AST 15 - 41 U/L  21    ALT 0 - 44 U/L  16      ECHOCARDIOGRAM COMPLETE  Result Date: 06/30/2023    ECHOCARDIOGRAM REPORT   Patient Name:   DRUMMOND HANKES Coffie Date of Exam: 06/30/2023 Medical Rec #:  [id number]      Height:       70.0 in Accession #:    [id number]      Weight:       227.1 lb Date of Birth:  [birth year]      BSA:          2.203 m Patient Age:    82 years       BP:           113/65 mmHg Patient Gender: M              HR:           69 bpm. Exam Location:  Inpatient Procedure: 2D Echo, Cardiac Doppler, Color Doppler and Intracardiac            Opacification Agent Indications:    CHF  History:        Patient has prior history of Echocardiogram examinations, most                 recent 11/23/2019. CHF, Aortic Valve Disease; Risk                 Factors:Hypertension and Diabetes.  Sonographer:    Karma Ganja Referring Phys: [id number] CHRISTOPHER L SCHUMANN  Sonographer Comments: Technically difficult study due to poor echo windows and patient is obese. Image acquisition challenging due to patient body habitus.  Name: [name] MRN: [id number] DOB: [birth year] 82 y.o. 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 Hypertension Hyperlipidemia Hepatic Steatosis History of Chronic Kidney Disease Venous Insufficiency Hypothyroidism Gout     Discharge Medications: Allergies as of 07/01/2023       Reactions   Lipitor [atorvastatin] Other (See Comments)   MUSCLE ACHE    Jardiance [empagliflozin] Other (See Comments)   Intolerance        Medication List     STOP taking these medications    furosemide 40 MG tablet Commonly known as: LASIX   linezolid 600 MG tablet Commonly known as: ZYVOX   potassium chloride 10 MEQ tablet Commonly known as: KLOR-CON       TAKE these medications    acetaminophen 325 MG tablet Commonly known as: TYLENOL Take 2 tablets (650 mg total) by mouth every 4 (four) hours as needed for headache or mild pain.   allopurinol 100 MG tablet Commonly known as: ZYLOPRIM Take 100 mg by mouth at bedtime.   amoxicillin-clavulanate 875-125 MG tablet Commonly known as: AUGMENTIN Take 1 tablet by mouth 2 (two) times daily for 15 doses.   carvedilol 3.125 MG tablet Commonly known as: COREG TAKE 1 TABLET TWICE DAILY   Eliquis 5 MG Tabs tablet Generic drug: apixaban Take 5 mg by mouth 2 (two) times daily.   latanoprost 0.005 % ophthalmic solution Commonly known as: XALATAN Place 1 drop into both eyes at bedtime.   levothyroxine 125 MCG tablet Commonly known as: SYNTHROID Take 125 mcg by mouth daily before breakfast.   nitroGLYCERIN 0.4 MG SL tablet Commonly known as: NITROSTAT Place 1 tablet (0.4 mg total) under the tongue every 5 (five) minutes as needed for chest pain.   Polyethyl Glycol-Propyl Glycol  0.4-0.3 % Soln Place 1 drop into both eyes daily.   rosuvastatin 10 MG tablet Commonly known as: CRESTOR TAKE 1/2 TABLET EVERY DAY   sodium chloride flush 0.9 % Soln Commonly known as: NS Flush gallbladder drain once daily with at least 5 ml normal saline. Discard extra 5ml after use of the 10ml syringe               Durable Medical Equipment  (From admission, onward)           Start     Ordered   07/01/23 1451  DME Walker  Once       Question Answer Comment  Walker: With 5 Inch Wheels   Patient needs a walker to treat with the following condition Physical deconditioning      07/01/23 1451              Discharge Care Instructions  (From admission, onward)           Start     Ordered   07/01/23 0000  Discharge wound care:       Comments: Care per IR team teaching.   07/01/23 1451             Disposition and follow-up:   [name] was discharged from [facility] in Stable condition.  At the hospital follow up visit please address:  1.  His percutaneous gall bladder drainage and need for laparoscopic cholecystectomy in the future. IR recommendation: Recommend fluoroscopy with  General: Abdomen is flat. Bowel sounds are normal.     Comments: Percutaneous drainage in place.   Genitourinary:    Rectum: Guaiac result negative.  Skin:    General: Skin is warm.     Comments: Bilateral chronic venous stasis present.  Neurological:     Mental Status: He is alert and oriented to person, place, and time.      Pertinent Labs, Studies, and Procedures:     Latest Ref Rng & Units 07/01/2023    8:03 AM 06/30/2023    5:36 AM 06/28/2023    8:28 AM  CBC  WBC 4.0 - 10.5 K/uL 8.6  6.1  6.0   Hemoglobin 13.0 - 17.0 g/dL 86.5  78.4  69.6   Hematocrit 39.0 - 52.0 % 33.2  33.6  33.6   Platelets 150 - 400 K/uL 269  265  255        Latest Ref Rng & Units 07/01/2023    8:04 AM 06/30/2023    5:36 AM 06/28/2023    8:28 AM  CMP  Glucose 70 - 99 mg/dL 295  95  284   BUN 8 - 23 mg/dL 13  13  16    Creatinine 0.61 - 1.24 mg/dL 1.32  4.40  1.02   Sodium 135 - 145 mmol/L 136  135  137   Potassium 3.5 - 5.1 mmol/L 4.5  4.0  4.0   Chloride 98 - 111 mmol/L 104  102  104   CO2 22 - 32 mmol/L 23  24  24    Calcium 8.9 - 10.3 mg/dL 8.9  8.6  8.7   Total Protein 6.5 - 8.1 g/dL  6.2    Total Bilirubin 0.3 - 1.2 mg/dL  1.2    Alkaline Phos 38 - 126 U/L  80    AST 15 - 41 U/L  21    ALT 0 - 44 U/L  16      ECHOCARDIOGRAM COMPLETE  Result Date: 06/30/2023    ECHOCARDIOGRAM REPORT   Patient Name:   DRUMMOND HANKES Coffie Date of Exam: 06/30/2023 Medical Rec #:  [id number]      Height:       70.0 in Accession #:    [id number]      Weight:       227.1 lb Date of Birth:  [birth year]      BSA:          2.203 m Patient Age:    82 years       BP:           113/65 mmHg Patient Gender: M              HR:           69 bpm. Exam Location:  Inpatient Procedure: 2D Echo, Cardiac Doppler, Color Doppler and Intracardiac            Opacification Agent Indications:    CHF  History:        Patient has prior history of Echocardiogram examinations, most                 recent 11/23/2019. CHF, Aortic Valve Disease; Risk                 Factors:Hypertension and Diabetes.  Sonographer:    Karma Ganja Referring Phys: [id number] CHRISTOPHER L SCHUMANN  Sonographer Comments: Technically difficult study due to poor echo windows and patient is obese. Image acquisition challenging due to patient body habitus.

## 2023-07-01 NOTE — Progress Notes (Signed)
Rounding Note    Patient Name: [name] Date of Encounter: 07/01/2023  [facility] Cardiologist: Kristeen Miss, MD   Subjective   Denies any chest pain or dyspnea  Inpatient Medications    Scheduled Meds:  acetaminophen  1,000 mg Oral Q8H   allopurinol  100 mg Oral QHS   amoxicillin-clavulanate  1 tablet Oral TID   apixaban  5 mg Oral BID   carvedilol  3.125 mg Oral BID WC   heparin  4,700 Units Intravenous Once   insulin aspart  0-9 Units Subcutaneous TID WC   latanoprost  1 drop Both Eyes QHS   ondansetron (ZOFRAN) IV  4 mg Intravenous Once   potassium chloride  10 mEq Oral Daily   rosuvastatin  5 mg Oral Daily   sodium chloride flush  5 mL Intracatheter Q8H   Continuous Infusions:   PRN Meds: HYDROmorphone, lip balm, technetium TC 35M mebrofenin   Vital Signs    Vitals:   06/30/23 1452 06/30/23 2100 07/01/23 0459 07/01/23 0738  BP: 118/69 118/74 (!) 134/119 (!) 101/59  Pulse: 68 76  89  Resp:    18  Temp: 97.7 F (36.5 C) 98.2 F (36.8 C) 97.7 F (36.5 C) 98.6 F (37 C)  TempSrc: Oral Oral Oral Oral  SpO2: 94% 94% 98% 94%  Weight:      Height:        Intake/Output Summary (Last 24 hours) at 07/01/2023 1033 Last data filed at 07/01/2023 0814 Gross per 24 hour  Intake 355 ml  Output 200 ml  Net 155 ml      06/29/2023    5:00 AM 06/28/2023    4:41 AM 06/27/2023    5:00 AM  Last 3 Weights  Weight (lbs) 227 lb 1.2 oz 222 lb 10.6 oz 222 lb 0.1 oz  Weight (kg) 103 kg 101 kg 100.7 kg      Telemetry    Atrial fibrillation with controlled rates- Personally Reviewed  ECG    No new ECG- Personally Reviewed  Physical Exam   GEN: No acute distress.   Neck: No JVD Cardiac: RRR, no murmurs, rubs, or gallops.  Respiratory: Clear to auscultation bilaterally. GI: Soft, nontender, non-distended  MS: No edema; No deformity. Neuro:  Nonfocal  Psych: Normal affect   Labs    High Sensitivity Troponin:   Recent Labs  Lab  06/26/23 0626 06/26/23 0857  TROPONINIHS 26* 22*     Chemistry Recent Labs  Lab 06/26/23 0857 06/27/23 0919 06/28/23 0828 06/30/23 0536 07/01/23 0804  NA  --  135 137 135 136  K  --  3.9 4.0 4.0 4.5  CL  --  101 104 102 104  CO2  --  25 24 24 23   GLUCOSE  --  235* 109* 95 252*  BUN  --  16 16 13 13   CREATININE  --  1.34* 1.19 1.43* 1.46*  CALCIUM  --  8.6* 8.7* 8.6* 8.9  MG  --  1.9  --   --   --   PROT 6.0* 6.0*  --  6.2*  --   ALBUMIN 3.0* 2.9*  --  3.0*  --   AST 23 22  --  21  --   ALT 18 16  --  16  --   ALKPHOS 76 87  --  80  --   BILITOT 0.9 1.1  --  1.2  --   GFRNONAA  --  53* >60 49* 47*  ANIONGAP  --  9 9 9 9     Lipids  Recent Labs  Lab 06/27/23 0919  CHOL 82  TRIG 84  HDL 25*  LDLCALC 40  CHOLHDL 3.3    Hematology Recent Labs  Lab 06/28/23 0828 06/30/23 0536 07/01/23 0803  WBC 6.0 6.1 8.6  RBC 3.51* 3.48* 3.44*  HGB 10.7* 11.0* 10.9*  HCT 33.6* 33.6* 33.2*  MCV 95.7 96.6 96.5  MCH 30.5 31.6 31.7  MCHC 31.8 32.7 32.8  RDW 14.0 14.2 14.4  PLT 255 265 269   Thyroid  Recent Labs  Lab 06/28/23 0828  TSH 0.327*    BNP Recent Labs  Lab 06/26/23 0626  BNP 455.8*    DDimer No results for input(s): "DDIMER" in the last 168 hours.   Radiology    ECHOCARDIOGRAM COMPLETE  Result Date: 06/30/2023    ECHOCARDIOGRAM REPORT   Patient Name:   [name] Lucy Date of Exam: 06/30/2023 Medical Rec #:  [id number]      Height:       70.0 in Accession #:    [id number]     Weight:       227.1 lb Date of Birth:  20-Jul-1941      BSA:          2.203 m Patient Age:    82 years       BP:           113/65 mmHg Patient Gender: M              HR:           69 bpm. Exam Location:  Inpatient Procedure: 2D Echo, Cardiac Doppler, Color Doppler and Intracardiac            Opacification Agent Indications:    CHF  History:        Patient has prior history of Echocardiogram examinations, most                 recent 11/23/2019. CHF, Aortic Valve Disease; Risk                  Factors:Hypertension and Diabetes.  Sonographer:    Karma Ganja Referring Phys: [id number] Yulianna Folse L Neev Mcmains  Sonographer Comments: Technically difficult study due to poor echo windows and patient is obese. Image acquisition challenging due to patient body habitus. IMPRESSIONS  1. Left ventricular ejection fraction, by estimation, is 25 to 30%. The left ventricle has severely decreased function. The left ventricle demonstrates regional wall motion abnormalities (see scoring diagram/findings for description). Left ventricular diastolic function could not be evaluated.  2. Right ventricular systolic function is moderately reduced. The right ventricular size is normal. There is moderately elevated pulmonary artery systolic pressure.  3. Left atrial size was severely dilated.  4. Right atrial size was moderately dilated.  5. The mitral valve is normal in structure. Trivial mitral valve regurgitation. No evidence of mitral stenosis.  6. The aortic valve is normal in structure. There is mild calcification of the aortic valve. Aortic valve regurgitation is trivial. Aortic valve sclerosis/calcification is present, without any evidence of aortic stenosis.  7. The inferior vena cava is normal in size with greater than 50% respiratory variability, suggesting right atrial pressure of 3 mmHg. Conclusion(s)/Recommendation(s): Technically difficult study with limited images even with Definity contrast. FINDINGS  Left Ventricle: Left ventricular ejection fraction, by estimation, is 25 to 30%. The left ventricle has severely decreased function. The left ventricle demonstrates regional wall motion abnormalities. Definity  contrast agent was given IV to delineate the left ventricular endocardial borders. The left ventricular internal cavity size was normal in size. There is no left ventricular hypertrophy. Left ventricular diastolic function could not be evaluated due to atrial fibrillation. Left ventricular diastolic function could  not be evaluated.  LV Wall Scoring: The anterior septum, basal anterior segment, and basal inferoseptal segment are dyskinetic. Right Ventricle: The right ventricular size is normal. No increase in right ventricular wall thickness. Right ventricular systolic function is moderately reduced. There is moderately elevated pulmonary artery systolic pressure. The tricuspid regurgitant velocity is 3.52 m/s, and with an assumed right atrial pressure of 3 mmHg, the estimated right ventricular systolic pressure is 52.6 mmHg. Left Atrium: Left atrial size was severely dilated. Right Atrium: Right atrial size was moderately dilated. Pericardium: There is no evidence of pericardial effusion. Mitral Valve: The mitral valve is normal in structure. Trivial mitral valve regurgitation. No evidence of mitral valve stenosis. Tricuspid Valve: The tricuspid valve is normal in structure. Tricuspid valve regurgitation is trivial. No evidence of tricuspid stenosis. Aortic Valve: The aortic valve is normal in structure. There is mild calcification of the aortic valve. Aortic valve regurgitation is trivial. Aortic valve sclerosis/calcification is present, without any evidence of aortic stenosis. Aortic valve mean gradient measures 3.6 mmHg. Aortic valve peak gradient measures 7.4 mmHg. Aortic valve area, by VTI measures 2.80 cm. Pulmonic Valve: The pulmonic valve was not well visualized. Pulmonic valve regurgitation is not visualized. No evidence of pulmonic stenosis. Aorta: The aortic root is normal in size and structure. Venous: The inferior vena cava is normal in size with greater than 50% respiratory variability, suggesting right atrial pressure of 3 mmHg. IAS/Shunts: No atrial level shunt detected by color flow Doppler.  LEFT VENTRICLE PLAX 2D LVIDd:         6.20 cm   Diastology LVIDs:         5.45 cm   LV e' medial:    5.34 cm/s LV PW:         1.20 cm   LV E/e' medial:  19.9 LV IVS:        0.95 cm   LV e' lateral:   9.32 cm/s LVOT  diam:     2.30 cm   LV E/e' lateral: 11.4 LV SV:         72 LV SV Index:   33 LVOT Area:     4.15 cm  RIGHT VENTRICLE RV Basal diam:  3.90 cm RV S prime:     8.19 cm/s TAPSE (M-mode): 1.8 cm LEFT ATRIUM              Index        RIGHT ATRIUM           Index LA diam:        5.30 cm  2.41 cm/m   RA Area:     24.90 cm LA Vol (A2C):   114.0 ml 51.75 ml/m  RA Volume:   77.40 ml  35.13 ml/m LA Vol (A4C):   136.0 ml 61.73 ml/m LA Biplane Vol: 129.0 ml 58.56 ml/m  AORTIC VALVE                    PULMONIC VALVE AV Area (Vmax):    2.65 cm     PV Vmax:       0.77 m/s AV Area (Vmean):   2.52 cm     PV Vmean:  48.267 cm/s AV Area (VTI):     2.80 cm     PV VTI:        0.144 m AV Vmax:           136.00 cm/s  PV Peak grad:  2.4 mmHg AV Vmean:          91.460 cm/s  PV Mean grad:  1.0 mmHg AV VTI:            0.258 m AV Peak Grad:      7.4 mmHg AV Mean Grad:      3.6 mmHg LVOT Vmax:         86.83 cm/s LVOT Vmean:        55.467 cm/s LVOT VTI:          0.174 m LVOT/AV VTI ratio: 0.67  AORTA Ao Root diam: 3.20 cm Ao Asc diam:  3.30 cm MITRAL VALVE                TRICUSPID VALVE MV Area (PHT): 4.56 cm     TR Peak grad:   49.6 mmHg MV Decel Time: 166 msec     TR Vmax:        352.00 cm/s MV E velocity: 106.33 cm/s MV A velocity: 107.00 cm/s  SHUNTS MV E/A ratio:  0.99         Systemic VTI:  0.17 m                             Systemic Diam: 2.30 cm Arvilla Meres MD Electronically signed by Arvilla Meres MD Signature Date/Time: 06/30/2023/3:00:52 PM    Final     Cardiac Studies     Patient Profile     82 y.o. male  history of chronic systolic heart failure due to ischemic cardiomyopathy, CAD, status post ICD with recent extraction, persistent atrial fibrillation who we are consulted for preop evaluation   Assessment & Plan    Perioperative Risk Assessment: Patient has long cardiac history (CAD, ischemic cardiomyopathy, history of ICD s/p explant, atrial fibrillation). Previously had 2 stents placed to LAD  (2001, 2005). Most recent ischemic evaluation was a PET stress test on 07/21/22 that was a high risk study due to presence of large anterior and septal infarcts, severely reduced EF that dropped with stress (EF 15% at rest, 10% with stress). Patient previously had ICD implanted in 2004 for primary prevention- unfortunately, developed possible infection of ICD site in 05/2023 and had device extracted. Treated with Abx after extraction. Patient reports only walking about 100 yards at a time, and does get significant shortness of breath when walking only a few yards. Denies exertional chest pain. His past nuclear stress test in 12/2019 and his more recent PETs stress test from 2023 were both high risk due to low EF, large area of infarct.  -Echo this admission shows EF 25 to 30%, moderate RV dysfunction - Overall, patient is high risk for surgery. Agree with perc chole acutely. With PET stress in 2023 being a high risk study, if there are plans for outpatient cholecystectomy in the future, he would need cardiac catheterization prior to any surgery.  Would recommend outpatient follow-up and consider cardiac catheterization once recovers from acute illness.  Based on results of cath, could proceed with cholecystectomy if no high risk findings.   CAD: Previously had 2 stents placed to LAD (2001, 2005). Most recent ischemic evaluation was a PET stress test on  07/21/22 that was a high risk study due to presence of large anterior and septal infarcts, severely reduced EF that dropped with stress (EF 15% at rest, 10% with stress). Denies chest pain, physical activity limited by back and shoulder pain  - Not on ASA due to eliquis use.  - Continue carvedilol  - Continue crestor  - Plan outpatient follow-up to consider cardiac catheterization as above   Ischemic Cardiomyopathy: Most recent echo from 05/2023 at [facility] showed severe LV dysfunction (EF 30%), normal RV function.  Echo this admission shows EF 25 to 30%,  moderately reduced RV function - In the past, patient has not been able to tolerate much GDMT due to low BP, orthostatic hypotension  - Continue carvedilol 3.125 mg BID  - Overall euvolemic on exam today    Persistent atrial fibrillation: Most recently underwent cardioversion in 04/2022. Now in afib- HR well controlled, and patient overall asymptomatic - Continue carvedilol, eliquis    Otherwise per primary  - Acute cholecystitis  - Hepatic steatosis  - CKD  - Venous inefficiency - Hypothyroidism  [facility] will sign off.   Medication Recommendations: Eliquis 5 mg twice daily, carvedilol 3.125 mg twice daily.  Was on Lasix 40 mg daily at home, recommend discharging on as needed Lasix; recommend monitoring daily weights and can take Lasix 40 mg if gains more than 3 pounds in 1 day or 5 pounds in 1 week Other recommendations (labs, testing, etc): None Follow up as an outpatient: We will schedule   For questions or updates, please contact [facility] Please consult [email or web address] for contact info under        Signed, Little Ishikawa, MD  07/01/2023, 10:33 AM

## 2023-07-02 ENCOUNTER — Telehealth: Payer: Self-pay | Admitting: Cardiovascular Disease

## 2023-07-02 ENCOUNTER — Other Ambulatory Visit: Payer: Self-pay | Admitting: Student

## 2023-07-02 ENCOUNTER — Other Ambulatory Visit (HOSPITAL_COMMUNITY): Payer: Self-pay

## 2023-07-02 DIAGNOSIS — K810 Acute cholecystitis: Secondary | ICD-10-CM

## 2023-07-02 MED ORDER — SULFAMETHOXAZOLE-TRIMETHOPRIM 800-160 MG PO TABS: 1.0000 | ORAL_TABLET | Freq: Two times a day (BID) | ORAL | 0 refills | Status: AC

## 2023-07-02 NOTE — Telephone Encounter (Signed)
Spoke with pt's daughter, [name] and advised per Dr Elease Hashimoto pt should remain off Furosemide until he is seen in the office next month.  Call office if fluid becomes a problem.  Pt's daughter verbalizes understanding and agrees with current plan.

## 2023-07-02 NOTE — Telephone Encounter (Signed)
Pt c/o medication issue:  1. Name of Medication: furosemide. Potassium chloride  2. How are you currently taking this medication (dosage and times per day)? no  3. Are you having a reaction (difficulty breathing--STAT)?   4. What is your medication issue? Daughter calling stating while at hospital pt was taken off these meds. She wants to know if it is ok to stay off these meds. She also states she sent a [employer]. Daughter would like to know before the weekend.

## 2023-07-02 NOTE — Telephone Encounter (Signed)
This has been addressed in another encounter.  Please see that encounter for complete details as below.    [name], RN  to [name]      07/02/23  4:26 PM Yes, Dr Elease Hashimoto would like him to remain off of it until he's seen here next month. If he runs into problems with fluid status before then, let us know and we'll address. Maralyn Sago, RN  This MyChart message has not been read.       You routed this conversation to [name], RN [name]  to [facility] [address] Triage (supporting Vesta Mixer, MD)      07/02/23 12:18 PM Hospital took him off furosemide???  Should we continue at home?

## 2023-07-02 NOTE — Progress Notes (Signed)
   Communicated with patient about culture results. Switched antibiotic therapy as there is no data for citrobacter for the current Augmentin therapy. Patient was switched to bactrim therapy. Precautions reviewed.   Discussed with attending physician Dr. Buford Dresser, MD

## 2023-07-04 LAB — AEROBIC/ANAEROBIC CULTURE W GRAM STAIN (SURGICAL/DEEP WOUND)

## 2023-07-06 ENCOUNTER — Telehealth: Payer: Self-pay | Admitting: Gastroenterology

## 2023-07-06 NOTE — Telephone Encounter (Signed)
PT daughter is calling to get the results of his ano rectal manometry. She said it is very important that they get the results immediately because he got sick after having it and has been hospitalized since then. Please advise.

## 2023-07-06 NOTE — Progress Notes (Signed)
Cardiology Office Note:  .   Date:  07/20/2023  ID:  [name], DOB 11-06-1940, MRN [id number] PCP: Kaleen Mask, MD  [facility] Providers Cardiologist:  Kristeen Miss, MD Electrophysiologist:  Sherryl Manges, MD    History of Present Illness: .   [name] is a 82 y.o. male  with history (CAD, ischemic cardiomyopathy, history of ICD s/p explant, atrial fibrillation). Previously had 2 stents placed to LAD (2001, 2005). Most recent ischemic evaluation was a PET stress test on 07/21/22 that was a high risk study due to presence of large anterior and septal infarcts, severely reduced EF that dropped with stress (EF 15% at rest, 10% with stress). Patient previously had ICD implanted in 2004 for primary prevention- unfortunately, developed possible infection of ICD site in 05/2023 and had device extracted. Treated with Abx after extraction. Patient reports only walking about 100 yards at a time, and does get significant shortness of breath when walking only a few yards. Denies exertional chest pain. His past nuclear stress test in 12/2019 and his more recent PETs stress test from 2023 were both high risk due to low EF, large area of infarct.  -Echo this admission shows EF 25 to 30%, moderate RV dysfunction.  Patient seen in the hospital 07/01/23 with acute cholecystitis and plan was he would need a cath prior to any surgery. Dr. Elease Hashimoto told them not to resume diuretics until seen back in office.   Patient comes in for hospital f/u. Still has tender gallbladder and drainage in place. Denies chest pain or SOB. Sees Dr. Dwain Sarna 08/12/23. He is weighing daily and no change off lasix and potassium. Last Crt 1.46. Eating ok but Saturday vomited after eating out.   ROS:    Studies Reviewed: [name]         Prior CV Studies:    Echo 06/30/23 IMPRESSIONS     1. Left ventricular ejection fraction, by estimation, is 25 to 30%. The  left ventricle has severely decreased function. The  left ventricle  demonstrates regional wall motion abnormalities (see scoring  diagram/findings for description). Left ventricular  diastolic function could not be evaluated.   2. Right ventricular systolic function is moderately reduced. The right  ventricular size is normal. There is moderately elevated pulmonary artery  systolic pressure.   3. Left atrial size was severely dilated.   4. Right atrial size was moderately dilated.   5. The mitral valve is normal in structure. Trivial mitral valve  regurgitation. No evidence of mitral stenosis.   6. The aortic valve is normal in structure. There is mild calcification  of the aortic valve. Aortic valve regurgitation is trivial. Aortic valve  sclerosis/calcification is present, without any evidence of aortic  stenosis.   7. The inferior vena cava is normal in size with greater than 50%  respiratory variability, suggesting right atrial pressure of 3 mmHg.   Conclusion(s)/Recommendation(s): Technically difficult study with limited  images even with Definity contrast.   PET Stress test from 07/21/22    Findings are consistent with prior myocardial infarction. 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   LV perfusion is abnormal. There is evidence of infarction. Defect 1: There is a large defect with severe reduction in uptake present in the apical to basal anterior, anteroseptal and apex location(s) that is fixed. There is abnormal wall  motion in the defect area. Consistent with infarction. Defect 2: There is a small defect with moderate reduction in uptake present in the apical to mid inferoseptal location(s) that is fixed. There is abnormal wall motion in the defect area. Consistent with infarction.   Rest left ventricular function is abnormal. Rest global function is severely reduced. There were multiple  regional abnormalities. Rest EF: 15 %. Stress left ventricular function is abnormal. Stress global function is severely reduced. There were multiple regional abnormalities. Stress EF: 10 %. End systolic cavity size is severely enlarged. Recommend TTE for further evaluation of LVEF.   Myocardial blood flow was computed to be 0.45ml/g/min at rest and 0.75ml/g/min at stress. Global myocardial blood flow reserve was 1.83 and was abnormal.   Coronary calcium was present on the attenuation correction CT images. Severe coronary calcifications were present. Coronary calcifications were present in the left anterior descending artery, left circumflex artery and right coronary artery distribution(s)    Risk Assessment/Calculations:    CHA2DS2-VASc Score = 4   This indicates a 4.8% annual risk of stroke. The patient's score is based upon: CHF History: 1 HTN History: 0 Diabetes History: 0 Stroke History: 0 Vascular Disease History: 1 Age Score: 2 Gender Score: 0            Physical Exam:   VS:  BP 120/78   Pulse 77   Ht 5\' 10"  (1.778 m)   Wt 222 lb (100.7 kg)   SpO2 96%   BMI 31.85 kg/m    Wt Readings from Last 3 Encounters:  07/20/23 222 lb (100.7 kg)  06/29/23 227 lb 1.2 oz (103 kg)  06/14/23 222 lb (100.7 kg)    GEN: Obese, in no acute distress NECK: No JVD; No carotid bruits CARDIAC:  RRR, no murmurs, rubs, gallops RESPIRATORY:  Clear to auscultation without rales, wheezing or rhonchi  ABDOMEN: Soft, non-tender, non-distended EXTREMITIES:  trace edema; No deformity   ASSESSMENT AND PLAN: .    CAD: Previously had 2 stents placed to LAD (2001, 2005). Most recent ischemic evaluation was a PET stress test on 07/21/22 that was a high risk study due to presence of large anterior and septal infarcts, severely reduced EF that dropped with stress (EF 15% at rest, 10% with stress). Denies chest pain, physical activity limited by back and shoulder pain  - Not on ASA due to eliquis use.  -  Continue carvedilol  - Continue crestor  - Plan  to consider cardiac catheterization as above-awaiting to see if surgery is imminent. He sees Dr. Dwain Sarna 08/12/23 and has GB drain replaced 08/11/23. Still having abdominal discomfort   Ischemic Cardiomyopathy: Most recent echo from 05/2023 at [facility] showed severe LV dysfunction (EF 30%), normal RV function.  Echo 10/24 shows EF 25 to 30%, moderately reduced RV function - In the past, patient has not been able to tolerate much GDMT due to low BP, orthostatic hypotension  - Continue carvedilol 3.125 mg BID  - Overall euvolemic on exam today, trace of leg edema, no change off lasix/k. Crt 1.28 in [facility] 07/07/23   Persistent atrial fibrillation: Most recently underwent cardioversion in 04/2022. back in afib in hospital, HR regular today- HR well controlled, and patient overall asymptomatic - Continue carvedilol, eliquis   ICD removed recently due to infection at [facility] with no plans for re-implant of device.           Dispo: f/u with Dr. Elease Hashimoto  Signed, Jacolyn Reedy, PA-C

## 2023-07-06 NOTE — Telephone Encounter (Signed)
He has dyssynergic defecation/pelvic floor dysfunction, please send referral to pelvic floor physical therapy.  It is unlikely for anorectal manometry to cause gallbladder infection.

## 2023-07-06 NOTE — Telephone Encounter (Signed)
I called the daughter. She wants me to tell let her know once the results of the anal manometry are sent to Dr Jeannetta Nap.  The patient and the daughter are concerned that the anorectal manometry introduced bacteria into his gallbladder. Their understanding of the "gallbladder infection" is that he had a bacteria that came from the hospital.

## 2023-07-07 NOTE — Telephone Encounter (Signed)
Spoke with [name]. Advised of the results. Copy faxed to Dr Jeannetta Nap. Offered to refer to PT. Ms. Earlene Plater will inquire through Dr Jeannetta Nap if this is available through home health.

## 2023-07-08 DIAGNOSIS — R159 Full incontinence of feces: Secondary | ICD-10-CM

## 2023-07-08 DIAGNOSIS — K5902 Outlet dysfunction constipation: Secondary | ICD-10-CM

## 2023-07-20 ENCOUNTER — Ambulatory Visit: Payer: Medicare PPO | Attending: Physician Assistant | Admitting: Physician Assistant

## 2023-07-20 ENCOUNTER — Encounter: Payer: Self-pay | Admitting: Physician Assistant

## 2023-07-20 VITALS — BP 120/78 | HR 77 | Ht 70.0 in | Wt 222.0 lb

## 2023-07-20 DIAGNOSIS — I255 Ischemic cardiomyopathy: Secondary | ICD-10-CM

## 2023-07-20 DIAGNOSIS — Z95810 Presence of automatic (implantable) cardiac defibrillator: Secondary | ICD-10-CM | POA: Diagnosis not present

## 2023-07-20 DIAGNOSIS — I2510 Atherosclerotic heart disease of native coronary artery without angina pectoris: Secondary | ICD-10-CM | POA: Diagnosis not present

## 2023-07-20 DIAGNOSIS — I4821 Permanent atrial fibrillation: Secondary | ICD-10-CM | POA: Diagnosis not present

## 2023-07-20 NOTE — Patient Instructions (Signed)
Medication Instructions:  °Your physician recommends that you continue on your current medications as directed. Please refer to the Current Medication list given to you today. ° °*If you need a refill on your cardiac medications before your next appointment, please call your pharmacy* ° °Lab Work: °None ordered today ° °Testing/Procedures: °None ordered today ° °Follow-Up: °At [facility], you and your health needs are our priority.  As part of our continuing mission to provide you with exceptional heart care, we have created designated Provider Care Teams.  These Care Teams include your primary Cardiologist (physician) and Advanced Practice Providers (APPs -  Physician Assistants and Nurse Practitioners) who all work together to provide you with the care you need, when you need it. ° °Your next appointment:   °3 month(s) ° °The format for your next appointment:   °In Person ° °Provider:   °Philip Nahser, MD ° ° ° °

## 2023-07-27 ENCOUNTER — Other Ambulatory Visit: Payer: Self-pay | Admitting: Cardiovascular Disease

## 2023-08-02 ENCOUNTER — Telehealth: Payer: Self-pay | Admitting: Cardiovascular Disease

## 2023-08-02 NOTE — Telephone Encounter (Signed)
*  STAT* If patient is at the pharmacy, call can be transferred to refill team.   1. Which medications need to be refilled? (please list name of each medication and dose if known)   levothyroxine (SYNTHROID) 125 MCG tablet   2. Would you like to learn more about the convenience, safety, & potential cost savings by using the [facility]?   3. Are you open to using the [facility] (Type [facility]. ).  4. Which pharmacy/location (including street and city if local pharmacy) is medication to be sent to?  [facility] - [place], [place] - [address]   5. Do they need a 30 day or 90 day supply?   90 day  Patient stated he still has some medication.

## 2023-08-03 ENCOUNTER — Other Ambulatory Visit: Payer: Self-pay

## 2023-08-03 MED ORDER — LEVOTHYROXINE SODIUM 125 MCG PO TABS: 125.0000 ug | ORAL_TABLET | Freq: Every day | ORAL | 3 refills | Status: AC

## 2023-08-03 NOTE — Telephone Encounter (Signed)
RX sent to requested Pharmacy

## 2023-08-11 ENCOUNTER — Ambulatory Visit (HOSPITAL_COMMUNITY)
Admission: RE | Admit: 2023-08-11 | Discharge: 2023-08-11 | Disposition: A | Discharge status: Home / Self Care | Payer: Medicare PPO | Source: Ambulatory Visit | Attending: Student | Admitting: Student

## 2023-08-11 ENCOUNTER — Other Ambulatory Visit (HOSPITAL_COMMUNITY): Payer: Self-pay | Admitting: Interventional Radiology

## 2023-08-11 ENCOUNTER — Other Ambulatory Visit (HOSPITAL_COMMUNITY): Payer: Self-pay | Admitting: Student

## 2023-08-11 DIAGNOSIS — Z434 Encounter for attention to other artificial openings of digestive tract: Secondary | ICD-10-CM | POA: Insufficient documentation

## 2023-08-11 DIAGNOSIS — K819 Cholecystitis, unspecified: Secondary | ICD-10-CM

## 2023-08-11 HISTORY — PX: IR EXCHANGE BILIARY DRAIN: IMG6046

## 2023-08-11 MED ORDER — LIDOCAINE-EPINEPHRINE 1 %-1:100000 IJ SOLN
10.0000 mL | Freq: Once | INTRAMUSCULAR | Status: AC
  Administered 2023-08-11: 10 mL via INTRADERMAL

## 2023-08-11 MED ORDER — IOHEXOL 300 MG/ML  SOLN
50.0000 mL | Freq: Once | INTRAMUSCULAR | Status: AC | PRN
  Administered 2023-08-11: 15 mL

## 2023-08-11 MED ORDER — LIDOCAINE-EPINEPHRINE 1 %-1:100000 IJ SOLN
INTRAMUSCULAR | Status: AC
  Filled 2023-08-11: qty 1

## 2023-08-12 ENCOUNTER — Ambulatory Visit: Payer: Medicare PPO

## 2023-08-12 ENCOUNTER — Telehealth: Payer: Self-pay | Admitting: Cardiovascular Disease

## 2023-08-12 DIAGNOSIS — Z01810 Encounter for preprocedural cardiovascular examination: Secondary | ICD-10-CM

## 2023-08-12 NOTE — Telephone Encounter (Signed)
Patient is requesting call back to discuss procedure he was told he would need to have prior to having gallbladder surgery. Requesting call back to discuss further. Also states that he would like to hear from Dr. Elease Hashimoto himself at some point before his appt.

## 2023-08-13 NOTE — Telephone Encounter (Signed)
Follow Up:     Patient is calling back, he said he was waiting to hear something.

## 2023-08-13 NOTE — Telephone Encounter (Signed)
Pt seen by James Carson, PA on 07/20/23: - Plan  to consider cardiac catheterization as above-awaiting to see if surgery is imminent. He sees Dr. Dwain Sarna 08/12/23 and has GB drain replaced 08/11/23. Still having abdominal discomfort  Patient states he saw [facility] yesterday and surgery is planned to happen, but he will not do it until patient has a cath. He is concerned about pt's heart withstanding the gallbladder surgery. Patient states Dwain Sarna was supposed to call office yesterday. Pt is scheduled to see Edd Fabian, NP 08/24/23 to set up cath, but would like to see if [facility] will go ahead and order so he doesn't have to wait. Will route to MD.

## 2023-08-16 ENCOUNTER — Ambulatory Visit: Payer: Medicare PPO

## 2023-08-16 NOTE — Telephone Encounter (Signed)
[name], James Ping, MD  You; Dyann Kief, PA-C2 days ago   Pt was seen by Belva Agee recently Please schedule cardiac cath .  If the cath is scheduled after Dec. 12, he will need to have another office visit He will need to hold eliquis for 2 days prior to cath Please have him start ASA 81 mg a day while he is off Eliquis  PN   Called and spoke with daughter [name] on DPR/ Pt scheduled to see Tereso Newcomer, PA on 08/17/23. Labs and EKG will be updated in office. Cath scheduled for Fri 08/20/23 w/Patwardhan at 7:30am.  *Pt requested first case, so scheduled in that slot, however, based on last labs, he may need IV hydration. Will await current results to see if pt slot should be moved. Advised to drink extra water day before. Details of cath sent to patient via MyChart

## 2023-08-16 NOTE — Progress Notes (Unsigned)
Cardiology Office Note:    Date:  08/17/2023  ID:  [name], DOB 23-Jun-1941, MRN [id number] PCP: Kaleen Mask, MD  [facility] Providers Cardiologist:  Kristeen Miss, MD Electrophysiologist:  Sherryl Manges, MD       Patient Profile:      Coronary artery disease  S/p anterior STEMI in 2001 tx with PCI of LAD Cath 06/23/2004: LAD stents patent, D1 80-90 (small, not amenable to PCI) Myo PET : Lg ant and septal infarcts, no ischemia, EF 15, global myocardial blood flow reserve 1.83 (abnormal) (HFrEF) heart failure with reduced ejection fraction   Ischemic CM TTE 06/30/23: EF 25-30, ant and inf-sept DK, mod reduced RVSF, mod pulm HTN, RVSP 52.6, severe LAE, mod RAE, trivial MR, trivial AI, AV sclerosis, RAP 3  GDMT limited due to low BP Chronic Warfarin Rx  >> Apixaban  S/p CRT-D LV lead non-functional; device reprogrammed 05/2020 to allow LV pacing 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  [name] is a 82 y.o. male who returns to arrange cardiac catheterization for surgical clearance. He was admitted in 06/2023 with acute cholecystitis tx with IV Abxs and percutaneous drain. He was followed by cardiology. He was felt to be high risk for surgery and it was recommended that he undergo cardiac catheterization prior to any surgery. This could be set up as an outpatient after he recovered from his acute illness. He was last seen in clinic 07/20/23 by James Reedy, PA-C. He had follow up with Dr. Dwain Sarna 12/5 who felt that surgery is his best option. Dr.  Elease Hashimoto has requested that the pt be set up for cardiac catheterization.  He is here with his daughter. The patient has been coughing since Sunday, which is suspected to be bronchitis. The cough is described as yellowish in color, with no associated fever, blood, or significant shortness of breath. He saw his PCP yesterday and was started on prednisone, doxycycline. His cough is actually improved over the last couple of days. The patient's weight has remained stable, but there is some swelling reported. He denies any chest pain, pressure, or episodes of passing out. The patient was previously on amiodarone, which has been discontinued. The patient denies any history of smoking.      Review of Systems  Constitutional: Negative for fever.  Respiratory:  Positive for cough and sputum production.   Gastrointestinal:  Negative for hematochezia.  Genitourinary:  Negative for hematuria.  See HPI     Studies Reviewed:   EKG Interpretation Date/Time:  Tuesday August 17 2023 08:21:11 EST Ventricular Rate:  79 PR Interval:    QRS Duration:  164 QT Interval:  430 QTC Calculation: 493 R Axis:   -64  Text Interpretation: Atrial fibrillation with premature ventricular or aberrantly conducted complexes Left axis deviation Left bundle branch block No significant change since last tracing Confirmed by Tereso Newcomer [phone]) on 08/17/2023 8:59:31 AM           Risk Assessment/Calculations:    CHA2DS2-VASc Score = 4   This indicates a 4.8% annual risk of  stroke. The patient's score is based upon: CHF History: 1 HTN History: 0 Diabetes History: 0 Stroke History: 0 Vascular Disease History: 1 Age Score: 2 Gender Score: 0            Physical Exam:   VS:  BP 94/60 (BP Location: Right Arm, Patient Position: Sitting, Cuff Size: Normal)   Pulse 75   Ht 5\' 10"  (1.778 m)   Wt 224 lb (101.6 kg)   SpO2 92%   BMI 32.14 kg/m    Wt Readings from Last 3 Encounters:  08/17/23 224 lb (101.6 kg)   07/20/23 222 lb (100.7 kg)  06/29/23 227 lb 1.2 oz (103 kg)    Constitutional:      Appearance: Healthy appearance. Not in distress.  Neck:     Vascular: No JVR. JVD normal.  Pulmonary:     Breath sounds: Scattered Wheezing (faint, insp - vs upper airway noise) present. No rales.  Cardiovascular:     Normal rate. Irregularly irregular rhythm.     Murmurs: There is no murmur.  Edema:    Peripheral edema present.    Pretibial: 1+ edema of the left pretibial area and 2+ edema of the right pretibial area. Abdominal:     Palpations: Abdomen is soft.  Skin:    General: Skin is warm and dry.        Assessment and Plan:   Assessment & Plan Coronary artery disease involving coronary bypass graft of native heart without angina pectoris History of anterior STEMI in 2001 treated with PCI of the LAD.  Cardiac catheterization 2005 with patent LAD stents and 80-90% D1 stenosis.  D1 was small and not amenable to PCI.  Recent myocardial PET in 07/2022 with large anterior and septal infarcts, EF 15%, no ischemia.  Study was high risk due to large area of infarction and low EF.  He was recently admitted with cholecystitis treated with IV antibiotics and percutaneous drain.  It was felt that if he needs surgery, he should undergo cardiac catheterization.  He is considered high risk.  He has seen general surgery who feels that cholecystectomy is his best option.  Dr. Elease Hashimoto has requested the patient undergo cardiac catheterization.  He is scheduled for 08/20/2023. He has a recent dx of bronchitis and is now on prednisone, doxycycline. We discussed postponing his cath until next week vs proceeding with it on Friday. He feels he can lay flat without any issues. If his symptoms continue to improve, he can proceed on Friday. He knows to contact us Thursday if he feels worse, develops shortness of breath or fever so that we can postpone.  -Labs received from PCP 08/16/23: Hgb 12.5, Creatinine 0.96, K 4.6 -Cardiac  catheterization 08/20/23 with Dr. Rosemary Holms. -Continue Coreg 3.125 mg twice daily, NTG prn, Crestor 5 mg once daily  -Follow up post cath HFrEF (heart failure with reduced ejection fraction) (HCC) EF 25-30 by echocardiogram October 2024.  Ischemic cardiomyopathy.  NYHA IIb. He has some lower extremity edema but overall volume appears stable. He was taken off Lasix when his ICD was explanted. Weights have remained stable. Would hold off on adding Lasix with need for cardiac catheterization in the next few days. GDMT has been limited by low BP in the past.  -Continue Coreg 3.125 mg twice daily  -Consider resuming Lasix at follow up if needed Permanent atrial fibrillation [facility]) He has been back in atrial fibrillation since at least September.  He is off of amiodarone.Rate is controlled. Continue Coreg  3.125 mg twice daily, Eliquis 5 mg twice daily. Stage 3a chronic kidney disease (HCC) Recent Creatinine normal.     Informed Consent   Shared Decision Making/Informed Consent The risks [stroke (1 in 1000), death (1 in 1000), kidney failure [usually temporary] (1 in 500), bleeding (1 in 200), allergic reaction [possibly serious] (1 in 200)], benefits (diagnostic support and management of coronary artery disease) and alternatives of a cardiac catheterization were discussed in detail with Mr. Anderer and he is willing to proceed.     Dispo:  Return for Post Procedure Follow Up.  Signed, Tereso Newcomer, PA-C

## 2023-08-17 ENCOUNTER — Encounter: Payer: Self-pay | Admitting: Physician Assistant

## 2023-08-17 ENCOUNTER — Ambulatory Visit: Payer: Medicare PPO | Attending: Physician Assistant | Admitting: Physician Assistant

## 2023-08-17 ENCOUNTER — Other Ambulatory Visit: Payer: Self-pay | Admitting: *Deleted

## 2023-08-17 VITALS — BP 94/60 | HR 75 | Ht 70.0 in | Wt 224.0 lb

## 2023-08-17 DIAGNOSIS — N1831 Chronic kidney disease, stage 3a: Secondary | ICD-10-CM | POA: Diagnosis not present

## 2023-08-17 DIAGNOSIS — I5020 Unspecified systolic (congestive) heart failure: Secondary | ICD-10-CM | POA: Diagnosis not present

## 2023-08-17 DIAGNOSIS — I4821 Permanent atrial fibrillation: Secondary | ICD-10-CM

## 2023-08-17 DIAGNOSIS — I25810 Atherosclerosis of coronary artery bypass graft(s) without angina pectoris: Secondary | ICD-10-CM | POA: Diagnosis not present

## 2023-08-17 DIAGNOSIS — Z01810 Encounter for preprocedural cardiovascular examination: Secondary | ICD-10-CM

## 2023-08-17 NOTE — Patient Instructions (Addendum)
Medication Instructions:  Your physician recommends that you continue on your current medications as directed. Please refer to the Current Medication list given to you today.  *If you need a refill on your cardiac medications before your next appointment, please call your pharmacy*   Lab Work: None today  If you have labs (blood work) drawn today and your tests are completely normal, you will receive your results only by: MyChart Message (if you have MyChart) OR A paper copy in the mail If you have any lab test that is abnormal or we need to change your treatment, we will call you to review the results.   Testing/Procedures: Your physician has requested that you have a cardiac catheterization. Cardiac catheterization is used to diagnose and/or treat various heart conditions. Doctors may recommend this procedure for a number of different reasons. The most common reason is to evaluate chest pain. Chest pain can be a symptom of coronary artery disease (CAD), and cardiac catheterization can show whether plaque is narrowing or blocking your heart's arteries. This procedure is also used to evaluate the valves, as well as measure the blood flow and oxygen levels in different parts of your heart. For further information please visit [email or web address] Please follow instruction sheet, as given BELOW:    [facility] A DEPT OF [place] AT [facility] [address] [place], [place] [address] [place] [address] Dept: [phone] Loc: [phone]   [name]                08/16/2023   You are scheduled for a Cardiac Catheterization on Friday, December 13 with Dr. Truett Mainland.   1. Please arrive at the [facility] (Main Entrance A) at [facility]: [address] [place], [place] [address] at 5:30 AM (This time is two hour(s) before your procedure to ensure your preparation).    Free valet parking service is available. You will  check in at ADMITTING. The support person will be asked to wait in the waiting room.  It is OK to have someone drop you off and come back when you are ready to be discharged.     Special note: Every effort is made to have your procedure done on time. Please understand that emergencies sometimes delay scheduled procedures.   2. Diet: Please drink extra fluids (water) the day before procedure! Do not eat solid foods after midnight.  The patient may have clear liquids until 5am upon the day of the procedure.   3. Labs: You will need to have blood drawn at office on 08/17/23   4. Medication instructions in preparation for your procedure:    Contrast Allergy: No   DO NOT TAKE ELIQUIS for 2 days prior to procedure and resume day after (last dose Tuesday PM, resume Saturday)   On the morning of your procedure, take Aspirin 81 mg and any morning medicines NOT listed above.  You may use sips of water.   5. Plan to go home the same day, you will only stay overnight if medically necessary. 6. Bring a current list of your medications and current insurance cards. 7. You MUST have a responsible person to drive you home. 8. Someone MUST be with you the first 24 hours after you arrive home or your discharge will be delayed. 9. Please wear clothes that are easy to get on and off and wear slip-on shoes.   Thank you for allowing [place] to care for you!   --   Invasive Cardiovascular service   Follow-Up: At [facility], you and your health needs are our priority.  As part of our continuing mission to provide you with exceptional heart care, we have created designated Provider Care Teams.  These Care Teams include your primary Cardiologist (physician) and Advanced Practice Providers (APPs -  Physician Assistants and Nurse Practitioners) who all work together to provide you with the care you need, when you need it.  We recommend signing up for the patient portal called "MyChart".  Sign up  information is provided on this After Visit Summary.  MyChart is used to connect with patients for Virtual Visits (Telemedicine).  Patients are able to view lab/test results, encounter notes, upcoming appointments, etc.  Non-urgent messages can be sent to your provider as well.   To learn more about what you can do with MyChart, go to [email or web address].    Your next appointment:   2-4 week(s)  Provider:   Kristeen Miss, MD     Other Instructions

## 2023-08-17 NOTE — Assessment & Plan Note (Addendum)
He has been back in atrial fibrillation since at least September.  He is off of amiodarone.Rate is controlled. Continue Coreg 3.125 mg twice daily, Eliquis 5 mg twice daily.

## 2023-08-17 NOTE — Assessment & Plan Note (Signed)
Recent Creatinine normal.

## 2023-08-17 NOTE — Assessment & Plan Note (Addendum)
History of anterior STEMI in 2001 treated with PCI of the LAD.  Cardiac catheterization 2005 with patent LAD stents and 80-90% D1 stenosis.  D1 was small and not amenable to PCI.  Recent myocardial PET in 07/2022 with large anterior and septal infarcts, EF 15%, no ischemia.  Study was high risk due to large area of infarction and low EF.  He was recently admitted with cholecystitis treated with IV antibiotics and percutaneous drain.  It was felt that if he needs surgery, he should undergo cardiac catheterization.  He is considered high risk.  He has seen general surgery who feels that cholecystectomy is his best option.  Dr. Elease Hashimoto has requested the patient undergo cardiac catheterization.  He is scheduled for 08/20/2023. He has a recent dx of bronchitis and is now on prednisone, doxycycline. We discussed postponing his cath until next week vs proceeding with it on Friday. He feels he can lay flat without any issues. If his symptoms continue to improve, he can proceed on Friday. He knows to contact us Thursday if he feels worse, develops shortness of breath or fever so that we can postpone.  -Labs received from PCP 08/16/23: Hgb 12.5, Creatinine 0.96, K 4.6 -Cardiac catheterization 08/20/23 with Dr. Rosemary Holms. -Continue Coreg 3.125 mg twice daily, NTG prn, Crestor 5 mg once daily  -Follow up post cath

## 2023-08-17 NOTE — Progress Notes (Deleted)
Cardiology Clinic Note   Patient Name: [name] Date of Encounter: 08/17/2023  Primary Care Provider:  Kaleen Mask, MD Primary Cardiologist:  Kristeen Miss, MD  Patient Profile    ***  Past Medical History    Past Medical History:  Diagnosis Date   AICD (automatic cardioverter/defibrillator) present    Arthritis    knees, back    BPH (benign prostatic hypertrophy)    CHF (congestive heart failure) (HCC)    Chronic kidney disease    BPH   Chronic systolic heart failure (HCC)    a. s/p MDT single chamber ICD 2004 as part of MASTER study b. upgrade to CRTD 2010; CRTD gen change 2016   Coronary artery disease    a. s/p anterior MI and CYPHER stent to LAD 2005   Dyslipidemia    Dysrhythmia    a-fib   Gout    Ischemic cardiomyopathy    Paroxysmal atrial fibrillation (HCC)    Ventricular tachycardia (HCC)    Past Surgical History:  Procedure Laterality Date   ANAL RECTAL MANOMETRY N/A 06/23/2023   Procedure: ANO RECTAL MANOMETRY;  Surgeon: Napoleon Form, MD;  Location: [facility] ENDOSCOPY;  Service: Gastroenterology;  Laterality: N/A;   APPENDECTOMY  1970   BI-VENTRICULAR IMPLANTABLE CARDIOVERTER DEFIBRILLATOR UPGRADE N/A 11/07/2014   a. MDT single chamber ICD implanted 2004 as part of MASTER study; upgrade to CRTD 2010; gen change 2016   BIV ICD GENERATOR CHANGEOUT N/A 11/12/2021   Procedure: BIV ICD GENERATOR CHANGEOUT;  Surgeon: Duke Salvia, MD;  Location: [facility] INVASIVE CV LAB;  Service: Cardiovascular;  Laterality: N/A;   CARDIAC CATHETERIZATION  09/08/2003   x3 total of 5 stents   CARDIOVERSION N/A 07/04/2019   Procedure: CARDIOVERSION;  Surgeon: Wendall Stade, MD;  Location: [facility] ENDOSCOPY;  Service: Cardiovascular;  Laterality: N/A;   CARDIOVERSION N/A 08/17/2019   Procedure: CARDIOVERSION;  Surgeon: Vesta Mixer, MD;  Location: [facility] ENDOSCOPY;  Service: Cardiovascular;  Laterality: N/A;   CARDIOVERSION N/A 05/04/2022   Procedure:  CARDIOVERSION;  Surgeon: Quintella Reichert, MD;  Location: [facility] ENDOSCOPY;  Service: Cardiovascular;  Laterality: N/A;   IR EXCHANGE BILIARY DRAIN  08/11/2023   IR PERC CHOLECYSTOSTOMY  06/29/2023   PROSTATECTOMY  11/06/2011   Procedure: PROSTATECTOMY SUPRAPUBIC;  Surgeon: Kathi Ludwig, MD;  Location: [facility] ORS;  Service: Urology;  Laterality: N/A;  Open Suprapubic Prostatectomy   TEE WITHOUT CARDIOVERSION N/A 05/16/2019   Procedure: TRANSESOPHAGEAL ECHOCARDIOGRAM (TEE);  Surgeon: Sande Rives, MD;  Location: [facility] ENDOSCOPY;  Service: Cardiology;  Laterality: N/A;   TOTAL KNEE ARTHROPLASTY Left 10/06/2021   Procedure: TOTAL KNEE ARTHROPLASTY;  Surgeon: Jodi Geralds, MD;  Location: [facility] ORS;  Service: Orthopedics;  Laterality: Left;    Allergies  Allergies  Allergen Reactions   Lipitor [Atorvastatin] Other (See Comments)    MUSCLE ACHE    Jardiance [Empagliflozin] Other (See Comments)    Intolerance     History of Present Illness    ***  Home Medications    Prior to Admission medications   Medication Sig Start Date End Date Taking? Authorizing Provider  acetaminophen (TYLENOL) 325 MG tablet Take 2 tablets (650 mg total) by mouth every 4 (four) hours as needed for headache or mild pain. Patient not taking: Reported on 08/16/2023 05/27/23   Sheilah Pigeon, PA-C  albuterol (VENTOLIN HFA) 108 (90 Base) MCG/ACT inhaler Inhale 2 puffs into the lungs every 6 (six) hours as needed for wheezing or shortness of  breath. 08/16/23   [provider]  allopurinol (ZYLOPRIM) 100 MG tablet Take 100 mg by mouth at bedtime. 05/14/15   [provider]  apixaban (ELIQUIS) 5 MG TABS tablet Take 5 mg by mouth 2 (two) times daily.    [provider]  carvedilol (COREG) 3.125 MG tablet TAKE 1 TABLET TWICE DAILY 07/29/23   Nahser, Deloris Ping, MD  chlorpheniramine-HYDROcodone (TUSSIONEX) 10-8 MG/5ML Take 5 mLs by mouth every 12 (twelve) hours as needed. 08/16/23   [provider]  doxycycline (VIBRAMYCIN) 100 MG capsule Take 100 mg by mouth 2 (two) times daily. 08/16/23   [provider]  latanoprost (XALATAN) 0.005 % ophthalmic solution Place 1 drop into both eyes at bedtime.  10/03/13   [provider]  levothyroxine (SYNTHROID) 125 MCG tablet Take 1 tablet (125 mcg total) by mouth daily before breakfast. 08/03/23   Nahser, Deloris Ping, MD  methylPREDNISolone (MEDROL DOSEPAK) 4 MG TBPK tablet Take 4 mg by mouth. 08/16/23   [provider]  nitroGLYCERIN (NITROSTAT) 0.4 MG SL tablet Place 1 tablet (0.4 mg total) under the tongue every 5 (five) minutes as needed for chest pain. 12/12/19   Nahser, Deloris Ping, MD  rosuvastatin (CRESTOR) 10 MG tablet TAKE 1/2 TABLET EVERY DAY 03/24/23   Nahser, Deloris Ping, MD  sodium chloride flush (NS) 0.9 % SOLN Flush gallbladder drain once daily with at least 5 ml normal saline. Discard extra 5ml after use of the 10ml syringe 06/30/23   Mickie Kay, NP    Family History    Family History  Problem Relation Age of Onset   Heart disease Sister        3 52f the 4 had HD   Heart disease Brother        2 of 3 had HD   Heart disease Sister        1 of the 6 has HD   Heart disease Brother    He indicated that his mother is deceased. He indicated that his father is deceased. He indicated that only one of his two sisters is alive. He indicated that only one of his two brothers is alive. He indicated that his maternal grandmother is deceased. He indicated that his maternal grandfather is deceased. He indicated that his paternal grandmother is deceased. He indicated that his paternal grandfather is deceased.  Social History    Social History   Socioeconomic History   Marital status: Married    Spouse name: Not on file   Number of children: Not on file   Years of education: Not on file   Highest education level: Not on file  Occupational History   Not on file  Tobacco Use   Smoking status: Never     Passive exposure: Never   Smokeless tobacco: Never  Vaping Use   Vaping status: Never Used  Substance and Sexual Activity   Alcohol use: No    Comment: occasional beer    Drug use: No   Sexual activity: Not on file  Other Topics Concern   Not on file  Social History Narrative   Not on file   Social Determinants of Health   Financial Resource Strain: Not on file  Food Insecurity: No Food Insecurity (06/26/2023)   Hunger Vital Sign    Worried About Running Out of Food in the Last Year: Never true    Ran Out of Food in the Last Year: Never true  Transportation Needs: No Transportation Needs (06/26/2023)  PRAPARE - Administrator, Civil Service (Medical): No    Lack of Transportation (Non-Medical): No  Physical Activity: Not on file  Stress: Not on file  Social Connections: Not on file  Intimate Partner Violence: Not At Risk (06/26/2023)   Humiliation, Afraid, Rape, and Kick questionnaire    Fear of Current or Ex-Partner: No    Emotionally Abused: No    Physically Abused: No    Sexually Abused: No     Review of Systems    General:  No chills, fever, night sweats or weight changes.  Cardiovascular:  No chest pain, dyspnea on exertion, edema, orthopnea, palpitations, paroxysmal nocturnal dyspnea. Dermatological: No rash, lesions/masses Respiratory: No cough, dyspnea Urologic: No hematuria, dysuria Abdominal:   No nausea, vomiting, diarrhea, bright red blood per rectum, melena, or hematemesis Neurologic:  No visual changes, wkns, changes in mental status. All other systems reviewed and are otherwise negative except as noted above.  Physical Exam    VS:  There were no vitals taken for this visit. , BMI There is no height or weight on file to calculate BMI. GEN: Well nourished, well developed, in no acute distress. HEENT: normal. Neck: Supple, no JVD, carotid bruits, or masses. Cardiac: RRR, no murmurs, rubs, or gallops. No clubbing, cyanosis, edema.   Radials/DP/PT 2+ and equal bilaterally.  Respiratory:  Respirations regular and unlabored, clear to auscultation bilaterally. GI: Soft, nontender, nondistended, BS + x 4. MS: no deformity or atrophy. Skin: warm and dry, no rash. Neuro:  Strength and sensation are intact. Psych: Normal affect.  Accessory Clinical Findings    Recent Labs: 12/17/2022: NT-Pro BNP 1,790 06/26/2023: B Natriuretic Peptide 455.8 06/27/2023: Magnesium 1.9 06/28/2023: TSH 0.327 06/30/2023: ALT 16 07/01/2023: BUN 13; Creatinine, Ser 1.46; Hemoglobin 10.9; Platelets 269; Potassium 4.5; Sodium 136   Recent Lipid Panel    Component Value Date/Time   CHOL 82 06/27/2023 0919   CHOL 133 05/25/2022 1019   TRIG 84 06/27/2023 0919   HDL 25 (L) 06/27/2023 0919   HDL 33 (L) 05/25/2022 1019   CHOLHDL 3.3 06/27/2023 0919   VLDL 17 06/27/2023 0919   LDLCALC 40 06/27/2023 0919   LDLCALC 72 05/25/2022 1019   LDLDIRECT 66.5 07/21/2011 0926    No BP recorded.  {Refresh Note OR Click here to enter BP  :1}***    ECG personally reviewed by me today- ***          Assessment & Plan   1.  ***   [name]. Tracker Mance NP-C     08/17/2023, 7:51 AM [facility] [address] Office [phone] Fax [phone]    I spent***minutes examining this patient, reviewing medications, and using patient centered shared decision making involving her cardiac care.   I spent greater than 20 minutes reviewing her past medical history,  medications, and prior cardiac tests.

## 2023-08-18 LAB — LAB REPORT - SCANNED: EGFR: 78

## 2023-08-19 ENCOUNTER — Telehealth: Payer: Self-pay | Admitting: *Deleted

## 2023-08-19 NOTE — Telephone Encounter (Signed)
Reviewed procedure instructions with patient's daughter (DPR), Heather.

## 2023-08-19 NOTE — Telephone Encounter (Signed)
Patient's daughter is returning call.

## 2023-08-19 NOTE — Telephone Encounter (Addendum)
08/16/23 CBC/CMP results done at PCP have been faxed to [facility] Lab attn: [name] and same day fax [phone]. They are also available to view in Epic under  [facility] DXA tab.

## 2023-08-19 NOTE — Telephone Encounter (Addendum)
Cardiac Catheterization scheduled at [facility] for: Friday August 20, 2023 7:30 AM  Arrival time [facility] Main Entrance A at: 5:30 AM  Nothing to eat after midnight prior to procedure, clear liquids until 5 AM day of procedure.  Medication instructions: -Hold:  Eliquis-none 08/18/23 until post procedure -Other usual morning medications can be taken with sips of water including aspirin 81 mg.  Plan to go home the same day, you will only stay overnight if medically necessary.  You must have responsible adult to drive you home.  Someone must be with you the first 24 hours after you arrive home.  Reviewed procedure instructions with patient's daughter, [name].

## 2023-08-20 ENCOUNTER — Other Ambulatory Visit: Payer: Self-pay

## 2023-08-20 ENCOUNTER — Ambulatory Visit (HOSPITAL_COMMUNITY)
Admission: RE | Admit: 2023-08-20 | Discharge: 2023-08-20 | Disposition: A | Discharge status: Home / Self Care | Payer: Medicare PPO | Attending: Cardiology | Admitting: Cardiology

## 2023-08-20 ENCOUNTER — Ambulatory Visit (HOSPITAL_COMMUNITY)
Admission: RE | Disposition: A | Discharge status: Home / Self Care | Payer: Self-pay | Source: Home / Self Care | Attending: Cardiology

## 2023-08-20 DIAGNOSIS — I2510 Atherosclerotic heart disease of native coronary artery without angina pectoris: Secondary | ICD-10-CM | POA: Diagnosis present

## 2023-08-20 DIAGNOSIS — I130 Hypertensive heart and chronic kidney disease with heart failure and stage 1 through stage 4 chronic kidney disease, or unspecified chronic kidney disease: Secondary | ICD-10-CM | POA: Insufficient documentation

## 2023-08-20 DIAGNOSIS — I4821 Permanent atrial fibrillation: Secondary | ICD-10-CM | POA: Insufficient documentation

## 2023-08-20 DIAGNOSIS — N189 Chronic kidney disease, unspecified: Secondary | ICD-10-CM | POA: Diagnosis not present

## 2023-08-20 DIAGNOSIS — Z7901 Long term (current) use of anticoagulants: Secondary | ICD-10-CM | POA: Diagnosis not present

## 2023-08-20 DIAGNOSIS — I272 Pulmonary hypertension, unspecified: Secondary | ICD-10-CM | POA: Diagnosis not present

## 2023-08-20 DIAGNOSIS — I252 Old myocardial infarction: Secondary | ICD-10-CM | POA: Diagnosis not present

## 2023-08-20 DIAGNOSIS — Z955 Presence of coronary angioplasty implant and graft: Secondary | ICD-10-CM | POA: Insufficient documentation

## 2023-08-20 DIAGNOSIS — I5022 Chronic systolic (congestive) heart failure: Secondary | ICD-10-CM | POA: Insufficient documentation

## 2023-08-20 DIAGNOSIS — I255 Ischemic cardiomyopathy: Secondary | ICD-10-CM | POA: Diagnosis not present

## 2023-08-20 DIAGNOSIS — I25810 Atherosclerosis of coronary artery bypass graft(s) without angina pectoris: Secondary | ICD-10-CM

## 2023-08-20 HISTORY — PX: LEFT HEART CATH AND CORONARY ANGIOGRAPHY: CATH118249

## 2023-08-20 LAB — BASIC METABOLIC PANEL
Anion gap: 6 (ref 5–15)
BUN: 22 mg/dL (ref 8–23)
CO2: 24 mmol/L (ref 22–32)
Calcium: 8.5 mg/dL — ABNORMAL LOW (ref 8.9–10.3)
Chloride: 106 mmol/L (ref 98–111)
Creatinine, Ser: 0.91 mg/dL (ref 0.61–1.24)
GFR, Estimated: >60 mL/min (ref >60)
Glucose, Bld: 121 mg/dL — ABNORMAL HIGH (ref 70–99)
Potassium: 4.3 mmol/L (ref 3.5–5.1)
Sodium: 136 mmol/L (ref 135–145)

## 2023-08-20 LAB — CBC
HCT: 38 % — ABNORMAL LOW (ref 39.0–52.0)
Hemoglobin: 12.2 g/dL — ABNORMAL LOW (ref 13.0–17.0)
MCH: 31.1 pg (ref 26.0–34.0)
MCHC: 32.1 g/dL (ref 30.0–36.0)
MCV: 96.9 fL (ref 80.0–100.0)
Platelets: 304 10*3/uL (ref 150–400)
RBC: 3.92 MIL/uL — ABNORMAL LOW (ref 4.22–5.81)
RDW: 14 % (ref 11.5–15.5)
WBC: 10.5 10*3/uL (ref 4.0–10.5)
nRBC: 0 % (ref 0.0–0.2)

## 2023-08-20 SURGERY — LEFT HEART CATH AND CORONARY ANGIOGRAPHY
Anesthesia: LOCAL

## 2023-08-20 MED ORDER — SODIUM CHLORIDE 0.9 % IV SOLN: 250.0000 mL | INTRAVENOUS | Status: DC | PRN

## 2023-08-20 MED ORDER — IOHEXOL 350 MG/ML SOLN
INTRAVENOUS | Status: DC | PRN
  Administered 2023-08-20: 35 mL

## 2023-08-20 MED ORDER — ONDANSETRON HCL 4 MG/2ML IJ SOLN
INTRAMUSCULAR | Status: AC
  Filled 2023-08-20: qty 2

## 2023-08-20 MED ORDER — ACETAMINOPHEN 325 MG PO TABS
650.0000 mg | ORAL_TABLET | ORAL | Status: DC | PRN
  Administered 2023-08-20: 650 mg via ORAL

## 2023-08-20 MED ORDER — ACETAMINOPHEN 325 MG PO TABS
ORAL_TABLET | ORAL | Status: AC
  Filled 2023-08-20: qty 2

## 2023-08-20 MED ORDER — HEPARIN SODIUM (PORCINE) 1000 UNIT/ML IJ SOLN
INTRAMUSCULAR | Status: AC
  Filled 2023-08-20: qty 10

## 2023-08-20 MED ORDER — SODIUM CHLORIDE 0.9 % IV SOLN
INTRAVENOUS | Status: DC
Start: 2023-08-20 — End: 2023-08-20

## 2023-08-20 MED ORDER — ONDANSETRON HCL 4 MG/2ML IJ SOLN
4.0000 mg | Freq: Four times a day (QID) | INTRAMUSCULAR | Status: DC | PRN
  Administered 2023-08-20: 4 mg via INTRAVENOUS

## 2023-08-20 MED ORDER — SODIUM CHLORIDE 0.9% FLUSH: 10.0000 mL | Freq: Two times a day (BID) | INTRAVENOUS | Status: DC

## 2023-08-20 MED ORDER — LIDOCAINE HCL (PF) 1 % IJ SOLN
INTRAMUSCULAR | Status: AC
  Filled 2023-08-20: qty 30

## 2023-08-20 MED ORDER — HEPARIN SODIUM (PORCINE) 1000 UNIT/ML IJ SOLN
INTRAMUSCULAR | Status: DC | PRN
  Administered 2023-08-20: 5000 [IU] via INTRAVENOUS

## 2023-08-20 MED ORDER — SODIUM CHLORIDE 0.9% FLUSH
3.0000 mL | INTRAVENOUS | Status: DC | PRN
Start: 2023-08-20 — End: 2023-08-20

## 2023-08-20 MED ORDER — LIDOCAINE HCL (PF) 1 % IJ SOLN
INTRAMUSCULAR | Status: DC | PRN
  Administered 2023-08-20: 2 mL

## 2023-08-20 MED ORDER — HEPARIN (PORCINE) IN NACL 1000-0.9 UT/500ML-% IV SOLN
INTRAVENOUS | Status: DC | PRN
  Administered 2023-08-20 (×2): 500 mL

## 2023-08-20 MED ORDER — VERAPAMIL HCL 2.5 MG/ML IV SOLN
INTRAVENOUS | Status: AC
  Filled 2023-08-20: qty 2

## 2023-08-20 MED ORDER — MIDAZOLAM HCL 2 MG/2ML IJ SOLN
INTRAMUSCULAR | Status: AC
  Filled 2023-08-20: qty 2

## 2023-08-20 MED ORDER — VERAPAMIL HCL 2.5 MG/ML IV SOLN
INTRAVENOUS | Status: DC | PRN
  Administered 2023-08-20: 10 mL via INTRA_ARTERIAL

## 2023-08-20 MED ORDER — LABETALOL HCL 5 MG/ML IV SOLN: 10.0000 mg | INTRAVENOUS | Status: DC | PRN

## 2023-08-20 MED ORDER — FENTANYL CITRATE (PF) 100 MCG/2ML IJ SOLN
INTRAMUSCULAR | Status: AC
  Filled 2023-08-20: qty 2

## 2023-08-20 MED ORDER — HYDRALAZINE HCL 20 MG/ML IJ SOLN: 10.0000 mg | INTRAMUSCULAR | Status: DC | PRN

## 2023-08-20 MED ORDER — SODIUM CHLORIDE 0.9% FLUSH: 3.0000 mL | Freq: Two times a day (BID) | INTRAVENOUS | Status: DC

## 2023-08-20 MED ORDER — ASPIRIN 81 MG PO CHEW: 81.0000 mg | CHEWABLE_TABLET | ORAL | Status: DC

## 2023-08-20 SURGICAL SUPPLY — 11 items
CATH INFINITI 5 FR 3DRC (CATHETERS) IMPLANT
CATH INFINITI AMBI 5FR TG (CATHETERS) IMPLANT
CATH INFINITI JR4 5F (CATHETERS) IMPLANT
DEVICE RAD COMP TR BAND LRG (VASCULAR PRODUCTS) IMPLANT
GLIDESHEATH SLEND A-KIT 6F 22G (SHEATH) IMPLANT
GUIDEWIRE INQWIRE 1.5J.035X260 (WIRE) IMPLANT
INQWIRE 1.5J .035X260CM (WIRE) ×1
KIT SYRINGE INJ CVI SPIKEX1 (MISCELLANEOUS) IMPLANT
PACK CARDIAC CATHETERIZATION (CUSTOM PROCEDURE TRAY) ×1 IMPLANT
SET ATX-X65L (MISCELLANEOUS) IMPLANT
SHEATH PROBE COVER 6X72 (BAG) IMPLANT

## 2023-08-20 NOTE — H&P (Signed)
OV 08/17/2023 copied for documentation  Cardiology Office Note:    Date:  08/20/2023  ID:  [name], DOB Jan 21, 1941, MRN [id number] PCP: Kaleen Mask, MD  [facility] Providers Cardiologist:  Kristeen Miss, MD Electrophysiologist:  Sherryl Manges, MD       Patient Profile:      Coronary artery disease  S/p anterior STEMI in 2001 tx with PCI of LAD Cath 06/23/2004: LAD stents patent, D1 80-90 (small, not amenable to PCI) Myo PET : Lg ant and septal infarcts, no ischemia, EF 15, global myocardial blood flow reserve 1.83 (abnormal) (HFrEF) heart failure with reduced ejection fraction   Ischemic CM TTE 06/30/23: EF 25-30, ant and inf-sept DK, mod reduced RVSF, mod pulm HTN, RVSP 52.6, severe LAE, mod RAE, trivial MR, trivial AI, AV sclerosis, RAP 3  GDMT limited due to low BP Chronic Warfarin Rx  >> Apixaban  S/p CRT-D LV lead non-functional; device reprogrammed 05/2020 to allow LV pacing 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  [name] is a 82 y.o. male who returns to arrange cardiac catheterization for surgical clearance. He was admitted in 06/2023 with acute cholecystitis tx with IV Abxs and percutaneous drain. He was followed by cardiology. He was felt to be high risk for surgery and it was recommended that he undergo cardiac catheterization prior to any surgery. This could be set up as an outpatient after he recovered from his acute illness. He was last seen in clinic 07/20/23 by Jacolyn Reedy, PA-C. He had follow up with Dr. Dwain Sarna 12/5 who felt that  surgery is his best option. Dr. Elease Hashimoto has requested that the pt be set up for cardiac catheterization.         Review of Systems  Constitutional: Negative for fever.  Respiratory:  Positive for cough and sputum production.   Gastrointestinal:  Negative for hematochezia.  Genitourinary:  Negative for hematuria.  See HPI     Studies Reviewed:               Risk Assessment/Calculations:    CHA2DS2-VASc Score = 4   This indicates a 4.8% annual risk of stroke. The patient's score is based upon: CHF History: 1 HTN History: 0 Diabetes History: 0 Stroke History: 0 Vascular Disease History: 1 Age Score: 2 Gender Score: 0            Physical Exam:   VS:  BP 133/84   Pulse 68   Temp 97.9 F (36.6 C) (Oral)   Resp 20   Ht 5\' 10"  (1.778 m)   Wt 100.7 kg   SpO2 94%   BMI 31.85 kg/m    Wt Readings from Last 3 Encounters:  08/20/23 100.7 kg  08/17/23 101.6 kg  07/20/23 100.7 kg    Constitutional:      Appearance: Healthy appearance. Not in distress.  Neck:     Vascular: No JVR. JVD normal.  Pulmonary:     Breath sounds: Scattered Wheezing (faint, insp - vs upper airway noise) present. No rales.  Cardiovascular:     Normal  rate. Irregularly irregular rhythm.     Murmurs: There is no murmur.  Edema:    Peripheral edema present.    Pretibial: 1+ edema of the left pretibial area and 2+ edema of the right pretibial area. Abdominal:     Palpations: Abdomen is soft.  Skin:    General: Skin is warm and dry.        Assessment and Plan:   Assessment & Plan      Informed Consent   Shared Decision Making/Informed Consent The risks [stroke (1 in 1000), death (1 in 1000), kidney failure [usually temporary] (1 in 500), bleeding (1 in 200), allergic reaction [possibly serious] (1 in 200)], benefits (diagnostic support and management of coronary artery disease) and alternatives of a cardiac catheterization were discussed in detail with Mr. Arace and he is willing to  proceed.     Dispo:  No follow-ups on file.  Signed, Elder Negus, MD

## 2023-08-20 NOTE — Interval H&P Note (Signed)
History and Physical Interval Note:  08/20/2023 7:38 AM  [name]  has presented today for surgery, with the diagnosis of cad.  The various methods of treatment have been discussed with the patient and family. After consideration of risks, benefits and other options for treatment, the patient has consented to  Procedure(s): LEFT HEART CATH AND CORONARY ANGIOGRAPHY (N/A) as a surgical intervention.  The patient's history has been reviewed, patient examined, no change in status, stable for surgery.  I have reviewed the patient's chart and labs.  Questions were answered to the patient's satisfaction.     Jolleen Seman J Azeem Poorman

## 2023-08-20 NOTE — Discharge Instructions (Signed)
Radial Site Care  This sheet gives you information about how to care for yourself after your procedure. Your health care provider may also give you more specific instructions. If you have problems or questions, contact your health care provider. What can I expect after the procedure? After the procedure, it is common to have: Bruising and tenderness at the catheter insertion area. Follow these instructions at home: Medicines Take over-the-counter and prescription medicines only as told by your health care provider. Insertion site care Follow instructions from your health care provider about how to take care of your insertion site. Make sure you: Wash your hands with soap and water before you remove your bandage (dressing). If soap and water are not available, use hand sanitizer. May remove dressing in 24 hours. Check your insertion site every day for signs of infection. Check for: Redness, swelling, or pain. Fluid or blood. Pus or a bad smell. Warmth. Do no take baths, swim, or use a hot tub for 5 days. You may shower 24-48 hours after the procedure. Remove the dressing and gently wash the site with plain soap and water. Pat the area dry with a clean towel. Do not rub the site. That could cause bleeding. Do not apply powder or lotion to the site. Activity  For 24 hours after the procedure, or as directed by your health care provider: Do not flex or bend the affected arm. Do not push or pull heavy objects with the affected arm. Do not drive yourself home from the hospital or clinic. You may drive 24 hours after the procedure. Do not operate machinery or power tools. KEEP ARM ELEVATED THE REMAINDER OF THE DAY. Do not push, pull or lift anything that is heavier than 10 lb for 5 days. Ask your health care provider when it is okay to: Return to work or school. Resume usual physical activities or sports. Resume sexual activity. General instructions If the catheter site starts to  bleed, raise your arm and put firm pressure on the site. If the bleeding does not stop, get help right away. This is a medical emergency. DRINK PLENTY OF FLUIDS FOR THE NEXT 2-3 DAYS. No alcohol consumption for 24 hours after receiving sedation. If you went home on the same day as your procedure, a responsible adult should be with you for the first 24 hours after you arrive home. Keep all follow-up visits as told by your health care provider. This is important. Contact a health care provider if: You have a fever. You have redness, swelling, or yellow drainage around your insertion site. Get help right away if: You have unusual pain at the radial site. The catheter insertion area swells very fast. The insertion area is bleeding, and the bleeding does not stop when you hold steady pressure on the area. Your arm or hand becomes pale, cool, tingly, or numb. These symptoms may represent a serious problem that is an emergency. Do not wait to see if the symptoms will go away. Get medical help right away. Call your local emergency services (911 in the [place] Do not drive yourself to the hospital. Summary After the procedure, it is common to have bruising and tenderness at the site. Follow instructions from your health care provider about how to take care of your radial site wound. Check the wound every day for signs of infection.  This information is not intended to replace advice given to you by your health care provider. Make sure you discuss any questions you have with   your health care provider. Document Revised: 09/29/2017 Document Reviewed: 09/29/2017 Elsevier Patient Education  2020 [employer]  

## 2023-08-23 ENCOUNTER — Other Ambulatory Visit: Payer: Self-pay | Admitting: General Surgery

## 2023-08-23 ENCOUNTER — Encounter (HOSPITAL_COMMUNITY): Payer: Self-pay | Admitting: Cardiology

## 2023-08-23 DIAGNOSIS — K819 Cholecystitis, unspecified: Secondary | ICD-10-CM

## 2023-08-24 ENCOUNTER — Ambulatory Visit: Payer: Medicare PPO | Admitting: General Practice

## 2023-08-24 MED FILL — Fentanyl Citrate Preservative Free (PF) Inj 100 MCG/2ML: INTRAMUSCULAR | Qty: 2 | Status: AC

## 2023-08-24 MED FILL — Midazolam HCl Inj 2 MG/2ML (Base Equivalent): INTRAMUSCULAR | Qty: 2 | Status: AC

## 2023-08-26 NOTE — Progress Notes (Signed)
Chief Complaint: Patient was seen in consultation today for cholelithiasis  Referring Physician(s): Wakefield,Matthew  History of Present Illness: [name] is an 82 y.o. male with a medical history significant for CAD, CHF, MI s/p PCI, atrial fibrillation on Eliquis, DM, chronic kidney disease, HTN and V fib with ICD (s/p removal 06/01/23 at outside hospital due to infection. He presented to the [facility] ED 06/26/23 with abdominal pain and was found to have acute calculous cholecystitis. He was not considered a candidate for surgery due to his cardiology status and IR was consulted for a percutaneous cholecystostomy. The drain was placed 06/29/23 and he was discharged home 07/01/23. He followed up with IR 08/11/23 for a cholangiogram with drain exchange. This study showed patent appearing cystic and common bile ducts with a few small filling defects in the gallbladder body suggestive of cholelithiasis.   He followed up with Dr. Dwain Sarna 08/12/23 and they discussed potential surgery pending cardiac clearance.  A left heart catheterization with angiography was performed 08/20/23 and his perioperative cardiac risk was considered elevated because of ischemic cardiomyopathy with an EF of 25-30% on recent echocardiogram.  The patient has been kindly referred to Interventional Radiology to discuss percutaneous gallstone retrieval. The patient is very motivated to get the drain removed as it causes him significant discomfort.  No recent fevers or chills.  Normal bilious drainage into bag with exception of some trace blood seen after recent cardiac cath.  Past Medical History:  Diagnosis Date   AICD (automatic cardioverter/defibrillator) present    Arthritis    knees, back    BPH (benign prostatic hypertrophy)    CHF (congestive heart failure) (HCC)    Chronic kidney disease    BPH   Chronic systolic heart failure (HCC)    a. s/p MDT single chamber ICD 2004 as part of MASTER study b.  upgrade to CRTD 2010; CRTD gen change 2016   Coronary artery disease    a. s/p anterior MI and CYPHER stent to LAD 2005   Dyslipidemia    Dysrhythmia    a-fib   Gout    Ischemic cardiomyopathy    Paroxysmal atrial fibrillation (HCC)    Ventricular tachycardia (HCC)     Past Surgical History:  Procedure Laterality Date   ANAL RECTAL MANOMETRY N/A 06/23/2023   Procedure: ANO RECTAL MANOMETRY;  Surgeon: Napoleon Form, MD;  Location: [facility] ENDOSCOPY;  Service: Gastroenterology;  Laterality: N/A;   APPENDECTOMY  1970   BI-VENTRICULAR IMPLANTABLE CARDIOVERTER DEFIBRILLATOR UPGRADE N/A 11/07/2014   a. MDT single chamber ICD implanted 2004 as part of MASTER study; upgrade to CRTD 2010; gen change 2016   BIV ICD GENERATOR CHANGEOUT N/A 11/12/2021   Procedure: BIV ICD GENERATOR CHANGEOUT;  Surgeon: Duke Salvia, MD;  Location: [facility] INVASIVE CV LAB;  Service: Cardiovascular;  Laterality: N/A;   CARDIAC CATHETERIZATION  09/08/2003   x3 total of 5 stents   CARDIOVERSION N/A 07/04/2019   Procedure: CARDIOVERSION;  Surgeon: Wendall Stade, MD;  Location: [facility] ENDOSCOPY;  Service: Cardiovascular;  Laterality: N/A;   CARDIOVERSION N/A 08/17/2019   Procedure: CARDIOVERSION;  Surgeon: Vesta Mixer, MD;  Location: [facility] ENDOSCOPY;  Service: Cardiovascular;  Laterality: N/A;   CARDIOVERSION N/A 05/04/2022   Procedure: CARDIOVERSION;  Surgeon: Quintella Reichert, MD;  Location: [facility] ENDOSCOPY;  Service: Cardiovascular;  Laterality: N/A;   IR EXCHANGE BILIARY DRAIN  08/11/2023   IR PERC CHOLECYSTOSTOMY  06/29/2023   LEFT HEART CATH AND CORONARY ANGIOGRAPHY N/A 08/20/2023  Procedure: LEFT HEART CATH AND CORONARY ANGIOGRAPHY;  Surgeon: Elder Negus, MD;  Location: [facility] INVASIVE CV LAB;  Service: Cardiovascular;  Laterality: N/A;   PROSTATECTOMY  11/06/2011   Procedure: PROSTATECTOMY SUPRAPUBIC;  Surgeon: Kathi Ludwig, MD;  Location: [facility] ORS;  Service: Urology;  Laterality: N/A;  Open Suprapubic  Prostatectomy   TEE WITHOUT CARDIOVERSION N/A 05/16/2019   Procedure: TRANSESOPHAGEAL ECHOCARDIOGRAM (TEE);  Surgeon: Sande Rives, MD;  Location: [facility] ENDOSCOPY;  Service: Cardiology;  Laterality: N/A;   TOTAL KNEE ARTHROPLASTY Left 10/06/2021   Procedure: TOTAL KNEE ARTHROPLASTY;  Surgeon: Jodi Geralds, MD;  Location: [facility] ORS;  Service: Orthopedics;  Laterality: Left;    Allergies: Lipitor [atorvastatin] and Jardiance [empagliflozin]  Medications: Prior to Admission medications   Medication Sig Start Date End Date Taking? Authorizing Provider  acetaminophen (TYLENOL) 325 MG tablet Take 2 tablets (650 mg total) by mouth every 4 (four) hours as needed for headache or mild pain. 05/27/23   Sheilah Pigeon, PA-C  albuterol (VENTOLIN HFA) 108 (90 Base) MCG/ACT inhaler Inhale 2 puffs into the lungs every 6 (six) hours as needed for wheezing or shortness of breath. 08/16/23   [provider]  allopurinol (ZYLOPRIM) 100 MG tablet Take 100 mg by mouth at bedtime. 05/14/15   [provider]  apixaban (ELIQUIS) 5 MG TABS tablet Take 5 mg by mouth 2 (two) times daily.    [provider]  carvedilol (COREG) 3.125 MG tablet TAKE 1 TABLET TWICE DAILY 07/29/23   Nahser, Deloris Ping, MD  chlorpheniramine-HYDROcodone (TUSSIONEX) 10-8 MG/5ML Take 5 mLs by mouth every 12 (twelve) hours as needed. 08/16/23   [provider]  doxycycline (VIBRAMYCIN) 100 MG capsule Take 100 mg by mouth 2 (two) times daily. 08/16/23   [provider]  latanoprost (XALATAN) 0.005 % ophthalmic solution Place 1 drop into both eyes at bedtime.  10/03/13   [provider]  levothyroxine (SYNTHROID) 125 MCG tablet Take 1 tablet (125 mcg total) by mouth daily before breakfast. 08/03/23   Nahser, Deloris Ping, MD  methylPREDNISolone (MEDROL DOSEPAK) 4 MG TBPK tablet Take 4 mg by mouth. 08/16/23   [provider]  nitroGLYCERIN (NITROSTAT) 0.4 MG SL tablet Place 1 tablet (0.4 mg total)  under the tongue every 5 (five) minutes as needed for chest pain. 12/12/19   Nahser, Deloris Ping, MD  rosuvastatin (CRESTOR) 10 MG tablet TAKE 1/2 TABLET EVERY DAY 03/24/23   Nahser, Deloris Ping, MD  sodium chloride flush (NS) 0.9 % SOLN Flush gallbladder drain once daily with at least 5 ml normal saline. Discard extra 5ml after use of the 10ml syringe 06/30/23   Mickie Kay, NP     Family History  Problem Relation Age of Onset   Heart disease Sister        3 19f the 4 had HD   Heart disease Brother        2 of 3 had HD   Heart disease Sister        1 of the 6 has HD   Heart disease Brother     Social History   Socioeconomic History   Marital status: Married    Spouse name: Not on file   Number of children: Not on file   Years of education: Not on file   Highest education level: Not on file  Occupational History   Not on file  Tobacco Use   Smoking status: Never    Passive exposure: Never   Smokeless  tobacco: Never  Vaping Use   Vaping status: Never Used  Substance and Sexual Activity   Alcohol use: No    Comment: occasional beer    Drug use: No   Sexual activity: Not on file  Other Topics Concern   Not on file  Social History Narrative   Not on file   Social Drivers of Health   Financial Resource Strain: Not on file  Food Insecurity: No Food Insecurity (06/26/2023)   Hunger Vital Sign    Worried About Running Out of Food in the Last Year: Never true    Ran Out of Food in the Last Year: Never true  Transportation Needs: No Transportation Needs (06/26/2023)   PRAPARE - Administrator, Civil Service (Medical): No    Lack of Transportation (Non-Medical): No  Physical Activity: Not on file  Stress: Not on file  Social Connections: Not on file   Review of Systems: A 12 point ROS discussed and pertinent positives are indicated in the HPI above.  All other systems are negative.  Vital Signs: There were no vitals taken for this visit.  Advance Care Plan:  The advanced care plan/surrogate decision maker was discussed at the time of visit and documented in the medical record.    Physical Exam Constitutional:      General: He is not in acute distress. HENT:     Head: Normocephalic.     Mouth/Throat:     Mouth: Mucous membranes are moist.  Eyes:     General: No scleral icterus. Cardiovascular:     Rate and Rhythm: Normal rate and regular rhythm.  Pulmonary:     Effort: Pulmonary effort is normal. No respiratory distress.  Abdominal:     General: There is no distension.     Comments: RUQ cholecystostomy in place  Skin:    General: Skin is warm and dry.     Coloration: Skin is not jaundiced.  Neurological:     Mental Status: He is alert and oriented to person, place, and time.     Imaging: 08/11/23    Labs:  CBC: Recent Labs    06/28/23 0828 06/30/23 0536 07/01/23 0803 08/20/23 0627  WBC 6.0 6.1 8.6 10.5  HGB 10.7* 11.0* 10.9* 12.2*  HCT 33.6* 33.6* 33.2* 38.0*  PLT 255 265 269 304    COAGS: Recent Labs    06/27/23 0919 06/27/23 1904 06/28/23 0828 06/28/23 1811 06/28/23 2349  INR  --   --   --   --  1.2  APTT 40* 137* 152* 78*  --     BMP: Recent Labs    06/28/23 0828 06/30/23 0536 07/01/23 0804 08/20/23 0627  NA 137 135 136 136  K 4.0 4.0 4.5 4.3  CL 104 102 104 106  CO2 24 24 23 24   GLUCOSE 109* 95 252* 121*  BUN 16 13 13 22   CALCIUM 8.7* 8.6* 8.9 8.5*  CREATININE 1.19 1.43* 1.46* 0.91  GFRNONAA >60 49* 47* >60    LIVER FUNCTION TESTS: Recent Labs    06/26/23 0857 06/27/23 0919 06/30/23 0536  BILITOT 0.9 1.1 1.2  AST 23 22 21   ALT 18 16 16   ALKPHOS 76 87 80  PROT 6.0* 6.0* 6.2*  ALBUMIN 3.0* 2.9* 3.0*    TUMOR MARKERS: No results for input(s): "AFPTM", "CEA", "CA199", "CHROMGRNA" in the last 8760 hours.  Assessment and Plan: 82 year old male with a history of acute calculous cholecystitis status post percutaneous cholecystostomy 06/29/23. He is not  a candidate for  cholecystectomy due to his cardiac issues. He is an excellent candidate for choledochoscopic guided percutaneous gallstone retrieval in hopes of eventual drain removal.  We discussed the rationale, periprocedural expectations, risks, benefits, and long term success rate of getting the drain out.  He would like to proceed.  Plan for choledochoscopic-assisted percutaneous gallstone retrieval at [facility] with moderate sedation.  Please hold Eliquis for 2 days prior to procedure.  Routine biliary antibiotic prophylaxis.   Marliss Coots, MD Pager: [phone]    I spent a total of  40 Minutes   in face to face in clinical consultation, greater than 50% of which was counseling/coordinating care for cholelithiasis.

## 2023-08-27 ENCOUNTER — Ambulatory Visit
Admission: RE | Admit: 2023-08-27 | Discharge: 2023-08-27 | Disposition: A | Discharge status: Home / Self Care | Payer: Medicare PPO | Source: Ambulatory Visit | Attending: General Surgery | Admitting: General Surgery

## 2023-08-27 ENCOUNTER — Telehealth: Payer: Self-pay

## 2023-08-27 DIAGNOSIS — K819 Cholecystitis, unspecified: Secondary | ICD-10-CM

## 2023-08-27 HISTORY — PX: IR RADIOLOGIST EVAL & MGMT: IMG5224

## 2023-08-27 NOTE — Telephone Encounter (Signed)
   Pre-operative Risk Assessment    Patient Name: [name]  DOB: 31-Aug-1941 MRN: [id number]   LOV: 08/17/23 - SCOTT WEAVER, PA-C NOV: 09/03/23 - SCOTT WEAVER, PA-C    Request for Surgical Clearance    Procedure:   [id number] PROCEDURE   Date of Surgery:  Clearance TBD                                 Surgeon:  NONE INDICATED Surgeon's Group or Practice Name:   [facility]  Phone number:  [phone] Fax number:  [phone]   Type of Clearance Requested:   - Pharmacy:  Hold Apixaban (Eliquis) X 2 DAYS PRIOR    Type of Anesthesia:  Not Indicated   Additional requests/questions:    SignedMichaelle Copas   08/27/2023, 4:44 PM

## 2023-08-27 NOTE — Telephone Encounter (Signed)
Pharmacy please advise on holding Eliquis prior to spyglass procedure scheduled for TBD. Thank you.

## 2023-08-30 ENCOUNTER — Other Ambulatory Visit (HOSPITAL_COMMUNITY): Payer: Self-pay | Admitting: Interventional Radiology

## 2023-08-30 DIAGNOSIS — K8020 Calculus of gallbladder without cholecystitis without obstruction: Secondary | ICD-10-CM

## 2023-08-30 DIAGNOSIS — K819 Cholecystitis, unspecified: Secondary | ICD-10-CM

## 2023-08-30 NOTE — Telephone Encounter (Signed)
Patient with diagnosis of atrial fibrillation on Eliquis for anticoagulation.    Procedure:   SPYGLASS PROCEDURE    Date of Surgery:  Clearance TBD   CHA2DS2-VASc Score = 6   This indicates a 9.7% annual risk of stroke. The patient's score is based upon: CHF History: 1 HTN History: 1 Diabetes History: 1 Stroke History: 0 Vascular Disease History: 1 Age Score: 2 Gender Score: 0    CrCl 88 Platelet count 304  Per office protocol, patient can hold Eliquis for 2 days prior to procedure.   Patient will not need bridging with Lovenox (enoxaparin) around procedure.  **This guidance is not considered finalized until pre-operative APP has relayed final recommendations.**

## 2023-08-30 NOTE — Telephone Encounter (Signed)
   Patient Name: [name]  DOB: 02-Jul-1941 MRN: [id number]  Primary Cardiologist: Kristeen Miss, MD  Clinical pharmacists have reviewed the patient's past medical history, labs, and current medications as part of preoperative protocol coverage. The following recommendations have been made:  Per office protocol, patient can hold Eliquis for 2 days prior to procedure.   Patient will not need bridging with Lovenox (enoxaparin) around procedure.  I will route this recommendation to the requesting party via Epic fax function and remove from pre-op pool.  Please call with questions.  Denyce Robert, NP 08/30/2023, 3:02 PM

## 2023-09-02 DIAGNOSIS — I5020 Unspecified systolic (congestive) heart failure: Secondary | ICD-10-CM | POA: Insufficient documentation

## 2023-09-02 NOTE — Progress Notes (Signed)
Cardiology Office Note:    Date:  09/03/2023  ID:  [name], DOB Jul 17, 1941, MRN [id number] PCP: Kaleen Mask, MD  [facility] Providers Cardiologist:  Kristeen Miss, MD Electrophysiologist:  Sherryl Manges, MD       Patient Profile:      Coronary artery disease  S/p anterior STEMI in 2001 tx with PCI of LAD Cath 06/23/2004: LAD stents patent, D1 80-90 (small, not amenable to PCI) Myo PET : Lg ant and septal infarcts, no ischemia, EF 15, global myocardial blood flow reserve 1.83 (abnormal) Cath 08/20/23: oLAD stent 90 ISR, mLAD 30, dLAD diffuse 40; pOM2 30; RCA irregs>>med Rx (HFrEF) heart failure with reduced ejection fraction   Ischemic CM TTE 06/30/23: EF 25-30, ant and inf-sept DK, mod reduced RVSF, mod pulm HTN, RVSP 52.6, severe LAE, mod RAE, trivial MR, trivial AI, AV sclerosis, RAP 3  GDMT limited due to low BP Chronic Warfarin Rx  >> Apixaban  S/p CRT-D LV lead non-functional; device reprogrammed 05/2020 to allow LV pacing 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  [name] is a 82 y.o. male who returns for post cath follow up. He was last seen 08/17/23. He had been admitted in 06/2023 for cholecystitis tx with perc drain. Cath was recommended to assess risk prior to proceeding with cholecystectomy. This was done 12/13 and demonstrated LAD with 90% in stent restenosis. PET in 07/2022 showed large anterior and septal infarcts. It was felt that he already infarcted the anterior myocardium and was not at risk for re-infarction. PCI of the LAD would  not mitigate perioperative risk. Med Rx was recommended.  He is here with his daughter.  He did not feel well for couple of days after his cardiac catheterization.  He feels back to normal now.  He was placed back on furosemide and potassium every other day.  He does have dyspnea with mild to moderate activities.  This is overall stable.  He has not had orthopnea, PND.  Lower extremity edema has improved since resuming furosemide and potassium.  He has not had syncope.     ROS   See HPI    Studies Reviewed:   EKG Interpretation Date/Time:  Friday September 03 2023 09:52:55 EST Ventricular Rate:  72 PR Interval:    QRS Duration:  154 QT Interval:  430 QTC Calculation: 470 R Axis:   247  Text Interpretation: Atrial fibrillation Right superior axis deviation Left bundle branch block No significant change since last tracing Confirmed by Tereso Newcomer [phone]) on 09/03/2023 10:03:34 AM             Risk Assessment/Calculations:    CHA2DS2-VASc Score = 6   This indicates a 9.7% annual risk of stroke. The patient's score is based upon: CHF History: 1 HTN History: 1 Diabetes History: 1 Stroke History: 0 Vascular Disease History: 1 Age Score: 2 Gender Score: 0            Physical Exam:   VS:  BP (!) 100/50   Pulse 72   Ht 5\' 10"  (1.778 m)  Wt 217 lb (98.4 kg)   SpO2 94%   BMI 31.14 kg/m    Wt Readings from Last 3 Encounters:  09/03/23 217 lb (98.4 kg)  08/20/23 222 lb (100.7 kg)  08/17/23 224 lb (101.6 kg)    Constitutional:      Appearance: Healthy appearance. Not in distress.  Neck:     Vascular: JVD normal.  Pulmonary:     Breath sounds: No wheezing (faint, insp - vs upper airway noise). No rales.  Cardiovascular:     Normal rate. Irregularly irregular rhythm.     Murmurs: There is no murmur.  Edema:    Peripheral edema present.    Pretibial: bilateral trace edema of the pretibial area. Abdominal:     Palpations: Abdomen is soft.        Assessment and Plan:    Assessment & Plan Coronary artery disease involving coronary bypass graft of native heart without angina pectoris History of anterior STEMI in 2001 treated PCI of LAD.  Myocardial PET November 2023 was high risk with large anterior and septal infarcts but no ischemia.  He was admitted in October 2024 with cholecystitis treated with percutaneous drain.  Cardiac catheterization was recommended for restratification.  This was recently done and demonstrated 90% in-stent restenosis of the stent in the LAD.  He had mild to moderate nonobstructive disease elsewhere.  It was felt that his anterior myocardium was already infarcted and PCI of this vessel would not improve outcomes.  He is not at risk for reinfarcting this area.  Medical therapy was recommended.  He is not having chest symptoms to suggest angina.  He is not on antiplatelet therapy as he is on Eliquis.  Continue nitroglycerin as needed, Crestor 10 mg daily.  Follow-up in February as planned. HFrEF (heart failure with reduced ejection fraction) (HCC) EF 25-30 by echocardiogram October 2024.  His LVEDP was somewhat elevated at the time of cardiac catheterization.  Since then, he has been placed back on furosemide 40 mg and potassium 10 mEq every other day.  His weight is down 5 pounds.  His breathing has improved as well as his lower extremity edema.  Continue furosemide 40 mg every other day along with potassium 10 mEq every other day.  Obtain follow-up BMET today. Preoperative cardiovascular examination As noted, he will be high risk for any procedure.  Instead of undergoing cholecystectomy, the plan is now to undergo procedure with interventional radiology.  I have reviewed his case with Dr. Elease Hashimoto.  He agrees that the patient can proceed with his gallbladder procedure at high risk for perioperative CV complications.  Eliquis can be held for 1 to 2 days prior to his procedure, if needed.  It should be resumed postop as soon as possible. Stage 3a  chronic kidney disease (HCC) Recent creatinine stable.  Obtain follow-up BMET today.     Dispo:  Return in 7 weeks (on 10/20/2023) for Scheduled Follow Up, w/ Dr. Elease Hashimoto.  Signed, Tereso Newcomer, PA-C

## 2023-09-03 ENCOUNTER — Ambulatory Visit: Payer: Medicare PPO | Attending: Physician Assistant | Admitting: Physician Assistant

## 2023-09-03 ENCOUNTER — Encounter: Payer: Self-pay | Admitting: Physician Assistant

## 2023-09-03 VITALS — BP 100/50 | HR 72 | Ht 70.0 in | Wt 217.0 lb

## 2023-09-03 DIAGNOSIS — I5020 Unspecified systolic (congestive) heart failure: Secondary | ICD-10-CM

## 2023-09-03 DIAGNOSIS — N1831 Chronic kidney disease, stage 3a: Secondary | ICD-10-CM | POA: Diagnosis not present

## 2023-09-03 DIAGNOSIS — Z01810 Encounter for preprocedural cardiovascular examination: Secondary | ICD-10-CM

## 2023-09-03 DIAGNOSIS — I25810 Atherosclerosis of coronary artery bypass graft(s) without angina pectoris: Secondary | ICD-10-CM | POA: Diagnosis not present

## 2023-09-03 LAB — BASIC METABOLIC PANEL
BUN/Creatinine Ratio: 18 (ref 10–24)
BUN: 19 mg/dL (ref 8–27)
CO2: 23 mmol/L (ref 20–29)
Calcium: 9.8 mg/dL (ref 8.6–10.2)
Chloride: 104 mmol/L (ref 96–106)
Creatinine, Ser: 1.06 mg/dL (ref 0.76–1.27)
Glucose: 135 mg/dL — ABNORMAL HIGH (ref 70–99)
Potassium: 4.7 mmol/L (ref 3.5–5.2)
Sodium: 144 mmol/L (ref 134–144)
eGFR: 70 mL/min/{1.73_m2} (ref >59)

## 2023-09-03 NOTE — Assessment & Plan Note (Signed)
History of anterior STEMI in 2001 treated PCI of LAD.  Myocardial PET November 2023 was high risk with large anterior and septal infarcts but no ischemia.  He was admitted in October 2024 with cholecystitis treated with percutaneous drain.  Cardiac catheterization was recommended for restratification.  This was recently done and demonstrated 90% in-stent restenosis of the stent in the LAD.  He had mild to moderate nonobstructive disease elsewhere.  It was felt that his anterior myocardium was already infarcted and PCI of this vessel would not improve outcomes.  He is not at risk for reinfarcting this area.  Medical therapy was recommended.  He is not having chest symptoms to suggest angina.  He is not on antiplatelet therapy as he is on Eliquis.  Continue nitroglycerin as needed, Crestor 10 mg daily.  Follow-up in February as planned.

## 2023-09-03 NOTE — Patient Instructions (Addendum)
Medication Instructions:  Your physician recommends that you continue on your current medications as directed. Please refer to the Current Medication list given to you today. UPDATED medication list to include: Furosemide (Lasix) 40 mg by mouth every other day Potassium Chloride 10 mEq by mouth every other day  *If you need a refill on your cardiac medications before your next appointment, please call your pharmacy*   Lab Work: BMP  If you have labs (blood work) drawn today and your tests are completely normal, you will receive your results only by: MyChart Message (if you have MyChart) OR A paper copy in the mail If you have any lab test that is abnormal or we need to change your treatment, we will call you to review the results.   Testing/Procedures: NONE   Follow-Up:As scheduled  At [facility], you and your health needs are our priority.  As part of our continuing mission to provide you with exceptional heart care, we have created designated Provider Care Teams.  These Care Teams include your primary Cardiologist (physician) and Advanced Practice Providers (APPs -  Physician Assistants and Nurse Practitioners) who all work together to provide you with the care you need, when you need it.    Provider:   Kristeen Miss, MD     Other Instructions You can HOLD Eliquis 1-2 Days prior to Gallbladder Surgery.

## 2023-09-03 NOTE — Assessment & Plan Note (Signed)
Recent creatinine stable.  Obtain follow-up BMET today.

## 2023-09-03 NOTE — Assessment & Plan Note (Signed)
EF 25-30 by echocardiogram October 2024.  His LVEDP was somewhat elevated at the time of cardiac catheterization.  Since then, he has been placed back on furosemide 40 mg and potassium 10 mEq every other day.  His weight is down 5 pounds.  His breathing has improved as well as his lower extremity edema.  Continue furosemide 40 mg every other day along with potassium 10 mEq every other day.  Obtain follow-up BMET today.

## 2023-09-06 ENCOUNTER — Ambulatory Visit (INDEPENDENT_AMBULATORY_CARE_PROVIDER_SITE_OTHER): Payer: Medicare PPO | Admitting: Otolaryngology

## 2023-09-06 ENCOUNTER — Encounter (INDEPENDENT_AMBULATORY_CARE_PROVIDER_SITE_OTHER): Payer: Self-pay

## 2023-09-06 VITALS — BP 109/69 | HR 80 | Ht 70.0 in | Wt 215.0 lb

## 2023-09-06 DIAGNOSIS — H95121 Granulation of postmastoidectomy cavity, right ear: Secondary | ICD-10-CM

## 2023-09-06 DIAGNOSIS — J343 Hypertrophy of nasal turbinates: Secondary | ICD-10-CM | POA: Diagnosis not present

## 2023-09-06 DIAGNOSIS — J342 Deviated nasal septum: Secondary | ICD-10-CM

## 2023-09-06 DIAGNOSIS — J310 Chronic rhinitis: Secondary | ICD-10-CM | POA: Diagnosis not present

## 2023-09-06 DIAGNOSIS — R0981 Nasal congestion: Secondary | ICD-10-CM

## 2023-09-07 DIAGNOSIS — J342 Deviated nasal septum: Secondary | ICD-10-CM | POA: Insufficient documentation

## 2023-09-07 DIAGNOSIS — J343 Hypertrophy of nasal turbinates: Secondary | ICD-10-CM | POA: Insufficient documentation

## 2023-09-07 DIAGNOSIS — H95121 Granulation of postmastoidectomy cavity, right ear: Secondary | ICD-10-CM | POA: Insufficient documentation

## 2023-09-07 DIAGNOSIS — J310 Chronic rhinitis: Secondary | ICD-10-CM | POA: Insufficient documentation

## 2023-09-07 NOTE — Progress Notes (Signed)
Patient ID: [name], male   DOB: 07-11-1941, 82 y.o.   MRN: [id number]  Follow-up: Chronic nasal congestion, right mastoid drainage  HPI: The patient is an 82 year old male who returns today for his follow-up evaluation.  He was previously seen for chronic nasal congestion, secondary to nasal septal deviation and bilateral inferior turbinate hypertrophy.  He was treated with Flonase nasal spray.  In addition, he also has a history of right chronic mastoiditis, and was previously treated with right canal wall down tympanomastoidectomy surgery.  He has been returning on a regular basis for right mastoid debridement.  According to the patient, he has noted right ear drainage approximately 2 weeks ago.  The drainage has decreased over the past week.  He denies any otalgia or significant change in his hearing.  He has a history of profound right ear hearing loss.  He also has left ear high-frequency sensorineural hearing loss, likely secondary to presbycusis.  His nasal congestion is currently under control with the use of Flonase.  He denies any facial pain, fever, or visual change.  Exam: General: Communicates without difficulty, well nourished, no acute distress. Head: Normocephalic, no evidence injury, no tenderness, facial buttresses intact without stepoff. Face/sinus: No tenderness to palpation and percussion. Facial movement is normal and symmetric. Eyes: PERRL, EOMI. No scleral icterus, conjunctivae clear. Neuro: CN II exam reveals vision grossly intact.  No nystagmus at any point of gaze. Ears: Auricles well formed without lesions.  A large right mastoid bowl is noted.  A significant amount of squamous debris and granulation tissue are noted within the right mastoid cavity.  No acute infection is noted.  The left ear canal and tympanic membrane are normal.  Nose: External evaluation reveals normal support and skin without lesions.  Dorsum is intact.  Anterior rhinoscopy reveals congested mucosa over  anterior aspect of inferior turbinates and deviated septum.  No purulence noted. Oral:  Oral cavity and oropharynx are intact, symmetric, without erythema or edema.  Mucosa is moist without lesions. Neck: Full range of motion without pain.  There is no significant lymphadenopathy.  No masses palpable.  Thyroid bed within normal limits to palpation.  Parotid glands and submandibular glands equal bilaterally without mass.  Trachea is midline. Neuro:  CN 2-12 grossly intact.    Procedure: Debridement of the right mastoid cavity Anesthesia: None Description: The patient is placed supine on the exam table. Under the operating microscope, the right ear is examined. A large mastoid bowl is noted. A significant amount of squamous debris and granulation tissue are noted within the mastoid bowl. Under the operating microscope, the squamous debris and granulation tissue are extensively and carefully removed with a combination of suction catheters, cerumen curette, and alligator forceps. After the debridement procedure, the mastoid bowl is noted to be mildly inflamed. The patient tolerated the procedure well.   Assessment: 1.  Chronic rhinitis with nasal mucosal congestion, nasal septal deviation, and bilateral inferior turbinate hypertrophy. 2.  Chronic right mastoiditis with recent drainage.  A large amount of squamous debris and granulation tissue are noted within the right mastoid cavity.  After the debridement procedure, no cholesteatoma or acute infection is noted.  Plan: 1.  Otomicroscopy with debridement of the right mastoid cavity. 2.  Continue with Flonase nasal spray 2 sprays each nostril daily. 3.  Dry ear precautions on the right side. 4.  The patient will return for reevaluation in 6 months.

## 2023-09-20 ENCOUNTER — Other Ambulatory Visit (HOSPITAL_COMMUNITY): Payer: Self-pay | Admitting: Radiology

## 2023-09-20 DIAGNOSIS — K810 Acute cholecystitis: Secondary | ICD-10-CM

## 2023-09-20 MED ORDER — SODIUM CHLORIDE 0.9 % IV SOLN: 2.0000 g | Freq: Once | INTRAVENOUS | Status: AC

## 2023-09-20 NOTE — H&P (Addendum)
 Chief Complaint: Patient was seen in consultation today for right upper abdominal pain, acute calculous cholecystitis     Referring Physician(s): Ebbie HERO  Supervising Physician: Jennefer Rover  Patient Status: [facility] - Out-pt  History of Present Illness: [name] is an 83 y.o. male with history of CHF, MI-PCI LAD 2001, CAD, a fib-on Eliquis , ischemic cardiomyopathy, v tach-defibrillator removed, CKD, BPH, dyslipidemia, and gout. Patient initially presented to [place] ED 06/26/23 with c/o abdominal pain and diagnosed with acute cholecystitis. Patient was not a surgical candidate due to his cardiac history. Percutaneous cholecystostomy drain placed by IR 06/29/23. Patient had a follow up with IR 08/11/23. At this visit, a cholangiogram and drain exchange was performed. Cholangiogram reported: patent appearing cystic and common bile ducts with a few small filling defects in the gallbladder body suggestive of cholelithiasis. Cardiac catheterization was performed 08/20/23, with reports of elevated cardiac risk. Echo was performed 06/30/23 and reported 25-30% left ventricular EF. Patient had a follow up with Glendia Ferrier, PA-C from cardiology 09/03/23. Patient was deemed high risk for any procedure, but was cleared for the spyglass procedure, with the understanding of high risk perioperative CV complications. Per Dr. Terrill note from 08/27/23 He is an excellent candidate for choledochoscopic guided percutaneous gallstone retrieval in hopes of eventual drain removal.    Past Medical History:  Diagnosis Date   AICD (automatic cardioverter/defibrillator) present    Arthritis    knees, back    BPH (benign prostatic hypertrophy)    CHF (congestive heart failure) (HCC)    Chronic kidney disease    BPH   Chronic systolic heart failure (HCC)    a. s/p MDT single chamber ICD 2004 as part of MASTER study b. upgrade to CRTD 2010; CRTD gen change 2016   Coronary artery disease    a.  s/p anterior MI and CYPHER stent to LAD 2005   Dyslipidemia    Dysrhythmia    a-fib   Gout    Ischemic cardiomyopathy    Paroxysmal atrial fibrillation (HCC)    Ventricular tachycardia (HCC)     Past Surgical History:  Procedure Laterality Date   ANAL RECTAL MANOMETRY N/A 06/23/2023   Procedure: ANO RECTAL MANOMETRY;  Surgeon: Shila Gustav GAILS, MD;  Location: [facility] ENDOSCOPY;  Service: Gastroenterology;  Laterality: N/A;   APPENDECTOMY  1970   BI-VENTRICULAR IMPLANTABLE CARDIOVERTER DEFIBRILLATOR UPGRADE N/A 11/07/2014   a. MDT single chamber ICD implanted 2004 as part of MASTER study; upgrade to CRTD 2010; gen change 2016   BIV ICD GENERATOR CHANGEOUT N/A 11/12/2021   Procedure: BIV ICD GENERATOR CHANGEOUT;  Surgeon: Fernande Elspeth BROCKS, MD;  Location: [facility] INVASIVE CV LAB;  Service: Cardiovascular;  Laterality: N/A;   CARDIAC CATHETERIZATION  09/08/2003   x3 total of 5 stents   CARDIOVERSION N/A 07/04/2019   Procedure: CARDIOVERSION;  Surgeon: Delford Maude BROCKS, MD;  Location: [facility] ENDOSCOPY;  Service: Cardiovascular;  Laterality: N/A;   CARDIOVERSION N/A 08/17/2019   Procedure: CARDIOVERSION;  Surgeon: Alveta Aleene PARAS, MD;  Location: [facility] ENDOSCOPY;  Service: Cardiovascular;  Laterality: N/A;   CARDIOVERSION N/A 05/04/2022   Procedure: CARDIOVERSION;  Surgeon: Shlomo Wilbert SAUNDERS, MD;  Location: [facility] ENDOSCOPY;  Service: Cardiovascular;  Laterality: N/A;   IR EXCHANGE BILIARY DRAIN  08/11/2023   IR PERC CHOLECYSTOSTOMY  06/29/2023   IR RADIOLOGIST EVAL & MGMT  08/27/2023   LEFT HEART CATH AND CORONARY ANGIOGRAPHY N/A 08/20/2023   Procedure: LEFT HEART CATH AND CORONARY ANGIOGRAPHY;  Surgeon: Elmira Newman PARAS, MD;  Location: [facility] INVASIVE CV LAB;  Service: Cardiovascular;  Laterality: N/A;   PROSTATECTOMY  11/06/2011   Procedure: PROSTATECTOMY SUPRAPUBIC;  Surgeon: Arlena LILLETTE Gal, MD;  Location: [facility] ORS;  Service: Urology;  Laterality: N/A;  Open Suprapubic Prostatectomy   TEE WITHOUT  CARDIOVERSION N/A 05/16/2019   Procedure: TRANSESOPHAGEAL ECHOCARDIOGRAM (TEE);  Surgeon: Barbaraann Darryle Ned, MD;  Location: [facility] ENDOSCOPY;  Service: Cardiology;  Laterality: N/A;   TOTAL KNEE ARTHROPLASTY Left 10/06/2021   Procedure: TOTAL KNEE ARTHROPLASTY;  Surgeon: Yvone Rush, MD;  Location: [facility] ORS;  Service: Orthopedics;  Laterality: Left;    Allergies: Lipitor [atorvastatin ] and Jardiance  [empagliflozin ]  Medications: Prior to Admission medications   Medication Sig Start Date End Date Taking? Authorizing Provider  acetaminophen  (TYLENOL ) 325 MG tablet Take 2 tablets (650 mg total) by mouth every 4 (four) hours as needed for headache or mild pain. 05/27/23   Ursuy, Renee Lynn, PA-C  albuterol  (VENTOLIN  HFA) 108 (90 Base) MCG/ACT inhaler Inhale 2 puffs into the lungs every 6 (six) hours as needed for wheezing or shortness of breath. 08/16/23   [provider]  allopurinol  (ZYLOPRIM ) 100 MG tablet Take 100 mg by mouth at bedtime. 05/14/15   [provider]  apixaban  (ELIQUIS ) 5 MG TABS tablet Take 5 mg by mouth 2 (two) times daily.    [provider]  carvedilol  (COREG ) 3.125 MG tablet TAKE 1 TABLET TWICE DAILY 07/29/23   Nahser, Aleene PARAS, MD  chlorpheniramine-HYDROcodone  (TUSSIONEX) 10-8 MG/5ML Take 5 mLs by mouth every 12 (twelve) hours as needed. 08/16/23   [provider]  doxycycline  (VIBRAMYCIN ) 100 MG capsule Take 100 mg by mouth 2 (two) times daily. 08/16/23   [provider]  furosemide  (LASIX ) 40 MG tablet Take 40 mg by mouth every other day.    [provider]  latanoprost  (XALATAN ) 0.005 % ophthalmic solution Place 1 drop into both eyes at bedtime.  10/03/13   [provider]  levothyroxine  (SYNTHROID ) 125 MCG tablet Take 1 tablet (125 mcg total) by mouth daily before breakfast. 08/03/23   Nahser, Aleene PARAS, MD  methylPREDNISolone  (MEDROL  DOSEPAK) 4 MG TBPK tablet Take 4 mg by mouth. 08/16/23   [provider]   nitroGLYCERIN  (NITROSTAT ) 0.4 MG SL tablet Place 1 tablet (0.4 mg total) under the tongue every 5 (five) minutes as needed for chest pain. 12/12/19   Nahser, Aleene PARAS, MD  potassium chloride  (KLOR-CON  M) 10 MEQ tablet Take 10 mEq by mouth every other day.    [provider]  rosuvastatin  (CRESTOR ) 10 MG tablet TAKE 1/2 TABLET EVERY DAY 03/24/23   Nahser, Aleene PARAS, MD  sodium chloride  flush (NS) 0.9 % SOLN Flush gallbladder drain once daily with at least 5 ml normal saline. Discard extra 5ml after use of the 10ml syringe 06/30/23   Covington, Jamie R, NP     Family History  Problem Relation Age of Onset   Heart disease Sister        3 73f the 4 had HD   Heart disease Brother        2 of 3 had HD   Heart disease Sister        1 of the 6 has HD   Heart disease Brother     Social History   Socioeconomic History   Marital status: Married    Spouse name: Not on file   Number of children: Not on file   Years of education: Not on file   Highest education level:  Not on file  Occupational History   Not on file  Tobacco Use   Smoking status: Never    Passive exposure: Never   Smokeless tobacco: Never  Vaping Use   Vaping status: Never Used  Substance and Sexual Activity   Alcohol  use: No    Comment: occasional beer    Drug use: No   Sexual activity: Not on file  Other Topics Concern   Not on file  Social History Narrative   Not on file   Social Drivers of Health   Financial Resource Strain: Not on file  Food Insecurity: No Food Insecurity (06/26/2023)   Hunger Vital Sign    Worried About Running Out of Food in the Last Year: Never true    Ran Out of Food in the Last Year: Never true  Transportation Needs: No Transportation Needs (06/26/2023)   PRAPARE - Administrator, Civil Service (Medical): No    Lack of Transportation (Non-Medical): No  Physical Activity: Not on file  Stress: Not on file  Social Connections: Not on file       Review of Systems;  denies fever,HA,CP, dyspnea, cough, N/V or bleeding; he does have RUQ/back pain  Vital Signs: Vitals:   09/21/23 0810  BP: 123/78  Pulse: 81  Resp: 17  Temp: 98 F (36.7 C)  SpO2: 94%   Code Status: FULL CODE   Advance Care Plan:  no documents on file   Physical Exam: awake/alert, chest- sl dim BS rt base, left clear; heart- nl rate, irreg rhythm; abd-soft,+BS, intact GB drain, insertion site mod tender to palpation; no hemobilia; 1-2+ pretibial edema bilat  Imaging: IR Radiologist Eval & Mgmt Result Date: 08/27/2023 EXAM: NEW PATIENT OFFICE VISIT CHIEF COMPLAINT: See Epic note. HISTORY OF PRESENT ILLNESS: See Epic note. REVIEW OF SYSTEMS: See Epic note. PHYSICAL EXAMINATION: See Epic note. ASSESSMENT AND PLAN: See Epic note. Ester Sides, MD [facility] Electronically Signed   By: Ester Sides M.D.   On: 08/27/2023 09:57    Labs:  CBC: Recent Labs    06/28/23 0828 06/30/23 0536 07/01/23 0803 08/20/23 0627  WBC 6.0 6.1 8.6 10.5  HGB 10.7* 11.0* 10.9* 12.2*  HCT 33.6* 33.6* 33.2* 38.0*  PLT 255 265 269 304    COAGS: Recent Labs    06/27/23 0919 06/27/23 1904 06/28/23 0828 06/28/23 1811 06/28/23 2349  INR  --   --   --   --  1.2  APTT 40* 137* 152* 78*  --     BMP: Recent Labs    06/28/23 0828 06/30/23 0536 07/01/23 0804 08/20/23 0627 09/03/23 1041  NA 137 135 136 136 144  K 4.0 4.0 4.5 4.3 4.7  CL 104 102 104 106 104  CO2 24 24 23 24 23   GLUCOSE 109* 95 252* 121* 135*  BUN 16 13 13 22 19   CALCIUM  8.7* 8.6* 8.9 8.5* 9.8  CREATININE 1.19 1.43* 1.46* 0.91 1.06  GFRNONAA >60 49* 47* >60  --     LIVER FUNCTION TESTS: Recent Labs    06/26/23 0857 06/27/23 0919 06/30/23 0536  BILITOT 0.9 1.1 1.2  AST 23 22 21   ALT 18 16 16   ALKPHOS 76 87 80  PROT 6.0* 6.0* 6.2*  ALBUMIN  3.0* 2.9* 3.0*    TUMOR MARKERS: No results for input(s): AFPTM, CEA, CA199, CHROMGRNA in the last 8760  hours.  Assessment and Plan:  [name] is an 83 y.o. male  with history of CHF, MI-PCI LAD 2001, CAD, a fib-on Eliquis , ischemic cardiomyopathy, v tach-defibrillator removed, CKD, BPH, dyslipidemia, and gout. Patient initially presented to [place] ED 06/26/23 with c/o abdominal pain and diagnosed with acute cholecystitis. Patient was not a surgical candidate due to his cardiac history. Percutaneous cholecystostomy drain placed by IR 06/29/23. Patient had a follow up with IR 08/11/23. At this visit, a cholangiogram and drain exchange was performed. Cholangiogram reported: patent appearing cystic and common bile ducts with a few small filling defects in the gallbladder body suggestive of cholelithiasis. Cardiac catheterization was performed 08/20/23, with reports of elevated cardiac risk. Echo was performed 06/30/23 and reported 25-30% left ventricular EF. Patient had a follow up with Glendia Ferrier, PA-C from cardiology 09/03/23. Patient was deemed high risk for any procedure, but was cleared for the spyglass procedure, with the understanding of high risk perioperative CV complications. Per Dr. Terrill note 08/27/23, He is an excellent candidate for choledochoscopic guided percutaneous gallstone retrieval in hopes of eventual drain removal.  Risks and benefits of spyglass was discussed with the patient  including, but not limited to bleeding, infection, damage to adjacent structures or low yield requiring additional tests.  All of the questions were answered and there is agreement to proceed.  Consent signed and in chart.  Thank you for this interesting consult.  I greatly enjoyed meeting [name] and look forward to participating in their care.  A copy of this report was sent to the requesting provider on this date.  Electronically Signed: Daved LOISE Hipp, PA/Kevin Aislee Landgren,PA-C 09/20/2023, 10:03 AM   I spent a total of 25 minutes   in face to face in clinical consultation,  greater than 50% of which was counseling/coordinating care for spyglass procedure.

## 2023-09-21 ENCOUNTER — Ambulatory Visit (HOSPITAL_COMMUNITY)
Admission: RE | Admit: 2023-09-21 | Discharge: 2023-09-21 | Disposition: A | Discharge status: Home / Self Care | Payer: Medicare PPO | Source: Ambulatory Visit | Attending: Interventional Radiology

## 2023-09-21 ENCOUNTER — Other Ambulatory Visit (HOSPITAL_COMMUNITY): Payer: Self-pay | Admitting: Interventional Radiology

## 2023-09-21 ENCOUNTER — Encounter (HOSPITAL_COMMUNITY): Payer: Self-pay

## 2023-09-21 DIAGNOSIS — I509 Heart failure, unspecified: Secondary | ICD-10-CM | POA: Insufficient documentation

## 2023-09-21 DIAGNOSIS — I255 Ischemic cardiomyopathy: Secondary | ICD-10-CM | POA: Insufficient documentation

## 2023-09-21 DIAGNOSIS — N189 Chronic kidney disease, unspecified: Secondary | ICD-10-CM | POA: Insufficient documentation

## 2023-09-21 DIAGNOSIS — E785 Hyperlipidemia, unspecified: Secondary | ICD-10-CM | POA: Insufficient documentation

## 2023-09-21 DIAGNOSIS — I2510 Atherosclerotic heart disease of native coronary artery without angina pectoris: Secondary | ICD-10-CM | POA: Insufficient documentation

## 2023-09-21 DIAGNOSIS — Z7901 Long term (current) use of anticoagulants: Secondary | ICD-10-CM | POA: Insufficient documentation

## 2023-09-21 DIAGNOSIS — K8000 Calculus of gallbladder with acute cholecystitis without obstruction: Secondary | ICD-10-CM | POA: Insufficient documentation

## 2023-09-21 DIAGNOSIS — K819 Cholecystitis, unspecified: Secondary | ICD-10-CM

## 2023-09-21 DIAGNOSIS — M109 Gout, unspecified: Secondary | ICD-10-CM | POA: Insufficient documentation

## 2023-09-21 DIAGNOSIS — I4891 Unspecified atrial fibrillation: Secondary | ICD-10-CM | POA: Insufficient documentation

## 2023-09-21 DIAGNOSIS — I252 Old myocardial infarction: Secondary | ICD-10-CM | POA: Insufficient documentation

## 2023-09-21 DIAGNOSIS — Z955 Presence of coronary angioplasty implant and graft: Secondary | ICD-10-CM | POA: Insufficient documentation

## 2023-09-21 DIAGNOSIS — N400 Enlarged prostate without lower urinary tract symptoms: Secondary | ICD-10-CM | POA: Insufficient documentation

## 2023-09-21 DIAGNOSIS — K8020 Calculus of gallbladder without cholecystitis without obstruction: Secondary | ICD-10-CM

## 2023-09-21 DIAGNOSIS — I130 Hypertensive heart and chronic kidney disease with heart failure and stage 1 through stage 4 chronic kidney disease, or unspecified chronic kidney disease: Secondary | ICD-10-CM | POA: Diagnosis not present

## 2023-09-21 DIAGNOSIS — Z79899 Other long term (current) drug therapy: Secondary | ICD-10-CM | POA: Insufficient documentation

## 2023-09-21 DIAGNOSIS — K810 Acute cholecystitis: Secondary | ICD-10-CM

## 2023-09-21 HISTORY — DX: Angina pectoris, unspecified: I20.9

## 2023-09-21 HISTORY — DX: Dyspnea, unspecified: R06.00

## 2023-09-21 HISTORY — PX: IR REMOVAL OF CALCULI/DEBRIS BILIARY DUCT/GB: IMG6054

## 2023-09-21 LAB — CBC WITH DIFFERENTIAL/PLATELET
Abs Immature Granulocytes: 0.03 10*3/uL (ref 0.00–0.07)
Basophils Absolute: 0.1 10*3/uL (ref 0.0–0.1)
Basophils Relative: 1 %
Eosinophils Absolute: 0.1 10*3/uL (ref 0.0–0.5)
Eosinophils Relative: 1 %
HCT: 43.2 % (ref 39.0–52.0)
Hemoglobin: 13.6 g/dL (ref 13.0–17.0)
Immature Granulocytes: 0 %
Lymphocytes Relative: 10 %
Lymphs Abs: 1 10*3/uL (ref 0.7–4.0)
MCH: 30.8 pg (ref 26.0–34.0)
MCHC: 31.5 g/dL (ref 30.0–36.0)
MCV: 97.7 fL (ref 80.0–100.0)
Monocytes Absolute: 1.2 10*3/uL — ABNORMAL HIGH (ref 0.1–1.0)
Monocytes Relative: 12 %
Neutro Abs: 7.2 10*3/uL (ref 1.7–7.7)
Neutrophils Relative %: 76 %
Platelets: 245 10*3/uL (ref 150–400)
RBC: 4.42 MIL/uL (ref 4.22–5.81)
RDW: 14.1 % (ref 11.5–15.5)
WBC: 9.5 10*3/uL (ref 4.0–10.5)
nRBC: 0 % (ref 0.0–0.2)

## 2023-09-21 LAB — COMPREHENSIVE METABOLIC PANEL
ALT: 12 U/L (ref 0–44)
AST: 19 U/L (ref 15–41)
Albumin: 3.7 g/dL (ref 3.5–5.0)
Alkaline Phosphatase: 80 U/L (ref 38–126)
Anion gap: 8 (ref 5–15)
BUN: 19 mg/dL (ref 8–23)
CO2: 24 mmol/L (ref 22–32)
Calcium: 8.8 mg/dL — ABNORMAL LOW (ref 8.9–10.3)
Chloride: 103 mmol/L (ref 98–111)
Creatinine, Ser: 0.89 mg/dL (ref 0.61–1.24)
GFR, Estimated: >60 mL/min (ref >60)
Glucose, Bld: 122 mg/dL — ABNORMAL HIGH (ref 70–99)
Potassium: 3.9 mmol/L (ref 3.5–5.1)
Sodium: 135 mmol/L (ref 135–145)
Total Bilirubin: 2.3 mg/dL — ABNORMAL HIGH (ref 0.0–1.2)
Total Protein: 7.2 g/dL (ref 6.5–8.1)

## 2023-09-21 LAB — GLUCOSE, CAPILLARY: Glucose-Capillary: 108 mg/dL — ABNORMAL HIGH (ref 70–99)

## 2023-09-21 LAB — PROTIME-INR
INR: 1.3 — ABNORMAL HIGH (ref 0.8–1.2)
Prothrombin Time: 16 s — ABNORMAL HIGH (ref 11.4–15.2)

## 2023-09-21 MED ORDER — FENTANYL CITRATE (PF) 100 MCG/2ML IJ SOLN
INTRAMUSCULAR | Status: AC
  Filled 2023-09-21: qty 2

## 2023-09-21 MED ORDER — MIDAZOLAM HCL 2 MG/2ML IJ SOLN
INTRAMUSCULAR | Status: AC | PRN
  Administered 2023-09-21 (×5): .5 mg via INTRAVENOUS

## 2023-09-21 MED ORDER — SODIUM CHLORIDE 0.9 % IV SOLN
INTRAVENOUS | Status: AC
  Filled 2023-09-21: qty 2

## 2023-09-21 MED ORDER — LIDOCAINE-EPINEPHRINE 1 %-1:100000 IJ SOLN
20.0000 mL | Freq: Once | INTRAMUSCULAR | Status: AC
  Administered 2023-09-21: 15 mL via INTRADERMAL

## 2023-09-21 MED ORDER — SODIUM CHLORIDE 0.9% FLUSH: 5.0000 mL | Freq: Three times a day (TID) | INTRAVENOUS | Status: DC

## 2023-09-21 MED ORDER — SODIUM CHLORIDE 0.9 % IV SOLN: INTRAVENOUS | Status: DC

## 2023-09-21 MED ORDER — SODIUM CHLORIDE 0.9 % IV SOLN
INTRAVENOUS | Status: AC | PRN
  Administered 2023-09-21: 2 g via INTRAVENOUS

## 2023-09-21 MED ORDER — LIDOCAINE-EPINEPHRINE 1 %-1:100000 IJ SOLN
INTRAMUSCULAR | Status: AC
  Filled 2023-09-21: qty 1

## 2023-09-21 MED ORDER — FENTANYL CITRATE (PF) 100 MCG/2ML IJ SOLN
INTRAMUSCULAR | Status: AC | PRN
  Administered 2023-09-21 (×5): 25 ug via INTRAVENOUS

## 2023-09-21 MED ORDER — SODIUM CHLORIDE 0.9 % IV SOLN: 2.0000 g | Freq: Once | INTRAVENOUS | Status: DC

## 2023-09-21 MED ORDER — MIDAZOLAM HCL 2 MG/2ML IJ SOLN
INTRAMUSCULAR | Status: AC
  Filled 2023-09-21: qty 2

## 2023-09-21 MED ORDER — IOHEXOL 300 MG/ML  SOLN
50.0000 mL | Freq: Once | INTRAMUSCULAR | Status: AC | PRN
  Administered 2023-09-21: 20 mL

## 2023-09-21 NOTE — Procedures (Addendum)
 Interventional Radiology Procedure Note  Procedure:  1) Percutaneous cholangiogram 2) Cholangioscopy 3) Percutaneous gallstone retrieval 4) Cholecystostomy tube exchange  Findings: Please refer to procedural dictation for full description. Patent cystic and common bile ducts. Few small, black gallstones removed.  No evidence of residual stones upon completion.  Drain exchanged for 14 Fr pigtail, to bag drainage.  Complications: None immediate  Estimated Blood Loss: < 5 ml  Recommendations: Keep to bag drainage for 2-3 weeks.  Will arrange outpatient follow up for drain injection to assess for residual stone burden and cystic duct patency.   May resume eliquis  tomorrow.   Ester Sides, MD

## 2023-09-21 NOTE — Discharge Instructions (Signed)
 Flush tube with NS 5 ml three times per day

## 2023-09-22 ENCOUNTER — Other Ambulatory Visit (HOSPITAL_COMMUNITY): Payer: Self-pay | Admitting: Interventional Radiology

## 2023-09-22 ENCOUNTER — Other Ambulatory Visit (HOSPITAL_COMMUNITY): Payer: Medicare PPO

## 2023-09-22 DIAGNOSIS — K8020 Calculus of gallbladder without cholecystitis without obstruction: Secondary | ICD-10-CM

## 2023-09-22 DIAGNOSIS — K819 Cholecystitis, unspecified: Secondary | ICD-10-CM

## 2023-09-23 ENCOUNTER — Inpatient Hospital Stay (HOSPITAL_BASED_OUTPATIENT_CLINIC_OR_DEPARTMENT_OTHER)
Admission: EM | Admit: 2023-09-23 | Discharge: 2023-10-01 | DRG: 291 | Disposition: A | Discharge status: Home / Self Care | Payer: Medicare PPO | Attending: Internal Medicine | Admitting: Internal Medicine

## 2023-09-23 ENCOUNTER — Other Ambulatory Visit (HOSPITAL_COMMUNITY): Payer: Self-pay | Admitting: Interventional Radiology

## 2023-09-23 ENCOUNTER — Other Ambulatory Visit: Payer: Self-pay

## 2023-09-23 ENCOUNTER — Emergency Department (HOSPITAL_BASED_OUTPATIENT_CLINIC_OR_DEPARTMENT_OTHER): Payer: Medicare PPO | Admitting: Radiology

## 2023-09-23 DIAGNOSIS — K819 Cholecystitis, unspecified: Secondary | ICD-10-CM

## 2023-09-23 DIAGNOSIS — I480 Paroxysmal atrial fibrillation: Secondary | ICD-10-CM | POA: Diagnosis present

## 2023-09-23 DIAGNOSIS — N1831 Chronic kidney disease, stage 3a: Secondary | ICD-10-CM | POA: Diagnosis present

## 2023-09-23 DIAGNOSIS — Z1152 Encounter for screening for COVID-19: Secondary | ICD-10-CM

## 2023-09-23 DIAGNOSIS — Z79899 Other long term (current) drug therapy: Secondary | ICD-10-CM

## 2023-09-23 DIAGNOSIS — K8012 Calculus of gallbladder with acute and chronic cholecystitis without obstruction: Secondary | ICD-10-CM | POA: Diagnosis present

## 2023-09-23 DIAGNOSIS — I25810 Atherosclerosis of coronary artery bypass graft(s) without angina pectoris: Secondary | ICD-10-CM | POA: Diagnosis present

## 2023-09-23 DIAGNOSIS — I493 Ventricular premature depolarization: Secondary | ICD-10-CM | POA: Diagnosis present

## 2023-09-23 DIAGNOSIS — J9601 Acute respiratory failure with hypoxia: Secondary | ICD-10-CM | POA: Diagnosis present

## 2023-09-23 DIAGNOSIS — Z96652 Presence of left artificial knee joint: Secondary | ICD-10-CM | POA: Diagnosis present

## 2023-09-23 DIAGNOSIS — J1000 Influenza due to other identified influenza virus with unspecified type of pneumonia: Secondary | ICD-10-CM | POA: Diagnosis present

## 2023-09-23 DIAGNOSIS — I5043 Acute on chronic combined systolic (congestive) and diastolic (congestive) heart failure: Secondary | ICD-10-CM | POA: Insufficient documentation

## 2023-09-23 DIAGNOSIS — J101 Influenza due to other identified influenza virus with other respiratory manifestations: Secondary | ICD-10-CM

## 2023-09-23 DIAGNOSIS — R6510 Systemic inflammatory response syndrome (SIRS) of non-infectious origin without acute organ dysfunction: Secondary | ICD-10-CM | POA: Diagnosis not present

## 2023-09-23 DIAGNOSIS — J189 Pneumonia, unspecified organism: Secondary | ICD-10-CM | POA: Diagnosis present

## 2023-09-23 DIAGNOSIS — I2510 Atherosclerotic heart disease of native coronary artery without angina pectoris: Secondary | ICD-10-CM | POA: Diagnosis present

## 2023-09-23 DIAGNOSIS — D631 Anemia in chronic kidney disease: Secondary | ICD-10-CM | POA: Diagnosis present

## 2023-09-23 DIAGNOSIS — D649 Anemia, unspecified: Secondary | ICD-10-CM | POA: Diagnosis present

## 2023-09-23 DIAGNOSIS — E871 Hypo-osmolality and hyponatremia: Secondary | ICD-10-CM | POA: Diagnosis not present

## 2023-09-23 DIAGNOSIS — Z955 Presence of coronary angioplasty implant and graft: Secondary | ICD-10-CM

## 2023-09-23 DIAGNOSIS — I444 Left anterior fascicular block: Secondary | ICD-10-CM | POA: Diagnosis present

## 2023-09-23 DIAGNOSIS — K810 Acute cholecystitis: Secondary | ICD-10-CM | POA: Diagnosis present

## 2023-09-23 DIAGNOSIS — G9341 Metabolic encephalopathy: Secondary | ICD-10-CM | POA: Diagnosis not present

## 2023-09-23 DIAGNOSIS — Z888 Allergy status to other drugs, medicaments and biological substances status: Secondary | ICD-10-CM

## 2023-09-23 DIAGNOSIS — I509 Heart failure, unspecified: Principal | ICD-10-CM

## 2023-09-23 DIAGNOSIS — I255 Ischemic cardiomyopathy: Secondary | ICD-10-CM | POA: Diagnosis present

## 2023-09-23 DIAGNOSIS — I4891 Unspecified atrial fibrillation: Secondary | ICD-10-CM | POA: Diagnosis present

## 2023-09-23 DIAGNOSIS — I5023 Acute on chronic systolic (congestive) heart failure: Secondary | ICD-10-CM

## 2023-09-23 DIAGNOSIS — I252 Old myocardial infarction: Secondary | ICD-10-CM

## 2023-09-23 DIAGNOSIS — Z79890 Hormone replacement therapy (postmenopausal): Secondary | ICD-10-CM

## 2023-09-23 DIAGNOSIS — N400 Enlarged prostate without lower urinary tract symptoms: Secondary | ICD-10-CM | POA: Diagnosis present

## 2023-09-23 DIAGNOSIS — Z95810 Presence of automatic (implantable) cardiac defibrillator: Secondary | ICD-10-CM

## 2023-09-23 DIAGNOSIS — M109 Gout, unspecified: Secondary | ICD-10-CM | POA: Diagnosis present

## 2023-09-23 DIAGNOSIS — Z8249 Family history of ischemic heart disease and other diseases of the circulatory system: Secondary | ICD-10-CM

## 2023-09-23 DIAGNOSIS — E119 Type 2 diabetes mellitus without complications: Secondary | ICD-10-CM

## 2023-09-23 DIAGNOSIS — I5021 Acute systolic (congestive) heart failure: Secondary | ICD-10-CM | POA: Diagnosis present

## 2023-09-23 DIAGNOSIS — Z7901 Long term (current) use of anticoagulants: Secondary | ICD-10-CM

## 2023-09-23 DIAGNOSIS — E1122 Type 2 diabetes mellitus with diabetic chronic kidney disease: Secondary | ICD-10-CM | POA: Diagnosis present

## 2023-09-23 DIAGNOSIS — I130 Hypertensive heart and chronic kidney disease with heart failure and stage 1 through stage 4 chronic kidney disease, or unspecified chronic kidney disease: Principal | ICD-10-CM | POA: Diagnosis present

## 2023-09-23 DIAGNOSIS — E785 Hyperlipidemia, unspecified: Secondary | ICD-10-CM | POA: Diagnosis present

## 2023-09-23 DIAGNOSIS — I10 Essential (primary) hypertension: Secondary | ICD-10-CM | POA: Diagnosis present

## 2023-09-23 DIAGNOSIS — J40 Bronchitis, not specified as acute or chronic: Secondary | ICD-10-CM | POA: Diagnosis present

## 2023-09-23 DIAGNOSIS — E8809 Other disorders of plasma-protein metabolism, not elsewhere classified: Secondary | ICD-10-CM | POA: Diagnosis present

## 2023-09-23 DIAGNOSIS — J918 Pleural effusion in other conditions classified elsewhere: Secondary | ICD-10-CM | POA: Diagnosis not present

## 2023-09-23 LAB — CBC WITH DIFFERENTIAL/PLATELET
Abs Immature Granulocytes: 0.03 10*3/uL (ref 0.00–0.07)
Basophils Absolute: 0 10*3/uL (ref 0.0–0.1)
Basophils Relative: 0 %
Eosinophils Absolute: 0.1 10*3/uL (ref 0.0–0.5)
Eosinophils Relative: 1 %
HCT: 37.5 % — ABNORMAL LOW (ref 39.0–52.0)
Hemoglobin: 12.4 g/dL — ABNORMAL LOW (ref 13.0–17.0)
Immature Granulocytes: 0 %
Lymphocytes Relative: 9 %
Lymphs Abs: 1 10*3/uL (ref 0.7–4.0)
MCH: 30.8 pg (ref 26.0–34.0)
MCHC: 33.1 g/dL (ref 30.0–36.0)
MCV: 93.1 fL (ref 80.0–100.0)
Monocytes Absolute: 1.2 10*3/uL — ABNORMAL HIGH (ref 0.1–1.0)
Monocytes Relative: 11 %
Neutro Abs: 8.1 10*3/uL — ABNORMAL HIGH (ref 1.7–7.7)
Neutrophils Relative %: 79 %
Platelets: 212 10*3/uL (ref 150–400)
RBC: 4.03 MIL/uL — ABNORMAL LOW (ref 4.22–5.81)
RDW: 14.2 % (ref 11.5–15.5)
WBC: 10.3 10*3/uL (ref 4.0–10.5)
nRBC: 0 % (ref 0.0–0.2)

## 2023-09-23 LAB — BASIC METABOLIC PANEL
Anion gap: 10 (ref 5–15)
BUN: 25 mg/dL — ABNORMAL HIGH (ref 8–23)
CO2: 21 mmol/L — ABNORMAL LOW (ref 22–32)
Calcium: 8.8 mg/dL — ABNORMAL LOW (ref 8.9–10.3)
Chloride: 102 mmol/L (ref 98–111)
Creatinine, Ser: 1.03 mg/dL (ref 0.61–1.24)
GFR, Estimated: >60 mL/min (ref >60)
Glucose, Bld: 117 mg/dL — ABNORMAL HIGH (ref 70–99)
Potassium: 3.8 mmol/L (ref 3.5–5.1)
Sodium: 133 mmol/L — ABNORMAL LOW (ref 135–145)

## 2023-09-23 LAB — TROPONIN I (HIGH SENSITIVITY)
Troponin I (High Sensitivity): 25 ng/L — ABNORMAL HIGH (ref <18)
Troponin I (High Sensitivity): 27 ng/L — ABNORMAL HIGH (ref <18)

## 2023-09-23 LAB — BRAIN NATRIURETIC PEPTIDE: B Natriuretic Peptide: 456.8 pg/mL — ABNORMAL HIGH (ref 0.0–100.0)

## 2023-09-23 MED ORDER — FUROSEMIDE 10 MG/ML IJ SOLN
40.0000 mg | Freq: Once | INTRAMUSCULAR | Status: AC
  Administered 2023-09-23: 40 mg via INTRAVENOUS
  Filled 2023-09-23: qty 4

## 2023-09-23 NOTE — ED Notes (Signed)
Pt and family member refused nasal swab. MD notified, no new orders.

## 2023-09-23 NOTE — ED Notes (Signed)
1 attempt to start a new IV, obtained blood but missed IV. Charge nurse contacted to attempt IV.

## 2023-09-23 NOTE — ED Triage Notes (Signed)
Pt POV from home with family reporting SOB since Tuesday following gallbladder procedure due to pain. Per home health nurse, crackles in lungs and SPO2 80s at home, advised to come to ED. SPO2 95% RA, lungs clear in triage, slightly labored.

## 2023-09-23 NOTE — ED Notes (Signed)
Pt ambulates to bathroom for BM with steady gait, SBA.

## 2023-09-23 NOTE — ED Notes (Signed)
Pt assessed in triage for SOB. Pt BLBS clr/clr dim w/no distress/increased WOB noted during assessment. Pt respiratory status stable on RA.    09/23/23 1550  Therapy Vitals  Temp 99.5 F (37.5 C)  Temp Source Oral  Pulse Rate 88  Resp (!) 23  BP 116/87  MEWS Score/Color  MEWS Score 1  MEWS Score Color Green  Respiratory Assessment  $ RT Protocol Assessment  Yes  Assessment Type Assess only  Respiratory Pattern Regular;Unlabored;Dyspnea at rest;Symmetrical  Chest Assessment Chest expansion symmetrical  Cough None  Bilateral Breath Sounds Clear;Diminished  R Upper  Breath Sounds Clear  L Upper Breath Sounds Clear  R Lower Breath Sounds Clear;Diminished  L Lower Breath Sounds Clear;Diminished  Oxygen Therapy/Pulse Ox  O2 Device Room Air  O2 Therapy Room air  SpO2 94 %

## 2023-09-23 NOTE — ED Provider Notes (Signed)
[place] EMERGENCY DEPARTMENT AT [facility] Provider Note   CSN: [id number] Arrival date & time: 09/23/23  1539     History  Chief Complaint  Patient presents with   Shortness of Breath    [name] is a 83 y.o. male.  With history of paroxysmal A-fib on Eliquis, CHF, CKD, dyslipidemia presenting to the ED for evaluation of shortness of breath.  He was diagnosed with acute calculus cholecystitis on 06/26/2023.  He was not a candidate for cholecystectomy due to his cardiac history so he had an IR drain placed.  He underwent percutaneous gallstone retrieval on 09/21/2023.  This was successful with several small gallstones retrieved.  He was undergoing physical therapy today and was short of breath and had a low-grade fever along with abdominal pain and diarrhea.  The physical therapist called his interventional radiologist and the patient was encouraged to come to the emergency department for further evaluation.  He states his right upper quadrant pain has improved since the procedure 2 days ago but is still present.  He states he has difficulty taking a deep breath due to the pain which he believes is what is causing the shortness of breath.  He denies cough.  He denies any chest pain.  He was off of Eliquis for 2 days prior to the procedure and then resumed Eliquis immediately afterwards.  He reports compliance with the remainder of his medications.  He does report some rhinorrhea.  He did not receive his flu or pneumococcal vaccine this year.   Shortness of Breath      Home Medications Prior to Admission medications   Medication Sig Start Date End Date Taking? Authorizing Provider  acetaminophen (TYLENOL) 325 MG tablet Take 2 tablets (650 mg total) by mouth every 4 (four) hours as needed for headache or mild pain. 05/27/23  Yes Sheilah Pigeon, PA-C  allopurinol (ZYLOPRIM) 100 MG tablet Take 100 mg by mouth at bedtime. 05/14/15  Yes [provider]  apixaban  (ELIQUIS) 5 MG TABS tablet Take 5 mg by mouth 2 (two) times daily.   Yes [provider]  carvedilol (COREG) 3.125 MG tablet TAKE 1 TABLET TWICE DAILY 07/29/23  Yes Nahser, Deloris Ping, MD  furosemide (LASIX) 40 MG tablet Take 40 mg by mouth every other day.   Yes [provider]  latanoprost (XALATAN) 0.005 % ophthalmic solution Place 1 drop into both eyes at bedtime.  10/03/13  Yes [provider]  levothyroxine (SYNTHROID) 125 MCG tablet Take 1 tablet (125 mcg total) by mouth daily before breakfast. 08/03/23  Yes Nahser, Deloris Ping, MD  nitroGLYCERIN (NITROSTAT) 0.4 MG SL tablet Place 1 tablet (0.4 mg total) under the tongue every 5 (five) minutes as needed for chest pain. 12/12/19  Yes Nahser, Deloris Ping, MD  potassium chloride (KLOR-CON M) 10 MEQ tablet Take 10 mEq by mouth every other day.   Yes [provider]  rosuvastatin (CRESTOR) 10 MG tablet TAKE 1/2 TABLET EVERY DAY 03/24/23  Yes Nahser, Deloris Ping, MD  sodium chloride flush (NS) 0.9 % SOLN Flush gallbladder drain once daily with at least 5 ml normal saline. Discard extra 5ml after use of the 10ml syringe 06/30/23  Yes Covington, Arman Filter, NP  chlorpheniramine-HYDROcodone (TUSSIONEX) 10-8 MG/5ML Take 5 mLs by mouth every 12 (twelve) hours as needed. Patient not taking: Reported on 09/23/2023 08/16/23   [provider]  doxycycline (VIBRAMYCIN) 100 MG capsule Take 100 mg by mouth 2 (two) times daily. Patient  not taking: Reported on 09/23/2023 08/16/23   [provider]  methylPREDNISolone (MEDROL DOSEPAK) 4 MG TBPK tablet Take 4 mg by mouth. Patient not taking: Reported on 09/23/2023 08/16/23   [provider]      Allergies    Lipitor [atorvastatin] and Jardiance [empagliflozin]    Review of Systems   Review of Systems  Respiratory:  Positive for shortness of breath.   All other systems reviewed and are negative.   Physical Exam Updated Vital Signs BP 116/87   Pulse 88   Temp 99.5  F (37.5 C) (Oral)   Resp (!) 23   Ht 5\' 10"  (1.778 m)   Wt 100.2 kg   SpO2 94%   BMI 31.71 kg/m  Physical Exam Vitals and nursing note reviewed.  Constitutional:      General: He is not in acute distress.    Appearance: He is well-developed.     Comments: Resting comfortably in bed  HENT:     Head: Normocephalic and atraumatic.  Eyes:     Conjunctiva/sclera: Conjunctivae normal.  Cardiovascular:     Rate and Rhythm: Normal rate and regular rhythm.     Heart sounds: No murmur heard. Pulmonary:     Effort: Pulmonary effort is normal. No respiratory distress.     Breath sounds: Examination of the right-lower field reveals rales. Rales present. No decreased breath sounds, wheezing or rhonchi.  Abdominal:     Palpations: Abdomen is soft.     Tenderness: There is no abdominal tenderness.     Comments: Percutaneous drain in the right upper quadrant draining green bile  Musculoskeletal:        General: No swelling.     Cervical back: Neck supple.     Right lower leg: Edema present.     Left lower leg: Edema present.     Comments: 2+ pitting edema to bilateral lower extremities  Skin:    General: Skin is warm and dry.     Capillary Refill: Capillary refill takes less than 2 seconds.  Neurological:     Mental Status: He is alert.  Psychiatric:        Mood and Affect: Mood normal.     ED Results / Procedures / Treatments   Labs (all labs ordered are listed, but only abnormal results are displayed) Labs Reviewed  RESP PANEL BY RT-PCR (RSV, FLU A&B, COVID)  [id number]  BASIC METABOLIC PANEL  CBC WITH DIFFERENTIAL/PLATELET  BRAIN NATRIURETIC PEPTIDE  TROPONIN I (HIGH SENSITIVITY)    EKG None  Radiology DG Chest 2 View Result Date: 09/23/2023 CLINICAL DATA:  Shortness of breath. EXAM: CHEST - 2 VIEW COMPARISON:  Radiograph 06/26/2023 FINDINGS: The heart is enlarged. Coronary stent visualized. Right pleural effusion, at least moderate in size. Bandlike opacity in the right  mid lung may represent fluid in the fissure. Mild vascular congestion. No pneumothorax. Mild thoracic spondylosis. Drainage catheter in the upper abdomen is seen only on the lateral view. IMPRESSION: Cardiomegaly. Moderate pleural effusion with fluid in the right minor fissure. Vascular congestion. Recommend correlation for fluid overload. Electronically Signed   By: Narda Rutherford M.D.   On: 09/23/2023 16:56    Procedures Procedures    Medications Ordered in ED Medications - No data to display  ED Course/ Medical Decision Making/ A&P Clinical Course as of 09/23/23 1738  Thu Sep 23, 2023  1630 Stable  49 YOM with a chief complaint of SOB. CHF  [CC]    Clinical Course User Index [  CC] Glyn Ade, MD                                 Medical Decision Making Amount and/or Complexity of Data Reviewed Labs: ordered. Radiology: ordered.  This patient presents to the ED for concern of shortness of breath, abdominal pain, this involves an extensive number of treatment options, and is a complaint that carries with it a high risk of complications and morbidity.  The emergent differential diagnosis for shortness of breath includes, but is not limited to, Pulmonary edema, bronchoconstriction, Pneumonia, Pulmonary embolism, Pneumotherax/ Hemothorax, Dysrythmia, ACS.    My initial workup includes labs, imaging, EKG.  Patient declines pain medication  Additional history obtained from: Nursing notes from this visit. Previous records within EMR system procedure note and a phone visit with interventional radiology recommending ED evaluation Family daughter at bedside provides portion of history  I ordered, reviewed and interpreted labs which include: CBC, BMP, BNP, troponin, respiratory panel  I ordered imaging studies including chest x-ray I independently visualized and interpreted imaging which showed right lower lobe effusion and vascular congestion I agree with the radiologist  interpretation  Cardiac Monitoring:  The patient was maintained on a cardiac monitor.  I personally viewed and interpreted the cardiac monitored which showed an underlying rhythm of: Rate controlled atrial fibrillation  Afebrile, intermittently hypoxic to 85 to 95% on room air but otherwise hemodynamically stable.  83 year old male presenting to the ED for evaluation of shortness of breath.  He had a recent gallstone extraction.  His symptoms have been persistent since that time.  His right upper quadrant pain has improved.  He denies any chest pain or cough.  He reports intermittently feeling subjective fevers.  On exam, he does have bilateral 2+ pitting edema.  He does have right lower lobe rales as well.  Chest x-ray with right sided pleural effusion.  Shortness of breath overall concerning for CHF exacerbation.  Labs pending.  Patient has a history of CKD as well. Care handed off to Dr. Doran Durand pending lab results. May need admission for CHF exacerbation and intermittent hypoxia in the setting of CKD. Plan may change at the discretion of the oncoming provider. Please see their note for final disposition and decision making.   Patient's case discussed with Dr. Doran Durand who agrees with plan to discharge with follow-up.   Note: Portions of this report may have been transcribed using voice recognition software. Every effort was made to ensure accuracy; however, inadvertent computerized transcription errors may still be present.         Final Clinical Impression(s) / ED Diagnoses Final diagnoses:  Acute on chronic congestive heart failure, unspecified heart failure type [facility])    Rx / DC Orders ED Discharge Orders     None         Michelle Piper, PA-C 09/23/23 1738    Glyn Ade, MD 09/23/23 [phone]

## 2023-09-24 ENCOUNTER — Inpatient Hospital Stay
Admission: RE | Admit: 2023-09-24 | Discharge: 2023-09-24 | Disposition: A | Discharge status: Home / Self Care | Payer: Medicare PPO | Source: Ambulatory Visit | Attending: Interventional Radiology | Admitting: Interventional Radiology

## 2023-09-24 ENCOUNTER — Encounter (HOSPITAL_COMMUNITY): Payer: Self-pay | Admitting: Internal Medicine

## 2023-09-24 DIAGNOSIS — M109 Gout, unspecified: Secondary | ICD-10-CM

## 2023-09-24 DIAGNOSIS — I10 Essential (primary) hypertension: Secondary | ICD-10-CM

## 2023-09-24 DIAGNOSIS — D649 Anemia, unspecified: Secondary | ICD-10-CM | POA: Diagnosis not present

## 2023-09-24 DIAGNOSIS — N1831 Chronic kidney disease, stage 3a: Secondary | ICD-10-CM | POA: Diagnosis present

## 2023-09-24 DIAGNOSIS — J918 Pleural effusion in other conditions classified elsewhere: Secondary | ICD-10-CM | POA: Diagnosis not present

## 2023-09-24 DIAGNOSIS — I25810 Atherosclerosis of coronary artery bypass graft(s) without angina pectoris: Secondary | ICD-10-CM

## 2023-09-24 DIAGNOSIS — K8012 Calculus of gallbladder with acute and chronic cholecystitis without obstruction: Secondary | ICD-10-CM | POA: Diagnosis present

## 2023-09-24 DIAGNOSIS — I4819 Other persistent atrial fibrillation: Secondary | ICD-10-CM | POA: Diagnosis not present

## 2023-09-24 DIAGNOSIS — I130 Hypertensive heart and chronic kidney disease with heart failure and stage 1 through stage 4 chronic kidney disease, or unspecified chronic kidney disease: Secondary | ICD-10-CM | POA: Diagnosis present

## 2023-09-24 DIAGNOSIS — I480 Paroxysmal atrial fibrillation: Secondary | ICD-10-CM | POA: Diagnosis present

## 2023-09-24 DIAGNOSIS — E785 Hyperlipidemia, unspecified: Secondary | ICD-10-CM

## 2023-09-24 DIAGNOSIS — E119 Type 2 diabetes mellitus without complications: Secondary | ICD-10-CM

## 2023-09-24 DIAGNOSIS — J9601 Acute respiratory failure with hypoxia: Secondary | ICD-10-CM | POA: Insufficient documentation

## 2023-09-24 DIAGNOSIS — R6510 Systemic inflammatory response syndrome (SIRS) of non-infectious origin without acute organ dysfunction: Secondary | ICD-10-CM | POA: Diagnosis not present

## 2023-09-24 DIAGNOSIS — I2510 Atherosclerotic heart disease of native coronary artery without angina pectoris: Secondary | ICD-10-CM | POA: Diagnosis present

## 2023-09-24 DIAGNOSIS — J1000 Influenza due to other identified influenza virus with unspecified type of pneumonia: Secondary | ICD-10-CM | POA: Diagnosis present

## 2023-09-24 DIAGNOSIS — E871 Hypo-osmolality and hyponatremia: Secondary | ICD-10-CM | POA: Diagnosis not present

## 2023-09-24 DIAGNOSIS — I255 Ischemic cardiomyopathy: Secondary | ICD-10-CM | POA: Diagnosis present

## 2023-09-24 DIAGNOSIS — I509 Heart failure, unspecified: Secondary | ICD-10-CM | POA: Diagnosis present

## 2023-09-24 DIAGNOSIS — E1122 Type 2 diabetes mellitus with diabetic chronic kidney disease: Secondary | ICD-10-CM | POA: Diagnosis present

## 2023-09-24 DIAGNOSIS — I5021 Acute systolic (congestive) heart failure: Secondary | ICD-10-CM | POA: Diagnosis present

## 2023-09-24 DIAGNOSIS — Z1152 Encounter for screening for COVID-19: Secondary | ICD-10-CM | POA: Diagnosis not present

## 2023-09-24 DIAGNOSIS — E8809 Other disorders of plasma-protein metabolism, not elsewhere classified: Secondary | ICD-10-CM | POA: Diagnosis present

## 2023-09-24 DIAGNOSIS — I5043 Acute on chronic combined systolic (congestive) and diastolic (congestive) heart failure: Secondary | ICD-10-CM

## 2023-09-24 DIAGNOSIS — J101 Influenza due to other identified influenza virus with other respiratory manifestations: Secondary | ICD-10-CM | POA: Diagnosis not present

## 2023-09-24 DIAGNOSIS — J40 Bronchitis, not specified as acute or chronic: Secondary | ICD-10-CM | POA: Diagnosis present

## 2023-09-24 DIAGNOSIS — D631 Anemia in chronic kidney disease: Secondary | ICD-10-CM | POA: Diagnosis present

## 2023-09-24 DIAGNOSIS — Z7901 Long term (current) use of anticoagulants: Secondary | ICD-10-CM | POA: Diagnosis not present

## 2023-09-24 DIAGNOSIS — G9341 Metabolic encephalopathy: Secondary | ICD-10-CM | POA: Diagnosis not present

## 2023-09-24 DIAGNOSIS — J189 Pneumonia, unspecified organism: Secondary | ICD-10-CM | POA: Diagnosis present

## 2023-09-24 DIAGNOSIS — Z79890 Hormone replacement therapy (postmenopausal): Secondary | ICD-10-CM | POA: Diagnosis not present

## 2023-09-24 DIAGNOSIS — N400 Enlarged prostate without lower urinary tract symptoms: Secondary | ICD-10-CM | POA: Diagnosis present

## 2023-09-24 DIAGNOSIS — I5023 Acute on chronic systolic (congestive) heart failure: Secondary | ICD-10-CM | POA: Diagnosis not present

## 2023-09-24 LAB — BASIC METABOLIC PANEL
Anion gap: 8 (ref 5–15)
BUN: 22 mg/dL (ref 8–23)
CO2: 25 mmol/L (ref 22–32)
Calcium: 7.6 mg/dL — ABNORMAL LOW (ref 8.9–10.3)
Chloride: 104 mmol/L (ref 98–111)
Creatinine, Ser: 0.91 mg/dL (ref 0.61–1.24)
GFR, Estimated: >60 mL/min (ref >60)
Glucose, Bld: 165 mg/dL — ABNORMAL HIGH (ref 70–99)
Potassium: 3.3 mmol/L — ABNORMAL LOW (ref 3.5–5.1)
Sodium: 137 mmol/L (ref 135–145)

## 2023-09-24 LAB — MAGNESIUM: Magnesium: 2.1 mg/dL (ref 1.7–2.4)

## 2023-09-24 LAB — RESP PANEL BY RT-PCR (RSV, FLU A&B, COVID)  RVPGX2
Influenza A by PCR: NEGATIVE
Influenza B by PCR: NEGATIVE
Resp Syncytial Virus by PCR: NEGATIVE
SARS Coronavirus 2 by RT PCR: NEGATIVE

## 2023-09-24 LAB — GLUCOSE, CAPILLARY: Glucose-Capillary: 214 mg/dL — ABNORMAL HIGH (ref 70–99)

## 2023-09-24 MED ORDER — POLYETHYLENE GLYCOL 3350 17 G PO PACK: 17.0000 g | PACK | Freq: Every day | ORAL | Status: DC | PRN

## 2023-09-24 MED ORDER — POTASSIUM CHLORIDE CRYS ER 20 MEQ PO TBCR
40.0000 meq | EXTENDED_RELEASE_TABLET | Freq: Once | ORAL | Status: AC
  Administered 2023-09-24: 40 meq via ORAL
  Filled 2023-09-24: qty 2

## 2023-09-24 MED ORDER — ACETAMINOPHEN 650 MG RE SUPP: 650.0000 mg | Freq: Four times a day (QID) | RECTAL | Status: DC | PRN

## 2023-09-24 MED ORDER — ACETAMINOPHEN 325 MG PO TABS
650.0000 mg | ORAL_TABLET | Freq: Four times a day (QID) | ORAL | Status: DC | PRN
  Administered 2023-09-24 – 2023-09-30 (×8): 650 mg via ORAL
  Filled 2023-09-24 (×8): qty 2

## 2023-09-24 MED ORDER — CARVEDILOL 3.125 MG PO TABS
3.1250 mg | ORAL_TABLET | Freq: Two times a day (BID) | ORAL | Status: DC
  Administered 2023-09-24 – 2023-10-01 (×14): 3.125 mg via ORAL
  Filled 2023-09-24 (×14): qty 1

## 2023-09-24 MED ORDER — ROSUVASTATIN CALCIUM 5 MG PO TABS
5.0000 mg | ORAL_TABLET | Freq: Every day | ORAL | Status: DC
  Administered 2023-09-25 – 2023-10-01 (×7): 5 mg via ORAL
  Filled 2023-09-24 (×7): qty 1

## 2023-09-24 MED ORDER — FUROSEMIDE 10 MG/ML IJ SOLN
40.0000 mg | Freq: Two times a day (BID) | INTRAMUSCULAR | Status: DC
  Administered 2023-09-24 – 2023-09-25 (×2): 40 mg via INTRAVENOUS
  Filled 2023-09-24 (×2): qty 4

## 2023-09-24 MED ORDER — INSULIN ASPART 100 UNIT/ML IJ SOLN
0.0000 [IU] | Freq: Three times a day (TID) | INTRAMUSCULAR | Status: DC
  Administered 2023-09-25 – 2023-09-26 (×2): 1 [IU] via SUBCUTANEOUS
  Administered 2023-09-26: 3 [IU] via SUBCUTANEOUS
  Administered 2023-09-27: 2 [IU] via SUBCUTANEOUS
  Administered 2023-09-28: 1 [IU] via SUBCUTANEOUS
  Administered 2023-09-30: 3 [IU] via SUBCUTANEOUS

## 2023-09-24 MED ORDER — LEVOTHYROXINE SODIUM 25 MCG PO TABS
125.0000 ug | ORAL_TABLET | Freq: Every day | ORAL | Status: DC
  Administered 2023-09-25 – 2023-10-01 (×7): 125 ug via ORAL
  Filled 2023-09-24 (×7): qty 1

## 2023-09-24 MED ORDER — ALLOPURINOL 100 MG PO TABS
100.0000 mg | ORAL_TABLET | Freq: Every day | ORAL | Status: DC
  Administered 2023-09-24 – 2023-09-30 (×7): 100 mg via ORAL
  Filled 2023-09-24 (×7): qty 1

## 2023-09-24 MED ORDER — LATANOPROST 0.005 % OP SOLN
1.0000 [drp] | Freq: Every day | OPHTHALMIC | Status: DC
  Administered 2023-09-24 – 2023-09-30 (×7): 1 [drp] via OPHTHALMIC
  Filled 2023-09-24: qty 2.5

## 2023-09-24 MED ORDER — SODIUM CHLORIDE 0.9% FLUSH
3.0000 mL | Freq: Two times a day (BID) | INTRAVENOUS | Status: DC
  Administered 2023-09-24 – 2023-10-01 (×12): 3 mL via INTRAVENOUS

## 2023-09-24 MED ORDER — FUROSEMIDE 10 MG/ML IJ SOLN
40.0000 mg | Freq: Once | INTRAMUSCULAR | Status: AC
Start: 2023-09-24 — End: 2023-09-24
  Administered 2023-09-24: 40 mg via INTRAVENOUS
  Filled 2023-09-24: qty 4

## 2023-09-24 MED ORDER — APIXABAN 5 MG PO TABS
5.0000 mg | ORAL_TABLET | Freq: Two times a day (BID) | ORAL | Status: DC
  Administered 2023-09-24 – 2023-09-28 (×9): 5 mg via ORAL
  Filled 2023-09-24 (×4): qty 1
  Filled 2023-09-24: qty 2
  Filled 2023-09-24 (×4): qty 1

## 2023-09-24 NOTE — ED Notes (Signed)
Pt oxygen dropping to mid-upper 80%. Pt was resting. Respiratory notified and pt placed on 2L/[place].

## 2023-09-24 NOTE — Progress Notes (Addendum)
Pt arrived via ambulance from [facility] at approx 1700.  Pt assisted from stretcher to bed. Pt oriented to unit, call bell, tv, bed alarm, poc, dx.  Pt placement notified of pt's arrival.  Skin check done with 2nd RN, CHG completed. Pt c/o an ingrown toenail to his right great toe. Toe assessed, not reddened, swollen, or warm.   Pt on 2L [place], DOE.  Arrived with 1 hearing aid in the left ear, none in the right.  Also came with 1 extra battery.  Pt's daughter [name] at the bedside.  All questions answered at this time.  Call bell within reach, bed alarm on for safety.  WCTM

## 2023-09-24 NOTE — ED Notes (Signed)
 Called [facility] for transport, pt bed assignment is ready

## 2023-09-24 NOTE — H&P (Addendum)
History and Physical   TRAYLIN FILBERT ZOX:[id number] DOB: [birth year] DOA: 09/23/2023  PCP: Kaleen Mask, MD   Patient coming from: Home  Chief Complaint: Home  HPI: [name] is a 83 y.o. male with medical history significant of hypertension, hyperlipidemia, atrial fibrillation, CAD status post stent, chronic combined systolic and diastolic CHF, AICD, anemia, gout, diabetes, CKD 3 AA presenting with worsening shortness of breath.  Patient presented yesterday with shortness of breath that PT session.  PT provider called patient's interventional radiologist who recommended ED evaluation.  Patient followed by IR due to acute cholecystitis in October when he was deemed not a surgical candidate.  IR placed cholecystostomy tube.  Patient then underwent successful percutaneous gallstone removal on 1/14.  Patient does report some shortness of breath that he thought was secondary to his right upper quadrant pain limiting his inspiration.  This pain has been improving after his procedure on 1/14.  Of note, he was transiently off Eliquis for 2 days prior to procedure and resumed it immediately afterwards.  Denies fevers, chills, chest pain, constipation, diarrhea, nausea, vomiting.**  ED Course: Vital signs in the ED notable for blood pressure in the 100s 130 systolic, respiratory rate in the teens to 20s,  requiring 2 L to maintain saturations.  Lab workup included BMP with sodium 133, bicarb 21, BUN 25, glucose 117, calcium 8.8.  CBC with hemoglobin stable 12.4.  BNP elevated to 456.  Troponin flat at 25 and then 27 on repeat.  Rester panel for flu COVID RSV negative.  Chest x-ray with cardiomegaly, moderate right pleural effusion, vascular congestion.  Patient received Lasix 40 mg IV yesterday evening and this morning in the ED, also received his home Eliquis.  Review of Systems: As per HPI otherwise all other systems reviewed and are negative.  Past Medical History:  Diagnosis Date    AICD (automatic cardioverter/defibrillator) present    Anginal pain (HCC)    Arthritis    knees, back    BPH (benign prostatic hypertrophy)    CHF (congestive heart failure) (HCC)    Chronic kidney disease    BPH   Chronic systolic heart failure (HCC)    a. s/p MDT single chamber ICD 2004 as part of MASTER study b. upgrade to CRTD 2010; CRTD gen change 2016   Coronary artery disease    a. s/p anterior MI and CYPHER stent to LAD 2005   Dyslipidemia    Dyspnea    Dysrhythmia    a-fib   Gout    Ischemic cardiomyopathy    Paroxysmal atrial fibrillation (HCC)    Ventricular tachycardia (HCC)     Past Surgical History:  Procedure Laterality Date   ANAL RECTAL MANOMETRY N/A 06/23/2023   Procedure: ANO RECTAL MANOMETRY;  Surgeon: Napoleon Form, MD;  Location: [facility] ENDOSCOPY;  Service: Gastroenterology;  Laterality: N/A;   APPENDECTOMY  1970   BI-VENTRICULAR IMPLANTABLE CARDIOVERTER DEFIBRILLATOR UPGRADE N/A 11/07/2014   a. MDT single chamber ICD implanted 2004 as part of MASTER study; upgrade to CRTD 2010; gen change 2016   BIV ICD GENERATOR CHANGEOUT N/A 11/12/2021   Procedure: BIV ICD GENERATOR CHANGEOUT;  Surgeon: Duke Salvia, MD;  Location: [facility] INVASIVE CV LAB;  Service: Cardiovascular;  Laterality: N/A;   CARDIAC CATHETERIZATION  09/08/2003   x3 total of 5 stents   CARDIOVERSION N/A 07/04/2019   Procedure: CARDIOVERSION;  Surgeon: Wendall Stade, MD;  Location: [facility] ENDOSCOPY;  Service: Cardiovascular;  Laterality: N/A;   CARDIOVERSION N/A  08/17/2019   Procedure: CARDIOVERSION;  Surgeon: Elease Hashimoto Deloris Ping, MD;  Location: [facility] ENDOSCOPY;  Service: Cardiovascular;  Laterality: N/A;   CARDIOVERSION N/A 05/04/2022   Procedure: CARDIOVERSION;  Surgeon: Quintella Reichert, MD;  Location: [facility] ENDOSCOPY;  Service: Cardiovascular;  Laterality: N/A;   IR EXCHANGE BILIARY DRAIN  08/11/2023   IR PERC CHOLECYSTOSTOMY  06/29/2023   IR RADIOLOGIST EVAL & MGMT  08/27/2023   IR REMOVAL OF  CALCULI/DEBRIS BILIARY DUCT/GB  09/21/2023   LEFT HEART CATH AND CORONARY ANGIOGRAPHY N/A 08/20/2023   Procedure: LEFT HEART CATH AND CORONARY ANGIOGRAPHY;  Surgeon: Elder Negus, MD;  Location: [facility] INVASIVE CV LAB;  Service: Cardiovascular;  Laterality: N/A;   PROSTATECTOMY  11/06/2011   Procedure: PROSTATECTOMY SUPRAPUBIC;  Surgeon: Kathi Ludwig, MD;  Location: [facility] ORS;  Service: Urology;  Laterality: N/A;  Open Suprapubic Prostatectomy   TEE WITHOUT CARDIOVERSION N/A 05/16/2019   Procedure: TRANSESOPHAGEAL ECHOCARDIOGRAM (TEE);  Surgeon: Sande Rives, MD;  Location: [facility] ENDOSCOPY;  Service: Cardiology;  Laterality: N/A;   TOTAL KNEE ARTHROPLASTY Left 10/06/2021   Procedure: TOTAL KNEE ARTHROPLASTY;  Surgeon: Jodi Geralds, MD;  Location: [facility] ORS;  Service: Orthopedics;  Laterality: Left;    Social History  reports that he has never smoked. He has never been exposed to tobacco smoke. He has never used smokeless tobacco. He reports that he does not drink alcohol and does not use drugs.  Allergies  Allergen Reactions   Lipitor [Atorvastatin] Other (See Comments)    MUSCLE ACHE    Jardiance [Empagliflozin] Other (See Comments)    Intolerance     Family History  Problem Relation Age of Onset   Heart disease Sister        3 49f the 4 had HD   Heart disease Brother        2 of 3 had HD   Heart disease Sister        1 of the 6 has HD   Heart disease Brother   Reviewed on admission  Prior to Admission medications   Medication Sig Start Date End Date Taking? Authorizing Provider  acetaminophen (TYLENOL) 325 MG tablet Take 2 tablets (650 mg total) by mouth every 4 (four) hours as needed for headache or mild pain. 05/27/23  Yes Sheilah Pigeon, PA-C  allopurinol (ZYLOPRIM) 100 MG tablet Take 100 mg by mouth at bedtime. 05/14/15  Yes [provider]  apixaban (ELIQUIS) 5 MG TABS tablet Take 5 mg by mouth 2 (two) times daily.   Yes [provider]   carvedilol (COREG) 3.125 MG tablet TAKE 1 TABLET TWICE DAILY 07/29/23  Yes Nahser, Deloris Ping, MD  furosemide (LASIX) 40 MG tablet Take 40 mg by mouth every other day.   Yes [provider]  latanoprost (XALATAN) 0.005 % ophthalmic solution Place 1 drop into both eyes at bedtime.  10/03/13  Yes [provider]  levothyroxine (SYNTHROID) 125 MCG tablet Take 1 tablet (125 mcg total) by mouth daily before breakfast. 08/03/23  Yes Nahser, Deloris Ping, MD  nitroGLYCERIN (NITROSTAT) 0.4 MG SL tablet Place 1 tablet (0.4 mg total) under the tongue every 5 (five) minutes as needed for chest pain. 12/12/19  Yes Nahser, Deloris Ping, MD  potassium chloride (KLOR-CON M) 10 MEQ tablet Take 10 mEq by mouth every other day.   Yes [provider]  rosuvastatin (CRESTOR) 10 MG tablet TAKE 1/2 TABLET EVERY DAY 03/24/23  Yes Nahser, Deloris Ping, MD  sodium chloride flush (NS) 0.9 %  SOLN Flush gallbladder drain once daily with at least 5 ml normal saline. Discard extra 5ml after use of the 10ml syringe 06/30/23  Yes Covington, Arman Filter, NP  chlorpheniramine-HYDROcodone (TUSSIONEX) 10-8 MG/5ML Take 5 mLs by mouth every 12 (twelve) hours as needed. Patient not taking: Reported on 09/23/2023 08/16/23   [provider]  doxycycline (VIBRAMYCIN) 100 MG capsule Take 100 mg by mouth 2 (two) times daily. Patient not taking: Reported on 09/23/2023 08/16/23   [provider]  methylPREDNISolone (MEDROL DOSEPAK) 4 MG TBPK tablet Take 4 mg by mouth. Patient not taking: Reported on 09/23/2023 08/16/23   [provider]    Physical Exam: Vitals:   09/24/23 1225 09/24/23 1405 09/24/23 1600 09/24/23 1702  BP:  136/75 115/73 128/88  Pulse:  77 86 87  Resp:  20 (!) 25 18  Temp: 97.8 F (36.6 C)  97.8 F (36.6 C) 99.2 F (37.3 C)  TempSrc: Oral  Oral Oral  SpO2:  96% 93%   Weight:    99.9 kg  Height:    5\' 10"  (1.778 m)    Physical Exam Constitutional:      General: He is not in acute  distress.    Appearance: Normal appearance.  HENT:     Head: Normocephalic and atraumatic.     Mouth/Throat:     Mouth: Mucous membranes are moist.     Pharynx: Oropharynx is clear.  Eyes:     Extraocular Movements: Extraocular movements intact.     Pupils: Pupils are equal, round, and reactive to light.  Cardiovascular:     Rate and Rhythm: Normal rate. Rhythm irregular.     Pulses: Normal pulses.     Heart sounds: Normal heart sounds.     Comments: Trace LE edema Pulmonary:     Effort: Pulmonary effort is normal. No respiratory distress.     Breath sounds: Rales present.  Abdominal:     General: Bowel sounds are normal. There is no distension.     Palpations: Abdomen is soft.     Tenderness: There is no abdominal tenderness.  Musculoskeletal:        General: No swelling or deformity.  Skin:    General: Skin is warm and dry.  Neurological:     General: No focal deficit present.     Mental Status: Mental status is at baseline.    Labs on Admission: I have personally reviewed following labs and imaging studies  CBC: Recent Labs  Lab 09/21/23 0840 09/23/23 1727  WBC 9.5 10.3  NEUTROABS 7.2 8.1*  HGB 13.6 12.4*  HCT 43.2 37.5*  MCV 97.7 93.1  PLT 245 212    Basic Metabolic Panel: Recent Labs  Lab 09/21/23 0840 09/23/23 1727 09/24/23 0922  NA 135 133* 137  K 3.9 3.8 3.3*  CL 103 102 104  CO2 24 21* 25  GLUCOSE 122* 117* 165*  BUN 19 25* 22  CREATININE 0.89 1.03 0.91  CALCIUM 8.8* 8.8* 7.6*    GFR: Estimated Creatinine Clearance: 72.9 mL/min (by C-G formula based on SCr of 0.91 mg/dL).  Liver Function Tests: Recent Labs  Lab 09/21/23 0840  AST 19  ALT 12  ALKPHOS 80  BILITOT 2.3*  PROT 7.2  ALBUMIN 3.7    Urine analysis:    Component Value Date/Time   COLORURINE YELLOW 01/24/2019 1400   APPEARANCEUR CLEAR 01/24/2019 1400   LABSPEC >1.046 (H) 01/24/2019 1400   PHURINE 6.0 01/24/2019 1400   GLUCOSEU NEGATIVE 01/24/2019 1400  HGBUR  NEGATIVE 01/24/2019 1400   BILIRUBINUR NEGATIVE 01/24/2019 1400   KETONESUR NEGATIVE 01/24/2019 1400   PROTEINUR NEGATIVE 01/24/2019 1400   NITRITE NEGATIVE 01/24/2019 1400   LEUKOCYTESUR NEGATIVE 01/24/2019 1400    Radiological Exams on Admission: DG Chest 2 View Result Date: 09/23/2023 CLINICAL DATA:  Shortness of breath. EXAM: CHEST - 2 VIEW COMPARISON:  Radiograph 06/26/2023 FINDINGS: The heart is enlarged. Coronary stent visualized. Right pleural effusion, at least moderate in size. Bandlike opacity in the right mid lung may represent fluid in the fissure. Mild vascular congestion. No pneumothorax. Mild thoracic spondylosis. Drainage catheter in the upper abdomen is seen only on the lateral view. IMPRESSION: Cardiomegaly. Moderate pleural effusion with fluid in the right minor fissure. Vascular congestion. Recommend correlation for fluid overload. Electronically Signed   By: Narda Rutherford M.D.   On: 09/23/2023 16:56   EKG: Independently reviewed.  Atrial fibrillation at 83 bpm.  PVC noted.  Left bundle branch block with QRS 158.  Similar to previous.  Assessment/Plan Principal Problem:   Acute on chronic combined systolic and diastolic CHF (congestive heart failure) (HCC) Active Problems:   Essential hypertension, benign   Atrial fibrillation (HCC)   Coronary artery disease involving coronary bypass graft of native heart without angina pectoris   Dyslipidemia   Gout   Anemia   Biventricular implantable cardioverter-defibrillator in situ   Controlled type 2 diabetes mellitus without complication (HCC)   Stage 3a chronic kidney disease (HCC)   Acute chronic combined systolic diastolic CHF Acute respiratory failure with hypoxia Status post AICD (Now removed OCT 24 2/2 Pocket infection) > Last echo in October 2024 showed EF 25-30%, indeterminate diastolic function, moderately reduced RV function.  Severely dilated left atrium and moderately dilated right atrium. > Patient  presenting with shortness of breath.  As per HPI occurred at PT and patient attributed to right upper quadrant pain with bending his inspiration. > Noted to have BNP of 456.  Chest x-ray with vascular congestion but also moderate right pleural effusion. > Was requiring 2 L to maintain saturations in the ED. > Received Lasix yesterday evening and this morning while awaiting transfer from [facility] ED. - Monitor on telemetry - Lasix 40 mg IV twice daily - Strict I's and O's, daily weights - Hold off on echocardiogram as most recent one was 3 months ago. - Check magnesium - Trend renal function and electrolytes - Supportive care  Right pleural effusion > Moderate right pleural effusion noted on chest x-ray. > Patient's oxygenation fails to improve with treatment of his CHF exacerbation and perfusion persists, may need to consider thoracentesis. - Continue to monitor for now  Hypertension - Continue home carvedilol - Lasix as above  Hyperlipidemia - Continue rosuvastatin  Atrial fibrillation - Continue home carvedilol and Eliquis  CAD > History of stenting - Continue home carvedilol, rosuvastatin, Eliquis  Anemia > Hemoglobin stable at 12.4 - Trend CBC  Gout - Continue home allopurinol  Diabetes - SSI  CKD 3A > Creatinine stable in the ED - Trend renal function and electrolytes  DVT prophylaxis: Eliquis Code Status:   Full Family Communication:  Updated at beside  Disposition Plan:   Patient is from:  Home  Anticipated DC to:  Home  Anticipated DC date:  2 to 3 days  Anticipated DC barriers: None  Consults called:  None Admission status:  Inpatient, telemetry  Severity of Illness: The appropriate patient status for this patient is INPATIENT. Inpatient status is judged to be reasonable  and necessary in order to provide the required intensity of service to ensure the patient's safety. The patient's presenting symptoms, physical exam findings, and initial  radiographic and laboratory data in the context of their chronic comorbidities is felt to place them at high risk for further clinical deterioration. Furthermore, it is not anticipated that the patient will be medically stable for discharge from the hospital within 2 midnights of admission.   * I certify that at the point of admission it is my clinical judgment that the patient will require inpatient hospital care spanning beyond 2 midnights from the point of admission due to high intensity of service, high risk for further deterioration and high frequency of surveillance required.Synetta Fail MD [facility]  How to contact the [facility] Attending or Consulting provider 7A - 7P or covering provider during after hours 7P -7A, for this patient?   Check the care team in [facility] and look for a) attending/consulting [facility] provider listed and b) the [facility] team listed Log into [email or web address] and use [facility] universal password to access. If you do not have the password, please contact the hospital operator. Locate the [facility] provider you are looking for under [facility] and page to a number that you can be directly reached. If you still have difficulty reaching the provider, please page the [facility] (Director on Call) for the Hospitalists listed on amion for assistance.  09/24/2023, 6:23 PM

## 2023-09-24 NOTE — ED Notes (Signed)
Pt back to bed.

## 2023-09-24 NOTE — ED Notes (Signed)
Pt up to chair sitting up eating breakfast. 1 Oatmeal, 1 packet of grits, 2 orange juice.

## 2023-09-25 ENCOUNTER — Inpatient Hospital Stay (HOSPITAL_COMMUNITY): Payer: Medicare PPO

## 2023-09-25 DIAGNOSIS — I5043 Acute on chronic combined systolic (congestive) and diastolic (congestive) heart failure: Secondary | ICD-10-CM | POA: Diagnosis not present

## 2023-09-25 LAB — COMPREHENSIVE METABOLIC PANEL
ALT: 27 U/L (ref 0–44)
AST: 44 U/L — ABNORMAL HIGH (ref 15–41)
Albumin: 2.4 g/dL — ABNORMAL LOW (ref 3.5–5.0)
Alkaline Phosphatase: 111 U/L (ref 38–126)
Anion gap: 8 (ref 5–15)
BUN: 22 mg/dL (ref 8–23)
CO2: 25 mmol/L (ref 22–32)
Calcium: 8.1 mg/dL — ABNORMAL LOW (ref 8.9–10.3)
Chloride: 101 mmol/L (ref 98–111)
Creatinine, Ser: 1.13 mg/dL (ref 0.61–1.24)
GFR, Estimated: >60 mL/min (ref >60)
Glucose, Bld: 119 mg/dL — ABNORMAL HIGH (ref 70–99)
Potassium: 3.5 mmol/L (ref 3.5–5.1)
Sodium: 134 mmol/L — ABNORMAL LOW (ref 135–145)
Total Bilirubin: 0.9 mg/dL (ref 0.0–1.2)
Total Protein: 6 g/dL — ABNORMAL LOW (ref 6.5–8.1)

## 2023-09-25 LAB — CBC
HCT: 37.6 % — ABNORMAL LOW (ref 39.0–52.0)
Hemoglobin: 12.1 g/dL — ABNORMAL LOW (ref 13.0–17.0)
MCH: 30.3 pg (ref 26.0–34.0)
MCHC: 32.2 g/dL (ref 30.0–36.0)
MCV: 94 fL (ref 80.0–100.0)
Platelets: 250 10*3/uL (ref 150–400)
RBC: 4 MIL/uL — ABNORMAL LOW (ref 4.22–5.81)
RDW: 13.8 % (ref 11.5–15.5)
WBC: 8.3 10*3/uL (ref 4.0–10.5)
nRBC: 0 % (ref 0.0–0.2)

## 2023-09-25 LAB — GLUCOSE, CAPILLARY
Glucose-Capillary: 107 mg/dL — ABNORMAL HIGH (ref 70–99)
Glucose-Capillary: 125 mg/dL — ABNORMAL HIGH (ref 70–99)
Glucose-Capillary: 108 mg/dL — ABNORMAL HIGH (ref 70–99)
Glucose-Capillary: 194 mg/dL — ABNORMAL HIGH (ref 70–99)

## 2023-09-25 MED ORDER — FUROSEMIDE 10 MG/ML IJ SOLN
60.0000 mg | Freq: Two times a day (BID) | INTRAMUSCULAR | Status: DC
  Administered 2023-09-25 – 2023-09-26 (×2): 60 mg via INTRAVENOUS
  Filled 2023-09-25 (×2): qty 6

## 2023-09-25 MED ORDER — SPIRONOLACTONE 25 MG PO TABS
25.0000 mg | ORAL_TABLET | Freq: Every day | ORAL | Status: DC
  Administered 2023-09-25 – 2023-09-26 (×2): 25 mg via ORAL
  Filled 2023-09-25 (×2): qty 1

## 2023-09-25 MED ORDER — ALBUMIN HUMAN 25 % IV SOLN
25.0000 g | Freq: Four times a day (QID) | INTRAVENOUS | Status: AC
  Administered 2023-09-25 (×2): 25 g via INTRAVENOUS
  Filled 2023-09-25 (×2): qty 100

## 2023-09-25 MED ORDER — GUAIFENESIN-DM 100-10 MG/5ML PO SYRP
5.0000 mL | ORAL_SOLUTION | ORAL | Status: DC | PRN
  Administered 2023-09-25 – 2023-09-30 (×8): 5 mL via ORAL
  Filled 2023-09-25 (×8): qty 5

## 2023-09-25 NOTE — Care Plan (Signed)
Pt progressing as pt having success with diuresis as evidenced by urine out put moderate

## 2023-09-25 NOTE — Evaluation (Signed)
Occupational Therapy Evaluation Patient Details Name: [name] MRN: [id number] DOB: 11/17/1940 Today's Date: 09/25/2023   History of Present Illness 83 y/o M presenting to ED on 1/16 with SOB, admitted for acute on chronic combined systolic and diastolic CHF. Pt being followed by IR for acute cholecysititis, biliary drain placed 1/14.     PMH includes paroxysmal A fib on Eliquis, CHF, CKD, dyslipidemia   Clinical Impression   Pt reports ind at baseline with ADL and uses RW/cane for functional mobility. Pt lives alone but reports daughter lives nearby and can check on him. Pt needing up to mod A for ADLs, CGA for bed mobility and CGA for transfers with RW. Pt with incr neck/back pain he reports is chronic, able to ambulate household distance in room with RW, SpO2 reading poorly but high 80's-low 90's when reading well, SpO2 93% at end of session on RA, RN notified. Pt presenting with impairments listed below, will follow acutely. Anticipate when pain more controlled pt will be better able to mobilize/perform ADLs, recommend HHOT at d/c pending progression.       If plan is discharge home, recommend the following: A little help with walking and/or transfers;A little help with bathing/dressing/bathroom;Assistance with cooking/housework;Direct supervision/assist for medications management;Direct supervision/assist for financial management;Assist for transportation;Help with stairs or ramp for entrance    Functional Status Assessment  Patient has had a recent decline in their functional status and demonstrates the ability to make significant improvements in function in a reasonable and predictable amount of time.  Equipment Recommendations  None recommended by OT (pt has all needed DME)    Recommendations for Other Services PT consult     Precautions / Restrictions Precautions Precautions: Fall Precaution Comments: biliary drain R side Restrictions Weight Bearing Restrictions Per  Provider Order: No      Mobility Bed Mobility Overal bed mobility: Needs Assistance Bed Mobility: Supine to Sit, Sit to Supine     Supine to sit: Contact guard Sit to supine: Contact guard assist        Transfers Overall transfer level: Needs assistance Equipment used: Rolling walker (2 wheels) Transfers: Sit to/from Stand Sit to Stand: Contact guard assist                  Balance Overall balance assessment: Needs assistance Sitting-balance support: Feet supported Sitting balance-Leahy Scale: Good     Standing balance support: During functional activity, Reliant on assistive device for balance Standing balance-Leahy Scale: Poor                             ADL either performed or assessed with clinical judgement   ADL Overall ADL's : Needs assistance/impaired Eating/Feeding: Set up;Sitting   Grooming: Minimal assistance;Standing   Upper Body Bathing: Sitting;Minimal assistance   Lower Body Bathing: Moderate assistance;Sitting/lateral leans   Upper Body Dressing : Sitting;Minimal assistance   Lower Body Dressing: Moderate assistance;Sitting/lateral leans   Toilet Transfer: Contact guard assist;Ambulation;Regular Toilet;Rolling walker (2 wheels)   Toileting- Clothing Manipulation and Hygiene: Contact guard assist       Functional mobility during ADLs: Contact guard assist;Rolling walker (2 wheels)       Vision Baseline Vision/History: 1 Wears glasses Vision Assessment?: No apparent visual deficits     Perception Perception: Not tested       Praxis Praxis: Not tested       Pertinent Vitals/Pain Pain Assessment Pain Assessment: Faces Pain Score: 8  Faces Pain  Scale: Hurts whole lot Pain Location: back Pain Descriptors / Indicators: Discomfort, Grimacing, Guarding Pain Intervention(s): Limited activity within patient's tolerance, Monitored during session, Repositioned     Extremity/Trunk Assessment Upper Extremity  Assessment Upper Extremity Assessment: Generalized weakness   Lower Extremity Assessment Lower Extremity Assessment: Defer to PT evaluation   Cervical / Trunk Assessment Cervical / Trunk Assessment:  (back and neck pain)   Communication Communication Communication: No apparent difficulties   Cognition Arousal: Alert Behavior During Therapy: WFL for tasks assessed/performed, Flat affect Overall Cognitive Status: No family/caregiver present to determine baseline cognitive functioning                                 General Comments: decr awareness of deficits, internally distracted by neck/back pain throughout session     General Comments  SPO2 with poor pleth on RA, reading 88-93% on RA once seated/supine    Exercises     Shoulder Instructions      Home Living Family/patient expects to be discharged to:: Private residence Living Arrangements: Alone Available Help at Discharge: Family;Available PRN/intermittently Type of Home: House Home Access: Stairs to enter Entrance Stairs-Number of Steps: 2 Entrance Stairs-Rails: None Home Layout: Able to live on main level with bedroom/bathroom;Laundry or work area in basement     [employer] Shower/Tub: [employer] Equipment: Agricultural consultant (2 wheels);Cane - single point;Shower seat;Grab bars - tub/shower;Hand held shower head;Grab bars - toilet          Prior Functioning/Environment Prior Level of Function : Independent/Modified Independent;Driving             Mobility Comments: uses RW and cane for mobility ADLs Comments: ind with ADL        OT Problem List: Decreased strength;Decreased range of motion;Decreased activity tolerance;Impaired balance (sitting and/or standing)      OT Treatment/Interventions: Self-care/ADL training;Therapeutic exercise;Energy conservation;DME and/or AE instruction;Therapeutic activities;Balance training;Patient/family education    OT Goals(Current goals  can be found in the care plan section) Acute Rehab OT Goals Patient Stated Goal: none stated OT Goal Formulation: With patient Time For Goal Achievement: 10/09/23 Potential to Achieve Goals: Good ADL Goals Pt Will Perform Upper Body Dressing: with contact guard assist;sitting Pt Will Perform Lower Body Dressing: with contact guard assist;sit to/from stand;sitting/lateral leans Pt Will Transfer to Toilet: with contact guard assist;ambulating;regular height toilet Pt Will Perform Tub/Shower Transfer: with contact guard assist;ambulating;shower seat;Tub transfer;Shower transfer  OT Frequency: Min 1X/week    Co-evaluation              AM-PAC OT "6 Clicks" Daily Activity     Outcome Measure Help from another person eating meals?: None Help from another person taking care of personal grooming?: A Little Help from another person toileting, which includes using toliet, bedpan, or urinal?: A Little Help from another person bathing (including washing, rinsing, drying)?: A Lot Help from another person to put on and taking off regular upper body clothing?: A Little Help from another person to put on and taking off regular lower body clothing?: A Lot 6 Click Score: 17   End of Session Equipment Utilized During Treatment: Rolling walker (2 wheels);Gait belt Nurse Communication: Mobility status  Activity Tolerance: Patient tolerated treatment well Patient left: in bed;with call bell/phone within reach;with bed alarm set  OT Visit Diagnosis: Unsteadiness on feet (R26.81);Other abnormalities of gait and mobility (R26.89);Muscle weakness (generalized) (M62.81)  Time: 8469-6295 OT Time Calculation (min): 25 min Charges:  OT General Charges $OT Visit: 1 Visit OT Evaluation $OT Eval Moderate Complexity: 1 Mod OT Treatments $Self Care/Home Management : 8-22 mins Carver Fila, OTD, OTR/L SecureChat Preferred Acute Rehab [phone]   Satina Jerrell K Koonce 09/25/2023, 5:00 PM

## 2023-09-25 NOTE — Progress Notes (Signed)
Mobility Specialist Progress Note:   09/25/23 0955  Mobility  Activity Ambulated with assistance in room;Ambulated with assistance to bathroom  Level of Assistance Contact guard assist, steadying assist  Assistive Device None  Distance Ambulated (ft) 20 ft  Activity Response Tolerated well  Mobility Referral Yes  Mobility visit 1 Mobility  Mobility Specialist Start Time (ACUTE ONLY) 0955  Mobility Specialist Stop Time (ACUTE ONLY) 1015  Mobility Specialist Time Calculation (min) (ACUTE ONLY) 20 min   Pt received in BR. CGA needed during ambulation d/t minor unsteadiness. SpO2 89%, recovered with seated rest. Pt sat at sink to wash face and brush teeth. Then transferred to chair with minG assist and no unsteadiness noted. Left with all needs met, alarm on.  Addison Lank Mobility Specialist Please contact via SecureChat or  Rehab office at [phone]

## 2023-09-25 NOTE — Progress Notes (Addendum)
PROGRESS NOTE    [name]  XBJ:[id number] DOB: May 04, 1941 DOA: 09/23/2023 PCP: Kaleen Mask, MD  82/M with chronic combined CHF, EF 25-30%, CAD, paroxysmal A-fib, chronic anemia, CKD 3A, gout, chronic cholecystitis with cholecystostomy tube. He is followed by IR due to acute cholecystitis in October when he was deemed not a surgical candidate.  IR placed cholecystostomy tube.  Patient then underwent successful percutaneous gallstone removal and exchange of biliary drain on 1/14. -Subsequent to this noted more dyspnea on exertion and cough -In the ER mildly tachypneic, creatinine 1.03, WBC 10.3, BNP 456, troponin 25, respiratory virus panel negative for flu COVID and RSV, CXR noted cardiomegaly, moderate right pleural effusion, pulmonary vascular congestion.  Subjective: -Complains of cough and some shortness of breath  Assessment and Plan:  Acute chronic combined systolic diastolic CHF Acute respiratory failure with hypoxia, pleural effusion -Last echo 10/24 noted EF 25-30%, indeterminate diastolic function, moderately reduced RV function.  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 and/or RV failure   Hypertension -Stable, meds as above   Hyperlipidemia - Continue rosuvastatin   P Atrial fibrillation - Continue home carvedilol and Eliquis   CAD -Remote PCI and stenting of LAD in 2001 - Continue home carvedilol, rosuvastatin, Eliquis  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 2/2 Pocket infection)   Anemia > Hemoglobin stable at 12.4 - Trend CBC   Gout - Continue home allopurinol   Diabetes - SSI   CKD 3A > Creatinine stable in the ED - Trend renal function and electrolytes    DVT prophylaxis: Eliquis Code Status: Full  code Family Communication: None present Disposition Plan: Home pending improvement  Consultants:    Procedures:   Antimicrobials:    Objective: Vitals:   09/24/23 1947 09/24/23 2350 09/25/23 0414 09/25/23 0752  BP: 129/71 98/64 116/78 114/62  Pulse: (!) 50 87 84   Resp: 19 20 18 20   Temp: 97.9 F (36.6 C) 98 F (36.7 C) 98 F (36.7 C) 97.9 F (36.6 C)  TempSrc: Oral Oral Oral Oral  SpO2: 96% 93% 95%   Weight:   98.8 kg   Height:        Intake/Output Summary (Last 24 hours) at 09/25/2023 0957 Last data filed at 09/25/2023 0800 Gross per 24 hour  Intake 360 ml  Output 1250 ml  Net -890 ml   Filed Weights   09/23/23 1546 09/24/23 1702 09/25/23 0414  Weight: 100.2 kg 99.9 kg 98.8 kg    Examination:  General exam: Appears calm and comfortable  Respiratory system: Clear to auscultation Cardiovascular system: S1 & S2 heard, RRR.  Abd: nondistended, soft and nontender.Normal bowel sounds heard. Central nervous system: Alert and oriented. No focal neurological deficits. Extremities: no edema Skin: No rashes Psychiatry:  Mood & affect appropriate.     Data Reviewed:   CBC: Recent Labs  Lab 09/21/23 0840 09/23/23 1727 09/25/23 0220  WBC 9.5 10.3 8.3  NEUTROABS 7.2 8.1*  --   HGB 13.6 12.4* 12.1*  HCT 43.2 37.5* 37.6*  MCV 97.7 93.1 94.0  PLT 245 212 250   Basic Metabolic Panel: Recent Labs  Lab 09/21/23 0840 09/23/23 1727 09/24/23 0922 09/24/23 1848 09/25/23 0220  NA 135 133* 137  --  134*  K 3.9 3.8 3.3*  --  3.5  CL 103  102 104  --  101  CO2 24 21* 25  --  25  GLUCOSE 122* 117* 165*  --  119*  BUN 19 25* 22  --  22  CREATININE 0.89 1.03 0.91  --  1.13  CALCIUM 8.8* 8.8* 7.6*  --  8.1*  MG  --   --   --  2.1  --    GFR: Estimated Creatinine Clearance: 58.4 mL/min (by C-G formula based on SCr of 1.13 mg/dL). Liver Function Tests: Recent Labs  Lab 09/21/23 0840 09/25/23 0220  AST 19 44*  ALT 12 27  ALKPHOS 80 111  BILITOT 2.3* 0.9   PROT 7.2 6.0*  ALBUMIN 3.7 2.4*   No results for input(s): "LIPASE", "AMYLASE" in the last 168 hours. No results for input(s): "AMMONIA" in the last 168 hours. Coagulation Profile: Recent Labs  Lab 09/21/23 0840  INR 1.3*   Cardiac Enzymes: No results for input(s): "CKTOTAL", "CKMB", "CKMBINDEX", "TROPONINI" in the last 168 hours. BNP (last 3 results) Recent Labs    12/17/22 1247  PROBNP 1,790*   HbA1C: No results for input(s): "HGBA1C" in the last 72 hours. CBG: Recent Labs  Lab 09/21/23 0812 09/24/23 2107 09/25/23 0608  GLUCAP 108* 214* 107*   Lipid Profile: No results for input(s): "CHOL", "HDL", "LDLCALC", "TRIG", "CHOLHDL", "LDLDIRECT" in the last 72 hours. Thyroid Function Tests: No results for input(s): "TSH", "T4TOTAL", "FREET4", "T3FREE", "THYROIDAB" in the last 72 hours. Anemia Panel: No results for input(s): "VITAMINB12", "FOLATE", "FERRITIN", "TIBC", "IRON", "RETICCTPCT" in the last 72 hours. Urine analysis:    Component Value Date/Time   COLORURINE YELLOW 01/24/2019 1400   APPEARANCEUR CLEAR 01/24/2019 1400   LABSPEC >1.046 (H) 01/24/2019 1400   PHURINE 6.0 01/24/2019 1400   GLUCOSEU NEGATIVE 01/24/2019 1400   HGBUR NEGATIVE 01/24/2019 1400   BILIRUBINUR NEGATIVE 01/24/2019 1400   KETONESUR NEGATIVE 01/24/2019 1400   PROTEINUR NEGATIVE 01/24/2019 1400   NITRITE NEGATIVE 01/24/2019 1400   LEUKOCYTESUR NEGATIVE 01/24/2019 1400   Sepsis Labs: @LABRCNTIP (procalcitonin:4,lacticidven:4)  ) Recent Results (from the past 240 hours)  Resp panel by RT-PCR (RSV, Flu A&B, Covid) Anterior Nasal Swab     Status: None   Collection Time: 09/23/23  5:27 PM   Specimen: Anterior Nasal Swab  Result Value Ref Range Status   SARS Coronavirus 2 by RT PCR NEGATIVE NEGATIVE Final    Comment: (NOTE) SARS-CoV-2 target nucleic acids are NOT DETECTED.  The SARS-CoV-2 RNA is generally detectable in upper respiratory specimens during the acute phase of infection. The  lowest concentration of SARS-CoV-2 viral copies this assay can detect is 138 copies/mL. A negative result does not preclude SARS-Cov-2 infection and should not be used as the sole basis for treatment or other patient management decisions. A negative result may occur with  improper specimen collection/handling, submission of specimen other than nasopharyngeal swab, presence of viral mutation(s) within the areas targeted by this assay, and inadequate number of viral copies(<138 copies/mL). A negative result must be combined with clinical observations, patient history, and epidemiological information. The expected result is Negative.  Fact Sheet for Patients:  BloggerCourse.com  Fact Sheet for Healthcare Providers:  SeriousBroker.it  This test is no t yet approved or cleared by the [place] FDA and  has been authorized for detection and/or diagnosis of SARS-CoV-2 by FDA under an Emergency Use Authorization (EUA). This EUA will remain  in effect (meaning this test can be used) for the duration of the COVID-19 declaration under Section 564(b)(1) of the  Act, 21 U.S.C.section 360bbb-3(b)(1), unless the authorization is terminated  or revoked sooner.       Influenza A by PCR NEGATIVE NEGATIVE Final   Influenza B by PCR NEGATIVE NEGATIVE Final    Comment: (NOTE) The Xpert Xpress SARS-CoV-2/FLU/RSV plus assay is intended as an aid in the diagnosis of influenza from Nasopharyngeal swab specimens and should not be used as a sole basis for treatment. Nasal washings and aspirates are unacceptable for Xpert Xpress SARS-CoV-2/FLU/RSV testing.  Fact Sheet for Patients: BloggerCourse.com  Fact Sheet for Healthcare Providers: SeriousBroker.it  This test is not yet approved or cleared by the [place] FDA and has been authorized for detection and/or diagnosis of SARS-CoV-2 by FDA under  an Emergency Use Authorization (EUA). This EUA will remain in effect (meaning this test can be used) for the duration of the COVID-19 declaration under Section 564(b)(1) of the Act, 21 U.S.C. section 360bbb-3(b)(1), unless the authorization is terminated or revoked.     Resp Syncytial Virus by PCR NEGATIVE NEGATIVE Final    Comment: (NOTE) Fact Sheet for Patients: BloggerCourse.com  Fact Sheet for Healthcare Providers: SeriousBroker.it  This test is not yet approved or cleared by the [place] FDA and has been authorized for detection and/or diagnosis of SARS-CoV-2 by FDA under an Emergency Use Authorization (EUA). This EUA will remain in effect (meaning this test can be used) for the duration of the COVID-19 declaration under Section 564(b)(1) of the Act, 21 U.S.C. section 360bbb-3(b)(1), unless the authorization is terminated or revoked.  Performed at [facility], [address], [place], [place] [address]      Radiology Studies: DG CHEST PORT 1 VIEW Result Date: 09/25/2023 CLINICAL DATA:  Short of breath.  Cough. EXAM: PORTABLE CHEST 1 VIEW COMPARISON:  09/23/2023 and older exams.  CT, 05/24/2023. FINDINGS: Stable enlargement of the cardiac silhouette. Stable left coronary artery stent. Linear and hazy opacities are noted in the right mid lung with more confluent opacity at the right lung base. Left lung is essentially clear. No pneumothorax. IMPRESSION: 1. No significant change from the most recent prior study. 2. Right mid and lower lung opacity consistent with a combination pleural fluid and atelectasis and/or pneumonia. Electronically Signed   By: Amie Portland M.D.   On: 09/25/2023 09:44   DG Chest 2 View Result Date: 09/23/2023 CLINICAL DATA:  Shortness of breath. EXAM: CHEST - 2 VIEW COMPARISON:  Radiograph 06/26/2023 FINDINGS: The heart is enlarged. Coronary stent visualized. Right pleural effusion,  at least moderate in size. Bandlike opacity in the right mid lung may represent fluid in the fissure. Mild vascular congestion. No pneumothorax. Mild thoracic spondylosis. Drainage catheter in the upper abdomen is seen only on the lateral view. IMPRESSION: Cardiomegaly. Moderate pleural effusion with fluid in the right minor fissure. Vascular congestion. Recommend correlation for fluid overload. Electronically Signed   By: Narda Rutherford M.D.   On: 09/23/2023 16:56     Scheduled Meds:  allopurinol  100 mg Oral QHS   apixaban  5 mg Oral BID   carvedilol  3.125 mg Oral BID   furosemide  60 mg Intravenous BID   insulin aspart  0-9 Units Subcutaneous TID WC   latanoprost  1 drop Both Eyes QHS   levothyroxine  125 mcg Oral QAC breakfast   rosuvastatin  5 mg Oral Daily   sodium chloride flush  3 mL Intravenous Q12H   Continuous Infusions:  albumin human       LOS: 1 day  Time spent:    Zannie Cove, MD [facility]   09/25/2023, 9:57 AM

## 2023-09-26 DIAGNOSIS — I5043 Acute on chronic combined systolic (congestive) and diastolic (congestive) heart failure: Secondary | ICD-10-CM | POA: Diagnosis not present

## 2023-09-26 LAB — URINALYSIS, ROUTINE W REFLEX MICROSCOPIC
Bilirubin Urine: NEGATIVE
Glucose, UA: NEGATIVE mg/dL
Hgb urine dipstick: NEGATIVE
Ketones, ur: NEGATIVE mg/dL
Leukocytes,Ua: NEGATIVE
Nitrite: NEGATIVE
Protein, ur: NEGATIVE mg/dL
Specific Gravity, Urine: 1.011 (ref 1.005–1.030)
pH: 5 (ref 5.0–8.0)

## 2023-09-26 LAB — BASIC METABOLIC PANEL
Anion gap: 12 (ref 5–15)
BUN: 22 mg/dL (ref 8–23)
CO2: 24 mmol/L (ref 22–32)
Calcium: 8.5 mg/dL — ABNORMAL LOW (ref 8.9–10.3)
Chloride: 98 mmol/L (ref 98–111)
Creatinine, Ser: 1.24 mg/dL (ref 0.61–1.24)
GFR, Estimated: 58 mL/min — ABNORMAL LOW (ref >60)
Glucose, Bld: 119 mg/dL — ABNORMAL HIGH (ref 70–99)
Potassium: 3.7 mmol/L (ref 3.5–5.1)
Sodium: 134 mmol/L — ABNORMAL LOW (ref 135–145)

## 2023-09-26 LAB — GLUCOSE, CAPILLARY
Glucose-Capillary: 126 mg/dL — ABNORMAL HIGH (ref 70–99)
Glucose-Capillary: 201 mg/dL — ABNORMAL HIGH (ref 70–99)
Glucose-Capillary: 82 mg/dL (ref 70–99)
Glucose-Capillary: 142 mg/dL — ABNORMAL HIGH (ref 70–99)

## 2023-09-26 LAB — AMMONIA: Ammonia: 14 umol/L (ref 9–35)

## 2023-09-26 MED ORDER — SODIUM CHLORIDE 0.9 % IV SOLN
1.0000 g | INTRAVENOUS | Status: DC
  Administered 2023-09-26: 1 g via INTRAVENOUS
  Filled 2023-09-26: qty 10

## 2023-09-26 MED ORDER — LACTULOSE 10 GM/15ML PO SOLN
20.0000 g | Freq: Two times a day (BID) | ORAL | Status: DC
  Administered 2023-09-26 – 2023-09-27 (×3): 20 g via ORAL
  Filled 2023-09-26 (×4): qty 30

## 2023-09-26 MED ORDER — FUROSEMIDE 10 MG/ML IJ SOLN: 80.0000 mg | Freq: Two times a day (BID) | INTRAMUSCULAR | Status: DC

## 2023-09-26 MED ORDER — POTASSIUM CHLORIDE CRYS ER 20 MEQ PO TBCR
40.0000 meq | EXTENDED_RELEASE_TABLET | Freq: Once | ORAL | Status: AC
  Administered 2023-09-26: 40 meq via ORAL
  Filled 2023-09-26: qty 2

## 2023-09-26 MED ORDER — GUAIFENESIN ER 600 MG PO TB12
600.0000 mg | ORAL_TABLET | Freq: Two times a day (BID) | ORAL | Status: DC
  Administered 2023-09-26 – 2023-10-01 (×11): 600 mg via ORAL
  Filled 2023-09-26 (×11): qty 1

## 2023-09-26 MED ORDER — IPRATROPIUM-ALBUTEROL 0.5-2.5 (3) MG/3ML IN SOLN
3.0000 mL | Freq: Four times a day (QID) | RESPIRATORY_TRACT | Status: DC
  Administered 2023-09-26 – 2023-09-30 (×15): 3 mL via RESPIRATORY_TRACT
  Filled 2023-09-26 (×16): qty 3

## 2023-09-26 NOTE — Evaluation (Signed)
Physical Therapy Evaluation Patient Details Name: [name] MRN: [id number] DOB: 1941-07-04 Today's Date: 09/26/2023  History of Present Illness  83 y/o M presenting to ED on 1/16 with SOB, admitted for acute on chronic combined systolic and diastolic CHF. Pt being followed by IR for acute cholecysititis, biliary drain placed 1/14.     PMH includes paroxysmal A fib on Eliquis, CHF, CKD, dyslipidemia   Clinical Impression  Pt presents with condition above and deficits mentioned below, see PT Problem List. PTA, he was mod I utilizing a RW vs cane for functional mobility, living alone in a house with 2 STE. Per OT eval, daughter lives nearby and can check on him. Currently, the pt is very lethargic, impacting his ability to attend to cues and initiate tasks well. He needed more assistance for functional mobility during this PT Eval today than he seemed to need during his OT Eval yesterday, likely related to his increased lethargy this date. He required modA to transition supine to sit EOB and to transfer to stand after multiple failed attempts. He needed CGA-minA to ambulate up to ~80 ft with a RW. The pt demonstrates deficits in gross strength, cognition, arousal, balance, and activity tolerance/endurance. He is currently requiring 2L supplemental O2 to maintain his sats >/= 90% at rest and when ambulating. He would be unsafe to be home alone at this time. Based on his current presentation and report of not having frequent assistance at d/c, he could benefit from short-term inpatient rehab, < 3 hours/day. If the pt could obtain frequent assistance at d/c or makes great functional progress then may be able to progress to going home with [facility]. Will continue to follow acutely.      If plan is discharge home, recommend the following: A lot of help with walking and/or transfers;A lot of help with bathing/dressing/bathroom;Assistance with cooking/housework;Direct supervision/assist for medications  management;Direct supervision/assist for financial management;Assist for transportation;Help with stairs or ramp for entrance   Can travel by private vehicle   Yes    Equipment Recommendations None recommended by PT  Recommendations for Other Services       Functional Status Assessment Patient has had a recent decline in their functional status and demonstrates the ability to make significant improvements in function in a reasonable and predictable amount of time.     Precautions / Restrictions Precautions Precautions: Fall Precaution Comments: biliary drain R side Restrictions Weight Bearing Restrictions Per Provider Order: No      Mobility  Bed Mobility Overal bed mobility: Needs Assistance Bed Mobility: Supine to Sit     Supine to sit: Mod assist, HOB elevated     General bed mobility comments: Pt difficult to arouse and maintain attention long enough to initiate transition supine to sit EOB, needing modA to initiate legs towards EOB. Then pt began to initiate and assist and pull up on therapist to ascend his trunk    Transfers Overall transfer level: Needs assistance Equipment used: Rolling walker (2 wheels) Transfers: Sit to/from Stand Sit to Stand: Mod assist           General transfer comment: Pt lethargic and having difficulty powering up to stand from EOB, needing cues for hand placement and multiple attempts before successful with modA to stand.    Ambulation/Gait Ambulation/Gait assistance: Contact guard assist, Min assist Gait Distance (Feet): 80 Feet Assistive device: Rolling walker (2 wheels) Gait Pattern/deviations: Step-through pattern, Decreased stride length, Trunk flexed Gait velocity: reduced Gait velocity interpretation: <1.31 ft/sec, indicative  of household ambulator   General Gait Details: Pt ambulates slowly with small steps and a flexed posture. CGA for balance with intermittent minA provided to guide RW when turning or when backing up  to chair to sit.  Stairs            Wheelchair Mobility     Tilt Bed    Modified Rankin (Stroke Patients Only)       Balance Overall balance assessment: Needs assistance Sitting-balance support: No upper extremity supported, Feet supported Sitting balance-Leahy Scale: Fair     Standing balance support: Bilateral upper extremity supported, During functional activity, Reliant on assistive device for balance Standing balance-Leahy Scale: Poor Standing balance comment: Reliant on RW                             Pertinent Vitals/Pain Pain Assessment Pain Assessment: Faces Faces Pain Scale: Hurts even more Pain Location: reports he is sore but unable to state where when asked Pain Descriptors / Indicators: Discomfort, Grimacing, Guarding, Moaning Pain Intervention(s): Limited activity within patient's tolerance, Monitored during session, Repositioned    Home Living Family/patient expects to be discharged to:: Private residence Living Arrangements: Alone Available Help at Discharge: Family;Available PRN/intermittently Type of Home: House Home Access: Stairs to enter Entrance Stairs-Rails: None Entrance Stairs-Number of Steps: 2   Home Layout: Able to live on main level with bedroom/bathroom;Laundry or work area in [employer] Equipment: Agricultural consultant (2 wheels);Cane - single point;Shower seat;Grab bars - tub/shower;Hand held shower head;Grab bars - toilet      Prior Function Prior Level of Function : Independent/Modified Independent;Driving             Mobility Comments: uses RW and cane for mobility ADLs Comments: ind with ADL     Extremity/Trunk Assessment   Upper Extremity Assessment Upper Extremity Assessment: Defer to OT evaluation    Lower Extremity Assessment Lower Extremity Assessment: Generalized weakness;RLE deficits/detail;LLE deficits/detail RLE Deficits / Details: MMT scores of 4 to 4+ grossly bil; reports numbness/tingling in  legs, intermittently at baseline; 2+ edema noted bil LLE Deficits / Details: MMT scores of 4 to 4+ grossly bil; reports numbness/tingling in legs, intermittently at baseline; 2+ edema noted bil    Cervical / Trunk Assessment Cervical / Trunk Assessment: Normal  Communication   Communication Communication: Hearing impairment (hearing aids in room)  Cognition Arousal: Lethargic Behavior During Therapy: Flat affect Overall Cognitive Status: No family/caregiver present to determine baseline cognitive functioning                                 General Comments: Pt lethargic and needed frequent cues to stimulate to arouse and maintain attention to cues during session. Slow to process cues and initiate mobility, needing frequent repeated cues. This likely was not pt's baseline considering he lived alone PTA. No family currently present to provide info.        General Comments General comments (skin integrity, edema, etc.): SpO2 dropped to 86% on RA sitting EOB, needing 2L O2 to remain >/= 90% when ambulating and at rest; HR up to 110s    Exercises     Assessment/Plan    PT Assessment Patient needs continued PT services  PT Problem List Decreased strength;Decreased activity tolerance;Decreased balance;Decreased mobility;Decreased cognition;Cardiopulmonary status limiting activity;Impaired sensation       PT Treatment Interventions DME instruction;Gait training;Stair training;Functional mobility training;Therapeutic activities;Therapeutic  exercise;Balance training;Neuromuscular re-education;Patient/family education;Cognitive remediation    PT Goals (Current goals can be found in the Care Plan section)  Acute Rehab PT Goals Patient Stated Goal: did not state PT Goal Formulation: With patient Time For Goal Achievement: 10/10/23 Potential to Achieve Goals: Good    Frequency Min 1X/week     Co-evaluation               AM-PAC PT "6 Clicks" Mobility  Outcome  Measure Help needed turning from your back to your side while in a flat bed without using bedrails?: A Lot Help needed moving from lying on your back to sitting on the side of a flat bed without using bedrails?: A Lot Help needed moving to and from a bed to a chair (including a wheelchair)?: A Lot Help needed standing up from a chair using your arms (e.g., wheelchair or bedside chair)?: A Lot Help needed to walk in hospital room?: A Little Help needed climbing 3-5 steps with a railing? : A Lot 6 Click Score: 13    End of Session Equipment Utilized During Treatment: Gait belt;Oxygen Activity Tolerance: Patient limited by fatigue;Patient limited by lethargy Patient left: in chair;with call bell/phone within reach;with chair alarm set Nurse Communication: Mobility status;Other (comment) (sats) PT Visit Diagnosis: Unsteadiness on feet (R26.81);Other abnormalities of gait and mobility (R26.89);Muscle weakness (generalized) (M62.81);Difficulty in walking, not elsewhere classified (R26.2)    Time: 4034-7425 PT Time Calculation (min) (ACUTE ONLY): 30 min   Charges:   PT Evaluation $PT Eval Moderate Complexity: 1 Mod PT Treatments $Therapeutic Activity: 8-22 mins PT General Charges $$ ACUTE PT VISIT: 1 Visit         Virgil Benedict, PT, DPT Acute Rehabilitation Services  Office: [phone]   Bettina Gavia 09/26/2023, 9:31 AM

## 2023-09-26 NOTE — Progress Notes (Addendum)
PROGRESS NOTE    [name]  ZOX:[id number] DOB: 1941/03/19 DOA: 09/23/2023 PCP: Kaleen Mask, MD  82/M with chronic combined CHF, EF 25-30%, CAD, paroxysmal A-fib, chronic anemia, CKD 3A, gout, chronic cholecystitis with cholecystostomy tube. He is followed by IR due to acute cholecystitis in October when he was deemed not a surgical candidate.  IR placed cholecystostomy tube.  Patient then underwent successful percutaneous gallstone removal and exchange of biliary drain on 1/14. -Subsequent to this noted more dyspnea on exertion and cough -In the ER mildly tachypneic, creatinine 1.03, WBC 10.3, BNP 456, troponin 25, respiratory virus panel negative for flu COVID and RSV, CXR noted cardiomegaly, moderate right pleural effusion, pulmonary vascular congestion.  Subjective: -Continues to have some cough and shortness of breath, oxygen requirements improving, more sleepy today  Assessment and Plan:  Acute chronic combined systolic diastolic CHF Acute respiratory failure with hypoxia, pleural effusion -Last echo 10/24 noted EF 25-30%, indeterminate diastolic function, moderately reduced RV function.  -Complicated by hypoalbuminemia and third spacing -Increased dose of IV Lasix, weight down 10 LB, Aldactone added, continue carvedilol -Repeat x-ray tomorrow -Add Jardiance tomorrow if blood pressure is higher  mild metabolic encephalopathy -Afebrile, no leukocytosis, check ammonia level, -If ammonia is normal and remains lethargic will check ABG  Severe hypoalbuminemia -Could have chronic liver disease from NASH and/or RV failure   Hypertension -Stable, meds as above   Hyperlipidemia - Continue rosuvastatin   P Atrial fibrillation - Continue home carvedilol and Eliquis   CAD -Remote PCI and stenting of LAD in 2001 - Continue home carvedilol, rosuvastatin, Eliquis  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 2/2 Pocket infection)    Gout - Continue home allopurinol    Diabetes - SSI, CBGs are stable   CKD 3A > Creatinine stable in the ED - Trend renal function and electrolytes    DVT prophylaxis: Eliquis Code Status: Full code Family Communication: None present, updated daughter yesterday Disposition Plan: May need rehab  Consultants:    Procedures:   Antimicrobials:    Objective: Vitals:   09/26/23 0607 09/26/23 0643 09/26/23 0644 09/26/23 0746  BP:    (!) 110/55  Pulse:  93 89 86  Resp:    18  Temp:    99.8 F (37.7 C)  TempSrc:    Oral  SpO2:  98% 98% 95%  Weight: 96.1 kg     Height:        Intake/Output Summary (Last 24 hours) at 09/26/2023 1039 Last data filed at 09/26/2023 0100 Gross per 24 hour  Intake 405 ml  Output 360 ml  Net 45 ml   Filed Weights   09/24/23 1702 09/25/23 0414 09/26/23 0607  Weight: 99.9 kg 98.8 kg 96.1 kg    Examination:  General exam: AAO x 2, more somnolent today but easily arousable and answers questions HEENT: Positive JVD CVS: S1-S2, regular rhythm Lungs: Decreased breath sounds at the right  Abd: nondistended, soft and nontender.Normal bowel sounds heard. Extremities: 1's edema Skin: No rashes Psychiatry:  Mood & affect appropriate.     Data Reviewed:   CBC: Recent Labs  Lab 09/21/23 0840 09/23/23 1727 09/25/23 0220  WBC 9.5 10.3 8.3  NEUTROABS 7.2 8.1*  --   HGB 13.6 12.4* 12.1*  HCT 43.2 37.5* 37.6*  MCV 97.7 93.1 94.0  PLT 245 212 250   Basic Metabolic Panel: Recent Labs  Lab 09/21/23 0840 09/23/23 1727 09/24/23 0922 09/24/23 1848 09/25/23 0220 09/26/23 0735  NA 135 133* 137  --  134* 134*  K 3.9 3.8 3.3*  --  3.5 3.7  CL 103 102 104  --  101 98  CO2 24 21* 25  --  25 24  GLUCOSE 122* 117* 165*  --  119* 119*  BUN 19 25* 22  --  22 22  CREATININE 0.89 1.03 0.91  --  1.13 1.24  CALCIUM 8.8* 8.8* 7.6*  --  8.1* 8.5*  MG  --   --   --  2.1  --   --    GFR: Estimated Creatinine Clearance: 52.5 mL/min (by C-G formula based on SCr of 1.24 mg/dL). Liver  Function Tests: Recent Labs  Lab 09/21/23 0840 09/25/23 0220  AST 19 44*  ALT 12 27  ALKPHOS 80 111  BILITOT 2.3* 0.9  PROT 7.2 6.0*  ALBUMIN 3.7 2.4*   No results for input(s): "LIPASE", "AMYLASE" in the last 168 hours. No results for input(s): "AMMONIA" in the last 168 hours. Coagulation Profile: Recent Labs  Lab 09/21/23 0840  INR 1.3*   Cardiac Enzymes: No results for input(s): "CKTOTAL", "CKMB", "CKMBINDEX", "TROPONINI" in the last 168 hours. BNP (last 3 results) Recent Labs    12/17/22 1247  PROBNP 1,790*   HbA1C: No results for input(s): "HGBA1C" in the last 72 hours. CBG: Recent Labs  Lab 09/25/23 0608 09/25/23 1144 09/25/23 1611 09/25/23 2115 09/26/23 0558  GLUCAP 107* 125* 108* 194* 126*   Lipid Profile: No results for input(s): "CHOL", "HDL", "LDLCALC", "TRIG", "CHOLHDL", "LDLDIRECT" in the last 72 hours. Thyroid Function Tests: No results for input(s): "TSH", "T4TOTAL", "FREET4", "T3FREE", "THYROIDAB" in the last 72 hours. Anemia Panel: No results for input(s): "VITAMINB12", "FOLATE", "FERRITIN", "TIBC", "IRON", "RETICCTPCT" in the last 72 hours. Urine analysis:    Component Value Date/Time   COLORURINE YELLOW 01/24/2019 1400   APPEARANCEUR CLEAR 01/24/2019 1400   LABSPEC >1.046 (H) 01/24/2019 1400   PHURINE 6.0 01/24/2019 1400   GLUCOSEU NEGATIVE 01/24/2019 1400   HGBUR NEGATIVE 01/24/2019 1400   BILIRUBINUR NEGATIVE 01/24/2019 1400   KETONESUR NEGATIVE 01/24/2019 1400   PROTEINUR NEGATIVE 01/24/2019 1400   NITRITE NEGATIVE 01/24/2019 1400   LEUKOCYTESUR NEGATIVE 01/24/2019 1400   Sepsis Labs: @LABRCNTIP (procalcitonin:4,lacticidven:4)  ) Recent Results (from the past 240 hours)  Resp panel by RT-PCR (RSV, Flu A&B, Covid) Anterior Nasal Swab     Status: None   Collection Time: 09/23/23  5:27 PM   Specimen: Anterior Nasal Swab  Result Value Ref Range Status   SARS Coronavirus 2 by RT PCR NEGATIVE NEGATIVE Final    Comment:  (NOTE) SARS-CoV-2 target nucleic acids are NOT DETECTED.  The SARS-CoV-2 RNA is generally detectable in upper respiratory specimens during the acute phase of infection. The lowest concentration of SARS-CoV-2 viral copies this assay can detect is 138 copies/mL. A negative result does not preclude SARS-Cov-2 infection and should not be used as the sole basis for treatment or other patient management decisions. A negative result may occur with  improper specimen collection/handling, submission of specimen other than nasopharyngeal swab, presence of viral mutation(s) within the areas targeted by this assay, and inadequate number of viral copies(<138 copies/mL). A negative result must be combined with clinical observations, patient history, and epidemiological information. The expected result is Negative.  Fact Sheet for Patients:  BloggerCourse.com  Fact Sheet for Healthcare Providers:  SeriousBroker.it  This test is no t yet approved or cleared by the [place] FDA and  has been authorized for detection and/or diagnosis of SARS-CoV-2 by FDA under  an Emergency Use Authorization (EUA). This EUA will remain  in effect (meaning this test can be used) for the duration of the COVID-19 declaration under Section 564(b)(1) of the Act, 21 U.S.C.section 360bbb-3(b)(1), unless the authorization is terminated  or revoked sooner.       Influenza A by PCR NEGATIVE NEGATIVE Final   Influenza B by PCR NEGATIVE NEGATIVE Final    Comment: (NOTE) The Xpert Xpress SARS-CoV-2/FLU/RSV plus assay is intended as an aid in the diagnosis of influenza from Nasopharyngeal swab specimens and should not be used as a sole basis for treatment. Nasal washings and aspirates are unacceptable for Xpert Xpress SARS-CoV-2/FLU/RSV testing.  Fact Sheet for Patients: BloggerCourse.com  Fact Sheet for Healthcare  Providers: SeriousBroker.it  This test is not yet approved or cleared by the [place] FDA and has been authorized for detection and/or diagnosis of SARS-CoV-2 by FDA under an Emergency Use Authorization (EUA). This EUA will remain in effect (meaning this test can be used) for the duration of the COVID-19 declaration under Section 564(b)(1) of the Act, 21 U.S.C. section 360bbb-3(b)(1), unless the authorization is terminated or revoked.     Resp Syncytial Virus by PCR NEGATIVE NEGATIVE Final    Comment: (NOTE) Fact Sheet for Patients: BloggerCourse.com  Fact Sheet for Healthcare Providers: SeriousBroker.it  This test is not yet approved or cleared by the [place] FDA and has been authorized for detection and/or diagnosis of SARS-CoV-2 by FDA under an Emergency Use Authorization (EUA). This EUA will remain in effect (meaning this test can be used) for the duration of the COVID-19 declaration under Section 564(b)(1) of the Act, 21 U.S.C. section 360bbb-3(b)(1), unless the authorization is terminated or revoked.  Performed at [facility], [address] [place], [place] [address]      Radiology Studies: DG CHEST PORT 1 VIEW Result Date: 09/25/2023 CLINICAL DATA:  Short of breath.  Cough. EXAM: PORTABLE CHEST 1 VIEW COMPARISON:  09/23/2023 and older exams.  CT, 05/24/2023. FINDINGS: Stable enlargement of the cardiac silhouette. Stable left coronary artery stent. Linear and hazy opacities are noted in the right mid lung with more confluent opacity at the right lung base. Left lung is essentially clear. No pneumothorax. IMPRESSION: 1. No significant change from the most recent prior study. 2. Right mid and lower lung opacity consistent with a combination pleural fluid and atelectasis and/or pneumonia. Electronically Signed   By: Amie Portland M.D.   On: 09/25/2023 09:44      Scheduled Meds:  allopurinol  100 mg Oral QHS   apixaban  5 mg Oral BID   carvedilol  3.125 mg Oral BID   furosemide  60 mg Intravenous BID   guaiFENesin  600 mg Oral BID   insulin aspart  0-9 Units Subcutaneous TID WC   ipratropium-albuterol  3 mL Nebulization QID   lactulose  20 g Oral BID   latanoprost  1 drop Both Eyes QHS   levothyroxine  125 mcg Oral QAC breakfast   potassium chloride  40 mEq Oral Once   rosuvastatin  5 mg Oral Daily   sodium chloride flush  3 mL Intravenous Q12H   spironolactone  25 mg Oral Daily   Continuous Infusions:     LOS: 2 days    Time spent:    Zannie Cove, MD [facility]   09/26/2023, 10:39 AM

## 2023-09-26 NOTE — Progress Notes (Signed)
Mobility Specialist Progress Note:   09/26/23 1107  Mobility  Activity Transferred to/from [facility]  Level of Assistance Minimal assist, patient does 75% or more  Assistive Device BSC;None  Distance Ambulated (ft) 5 ft  Activity Response Tolerated fair (pt lethargic today)  Mobility Referral Yes  Mobility visit 1 Mobility  Mobility Specialist Start Time (ACUTE ONLY) 1100  Mobility Specialist Stop Time (ACUTE ONLY) 1114  Mobility Specialist Time Calculation (min) (ACUTE ONLY) 14 min   Pt received transferring to [facility] with grandson in room and chair alarm going off. Required minA today, with incr time d/t pt seeming lethargic this am. Slow to answer questions, then we noticed his hearing aid was not in. Once on, pt seemed more alert and quicker to answer. Encouraged frequent OOB mobility. Pt c/o constipation, RN aware. Back in bed with all needs met, alarm on.   Addison Lank Mobility Specialist Please contact via SecureChat or  Rehab office at [phone]

## 2023-09-26 NOTE — Significant Event (Addendum)
Rapid Response Event Note   Reason for Call :  Fever, lethargy  Initial Focused Assessment:  Pt lying in bed, awake, lethargic. Lung sounds with crackles throughout. RR 22, accessory muscle use noted. Skin hot to touch, dry, non-tenting. Congested cough, non-productive.   VS: T 102.37F, BP 110/74, HR 89, RR 22, SpO2 95% on 2LNC  Interventions:  -Flutter valve and incentive spirometer -PRN Tylenol given -Pt received 60mg  IV Lasix at 0939 -CXR, cultures ordered by MD  Plan of Care:  -VS per MEWS protocol   Event Summary:  MD Notified: Dr. Jomarie Longs, per primary RN Call Time: 1201 Arrival Time: 1205 End Time: 1235  Jennye Moccasin, RN

## 2023-09-26 NOTE — Progress Notes (Signed)
SATURATION QUALIFICATIONS: (This note is used to comply with regulatory documentation for home oxygen)  Patient Saturations on Room Air at Rest = 86%  Patient Saturations on Room Air while Ambulating = N/A  Patient Saturations on 2 Liters of oxygen while Ambulating = 90%  Please briefly explain why patient needs home oxygen: Pt needs supplemental O2 to maintain sats >/= 90% at rest and when ambulating   Virgil Benedict, PT, DPT Acute Rehabilitation Services  Office: [phone]

## 2023-09-27 ENCOUNTER — Inpatient Hospital Stay (HOSPITAL_COMMUNITY): Payer: Medicare PPO

## 2023-09-27 DIAGNOSIS — I5043 Acute on chronic combined systolic (congestive) and diastolic (congestive) heart failure: Secondary | ICD-10-CM | POA: Diagnosis not present

## 2023-09-27 LAB — RESPIRATORY PANEL BY PCR

## 2023-09-27 LAB — CBC
HCT: 35.8 % — ABNORMAL LOW (ref 39.0–52.0)
Hemoglobin: 11.5 g/dL — ABNORMAL LOW (ref 13.0–17.0)
MCH: 30.4 pg (ref 26.0–34.0)
MCHC: 32.1 g/dL (ref 30.0–36.0)
MCV: 94.7 fL (ref 80.0–100.0)
Platelets: 243 10*3/uL (ref 150–400)
RBC: 3.78 MIL/uL — ABNORMAL LOW (ref 4.22–5.81)
RDW: 14 % (ref 11.5–15.5)
WBC: 4.8 10*3/uL (ref 4.0–10.5)
nRBC: 0 % (ref 0.0–0.2)

## 2023-09-27 LAB — GLUCOSE, CAPILLARY
Glucose-Capillary: 113 mg/dL — ABNORMAL HIGH (ref 70–99)
Glucose-Capillary: 173 mg/dL — ABNORMAL HIGH (ref 70–99)
Glucose-Capillary: 114 mg/dL — ABNORMAL HIGH (ref 70–99)
Glucose-Capillary: 121 mg/dL — ABNORMAL HIGH (ref 70–99)

## 2023-09-27 LAB — BLOOD GAS, ARTERIAL
Acid-Base Excess: 4.2 mmol/L — ABNORMAL HIGH (ref 0.0–2.0)
Bicarbonate: 29.2 mmol/L — ABNORMAL HIGH (ref 20.0–28.0)
Drawn by: 33176
O2 Saturation: 98.6 %
Patient temperature: 36.9
pCO2 arterial: 44 mm[Hg] (ref 32–48)
pH, Arterial: 7.43 (ref 7.35–7.45)
pO2, Arterial: 82 mm[Hg] — ABNORMAL LOW (ref 83–108)

## 2023-09-27 LAB — COMPREHENSIVE METABOLIC PANEL
ALT: 24 U/L (ref 0–44)
AST: 41 U/L (ref 15–41)
Albumin: 2.8 g/dL — ABNORMAL LOW (ref 3.5–5.0)
Alkaline Phosphatase: 94 U/L (ref 38–126)
Anion gap: 9 (ref 5–15)
BUN: 24 mg/dL — ABNORMAL HIGH (ref 8–23)
CO2: 27 mmol/L (ref 22–32)
Calcium: 8.4 mg/dL — ABNORMAL LOW (ref 8.9–10.3)
Chloride: 97 mmol/L — ABNORMAL LOW (ref 98–111)
Creatinine, Ser: 1.23 mg/dL (ref 0.61–1.24)
GFR, Estimated: 58 mL/min — ABNORMAL LOW (ref >60)
Glucose, Bld: 114 mg/dL — ABNORMAL HIGH (ref 70–99)
Potassium: 3.8 mmol/L (ref 3.5–5.1)
Sodium: 133 mmol/L — ABNORMAL LOW (ref 135–145)
Total Bilirubin: 0.8 mg/dL (ref 0.0–1.2)
Total Protein: 6.2 g/dL — ABNORMAL LOW (ref 6.5–8.1)

## 2023-09-27 LAB — PROCALCITONIN: Procalcitonin: 0.2 ng/mL

## 2023-09-27 LAB — C-REACTIVE PROTEIN: CRP: 18.8 mg/dL — ABNORMAL HIGH (ref <1.0)

## 2023-09-27 MED ORDER — IOHEXOL 9 MG/ML PO SOLN
500.0000 mL | ORAL | Status: AC
  Administered 2023-09-27 (×2): 500 mL via ORAL

## 2023-09-27 MED ORDER — CEFEPIME HCL 2 G IV SOLR
2.0000 g | Freq: Two times a day (BID) | INTRAVENOUS | Status: DC
  Administered 2023-09-27 – 2023-09-28 (×3): 2 g via INTRAVENOUS
  Filled 2023-09-27 (×3): qty 12.5

## 2023-09-27 MED ORDER — OSELTAMIVIR PHOSPHATE 30 MG PO CAPS
30.0000 mg | ORAL_CAPSULE | Freq: Two times a day (BID) | ORAL | Status: DC
  Administered 2023-09-27 – 2023-09-28 (×3): 30 mg via ORAL
  Filled 2023-09-27 (×3): qty 1

## 2023-09-27 NOTE — Plan of Care (Signed)
  Problem: Clinical Measurements: Goal: Respiratory complications will improve Outcome: Progressing   Problem: Activity: Goal: Risk for activity intolerance will decrease Outcome: Progressing   Problem: Elimination: Goal: Will not experience complications related to urinary retention Outcome: Progressing   Problem: Pain Managment: Goal: General experience of comfort will improve and/or be controlled Outcome: Progressing   Problem: Safety: Goal: Ability to remain free from injury will improve Outcome: Progressing   Problem: Skin Integrity: Goal: Risk for impaired skin integrity will decrease Outcome: Progressing

## 2023-09-27 NOTE — Progress Notes (Signed)
PROGRESS NOTE    [name]  ZOX:[id number] DOB: 05-20-1941 DOA: 09/23/2023 PCP: Kaleen Mask, MD  82/M with chronic combined CHF, EF 25-30%, CAD, paroxysmal A-fib, chronic anemia, CKD 3A, gout, chronic cholecystitis with cholecystostomy tube. He is followed by IR due to acute cholecystitis in October when he was deemed not a surgical candidate.  IR placed cholecystostomy tube.  Patient then underwent successful percutaneous gallstone removal and exchange of biliary drain on 1/14. -Subsequent to this noted more dyspnea on exertion and cough -In the ER mildly tachypneic, creatinine 1.03, WBC 10.3, BNP 456, troponin 25, respiratory virus panel negative for flu COVID and RSV, CXR noted cardiomegaly, moderate right pleural effusion, pulmonary vascular congestion. -Admitted started on diuretics -1/19, noted to be somnolent and drowsy, subsequently spiked a temp of 102, started ceftriaxone, sent UA  Subjective: -Trouble breathing overnight, coughing, drowsy  Assessment and Plan:  Acute chronic combined systolic diastolic CHF Acute respiratory failure with hypoxia, pleural effusion -Last echo 10/24 noted EF 25-30%, indeterminate diastolic function, moderately reduced RV function.  -Complicated by hypoalbuminemia and third spacing -Diuresed with IV Lasix, Aldactone and carvedilol, weight down 10 LB -Hold further diuretics in the setting of infection  Fever, SIRS, metabolic encephalopathy -Source of infection is unclear, bronchitis/pneumonia -Also check CT abdomen pelvis with chronic cholecystitis and recent percvgallbladder drain exchange -Discontinue ceftriaxone, start IV cefepime -Also check ABG -Changed to progressive, keep n.p.o. until mental status improves  Severe hypoalbuminemia -Could have chronic liver disease from NASH and/or RV failure   Hypertension -Stable, meds as above   Hyperlipidemia - Continue rosuvastatin   P Atrial fibrillation - Continue home  carvedilol and Eliquis   CAD -Remote PCI and stenting of LAD in 2001 - Continue home carvedilol, rosuvastatin, Eliquis  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 2/2 Pocket infection)    Gout - Continue home allopurinol   Diabetes - SSI, CBGs are stable   CKD 3A > Creatinine stable in the ED - Trend renal function and electrolytes    DVT prophylaxis: Eliquis Code Status: Full code Family Communication: No family at bedside, will update daughter Disposition Plan: May need rehab  Consultants:    Procedures:   Antimicrobials:    Objective: Vitals:   09/27/23 0300 09/27/23 0400 09/27/23 0700 09/27/23 0758  BP: (!) 117/59 120/65 (!) 104/57 107/66  Pulse: 90 81 91 82  Resp: (!) 22 20 20    Temp: 98.7 F (37.1 C) 98.7 F (37.1 C) 98.4 F (36.9 C)   TempSrc: Oral Oral Oral   SpO2: 94% 98% 95% 98%  Weight:      Height:        Intake/Output Summary (Last 24 hours) at 09/27/2023 1015 Last data filed at 09/27/2023 0853 Gross per 24 hour  Intake 570 ml  Output 675 ml  Net -105 ml   Filed Weights   09/24/23 1702 09/25/23 0414 09/26/23 0607  Weight: 99.9 kg 98.8 kg 96.1 kg    Examination:  General exam: Somnolent but easily arousable, oriented to self and place, answers questions but then falls back to sleep HEENT: No JVD CVS: S1-S2, irregular rhythm Lungs: Decreased breath sounds at the right Abdomen: Soft, mildly distended, mild right-sided tenderness, bowel sounds present Extremities: Trace edema     Data Reviewed:   CBC: Recent Labs  Lab 09/21/23 0840 09/23/23 1727 09/25/23 0220 09/27/23 0240  WBC 9.5 10.3 8.3 4.8  NEUTROABS 7.2 8.1*  --   --   HGB 13.6 12.4* 12.1* 11.5*  HCT 43.2 37.5* 37.6* 35.8*  MCV 97.7 93.1 94.0 94.7  PLT 245 212 250 243   Basic Metabolic Panel: Recent Labs  Lab 09/23/23 1727 09/24/23 0922 09/24/23 1848 09/25/23 0220 09/26/23 0735 09/27/23 0240  NA 133* 137  --  134* 134* 133*  K 3.8 3.3*  --  3.5 3.7 3.8  CL 102 104   --  101 98 97*  CO2 21* 25  --  25 24 27   GLUCOSE 117* 165*  --  119* 119* 114*  BUN 25* 22  --  22 22 24*  CREATININE 1.03 0.91  --  1.13 1.24 1.23  CALCIUM 8.8* 7.6*  --  8.1* 8.5* 8.4*  MG  --   --  2.1  --   --   --    GFR: Estimated Creatinine Clearance: 52.9 mL/min (by C-G formula based on SCr of 1.23 mg/dL). Liver Function Tests: Recent Labs  Lab 09/21/23 0840 09/25/23 0220 09/27/23 0240  AST 19 44* 41  ALT 12 27 24   ALKPHOS 80 111 94  BILITOT 2.3* 0.9 0.8  PROT 7.2 6.0* 6.2*  ALBUMIN 3.7 2.4* 2.8*   No results for input(s): "LIPASE", "AMYLASE" in the last 168 hours. Recent Labs  Lab 09/26/23 1123  AMMONIA 14   Coagulation Profile: Recent Labs  Lab 09/21/23 0840  INR 1.3*   Cardiac Enzymes: No results for input(s): "CKTOTAL", "CKMB", "CKMBINDEX", "TROPONINI" in the last 168 hours. BNP (last 3 results) Recent Labs    12/17/22 1247  PROBNP 1,790*   HbA1C: No results for input(s): "HGBA1C" in the last 72 hours. CBG: Recent Labs  Lab 09/26/23 0558 09/26/23 1118 09/26/23 1637 09/26/23 2123 09/27/23 0557  GLUCAP 126* 201* 82 142* 113*   Lipid Profile: No results for input(s): "CHOL", "HDL", "LDLCALC", "TRIG", "CHOLHDL", "LDLDIRECT" in the last 72 hours. Thyroid Function Tests: No results for input(s): "TSH", "T4TOTAL", "FREET4", "T3FREE", "THYROIDAB" in the last 72 hours. Anemia Panel: No results for input(s): "VITAMINB12", "FOLATE", "FERRITIN", "TIBC", "IRON", "RETICCTPCT" in the last 72 hours. Urine analysis:    Component Value Date/Time   COLORURINE YELLOW 09/26/2023 1333   APPEARANCEUR CLEAR 09/26/2023 1333   LABSPEC 1.011 09/26/2023 1333   PHURINE 5.0 09/26/2023 1333   GLUCOSEU NEGATIVE 09/26/2023 1333   HGBUR NEGATIVE 09/26/2023 1333   BILIRUBINUR NEGATIVE 09/26/2023 1333   KETONESUR NEGATIVE 09/26/2023 1333   PROTEINUR NEGATIVE 09/26/2023 1333   NITRITE NEGATIVE 09/26/2023 1333   LEUKOCYTESUR NEGATIVE 09/26/2023 1333   Sepsis  Labs: @LABRCNTIP (procalcitonin:4,lacticidven:4)  ) Recent Results (from the past 240 hours)  Resp panel by RT-PCR (RSV, Flu A&B, Covid) Anterior Nasal Swab     Status: None   Collection Time: 09/23/23  5:27 PM   Specimen: Anterior Nasal Swab  Result Value Ref Range Status   SARS Coronavirus 2 by RT PCR NEGATIVE NEGATIVE Final    Comment: (NOTE) SARS-CoV-2 target nucleic acids are NOT DETECTED.  The SARS-CoV-2 RNA is generally detectable in upper respiratory specimens during the acute phase of infection. The lowest concentration of SARS-CoV-2 viral copies this assay can detect is 138 copies/mL. A negative result does not preclude SARS-Cov-2 infection and should not be used as the sole basis for treatment or other patient management decisions. A negative result may occur with  improper specimen collection/handling, submission of specimen other than nasopharyngeal swab, presence of viral mutation(s) within the areas targeted by this assay, and inadequate number of viral copies(<138 copies/mL). A negative result must be combined with clinical observations,  patient history, and epidemiological information. The expected result is Negative.  Fact Sheet for Patients:  BloggerCourse.com  Fact Sheet for Healthcare Providers:  SeriousBroker.it  This test is no t yet approved or cleared by the [place] FDA and  has been authorized for detection and/or diagnosis of SARS-CoV-2 by FDA under an Emergency Use Authorization (EUA). This EUA will remain  in effect (meaning this test can be used) for the duration of the COVID-19 declaration under Section 564(b)(1) of the Act, 21 U.S.C.section 360bbb-3(b)(1), unless the authorization is terminated  or revoked sooner.       Influenza A by PCR NEGATIVE NEGATIVE Final   Influenza B by PCR NEGATIVE NEGATIVE Final    Comment: (NOTE) The Xpert Xpress SARS-CoV-2/FLU/RSV plus assay is intended as  an aid in the diagnosis of influenza from Nasopharyngeal swab specimens and should not be used as a sole basis for treatment. Nasal washings and aspirates are unacceptable for Xpert Xpress SARS-CoV-2/FLU/RSV testing.  Fact Sheet for Patients: BloggerCourse.com  Fact Sheet for Healthcare Providers: SeriousBroker.it  This test is not yet approved or cleared by the [place] FDA and has been authorized for detection and/or diagnosis of SARS-CoV-2 by FDA under an Emergency Use Authorization (EUA). This EUA will remain in effect (meaning this test can be used) for the duration of the COVID-19 declaration under Section 564(b)(1) of the Act, 21 U.S.C. section 360bbb-3(b)(1), unless the authorization is terminated or revoked.     Resp Syncytial Virus by PCR NEGATIVE NEGATIVE Final    Comment: (NOTE) Fact Sheet for Patients: BloggerCourse.com  Fact Sheet for Healthcare Providers: SeriousBroker.it  This test is not yet approved or cleared by the [place] FDA and has been authorized for detection and/or diagnosis of SARS-CoV-2 by FDA under an Emergency Use Authorization (EUA). This EUA will remain in effect (meaning this test can be used) for the duration of the COVID-19 declaration under Section 564(b)(1) of the Act, 21 U.S.C. section 360bbb-3(b)(1), unless the authorization is terminated or revoked.  Performed at [facility], [address], [place], [place] [address]   Urine Culture (for pregnant, neutropenic or urologic patients or patients with an indwelling urinary catheter)     Status: Abnormal (Preliminary result)   Collection Time: 09/26/23 12:02 PM   Specimen: Urine, Clean Catch  Result Value Ref Range Status   Specimen Description URINE, CLEAN CATCH  Final   Special Requests NONE  Final   Culture (A)  Final    10,000 COLONIES/mL KLEBSIELLA  AEROGENES SUSCEPTIBILITIES TO FOLLOW Performed at [facility], [address] [place], [place] [address]    Report Status PENDING  Incomplete     Radiology Studies: DG CHEST PORT 1 VIEW Result Date: 09/27/2023 CLINICAL DATA:  Dyspnea and fever EXAM: PORTABLE CHEST 1 VIEW COMPARISON:  09/25/2023 FINDINGS: Stable cardiomegaly. Aortic atherosclerotic calcification. Coronary stenting. Patchy bilateral airspace opacities are similar to prior no definite pleural effusion. No pneumothorax. IMPRESSION: Patchy bilateral airspace opacities are similar to prior. Electronically Signed   By: Minerva Fester M.D.   On: 09/27/2023 02:09     Scheduled Meds:  allopurinol  100 mg Oral QHS   apixaban  5 mg Oral BID   carvedilol  3.125 mg Oral BID   guaiFENesin  600 mg Oral BID   insulin aspart  0-9 Units Subcutaneous TID WC   ipratropium-albuterol  3 mL Nebulization QID   lactulose  20 g Oral BID   latanoprost  1 drop Both Eyes  QHS   levothyroxine  125 mcg Oral QAC breakfast   rosuvastatin  5 mg Oral Daily   sodium chloride flush  3 mL Intravenous Q12H   Continuous Infusions:  ceFEPime (MAXIPIME) IV        LOS: 3 days    Time spent:    Zannie Cove, MD [facility]   09/27/2023, 10:15 AM

## 2023-09-27 NOTE — Progress Notes (Signed)
Mobility Specialist Progress Note:   09/27/23 1515  Mobility  Activity Transferred from bed to chair  Level of Assistance Minimal assist, patient does 75% or more  Assistive Device Front wheel walker  Distance Ambulated (ft) 5 ft  Activity Response Tolerated well  Mobility Referral Yes  Mobility visit 1 Mobility  Mobility Specialist Start Time (ACUTE ONLY) 1000  Mobility Specialist Stop Time (ACUTE ONLY) 1013  Mobility Specialist Time Calculation (min) (ACUTE ONLY) 13 min   Pt received in bed requesting to get to the chair. Pt needed MinA w/ bed mobility and contact guard for STS. Was able to take a couple steps towards the chair w/o fault. Ambulated on 4L min, VSS, yet pt was very SOB during transfer. Call bell and personal belongings in reach. All needs met. Chair alarm on.   Pre Mobility BP 112/62 During Mobility BP 102/56 Post Mobility BP 109/58  Thompson Grayer Mobility Specialist  Please contact vis Secure Chat or  Rehab Office [phone]

## 2023-09-27 NOTE — Progress Notes (Signed)
Physical Therapy Treatment Patient Details Name: [name] MRN: [id number] DOB: 12-23-1940 Today's Date: 09/27/2023   History of Present Illness 83 y/o M presenting to ED on 1/16 with SOB, admitted for acute on chronic combined systolic and diastolic CHF. Pt being followed by IR for acute cholecysititis, biliary drain placed 1/14.     PMH includes paroxysmal A fib on Eliquis, CHF, CKD, dyslipidemia    PT Comments  Pt admitted with above diagnosis. Pt was able to ambulate with RW increasing distance today however still needing +2 assist for safety with chair follow as well as desaturates on RA.  Continue to feel that pt will need post acute rehab < 3 hours day. Will continue to follow pt acutely.  Pt currently with functional limitations due to the deficits listed below (see PT Problem List). Pt will benefit from acute skilled PT to increase their independence and safety with mobility to allow discharge.       If plan is discharge home, recommend the following: A lot of help with walking and/or transfers;A lot of help with bathing/dressing/bathroom;Assistance with cooking/housework;Direct supervision/assist for medications management;Direct supervision/assist for financial management;Assist for transportation;Help with stairs or ramp for entrance   Can travel by private vehicle     Yes  Equipment Recommendations  None recommended by PT    Recommendations for Other Services       Precautions / Restrictions Precautions Precautions: Fall Precaution Comments: biliary drain R side Restrictions Weight Bearing Restrictions Per Provider Order: No     Mobility  Bed Mobility               General bed mobility comments: In chair on arrival.    Transfers Overall transfer level: Needs assistance Equipment used: Rolling walker (2 wheels) Transfers: Sit to/from Stand Sit to Stand: Min assist, +2 physical assistance           General transfer comment: Pt lethargic and having  difficulty powering up to stand from chair, needing cues for hand placement and rocking  attempts before successful needing +2 minA to stand and steady.    Ambulation/Gait Ambulation/Gait assistance: Min assist, +2 safety/equipment Gait Distance (Feet): 110 Feet Assistive device: Rolling walker (2 wheels) Gait Pattern/deviations: Step-through pattern, Decreased stride length, Trunk flexed Gait velocity: reduced Gait velocity interpretation: <1.31 ft/sec, indicative of household ambulator   General Gait Details: Pt ambulates slowly with small steps and a flexed posture. CGA for balance with intermittent minA provided to guide RW when turning or when backing up to chair to sit.  also cues needed to get close enough to RW.  Chair follow provided which pt needed. Desaturated with activity on RA needing up to 4LO2 with activity and 3-4 LO2 at rest. BP soft but stable and HR 84-104 bpm   Stairs             Wheelchair Mobility     Tilt Bed    Modified Rankin (Stroke Patients Only)       Balance Overall balance assessment: Needs assistance Sitting-balance support: No upper extremity supported, Feet supported Sitting balance-Leahy Scale: Fair     Standing balance support: Bilateral upper extremity supported, During functional activity, Reliant on assistive device for balance Standing balance-Leahy Scale: Poor Standing balance comment: Reliant on RW                            Cognition Arousal: Lethargic Behavior During Therapy: Flat affect Overall Cognitive Status: No  family/caregiver present to determine baseline cognitive functioning                                 General Comments: Pt lethargic and needed frequent cues to stimulate to arouse and maintain attention to cues during session. Slow to process cues and initiate mobility, needing frequent repeated cues.        Exercises General Exercises - Lower Extremity Ankle Circles/Pumps: AROM,  Both, 10 reps, Seated Long Arc Quad: AROM, Both, 10 reps, Seated Hip Flexion/Marching: AROM, Both, 10 reps, Seated    General Comments        Pertinent Vitals/Pain Pain Assessment Pain Assessment: Faces Faces Pain Scale: Hurts even more Pain Location: reports he is sore but unable to state where when asked Pain Descriptors / Indicators: Discomfort, Grimacing, Guarding, Moaning Pain Intervention(s): Limited activity within patient's tolerance, Monitored during session, Repositioned    Home Living                          Prior Function            PT Goals (current goals can now be found in the care plan section) Acute Rehab PT Goals Patient Stated Goal: did not state Progress towards PT goals: Progressing toward goals    Frequency    Min 1X/week      PT Plan      Co-evaluation              AM-PAC PT "6 Clicks" Mobility   Outcome Measure  Help needed turning from your back to your side while in a flat bed without using bedrails?: A Lot Help needed moving from lying on your back to sitting on the side of a flat bed without using bedrails?: A Lot Help needed moving to and from a bed to a chair (including a wheelchair)?: A Lot Help needed standing up from a chair using your arms (e.g., wheelchair or bedside chair)?: Total Help needed to walk in hospital room?: Total Help needed climbing 3-5 steps with a railing? : A Lot 6 Click Score: 10    End of Session Equipment Utilized During Treatment: Gait belt;Oxygen Activity Tolerance: Patient limited by fatigue;Patient limited by lethargy Patient left: in chair;with call bell/phone within reach;with chair alarm set;with family/visitor present (grandson present) Nurse Communication: Mobility status;Other (comment) (sats) PT Visit Diagnosis: Unsteadiness on feet (R26.81);Other abnormalities of gait and mobility (R26.89);Muscle weakness (generalized) (M62.81);Difficulty in walking, not elsewhere classified  (R26.2)     Time: 1047-1110 PT Time Calculation (min) (ACUTE ONLY): 23 min  Charges:    $Gait Training: 8-22 mins $Therapeutic Exercise: 8-22 mins PT General Charges $$ ACUTE PT VISIT: 1 Visit                     [name] M,PT Acute Rehab Services [phone]    [name] 09/27/2023, 1:24 PM

## 2023-09-27 NOTE — Progress Notes (Signed)
Occupational Therapy Treatment Patient Details Name: [name] MRN: [id number] DOB: 1941/01/23 Today's Date: 09/27/2023   History of present illness 83 y/o M presenting to ED on 1/16 with SOB, admitted for acute on chronic combined systolic and diastolic CHF. Pt being followed by IR for acute cholecysititis, biliary drain placed 1/14.     PMH includes paroxysmal A fib on Eliquis, CHF, CKD, dyslipidemia   OT comments  Pt making slow progress towards goals, overall needing CGA- mod A for ADLs, mod A for bed mobility and min A for transfers with RW. Pt able to walk short distance in room, noted 3/4 DOE, but VSS on 4L O2. Discussed use of AE for LB ADLs, pt declines trial, states he is not interested at this time. Pt presenting with impairment listed below, will follow acutely. Patient will benefit from continued inpatient follow up therapy, <3 hours/day to maximize safety/ind with ADL/functional mobility.       If plan is discharge home, recommend the following:  A little help with walking and/or transfers;A little help with bathing/dressing/bathroom;Assistance with cooking/housework;Direct supervision/assist for medications management;Direct supervision/assist for financial management;Assist for transportation;Help with stairs or ramp for entrance   Equipment Recommendations  Other (comment) (defer)    Recommendations for Other Services PT consult    Precautions / Restrictions Precautions Precautions: Fall Precaution Comments: biliary drain R side Restrictions Weight Bearing Restrictions Per Provider Order: No       Mobility Bed Mobility Overal bed mobility: Needs Assistance         Sit to supine: Mod assist   General bed mobility comments: assist to return BLE's into bed    Transfers Overall transfer level: Needs assistance Equipment used: Rolling walker (2 wheels) Transfers: Sit to/from Stand Sit to Stand: Min assist                 Balance Overall balance  assessment: Needs assistance Sitting-balance support: No upper extremity supported, Feet supported Sitting balance-Leahy Scale: Fair Sitting balance - Comments: posterior lean   Standing balance support: Bilateral upper extremity supported, During functional activity, Reliant on assistive device for balance Standing balance-Leahy Scale: Poor Standing balance comment: Reliant on RW                           ADL either performed or assessed with clinical judgement   ADL Overall ADL's : Needs assistance/impaired                     Lower Body Dressing: Moderate assistance Lower Body Dressing Details (indicate cue type and reason): able to doff R sock, needs assist to doff L sock and don bil socks Toilet Transfer: Contact guard assist;Ambulation;Rolling walker (2 wheels)   Toileting- Clothing Manipulation and Hygiene: Moderate assistance Toileting - Clothing Manipulation Details (indicate cue type and reason): using urinal in standing     Functional mobility during ADLs: Contact guard assist;Rolling walker (2 wheels)      Extremity/Trunk Assessment Upper Extremity Assessment Upper Extremity Assessment: Generalized weakness   Lower Extremity Assessment Lower Extremity Assessment: Defer to PT evaluation        Vision   Vision Assessment?: No apparent visual deficits   Perception Perception Perception: Not tested   Praxis Praxis Praxis: Not tested    Cognition Arousal: Lethargic Behavior During Therapy: Flat affect Overall Cognitive Status: No family/caregiver present to determine baseline cognitive functioning  General Comments: Pt lethargic and needed frequent cues to stimulate to arouse and maintain attention to cues during session. Slow to process cues and initiate mobility, needing frequent repeated cues.        Exercises      Shoulder Instructions       General Comments VSS On 4L O2 during  session    Pertinent Vitals/ Pain       Pain Assessment Pain Assessment: Faces Pain Score: 6  Faces Pain Scale: Hurts even more Pain Location: generalized Pain Descriptors / Indicators: Discomfort, Grimacing, Guarding, Moaning Pain Intervention(s): Limited activity within patient's tolerance, Monitored during session, Repositioned  Home Living                                          Prior Functioning/Environment              Frequency  Min 1X/week        Progress Toward Goals  OT Goals(current goals can now be found in the care plan section)  Progress towards OT goals: Progressing toward goals  Acute Rehab OT Goals Patient Stated Goal: none stated OT Goal Formulation: With patient Time For Goal Achievement: 10/09/23 Potential to Achieve Goals: Good ADL Goals Pt Will Perform Upper Body Dressing: with contact guard assist;sitting Pt Will Perform Lower Body Dressing: with contact guard assist;sit to/from stand;sitting/lateral leans Pt Will Transfer to Toilet: with contact guard assist;ambulating;regular height toilet Pt Will Perform Tub/Shower Transfer: with contact guard assist;ambulating;shower seat;Tub transfer;Shower transfer  Plan      Co-evaluation                 AM-PAC OT "6 Clicks" Daily Activity     Outcome Measure   Help from another person eating meals?: None Help from another person taking care of personal grooming?: A Little Help from another person toileting, which includes using toliet, bedpan, or urinal?: A Lot Help from another person bathing (including washing, rinsing, drying)?: A Lot Help from another person to put on and taking off regular upper body clothing?: A Little Help from another person to put on and taking off regular lower body clothing?: A Lot 6 Click Score: 16    End of Session Equipment Utilized During Treatment: Gait belt;Rolling walker (2 wheels)  OT Visit Diagnosis: Unsteadiness on feet  (R26.81);Other abnormalities of gait and mobility (R26.89);Muscle weakness (generalized) (M62.81)   Activity Tolerance Patient tolerated treatment well   Patient Left in bed;with call bell/phone within reach;with bed alarm set   Nurse Communication Mobility status        Time: 1610-9604 OT Time Calculation (min): 27 min  Charges: OT General Charges $OT Visit: 1 Visit OT Treatments $Self Care/Home Management : 8-22 mins $Therapeutic Activity: 8-22 mins  Carver Fila, OTD, OTR/L SecureChat Preferred Acute Rehab [phone]   Greysin Medlen K Koonce 09/27/2023, 2:15 PM

## 2023-09-27 NOTE — TOC Initial Note (Signed)
Transition of Care [facility]) - Initial/Assessment Note    Patient Details  Name: [name] MRN: [id number] Date of Birth: Jan 29, 1941  [facility]) CM/SW Contact:    Michaela Corner, LCSWA Phone Number: 09/27/2023, 4:32 PM  Clinical Narrative:   CSW went to pts room to discuss PT recs for SNF - patient care was in room at this time. So CSW spoke with pts dtr, [name], about PT recs for SNF. [name] stated she does not think her father will want to go to SNF but that "he needs to". Heather expressed wanting to look at [facility] for SNF.              Expected Discharge Plan: Skilled Nursing Facility Barriers to Discharge: Continued Medical Work up, English as a second language teacher, SNF Pending bed offer   Patient Goals and CMS Choice Patient states their goals for this hospitalization and ongoing recovery are:: Unable to assess          Expected Discharge Plan and Services In-house Referral: Clinical Social Work     Living arrangements for the past 2 months: Skilled Nursing Facility Expected Discharge Date: 09/27/23                                    Prior Living Arrangements/Services Living arrangements for the past 2 months: Skilled Nursing Facility Lives with:: Self          Need for Family Participation in Patient Care: No (Comment) Care giver support system in place?: No (comment)   Criminal Activity/Legal Involvement Pertinent to Current Situation/Hospitalization: No - Comment as needed  Activities of Daily Living   ADL Screening (condition at time of admission) Independently performs ADLs?: Yes (appropriate for developmental age) Is the patient deaf or have difficulty hearing?: Yes Does the patient have difficulty seeing, even when wearing glasses/contacts?: No Does the patient have difficulty concentrating, remembering, or making decisions?: No  Permission Sought/Granted                  Emotional Assessment Appearance:: Appears stated  age Attitude/Demeanor/Rapport: Unable to Assess Affect (typically observed): Unable to Assess Orientation: : Oriented to Self, Oriented to Place Alcohol / Substance Use: Not Applicable Psych Involvement: No (comment)  Admission diagnosis:  Acute exacerbation of CHF (congestive heart failure) (HCC) [I50.9] Acute on chronic congestive heart failure, unspecified heart failure type [facility]) [I50.9] Patient Active Problem List   Diagnosis Date Noted   Acute respiratory failure with hypoxia (HCC) 09/24/2023   Acute on chronic combined systolic and diastolic CHF (congestive heart failure) (HCC) 09/23/2023   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 09/07/2023   Chronic rhinitis 09/07/2023   Deviated nasal septum 09/07/2023   Hypertrophy of nasal turbinates 09/07/2023   HFrEF (heart failure with reduced ejection fraction) (HCC) 09/02/2023   Stage 3a chronic kidney disease (HCC) 08/17/2023   Incontinence of feces 07/08/2023   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   Melena 06/30/2023   Preop cardiovascular exam 06/29/2023   Acute cholecystitis 06/26/2023   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   Controlled type 2 diabetes mellitus without complication (HCC) 10/14/2022   Primary osteoarthritis of left knee 10/06/2021   Ischemic cardiomyopathy 01/19/2013   Biventricular implantable cardioverter-defibrillator in situ 01/28/2012   Anemia 11/06/2011   Chronic combined systolic and diastolic heart failure (HCC)    Coronary artery disease involving coronary bypass graft of native heart without angina pectoris  Dyslipidemia    Gout    Long term (current) use of anticoagulants 12/18/2010   Atrial fibrillation (HCC) 02/25/2010   Essential hypertension, benign 01/14/2009   PCP:  Kaleen Mask, MD Pharmacy:   [facility] - [place], [place] - [address] [place] [address] Phone:  [phone] Fax: [phone]  [facility] - [place], [place] - [address] [place] [address] Phone: [phone] Fax: [phone]  [name] [address] [place] [address] Phone: [phone] Fax: [phone]     Social Drivers of Health (SDOH) Social History: SDOH Screenings   Food Insecurity: No Food Insecurity (09/24/2023)  Housing: Low Risk  (09/24/2023)  Transportation Needs: No Transportation Needs (09/24/2023)  Utilities: Not At Risk (09/24/2023)  Depression (PHQ2-9): Low Risk  (10/14/2022)  Social Connections: Unknown (09/24/2023)  Tobacco Use: Low Risk  (09/21/2023)   SDOH Interventions:     Readmission Risk Interventions     No data to display

## 2023-09-27 NOTE — Progress Notes (Signed)
Pharmacy Antibiotic Note  [name] is a 83 y.o. male admitted on 09/23/2023 with sepsis.  Pharmacy has been consulted for cefepime dosing.  Plan: Cefepime 2 grams iv q12h (crcl 53)  Height: 5\' 10"  (177.8 cm) Weight: 96.1 kg (211 lb 14.4 oz) IBW/kg (Calculated) : 73  Temp (24hrs), Avg:99.3 F (37.4 C), Min:98.4 F (36.9 C), Max:102.2 F (39 C)  Recent Labs  Lab 09/21/23 0840 09/23/23 1727 09/24/23 0922 09/25/23 0220 09/26/23 0735 09/27/23 0240  WBC 9.5 10.3  --  8.3  --  4.8  CREATININE 0.89 1.03 0.91 1.13 1.24 1.23    Estimated Creatinine Clearance: 52.9 mL/min (by C-G formula based on SCr of 1.23 mg/dL).    Allergies  Allergen Reactions   Lipitor [Atorvastatin] Other (See Comments)    MUSCLE ACHE    Jardiance [Empagliflozin] Other (See Comments)    Intolerance      Thank you for allowing pharmacy to be a part of this patient's care.  Greta Doom BS, PharmD, BCPS Clinical Pharmacist 09/27/2023 9:38 AM  Contact: [phone] after 3 PM  "Be curious, not judgmental..." -Debbora Dus

## 2023-09-27 NOTE — Progress Notes (Signed)
Family (dghtr) called for update in care- reported current concerns, follow up from the days RR  and listened to feedback- Have texted DR Loney Loh regarding VS and [place]- described same to family. Family expressed thanks and concern for Father -

## 2023-09-27 NOTE — Progress Notes (Signed)
SATURATION QUALIFICATIONS: (This note is used to comply with regulatory documentation for home oxygen)  Patient Saturations on Room Air at Rest = 87%  Patient Saturations on Room Air while Ambulating = NT as pt desat on RA at rest  Patient Saturations on 4 Liters of oxygen while Ambulating = 90%  Please briefly explain why patient needs home oxygen:Pt desaturating on RA at rest and needed 4LO2 with activity to keep sats >90%.  [name] M,PT Acute Rehab Services [phone]

## 2023-09-28 ENCOUNTER — Inpatient Hospital Stay (HOSPITAL_COMMUNITY): Payer: Medicare PPO

## 2023-09-28 DIAGNOSIS — I5043 Acute on chronic combined systolic (congestive) and diastolic (congestive) heart failure: Secondary | ICD-10-CM | POA: Diagnosis not present

## 2023-09-28 HISTORY — PX: IR THORACENTESIS ASP PLEURAL SPACE W/IMG GUIDE: IMG5380

## 2023-09-28 LAB — BODY FLUID CELL COUNT WITH DIFFERENTIAL
Eos, Fluid: 1 %
Lymphs, Fluid: 73 %
Monocyte-Macrophage-Serous Fluid: 12 % — ABNORMAL LOW (ref 50–90)
Neutrophil Count, Fluid: 13 % (ref 0–25)
Other Cells, Fluid: 1 %
Total Nucleated Cell Count, Fluid: 575 uL (ref 0–1000)

## 2023-09-28 LAB — GLUCOSE, CAPILLARY
Glucose-Capillary: 107 mg/dL — ABNORMAL HIGH (ref 70–99)
Glucose-Capillary: 141 mg/dL — ABNORMAL HIGH (ref 70–99)
Glucose-Capillary: 119 mg/dL — ABNORMAL HIGH (ref 70–99)
Glucose-Capillary: 129 mg/dL — ABNORMAL HIGH (ref 70–99)

## 2023-09-28 LAB — COMPREHENSIVE METABOLIC PANEL
ALT: 22 U/L (ref 0–44)
AST: 40 U/L (ref 15–41)
Albumin: 2.7 g/dL — ABNORMAL LOW (ref 3.5–5.0)
Alkaline Phosphatase: 93 U/L (ref 38–126)
Anion gap: 7 (ref 5–15)
BUN: 19 mg/dL (ref 8–23)
CO2: 26 mmol/L (ref 22–32)
Calcium: 8.2 mg/dL — ABNORMAL LOW (ref 8.9–10.3)
Chloride: 95 mmol/L — ABNORMAL LOW (ref 98–111)
Creatinine, Ser: 0.96 mg/dL (ref 0.61–1.24)
GFR, Estimated: >60 mL/min (ref >60)
Glucose, Bld: 111 mg/dL — ABNORMAL HIGH (ref 70–99)
Potassium: 4.1 mmol/L (ref 3.5–5.1)
Sodium: 128 mmol/L — ABNORMAL LOW (ref 135–145)
Total Bilirubin: 0.7 mg/dL (ref 0.0–1.2)
Total Protein: 6.2 g/dL — ABNORMAL LOW (ref 6.5–8.1)

## 2023-09-28 LAB — CBC
HCT: 36.2 % — ABNORMAL LOW (ref 39.0–52.0)
Hemoglobin: 11.4 g/dL — ABNORMAL LOW (ref 13.0–17.0)
MCH: 29.7 pg (ref 26.0–34.0)
MCHC: 31.5 g/dL (ref 30.0–36.0)
MCV: 94.3 fL (ref 80.0–100.0)
Platelets: 236 10*3/uL (ref 150–400)
RBC: 3.84 MIL/uL — ABNORMAL LOW (ref 4.22–5.81)
RDW: 13.9 % (ref 11.5–15.5)
WBC: 4.2 10*3/uL (ref 4.0–10.5)
nRBC: 0 % (ref 0.0–0.2)

## 2023-09-28 LAB — LACTATE DEHYDROGENASE, PLEURAL OR PERITONEAL FLUID: LD, Fluid: 409 U/L — ABNORMAL HIGH (ref 3–23)

## 2023-09-28 LAB — PROTEIN, PLEURAL OR PERITONEAL FLUID: Total protein, fluid: 3.1 g/dL

## 2023-09-28 LAB — PROCALCITONIN: Procalcitonin: 0.13 ng/mL

## 2023-09-28 LAB — C-REACTIVE PROTEIN: CRP: 16.3 mg/dL — ABNORMAL HIGH (ref <1.0)

## 2023-09-28 MED ORDER — LIDOCAINE HCL 1 % IJ SOLN
INTRAMUSCULAR | Status: AC
  Filled 2023-09-28: qty 20

## 2023-09-28 MED ORDER — SODIUM CHLORIDE 0.9 % IV SOLN
2.0000 g | Freq: Three times a day (TID) | INTRAVENOUS | Status: DC
  Administered 2023-09-28 – 2023-09-29 (×3): 2 g via INTRAVENOUS
  Filled 2023-09-28 (×4): qty 12.5

## 2023-09-28 MED ORDER — LIDOCAINE HCL 1 % IJ SOLN
20.0000 mL | Freq: Once | INTRAMUSCULAR | Status: AC
  Administered 2023-09-28: 10 mL via INTRADERMAL

## 2023-09-28 MED ORDER — OSELTAMIVIR PHOSPHATE 75 MG PO CAPS
75.0000 mg | ORAL_CAPSULE | Freq: Two times a day (BID) | ORAL | Status: DC
  Administered 2023-09-28 – 2023-10-01 (×6): 75 mg via ORAL
  Filled 2023-09-28 (×7): qty 1

## 2023-09-28 MED ORDER — SPIRONOLACTONE 25 MG PO TABS
25.0000 mg | ORAL_TABLET | Freq: Every day | ORAL | Status: DC
  Administered 2023-09-28 – 2023-10-01 (×4): 25 mg via ORAL
  Filled 2023-09-28 (×4): qty 1

## 2023-09-28 MED ORDER — OSELTAMIVIR PHOSPHATE 30 MG PO CAPS
30.0000 mg | ORAL_CAPSULE | Freq: Once | ORAL | Status: AC
Start: 2023-09-28 — End: 2023-09-28
  Administered 2023-09-28: 30 mg via ORAL
  Filled 2023-09-28: qty 1

## 2023-09-28 MED ORDER — TORSEMIDE 20 MG PO TABS
20.0000 mg | ORAL_TABLET | Freq: Every day | ORAL | Status: DC
  Administered 2023-09-28 – 2023-10-01 (×4): 20 mg via ORAL
  Filled 2023-09-28 (×4): qty 1

## 2023-09-28 NOTE — TOC Progression Note (Addendum)
Transition of Care [facility]) - Progression Note    Patient Details  Name: [name] MRN: [id number] Date of Birth: November 10, 1940  [facility]) CM/SW Contact  Michaela Corner, [place] Phone Number: 09/28/2023, 1:03 PM  Clinical Narrative:   CSW introduced self/role and spoke with pt and dtr, [name], at bedside about PT recs for SNF.  Pt is agreeable to SNF, per dtr [facility] is their "only option."   Heather asked CSW about pt going to CIR at this time. CSW reached out to PT about dtrs inquiry.   3:10PM: Brandi w/ [facility] contacted CSW to inform that pt has been active with them for PT and nursing PTA.   TOC will continue to follow.    Expected Discharge Plan: Skilled Nursing Facility Barriers to Discharge: Continued Medical Work up, English as a second language teacher, SNF Pending bed offer  Expected Discharge Plan and Services In-house Referral: Clinical Social Work     Living arrangements for the past 2 months: Skilled Nursing Facility Expected Discharge Date: 09/27/23                                     Social Determinants of Health (SDOH) Interventions SDOH Screenings   Food Insecurity: No Food Insecurity (09/24/2023)  Housing: Low Risk  (09/24/2023)  Transportation Needs: No Transportation Needs (09/24/2023)  Utilities: Not At Risk (09/24/2023)  Depression (PHQ2-9): Low Risk  (10/14/2022)  Social Connections: Unknown (09/24/2023)  Tobacco Use: Low Risk  (09/21/2023)    Readmission Risk Interventions     No data to display

## 2023-09-28 NOTE — Progress Notes (Signed)
Pharmacy Antibiotic Note  [name] is a 83 y.o. male admitted on 09/23/2023 with sepsis.  Pharmacy has been consulted for cefepime dosing.  Estimated CrCl has increased today to ~68 mL/ min. Patient Flu positive.   Plan: Increase Cefepime 2 grams IV to q8h  Monitor clinical progress, cultures/sensitivities, renal function, abx plan F/u ability to d/c cefepime  Height: 5\' 10"  (177.8 cm) Weight: 98.3 kg (216 lb 11.4 oz) IBW/kg (Calculated) : 73  Temp (24hrs), Avg:98.7 F (37.1 C), Min:97.7 F (36.5 C), Max:99.4 F (37.4 C)  Recent Labs  Lab 09/23/23 1727 09/24/23 0922 09/25/23 0220 09/26/23 0735 09/27/23 0240 09/28/23 0243  WBC 10.3  --  8.3  --  4.8 4.2  CREATININE 1.03 0.91 1.13 1.24 1.23 0.96    Estimated Creatinine Clearance: 68.5 mL/min (by C-G formula based on SCr of 0.96 mg/dL).    Allergies  Allergen Reactions   Lipitor [Atorvastatin] Other (See Comments)    MUSCLE ACHE    Jardiance [Empagliflozin] Other (See Comments)    Intolerance      Thank you for allowing pharmacy to be a part of this patient's care.   Signe Colt, PharmD 09/28/2023 10:03 AM  **Pharmacist phone directory can be found on [email or web address] listed under [facility]**

## 2023-09-28 NOTE — Progress Notes (Addendum)
PROGRESS NOTE    [name]  UVO:[id number] DOB: [birth year] DOA: 09/23/2023 PCP: Kaleen Mask, MD  82/M with chronic combined CHF, EF 25-30%, CAD, paroxysmal A-fib, chronic anemia, CKD 3A, gout, chronic cholecystitis with cholecystostomy tube. He is followed by IR due to acute cholecystitis in October when he was deemed not a surgical candidate.  IR placed cholecystostomy tube.  Patient then underwent successful percutaneous gallstone removal and exchange of biliary drain on 1/14. -Subsequent to this noted more dyspnea on exertion and cough -In the ER mildly tachypneic, creatinine 1.03, WBC 10.3, BNP 456, troponin 25, respiratory virus panel negative for flu COVID and RSV, CXR noted cardiomegaly, moderate right pleural effusion, pulmonary vascular congestion. -Admitted started on diuretics -1/19, noted to be somnolent and drowsy, subsequently spiked a temp of 102,  -1/20, lethargic, positive for flu started on Tamiflu  Subjective: -Improving, still with some congestion and shortness of breath, mental status improving, denies abdominal pain  Assessment and Plan:  Acute chronic combined systolic diastolic CHF Acute respiratory failure with hypoxia, right pleural effusion -Last echo 10/24 noted EF 25-30%, indeterminate diastolic function, moderately reduced RV function.  -Complicated by hypoalbuminemia and third spacing -Diuresed on IV Lasix, Aldactone and Coreg, weight down 10 LB  -Imaging yesterday with at least moderate persistent right effusion, will order thoracentesis -Switch to oral torsemide, start Aldactone,  -Jardiance listed as allergies/intolerance, patient could not specify  Fever, SIRS, metabolic encephalopathy Influenza A -Likely secondary to influenza A -CT with moderate right pleural effusion, will obtain thoracentesis today -continue IV cefepime pending evaluation of pleural fluid  -Blood cultures from yesterday are negative   Severe hypoalbuminemia -Could  have chronic liver disease from NASH and/or RV failure -Diuretics as noted above   Hypertension -Stable, meds as above   Hyperlipidemia - Continue rosuvastatin   P Atrial fibrillation - Continue carvedilol, hold eliquis for Thoracentesis   CAD -Remote PCI and stenting of LAD in 2001 - Continue carvedilol, rosuvastatin, Eliquis  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 2/2 Pocket infection)    Gout - Continue allopurinol   Diabetes - SSI, CBGs are stable   CKD 3A -Stable  DVT prophylaxis: resume Eliquis tomorrow after Thoracentesis Code Status: Full code Family Communication: No family at bedside, updated daughter or 1/20 Disposition Plan: May need rehab  Consultants:    Procedures:   Antimicrobials:    Objective: Vitals:   09/28/23 0400 09/28/23 0500 09/28/23 0723 09/28/23 0730  BP: 131/64   119/68  Pulse: 85   80  Resp: (!) 22   20  Temp: 99.2 F (37.3 C)   97.7 F (36.5 C)  TempSrc: Oral   Oral  SpO2: 95%  93% 95%  Weight:  98.3 kg    Height:        Intake/Output Summary (Last 24 hours) at 09/28/2023 1005 Last data filed at 09/28/2023 0828 Gross per 24 hour  Intake 928 ml  Output 1075 ml  Net -147 ml   Filed Weights   09/26/23 0607 09/27/23 0718 09/28/23 0500  Weight: 96.1 kg 101 kg 98.3 kg    Examination:  General exam: Sitting up in recliner, much more awake and alert, hard of hearing, oriented X3 HEENT: Positive JVD CVS: S1-S2, irregular rhythm Lungs: Decreased breath sounds in the right  Abdomen: Soft, mildly distended, mild right-sided tenderness, bowel sounds present Extremities: Trace edema     Data Reviewed:   CBC: Recent Labs  Lab 09/23/23 1727 09/25/23 0220 09/27/23 0240 09/28/23 0243  WBC 10.3 8.3 4.8 4.2  NEUTROABS 8.1*  --   --   --   HGB 12.4* 12.1* 11.5* 11.4*  HCT 37.5* 37.6* 35.8* 36.2*  MCV 93.1 94.0 94.7 94.3  PLT 212 250 243 236   Basic Metabolic Panel: Recent Labs  Lab 09/24/23 0922 09/24/23 1848  09/25/23 0220 09/26/23 0735 09/27/23 0240 09/28/23 0243  NA 137  --  134* 134* 133* 128*  K 3.3*  --  3.5 3.7 3.8 4.1  CL 104  --  101 98 97* 95*  CO2 25  --  25 24 27 26   GLUCOSE 165*  --  119* 119* 114* 111*  BUN 22  --  22 22 24* 19  CREATININE 0.91  --  1.13 1.24 1.23 0.96  CALCIUM 7.6*  --  8.1* 8.5* 8.4* 8.2*  MG  --  2.1  --   --   --   --    GFR: Estimated Creatinine Clearance: 68.5 mL/min (by C-G formula based on SCr of 0.96 mg/dL). Liver Function Tests: Recent Labs  Lab 09/25/23 0220 09/27/23 0240 09/28/23 0243  AST 44* 41 40  ALT 27 24 22   ALKPHOS 111 94 93  BILITOT 0.9 0.8 0.7  PROT 6.0* 6.2* 6.2*  ALBUMIN 2.4* 2.8* 2.7*   No results for input(s): "LIPASE", "AMYLASE" in the last 168 hours. Recent Labs  Lab 09/26/23 1123  AMMONIA 14   Coagulation Profile: No results for input(s): "INR", "PROTIME" in the last 168 hours.  Cardiac Enzymes: No results for input(s): "CKTOTAL", "CKMB", "CKMBINDEX", "TROPONINI" in the last 168 hours. BNP (last 3 results) Recent Labs    12/17/22 1247  PROBNP 1,790*   HbA1C: No results for input(s): "HGBA1C" in the last 72 hours. CBG: Recent Labs  Lab 09/27/23 0557 09/27/23 1117 09/27/23 1542 09/27/23 2118 09/28/23 0548  GLUCAP 113* 173* 114* 121* 107*   Lipid Profile: No results for input(s): "CHOL", "HDL", "LDLCALC", "TRIG", "CHOLHDL", "LDLDIRECT" in the last 72 hours. Thyroid Function Tests: No results for input(s): "TSH", "T4TOTAL", "FREET4", "T3FREE", "THYROIDAB" in the last 72 hours. Anemia Panel: No results for input(s): "VITAMINB12", "FOLATE", "FERRITIN", "TIBC", "IRON", "RETICCTPCT" in the last 72 hours. Urine analysis:    Component Value Date/Time   COLORURINE YELLOW 09/26/2023 1333   APPEARANCEUR CLEAR 09/26/2023 1333   LABSPEC 1.011 09/26/2023 1333   PHURINE 5.0 09/26/2023 1333   GLUCOSEU NEGATIVE 09/26/2023 1333   HGBUR NEGATIVE 09/26/2023 1333   BILIRUBINUR NEGATIVE 09/26/2023 1333   KETONESUR  NEGATIVE 09/26/2023 1333   PROTEINUR NEGATIVE 09/26/2023 1333   NITRITE NEGATIVE 09/26/2023 1333   LEUKOCYTESUR NEGATIVE 09/26/2023 1333   Sepsis Labs: @LABRCNTIP (procalcitonin:4,lacticidven:4)  ) Recent Results (from the past 240 hours)  Resp panel by RT-PCR (RSV, Flu A&B, Covid) Anterior Nasal Swab     Status: None   Collection Time: 09/23/23  5:27 PM   Specimen: Anterior Nasal Swab  Result Value Ref Range Status   SARS Coronavirus 2 by RT PCR NEGATIVE NEGATIVE Final    Comment: (NOTE) SARS-CoV-2 target nucleic acids are NOT DETECTED.  The SARS-CoV-2 RNA is generally detectable in upper respiratory specimens during the acute phase of infection. The lowest concentration of SARS-CoV-2 viral copies this assay can detect is 138 copies/mL. A negative result does not preclude SARS-Cov-2 infection and should not be used as the sole basis for treatment or other patient management decisions. A negative result may occur with  improper specimen collection/handling, submission of specimen other than nasopharyngeal swab, presence of  viral mutation(s) within the areas targeted by this assay, and inadequate number of viral copies(<138 copies/mL). A negative result must be combined with clinical observations, patient history, and epidemiological information. The expected result is Negative.  Fact Sheet for Patients:  BloggerCourse.com  Fact Sheet for Healthcare Providers:  SeriousBroker.it  This test is no t yet approved or cleared by the [place] FDA and  has been authorized for detection and/or diagnosis of SARS-CoV-2 by FDA under an Emergency Use Authorization (EUA). This EUA will remain  in effect (meaning this test can be used) for the duration of the COVID-19 declaration under Section 564(b)(1) of the Act, 21 U.S.C.section 360bbb-3(b)(1), unless the authorization is terminated  or revoked sooner.       Influenza A by PCR  NEGATIVE NEGATIVE Final   Influenza B by PCR NEGATIVE NEGATIVE Final    Comment: (NOTE) The Xpert Xpress SARS-CoV-2/FLU/RSV plus assay is intended as an aid in the diagnosis of influenza from Nasopharyngeal swab specimens and should not be used as a sole basis for treatment. Nasal washings and aspirates are unacceptable for Xpert Xpress SARS-CoV-2/FLU/RSV testing.  Fact Sheet for Patients: BloggerCourse.com  Fact Sheet for Healthcare Providers: SeriousBroker.it  This test is not yet approved or cleared by the [place] FDA and has been authorized for detection and/or diagnosis of SARS-CoV-2 by FDA under an Emergency Use Authorization (EUA). This EUA will remain in effect (meaning this test can be used) for the duration of the COVID-19 declaration under Section 564(b)(1) of the Act, 21 U.S.C. section 360bbb-3(b)(1), unless the authorization is terminated or revoked.     Resp Syncytial Virus by PCR NEGATIVE NEGATIVE Final    Comment: (NOTE) Fact Sheet for Patients: BloggerCourse.com  Fact Sheet for Healthcare Providers: SeriousBroker.it  This test is not yet approved or cleared by the [place] FDA and has been authorized for detection and/or diagnosis of SARS-CoV-2 by FDA under an Emergency Use Authorization (EUA). This EUA will remain in effect (meaning this test can be used) for the duration of the COVID-19 declaration under Section 564(b)(1) of the Act, 21 U.S.C. section 360bbb-3(b)(1), unless the authorization is terminated or revoked.  Performed at [facility], [address], [place], [place] [address]   Urine Culture (for pregnant, neutropenic or urologic patients or patients with an indwelling urinary catheter)     Status: Abnormal (Preliminary result)   Collection Time: 09/26/23 12:02 PM   Specimen: Urine, Clean Catch  Result Value Ref  Range Status   Specimen Description URINE, CLEAN CATCH  Final   Special Requests NONE  Final   Culture (A)  Final    10,000 COLONIES/mL KLEBSIELLA AEROGENES REPEATING SUSCEPTIBILITY Performed at [facility], [address], [place], [place] [address]    Report Status PENDING  Incomplete  Culture, blood (Routine X 2) w Reflex to ID Panel     Status: None (Preliminary result)   Collection Time: 09/26/23 12:52 PM   Specimen: BLOOD RIGHT ARM  Result Value Ref Range Status   Specimen Description BLOOD RIGHT ARM  Final   Special Requests   Final    BOTTLES DRAWN AEROBIC ONLY Blood Culture results may not be optimal due to an inadequate volume of blood received in culture bottles   Culture   Final    NO GROWTH < 24 HOURS Performed at [facility], [address], [place], [place] [address]    Report Status PENDING  Incomplete  Culture, blood (Routine X 2)  w Reflex to ID Panel     Status: None (Preliminary result)   Collection Time: 09/26/23 12:55 PM   Specimen: BLOOD LEFT ARM  Result Value Ref Range Status   Specimen Description BLOOD LEFT ARM  Final   Special Requests   Final    BOTTLES DRAWN AEROBIC AND ANAEROBIC Blood Culture adequate volume   Culture   Final    NO GROWTH < 24 HOURS Performed at [facility], [address]  [place], [place] [address]    Report Status PENDING  Incomplete  Respiratory (~20 pathogens) panel by PCR     Status: Abnormal   Collection Time: 09/27/23  9:12 AM   Specimen: Nasopharyngeal Swab; Respiratory  Result Value Ref Range Status   Adenovirus NOT DETECTED NOT DETECTED Final   Coronavirus 229E NOT DETECTED NOT DETECTED Final    Comment: (NOTE) The Coronavirus on the Respiratory Panel, DOES NOT test for the novel  Coronavirus (2019 nCoV)    Coronavirus HKU1 NOT DETECTED NOT DETECTED Final   Coronavirus NL63 NOT DETECTED NOT DETECTED Final   Coronavirus OC43 NOT DETECTED NOT DETECTED Final   Metapneumovirus NOT DETECTED NOT  DETECTED Final   Rhinovirus / Enterovirus NOT DETECTED NOT DETECTED Final   Influenza A H1 2009 DETECTED (A) NOT DETECTED Final   Influenza B NOT DETECTED NOT DETECTED Final   Parainfluenza Virus 1 NOT DETECTED NOT DETECTED Final   Parainfluenza Virus 2 NOT DETECTED NOT DETECTED Final   Parainfluenza Virus 3 NOT DETECTED NOT DETECTED Final   Parainfluenza Virus 4 NOT DETECTED NOT DETECTED Final   Respiratory Syncytial Virus NOT DETECTED NOT DETECTED Final   Bordetella pertussis NOT DETECTED NOT DETECTED Final   Bordetella Parapertussis NOT DETECTED NOT DETECTED Final   Chlamydophila pneumoniae NOT DETECTED NOT DETECTED Final   Mycoplasma pneumoniae NOT DETECTED NOT DETECTED Final    Comment: Performed at [facility], [address] [place], [place] [address]     Radiology Studies: CT ABDOMEN PELVIS W CONTRAST Addendum Date: 09/27/2023 ADDENDUM REPORT: 09/27/2023 20:33 ADDENDUM: Correction on contrast amount. CONTRAST:  75 mL Omnipaque 350 IV Electronically Signed   By: Charlett Nose M.D.   On: 09/27/2023 20:33   Result Date: 09/27/2023 CLINICAL DATA:  Sepsis EXAM: CT ABDOMEN AND PELVIS WITH CONTRAST TECHNIQUE: Multidetector CT imaging of the abdomen and pelvis was performed using the standard protocol following bolus administration of intravenous contrast. RADIATION DOSE REDUCTION: This exam was performed according to the departmental dose-optimization program which includes automated exposure control, adjustment of the mA and/or kV according to patient size and/or use of iterative reconstruction technique. CONTRAST:  100 mL Omnipaque 350 IV COMPARISON:  06/26/2023 FINDINGS: Lower chest: Moderate right pleural effusion. Compressive atelectasis in the right lower lobe. Cardiomegaly. Coronary artery and aortic atherosclerosis. Hepatobiliary: Cholecystostomy tube within the gallbladder which is decompressed, grossly unremarkable. No biliary ductal dilatation or focal hepatic abnormality.  Pancreas: No focal abnormality or ductal dilatation. Spleen: No focal abnormality.  Normal size. Adrenals/Urinary Tract: No adrenal abnormality. No focal renal abnormality. No stones or hydronephrosis. Urinary bladder is unremarkable. Stomach/Bowel: Sigmoid diverticulosis. No active diverticulitis. Stomach and small bowel decompressed, unremarkable. Vascular/Lymphatic: Aortoiliac atherosclerosis. No evidence of aneurysm or adenopathy. Reproductive: No visible focal abnormality. Other: No free fluid or free air. Small left inguinal hernia containing fat. Musculoskeletal: No acute bony abnormality. IMPRESSION: Moderate right pleural effusion.  Right lower lobe atelectasis. Cholecystostomy drain in place with decompressed gallbladder. No acute findings in the abdomen or  pelvis. Coronary artery disease, aortic atherosclerosis. Sigmoid diverticulosis. Electronically Signed: By: Charlett Nose M.D. On: 09/27/2023 20:29   DG CHEST PORT 1 VIEW Result Date: 09/27/2023 CLINICAL DATA:  Dyspnea and fever EXAM: PORTABLE CHEST 1 VIEW COMPARISON:  09/25/2023 FINDINGS: Stable cardiomegaly. Aortic atherosclerotic calcification. Coronary stenting. Patchy bilateral airspace opacities are similar to prior no definite pleural effusion. No pneumothorax. IMPRESSION: Patchy bilateral airspace opacities are similar to prior. Electronically Signed   By: Minerva Fester M.D.   On: 09/27/2023 02:09     Scheduled Meds:  allopurinol  100 mg Oral QHS   carvedilol  3.125 mg Oral BID   guaiFENesin  600 mg Oral BID   insulin aspart  0-9 Units Subcutaneous TID WC   ipratropium-albuterol  3 mL Nebulization QID   lactulose  20 g Oral BID   latanoprost  1 drop Both Eyes QHS   levothyroxine  125 mcg Oral QAC breakfast   oseltamivir  30 mg Oral Once   oseltamivir  75 mg Oral BID   rosuvastatin  5 mg Oral Daily   sodium chloride flush  3 mL Intravenous Q12H   spironolactone  25 mg Oral Daily   torsemide  20 mg Oral Daily   Continuous  Infusions:  ceFEPime (MAXIPIME) IV        LOS: 4 days    Time spent:    Zannie Cove, MD [facility]   09/28/2023, 10:05 AM

## 2023-09-28 NOTE — NC FL2 (Signed)
[place] MEDICAID FL2 LEVEL OF CARE FORM     IDENTIFICATION  Patient Name: [name] Birthdate: 27-Apr-1941 Sex: male Admission Date (Current Location): 09/23/2023  [facility] and [place] Number:  Producer, television/film/video and Address:  [facility] [place], 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 09/07/2023   Chronic rhinitis 09/07/2023   Deviated nasal septum 09/07/2023   Hypertrophy of nasal turbinates 09/07/2023   HFrEF (heart failure with reduced ejection fraction) (HCC) 09/02/2023   Stage 3a chronic kidney disease (HCC) 08/17/2023   Incontinence of feces 07/08/2023   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   Melena 06/30/2023   Preop cardiovascular exam 06/29/2023   Acute cholecystitis 06/26/2023   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   Controlled type 2 diabetes mellitus without complication (HCC) 10/14/2022   Primary osteoarthritis of left knee 10/06/2021   Ischemic cardiomyopathy 01/19/2013   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) Continent Weight: 216 lb 11.4 oz (98.3 kg) Height:  5\' 10"  (177.8 cm)  BEHAVIORAL SYMPTOMS/MOOD NEUROLOGICAL BOWEL NUTRITION STATUS      Incontinent Diet (See dc summary)  AMBULATORY STATUS COMMUNICATION OF NEEDS Skin   Limited Assist Verbally Normal                       Personal Care Assistance Level of Assistance  Bathing, Dressing, Feeding Bathing Assistance: Limited assistance Feeding assistance: Independent Dressing Assistance: Limited assistance     Functional Limitations Info  Sight, Hearing, Speech Sight Info: Impaired (eyeglassess) Hearing Info: Impaired (R and L hearing aid) Speech Info: Adequate    SPECIAL CARE FACTORS FREQUENCY  PT (By licensed PT), OT (By licensed OT)     PT Frequency: 5x week OT Frequency: 5x week            Contractures Contractures Info: Present    Additional Factors Info  Code Status, Allergies, Insulin Sliding Scale Code Status Info: Full Allergies Info: Lipitor (atorvastatin), Jardiance (empagliflozin)   Insulin Sliding Scale Info: see dc summary       Current Medications (09/28/2023):  This is the current hospital active medication list Current Facility-Administered Medications  Medication Dose Route Frequency Provider Last Rate Last Admin   acetaminophen (TYLENOL) tablet 650 mg  650 mg Oral Q6H PRN Synetta Fail, MD   650 mg at 09/27/23 1334   Or   acetaminophen (TYLENOL) suppository 650 mg  650 mg Rectal Q6H PRN Synetta Fail, MD       allopurinol (ZYLOPRIM) tablet 100 mg  100 mg Oral QHS Synetta Fail, MD   100 mg at 09/27/23 2130   carvedilol (COREG) tablet 3.125 mg  3.125 mg Oral BID Synetta Fail, MD   3.125 mg at 09/28/23 0827   ceFEPIme (MAXIPIME) 2 g in sodium chloride 0.9 % 100 mL IVPB  2 g Intravenous Q8H Zannie Cove,  MD       guaiFENesin [facility]) 12 hr tablet 600 mg  600 mg Oral BID Zannie Cove, MD   600 mg at 09/28/23 0826   guaiFENesin-dextromethorphan (ROBITUSSIN DM) 100-10 MG/5ML syrup 5 mL  5 mL Oral Q4H PRN Zannie Cove, MD   5 mL at 09/28/23 0827   insulin aspart (novoLOG) injection 0-9 Units  0-9 Units Subcutaneous TID WC Synetta Fail, MD   1 Units at 09/28/23 1202   ipratropium-albuterol (DUONEB) 0.5-2.5 (3) MG/3ML nebulizer solution 3 mL  3 mL Nebulization QID Zannie Cove, MD   3 mL at 09/28/23 1137   latanoprost (XALATAN) 0.005 % ophthalmic solution 1 drop  1 drop Both Eyes QHS Synetta Fail, MD   1 drop at 09/27/23 2144   levothyroxine (SYNTHROID) tablet 125 mcg  125 mcg Oral QAC breakfast Synetta Fail, MD   125 mcg at 09/28/23 0530   oseltamivir (TAMIFLU) capsule 75 mg  75 mg Oral BID Zannie Cove, MD       polyethylene glycol (MIRALAX / GLYCOLAX) packet 17 g  17 g Oral Daily PRN Synetta Fail, MD       rosuvastatin (CRESTOR) tablet 5 mg  5 mg Oral Daily Synetta Fail, MD   5 mg at 09/28/23 2536   sodium chloride flush (NS) 0.9 % injection 3 mL  3 mL Intravenous Q12H Synetta Fail, MD   3 mL at 09/28/23 0830   spironolactone (ALDACTONE) tablet 25 mg  25 mg Oral Daily Zannie Cove, MD   25 mg at 09/28/23 1131   torsemide (DEMADEX) tablet 20 mg  20 mg Oral Daily Zannie Cove, MD   20 mg at 09/28/23 1131   Facility-Administered Medications Ordered in Other Encounters  Medication Dose Route Frequency Provider Last Rate Last Admin   cefOXitin (MEFOXIN) 2 g in sodium chloride 0.9 % 100 mL IVPB  2 g Intravenous Once Caperilla, Marissa N, PA         Discharge Medications: Please see discharge summary for a list of discharge medications.  Relevant Imaging Results:  Relevant Lab Results:   Additional Information SSN [id number] [place], [place]

## 2023-09-28 NOTE — Procedures (Signed)
PROCEDURE SUMMARY:  Successful US guided right thoracentesis. Yielded 1.1 L of blood-tinged fluid. Pt tolerated procedure well. No immediate complications.  Specimen sent for labs. CXR ordered; no post-procedure pneumothorax identified.  EBL < 2 mL  Mickie Kay, NP 09/28/2023 12:42 PM

## 2023-09-28 NOTE — Plan of Care (Signed)

## 2023-09-28 NOTE — Progress Notes (Addendum)
PHARMACY NOTE -  ANTIBIOTIC RENAL DOSE ADJUSTMENT   Pharmacy to assist with antivitral renal dose adjustment.  Patient positive for influenza A. Initially started Tamiflu at 30 mg BID d/t CrCl < 60. Today Scr 0.96, est. crcl 68 ml/min  Increased Tamiflu dose to 75 mg BID, and ordered one time 30 mg dose to be given in addition to 30 mg dose already given this morning.   Thank you for allowing pharmacy to be a part of this patient's care.   Signe Colt, PharmD 09/28/2023 10:00 AM  **Pharmacist phone directory can be found on [email or web address] listed under [facility]**

## 2023-09-29 DIAGNOSIS — J101 Influenza due to other identified influenza virus with other respiratory manifestations: Secondary | ICD-10-CM

## 2023-09-29 DIAGNOSIS — I2510 Atherosclerotic heart disease of native coronary artery without angina pectoris: Secondary | ICD-10-CM

## 2023-09-29 DIAGNOSIS — I5023 Acute on chronic systolic (congestive) heart failure: Secondary | ICD-10-CM | POA: Diagnosis not present

## 2023-09-29 DIAGNOSIS — K810 Acute cholecystitis: Secondary | ICD-10-CM

## 2023-09-29 DIAGNOSIS — N1831 Chronic kidney disease, stage 3a: Secondary | ICD-10-CM | POA: Diagnosis not present

## 2023-09-29 DIAGNOSIS — I10 Essential (primary) hypertension: Secondary | ICD-10-CM | POA: Diagnosis not present

## 2023-09-29 LAB — URINE CULTURE: Culture: 10000 — AB

## 2023-09-29 LAB — GLUCOSE, CAPILLARY
Glucose-Capillary: 101 mg/dL — ABNORMAL HIGH (ref 70–99)
Glucose-Capillary: 133 mg/dL — ABNORMAL HIGH (ref 70–99)
Glucose-Capillary: 119 mg/dL — ABNORMAL HIGH (ref 70–99)
Glucose-Capillary: 136 mg/dL — ABNORMAL HIGH (ref 70–99)

## 2023-09-29 LAB — PROTEIN, TOTAL: Total Protein: 5.8 g/dL — ABNORMAL LOW (ref 6.5–8.1)

## 2023-09-29 LAB — LACTATE DEHYDROGENASE: LDH: 164 U/L (ref 98–192)

## 2023-09-29 LAB — CYTOLOGY - NON PAP

## 2023-09-29 MED ORDER — SALINE SPRAY 0.65 % NA SOLN
1.0000 | NASAL | Status: DC | PRN
  Filled 2023-09-29: qty 44

## 2023-09-29 NOTE — Assessment & Plan Note (Signed)
Continue with statin therapy.  ?

## 2023-09-29 NOTE — Assessment & Plan Note (Addendum)
Patient was placed on insulin sliding scale for glucose cover and monitoring.  His glucose remained stable.

## 2023-09-29 NOTE — Assessment & Plan Note (Signed)
No active chest pain.  

## 2023-09-29 NOTE — Assessment & Plan Note (Signed)
Continue blood pressure control with carvedilol.  

## 2023-09-29 NOTE — Assessment & Plan Note (Addendum)
Cell count has been stable, follow up as outpatient, Likely anemia of chronic medical diease.

## 2023-09-29 NOTE — Progress Notes (Signed)
Mobility Specialist Progress Note:   09/29/23 1145  Mobility  Activity Ambulated with assistance to bathroom  Level of Assistance Contact guard assist, steadying assist  Assistive Device Other (Comment) (HHA)  Distance Ambulated (ft) 20 ft  Activity Response Tolerated fair  Mobility Referral Yes  Mobility visit 1 Mobility  Mobility Specialist Start Time (ACUTE ONLY) 1145  Mobility Specialist Stop Time (ACUTE ONLY) 1200  Mobility Specialist Time Calculation (min) (ACUTE ONLY) 15 min   Responded to chair alarm. Pt found standing at chair, urinating on floor, nasal cannula off. Pt then requesting to go to BR for BM. Required contact assist through [facility] for safety. Pt left in BR with RN in room.   Addison Lank Mobility Specialist Please contact via SecureChat or  Rehab office at [phone]

## 2023-09-29 NOTE — Assessment & Plan Note (Addendum)
Sp cholecystostomy tube.  Working appropriately with no signs of local infection  Follow up with IR recommendations.  Capping trial tolerating well.

## 2023-09-29 NOTE — TOC Progression Note (Signed)
Transition of Care [facility]) - Progression Note    Patient Details  Name: [name] MRN: [id number] Date of Birth: 09/02/1941  Transition of Care [facility]) CM/SW Contact  Michaela Corner, [place] Phone Number: 09/29/2023, 11:09 AM  Clinical Narrative:   CSW spoke with [name] (pts dtr) about accepting bed offers at this time. [name] states she wants pt to go to [facility]; CSW informed [name] that [facility] has declined at this time. CSW presented [name] with other bed offers and she stated she will review them and get back to CSW with her choice.   CSW attempted to give pt bed offers but he was sleeping upon entry and did not wake to verbal command.           Skilled Nursing Rehab Facilities-   [email or web address]     Ratings out of 5 stars (5 the highest)    Name Address  Phone # Quality Care Staffing Health Inspection Overall  [facility] [address], [place] [phone] 2 4 4   [facility] [address], [place] [phone] 2 1 1 1   [facility] [address], [facility] [phone] 2 2 2   [facility] [address], [place] [phone] 5 1 2 2   [facility] [phone] [address] [place] [phone] 1 1 3 1   [facility] [address] [place], [place] [phone] 2 2 1 1   [facility] [address] [place] [phone] 4 2 1 1    TOC will continue to follow.     Expected Discharge Plan: Skilled Nursing Facility Barriers to Discharge: Continued Medical Work up, English as a second language teacher, SNF Pending bed offer  Expected Discharge Plan and Services In-house Referral: Clinical Social Work     Living arrangements for the past 2 months: Skilled Nursing Facility Expected Discharge Date: 09/27/23                                     Social Determinants of Health (SDOH) Interventions SDOH Screenings   Food Insecurity: No Food Insecurity (09/24/2023)  Housing: Low Risk   (09/24/2023)  Transportation Needs: No Transportation Needs (09/24/2023)  Utilities: Not At Risk (09/24/2023)  Depression (PHQ2-9): Low Risk  (10/14/2022)  Social Connections: Unknown (09/24/2023)  Tobacco Use: Low Risk  (09/21/2023)    Readmission Risk Interventions     No data to display

## 2023-09-29 NOTE — Assessment & Plan Note (Addendum)
Echocardiogram with reduced LV systolic function with EF 25 to 30%, RV systolic function with moderate reduction, dyskinetic anterior septum, basal anterior segment, and basal infero septal segment. RV with mild reduction in systolic function, RVSP 52.6, LA with severe dilatation, RA with moderate dilatation,   Patient was placed on IV furosemide, negative fluid balance was achieved - 3,216 ml, with significant improvement in his symptoms.   Plan to continue torsemide, spironolactone and carvedilol.   Acute hypoxemic respiratory failure, acute cardiogenic pulmonary edema with right pleural effusion.  Right thoracentesis 1,1 L  Mainly lymphocytic with 73% out of 575 wbc  Per Light's criteria an exudate.  Fluid culture with no growth no organisms,   Oxygenation has improved and at the time of his discharge his 02 saturation is 95% on room air.

## 2023-09-29 NOTE — Progress Notes (Signed)
Supervising Physician: Marliss Coots  Patient Status:  [facility] - In-pt  Chief Complaint: Acute cholecystitis s/p percutaneous cholecystostomy drain placement in IR 06/29/23 and percutaneous gallstone retrieval 09/21/23. Patient admitted with acute CHF and respiratory failure.   Subjective: Patient sitting up in the chair eating lunch. He denies pain or discomfort and states he is hopeful for discharge home today.   Allergies: Lipitor [atorvastatin] and Jardiance [empagliflozin]  Medications: Prior to Admission medications   Medication Sig Start Date End Date Taking? Authorizing Provider  acetaminophen (TYLENOL) 325 MG tablet Take 2 tablets (650 mg total) by mouth every 4 (four) hours as needed for headache or mild pain. 05/27/23  Yes Sheilah Pigeon, PA-C  allopurinol (ZYLOPRIM) 100 MG tablet Take 100 mg by mouth at bedtime. 05/14/15  Yes [provider]  apixaban (ELIQUIS) 5 MG TABS tablet Take 5 mg by mouth 2 (two) times daily.   Yes [provider]  carvedilol (COREG) 3.125 MG tablet TAKE 1 TABLET TWICE DAILY 07/29/23  Yes Nahser, Deloris Ping, MD  furosemide (LASIX) 40 MG tablet Take 40 mg by mouth every other day.   Yes [provider]  latanoprost (XALATAN) 0.005 % ophthalmic solution Place 1 drop into both eyes at bedtime.  10/03/13  Yes [provider]  levothyroxine (SYNTHROID) 125 MCG tablet Take 1 tablet (125 mcg total) by mouth daily before breakfast. 08/03/23  Yes Nahser, Deloris Ping, MD  nitroGLYCERIN (NITROSTAT) 0.4 MG SL tablet Place 1 tablet (0.4 mg total) under the tongue every 5 (five) minutes as needed for chest pain. 12/12/19  Yes Nahser, Deloris Ping, MD  potassium chloride (KLOR-CON M) 10 MEQ tablet Take 10 mEq by mouth every other day.   Yes [provider]  rosuvastatin (CRESTOR) 10 MG tablet TAKE 1/2 TABLET EVERY DAY 03/24/23  Yes Nahser, Deloris Ping, MD  sodium chloride flush (NS) 0.9 % SOLN Flush gallbladder drain once daily with at  least 5 ml normal saline. Discard extra 5ml after use of the 10ml syringe 06/30/23  Yes Nella Botsford, Arman Filter, NP  chlorpheniramine-HYDROcodone (TUSSIONEX) 10-8 MG/5ML Take 5 mLs by mouth every 12 (twelve) hours as needed. Patient not taking: Reported on 09/23/2023 08/16/23   [provider]  doxycycline (VIBRAMYCIN) 100 MG capsule Take 100 mg by mouth 2 (two) times daily. Patient not taking: Reported on 09/23/2023 08/16/23   [provider]  methylPREDNISolone (MEDROL DOSEPAK) 4 MG TBPK tablet Take 4 mg by mouth. Patient not taking: Reported on 09/23/2023 08/16/23   [provider]     Vital Signs: BP (!) 108/52 (BP Location: Left Arm)   Pulse (!) 57   Temp 98.3 F (36.8 C) (Oral)   Resp 18   Ht 5\' 10"  (1.778 m)   Wt 218 lb 11.1 oz (99.2 kg)   SpO2 97%   BMI 31.38 kg/m   Physical Exam Constitutional:      General: He is not in acute distress.    Appearance: He is not ill-appearing.  Cardiovascular:     Rate and Rhythm: Bradycardia present.  Pulmonary:     Effort: Pulmonary effort is normal.  Abdominal:     Tenderness: There is no abdominal tenderness.     Comments: RUQ drain to gravity with approximately 10 ml thin, clear bile in bag. Dressing is clean, dry and intact. The drain has now been capped.   Skin:    General: Skin is warm and dry.  Neurological:     Mental Status:  He is alert and oriented to person, place, and time.     Imaging: IR THORACENTESIS ASP PLEURAL SPACE W/IMG GUIDE Result Date: 09/28/2023 INDICATION: Patient with a history of heart failure and atrial fibrillation presents today with a right pleural effusion. Interventional radiology asked to perform a diagnostic and therapeutic thoracentesis. EXAM: ULTRASOUND GUIDED THORACENTESIS MEDICATIONS: 1% lidocaine 10 mL COMPLICATIONS: None immediate. PROCEDURE: An ultrasound guided thoracentesis was thoroughly discussed with the patient and questions answered. The benefits, risks, alternatives  and complications were also discussed. The patient understands and wishes to proceed with the procedure. Written consent was obtained. Ultrasound was performed to localize and mark an adequate pocket of fluid in the right chest. The area was then prepped and draped in the normal sterile fashion. 1% Lidocaine was used for local anesthesia. Under ultrasound guidance a 6 Fr Safe-T-Centesis catheter was introduced. Thoracentesis was performed. The catheter was removed and a dressing applied. FINDINGS: A total of approximately 1.1 L of blood-tinged fluid was removed. Samples were sent to the laboratory as requested by the clinical team. IMPRESSION: Successful ultrasound guided right thoracentesis yielding 1.1 L of pleural fluid. Procedure performed by Alwyn Ren NP Electronically Signed   By: Marliss Coots M.D.   On: 09/28/2023 12:58   DG Chest 1 View Result Date: 09/28/2023 CLINICAL DATA:  Status post thoracentesis on the right. EXAM: CHEST  1 VIEW COMPARISON:  Radiographs 09/27/2023 and 09/25/2023. CT 05/24/2023 and 09/27/2023. FINDINGS: 1102 hours. Right pleural effusion has mildly decreased in volume. There is no evidence of pneumothorax. Residual right perihilar and bibasilar atelectasis. There is stable cardiac enlargement. The bones appear unchanged. IMPRESSION: Mild decrease in right pleural effusion following thoracentesis. No evidence of pneumothorax. Electronically Signed   By: Carey Bullocks M.D.   On: 09/28/2023 11:25   CT ABDOMEN PELVIS W CONTRAST Addendum Date: 09/27/2023 ADDENDUM REPORT: 09/27/2023 20:33 ADDENDUM: Correction on contrast amount. CONTRAST:  75 mL Omnipaque 350 IV Electronically Signed   By: Charlett Nose M.D.   On: 09/27/2023 20:33   Result Date: 09/27/2023 CLINICAL DATA:  Sepsis EXAM: CT ABDOMEN AND PELVIS WITH CONTRAST TECHNIQUE: Multidetector CT imaging of the abdomen and pelvis was performed using the standard protocol following bolus administration of intravenous contrast.  RADIATION DOSE REDUCTION: This exam was performed according to the departmental dose-optimization program which includes automated exposure control, adjustment of the mA and/or kV according to patient size and/or use of iterative reconstruction technique. CONTRAST:  100 mL Omnipaque 350 IV COMPARISON:  06/26/2023 FINDINGS: Lower chest: Moderate right pleural effusion. Compressive atelectasis in the right lower lobe. Cardiomegaly. Coronary artery and aortic atherosclerosis. Hepatobiliary: Cholecystostomy tube within the gallbladder which is decompressed, grossly unremarkable. No biliary ductal dilatation or focal hepatic abnormality. Pancreas: No focal abnormality or ductal dilatation. Spleen: No focal abnormality.  Normal size. Adrenals/Urinary Tract: No adrenal abnormality. No focal renal abnormality. No stones or hydronephrosis. Urinary bladder is unremarkable. Stomach/Bowel: Sigmoid diverticulosis. No active diverticulitis. Stomach and small bowel decompressed, unremarkable. Vascular/Lymphatic: Aortoiliac atherosclerosis. No evidence of aneurysm or adenopathy. Reproductive: No visible focal abnormality. Other: No free fluid or free air. Small left inguinal hernia containing fat. Musculoskeletal: No acute bony abnormality. IMPRESSION: Moderate right pleural effusion.  Right lower lobe atelectasis. Cholecystostomy drain in place with decompressed gallbladder. No acute findings in the abdomen or pelvis. Coronary artery disease, aortic atherosclerosis. Sigmoid diverticulosis. Electronically Signed: By: Charlett Nose M.D. On: 09/27/2023 20:29   DG CHEST PORT 1 VIEW Result Date: 09/27/2023 CLINICAL DATA:  Dyspnea and  fever EXAM: PORTABLE CHEST 1 VIEW COMPARISON:  09/25/2023 FINDINGS: Stable cardiomegaly. Aortic atherosclerotic calcification. Coronary stenting. Patchy bilateral airspace opacities are similar to prior no definite pleural effusion. No pneumothorax. IMPRESSION: Patchy bilateral airspace opacities are  similar to prior. Electronically Signed   By: Minerva Fester M.D.   On: 09/27/2023 02:09    Labs:  CBC: Recent Labs    09/23/23 1727 09/25/23 0220 09/27/23 0240 09/28/23 0243  WBC 10.3 8.3 4.8 4.2  HGB 12.4* 12.1* 11.5* 11.4*  HCT 37.5* 37.6* 35.8* 36.2*  PLT 212 250 243 236    COAGS: Recent Labs    06/27/23 0919 06/27/23 1904 06/28/23 0828 06/28/23 1811 06/28/23 2349 09/21/23 0840  INR  --   --   --   --  1.2 1.3*  APTT 40* 137* 152* 78*  --   --     BMP: Recent Labs    09/25/23 0220 09/26/23 0735 09/27/23 0240 09/28/23 0243  NA 134* 134* 133* 128*  K 3.5 3.7 3.8 4.1  CL 101 98 97* 95*  CO2 25 24 27 26   GLUCOSE 119* 119* 114* 111*  BUN 22 22 24* 19  CALCIUM 8.1* 8.5* 8.4* 8.2*  CREATININE 1.13 1.24 1.23 0.96  GFRNONAA >60 58* 58* >60    LIVER FUNCTION TESTS: Recent Labs    09/21/23 0840 09/25/23 0220 09/27/23 0240 09/28/23 0243 09/29/23 0228  BILITOT 2.3* 0.9 0.8 0.7  --   AST 19 44* 41 40  --   ALT 12 27 24 22   --   ALKPHOS 80 111 94 93  --   PROT 7.2 6.0* 6.2* 6.2* 5.8*  ALBUMIN 3.7 2.4* 2.8* 2.7*  --     Assessment and Plan:  Acute cholecystitis s/p percutaneous cholecystostomy drain placement in IR 06/29/23 and percutaneous gallstone retrieval 09/21/23. Patient admitted with acute CHF and respiratory failure.   The patient's gallbladder drain was capped today for a capping trial. He was given an extra gravity bag to take home with him and he knows he needs to reconnect the drain to the gravity bag if he develops abdominal pain. The patient will follow up with Dr. Elby Showers in approximately two weeks for a drain evaluation and possible drain removal. This information was shared with the patient and the patient's daughter via telephone. A scheduler from our office will call the patient to arrange the follow up appointment.   Electronically Signed: Alwyn Ren, AGACNP-BC 09/29/2023, 12:25 PM   I spent a total of 15 Minutes at the the  patient's bedside AND on the patient's hospital floor or unit, greater than 50% of which was counseling/coordinating care for cholelithiasis.

## 2023-09-29 NOTE — Assessment & Plan Note (Addendum)
Hyponatremia.   His renal function has improved, at the time of discharge his serum cr is 1,0 with K at 3,8 and serum bicarbonate at 28  Na 134  Mg 1,8   Plan to continue diuresis at home with torsemide, follow up renal function as outpatient.  Add Kcl supplementation.

## 2023-09-29 NOTE — Progress Notes (Addendum)
Physical Therapy Treatment Patient Details Name: [name] MRN: [id number] DOB: [birth year] Today's Date: 09/29/2023   History of Present Illness 83 y/o M presenting to ED on 1/16 with SOB, admitted for acute on chronic combined systolic and diastolic CHF. Pt being followed by IR for acute cholecysititis, biliary drain placed 1/14. Pt tested positive for flu 1/20 and was started on Tamiflu. Pt underwent US guided R thoracentesis 1/21. PMH includes paroxysmal A fib on Eliquis, CHF, CKD, dyslipidemia    PT Comments  Pt greeted in recliner chair, pleasant and agreeable to PT treatment. Pt requires multiple attempts to obtain standing with minA. Increased gait distance this session using RW with CGA and chair follow for safety and two rest breaks utilized. Pt tolerated oxygen being titrated down to 3L with mobility, SpO2 maintained >95%. Patient will benefit from continued inpatient follow up therapy, <3 hours/day to reduce oxygen needs, increase strength, improve activity tolerance, and restore independence with functional mobility. Will continue to follow acutely and progress appropriately.    If plan is discharge home, recommend the following: Assistance with cooking/housework;Direct supervision/assist for medications management;Direct supervision/assist for financial management;Assist for transportation;Help with stairs or ramp for entrance;A little help with walking and/or transfers   Can travel by private vehicle     Yes  Equipment Recommendations  None recommended by PT    Recommendations for Other Services       Precautions / Restrictions Precautions Precautions: Fall Restrictions Weight Bearing Restrictions Per Provider Order: No     Mobility  Bed Mobility               General bed mobility comments: Pt seated in recliner chair at start of session.    Transfers Overall transfer level: Needs assistance Equipment used: Rolling walker (2 wheels) Transfers: Sit to/from  Stand Sit to Stand: Min assist           General transfer comment: Pt requires multiple attempts to reach standing, uses the rocking method to help power up. Pt demonstrated appropriate hand placement when standing up and sitting down.    Ambulation/Gait Ambulation/Gait assistance: Contact guard assist, +2 safety/equipment Gait Distance (Feet): 250 Feet (1x250ft followed by seated rest break & 1x225ft followed by seated rest and being pushed back to the room secondary to fatigue.) Assistive device: Rolling walker (2 wheels) Gait Pattern/deviations: Decreased stride length, Trunk flexed, Step-to pattern   Gait velocity interpretation: <1.31 ft/sec, indicative of household ambulator   General Gait Details: Pt ambulates slowly with small steps, knees maintained in slight flex, and flexed posture. Pt states that this is his baseline. Cued pt to move closer to RW and assisted with increasing safety awareness in hallways. Chair follow to allow for rest breaks utilized twice.   Stairs             Wheelchair Mobility     Tilt Bed    Modified Rankin (Stroke Patients Only)       Balance Overall balance assessment: Needs assistance         Standing balance support: Bilateral upper extremity supported, During functional activity, Reliant on assistive device for balance Standing balance-Leahy Scale: Poor                              Cognition Arousal: Lethargic Behavior During Therapy: Flat affect Overall Cognitive Status: No family/caregiver present to determine baseline cognitive functioning  General Comments: Pt is hard of hearing requires repetition and increase time to respond to questions and follow commands. Pt oriented x4.        Exercises      General Comments General comments (skin integrity, edema, etc.): VSS, titrated down O2 to 3L via [place] during mobility with SpO2 >95% maintained.      Pertinent  Vitals/Pain Pain Assessment Pain Assessment: 0-10 Pain Score: 4  Faces Pain Scale: Hurts a little bit Pain Location: R side Pain Descriptors / Indicators: Dull, Discomfort, Sore Pain Intervention(s): Monitored during session    Home Living                          Prior Function            PT Goals (current goals can now be found in the care plan section) Acute Rehab PT Goals Patient Stated Goal: Go Home Progress towards PT goals: Progressing toward goals    Frequency    Min 1X/week      PT Plan      Co-evaluation              AM-PAC PT "6 Clicks" Mobility   Outcome Measure  Help needed turning from your back to your side while in a flat bed without using bedrails?: A Little Help needed moving from lying on your back to sitting on the side of a flat bed without using bedrails?: A Little Help needed moving to and from a bed to a chair (including a wheelchair)?: A Little Help needed standing up from a chair using your arms (e.g., wheelchair or bedside chair)?: A Little Help needed to walk in hospital room?: A Little Help needed climbing 3-5 steps with a railing? : A Lot 6 Click Score: 17    End of Session Equipment Utilized During Treatment: Gait belt;Oxygen Activity Tolerance: Patient limited by fatigue Patient left: in chair;with chair alarm set;with call bell/phone within reach Nurse Communication: Mobility status;Other (comment) (Updated about O2 being titrated down to 3L and pt responding well.) PT Visit Diagnosis: Unsteadiness on feet (R26.81);Other abnormalities of gait and mobility (R26.89);Muscle weakness (generalized) (M62.81);Difficulty in walking, not elsewhere classified (R26.2)     Time: 0930-1000 PT Time Calculation (min) (ACUTE ONLY): 30 min  Charges:    $Gait Training: 23-37 mins                   Cheri Guppy, PT, DPT Acute Rehabilitation Services Office: [phone] Secure Chat Preferred   Richardson Chiquito 09/29/2023,  11:10 AM

## 2023-09-29 NOTE — Plan of Care (Signed)
  Problem: Activity: Goal: Risk for activity intolerance will decrease Outcome: Progressing   Problem: Safety: Goal: Ability to remain free from injury will improve Outcome: Progressing   Problem: Activity: Goal: Capacity to carry out activities will improve Outcome: Progressing   

## 2023-09-29 NOTE — Progress Notes (Addendum)
Bed currently malfunctioning. Failure to maintain consistent height at foot of bed resulting in wildly variable weights, all representing 30-50% weight loss. Due to respiratory distress and debility, unsafe to stand patient at this time. Will notify charge to replace bed ASAP.  Addendum: patient demonstrated ability to ambulate on supplemental O2. Weight obtained at this time.

## 2023-09-29 NOTE — Assessment & Plan Note (Addendum)
Continue rate control with carvedilol.  Anticoagulation with apixaban,

## 2023-09-29 NOTE — Assessment & Plan Note (Addendum)
Patient was placed on osetlamivir with good toleration along with supportive medical care.   Acute metabolic encephalopathy resolved and deconditioning, clinically improving.  Patient will continue home health services. He has a walker at home.

## 2023-09-29 NOTE — Plan of Care (Signed)

## 2023-09-29 NOTE — Progress Notes (Signed)
Progress Note   Patient: [name] KGM:[id number] DOB: 1941-01-16 DOA: 09/23/2023     5 DOS: the patient was seen and examined on 09/29/2023   Brief hospital course: 82/M with chronic combined CHF, EF 25-30%, CAD, paroxysmal A-fib, chronic anemia, CKD 3A, gout, chronic cholecystitis with cholecystostomy tube. He is followed by IR due to acute cholecystitis in October when he was deemed not a surgical candidate.  IR placed cholecystostomy tube.  Patient then underwent successful percutaneous gallstone removal and exchange of biliary drain on 1/14. -Subsequent to this noted more dyspnea on exertion and cough -In the ER mildly tachypneic, creatinine 1.03, WBC 10.3, BNP 456, troponin 25, respiratory virus panel negative for flu COVID and RSV, CXR noted cardiomegaly, moderate right pleural effusion, pulmonary vascular congestion. -Admitted started on diuretics -1/19, noted to be somnolent and drowsy, subsequently spiked a temp of 102,  -1/20, lethargic, positive for flu started on Tamiflu  Assessment and Plan: * Acute on chronic systolic CHF (congestive heart failure) (HCC) Echocardiogram with reduced LV systolic function with EF 25 to 30%, RV systolic function with moderate reduction, dyskinetic anterior septum, basal anterior segment, and basal infero septal segment. RV with mild reduction in systolic function, RVSP 52.6, LA with severe dilatation, RA with moderate dilatation,   Urine outout is 1,100 ml Systolic blood pressure 108 mmHg range.   Plan to continue torsemide, spironolactone ad carvedilol.    Acute hypoxemic respiratory failure, acute cardiogenic pulmonary edema with right pleural effusion.  Right thoracentesis 1,1 L  Mainly lymphocytic with 73% out of 575 wbc  Hold on antibiotic therapy for now.   Influenza A Continue osetlamivir.  Supportive medical care.   Essential hypertension, benign Continue blood pressure control with carvedilol.   Stage 3a chronic kidney  disease (HCC) Hyponatremia.   Renal function with serum cr at 0,96 with K at 4,1 and serum bicarbonate at 26  Na 128   Atrial fibrillation (HCC) Continue rate control with carvedilol.  Holding on anticoagulation for now.   Coronary artery disease No active chest pain.   Anemia Cell count has been stable.   Controlled type 2 diabetes mellitus without complication (HCC) Continue glucose cover and monitoring with insulin sliding scale.   Gout No acute flare.   Dyslipidemia Continue with statin therapy.   Acute cholecystitis Sp cholecystostomy tube.  Working appropriately with no signs of local infection  Follow up with IR recommendations.  Capping trial today.         Subjective: Patient with no chest pain, dyspnea has been improving, no lower extremity edema, no nausea or vomiting, continue very weak and deconditioned   Physical Exam: Vitals:   09/29/23 1155 09/29/23 1227 09/29/23 1614 09/29/23 1705  BP: (!) 108/52  131/76   Pulse: (!) 57  91   Resp: 18  16   Temp: 98.3 F (36.8 C)  97.6 F (36.4 C)   TempSrc: Oral  Oral   SpO2: 97% 96% 99% 98%  Weight:      Height:       Neurology awake and alert ENT with mild pallor Cardiovascular with S1 and S2 present and regular with no gallops, rubs or murmurs Respiratory with mild rales at bases with no wheezing or rhonchi Abdomen with no distention. Biliary drain in place.  No lower extremity edema  Data Reviewed:    Family Communication: no family at the bedside   Disposition: Status is: Inpatient Remains inpatient appropriate because: recovering Influenza   Planned Discharge Destination: Home  Author: Coralie Keens, MD 09/29/2023 5:53 PM  For on call review [email or web address].

## 2023-09-29 NOTE — Hospital Course (Addendum)
Mr. [name] was admitted to the hospital with the working diagnosis of heart failure decompensation, complicated with Influenza A infection.   82/M past medical history  CHF, EF 25-30%, CAD, paroxysmal A-fib, chronic anemia, CKD 3A, gout, chronic cholecystitis with cholecystostomy tube.  He is followed by IR due to acute cholecystitis in October when he was deemed not a surgical candidate.  IR placed cholecystostomy tube.   Patient then underwent successful percutaneous gallstone removal and exchange of biliary drain on 1/14. At home patient was noted to have worsening dyspnea on exertion. On the day of admission he was note able to work with home PT due to dyspnea, and it was recommended him to go to the ED.  On his initial physical examination his blood pressure was 136/75, HR 77, RR 25 and 02 saturation 93%, lungs with rales bilaterally with no wheezing, heart with S1 and S2 present, irregularly irregular with no gallops, rubs or murmurs, abdomen with bilary drain in place, positive lower extremity edema.   Na 133, K 3,8 Cl 102 bicarbonate 21, glucose 117, bun 25 cr 1,0  BNP 456  High sensitive troponin 25 and 27  CRP 18,8  Procalcitonin 0,20  Wbc 10,3 hgb 12.4 plt 212   SARS covid 19 negative   Chest radiograph with cardiomegaly, bilateral hilar vascular congestion, right pleural effusion, fluid in the right fissure.   EKG 83 bpm, left axis deviation, left anterior fascicular block, left bundle branch block, atrial fibrillation rhythm with PVC, no significant ST segment or T wave changes.   -Admitted started on diuretics -1/19, noted to be somnolent and drowsy, subsequently spiked a temp of 102,  -1/20, lethargic, positive for Influenza A H1 2009, started on Tamiflu 01/24 clinically improved mentation and volume status.  Biliary drain capped per IR.  Patient will continue with home health services.

## 2023-09-29 NOTE — Assessment & Plan Note (Signed)
No acute flare.  

## 2023-09-30 DIAGNOSIS — I5023 Acute on chronic systolic (congestive) heart failure: Secondary | ICD-10-CM | POA: Diagnosis not present

## 2023-09-30 DIAGNOSIS — N1831 Chronic kidney disease, stage 3a: Secondary | ICD-10-CM | POA: Diagnosis not present

## 2023-09-30 DIAGNOSIS — J101 Influenza due to other identified influenza virus with other respiratory manifestations: Secondary | ICD-10-CM | POA: Diagnosis not present

## 2023-09-30 DIAGNOSIS — I10 Essential (primary) hypertension: Secondary | ICD-10-CM | POA: Diagnosis not present

## 2023-09-30 LAB — BASIC METABOLIC PANEL
Anion gap: 7 (ref 5–15)
BUN: 21 mg/dL (ref 8–23)
CO2: 29 mmol/L (ref 22–32)
Calcium: 8.2 mg/dL — ABNORMAL LOW (ref 8.9–10.3)
Chloride: 98 mmol/L (ref 98–111)
Creatinine, Ser: 1.02 mg/dL (ref 0.61–1.24)
GFR, Estimated: >60 mL/min (ref >60)
Glucose, Bld: 104 mg/dL — ABNORMAL HIGH (ref 70–99)
Potassium: 4.1 mmol/L (ref 3.5–5.1)
Sodium: 134 mmol/L — ABNORMAL LOW (ref 135–145)

## 2023-09-30 LAB — CBC
HCT: 35.6 % — ABNORMAL LOW (ref 39.0–52.0)
Hemoglobin: 11.6 g/dL — ABNORMAL LOW (ref 13.0–17.0)
MCH: 30.1 pg (ref 26.0–34.0)
MCHC: 32.6 g/dL (ref 30.0–36.0)
MCV: 92.5 fL (ref 80.0–100.0)
Platelets: 256 10*3/uL (ref 150–400)
RBC: 3.85 MIL/uL — ABNORMAL LOW (ref 4.22–5.81)
RDW: 13.7 % (ref 11.5–15.5)
WBC: 4.2 10*3/uL (ref 4.0–10.5)
nRBC: 0 % (ref 0.0–0.2)

## 2023-09-30 LAB — GLUCOSE, CAPILLARY
Glucose-Capillary: 97 mg/dL (ref 70–99)
Glucose-Capillary: 225 mg/dL — ABNORMAL HIGH (ref 70–99)
Glucose-Capillary: 107 mg/dL — ABNORMAL HIGH (ref 70–99)
Glucose-Capillary: 131 mg/dL — ABNORMAL HIGH (ref 70–99)

## 2023-09-30 MED ORDER — APIXABAN 5 MG PO TABS
5.0000 mg | ORAL_TABLET | Freq: Two times a day (BID) | ORAL | Status: DC
  Administered 2023-09-30 – 2023-10-01 (×2): 5 mg via ORAL
  Filled 2023-09-30 (×2): qty 1

## 2023-09-30 MED ORDER — IPRATROPIUM-ALBUTEROL 0.5-2.5 (3) MG/3ML IN SOLN
3.0000 mL | Freq: Two times a day (BID) | RESPIRATORY_TRACT | Status: DC
  Administered 2023-10-01: 3 mL via RESPIRATORY_TRACT
  Filled 2023-09-30: qty 3

## 2023-09-30 NOTE — TOC Progression Note (Signed)
Transition of Care [facility]) - Progression Note    Patient Details  Name: [name] MRN: [id number] Date of Birth: 05-06-1941  Transition of Care [facility]) CM/SW Contact  Leone Haven, RN Phone Number: 09/30/2023, 4:40 PM  Clinical Narrative:    NCM spoke with daughter, she states they would like [facility] if he goes home, [facility] made referral to [facility], she states he is already active with them for [facility], HHPT, HHOT, she would just need orders if they decided to go home with [facility] after they see how he does tomorrow.    Expected Discharge Plan: Skilled Nursing Facility Barriers to Discharge: Continued Medical Work up, English as a second language teacher, SNF Pending bed offer  Expected Discharge Plan and Services In-house Referral: Clinical Social Work     Living arrangements for the past 2 months: Skilled Nursing Facility Expected Discharge Date: 09/27/23                                     Social Determinants of Health (SDOH) Interventions SDOH Screenings   Food Insecurity: No Food Insecurity (09/24/2023)  Housing: Low Risk  (09/24/2023)  Transportation Needs: No Transportation Needs (09/24/2023)  Utilities: Not At Risk (09/24/2023)  Depression (PHQ2-9): Low Risk  (10/14/2022)  Social Connections: Unknown (09/24/2023)  Tobacco Use: Low Risk  (09/21/2023)    Readmission Risk Interventions     No data to display

## 2023-09-30 NOTE — TOC Progression Note (Addendum)
Transition of Care [facility]) - Progression Note    Patient Details  Name: [name] MRN: [id number] Date of Birth: 1940/09/20  Transition of Care [facility]) CM/SW Contact  Michaela Corner, [place] Phone Number: 09/30/2023, 11:14 AM  Clinical Narrative:   CSW spoke with [name], pts dtr, and she chose [name] as her first choice at this time. CSW explained next steps for submitting to insurance for auth but [name] asked CSW to hold off as she wants to speak with MD regarding recs for SNF. [name] states her father is doing better and when originally seen he was weak. CSW notified MD that [name] would like to discuss level of care for pt further.   4:14PM: CSW spoke with [name] about dc plan. [name] states her father did better today and she would like to see how well he does tomorrow before moving forward.   TOC will continue to follow.     Expected Discharge Plan: Skilled Nursing Facility Barriers to Discharge: Continued Medical Work up, English as a second language teacher, SNF Pending bed offer  Expected Discharge Plan and Services In-house Referral: Clinical Social Work     Living arrangements for the past 2 months: Skilled Nursing Facility Expected Discharge Date: 09/27/23                                     Social Determinants of Health (SDOH) Interventions SDOH Screenings   Food Insecurity: No Food Insecurity (09/24/2023)  Housing: Low Risk  (09/24/2023)  Transportation Needs: No Transportation Needs (09/24/2023)  Utilities: Not At Risk (09/24/2023)  Depression (PHQ2-9): Low Risk  (10/14/2022)  Social Connections: Unknown (09/24/2023)  Tobacco Use: Low Risk  (09/21/2023)    Readmission Risk Interventions     No data to display

## 2023-09-30 NOTE — Progress Notes (Signed)
Mobility Specialist Progress Note:   09/30/23 0934  Mobility  Activity Transferred from bed to chair  Level of Assistance Minimal assist, patient does 75% or more  Assistive Device Other (Comment) (HHA)  Distance Ambulated (ft) 5 ft  Activity Response Tolerated well  Mobility Referral Yes  Mobility visit 1 Mobility  Mobility Specialist Start Time (ACUTE ONLY) 0934  Mobility Specialist Stop Time (ACUTE ONLY) 0950  Mobility Specialist Time Calculation (min) (ACUTE ONLY) 16 min   Pt agreeable to mobility session. Required minA via HHA to stand and pivot to chair. VSS on 1LO2. Pt left with all needs met, alarm on.  Addison Lank Mobility Specialist Please contact via SecureChat or  Rehab office at [phone]

## 2023-09-30 NOTE — Progress Notes (Signed)
Occupational Therapy Treatment Patient Details Name: [name] MRN: [id number] DOB: 1941-03-29 Today's Date: 09/30/2023   History of present illness 83 y/o M presenting to ED on 1/16 with SOB, admitted for acute on chronic combined systolic and diastolic CHF. Pt being followed by IR for acute cholecysititis, biliary drain placed 1/14. Pt tested positive for flu 1/20 and was started on Tamiflu. Pt underwent US guided R thoracentesis 1/21. PMH includes paroxysmal A fib on Eliquis, CHF, CKD, dyslipidemia   OT comments  Pt with slower progress towards OT goals today due to reported frustration with hospitalization and desire to return home. Pt minimally participatory despite encouragement- required Mod A to stand but once up, able to take steps at bedside with RW. Pt requiring assistance to cut up food on breakfast tray; declined other ADL retraining at this time. Patient will benefit from continued inpatient follow up therapy, <3 hours/day at DC.      If plan is discharge home, recommend the following:  A little help with walking and/or transfers;A little help with bathing/dressing/bathroom;Assistance with cooking/housework;Direct supervision/assist for medications management;Direct supervision/assist for financial management;Assist for transportation;Help with stairs or ramp for entrance   Equipment Recommendations  Other (comment) (TBD)    Recommendations for Other Services      Precautions / Restrictions Precautions Precautions: Fall Restrictions Weight Bearing Restrictions Per Provider Order: No       Mobility Bed Mobility Overal bed mobility: Needs Assistance Bed Mobility: Sit to Supine       Sit to supine: Min assist   General bed mobility comments: EOB on entry, Min A for LE back to bed    Transfers Overall transfer level: Needs assistance Equipment used: Rolling walker (2 wheels) Transfers: Sit to/from Stand Sit to Stand: Mod assist           General  transfer comment: multiple standing attempts needed with Mod A to achieve full stand at bedside with RW. CGA for steps at bedside, as pt declined further mobility     Balance Overall balance assessment: Needs assistance Sitting-balance support: No upper extremity supported, Feet supported Sitting balance-Leahy Scale: Fair     Standing balance support: Bilateral upper extremity supported, During functional activity, Reliant on assistive device for balance Standing balance-Leahy Scale: Poor                             ADL either performed or assessed with clinical judgement   ADL Overall ADL's : Needs assistance/impaired Eating/Feeding: Set up;Sitting Eating/Feeding Details (indicate cue type and reason): assist to cut up pancakes d/t difficulty noted                                   General ADL Comments: Attempted to engage in ADLs at sink, EOB and mobility though pt declined all efforts; agreeable for minimal tasks only due to low mood    Extremity/Trunk Assessment Upper Extremity Assessment Upper Extremity Assessment: Generalized weakness;Right hand dominant   Lower Extremity Assessment Lower Extremity Assessment: Defer to PT evaluation        Vision   Vision Assessment?: No apparent visual deficits   Perception     Praxis      Cognition Arousal: Alert Behavior During Therapy: Flat affect Overall Cognitive Status: No family/caregiver present to determine baseline cognitive functioning  General Comments: Pt is hard of hearing requires repetition and increase time to respond to questions and follow commands. minor confusion noted and poor motivation today - reports frustration over prolonged hospital stay and wanting to go home- max encouragement to complete minimal tasks. pt asked if it was AM or PM- oriented to AM and OT opened blinds but then pt quickly asked OT to put the blinds back down despite  education on benefits to maximize orientation        Exercises      Shoulder Instructions       General Comments      Pertinent Vitals/ Pain       Pain Assessment Pain Assessment: No/denies pain  Home Living                                          Prior Functioning/Environment              Frequency  Min 1X/week        Progress Toward Goals  OT Goals(current goals can now be found in the care plan section)  Progress towards OT goals: OT to reassess next treatment  Acute Rehab OT Goals Patient Stated Goal: go home OT Goal Formulation: With patient Time For Goal Achievement: 10/09/23 Potential to Achieve Goals: Good ADL Goals Pt Will Perform Upper Body Dressing: with contact guard assist;sitting Pt Will Perform Lower Body Dressing: with contact guard assist;sit to/from stand;sitting/lateral leans Pt Will Transfer to Toilet: with contact guard assist;ambulating;regular height toilet Pt Will Perform Tub/Shower Transfer: with contact guard assist;ambulating;shower seat;Tub transfer;Shower transfer  Plan      Co-evaluation                 AM-PAC OT "6 Clicks" Daily Activity     Outcome Measure   Help from another person eating meals?: None Help from another person taking care of personal grooming?: A Little Help from another person toileting, which includes using toliet, bedpan, or urinal?: A Lot Help from another person bathing (including washing, rinsing, drying)?: A Lot Help from another person to put on and taking off regular upper body clothing?: A Little Help from another person to put on and taking off regular lower body clothing?: A Lot 6 Click Score: 16    End of Session Equipment Utilized During Treatment: Rolling walker (2 wheels);Oxygen  OT Visit Diagnosis: Unsteadiness on feet (R26.81);Other abnormalities of gait and mobility (R26.89);Muscle weakness (generalized) (M62.81)   Activity Tolerance Patient limited by  fatigue   Patient Left in bed;with call bell/phone within reach;with bed alarm set   Nurse Communication Mobility status        Time: 0811-0825 OT Time Calculation (min): 14 min  Charges: OT General Charges $OT Visit: 1 Visit OT Treatments $Therapeutic Activity: 8-22 mins  Bradd Canary, OTR/L Acute Rehab Services Office: [phone]   Lorre Munroe 09/30/2023, 9:58 AM

## 2023-09-30 NOTE — Plan of Care (Signed)
  Problem: Clinical Measurements: Goal: Respiratory complications will improve Outcome: Progressing   Problem: Coping: Goal: Level of anxiety will decrease Outcome: Progressing   Problem: Safety: Goal: Ability to remain free from injury will improve Outcome: Progressing   

## 2023-09-30 NOTE — Progress Notes (Signed)
Progress Note   Patient: [name] WGN:[id number] DOB: 11-Sep-1940 DOA: 09/23/2023     6 DOS: the patient was seen and examined on 09/30/2023   Brief hospital course: 82/M with chronic combined CHF, EF 25-30%, CAD, paroxysmal A-fib, chronic anemia, CKD 3A, gout, chronic cholecystitis with cholecystostomy tube. He is followed by IR due to acute cholecystitis in October when he was deemed not a surgical candidate.  IR placed cholecystostomy tube.  Patient then underwent successful percutaneous gallstone removal and exchange of biliary drain on 1/14. -Subsequent to this noted more dyspnea on exertion and cough -In the ER mildly tachypneic, creatinine 1.03, WBC 10.3, BNP 456, troponin 25, respiratory virus panel negative for flu COVID and RSV, CXR noted cardiomegaly, moderate right pleural effusion, pulmonary vascular congestion. -Admitted started on diuretics -1/19, noted to be somnolent and drowsy, subsequently spiked a temp of 102,  -1/20, lethargic, positive for flu started on Tamiflu  Assessment and Plan: * Acute on chronic systolic CHF (congestive heart failure) (HCC) Echocardiogram with reduced LV systolic function with EF 25 to 30%, RV systolic function with moderate reduction, dyskinetic anterior septum, basal anterior segment, and basal infero septal segment. RV with mild reduction in systolic function, RVSP 52.6, LA with severe dilatation, RA with moderate dilatation,   Urine outout is 9,75 ml Systolic blood pressure 110 mmHg range.   Plan to continue torsemide, spironolactone and carvedilol.    Acute hypoxemic respiratory failure, acute cardiogenic pulmonary edema with right pleural effusion.  Right thoracentesis 1,1 L  Mainly lymphocytic with 73% out of 575 wbc  Hold on antibiotic therapy for now.   Wean to room air to keep 02 saturation 92% or greater,  Incentive spirometer   Influenza A Continue osetlamivir.  Supportive medical care.   Acute metabolic encephalopathy  and deconditioning, clinically improving.  Possible discharge home with home health services.   Essential hypertension, benign Continue blood pressure control with carvedilol.   Stage 3a chronic kidney disease (HCC) Hyponatremia.   Stable renal function with serum cr at 1,0 with K at 4,1  and serum bicarbonate at 29  Na 134  Follow up renal function in am.   Atrial fibrillation (HCC) Continue rate control with carvedilol.  Resume anticoagulation with apixaban,   Coronary artery disease No active chest pain.   Anemia Cell count has been stable.   Controlled type 2 diabetes mellitus without complication (HCC) Continue glucose cover and monitoring with insulin sliding scale.   Gout No acute flare.   Dyslipidemia Continue with statin therapy.   Acute cholecystitis Sp cholecystostomy tube.  Working appropriately with no signs of local infection  Follow up with IR recommendations.  Capping trial tolerating well.         Subjective: Patient is feeling better, dyspnea has improved, no chest pain or peripheral edema, no PND or orthopnea,    Physical Exam: Vitals:   09/30/23 0033 09/30/23 0359 09/30/23 0831 09/30/23 1017  BP: 118/66 114/74  117/69  Pulse: 76 93  90  Resp: 19 19  20   Temp: 98.6 F (37 C) 97.7 F (36.5 C)  98.3 F (36.8 C)  TempSrc: Oral Oral  Oral  SpO2: 99% 95% 94% 98%  Weight:  96.6 kg    Height:       Neurology awake and alert ENT with no pallor or icterus Cardiovascular with S1 and S2 present and regular with no gallops, rubs or murmurs No JVD Trace lower extremity edema Respiratory with no rales or wheezing, mild  decreased breath sounds at bases with poor inspiratory effort Abdomen is soft and non tender, biliary drain in place  Data Reviewed:    Family Communication: I spoke with patient's daughter at the bedside, we talked in detail about patient's condition, plan of care and prognosis and all questions were  addressed.   Disposition: Status is: Inpatient Remains inpatient appropriate because: possible discharge home tomorrow   Planned Discharge Destination: Home      Author: Coralie Keens, MD 09/30/2023 4:05 PM  For on call review [email or web address].

## 2023-09-30 NOTE — Plan of Care (Signed)

## 2023-10-01 ENCOUNTER — Other Ambulatory Visit (HOSPITAL_COMMUNITY): Payer: Self-pay

## 2023-10-01 DIAGNOSIS — J101 Influenza due to other identified influenza virus with other respiratory manifestations: Secondary | ICD-10-CM | POA: Diagnosis not present

## 2023-10-01 DIAGNOSIS — I5023 Acute on chronic systolic (congestive) heart failure: Secondary | ICD-10-CM | POA: Diagnosis not present

## 2023-10-01 DIAGNOSIS — N1831 Chronic kidney disease, stage 3a: Secondary | ICD-10-CM | POA: Diagnosis not present

## 2023-10-01 DIAGNOSIS — I10 Essential (primary) hypertension: Secondary | ICD-10-CM | POA: Diagnosis not present

## 2023-10-01 LAB — BASIC METABOLIC PANEL
Anion gap: 10 (ref 5–15)
BUN: 17 mg/dL (ref 8–23)
CO2: 28 mmol/L (ref 22–32)
Calcium: 8.2 mg/dL — ABNORMAL LOW (ref 8.9–10.3)
Chloride: 96 mmol/L — ABNORMAL LOW (ref 98–111)
Creatinine, Ser: 1.01 mg/dL (ref 0.61–1.24)
GFR, Estimated: >60 mL/min (ref >60)
Glucose, Bld: 106 mg/dL — ABNORMAL HIGH (ref 70–99)
Potassium: 3.8 mmol/L (ref 3.5–5.1)
Sodium: 134 mmol/L — ABNORMAL LOW (ref 135–145)

## 2023-10-01 LAB — BODY FLUID CULTURE W GRAM STAIN: Culture: NO GROWTH

## 2023-10-01 LAB — GLUCOSE, CAPILLARY
Glucose-Capillary: 111 mg/dL — ABNORMAL HIGH (ref 70–99)
Glucose-Capillary: 116 mg/dL — ABNORMAL HIGH (ref 70–99)

## 2023-10-01 LAB — CULTURE, BLOOD (ROUTINE X 2)
Culture: NO GROWTH
Culture: NO GROWTH
Special Requests: ADEQUATE

## 2023-10-01 LAB — MAGNESIUM: Magnesium: 1.8 mg/dL (ref 1.7–2.4)

## 2023-10-01 MED ORDER — SPIRONOLACTONE 25 MG PO TABS
25.0000 mg | ORAL_TABLET | Freq: Every day | ORAL | 0 refills | Status: AC
  Filled 2023-10-01: qty 30, 30d supply, fill #0

## 2023-10-01 MED ORDER — TORSEMIDE 20 MG PO TABS
20.0000 mg | ORAL_TABLET | Freq: Every day | ORAL | 0 refills | Status: AC
  Filled 2023-10-01: qty 30, 30d supply, fill #0

## 2023-10-01 MED ORDER — OSELTAMIVIR PHOSPHATE 75 MG PO CAPS
75.0000 mg | ORAL_CAPSULE | Freq: Two times a day (BID) | ORAL | 0 refills | Status: AC
  Filled 2023-10-01: qty 6, 3d supply, fill #0

## 2023-10-01 MED ORDER — POTASSIUM CHLORIDE CRYS ER 10 MEQ PO TBCR
10.0000 meq | EXTENDED_RELEASE_TABLET | Freq: Every day | ORAL | 0 refills | Status: DC
  Filled 2023-10-01: qty 30, 30d supply, fill #0

## 2023-10-01 NOTE — Progress Notes (Signed)
Mobility Specialist Progress Note:   10/01/23 0931  Mobility  Activity Ambulated with assistance in room  Level of Assistance Contact guard assist, steadying assist  Assistive Device None  Distance Ambulated (ft) 30 ft  Activity Response Tolerated fair  Mobility Referral Yes  Mobility visit 1 Mobility  Mobility Specialist Start Time (ACUTE ONLY) 0931  Mobility Specialist Stop Time (ACUTE ONLY) 0945  Mobility Specialist Time Calculation (min) (ACUTE ONLY) 14 min   Pt received ambulating in room with no AD use and no assistance. Stating he was "packing up to leave". Pt able to be redirected to bed, VSS on 1Lo2 throughout. Left with all needs met, alarm on.  Addison Lank Mobility Specialist Please contact via SecureChat or  Rehab office at [phone]

## 2023-10-01 NOTE — Discharge Summary (Signed)
Physician Discharge Summary   Patient: [name] MRN: [id number] DOB: 1941/02/08  Admit date:     09/23/2023  Discharge date: 10/01/23  Discharge Physician: York Ram Dal Blew   PCP: Kaleen Mask, MD   Recommendations at discharge:    Furosemide was changed from 40 mg every other day to daily torsemide 20 mg Added spironolactone, with possible addition of ARB and SGLT 2 inh as outpatient.  Continue Oseltamivr for 3 more days.  Follow up with interventional radiology in regards of biliary drain. Dr Elby Showers will evaluate possible drain removal. Follow up renal function and electrolytes in 7 days as outpatient.  Follow up with Dr Jeannetta Nap in 7 to 10 days  Follow up with Cardiology as scheduled.  I spoke with patient's daughter at the bedside, we talked in detail about patient's condition, plan of care and prognosis and all questions were addressed.   Discharge Diagnoses: Principal Problem:   Acute on chronic systolic CHF (congestive heart failure) (HCC) Active Problems:   Influenza A   Essential hypertension, benign   Stage 3a chronic kidney disease (HCC)   Atrial fibrillation (HCC)   Coronary artery disease   Anemia   Controlled type 2 diabetes mellitus without complication (HCC)   Gout   Dyslipidemia   Acute cholecystitis  Resolved Problems:   * No resolved hospital problems. [facility] Course: [name] was admitted to the hospital with the working diagnosis of heart failure decompensation, complicated with Influenza A infection.   83/M past medical history  CHF, EF 25-30%, CAD, paroxysmal A-fib, chronic anemia, CKD 3A, gout, chronic cholecystitis with cholecystostomy tube.  He is followed by IR due to acute cholecystitis in October when he was deemed not a surgical candidate.  IR placed cholecystostomy tube.   Patient then underwent successful percutaneous gallstone removal and exchange of biliary drain on 1/14. At home patient was noted to have worsening  dyspnea on exertion. On the day of admission he was note able to work with home PT due to dyspnea, and it was recommended him to go to the ED.  On his initial physical examination his blood pressure was 136/75, HR 77, RR 25 and 02 saturation 93%, lungs with rales bilaterally with no wheezing, heart with S1 and S2 present, irregularly irregular with no gallops, rubs or murmurs, abdomen with bilary drain in place, positive lower extremity edema.   Na 133, K 3,8 Cl 102 bicarbonate 21, glucose 117, bun 25 cr 1,0  BNP 456  High sensitive troponin 25 and 27  CRP 18,8  Procalcitonin 0,20  Wbc 10,3 hgb 12.4 plt 212   SARS covid 19 negative   Chest radiograph with cardiomegaly, bilateral hilar vascular congestion, right pleural effusion, fluid in the right fissure.   EKG 84 bpm, left axis deviation, left anterior fascicular block, left bundle branch block, atrial fibrillation rhythm with PVC, no significant ST segment or T wave changes.   -Admitted started on diuretics -1/19, noted to be somnolent and drowsy, subsequently spiked a temp of 102,  -1/20, lethargic, positive for Influenza A H1 2009, started on Tamiflu 01/24 clinically improved mentation and volume status.  Biliary drain capped per IR.  Patient will continue with home health services.   Assessment and Plan: * Acute on chronic systolic CHF (congestive heart failure) (HCC) Echocardiogram with reduced LV systolic function with EF 25 to 30%, RV systolic function with moderate reduction, dyskinetic anterior septum, basal anterior segment, and basal infero septal segment. RV with mild reduction  in systolic function, RVSP 52.6, LA with severe dilatation, RA with moderate dilatation,   Patient was placed on IV furosemide, negative fluid balance was achieved - 3,216 ml, with significant improvement in his symptoms.   Plan to continue torsemide, spironolactone and carvedilol.   Acute hypoxemic respiratory failure, acute cardiogenic pulmonary  edema with right pleural effusion.  Right thoracentesis 1,1 L  Mainly lymphocytic with 73% out of 575 wbc  Per Light's criteria an exudate.  Fluid culture with no growth no organisms,   Oxygenation has improved and at the time of his discharge his 02 saturation is 95% on room air.   Influenza A Patient was placed on osetlamivir with good toleration along with supportive medical care.   Acute metabolic encephalopathy resolved and deconditioning, clinically improving.  Patient will continue home health services. He has a walker at home.   Essential hypertension, benign Continue blood pressure control with carvedilol.   Stage 3a chronic kidney disease (HCC) Hyponatremia.   His renal function has improved, at the time of discharge his serum cr is 1,0 with K at 3,8 and serum bicarbonate at 28  Na 134  Mg 1,8   Plan to continue diuresis at home with torsemide, follow up renal function as outpatient.  Add Kcl supplementation.   Atrial fibrillation (HCC) Continue rate control with carvedilol.  Anticoagulation with apixaban,   Coronary artery disease No active chest pain.   Anemia Cell count has been stable, follow up as outpatient, Likely anemia of chronic medical diease.   Controlled type 2 diabetes mellitus without complication (HCC) Patient was placed on insulin sliding scale for glucose cover and monitoring.  His glucose remained stable.    Gout No acute flare.   Dyslipidemia Continue with statin therapy.   Acute cholecystitis Sp cholecystostomy tube.  Working appropriately with no signs of local infection  Follow up with IR recommendations.  Capping trial tolerating well.          Consultants: IR.  Procedures performed: right thoracentesis   Disposition: Home Diet recommendation:  Discharge Diet Orders (From admission, onward)     Start     Ordered   10/01/23 0000  Diet - low sodium heart healthy        10/01/23 1251           Cardiac and Carb  modified diet DISCHARGE MEDICATION: Allergies as of 10/01/2023       Reactions   Lipitor [atorvastatin] Other (See Comments)   MUSCLE ACHE    Jardiance [empagliflozin] Other (See Comments)   Intolerance        Medication List     STOP taking these medications    chlorpheniramine-HYDROcodone 10-8 MG/5ML Commonly known as: TUSSIONEX   doxycycline 100 MG capsule Commonly known as: VIBRAMYCIN   furosemide 40 MG tablet Commonly known as: LASIX   methylPREDNISolone 4 MG Tbpk tablet Commonly known as: MEDROL DOSEPAK       TAKE these medications    acetaminophen 325 MG tablet Commonly known as: TYLENOL Take 2 tablets (650 mg total) by mouth every 4 (four) hours as needed for headache or mild pain.   allopurinol 100 MG tablet Commonly known as: ZYLOPRIM Take 100 mg by mouth at bedtime.   BD PosiFlush 0.9 % Soln injection Generic drug: sodium chloride flush Flush gallbladder drain once daily with at least 5 ml normal saline. Discard extra 5ml after use of the 10ml syringe   carvedilol 3.125 MG tablet Commonly known as: COREG TAKE 1 TABLET  TWICE DAILY   Eliquis 5 MG Tabs tablet Generic drug: apixaban Take 5 mg by mouth 2 (two) times daily.   latanoprost 0.005 % ophthalmic solution Commonly known as: XALATAN Place 1 drop into both eyes at bedtime.   levothyroxine 125 MCG tablet Commonly known as: SYNTHROID Take 1 tablet (125 mcg total) by mouth daily before breakfast.   nitroGLYCERIN 0.4 MG SL tablet Commonly known as: NITROSTAT Place 1 tablet (0.4 mg total) under the tongue every 5 (five) minutes as needed for chest pain.   oseltamivir 75 MG capsule Commonly known as: TAMIFLU Take 1 capsule (75 mg total) by mouth 2 (two) times daily for 3 days.   potassium chloride 10 MEQ tablet Commonly known as: KLOR-CON M Take 1 tablet (10 mEq total) by mouth daily. What changed: when to take this   rosuvastatin 10 MG tablet Commonly known as: CRESTOR TAKE 1/2  TABLET EVERY DAY   spironolactone 25 MG tablet Commonly known as: ALDACTONE Take 1 tablet (25 mg total) by mouth daily. Start taking on: October 02, 2023   torsemide 20 MG tablet Commonly known as: DEMADEX Take 1 tablet (20 mg total) by mouth daily. Start taking on: October 02, 2023        Follow-up Information     [facility] Follow up.   Why: Please follow up with Dr. Elby Showers in approximately two weeks for a drain evaluation. A scheduler from our clinic will call you with a date/time of your appointment. Please call our office with any questions/concerns prior to your visit. Contact information: [address] [place] [address] [phone]                Discharge Exam: Filed Weights   09/29/23 0600 09/30/23 0359 10/01/23 0400  Weight: 99.2 kg 96.6 kg 97.6 kg   BP 105/60 (BP Location: Left Arm)   Pulse 75   Temp 98 F (36.7 C) (Oral)   Resp 17   Ht 5\' 10"  (1.778 m)   Wt 97.6 kg   SpO2 95%   BMI 30.87 kg/m   Patient with no chest pain or dyspnea, no abdominal pain, no PND, orthopnea or lower extremity edema  Neurology awake and alert ENT with mild pallor Cardiovascular with S1 and S2 present, irregularly irregular with no gallops, rubs or murmurs No JVD Trace lower extremity edema Respiratory with mild rales at bases with poor inspiratory effort with no wheezing or rhonchi Abdomen with no distention, non tender, biliary drain in place .  Condition at discharge: stable  The results of significant diagnostics from this hospitalization (including imaging, microbiology, ancillary and laboratory) are listed below for reference.   Imaging Studies: IR THORACENTESIS ASP PLEURAL SPACE W/IMG GUIDE Result Date: 09/28/2023 INDICATION: Patient with a history of heart failure and atrial fibrillation presents today with a right pleural effusion. Interventional radiology asked to perform a diagnostic and therapeutic thoracentesis. EXAM:  ULTRASOUND GUIDED THORACENTESIS MEDICATIONS: 1% lidocaine 10 mL COMPLICATIONS: None immediate. PROCEDURE: An ultrasound guided thoracentesis was thoroughly discussed with the patient and questions answered. The benefits, risks, alternatives and complications were also discussed. The patient understands and wishes to proceed with the procedure. Written consent was obtained. Ultrasound was performed to localize and mark an adequate pocket of fluid in the right chest. The area was then prepped and draped in the normal sterile fashion. 1% Lidocaine was used for local anesthesia. Under ultrasound guidance a 6 Fr Safe-T-Centesis catheter was introduced. Thoracentesis was performed. The  catheter was removed and a dressing applied. FINDINGS: A total of approximately 1.1 L of blood-tinged fluid was removed. Samples were sent to the laboratory as requested by the clinical team. IMPRESSION: Successful ultrasound guided right thoracentesis yielding 1.1 L of pleural fluid. Procedure performed by Alwyn Ren NP Electronically Signed   By: Marliss Coots M.D.   On: 09/28/2023 12:58   DG Chest 1 View Result Date: 09/28/2023 CLINICAL DATA:  Status post thoracentesis on the right. EXAM: CHEST  1 VIEW COMPARISON:  Radiographs 09/27/2023 and 09/25/2023. CT 05/24/2023 and 09/27/2023. FINDINGS: 1102 hours. Right pleural effusion has mildly decreased in volume. There is no evidence of pneumothorax. Residual right perihilar and bibasilar atelectasis. There is stable cardiac enlargement. The bones appear unchanged. IMPRESSION: Mild decrease in right pleural effusion following thoracentesis. No evidence of pneumothorax. Electronically Signed   By: Carey Bullocks M.D.   On: 09/28/2023 11:25   CT ABDOMEN PELVIS W CONTRAST Addendum Date: 09/27/2023 ADDENDUM REPORT: 09/27/2023 20:33 ADDENDUM: Correction on contrast amount. CONTRAST:  75 mL Omnipaque 350 IV Electronically Signed   By: Charlett Nose M.D.   On: 09/27/2023 20:33   Result  Date: 09/27/2023 CLINICAL DATA:  Sepsis EXAM: CT ABDOMEN AND PELVIS WITH CONTRAST TECHNIQUE: Multidetector CT imaging of the abdomen and pelvis was performed using the standard protocol following bolus administration of intravenous contrast. RADIATION DOSE REDUCTION: This exam was performed according to the departmental dose-optimization program which includes automated exposure control, adjustment of the mA and/or kV according to patient size and/or use of iterative reconstruction technique. CONTRAST:  100 mL Omnipaque 350 IV COMPARISON:  06/26/2023 FINDINGS: Lower chest: Moderate right pleural effusion. Compressive atelectasis in the right lower lobe. Cardiomegaly. Coronary artery and aortic atherosclerosis. Hepatobiliary: Cholecystostomy tube within the gallbladder which is decompressed, grossly unremarkable. No biliary ductal dilatation or focal hepatic abnormality. Pancreas: No focal abnormality or ductal dilatation. Spleen: No focal abnormality.  Normal size. Adrenals/Urinary Tract: No adrenal abnormality. No focal renal abnormality. No stones or hydronephrosis. Urinary bladder is unremarkable. Stomach/Bowel: Sigmoid diverticulosis. No active diverticulitis. Stomach and small bowel decompressed, unremarkable. Vascular/Lymphatic: Aortoiliac atherosclerosis. No evidence of aneurysm or adenopathy. Reproductive: No visible focal abnormality. Other: No free fluid or free air. Small left inguinal hernia containing fat. Musculoskeletal: No acute bony abnormality. IMPRESSION: Moderate right pleural effusion.  Right lower lobe atelectasis. Cholecystostomy drain in place with decompressed gallbladder. No acute findings in the abdomen or pelvis. Coronary artery disease, aortic atherosclerosis. Sigmoid diverticulosis. Electronically Signed: By: Charlett Nose M.D. On: 09/27/2023 20:29   DG CHEST PORT 1 VIEW Result Date: 09/27/2023 CLINICAL DATA:  Dyspnea and fever EXAM: PORTABLE CHEST 1 VIEW COMPARISON:  09/25/2023  FINDINGS: Stable cardiomegaly. Aortic atherosclerotic calcification. Coronary stenting. Patchy bilateral airspace opacities are similar to prior no definite pleural effusion. No pneumothorax. IMPRESSION: Patchy bilateral airspace opacities are similar to prior. Electronically Signed   By: Minerva Fester M.D.   On: 09/27/2023 02:09   DG CHEST PORT 1 VIEW Result Date: 09/25/2023 CLINICAL DATA:  Short of breath.  Cough. EXAM: PORTABLE CHEST 1 VIEW COMPARISON:  09/23/2023 and older exams.  CT, 05/24/2023. FINDINGS: Stable enlargement of the cardiac silhouette. Stable left coronary artery stent. Linear and hazy opacities are noted in the right mid lung with more confluent opacity at the right lung base. Left lung is essentially clear. No pneumothorax. IMPRESSION: 1. No significant change from the most recent prior study. 2. Right mid and lower lung opacity consistent with a combination pleural fluid and atelectasis  and/or pneumonia. Electronically Signed   By: Amie Portland M.D.   On: 09/25/2023 09:44   DG Chest 2 View Result Date: 09/23/2023 CLINICAL DATA:  Shortness of breath. EXAM: CHEST - 2 VIEW COMPARISON:  Radiograph 06/26/2023 FINDINGS: The heart is enlarged. Coronary stent visualized. Right pleural effusion, at least moderate in size. Bandlike opacity in the right mid lung may represent fluid in the fissure. Mild vascular congestion. No pneumothorax. Mild thoracic spondylosis. Drainage catheter in the upper abdomen is seen only on the lateral view. IMPRESSION: Cardiomegaly. Moderate pleural effusion with fluid in the right minor fissure. Vascular congestion. Recommend correlation for fluid overload. Electronically Signed   By: Narda Rutherford M.D.   On: 09/23/2023 16:56   IR REMOVAL OF CALCULI/DEBRIS BILIARY DUCT/GB Result Date: 09/21/2023 INDICATION: 83 year old male with a history of acute calculous cholecystitis status post percutaneous cholecystostomy 06/29/23. He is not a candidate for  cholecystectomy due to his cardiac issues. He is an excellent candidate for choledochoscopic guided percutaneous gallstone retrieval in hopes of eventual drain removal. EXAM: 1. Percutaneous cholangiogram. 2. Cholangioscopy. 3. Percutaneous gallstone retrieval. 4. Cholecystostomy tube exchange. MEDICATIONS: Cefoxitin, 2 g, intravenous; The antibiotic was administered within an appropriate time frame prior to the initiation of the procedure. ANESTHESIA/SEDATION: Moderate (conscious) sedation was employed during this procedure. A total of Versed 2.5 mg and Fentanyl 125 mcg was administered intravenously. Moderate Sedation Time: 70 minutes. The patient's level of consciousness and vital signs were monitored continuously by radiology nursing throughout the procedure under my direct supervision. FLUOROSCOPY TIME:  Eight hundred ten mGy COMPLICATIONS: None immediate. PROCEDURE: Informed written consent was obtained from the patient after a thorough discussion of the procedural risks, benefits and alternatives. All questions were addressed. Maximal Sterile Barrier Technique was utilized including caps, mask, sterile gowns, sterile gloves, sterile drape, hand hygiene and skin antiseptic. A timeout was performed prior to the initiation of the procedure. Scout radiograph right upper quadrant demonstrated unchanged position of indwelling cholecystostomy tube. Subdermal Local anesthesia was provided with % lidocaine the skin entry site of the indwelling drain. Large volume drain injection demonstrated a few mobile filling defects in body in infundibular compatible with cholelithiasis. The cystic common bile ducts appear patent without evidence of filling defect. The external portion of the drain was cut to release the inter pigtail. An Amplatz wire was inserted and the drain was removed. A 12 French, 45 cm braided sheath was then placed over the wire through which a 4 French angled tip Navicross catheter and Glidewire were used  to cannulate the cystic duct. Cannulation was successful in the proximal duct, however traversing distal portion the glidewire was unsuccessful. Therefore, the wire was exchanged for an 8018 inch glidewire which was able to successfully passed into the distal common bile duct. The catheter was advanced over the wire. The wire was then exchanged for the 035 glidewire which was directed into the duodenum. The catheter was directed over the wire. Hand injection of contrast demonstrated intraluminal position within the duodenum. Through the catheter, a V 18 the exchange wire was placed for safety purposes. The catheter was removed. An Amplatz wire was placed alongside the V18 wire in the 12 French sheath and coiled in the gallbladder lumen. The sheath was then removed and replaced over the Amplatz wire alone, excluding the safety V18 wire. The sheath was then replaced in the gallbladder body. The 12 French spyglass cholangioscope was then inserted and irrigation was performed to distend the gallbladder lumen. There were several small  mobile black gallstones visualized. Attempts were made at basket capture through the spyglass, however unsuccessful. Therefore, over an Amplatz wire, the 12 French sheath was exchanged after serial dilation for a 20 French peel-away sheath. Irrigation was performed of the gallbladder which yielded small volume of fragmented small black gallstones which were expelled via the sheath. The spyglass cope was reinserted and a few small stones remained. A which basket was then used for percutaneous gallstone retrieval with a few small black gallstones yielded. After copious irrigation, cholangioscopy was again performed which demonstrated no evidence of residual gallstones. Through the peel-away sheath, a new 14 [place] multipurpose drain was inserted with the pigtail portion coiled and locked in the gallbladder body. The drain was placed to bag drainage. The drain was affixed to the skin with a 0  silk suture. The V18 safety wire was then removed completion drain injection demonstrated optimal position gallbladder body with preserved patency of the cystic and common bile ducts appear a sterile bandage was applied. The patient tolerated the procedure well was transferred to the recovery area in good condition. IMPRESSION: 1. Multifocal small cholelithiasis. Patent cystic and common bile ducts. 2. Technically successful fluoroscopic and choledochoscopic guided percutaneous gallstone retrieval. 3. Technically successful percutaneous cholecystostomy tube replacement and upsized to 14 [place]. PLAN: Keep drain to bag drainage. Return to interventional radiology clinic in 2-3 weeks for drain injection to assess patency of cystic and common bile ducts. Marliss Coots, MD [facility] Electronically Signed   By: Marliss Coots M.D.   On: 09/21/2023 12:44    Microbiology: Results for orders placed or performed during the hospital encounter of 09/23/23  Resp panel by RT-PCR (RSV, Flu A&B, Covid) Anterior Nasal Swab     Status: None   Collection Time: 09/23/23  5:27 PM   Specimen: Anterior Nasal Swab  Result Value Ref Range Status   SARS Coronavirus 2 by RT PCR NEGATIVE NEGATIVE Final    Comment: (NOTE) SARS-CoV-2 target nucleic acids are NOT DETECTED.  The SARS-CoV-2 RNA is generally detectable in upper respiratory specimens during the acute phase of infection. The lowest concentration of SARS-CoV-2 viral copies this assay can detect is 138 copies/mL. A negative result does not preclude SARS-Cov-2 infection and should not be used as the sole basis for treatment or other patient management decisions. A negative result may occur with  improper specimen collection/handling, submission of specimen other than nasopharyngeal swab, presence of viral mutation(s) within the areas targeted by this assay, and inadequate number of viral copies(<138  copies/mL). A negative result must be combined with clinical observations, patient history, and epidemiological information. The expected result is Negative.  Fact Sheet for Patients:  BloggerCourse.com  Fact Sheet for Healthcare Providers:  SeriousBroker.it  This test is no t yet approved or cleared by the [place] FDA and  has been authorized for detection and/or diagnosis of SARS-CoV-2 by FDA under an Emergency Use Authorization (EUA). This EUA will remain  in effect (meaning this test can be used) for the duration of the COVID-19 declaration under Section 564(b)(1) of the Act, 21 U.S.C.section 360bbb-3(b)(1), unless the authorization is terminated  or revoked sooner.       Influenza A by PCR NEGATIVE NEGATIVE Final   Influenza B by PCR NEGATIVE NEGATIVE Final    Comment: (NOTE) The Xpert Xpress SARS-CoV-2/FLU/RSV plus assay is intended as an aid in the diagnosis of influenza from Nasopharyngeal swab specimens and should not be used as a sole basis  for treatment. Nasal washings and aspirates are unacceptable for Xpert Xpress SARS-CoV-2/FLU/RSV testing.  Fact Sheet for Patients: BloggerCourse.com  Fact Sheet for Healthcare Providers: SeriousBroker.it  This test is not yet approved or cleared by the [place] FDA and has been authorized for detection and/or diagnosis of SARS-CoV-2 by FDA under an Emergency Use Authorization (EUA). This EUA will remain in effect (meaning this test can be used) for the duration of the COVID-19 declaration under Section 564(b)(1) of the Act, 21 U.S.C. section 360bbb-3(b)(1), unless the authorization is terminated or revoked.     Resp Syncytial Virus by PCR NEGATIVE NEGATIVE Final    Comment: (NOTE) Fact Sheet for Patients: BloggerCourse.com  Fact Sheet for Healthcare  Providers: SeriousBroker.it  This test is not yet approved or cleared by the [place] FDA and has been authorized for detection and/or diagnosis of SARS-CoV-2 by FDA under an Emergency Use Authorization (EUA). This EUA will remain in effect (meaning this test can be used) for the duration of the COVID-19 declaration under Section 564(b)(1) of the Act, 21 U.S.C. section 360bbb-3(b)(1), unless the authorization is terminated or revoked.  Performed at [facility], [address], [place], [place] [address]   Urine Culture (for pregnant, neutropenic or urologic patients or patients with an indwelling urinary catheter)     Status: Abnormal   Collection Time: 09/26/23 12:02 PM   Specimen: Urine, Clean Catch  Result Value Ref Range Status   Specimen Description URINE, CLEAN CATCH  Final   Special Requests   Final    NONE Performed at [facility], [address] [place], [place] [address]    Culture 10,000 COLONIES/mL KLEBSIELLA AEROGENES (A)  Final   Report Status 09/29/2023 FINAL  Final   Organism ID, Bacteria KLEBSIELLA AEROGENES (A)  Final      Susceptibility   Klebsiella aerogenes - MIC*    CEFEPIME <=0.12 SENSITIVE Sensitive     CEFTRIAXONE 2 INTERMEDIATE Intermediate     CIPROFLOXACIN <=0.25 SENSITIVE Sensitive     GENTAMICIN <=1 SENSITIVE Sensitive     IMIPENEM 0.5 SENSITIVE Sensitive     NITROFURANTOIN 128 RESISTANT Resistant     TRIMETH/SULFA <=20 SENSITIVE Sensitive     PIP/TAZO >=128 RESISTANT Resistant ug/mL    * 10,000 COLONIES/mL KLEBSIELLA AEROGENES  Culture, blood (Routine X 2) w Reflex to ID Panel     Status: None   Collection Time: 09/26/23 12:52 PM   Specimen: BLOOD RIGHT ARM  Result Value Ref Range Status   Specimen Description BLOOD RIGHT ARM  Final   Special Requests   Final    BOTTLES DRAWN AEROBIC ONLY Blood Culture results may not be optimal due to an inadequate volume of blood received in culture  bottles   Culture   Final    NO GROWTH 5 DAYS Performed at [facility], [address] [place], [place] [address]    Report Status 10/01/2023 FINAL  Final  Culture, blood (Routine X 2) w Reflex to ID Panel     Status: None   Collection Time: 09/26/23 12:55 PM   Specimen: BLOOD LEFT ARM  Result Value Ref Range Status   Specimen Description BLOOD LEFT ARM  Final   Special Requests   Final    BOTTLES DRAWN AEROBIC AND ANAEROBIC Blood Culture adequate volume   Culture   Final    NO GROWTH 5 DAYS Performed at [facility], [address] [place], [place] [address]    Report  Status 10/01/2023 FINAL  Final  Respiratory (~20 pathogens) panel by PCR     Status: Abnormal   Collection Time: 09/27/23  9:12 AM   Specimen: Nasopharyngeal Swab; Respiratory  Result Value Ref Range Status   Adenovirus NOT DETECTED NOT DETECTED Final   Coronavirus 229E NOT DETECTED NOT DETECTED Final    Comment: (NOTE) The Coronavirus on the Respiratory Panel, DOES NOT test for the novel  Coronavirus (2019 nCoV)    Coronavirus HKU1 NOT DETECTED NOT DETECTED Final   Coronavirus NL63 NOT DETECTED NOT DETECTED Final   Coronavirus OC43 NOT DETECTED NOT DETECTED Final   Metapneumovirus NOT DETECTED NOT DETECTED Final   Rhinovirus / Enterovirus NOT DETECTED NOT DETECTED Final   Influenza A H1 2009 DETECTED (A) NOT DETECTED Final   Influenza B NOT DETECTED NOT DETECTED Final   Parainfluenza Virus 1 NOT DETECTED NOT DETECTED Final   Parainfluenza Virus 2 NOT DETECTED NOT DETECTED Final   Parainfluenza Virus 3 NOT DETECTED NOT DETECTED Final   Parainfluenza Virus 4 NOT DETECTED NOT DETECTED Final   Respiratory Syncytial Virus NOT DETECTED NOT DETECTED Final   Bordetella pertussis NOT DETECTED NOT DETECTED Final   Bordetella Parapertussis NOT DETECTED NOT DETECTED Final   Chlamydophila pneumoniae NOT DETECTED NOT DETECTED Final   Mycoplasma pneumoniae NOT DETECTED NOT DETECTED Final    Comment:  Performed at [facility], [address], [place], [place] [address]  Body fluid culture w Gram Stain     Status: None   Collection Time: 09/28/23 10:38 AM   Specimen: Lung, Right; Pleural Fluid  Result Value Ref Range Status   Specimen Description PLEURAL  Final   Special Requests right lung  Final   Gram Stain   Final    FEW WBC PRESENT, PREDOMINANTLY PMN NO ORGANISMS SEEN    Culture   Final    NO GROWTH 3 DAYS Performed at [facility], [address] [place], [place] [address]    Report Status 10/01/2023 FINAL  Final    Labs: CBC: Recent Labs  Lab 09/25/23 0220 09/27/23 0240 09/28/23 0243 09/30/23 0232  WBC 8.3 4.8 4.2 4.2  HGB 12.1* 11.5* 11.4* 11.6*  HCT 37.6* 35.8* 36.2* 35.6*  MCV 94.0 94.7 94.3 92.5  PLT 250 243 236 256   Basic Metabolic Panel: Recent Labs  Lab 09/24/23 1848 09/25/23 0220 09/26/23 0735 09/27/23 0240 09/28/23 0243 09/30/23 0232 10/01/23 0234  NA  --    < > 134* 133* 128* 134* 134*  K  --    < > 3.7 3.8 4.1 4.1 3.8  CL  --    < > 98 97* 95* 98 96*  CO2  --    < > 24 27 26 29 28   GLUCOSE  --    < > 119* 114* 111* 104* 106*  BUN  --    < > 22 24* 19 21 17   CREATININE  --    < > 1.24 1.23 0.96 1.02 1.01  CALCIUM  --    < > 8.5* 8.4* 8.2* 8.2* 8.2*  MG 2.1  --   --   --   --   --  1.8   < > = values in this interval not displayed.   Liver Function Tests: Recent Labs  Lab 09/25/23 0220 09/27/23 0240 09/28/23 0243 09/29/23 0228  AST 44* 41 40  --   ALT 27 24 22   --   ALKPHOS 111 94 93  --  BILITOT 0.9 0.8 0.7  --   PROT 6.0* 6.2* 6.2* 5.8*  ALBUMIN 2.4* 2.8* 2.7*  --    CBG: Recent Labs  Lab 09/30/23 1123 09/30/23 1630 09/30/23 2117 10/01/23 0620 10/01/23 1108  GLUCAP 225* 107* 131* 111* 116*    Discharge time spent: greater than 30 minutes.  Signed: Coralie Keens, MD [facility] 10/01/2023

## 2023-10-01 NOTE — Progress Notes (Signed)
Heart Failure Navigator Progress Note  Assessed for Heart & Vascular TOC clinic readiness.  Patient has a scheduled [facility] appointment on 10/20/27. Ok per Dr. Ella Jubilee.    Navigator will sign off at this time.   Rhae Hammock, BSN, Scientist, clinical (histocompatibility and immunogenetics) Only

## 2023-10-01 NOTE — Progress Notes (Signed)
Physical Therapy Treatment Patient Details Name: [name] MRN: [id number] DOB: 1941-04-03 Today's Date: 10/01/2023   History of Present Illness 83 y/o M presenting to ED on 1/16 with SOB, admitted for acute on chronic combined systolic and diastolic CHF. Pt being followed by IR for acute cholecysititis, biliary drain placed 1/14. Pt tested positive for flu 1/20 and was started on Tamiflu. Pt underwent US guided R thoracentesis 1/21. PMH includes paroxysmal A fib on Eliquis, CHF, CKD, dyslipidemia    PT Comments  Pt greeted seated EOB on RA. Pt performed STS using RW with supervision. Introduced stairs, pt ascended/descended 5 steps with B handrail, alternating pattern, and CGA. Pt ambulated with supervision using RW. Intermittent VC provided to increase pt's safety awareness with using an AD and ensure appropriate sequencing. Pt tolerated the session well, SpO2 maintained >90% on RA. Pt has displayed significant improvement with functional mobility. Recommend d/c Home with HHPT, increased family support initially, and use of RW for all mobility. Will continue to follow acutely and advance appropriately.    If plan is discharge home, recommend the following: Assistance with cooking/housework;Direct supervision/assist for medications management;Direct supervision/assist for financial management;Assist for transportation;Help with stairs or ramp for entrance;A little help with walking and/or transfers   Can travel by private vehicle     Yes  Equipment Recommendations  None recommended by PT (Pt has RW at home, recommend he uses it for all mobility.)    Recommendations for Other Services       Precautions / Restrictions Precautions Precautions: Fall Restrictions Weight Bearing Restrictions Per Provider Order: No     Mobility  Bed Mobility               General bed mobility comments: Did not assess this session. Pt seated EOB on arrival.    Transfers Overall transfer level:  Needs assistance Equipment used: Rolling walker (2 wheels) Transfers: Sit to/from Stand Sit to Stand: Supervision           General transfer comment: Cued pt to scoot fwd to edge and increase ant lean to assist in powering up. Pt able to stand from EOB and commode.    Ambulation/Gait Ambulation/Gait assistance: Contact guard assist, +2 safety/equipment Gait Distance (Feet): 300 Feet (1x300 from EOB to stairwell with 3 standing rest breaks; 1x20 from stairwell to recliner chair, pushed pt back to room d/t c/o urinary urgency. 1x5 from chair to bathroom; 1x15 from bathroom to recliner chair) Assistive device: Rolling walker (2 wheels) Gait Pattern/deviations: Decreased stride length, Trunk flexed, Step-to pattern, Wide base of support   Gait velocity interpretation: <1.31 ft/sec, indicative of household ambulator   General Gait Details: Pt inconsistently took RW off ground to navigate around obstacles. Instructed pt to keep all 4 points in contact with the ground at all times for safety. Pt maintains a WBOS, so his feet do not stay inside the AD and the AD is too far infront of him. Cued pt to bring RW closer and maintain LE inside the AD. Pt maintained flexed trunk with ambulation c/o back pain. Slow even steps observed. Pt unable to dual-task with ambulating.   Stairs Stairs: Yes Stairs assistance: Contact guard assist Stair Management: Two rails, Alternating pattern, Forwards Number of Stairs: 5 General stair comments: Ascending pt led with the RLE. VC/TC to facilitate pt turning around on staircase. Descending pt led with LLE.   Wheelchair Mobility     Tilt Bed    Modified Rankin (Stroke Patients Only)  Balance Overall balance assessment: Needs assistance Sitting-balance support: No upper extremity supported, Feet supported Sitting balance-Leahy Scale: Good     Standing balance support: Bilateral upper extremity supported, During functional activity Standing  balance-Leahy Scale: Fair Standing balance comment: Pt used RW for STS and ambulation. Pt attempted to park the AD to the side  when going to the bathroom and washing his hands. Educated pt on the importance of utilizing the AD for safety/stability and to stay inside the device with BUE support on grips.                            Cognition Arousal: Alert Behavior During Therapy: Flat affect Overall Cognitive Status: No family/caregiver present to determine baseline cognitive functioning                                 General Comments: Pt is hard of hearing requires repetition and increase time to respond to questions and follow commands. Pt A,Ox4. Minor confusion noted with pt initially denying he has steps then reporting he has 2STE.        Exercises      General Comments General comments (skin integrity, edema, etc.): Pt greeted on RA, SpO2 >90% with activity.      Pertinent Vitals/Pain Pain Assessment Pain Assessment: No/denies pain    Home Living                          Prior Function            PT Goals (current goals can now be found in the care plan section) Acute Rehab PT Goals Patient Stated Goal: Go Home Progress towards PT goals: Progressing toward goals    Frequency    Min 1X/week      PT Plan      Co-evaluation              AM-PAC PT "6 Clicks" Mobility   Outcome Measure  Help needed turning from your back to your side while in a flat bed without using bedrails?: A Little Help needed moving from lying on your back to sitting on the side of a flat bed without using bedrails?: A Little Help needed moving to and from a bed to a chair (including a wheelchair)?: A Little Help needed standing up from a chair using your arms (e.g., wheelchair or bedside chair)?: A Little Help needed to walk in hospital room?: A Little Help needed climbing 3-5 steps with a railing? : A Little 6 Click Score: 18    End of  Session Equipment Utilized During Treatment: Gait belt Activity Tolerance: Patient tolerated treatment well Patient left: in chair;with chair alarm set;with call bell/phone within reach Nurse Communication: Mobility status;Other (comment) (RN informed that pt tolerated session on RA.) PT Visit Diagnosis: Unsteadiness on feet (R26.81);Other abnormalities of gait and mobility (R26.89);Muscle weakness (generalized) (M62.81);Difficulty in walking, not elsewhere classified (R26.2)     Time: 8295-6213 PT Time Calculation (min) (ACUTE ONLY): 23 min  Charges:    $Gait Training: 23-37 mins                       Cheri Guppy, PT, DPT Acute Rehabilitation Services Office: [phone] Secure Chat Preferred    Richardson Chiquito 10/01/2023, 1:16 PM

## 2023-10-01 NOTE — TOC Transition Note (Signed)
Transition of Care [facility]) - Discharge Note   Patient Details  Name: [name] MRN: [id number] Date of Birth: 06/17/1941  [facility]) CM/SW Contact:  Leone Haven, RN Phone Number: 10/01/2023, 1:52 PM   Clinical Narrative:    For dc to day, NCM notified Kelly with [facility].  Daughter will transport home.      Barriers to Discharge: Continued Medical Work up, English as a second language teacher, SNF Pending bed offer   Patient Goals and CMS Choice Patient states their goals for this hospitalization and ongoing recovery are:: Unable to assess          Discharge Placement                       Discharge Plan and Services Additional resources added to the After Visit Summary for   In-house Referral: Clinical Social Work                                   Social Drivers of Health (SDOH) Interventions SDOH Screenings   Food Insecurity: No Food Insecurity (09/24/2023)  Housing: Low Risk  (09/24/2023)  Transportation Needs: No Transportation Needs (09/24/2023)  Utilities: Not At Risk (09/24/2023)  Depression (PHQ2-9): Low Risk  (10/14/2022)  Social Connections: Unknown (09/24/2023)  Tobacco Use: Low Risk  (09/21/2023)     Readmission Risk Interventions     No data to display

## 2023-10-04 ENCOUNTER — Telehealth: Payer: Self-pay | Admitting: Cardiovascular Disease

## 2023-10-04 NOTE — Telephone Encounter (Signed)
Caller Elita Quick) stated patient was discharged from hospital on Friday, 1/24.  Caller stated she saw patient on Saturday, 1/25 and wants to get plan of care for patient and orders for patient to have service once a week for 3 weeks and every other week for 1 week.  Caller noted patient service ends 2/21.

## 2023-10-05 NOTE — Telephone Encounter (Signed)
I spoke with Pam and she is aware we can not give the verbal order. Per provider orders were by [facility]. She verbalized understanding

## 2023-10-08 NOTE — Progress Notes (Signed)
Referring Physician(s): Emelia Loron   Chief Complaint: The patient is seen in follow up today s/p percutaneous gallstone retrieval 09/21/23  History of present illness: HPI from initial consultation 08/27/23 [name] is an 83 y.o. male with a medical history significant for CAD, CHF, MI s/p PCI, atrial fibrillation on Eliquis, DM, chronic kidney disease, HTN and V fib with ICD (s/p removal 06/01/23 at outside hospital due to infection. He presented to the [facility] ED 06/26/23 with abdominal pain and was found to have acute calculous cholecystitis. He was not considered a candidate for surgery due to his cardiology status and IR was consulted for a percutaneous cholecystostomy. The drain was placed 06/29/23 and he was discharged home 07/01/23. He followed up with IR 08/11/23 for a cholangiogram with drain exchange. This study showed patent appearing cystic and common bile ducts with a few small filling defects in the gallbladder body suggestive of cholelithiasis.    He followed up with Dr. Dwain Sarna 08/12/23 and they discussed potential surgery pending cardiac clearance.  A left heart catheterization with angiography was performed 08/20/23 and his perioperative cardiac risk was considered elevated because of ischemic cardiomyopathy with an EF of 25-30% on recent echocardiogram.   The patient has been kindly referred to Interventional Radiology to discuss percutaneous gallstone retrieval. The patient is very motivated to get the drain removed as it causes him significant discomfort.  No recent fevers or chills.  Normal bilious drainage into bag with exception of some trace blood seen after recent cardiac cath.  We discussed the rationale, periprocedural expectations, risks, benefits, and long term success rate of getting the drain out with percutaneous gallstone retrieval. He wished to proceed and this was performed 09/21/23. I was able to remove a few small, black gallstones and the  patient tolerated the procedure well. He was discharged home the same day with plans to evaluate the drain in a few weeks.  He presented to the ED 09/23/23 with shortness of breath and he was admitted with a heart failure exacerbation with acute respiratory failure. He was seen in IR 09/28/23 for a right thoracentesis with 1.1 liter of fluid removed and he was discharged home 10/01/23. Prior to discharge his cholecystostomy was capped on 09/29/23.   He presents today for a drain evaluation.  He is very uncomfortable from the drain being in place.  He has otherwise tolerated capping since 1/22. No fevers, chills, nausea, vomiting.  He is still recovering after his recent hospitalization.  Past Medical History:  Diagnosis Date   AICD (automatic cardioverter/defibrillator) present    Anginal pain (HCC)    Arthritis    knees, back    BPH (benign prostatic hypertrophy)    CHF (congestive heart failure) (HCC)    Chronic kidney disease    BPH   Chronic systolic heart failure (HCC)    a. s/p MDT single chamber ICD 2004 as part of MASTER study b. upgrade to CRTD 2010; CRTD gen change 2016   Coronary artery disease    a. s/p anterior MI and CYPHER stent to LAD 2005   Dyslipidemia    Dyspnea    Dysrhythmia    a-fib   Gout    Ischemic cardiomyopathy    Paroxysmal atrial fibrillation (HCC)    Ventricular tachycardia (HCC)     Past Surgical History:  Procedure Laterality Date   ANAL RECTAL MANOMETRY N/A 06/23/2023   Procedure: ANO RECTAL MANOMETRY;  Surgeon: Napoleon Form, MD;  Location: [facility] ENDOSCOPY;  Service: Gastroenterology;  Laterality: N/A;   APPENDECTOMY  1970   BI-VENTRICULAR IMPLANTABLE CARDIOVERTER DEFIBRILLATOR UPGRADE N/A 11/07/2014   a. MDT single chamber ICD implanted 2004 as part of MASTER study; upgrade to CRTD 2010; gen change 2016   BIV ICD GENERATOR CHANGEOUT N/A 11/12/2021   Procedure: BIV ICD GENERATOR CHANGEOUT;  Surgeon: Duke Salvia, MD;  Location: [facility] INVASIVE CV  LAB;  Service: Cardiovascular;  Laterality: N/A;   CARDIAC CATHETERIZATION  09/08/2003   x3 total of 5 stents   CARDIOVERSION N/A 07/04/2019   Procedure: CARDIOVERSION;  Surgeon: Wendall Stade, MD;  Location: [facility] ENDOSCOPY;  Service: Cardiovascular;  Laterality: N/A;   CARDIOVERSION N/A 08/17/2019   Procedure: CARDIOVERSION;  Surgeon: Vesta Mixer, MD;  Location: [facility] ENDOSCOPY;  Service: Cardiovascular;  Laterality: N/A;   CARDIOVERSION N/A 05/04/2022   Procedure: CARDIOVERSION;  Surgeon: Quintella Reichert, MD;  Location: [facility] ENDOSCOPY;  Service: Cardiovascular;  Laterality: N/A;   IR EXCHANGE BILIARY DRAIN  08/11/2023   IR PERC CHOLECYSTOSTOMY  06/29/2023   IR RADIOLOGIST EVAL & MGMT  08/27/2023   IR REMOVAL OF CALCULI/DEBRIS BILIARY DUCT/GB  09/21/2023   IR THORACENTESIS ASP PLEURAL SPACE W/IMG GUIDE  09/28/2023   LEFT HEART CATH AND CORONARY ANGIOGRAPHY N/A 08/20/2023   Procedure: LEFT HEART CATH AND CORONARY ANGIOGRAPHY;  Surgeon: Elder Negus, MD;  Location: [facility] INVASIVE CV LAB;  Service: Cardiovascular;  Laterality: N/A;   PROSTATECTOMY  11/06/2011   Procedure: PROSTATECTOMY SUPRAPUBIC;  Surgeon: Kathi Ludwig, MD;  Location: [facility] ORS;  Service: Urology;  Laterality: N/A;  Open Suprapubic Prostatectomy   TEE WITHOUT CARDIOVERSION N/A 05/16/2019   Procedure: TRANSESOPHAGEAL ECHOCARDIOGRAM (TEE);  Surgeon: Sande Rives, MD;  Location: [facility] ENDOSCOPY;  Service: Cardiology;  Laterality: N/A;   TOTAL KNEE ARTHROPLASTY Left 10/06/2021   Procedure: TOTAL KNEE ARTHROPLASTY;  Surgeon: Jodi Geralds, MD;  Location: [facility] ORS;  Service: Orthopedics;  Laterality: Left;    Allergies: Lipitor [atorvastatin] and Jardiance [empagliflozin]  Medications: Prior to Admission medications   Medication Sig Start Date End Date Taking? Authorizing Provider  acetaminophen (TYLENOL) 325 MG tablet Take 2 tablets (650 mg total) by mouth every 4 (four) hours as needed for headache or mild pain.  05/27/23   Sheilah Pigeon, PA-C  allopurinol (ZYLOPRIM) 100 MG tablet Take 100 mg by mouth at bedtime. 05/14/15   [provider]  apixaban (ELIQUIS) 5 MG TABS tablet Take 5 mg by mouth 2 (two) times daily.    [provider]  carvedilol (COREG) 3.125 MG tablet TAKE 1 TABLET TWICE DAILY 07/29/23   Nahser, Deloris Ping, MD  latanoprost (XALATAN) 0.005 % ophthalmic solution Place 1 drop into both eyes at bedtime.  10/03/13   [provider]  levothyroxine (SYNTHROID) 125 MCG tablet Take 1 tablet (125 mcg total) by mouth daily before breakfast. 08/03/23   Nahser, Deloris Ping, MD  nitroGLYCERIN (NITROSTAT) 0.4 MG SL tablet Place 1 tablet (0.4 mg total) under the tongue every 5 (five) minutes as needed for chest pain. 12/12/19   Nahser, Deloris Ping, MD  potassium chloride (KLOR-CON M) 10 MEQ tablet Take 1 tablet (10 mEq total) by mouth daily. 10/01/23 10/31/23  Arrien, York Ram, MD  rosuvastatin (CRESTOR) 10 MG tablet TAKE 1/2 TABLET EVERY DAY 03/24/23   Nahser, Deloris Ping, MD  sodium chloride flush (NS) 0.9 % SOLN Flush gallbladder drain once daily with at least 5 ml normal saline. Discard extra 5ml after use of the 10ml syringe 06/30/23  Alwyn Ren R, NP  spironolactone (ALDACTONE) 25 MG tablet Take 1 tablet (25 mg total) by mouth daily. 10/02/23 11/01/23  Arrien, York Ram, MD  torsemide (DEMADEX) 20 MG tablet Take 1 tablet (20 mg total) by mouth daily. 10/02/23 11/01/23  Arrien, York Ram, MD     Family History  Problem Relation Age of Onset   Heart disease Sister        82 78f the 4 had HD   Heart disease Brother        2 of 3 had HD   Heart disease Sister        1 of the 6 has HD   Heart disease Brother     Social History   Socioeconomic History   Marital status: Married    Spouse name: Not on file   Number of children: Not on file   Years of education: Not on file   Highest education level: Not on file  Occupational History   Not on file  Tobacco Use    Smoking status: Never    Passive exposure: Never   Smokeless tobacco: Never  Vaping Use   Vaping status: Never Used  Substance and Sexual Activity   Alcohol use: No    Comment: occasional beer    Drug use: No   Sexual activity: Not on file  Other Topics Concern   Not on file  Social History Narrative   Not on file   Social Drivers of Health   Financial Resource Strain: Not on file  Food Insecurity: No Food Insecurity (09/24/2023)   Hunger Vital Sign    Worried About Running Out of Food in the Last Year: Never true    Ran Out of Food in the Last Year: Never true  Transportation Needs: No Transportation Needs (09/24/2023)   PRAPARE - Administrator, Civil Service (Medical): No    Lack of Transportation (Non-Medical): No  Physical Activity: Not on file  Stress: Not on file  Social Connections: Unknown (09/24/2023)   Social Connection and Isolation Panel [NHANES]    Frequency of Communication with Friends and Family: Never    Frequency of Social Gatherings with Friends and Family: Never    Attends Religious Services: Not on Marketing executive or Organizations: Not on file    Attends Banker Meetings: Never    Marital Status: Patient declined     Vital Signs: There were no vitals taken for this visit.  Physical Exam Constitutional:      General: He is not in acute distress. HENT:     Head: Normocephalic.     Mouth/Throat:     Mouth: Mucous membranes are moist.  Eyes:     General: No scleral icterus. Cardiovascular:     Rate and Rhythm: Normal rate and regular rhythm.  Pulmonary:     Effort: Pulmonary effort is normal.  Abdominal:     General: There is no distension.     Comments: RUQ cholecystostomy tube in place, capped.  Skin:    General: Skin is warm and dry.     Coloration: Skin is not jaundiced.  Neurological:     Mental Status: He is alert and oriented to person, place, and time.      Imaging: 08/11/23   Labs:  CBC: Recent Labs    09/25/23 0220 09/27/23 0240 09/28/23 0243 09/30/23 0232  WBC 8.3 4.8 4.2 4.2  HGB 12.1* 11.5* 11.4* 11.6*  HCT 37.6*  35.8* 36.2* 35.6*  PLT 250 243 236 256    COAGS: Recent Labs    06/27/23 0919 06/27/23 1904 06/28/23 0828 06/28/23 1811 06/28/23 2349 09/21/23 0840  INR  --   --   --   --  1.2 1.3*  APTT 40* 137* 152* 78*  --   --     BMP: Recent Labs    09/27/23 0240 09/28/23 0243 09/30/23 0232 10/01/23 0234  NA 133* 128* 134* 134*  K 3.8 4.1 4.1 3.8  CL 97* 95* 98 96*  CO2 27 26 29 28   GLUCOSE 114* 111* 104* 106*  BUN 24* 19 21 17   CALCIUM 8.4* 8.2* 8.2* 8.2*  CREATININE 1.23 0.96 1.02 1.01  GFRNONAA 58* >60 >60 >60    LIVER FUNCTION TESTS: Recent Labs    09/21/23 0840 09/25/23 0220 09/27/23 0240 09/28/23 0243 09/29/23 0228  BILITOT 2.3* 0.9 0.8 0.7  --   AST 19 44* 41 40  --   ALT 12 27 24 22   --   ALKPHOS 80 111 94 93  --   PROT 7.2 6.0* 6.2* 6.2* 5.8*  ALBUMIN 3.7 2.4* 2.8* 2.7*  --     Assessment and Plan:  83 year old male with a history of acute calculous cholecystitis status post percutaneous cholecystostomy 06/29/23. He underwent a technically successful percutaneous gallstone retrieval 09/21/23 and his drain was capped 09/29/23 during his recent hospitalization for CHF exacerbation.  He has tolerated capping trial.  Today I am unable to visualized the cystic duct after injection.  I believe it may be premature to remove the drain at this time.  Plan for 2 week follow up drain check and hopeful removal if he continues to tolerate capping.  Marliss Coots, MD Pager: [phone]    I spent a total of 25 Minutes in face to face in clinical consultation, greater than 50% of which was counseling/coordinating care for cholelithiasis.

## 2023-10-11 ENCOUNTER — Ambulatory Visit
Admission: RE | Admit: 2023-10-11 | Discharge: 2023-10-11 | Disposition: A | Discharge status: Home / Self Care | Payer: Medicare PPO | Source: Ambulatory Visit | Attending: Interventional Radiology | Admitting: Interventional Radiology

## 2023-10-11 ENCOUNTER — Other Ambulatory Visit: Payer: Self-pay | Admitting: Interventional Radiology

## 2023-10-11 DIAGNOSIS — K810 Acute cholecystitis: Secondary | ICD-10-CM

## 2023-10-11 DIAGNOSIS — K819 Cholecystitis, unspecified: Secondary | ICD-10-CM

## 2023-10-11 HISTORY — PX: IR RADIOLOGIST EVAL & MGMT: IMG5224

## 2023-10-11 MED ORDER — IOHEXOL 300 MG/ML  SOLN
20.0000 mL | Freq: Once | INTRAMUSCULAR | Status: AC | PRN
  Administered 2023-10-11: 20 mL

## 2023-10-15 ENCOUNTER — Other Ambulatory Visit: Payer: Medicare PPO

## 2023-10-19 ENCOUNTER — Encounter: Payer: Self-pay | Admitting: Cardiovascular Disease
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
## 2023-10-20 ENCOUNTER — Ambulatory Visit: Payer: Medicare PPO | Attending: Cardiovascular Disease | Admitting: Cardiovascular Disease

## 2023-10-20 ENCOUNTER — Encounter: Payer: Self-pay | Admitting: Cardiovascular Disease

## 2023-10-20 VITALS — BP 110/60 | HR 86 | Ht 70.0 in | Wt 205.8 lb

## 2023-10-20 DIAGNOSIS — I4821 Permanent atrial fibrillation: Secondary | ICD-10-CM | POA: Diagnosis not present

## 2023-10-20 NOTE — Patient Instructions (Signed)
Medication Instructions:  DECREASE Torsemide and Potassium to 6 days a week *If you need a refill on your cardiac medications before your next appointment, please call your pharmacy*  Follow-Up: At [facility], you and your health needs are our priority.  As part of our continuing mission to provide you with exceptional heart care, we have created designated Provider Care Teams.  These Care Teams include your primary Cardiologist (physician) and Advanced Practice Providers (APPs -  Physician Assistants and Nurse Practitioners) who all work together to provide you with the care you need, when you need it.  Your next appointment:   As Scheduled  Provider:   Kristeen Miss, MD      1st Floor: - Lobby - Registration  - Pharmacy  - Lab - Cafe  2nd Floor: - PV Lab - Diagnostic Testing (echo, CT, nuclear med)  3rd Floor: - Vacant  4th Floor: - TCTS (cardiothoracic surgery) - AFib Clinic - Structural Heart Clinic - Vascular Surgery  - Vascular Ultrasound  5th Floor: - HeartCare Cardiology (general and EP) - Clinical Pharmacy for coumadin, hypertension, lipid, weight-loss medications, and med management appointments    Valet parking services will be available as well.

## 2023-10-22 NOTE — Progress Notes (Signed)
Referring Physician(s): Dr. Dwain Sarna  Chief Complaint: The patient is seen in follow up today s/p percutaneous gallstone retrieval 09/21/23   History of present illness: HPI from last clinic visit 10/11/23 [name] is an 83 y.o. male with a medical history significant for CAD, CHF, MI s/p PCI, atrial fibrillation on Eliquis, DM, chronic kidney disease, HTN and V fib with ICD (s/p removal 06/01/23 at outside hospital due to infection. He presented to the [facility] ED 06/26/23 with abdominal pain and was found to have acute calculous cholecystitis. He was not considered a candidate for surgery due to his cardiology status and IR was consulted for a percutaneous cholecystostomy. The drain was placed 06/29/23 and he was discharged home 07/01/23. He followed up with IR 08/11/23 for a cholangiogram with drain exchange. This study showed patent appearing cystic and common bile ducts with a few small filling defects in the gallbladder body suggestive of cholelithiasis.    He followed up with Dr. Dwain Sarna 08/12/23 and they discussed potential surgery pending cardiac clearance.  A left heart catheterization with angiography was performed 08/20/23 and his perioperative cardiac risk was considered elevated because of ischemic cardiomyopathy with an EF of 25-30% on recent echocardiogram.   The patient has been kindly referred to Interventional Radiology to discuss percutaneous gallstone retrieval. The patient is very motivated to get the drain removed as it causes him significant discomfort.  No recent fevers or chills.  Normal bilious drainage into bag with exception of some trace blood seen after recent cardiac cath.   We discussed the rationale, periprocedural expectations, risks, benefits, and long term success rate of getting the drain out with percutaneous gallstone retrieval. He wished to proceed and this was performed 09/21/23. I was able to remove a few small, black gallstones and the patient  tolerated the procedure well. He was discharged home the same day with plans to evaluate the drain in a few weeks.   He presented to the ED 09/23/23 with shortness of breath and he was admitted with a heart failure exacerbation with acute respiratory failure. He was seen in IR 09/28/23 for a right thoracentesis with 1.1 liter of fluid removed and he was discharged home 10/01/23. Prior to discharge his cholecystostomy was capped on 09/29/23.    He presents today for a drain evaluation.  He is very uncomfortable from the drain being in place.  He has otherwise tolerated capping since 1/22. No fevers, chills, nausea, vomiting.  He is still recovering after his recent hospitalization.  Unfortunately I was unable to visualize the cystic duct during the cholangiogram and I was hesitant to remove the drain. We discussed repeating the study in two weeks. The drain was left capped. He presents again today to the outpatient clinic for a drain check and possible drain removal.   Past Medical History:  Diagnosis Date   AICD (automatic cardioverter/defibrillator) present    Anginal pain (HCC)    Arthritis    knees, back    BPH (benign prostatic hypertrophy)    CHF (congestive heart failure) (HCC)    Chronic kidney disease    BPH   Chronic systolic heart failure (HCC)    a. s/p MDT single chamber ICD 2004 as part of MASTER study b. upgrade to CRTD 2010; CRTD gen change 2016   Coronary artery disease    a. s/p anterior MI and CYPHER stent to LAD 2005   Dyslipidemia    Dyspnea    Dysrhythmia    a-fib  Gout    Ischemic cardiomyopathy    Paroxysmal atrial fibrillation (HCC)    Ventricular tachycardia (HCC)     Past Surgical History:  Procedure Laterality Date   ANAL RECTAL MANOMETRY N/A 06/23/2023   Procedure: ANO RECTAL MANOMETRY;  Surgeon: Napoleon Form, MD;  Location: [facility] ENDOSCOPY;  Service: Gastroenterology;  Laterality: N/A;   APPENDECTOMY  1970   BI-VENTRICULAR IMPLANTABLE CARDIOVERTER  DEFIBRILLATOR UPGRADE N/A 11/07/2014   a. MDT single chamber ICD implanted 2004 as part of MASTER study; upgrade to CRTD 2010; gen change 2016   BIV ICD GENERATOR CHANGEOUT N/A 11/12/2021   Procedure: BIV ICD GENERATOR CHANGEOUT;  Surgeon: Duke Salvia, MD;  Location: [facility] INVASIVE CV LAB;  Service: Cardiovascular;  Laterality: N/A;   CARDIAC CATHETERIZATION  09/08/2003   x3 total of 5 stents   CARDIOVERSION N/A 07/04/2019   Procedure: CARDIOVERSION;  Surgeon: Wendall Stade, MD;  Location: [facility] ENDOSCOPY;  Service: Cardiovascular;  Laterality: N/A;   CARDIOVERSION N/A 08/17/2019   Procedure: CARDIOVERSION;  Surgeon: Vesta Mixer, MD;  Location: [facility] ENDOSCOPY;  Service: Cardiovascular;  Laterality: N/A;   CARDIOVERSION N/A 05/04/2022   Procedure: CARDIOVERSION;  Surgeon: Quintella Reichert, MD;  Location: [facility] ENDOSCOPY;  Service: Cardiovascular;  Laterality: N/A;   IR EXCHANGE BILIARY DRAIN  08/11/2023   IR PERC CHOLECYSTOSTOMY  06/29/2023   IR RADIOLOGIST EVAL & MGMT  08/27/2023   IR RADIOLOGIST EVAL & MGMT  10/11/2023   IR REMOVAL OF CALCULI/DEBRIS BILIARY DUCT/GB  09/21/2023   IR THORACENTESIS ASP PLEURAL SPACE W/IMG GUIDE  09/28/2023   LEFT HEART CATH AND CORONARY ANGIOGRAPHY N/A 08/20/2023   Procedure: LEFT HEART CATH AND CORONARY ANGIOGRAPHY;  Surgeon: Elder Negus, MD;  Location: [facility] INVASIVE CV LAB;  Service: Cardiovascular;  Laterality: N/A;   PROSTATECTOMY  11/06/2011   Procedure: PROSTATECTOMY SUPRAPUBIC;  Surgeon: Kathi Ludwig, MD;  Location: [facility] ORS;  Service: Urology;  Laterality: N/A;  Open Suprapubic Prostatectomy   TEE WITHOUT CARDIOVERSION N/A 05/16/2019   Procedure: TRANSESOPHAGEAL ECHOCARDIOGRAM (TEE);  Surgeon: Sande Rives, MD;  Location: [name] ENDOSCOPY;  Service: Cardiology;  Laterality: N/A;   TOTAL KNEE ARTHROPLASTY Left 10/06/2021   Procedure: TOTAL KNEE ARTHROPLASTY;  Surgeon: Jodi Geralds, MD;  Location: [facility] ORS;  Service: Orthopedics;  Laterality: Left;     Allergies: Lipitor [atorvastatin] and Jardiance [empagliflozin]  Medications: Prior to Admission medications   Medication Sig Start Date End Date Taking? Authorizing Provider  acetaminophen (TYLENOL) 325 MG tablet Take 2 tablets (650 mg total) by mouth every 4 (four) hours as needed for headache or mild pain. 05/27/23   Sheilah Pigeon, PA-C  allopurinol (ZYLOPRIM) 100 MG tablet Take 100 mg by mouth at bedtime. 05/14/15   [provider]  apixaban (ELIQUIS) 5 MG TABS tablet Take 5 mg by mouth 2 (two) times daily.    [provider]  carvedilol (COREG) 3.125 MG tablet TAKE 1 TABLET TWICE DAILY 07/29/23   Nahser, Deloris Ping, MD  latanoprost (XALATAN) 0.005 % ophthalmic solution Place 1 drop into both eyes at bedtime.  10/03/13   [provider]  levothyroxine (SYNTHROID) 125 MCG tablet Take 1 tablet (125 mcg total) by mouth daily before breakfast. 08/03/23   Nahser, Deloris Ping, MD  nitroGLYCERIN (NITROSTAT) 0.4 MG SL tablet Place 1 tablet (0.4 mg total) under the tongue every 5 (five) minutes as needed for chest pain. 12/12/19   Nahser, Deloris Ping, MD  potassium chloride (KLOR-CON M) 10 MEQ tablet Take 1  tablet (10 mEq total) by mouth daily. 10/01/23 10/31/23  Arrien, York Ram, MD  rosuvastatin (CRESTOR) 10 MG tablet TAKE 1/2 TABLET EVERY DAY 03/24/23   Nahser, Deloris Ping, MD  spironolactone (ALDACTONE) 25 MG tablet Take 1 tablet (25 mg total) by mouth daily. 10/02/23 11/01/23  Arrien, York Ram, MD  torsemide (DEMADEX) 20 MG tablet Take 1 tablet (20 mg total) by mouth daily. 10/02/23 11/01/23  Arrien, York Ram, MD     Family History  Problem Relation Age of Onset   Heart disease Sister        41 32f the 4 had HD   Heart disease Brother        2 of 3 had HD   Heart disease Sister        1 of the 6 has HD   Heart disease Brother     Social History   Socioeconomic History   Marital status: Married    Spouse name: Not on file   Number of children: Not on  file   Years of education: Not on file   Highest education level: Not on file  Occupational History   Not on file  Tobacco Use   Smoking status: Never    Passive exposure: Never   Smokeless tobacco: Never  Vaping Use   Vaping status: Never Used  Substance and Sexual Activity   Alcohol use: No    Comment: occasional beer    Drug use: No   Sexual activity: Not on file  Other Topics Concern   Not on file  Social History Narrative   Not on file   Social Drivers of Health   Financial Resource Strain: Not on file  Food Insecurity: No Food Insecurity (09/24/2023)   Hunger Vital Sign    Worried About Running Out of Food in the Last Year: Never true    Ran Out of Food in the Last Year: Never true  Transportation Needs: No Transportation Needs (09/24/2023)   PRAPARE - Administrator, Civil Service (Medical): No    Lack of Transportation (Non-Medical): No  Physical Activity: Not on file  Stress: Not on file  Social Connections: Unknown (09/24/2023)   Social Connection and Isolation Panel [NHANES]    Frequency of Communication with Friends and Family: Never    Frequency of Social Gatherings with Friends and Family: Never    Attends Religious Services: Not on Marketing executive or Organizations: Not on file    Attends Banker Meetings: Never    Marital Status: Patient declined     Vital Signs: There were no vitals taken for this visit.  Physical Exam HENT:     Head: Normocephalic.     Mouth/Throat:     Mouth: Mucous membranes are moist.  Eyes:     General: No scleral icterus. Cardiovascular:     Rate and Rhythm: Normal rate and regular rhythm.  Pulmonary:     Effort: No respiratory distress.  Abdominal:     General: There is no distension.     Comments: RUQ drain in place, capped.  Mild surrounding erythema.  Skin:    General: Skin is warm and dry.     Coloration: Skin is not jaundiced.  Neurological:     Mental Status: He is alert  and oriented to person, place, and time.     Imaging: 08/11/23   10/11/23   10/25/23     Labs:  CBC: Recent Labs  09/25/23 0220 09/27/23 0240 09/28/23 0243 09/30/23 0232  WBC 8.3 4.8 4.2 4.2  HGB 12.1* 11.5* 11.4* 11.6*  HCT 37.6* 35.8* 36.2* 35.6*  PLT 250 243 236 256    COAGS: Recent Labs    06/27/23 0919 06/27/23 1904 06/28/23 0828 06/28/23 1811 06/28/23 2349 09/21/23 0840  INR  --   --   --   --  1.2 1.3*  APTT 40* 137* 152* 78*  --   --     BMP: Recent Labs    09/27/23 0240 09/28/23 0243 09/30/23 0232 10/01/23 0234  NA 133* 128* 134* 134*  K 3.8 4.1 4.1 3.8  CL 97* 95* 98 96*  CO2 27 26 29 28   GLUCOSE 114* 111* 104* 106*  BUN 24* 19 21 17   CALCIUM 8.4* 8.2* 8.2* 8.2*  CREATININE 1.23 0.96 1.02 1.01  GFRNONAA 58* >60 >60 >60    LIVER FUNCTION TESTS: Recent Labs    09/21/23 0840 09/25/23 0220 09/27/23 0240 09/28/23 0243 09/29/23 0228  BILITOT 2.3* 0.9 0.8 0.7  --   AST 19 44* 41 40  --   ALT 12 27 24 22   --   ALKPHOS 80 111 94 93  --   PROT 7.2 6.0* 6.2* 6.2* 5.8*  ALBUMIN 3.7 2.4* 2.8* 2.7*  --     Assessment and Plan: 83 year old male with a history of acute calculous cholecystitis status post percutaneous cholecystostomy 06/29/23. He underwent a technically successful percutaneous gallstone retrieval 09/21/23 and his drain was capped 09/29/23 during his recent hospitalization for CHF exacerbation.  He has tolerated 1 month capping trial with good visualization of the cystic and common bile ducts today, no evidence of cholelithiasis or choledocholithiasis.  The drain was removed.  Follow up with IR as needed.  Marliss Coots, MD Pager: [phone]    I spent a total of 25 Minutes in face to face in clinical consultation, greater than 50% of which was counseling/coordinating care for cholelithiasis.

## 2023-10-25 ENCOUNTER — Ambulatory Visit
Admission: RE | Admit: 2023-10-25 | Discharge: 2023-10-25 | Disposition: A | Discharge status: Home / Self Care | Payer: Medicare PPO | Source: Ambulatory Visit | Attending: Interventional Radiology

## 2023-10-25 ENCOUNTER — Ambulatory Visit
Admission: RE | Admit: 2023-10-25 | Discharge: 2023-10-25 | Disposition: A | Discharge status: Home / Self Care | Payer: Medicare PPO | Source: Ambulatory Visit | Attending: Interventional Radiology | Admitting: Interventional Radiology

## 2023-10-25 DIAGNOSIS — K810 Acute cholecystitis: Secondary | ICD-10-CM

## 2023-10-25 HISTORY — PX: IR RADIOLOGIST EVAL & MGMT: IMG5224

## 2023-11-11 ENCOUNTER — Ambulatory Visit: Payer: Medicare PPO
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
## 2024-01-07 ENCOUNTER — Other Ambulatory Visit: Payer: Self-pay

## 2024-01-07 ENCOUNTER — Ambulatory Visit: Payer: Medicare PPO | Attending: Cardiovascular Disease | Admitting: Cardiovascular Disease

## 2024-01-07 ENCOUNTER — Encounter: Payer: Self-pay | Admitting: Cardiovascular Disease

## 2024-01-07 VITALS — BP 110/64 | HR 66 | Ht 70.0 in | Wt 207.2 lb

## 2024-01-07 DIAGNOSIS — I2510 Atherosclerotic heart disease of native coronary artery without angina pectoris: Secondary | ICD-10-CM

## 2024-01-07 NOTE — Patient Instructions (Signed)
 Medication Instructions:  Your physician recommends that you continue on your current medications as directed. Please refer to the Current Medication list given to you today.  *If you need a refill on your cardiac medications before your next appointment, please call your pharmacy*  Lab Work: Your physician recommends that you have lab work today downstairs on the 1st floor. Lipid panel, ALT and BMET If you have labs (blood work) drawn today and your tests are completely normal, you will receive your results only by: MyChart Message (if you have MyChart) OR A paper copy in the mail If you have any lab test that is abnormal or we need to change your treatment, we will call you to review the results.  Testing/Procedures: None ordered today.  Follow-Up: At [facility], you and your health needs are our priority.  As part of our continuing mission to provide you with exceptional heart care, our providers are all part of one team.  This team includes your primary Cardiologist (physician) and Advanced Practice Providers or APPs (Physician Assistants and Nurse Practitioners) who all work together to provide you with the care you need, when you need it.  Your next appointment:   6 month(s)  Provider:   Marlyse Single PA    We recommend signing up for the patient portal called "MyChart".  Sign up information is provided on this After Visit Summary.  MyChart is used to connect with patients for Virtual Visits (Telemedicine).  Patients are able to view lab/test results, encounter notes, upcoming appointments, etc.  Non-urgent messages can be sent to your provider as well.   To learn more about what you can do with MyChart, go to [email or web address].

## 2024-01-10 LAB — LIPID PANEL

## 2024-01-11 ENCOUNTER — Encounter: Payer: Self-pay | Admitting: Cardiovascular Disease

## 2024-01-11 LAB — LIPID PANEL
Cholesterol, Total: 142 mg/dL (ref 100–199)
HDL: 37 mg/dL — ABNORMAL LOW (ref >39)
LDL CALC COMMENT:: 3.8 ratio (ref 0.0–5.0)
LDL Chol Calc (NIH): 79 mg/dL (ref 0–99)
Triglycerides: 149 mg/dL (ref 0–149)
VLDL Cholesterol Cal: 26 mg/dL (ref 5–40)

## 2024-01-11 LAB — BASIC METABOLIC PANEL WITH GFR
BUN/Creatinine Ratio: 19 (ref 10–24)
BUN: 22 mg/dL (ref 8–27)
CO2: 25 mmol/L (ref 20–29)
Calcium: 9.8 mg/dL (ref 8.6–10.2)
Chloride: 98 mmol/L (ref 96–106)
Creatinine, Ser: 1.16 mg/dL (ref 0.76–1.27)
Glucose: 180 mg/dL — ABNORMAL HIGH (ref 70–99)
Potassium: 5 mmol/L (ref 3.5–5.2)
Sodium: 139 mmol/L (ref 134–144)
eGFR: 62 mL/min/{1.73_m2} (ref >59)

## 2024-01-11 LAB — ALT: ALT: 22 IU/L (ref 0–44)

## 2024-01-12 ENCOUNTER — Other Ambulatory Visit: Payer: Self-pay

## 2024-01-12 MED ORDER — POTASSIUM CHLORIDE CRYS ER 10 MEQ PO TBCR: 10.0000 meq | EXTENDED_RELEASE_TABLET | Freq: Every day | ORAL | 3 refills | Status: AC

## 2024-01-24 ENCOUNTER — Ambulatory Visit: Admitting: Podiatrist

## 2024-01-24 ENCOUNTER — Encounter: Payer: Self-pay | Admitting: Podiatrist

## 2024-01-24 DIAGNOSIS — L600 Ingrowing nail: Secondary | ICD-10-CM
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
## 2024-01-24 NOTE — Progress Notes (Signed)
 Chief Complaint  Patient presents with   Nail Problem    Pt presents for bil ingrown toenails      HPI: Patient is 83 y.o. male who presents today for painful ingrown toenails both great toenails. He relates the nails have been giving him pain for months and he is unable to trim the corners out to relive the pain. He would like the nails fixed if possible.    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 09/07/2023   Chronic rhinitis 09/07/2023   Deviated nasal septum 09/07/2023   Hypertrophy of nasal turbinates 09/07/2023   HFrEF (heart failure with reduced ejection fraction) (HCC) 09/02/2023   Stage 3a chronic kidney disease (HCC) 08/17/2023   Incontinence of feces 07/08/2023   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   Melena 06/30/2023   Preop cardiovascular exam 06/29/2023   Acute cholecystitis 06/26/2023   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   Controlled type 2 diabetes mellitus without complication (HCC) 10/14/2022   Primary osteoarthritis of left knee 10/06/2021   Ischemic cardiomyopathy 01/19/2013   Biventricular implantable cardioverter-defibrillator in situ 01/28/2012   Anemia 11/06/2011   Chronic combined systolic and diastolic heart failure (HCC)    Coronary artery disease involving coronary bypass graft of native heart without angina pectoris    Dyslipidemia    Gout    Long term (current) use of anticoagulants 12/18/2010   Atrial fibrillation (HCC) 02/25/2010   Essential hypertension, benign 01/14/2009    Current Outpatient Medications on File Prior to Visit  Medication Sig Dispense Refill   acetaminophen  (TYLENOL ) 325 MG tablet Take 2 tablets (650 mg total) by mouth every 4  (four) hours as needed for headache or mild pain.     allopurinol  (ZYLOPRIM ) 100 MG tablet Take 100 mg by mouth at bedtime.     apixaban  (ELIQUIS ) 5 MG TABS tablet Take 5 mg by mouth 2 (two) times daily.     carvedilol  (COREG ) 3.125 MG tablet TAKE 1 TABLET TWICE DAILY 180 tablet 3   latanoprost  (XALATAN ) 0.005 % ophthalmic solution Place 1 drop into both eyes at bedtime.      levothyroxine  (SYNTHROID ) 125 MCG tablet Take 1 tablet (125 mcg total) by mouth daily before breakfast. 90 tablet 3   nitroGLYCERIN  (NITROSTAT ) 0.4 MG SL tablet Place 1 tablet (0.4 mg total) under the tongue every 5 (five) minutes as needed for chest pain. 30 tablet 3   potassium chloride  (KLOR-CON  M) 10 MEQ tablet Take 1 tablet (10 mEq total) by mouth daily. 90 tablet 3   rosuvastatin  (CRESTOR ) 10 MG tablet TAKE 1/2 TABLET EVERY DAY 45 tablet 2   spironolactone  (ALDACTONE ) 25 MG tablet Take 1 tablet (25 mg total) by mouth daily. 30 tablet 0   torsemide  (DEMADEX ) 20 MG tablet Take 1 tablet (20 mg total) by mouth daily. 30 tablet 0   Current Facility-Administered Medications on File Prior to Visit  Medication Dose Route Frequency Provider Last Rate Last Admin   cefOXitin  (MEFOXIN ) 2 g in sodium chloride  0.9 % 100 mL IVPB  2 g Intravenous Once Caperilla, Marissa N, PA        Allergies  Allergen Reactions   Lipitor [Atorvastatin ] Other (See Comments)    MUSCLE ACHE    Jardiance  [Empagliflozin ] Other (  See Comments)    Intolerance     Review of Systems No fevers, chills, nausea, muscle aches, no difficulty breathing, no calf pain, no chest pain or shortness of breath.   Physical Exam  GENERAL APPEARANCE: Alert, conversant. Appropriately groomed. No acute distress.   VASCULAR: Pedal pulses palpable  2/4 DP and 2/4 PT bilateral.  Capillary refill time is immediate to all digits,  Proximal to distal cooling is warm to warm.  Digital perfusion adequate.   NEUROLOGIC: sensation is intact to 5.07 monofilament at 5/5  sites bilateral.  Light touch is intact bilateral, vibratory sensation intact bilateral  MUSCULOSKELETAL: acceptable muscle strength, tone and stability bilateral.  No gross boney pedal deformities noted.  No pain, crepitus or limitation noted with foot and ankle range of motion bilateral.   DERMATOLOGIC: skin is warm, supple, and dry. Deeply incurvated medial and lateral nail borders of both great toenails noted. Pincer nail deformity left is also seen. No pus or pirulance present.  Pain with pressure on the nail borders noted bilateral.     Assessment     ICD-10-CM   1. Ingrown right greater toenail  L60.0     2. Ingrown left greater toenail  L60.0        Plan  Treatment options and alternatives were discussed. Discussed we could fix one of the nails and wait a week and fix the other-  he discussed he would like to fix them both today and realized he will have to soak both and wrap both.  Therefore I recommended a permanent removal of the medial and lateral nail border of the left and right hallux nail.  Patient agreed. Skin was prepped with alcohol  and a local injection of lidocaine  and Marcaine  plain was infiltrated to anesthetize the toes. The toes were then prepped with Betadine  and exsanguinated. The offending nail border was removed x 4 and phenol applied to the exposed matrix tissue.  The area was then cleansed well with alcohol .  Surgicell and Antibiotic ointment and a dressing was then applied the tourniquet released  noting a prompt hyperemic response to the tip of the toe x 2.  Oral and written instructions were dispensed and the patient was instructed on aftercare. He will keep this dressing on until tomorrow morning when he will do his first soak.   If there is any increased redness, swelling, drainage, pus or any other concerns arise, Leshawn will call to be seen.   A return appointment for a recheck of the nails is made for 1 week.

## 2024-02-10 ENCOUNTER — Ambulatory Visit: Payer: Medicare PPO

## 2024-02-17 ENCOUNTER — Other Ambulatory Visit: Payer: Self-pay | Admitting: Cardiovascular Disease

## 2024-02-29 ENCOUNTER — Ambulatory Visit (INDEPENDENT_AMBULATORY_CARE_PROVIDER_SITE_OTHER): Admitting: Otolaryngology

## 2024-02-29 ENCOUNTER — Encounter (INDEPENDENT_AMBULATORY_CARE_PROVIDER_SITE_OTHER): Payer: Self-pay | Admitting: Otolaryngology

## 2024-02-29 VITALS — BP 111/68 | HR 75

## 2024-02-29 DIAGNOSIS — J343 Hypertrophy of nasal turbinates: Secondary | ICD-10-CM | POA: Diagnosis not present

## 2024-02-29 DIAGNOSIS — J310 Chronic rhinitis: Secondary | ICD-10-CM | POA: Diagnosis not present

## 2024-02-29 DIAGNOSIS — R0981 Nasal congestion: Secondary | ICD-10-CM | POA: Diagnosis not present

## 2024-02-29 DIAGNOSIS — J342 Deviated nasal septum: Secondary | ICD-10-CM

## 2024-02-29 DIAGNOSIS — H95121 Granulation of postmastoidectomy cavity, right ear: Secondary | ICD-10-CM | POA: Diagnosis not present

## 2024-02-29 MED ORDER — CIPROFLOXACIN-DEXAMETHASONE 0.3-0.1 % OT SUSP: 4.0000 [drp] | Freq: Two times a day (BID) | OTIC | 8 refills | Status: AC

## 2024-02-29 MED ORDER — MUPIROCIN 2 % EX OINT: 1.0000 | TOPICAL_OINTMENT | Freq: Three times a day (TID) | CUTANEOUS | 2 refills | Status: AC

## 2024-02-29 NOTE — Progress Notes (Unsigned)
 Patient ID: [name], male   DOB: December 25, 1940, 83 y.o.   MRN: [id number]  Follow-up: Chronic nasal congestion, chronic right mastoiditis  HPI: Patient is a 83 year old male who returns today for his follow-up evaluation.  Patient was previously seen for chronic nasal congestion, nasal septal deviation, and bilateral inferior turbinate hypertrophy.  He was treated with Flonase nasal spray.  He also has a history of chronic right otomastoiditis, requiring surgical treatment with right canal wall down tympanomastoidectomy surgery.  He has been returning regularly for right mastoid debridement.  The patient returns today reporting

## 2024-03-06 ENCOUNTER — Ambulatory Visit (INDEPENDENT_AMBULATORY_CARE_PROVIDER_SITE_OTHER): Payer: Medicare PPO | Admitting: Otolaryngology

## 2024-03-20 LAB — LAB REPORT - SCANNED: EGFR: 81

## 2024-05-11 ENCOUNTER — Ambulatory Visit: Payer: Medicare PPO

## 2024-06-14 ENCOUNTER — Ambulatory Visit: Payer: Self-pay | Admitting: Physician Assistant

## 2024-06-30 ENCOUNTER — Other Ambulatory Visit: Payer: Self-pay | Admitting: Physician Assistant

## 2024-06-30 DIAGNOSIS — I4821 Permanent atrial fibrillation: Secondary | ICD-10-CM

## 2024-06-30 NOTE — Telephone Encounter (Signed)
*  STAT* If patient is at the pharmacy, call can be transferred to refill team.   1. Which medications need to be refilled? (please list name of each medication and dose if known)   apixaban  (ELIQUIS ) 5 MG TABS tablet     2. Would you like to learn more about the convenience, safety, & potential cost savings by using the [facility]? No   3. Are you open to using the [facility] (Type [facility]. ) No   4. Which pharmacy/location (including street and city if local pharmacy) is medication to be sent to?  [facility] - [place], [place] - [address]   5. Do they need a 30 day or 90 day supply? 90 day  Pt is out of medication and needs enough sent to backup pharmacy ( [facility] - [place], [place] - [address] [address]) to last until Mail Order arrives

## 2024-07-03 MED ORDER — APIXABAN 5 MG PO TABS: 5.0000 mg | ORAL_TABLET | Freq: Two times a day (BID) | ORAL | 1 refills | Status: DC

## 2024-07-03 NOTE — Telephone Encounter (Signed)
 Prescription refill request for Eliquis  received. Indication: a fib Last office visit: 01/07/24 Scr: 1.16 epic 01/10/24 Age: 83 Weight: 94kg

## 2024-07-04 ENCOUNTER — Encounter: Payer: Self-pay | Admitting: *Deleted

## 2024-07-04 NOTE — Progress Notes (Signed)
 OFFICE NOTE:    Date:  07/05/2024  ID:  Charlie LITTIE Forster, DOB 20-Apr-1941, MRN [id number] PCP: Loring Tanda Mae, MD  [facility] Providers Cardiologist:  Aleene Passe, MD (Inactive) Electrophysiologist:  Elspeth Sage, MD (Inactive)        Coronary artery disease  S/p anterior STEMI in 2001 tx with PCI of LAD Cath 06/23/2004: LAD stents patent, D1 80-90 (small, not amenable to PCI) Myo PET 07/2022: Lg ant and septal infarcts, no ischemia, EF 15, global myocardial blood flow reserve 1.83 (abnormal) Cath 08/20/23: oLAD stent 90 ISR, mLAD 30, dLAD diffuse 40; pOM2 30; RCA irregs>>med Rx (Ant myocardium already infarcted, not at risk for re-infarction) (HFrEF) heart failure with reduced ejection fraction   Ischemic CM TTE 06/30/23: EF 25-30, ant and inf-sept DK, mod reduced RVSF, mod pulm HTN, RVSP 52.6, severe LAE, mod RAE, trivial MR, trivial AI, AV sclerosis, RAP 3  GDMT limited due to low BP Chronic Warfarin Rx  >> Apixaban   S/p CRT-D LV lead non-functional; device reprogrammed 05/2020 to allow LV pacing 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 History of Present Illness [name] is a 83 y.o. male for follow up of CAD, CHF, AFib. He was admitted with a/c CHF in 09/2023 and required R thoracentesis due to effusion. Last seen by Dr. Passe 01/2024. Notes indicate his condition has worsened since his device was extracted.   He is here alone. He has not had any chest pain.  He monitors his blood pressure daily with stable readings.  He experiences chronic leg pain and has had frequent wounds on his legs, which he attributes to poor circulation.  He does not wear compression hose due to discomfort. He has sleep disturbances, including difficulty sleeping and vivid dreams. He attributes morning headaches to nasal drainage. He has a history of snoring, and his wife previously noted episodes of apnea during sleep, but he has not been tested for sleep apnea and does not use a CPAP machine.    ROS-See HPI    Studies Reviewed:      Labs 09/30/23: Hgb 11.6, PLT 256K 01/10/24: K 5, SCr 1.16, ALT 22, TC 142, HDL 37, Trig 149, LDL 79  2/89/7974: Hgb 15.9, PLT 271K, creatinine 0.93, eGFR 81, K 4.4, albumin  4.1, ALT 15, TC 117, triglycerides 129, HDL 32, LDL 62, A1c 6.6 [primary care labs personally and reviewed by me today 07/05/24]  Risk Assessment/Calculations: CHA2DS2-VASc Score = 6   This indicates a 9.7% annual risk of stroke. The patient's score is based upon: CHF History: 1 HTN History: 1 Diabetes History: 1 Stroke History: 0 Vascular Disease History: 1 Age Score: 2 Gender Score: 0           Physical Exam:  VS:  BP 116/63 (BP Location: Left Arm, Patient Position: Sitting, Cuff Size: Large)   Pulse 75   Ht 5' 10.5 (1.791 m)   Wt 220 lb 3.2 oz (99.9 kg)   SpO2 96%   BMI 31.15 kg/m        Wt Readings from  Last 3 Encounters:  07/05/24 220 lb 3.2 oz (99.9 kg)  01/07/24 207 lb 3.2 oz (94 kg)  10/20/23 205 lb 12.8 oz (93.4 kg)    Constitutional:      Appearance: Healthy appearance. Not in distress.  Pulmonary:     Breath sounds: No wheezing. No rales.  Cardiovascular:     Normal rate. Irregularly irregular rhythm.     Murmurs: There is no murmur.     Comments: Chronic stasis changes Edema:    Peripheral edema present.    Ankle: bilateral trace edema of the ankle. Skin:    General: Skin is warm and dry.       Assessment and Plan:    Assessment & Plan Coronary artery disease involving native coronary  artery of native heart without angina pectoris History of anterior STEMI in 2001 treated PCI of LAD. Myocardial PET November 2023 was high risk with large anterior and septal infarcts but no ischemia. He was admitted in October 2024 with cholecystitis treated with percutaneous drain. Cardiac catheterization was recommended for risk stratification. This was recently done and demonstrated 90% in-stent restenosis of the stent in the LAD. He had mild to moderate nonobstructive disease elsewhere. It was felt that his anterior myocardium was already infarcted and PCI of this vessel would not improve outcomes. He is not at risk for reinfarcting this area. Medical therapy was recommended. He is not having symptoms of angina. - He is not on ASA as he is on Eliquis  for anticoagulation. - Continue nitroglycerin  as needed. - Continue rosuvastatin  5 mg daily. - Continue carvedilol  3.125 mg twice daily. - Will establish him with Dr. Floretta given Dr. Allena retirement in 6 mos HFrEF (heart failure with reduced ejection fraction) (HCC) EF 25-30 by echocardiogram October 2024.  He is NYHA IIb. Volume status is stable. He brought in weights from home. These have been stable. He has had difficulty in the past tolerating GDMT due to low BP.  - Continue carvedilol  3.125 mg twice daily. - Continue spironolactone  25 mg daily. - Continue torsemide  20 mg daily. - Follow up 6 mos.  Permanent atrial fibrillation (HCC) Rate control therapy. Long-term anticoagulation with Eliquis  5 mg based on weight, and creatinine. - Continue carvedilol  3.125 mg twice daily for rate control. - Continue Eliquis  5 mg twice daily for anticoagulation. Stage 3a chronic kidney disease (HCC) Recent Creatinine normal.  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 Plan was to not pursue reimplantation.  Claudication History of open leg wound He has had recurrent lower extremity  wounds and does note some leg pain. He does have venous insufficiency. Given recurrent wounds, recommend screening for arterial insufficiency.  - Order ABIs and lower extremity arterial dopplers   - Recommend compression stockings Snoring Reports difficulty sleeping with vivid dreams and history of snoring. Possible sleep apnea considered, but unwilling to undergo sleep testing or use CPAP. - Defer sleep testing for now.        Dispo:  Return in about 6 months (around 01/03/2025) for Routine Follow Up w/ Dr. Floretta.  Signed, Glendia Ferrier, PA-C

## 2024-07-04 NOTE — Assessment & Plan Note (Signed)
 EF 25-30 by echocardiogram October 2024.  He is NYHA IIb. Volume status is stable. He brought in weights from home. These have been stable. He has had difficulty in the past tolerating GDMT due to low BP.  - Continue carvedilol  3.125 mg twice daily. - Continue spironolactone  25 mg daily. - Continue torsemide  20 mg daily. - Follow up 6 mos.

## 2024-07-04 NOTE — Assessment & Plan Note (Signed)
 History of anterior STEMI in 2001 treated PCI of LAD. Myocardial PET November 2023 was high risk with large anterior and septal infarcts but no ischemia. He was admitted in October 2024 with cholecystitis treated with percutaneous drain. Cardiac catheterization was recommended for risk stratification. This was recently done and demonstrated 90% in-stent restenosis of the stent in the LAD. He had mild to moderate nonobstructive disease elsewhere. It was felt that his anterior myocardium was already infarcted and PCI of this vessel would not improve outcomes. He is not at risk for reinfarcting this area. Medical therapy was recommended. He is not having symptoms of angina. - He is not on ASA as he is on Eliquis  for anticoagulation. - Continue nitroglycerin  as needed. - Continue rosuvastatin  5 mg daily. - Continue carvedilol  3.125 mg twice daily. - Will establish him with Dr. Floretta given Dr. Allena retirement in 6 mos

## 2024-07-04 NOTE — Assessment & Plan Note (Signed)
 CIED infection s/p device extraction in 05/2023. Plan was to not pursue reimplantation.

## 2024-07-05 ENCOUNTER — Encounter: Payer: Self-pay | Admitting: Physician Assistant

## 2024-07-05 ENCOUNTER — Ambulatory Visit: Attending: Cardiology | Admitting: Physician Assistant

## 2024-07-05 VITALS — BP 116/63 | HR 75 | Ht 70.5 in | Wt 220.2 lb

## 2024-07-05 DIAGNOSIS — N1831 Chronic kidney disease, stage 3a: Secondary | ICD-10-CM

## 2024-07-05 DIAGNOSIS — I4821 Permanent atrial fibrillation: Secondary | ICD-10-CM

## 2024-07-05 DIAGNOSIS — Z95810 Presence of automatic (implantable) cardiac defibrillator: Secondary | ICD-10-CM

## 2024-07-05 DIAGNOSIS — R0683 Snoring: Secondary | ICD-10-CM

## 2024-07-05 DIAGNOSIS — I2510 Atherosclerotic heart disease of native coronary artery without angina pectoris: Secondary | ICD-10-CM

## 2024-07-05 DIAGNOSIS — I739 Peripheral vascular disease, unspecified: Secondary | ICD-10-CM

## 2024-07-05 DIAGNOSIS — I5020 Unspecified systolic (congestive) heart failure: Secondary | ICD-10-CM | POA: Diagnosis not present

## 2024-07-05 DIAGNOSIS — Z87828 Personal history of other (healed) physical injury and trauma: Secondary | ICD-10-CM

## 2024-07-05 MED ORDER — NITROGLYCERIN 0.4 MG SL SUBL: 0.4000 mg | SUBLINGUAL_TABLET | SUBLINGUAL | 11 refills | Status: AC | PRN

## 2024-07-05 MED ORDER — APIXABAN 5 MG PO TABS: 5.0000 mg | ORAL_TABLET | Freq: Two times a day (BID) | ORAL | 3 refills | Status: AC

## 2024-07-05 NOTE — Patient Instructions (Signed)
 Medication Instructions:  Your physician recommends that you continue on your current medications as directed. Please refer to the Current Medication list given to you today.   Labwork: None  Testing/Procedures: Your physician has requested that you have an ankle brachial index (ABI). During this test an ultrasound and blood pressure cuff are used to evaluate the arteries that supply the arms and legs with blood. Allow thirty minutes for this exam. There are no restrictions or special instructions.  Please note: We ask at that you not bring children with you during ultrasound (echo/ vascular) testing. Due to room size and safety concerns, children are not allowed in the ultrasound rooms during exams. Our front office staff cannot provide observation of children in our lobby area while testing is being conducted. An adult accompanying a patient to their appointment will only be allowed in the ultrasound room at the discretion of the ultrasound technician under special circumstances. We apologize for any inconvenience.  Your physician has requested that you have a lower or upper extremity arterial duplex. This test is an ultrasound of the arteries in the legs or arms. It looks at arterial blood flow in the legs and arms. Allow one hour for Lower and Upper Arterial scans. There are no restrictions or special instructions.  Please note: We ask at that you not bring children with you during ultrasound (echo/ vascular) testing. Due to room size and safety concerns, children are not allowed in the ultrasound rooms during exams. Our front office staff cannot provide observation of children in our lobby area while testing is being conducted. An adult accompanying a patient to their appointment will only be allowed in the ultrasound room at the discretion of the ultrasound technician under special circumstances. We apologize for any inconvenience.   Follow-Up: Your physician recommends that you schedule a  follow-up appointment in: 6 months  Any Other Special Instructions Will Be Listed Below (If Applicable). Thank you for choosing [facility]!     If you need a refill on your cardiac medications before your next appointment, please call your pharmacy.

## 2024-07-05 NOTE — Assessment & Plan Note (Signed)
 Rate control therapy. Long-term anticoagulation with Eliquis  5 mg based on weight, and creatinine. - Continue carvedilol  3.125 mg twice daily for rate control. - Continue Eliquis  5 mg twice daily for anticoagulation.

## 2024-07-05 NOTE — Assessment & Plan Note (Signed)
 Recent Creatinine normal.

## 2024-07-09 ENCOUNTER — Emergency Department (HOSPITAL_COMMUNITY)

## 2024-07-09 ENCOUNTER — Emergency Department (HOSPITAL_COMMUNITY)
Admission: EM | Admit: 2024-07-09 | Discharge: 2024-07-09 | Disposition: A | Discharge status: Home / Self Care | Attending: Emergency Medicine | Admitting: Emergency Medicine

## 2024-07-09 DIAGNOSIS — S51019A Laceration without foreign body of unspecified elbow, initial encounter: Secondary | ICD-10-CM

## 2024-07-09 DIAGNOSIS — R932 Abnormal findings on diagnostic imaging of liver and biliary tract: Secondary | ICD-10-CM | POA: Diagnosis not present

## 2024-07-09 DIAGNOSIS — S51011A Laceration without foreign body of right elbow, initial encounter: Secondary | ICD-10-CM | POA: Diagnosis not present

## 2024-07-09 DIAGNOSIS — Z7901 Long term (current) use of anticoagulants: Secondary | ICD-10-CM | POA: Diagnosis not present

## 2024-07-09 DIAGNOSIS — N189 Chronic kidney disease, unspecified: Secondary | ICD-10-CM | POA: Insufficient documentation

## 2024-07-09 DIAGNOSIS — R918 Other nonspecific abnormal finding of lung field: Secondary | ICD-10-CM | POA: Diagnosis not present

## 2024-07-09 DIAGNOSIS — I5022 Chronic systolic (congestive) heart failure: Secondary | ICD-10-CM | POA: Diagnosis not present

## 2024-07-09 DIAGNOSIS — M542 Cervicalgia: Secondary | ICD-10-CM | POA: Insufficient documentation

## 2024-07-09 DIAGNOSIS — R0789 Other chest pain: Secondary | ICD-10-CM | POA: Insufficient documentation

## 2024-07-09 DIAGNOSIS — Y92410 Unspecified street and highway as the place of occurrence of the external cause: Secondary | ICD-10-CM | POA: Insufficient documentation

## 2024-07-09 DIAGNOSIS — I2510 Atherosclerotic heart disease of native coronary artery without angina pectoris: Secondary | ICD-10-CM | POA: Insufficient documentation

## 2024-07-09 DIAGNOSIS — S42031A Displaced fracture of lateral end of right clavicle, initial encounter for closed fracture: Secondary | ICD-10-CM | POA: Diagnosis not present

## 2024-07-09 DIAGNOSIS — S51811A Laceration without foreign body of right forearm, initial encounter: Secondary | ICD-10-CM | POA: Diagnosis not present

## 2024-07-09 DIAGNOSIS — R1011 Right upper quadrant pain: Secondary | ICD-10-CM | POA: Insufficient documentation

## 2024-07-09 DIAGNOSIS — I480 Paroxysmal atrial fibrillation: Secondary | ICD-10-CM | POA: Diagnosis not present

## 2024-07-09 DIAGNOSIS — M25511 Pain in right shoulder: Secondary | ICD-10-CM | POA: Diagnosis present

## 2024-07-09 DIAGNOSIS — S0990XA Unspecified injury of head, initial encounter: Secondary | ICD-10-CM | POA: Diagnosis present

## 2024-07-09 LAB — COMPREHENSIVE METABOLIC PANEL WITH GFR
ALT: 20 U/L (ref 0–44)
AST: 55 U/L — ABNORMAL HIGH (ref 15–41)
Albumin: 3.4 g/dL — ABNORMAL LOW (ref 3.5–5.0)
Alkaline Phosphatase: 76 U/L (ref 38–126)
Anion gap: 12 (ref 5–15)
BUN: 19 mg/dL (ref 8–23)
CO2: 26 mmol/L (ref 22–32)
Calcium: 9 mg/dL (ref 8.9–10.3)
Chloride: 99 mmol/L (ref 98–111)
Creatinine, Ser: 1.27 mg/dL — ABNORMAL HIGH (ref 0.61–1.24)
GFR, Estimated: 56 mL/min — ABNORMAL LOW (ref >60)
Glucose, Bld: 128 mg/dL — ABNORMAL HIGH (ref 70–99)
Potassium: 3.8 mmol/L (ref 3.5–5.1)
Sodium: 137 mmol/L (ref 135–145)
Total Bilirubin: 1.4 mg/dL — ABNORMAL HIGH (ref 0.0–1.2)
Total Protein: 6.5 g/dL (ref 6.5–8.1)

## 2024-07-09 LAB — CBC
HCT: 44.8 % (ref 39.0–52.0)
Hemoglobin: 15 g/dL (ref 13.0–17.0)
MCH: 33.1 pg (ref 26.0–34.0)
MCHC: 33.5 g/dL (ref 30.0–36.0)
MCV: 98.9 fL (ref 80.0–100.0)
Platelets: 271 K/uL (ref 150–400)
RBC: 4.53 MIL/uL (ref 4.22–5.81)
RDW: 12.8 % (ref 11.5–15.5)
WBC: 10.3 K/uL (ref 4.0–10.5)
nRBC: 0 % (ref 0.0–0.2)

## 2024-07-09 LAB — PROTIME-INR
INR: 1.3 — ABNORMAL HIGH (ref 0.8–1.2)
Prothrombin Time: 16.9 s — ABNORMAL HIGH (ref 11.4–15.2)

## 2024-07-09 LAB — I-STAT CHEM 8, ED
BUN: 29 mg/dL — ABNORMAL HIGH (ref 8–23)
Calcium, Ion: 1.14 mmol/L — ABNORMAL LOW (ref 1.15–1.40)
Chloride: 100 mmol/L (ref 98–111)
Creatinine, Ser: 1.3 mg/dL — ABNORMAL HIGH (ref 0.61–1.24)
Glucose, Bld: 152 mg/dL — ABNORMAL HIGH (ref 70–99)
HCT: 46 % (ref 39.0–52.0)
Hemoglobin: 15.6 g/dL (ref 13.0–17.0)
Potassium: 5 mmol/L (ref 3.5–5.1)
Sodium: 137 mmol/L (ref 135–145)
TCO2: 29 mmol/L (ref 22–32)

## 2024-07-09 LAB — SAMPLE TO BLOOD BANK

## 2024-07-09 LAB — ETHANOL: Alcohol, Ethyl (B): <15 mg/dL (ref <15)

## 2024-07-09 LAB — I-STAT CG4 LACTIC ACID, ED: Lactic Acid, Venous: 1.5 mmol/L (ref 0.5–1.9)

## 2024-07-09 MED ORDER — OXYCODONE HCL 5 MG PO TABS: 5.0000 mg | ORAL_TABLET | ORAL | 0 refills | Status: AC | PRN

## 2024-07-09 MED ORDER — OXYCODONE-ACETAMINOPHEN 5-325 MG PO TABS: 1.0000 | ORAL_TABLET | Freq: Four times a day (QID) | ORAL | 0 refills | Status: DC | PRN

## 2024-07-09 MED ORDER — FENTANYL CITRATE (PF) 50 MCG/ML IJ SOSY
50.0000 ug | PREFILLED_SYRINGE | Freq: Once | INTRAMUSCULAR | Status: AC
  Administered 2024-07-09: 50 ug via INTRAVENOUS
  Filled 2024-07-09: qty 1

## 2024-07-09 MED ORDER — HYDROMORPHONE HCL 1 MG/ML IJ SOLN
0.5000 mg | Freq: Once | INTRAMUSCULAR | Status: AC
  Administered 2024-07-09: 0.5 mg via INTRAVENOUS
  Filled 2024-07-09: qty 1

## 2024-07-09 MED ORDER — DOXYCYCLINE HYCLATE 100 MG PO CAPS: 100.0000 mg | ORAL_CAPSULE | Freq: Two times a day (BID) | ORAL | 0 refills | Status: AC

## 2024-07-09 MED ORDER — IOHEXOL 350 MG/ML SOLN
75.0000 mL | Freq: Once | INTRAVENOUS | Status: AC | PRN
  Administered 2024-07-09: 75 mL via INTRAVENOUS
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
## 2024-07-09 NOTE — Progress Notes (Signed)
 Orthopedic Tech Progress Note Patient Details:  [name] 10/01/1940 [id number]  Ortho Devices Type of Ortho Device: Sling immobilizer Ortho Device/Splint Interventions: Ordered   Post Interventions Instructions Provided: Care of device, Adjustment of device Patients family requested sling not be applied until he was dressed to go home. Informed them I was unaware of discharge status at this time, instructions on sling application provided. Kevork Joyce A Alexzandra Bilton 07/09/2024, 3:20 PM

## 2024-07-09 NOTE — Discharge Instructions (Addendum)
 Take the medications to help with your pain.  You can use over-the-counter Tylenol  and reserve the oxycodone  for more severe pain.  Follow-up with Dr. Georgina regarding your clavicle fracture.  The CT scan did show possible infection in the lungs.  As we discussed since you are coughing so much I did prescribe doxycycline  to treat infection.  The CT scan also showed an incidental nodule in the gallbladder.  THere was also an incidental lung nodule where they recommended repeat imaging in 3-6 months
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
## 2024-07-09 NOTE — Progress Notes (Signed)
 Orthopedic Tech Progress Note Patient Details:  [name] 26-Feb-1941 [id number]  Patient ID: [name], male   DOB: 06/04/1941, 83 y.o.   MRN: [id number] Level II; not currently needed. [name] 07/09/2024, 1:03 PM

## 2024-07-13 ENCOUNTER — Other Ambulatory Visit: Payer: Self-pay | Admitting: Family Medicine

## 2024-07-13 DIAGNOSIS — K828 Other specified diseases of gallbladder: Secondary | ICD-10-CM

## 2024-07-21 ENCOUNTER — Inpatient Hospital Stay: Admission: RE | Admit: 2024-07-21 | Source: Ambulatory Visit

## 2024-07-21 ENCOUNTER — Ambulatory Visit
Admission: RE | Admit: 2024-07-21 | Discharge: 2024-07-21 | Disposition: A | Discharge status: Home / Self Care | Source: Ambulatory Visit | Attending: Family Medicine | Admitting: Family Medicine

## 2024-07-21 DIAGNOSIS — K828 Other specified diseases of gallbladder: Secondary | ICD-10-CM

## 2024-08-08 ENCOUNTER — Other Ambulatory Visit: Payer: Self-pay | Admitting: Student in an Organized Health Care Education/Training Program

## 2024-08-10 ENCOUNTER — Other Ambulatory Visit (HOSPITAL_COMMUNITY): Payer: Self-pay

## 2024-08-10 ENCOUNTER — Ambulatory Visit: Payer: Medicare PPO

## 2024-08-10 ENCOUNTER — Encounter (HOSPITAL_COMMUNITY): Payer: Self-pay

## 2024-08-10 ENCOUNTER — Ambulatory Visit (HOSPITAL_COMMUNITY)
Admission: RE | Admit: 2024-08-10 | Discharge: 2024-08-10 | Disposition: A | Source: Ambulatory Visit | Attending: Physician Assistant | Admitting: Physician Assistant

## 2024-08-10 ENCOUNTER — Ambulatory Visit (HOSPITAL_COMMUNITY)
Admission: RE | Admit: 2024-08-10 | Discharge: 2024-08-10 | Disposition: A | Discharge status: Home / Self Care | Source: Ambulatory Visit | Attending: Physician Assistant | Admitting: Physician Assistant

## 2024-08-10 DIAGNOSIS — I739 Peripheral vascular disease, unspecified: Secondary | ICD-10-CM | POA: Diagnosis present

## 2024-08-10 DIAGNOSIS — Z87828 Personal history of other (healed) physical injury and trauma: Secondary | ICD-10-CM | POA: Insufficient documentation

## 2024-08-10 MED ORDER — CARVEDILOL 3.125 MG PO TABS
3.1250 mg | ORAL_TABLET | Freq: Two times a day (BID) | ORAL | 3 refills | Status: DC
  Filled 2024-08-10: qty 180, 90d supply, fill #0

## 2024-08-10 MED ORDER — CARVEDILOL 3.125 MG PO TABS: 3.1250 mg | ORAL_TABLET | Freq: Two times a day (BID) | ORAL | 3 refills | Status: AC

## 2024-08-10 NOTE — Addendum Note (Signed)
 Addended by: JOSHUA ANDREZ PARAS on: 08/10/2024 11:07 AM   Modules accepted: Orders

## 2024-08-11 ENCOUNTER — Encounter (INDEPENDENT_AMBULATORY_CARE_PROVIDER_SITE_OTHER): Payer: Self-pay | Admitting: Otolaryngology

## 2024-08-11 ENCOUNTER — Ambulatory Visit (INDEPENDENT_AMBULATORY_CARE_PROVIDER_SITE_OTHER): Admitting: Otolaryngology

## 2024-08-11 VITALS — HR 66 | Temp 97.5°F | Ht 70.5 in | Wt 210.0 lb

## 2024-08-11 DIAGNOSIS — H6982 Other specified disorders of Eustachian tube, left ear: Secondary | ICD-10-CM | POA: Insufficient documentation

## 2024-08-11 DIAGNOSIS — J310 Chronic rhinitis: Secondary | ICD-10-CM | POA: Diagnosis not present

## 2024-08-11 DIAGNOSIS — H95121 Granulation of postmastoidectomy cavity, right ear: Secondary | ICD-10-CM

## 2024-08-11 DIAGNOSIS — J343 Hypertrophy of nasal turbinates: Secondary | ICD-10-CM

## 2024-08-11 DIAGNOSIS — J342 Deviated nasal septum: Secondary | ICD-10-CM

## 2024-08-11 DIAGNOSIS — H7011 Chronic mastoiditis, right ear: Secondary | ICD-10-CM | POA: Diagnosis not present

## 2024-08-11 LAB — VAS US ABI WITH/WO TBI
Left ABI: 1.12
Right ABI: 1.25
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
## 2024-08-15 ENCOUNTER — Ambulatory Visit: Payer: Self-pay | Admitting: Physician Assistant

## 2024-08-15 ENCOUNTER — Encounter: Payer: Self-pay | Admitting: Physician Assistant

## 2024-09-07 NOTE — Progress Notes (Signed)
 " Cardiology Office Note:   Date:  09/07/2024  ID:  [name], DOB 1941/08/06, MRN [id number] PCP: Loring Tanda Mae, MD  [facility] Providers Cardiologist:  Georganna Archer, MD Cardiology APP:  Lelon Glendia DASEN, PA-C  Electrophysiologist:  Elspeth Sage, MD (Inactive) { Chief Complaint: No chief complaint on file.     History of Present Illness:   [name] is a 84 y.o. male with a PMH of CAD s/p PCI to LAD (2005), ICM (EF = 25-30%), permanent AF (on Eliquis ), HLD, ICD infection s/p extraction (9/24), CKD, who presents for follow up ***.   Past Medical History:  Diagnosis Date   AICD (automatic cardioverter/defibrillator) present    Anginal pain    Arthritis    knees, back    BPH (benign prostatic hypertrophy)    CHF (congestive heart failure) (HCC)    Chronic kidney disease    BPH   Chronic systolic heart failure (HCC)    a. s/p MDT single chamber ICD 2004 as part of MASTER study b. upgrade to CRTD 2010; CRTD gen change 2016   Coronary artery disease    a. s/p anterior MI and CYPHER stent to LAD 2005   Dyslipidemia    Dyspnea    Dysrhythmia    a-fib   Gout    Ischemic cardiomyopathy    Leg pain    ABIs 08/11/24: R 1.25, L 1.12; TBIs abnormal (R 1, L 0.68)   Paroxysmal atrial fibrillation (HCC)    Ventricular tachycardia (HCC)      Studies Reviewed:    EKG: ***       Cardiac Studies & Procedures   ______________________________________________________________________________________________ CARDIAC CATHETERIZATION  CARDIAC CATHETERIZATION 08/20/2023  Conclusion Images from the original result were not included. Coronary angiography 08/20/2023: LM: Normal LAD: Ostial-proximal stent 90% ISR 30% focal mid LAD and 40% diffuse distal LAD disease Lcx: Large OM2 with prox 30% disease RCA: Minimal luminal irregularities  LVEDP 30 mmHg  One-vessel obstructive CAD Ischemic cardiomyopathy    Discussed w/Dr. Alveta Severe ostial-proximal  LAD stent ISR, with TIMI II flow in LAD, elevated LVEDP at 30 mmHg. Myocardial PET in 07/2022 showed large anterior and septal infarcts, EF 15%, no ischemia. We suspect he has already infarcted anterior myocardium, and therefore, not at risk for re-infarction as such. His perioperative cardiac risk remains elevated by the virtue of having ischemic cardiomyopathy, EF 25-30% on recent echocardiogram, LVEDP 30 mmHg, but this will not be mitigated by any coronary revascularization. Continue GDMT for HFrEF.  Newman JINNY Lawrence, MD  Findings Coronary Findings Diagnostic  Dominance: Right  Left Anterior Descending Ost LAD to Prox LAD lesion is 80% stenosed. The lesion was previously treated over 2 years ago. Mid LAD lesion is 30% stenosed. Dist LAD lesion is 40% stenosed.  Left Circumflex  First Obtuse Marginal Branch Vessel is small in size.  Second Obtuse Marginal Branch 2nd Mrg lesion is 30% stenosed.  Right Coronary Artery Vessel is normal in caliber. The vessel exhibits minimal luminal irregularities.  Intervention  No interventions have been documented.   STRESS TESTS  NM PET CT CARDIAC PERFUSION MULTI W/ABSOLUTE BLOODFLOW 07/21/2022  Narrative   Findings are consistent with prior myocardial infarction. 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   LV perfusion is abnormal. There is evidence of infarction. Defect 1: There is a large  defect with severe reduction in uptake present in the apical to basal anterior, anteroseptal and apex location(s) that is fixed. There is abnormal wall motion in the defect area. Consistent with infarction. Defect 2: There is a small defect with moderate reduction in uptake present in the apical to mid inferoseptal location(s) that is fixed. There is abnormal wall motion in the defect area. Consistent with  infarction.   Rest left ventricular function is abnormal. Rest global function is severely reduced. There were multiple regional abnormalities. Rest EF: 15 %. Stress left ventricular function is abnormal. Stress global function is severely reduced. There were multiple regional abnormalities. Stress EF: 10 %. End systolic cavity size is severely enlarged. Recommend TTE for further evaluation of LVEF.   Myocardial blood flow was computed to be 0.34ml/g/min at rest and 0.97ml/g/min at stress. Global myocardial blood flow reserve was 1.83 and was abnormal.   Coronary calcium  was present on the attenuation correction CT images. Severe coronary calcifications were present. Coronary calcifications were present in the left anterior descending artery, left circumflex artery and right coronary artery distribution(s).  CLINICAL DATA:  This over-read does not include interpretation of cardiac or coronary anatomy or pathology. The cardiac PET-CT interpretation by the cardiologist is attached.  COMPARISON:  None Available.  FINDINGS: Heart size is mildly enlarged. Myocardial calcifications are noted in the LAD territory. No significant pericardial fluid, thickening or pericardial calcification. Atherosclerotic calcifications in the left main, left anterior descending, left circumflex and right coronary arteries. Pacemaker/AICD leads terminating in the right atrial appendage, right ventricular apex, and overlying the lateral wall the left ventricle via the coronary sinus and coronary veins. Small hiatal hernia. No lymphadenopathy noted in the visualized portions of the thorax. Mild scarring in the lung bases. No acute consolidative airspace disease. Trace right pleural effusion. No left pleural effusion. No aggressive appearing lytic or blastic lesions are noted in the visualized portions of the skeleton.  IMPRESSION: 1. Mild cardiomegaly. 2. Atherosclerosis, including left main and three-vessel  coronary artery disease. 3. Trace right pleural effusion.   Electronically Signed By: Toribio Aye M.D. On: 07/21/2022 10:53   ECHOCARDIOGRAM  ECHOCARDIOGRAM COMPLETE 06/30/2023  Narrative ECHOCARDIOGRAM REPORT    Patient Name:   [name] Maciver Date of Exam: 06/30/2023 Medical Rec #:  [id number]      Height:       70.0 in Accession #:    [id number]     Weight:       227.1 lb Date of Birth:  [birth year]      BSA:          2.203 m Patient Age:    82 years       BP:           113/65 mmHg Patient Gender: M              HR:           69 bpm. Exam Location:  Inpatient  Procedure: 2D Echo, Cardiac Doppler, Color Doppler and Intracardiac Opacification Agent  Indications:    CHF  History:        Patient has prior history of Echocardiogram examinations, most recent 11/23/2019. CHF, Aortic Valve Disease; Risk Factors:Hypertension and Diabetes.  Sonographer:    Ozell Free Referring Phys: [id number] CHRISTOPHER L SCHUMANN   Sonographer Comments: Technically difficult study due to poor echo windows and patient is obese. Image acquisition challenging due to patient body habitus. IMPRESSIONS   1. Left ventricular ejection fraction, by estimation, is  25 to 30%. The left ventricle has severely decreased function. The left ventricle demonstrates regional wall motion abnormalities (see scoring diagram/findings for description). Left ventricular diastolic function could not be evaluated. 2. Right ventricular systolic function is moderately reduced. The right ventricular size is normal. There is moderately elevated pulmonary artery systolic pressure. 3. Left atrial size was severely dilated. 4. Right atrial size was moderately dilated. 5. The mitral valve is normal in structure. Trivial mitral valve regurgitation. No evidence of mitral stenosis. 6. The aortic valve is normal in structure. There is mild calcification of the aortic valve. Aortic valve regurgitation is trivial. Aortic valve  sclerosis/calcification is present, without any evidence of aortic stenosis. 7. The inferior vena cava is normal in size with greater than 50% respiratory variability, suggesting right atrial pressure of 3 mmHg.  Conclusion(s)/Recommendation(s): Technically difficult study with limited images even with Definity  contrast.  FINDINGS Left Ventricle: Left ventricular ejection fraction, by estimation, is 25 to 30%. The left ventricle has severely decreased function. The left ventricle demonstrates regional wall motion abnormalities. Definity  contrast agent was given IV to delineate the left ventricular endocardial borders. The left ventricular internal cavity size was normal in size. There is no left ventricular hypertrophy. Left ventricular diastolic function could not be evaluated due to atrial fibrillation. Left ventricular diastolic function could not be evaluated.   LV Wall Scoring: The anterior septum, basal anterior segment, and basal inferoseptal segment are dyskinetic.  Right Ventricle: The right ventricular size is normal. No increase in right ventricular wall thickness. Right ventricular systolic function is moderately reduced. There is moderately elevated pulmonary artery systolic pressure. The tricuspid regurgitant velocity is 3.52 m/s, and with an assumed right atrial pressure of 3 mmHg, the estimated right ventricular systolic pressure is 52.6 mmHg.  Left Atrium: Left atrial size was severely dilated.  Right Atrium: Right atrial size was moderately dilated.  Pericardium: There is no evidence of pericardial effusion.  Mitral Valve: The mitral valve is normal in structure. Trivial mitral valve regurgitation. No evidence of mitral valve stenosis.  Tricuspid Valve: The tricuspid valve is normal in structure. Tricuspid valve regurgitation is trivial. No evidence of tricuspid stenosis.  Aortic Valve: The aortic valve is normal in structure. There is mild calcification of the aortic  valve. Aortic valve regurgitation is trivial. Aortic valve sclerosis/calcification is present, without any evidence of aortic stenosis. Aortic valve mean gradient measures 3.6 mmHg. Aortic valve peak gradient measures 7.4 mmHg. Aortic valve area, by VTI measures 2.80 cm.  Pulmonic Valve: The pulmonic valve was not well visualized. Pulmonic valve regurgitation is not visualized. No evidence of pulmonic stenosis.  Aorta: The aortic root is normal in size and structure.  Venous: The inferior vena cava is normal in size with greater than 50% respiratory variability, suggesting right atrial pressure of 3 mmHg.  IAS/Shunts: No atrial level shunt detected by color flow Doppler.   LEFT VENTRICLE PLAX 2D LVIDd:         6.20 cm   Diastology LVIDs:         5.45 cm   LV e' medial:    5.34 cm/s LV PW:         1.20 cm   LV E/e' medial:  19.9 LV IVS:        0.95 cm   LV e' lateral:   9.32 cm/s LVOT diam:     2.30 cm   LV E/e' lateral: 11.4 LV SV:         72 LV SV  Index:   33 LVOT Area:     4.15 cm   RIGHT VENTRICLE RV Basal diam:  3.90 cm RV S prime:     8.19 cm/s TAPSE (M-mode): 1.8 cm  LEFT ATRIUM              Index        RIGHT ATRIUM           Index LA diam:        5.30 cm  2.41 cm/m   RA Area:     24.90 cm LA Vol (A2C):   114.0 ml 51.75 ml/m  RA Volume:   77.40 ml  35.13 ml/m LA Vol (A4C):   136.0 ml 61.73 ml/m LA Biplane Vol: 129.0 ml 58.56 ml/m AORTIC VALVE                    PULMONIC VALVE AV Area (Vmax):    2.65 cm     PV Vmax:       0.77 m/s AV Area (Vmean):   2.52 cm     PV Vmean:      48.267 cm/s AV Area (VTI):     2.80 cm     PV VTI:        0.144 m AV Vmax:           136.00 cm/s  PV Peak grad:  2.4 mmHg AV Vmean:          91.460 cm/s  PV Mean grad:  1.0 mmHg AV VTI:            0.258 m AV Peak Grad:      7.4 mmHg AV Mean Grad:      3.6 mmHg LVOT Vmax:         86.83 cm/s LVOT Vmean:        55.467 cm/s LVOT VTI:          0.174 m LVOT/AV VTI ratio: 0.67  AORTA Ao  Root diam: 3.20 cm Ao Asc diam:  3.30 cm  MITRAL VALVE                TRICUSPID VALVE MV Area (PHT): 4.56 cm     TR Peak grad:   49.6 mmHg MV Decel Time: 166 msec     TR Vmax:        352.00 cm/s MV E velocity: 106.33 cm/s MV A velocity: 107.00 cm/s  SHUNTS MV E/A ratio:  0.99         Systemic VTI:  0.17 m Systemic Diam: 2.30 cm  Toribio Fuel MD Electronically signed by Toribio Fuel MD Signature Date/Time: 06/30/2023/3:00:52 PM    Final   TEE  ECHO TEE 05/16/2019  Narrative TRANSESOPHOGEAL ECHO REPORT    Patient Name:   [name] Frost Date of Exam: 05/16/2019 Medical Rec #:  [id number]      Height:       71.0 in Accession #:    [id number]     Weight:       214.2 lb Date of Birth:  04/28/1941      BSA:          2.17 m Patient Age:    78 years       BP:           101/69 mmHg Patient Gender: M              HR:           72 bpm. Exam  Location:  Outpatient   Procedure: 2D Echo, Transesophageal Echo, Cardiac Doppler and Color Doppler  Indications:    I25.10 CAD  History:        Patient has prior history of Echocardiogram examinations, most recent 05/10/2019. CHF and Cardiomyopathy CAD and Previous Myocardial Infarction Defibrillator Risk Factors: Dyslipidemia. V-Tach.  Sonographer:    Elinor Fresh RDCS (AE) Referring Phys: [id number] DARRYLE NED O'NEAL    PROCEDURE: The transesophogeal probe was passed through the esophogus of the patient. The patient developed no complications during the procedure.  IMPRESSIONS   1. Pacer wires seen in the RA/RV. There is no evidence of vegetation on the pacer leads on this study. There is no evidence of RA mass. 2. The left ventricle has severely reduced systolic function, with an ejection fraction of 25-30%. The cavity size was normal. Findings are consistent with ischemic cardiomyopathy. 3. Akinesis of anteroseptum. 4. The right ventricle has normal systolc function. The cavity was normal. There is no increase in right  ventricular wall thickness. 5. Left atrial size was mildly dilated. 6. No LAA thrombus. 7. The mitral valve is grossly normal. 8. The aortic valve is tricuspid Mild thickening of the aortic valve. No stenosis of the aortic valve. 9. The aortic root and ascending aorta are normal in size and structure. 10. Grade 2 atheroma in the ascending aortic arch. 11. The tricuspid valve was grossly normal. 12. When compared to the prior study: No evidence of RA mass or infective endocarditis.  FINDINGS Left Ventricle: The left ventricle has severely reduced systolic function, with an ejection fraction of 25-30%. The cavity size was normal. There is no increase in left ventricular wall thickness. Findings are consistent with ischemic cardiomyopathy. Akinesis of anteroseptum.  Right Ventricle: The right ventricle has normal systolic function. The cavity was normal. There is no increase in right ventricular wall thickness. Pacing wire/catheter visualized in the right ventricle.  Left Atrium: Left atrial size was mildly dilated. No LAA thrombus.   Right Atrium: Right atrial size was normal in size. Right atrial pressure is estimated at 10 mmHg. Prominent Eustachian valve. Pacer wires seen in the RA/RV. There is no evidence of vegetation on the pacer leads on this study. There is no evidence of RA mass.  Interatrial Septum: No atrial level shunt detected by color flow Doppler.  Pericardium: There is no evidence of pericardial effusion.  Mitral Valve: The mitral valve is grossly normal. Mitral valve regurgitation is trivial by color flow Doppler.  Tricuspid Valve: The tricuspid valve was grossly normal. Tricuspid valve regurgitation is mild by color flow Doppler.  Aortic Valve: The aortic valve is tricuspid Mild thickening of the aortic valve. Aortic valve regurgitation was not visualized by color flow Doppler. There is No stenosis of the aortic valve.  Pulmonic Valve: The pulmonic valve was grossly  normal. Pulmonic valve regurgitation is not visualized by color flow Doppler.  Aorta: The aortic root and ascending aorta are normal in size and structure. Grade 2 atheroma in the ascending aortic arch.  Compared to previous exam: No evidence of RA mass or infective endocarditis.    +-------------+-------++ AORTA                +-------------+-------++ Ao Root diam:3.60 cm +-------------+-------++ Ao Asc diam: 3.60 cm +-------------+-------++  +---------------+-----------++ TRICUSPID VALVE            +---------------+-----------++ TR Peak grad:  11.6 mmHg   +---------------+-----------++ TR Vmax:       170.00 cm/s +---------------+-----------++  Darryle Decent MD Electronically signed by Darryle Decent MD Signature Date/Time: 05/16/2019/9:59:22 AM    Final        ______________________________________________________________________________________________      Risk Assessment/Calculations:   {Does this patient have ATRIAL FIBRILLATION?:[phone]} No BP recorded.  {Refresh Note OR Click here to enter BP  :1}***        Physical Exam:     VS:  There were no vitals taken for this visit. ***    Wt Readings from Last 3 Encounters:  08/11/24 210 lb (95.3 kg)  07/09/24 220 lb (99.8 kg)  07/05/24 220 lb 3.2 oz (99.9 kg)     GEN: Well nourished, well developed, in no acute distress NECK: No JVD; No carotid bruits CARDIAC: ***RRR, no murmurs, rubs, gallops RESPIRATORY:  Clear to auscultation without rales, wheezing or rhonchi  ABDOMEN: Soft, non-tender, non-distended, normal bowel sounds EXTREMITIES:  Warm and well perfused, no edema; No deformity, 2+ radial pulses PSYCH: Normal mood and affect   Assessment & Plan HFrEF (heart failure with reduced ejection fraction) (HCC)  Permanent atrial fibrillation (HCC)  Coronary artery disease involving native coronary artery of native heart without angina pectoris  Abnormal ankle brachial  index (ABI)  Dyslipidemia Increase Crestor  vs Zetia*** HS-CRP      {Are you ordering a CV Procedure (e.g. stress test, cath, DCCV, TEE, etc)?   Press F2        :[id number]}   This note was written with the assistance of a dictation microphone or AI dictation software. Please excuse any typos or grammatical errors.   Signed, Georganna Archer, MD 09/07/2024 10:21 PM    [facility]  "

## 2024-09-07 NOTE — Assessment & Plan Note (Signed)
 Increase Crestor  vs Zetia*** HS-CRP

## 2024-09-08 ENCOUNTER — Ambulatory Visit
Attending: Student in an Organized Health Care Education/Training Program | Admitting: Student in an Organized Health Care Education/Training Program

## 2024-09-08 ENCOUNTER — Encounter: Payer: Self-pay | Admitting: Student in an Organized Health Care Education/Training Program

## 2024-09-08 VITALS — BP 100/66 | HR 79 | Ht 71.0 in | Wt 211.0 lb

## 2024-09-08 DIAGNOSIS — I5020 Unspecified systolic (congestive) heart failure: Secondary | ICD-10-CM | POA: Diagnosis not present

## 2024-09-08 DIAGNOSIS — E785 Hyperlipidemia, unspecified: Secondary | ICD-10-CM | POA: Diagnosis not present

## 2024-09-08 DIAGNOSIS — I2510 Atherosclerotic heart disease of native coronary artery without angina pectoris: Secondary | ICD-10-CM

## 2024-09-08 DIAGNOSIS — I4821 Permanent atrial fibrillation: Secondary | ICD-10-CM | POA: Diagnosis not present

## 2024-09-08 DIAGNOSIS — R6889 Other general symptoms and signs: Secondary | ICD-10-CM

## 2024-09-08 LAB — CBC
Hematocrit: 45.3 % (ref 37.5–51.0)
Hemoglobin: 14.9 g/dL (ref 13.0–17.7)
MCH: 32.5 pg (ref 26.6–33.0)
MCHC: 32.9 g/dL (ref 31.5–35.7)
MCV: 99 fL — ABNORMAL HIGH (ref 79–97)
Platelets: 309 x10E3/uL (ref 150–450)
RBC: 4.58 x10E6/uL (ref 4.14–5.80)
RDW: 12.5 % (ref 11.6–15.4)
WBC: 8.6 x10E3/uL (ref 3.4–10.8)

## 2024-09-08 NOTE — Assessment & Plan Note (Signed)
-   In permanent atrial fibrillation and is rate controlled.  No changes. Continue Eliquis  5 mg twice daily Continue carvedilol  for rate control

## 2024-09-08 NOTE — Assessment & Plan Note (Signed)
-   Has known severe ISR of a prior LAD stent that was not revascularized due to perceived nonviability. -He has not actually undergone a viability study to verify this finding. -We will obtain a cardiac MRI as above once deemed safe from ENT Viability study pending safety of MRI assessment
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
## 2024-09-08 NOTE — Patient Instructions (Signed)
 Medication Instructions:  Your physician recommends that you continue on your current medications as directed. Please refer to the Current Medication list given to you today.  *If you need a refill on your cardiac medications before your next appointment, please call your pharmacy*  Lab Work: Today- CBC, Pro BNP If you have labs (blood work) drawn today and your tests are completely normal, you will receive your results only by: MyChart Message (if you have MyChart) OR A paper copy in the mail If you have any lab test that is abnormal or we need to change your treatment, we will call you to review the results.   Follow-Up: At [facility], you and your health needs are our priority.  As part of our continuing mission to provide you with exceptional heart care, our providers are all part of one team.  This team includes your primary Cardiologist (physician) and Advanced Practice Providers or APPs (Physician Assistants and Nurse Practitioners) who all work together to provide you with the care you need, when you need it.  Your next appointment:   3 month(s)  Provider:   Georganna Archer, MD    We recommend signing up for the patient portal called MyChart.  Sign up information is provided on this After Visit Summary.  MyChart is used to connect with patients for Virtual Visits (Telemedicine).  Patients are able to view lab/test results, encounter notes, upcoming appointments, etc.  Non-urgent messages can be sent to your provider as well.   To learn more about what you can do with MyChart, go to [email or web address].   Other Instructions Please notify Dr. Archer regarding your discussion with Dr. Nellene about MRI.

## 2024-09-15 ENCOUNTER — Ambulatory Visit: Payer: Self-pay | Admitting: Student in an Organized Health Care Education/Training Program

## 2024-09-15 DIAGNOSIS — I5023 Acute on chronic systolic (congestive) heart failure: Secondary | ICD-10-CM

## 2024-09-15 DIAGNOSIS — I5022 Chronic systolic (congestive) heart failure: Secondary | ICD-10-CM

## 2024-09-23 LAB — LAB REPORT - SCANNED
A1c: 6.5
Albumin, Urine POC: 4.1
Creatinine, POC: 131.4 mg/dL
EGFR: 58
Microalb Creat Ratio: 3
TSH: 1.9 (ref 0.41–5.90)

## 2024-09-25 ENCOUNTER — Ambulatory Visit: Payer: Self-pay | Admitting: Student in an Organized Health Care Education/Training Program

## 2024-10-03 NOTE — Addendum Note (Signed)
 Addended by: MANDA BOTTCHER B on: 10/03/2024 09:24 AM   Modules accepted: Orders

## 2024-10-06 NOTE — Progress Notes (Signed)
 Spoke to daughter and she will look at patient's mychart to review instructions.

## 2024-10-12 ENCOUNTER — Encounter (HOSPITAL_COMMUNITY): Payer: Self-pay

## 2024-10-13 ENCOUNTER — Ambulatory Visit (HOSPITAL_COMMUNITY): Admission: RE | Admit: 2024-10-13

## 2024-10-13 ENCOUNTER — Other Ambulatory Visit: Payer: Self-pay | Admitting: Student in an Organized Health Care Education/Training Program

## 2024-10-13 DIAGNOSIS — I5022 Chronic systolic (congestive) heart failure: Secondary | ICD-10-CM

## 2024-10-13 MED ORDER — GADOBUTROL 1 MMOL/ML IV SOLN
12.0000 mL | Freq: Once | INTRAVENOUS | Status: AC | PRN
  Administered 2024-10-13: 12 mL via INTRAVENOUS

## 2024-11-09 ENCOUNTER — Ambulatory Visit: Payer: Medicare PPO

## 2025-02-09 ENCOUNTER — Ambulatory Visit (INDEPENDENT_AMBULATORY_CARE_PROVIDER_SITE_OTHER): Admitting: Otolaryngology
